# Patient Record
Sex: Female | Born: 1950 | Hispanic: No | Marital: Married | Smoking: Never smoker
Health system: Southern US, Community
[De-identification: ages and names within clinical notes are randomized; demographics above are authoritative.]

## PROBLEM LIST (undated history)

## (undated) DIAGNOSIS — D6862 Lupus anticoagulant syndrome: Secondary | ICD-10-CM

## (undated) DIAGNOSIS — R42 Dizziness and giddiness: Secondary | ICD-10-CM

## (undated) DIAGNOSIS — K469 Unspecified abdominal hernia without obstruction or gangrene: Secondary | ICD-10-CM

## (undated) DIAGNOSIS — K559 Vascular disorder of intestine, unspecified: Secondary | ICD-10-CM

## (undated) DIAGNOSIS — E119 Type 2 diabetes mellitus without complications: Secondary | ICD-10-CM

## (undated) DIAGNOSIS — E785 Hyperlipidemia, unspecified: Secondary | ICD-10-CM

## (undated) DIAGNOSIS — I749 Embolism and thrombosis of unspecified artery: Secondary | ICD-10-CM

## (undated) HISTORY — PX: NISSEN FUNDOPLICATION: SHX2091

## (undated) HISTORY — PX: ABDOMINAL SURGERY: SHX537

## (undated) HISTORY — PX: HERNIA REPAIR: SHX51

## (undated) HISTORY — PX: COLECTOMY WITH COLOSTOMY CREATION/HARTMANN PROCEDURE: SHX6598

## (undated) HISTORY — PX: COLOSTOMY TAKEDOWN: SHX5258

---

## 2017-12-24 ENCOUNTER — Inpatient Hospital Stay (HOSPITAL_COMMUNITY)
Admission: EM | Admit: 2017-12-24 | Discharge: 2017-12-27 | DRG: 194 | Disposition: A | Discharge status: Home / Self Care | Payer: Medicare Other | Attending: Internal Medicine | Admitting: Internal Medicine

## 2017-12-24 ENCOUNTER — Emergency Department (HOSPITAL_COMMUNITY): Payer: Medicare Other

## 2017-12-24 ENCOUNTER — Encounter (HOSPITAL_COMMUNITY): Payer: Self-pay | Admitting: *Deleted

## 2017-12-24 DIAGNOSIS — Z8719 Personal history of other diseases of the digestive system: Secondary | ICD-10-CM

## 2017-12-24 DIAGNOSIS — J154 Pneumonia due to other streptococci: Secondary | ICD-10-CM | POA: Diagnosis present

## 2017-12-24 DIAGNOSIS — Z7901 Long term (current) use of anticoagulants: Secondary | ICD-10-CM

## 2017-12-24 DIAGNOSIS — A419 Sepsis, unspecified organism: Secondary | ICD-10-CM

## 2017-12-24 DIAGNOSIS — E119 Type 2 diabetes mellitus without complications: Secondary | ICD-10-CM

## 2017-12-24 DIAGNOSIS — Z1630 Resistance to unspecified antimicrobial drugs: Secondary | ICD-10-CM | POA: Diagnosis present

## 2017-12-24 DIAGNOSIS — Z881 Allergy status to other antibiotic agents status: Secondary | ICD-10-CM

## 2017-12-24 DIAGNOSIS — K469 Unspecified abdominal hernia without obstruction or gangrene: Secondary | ICD-10-CM | POA: Diagnosis not present

## 2017-12-24 DIAGNOSIS — M546 Pain in thoracic spine: Secondary | ICD-10-CM | POA: Diagnosis not present

## 2017-12-24 DIAGNOSIS — E785 Hyperlipidemia, unspecified: Secondary | ICD-10-CM

## 2017-12-24 DIAGNOSIS — H9312 Tinnitus, left ear: Secondary | ICD-10-CM | POA: Diagnosis not present

## 2017-12-24 DIAGNOSIS — Z23 Encounter for immunization: Secondary | ICD-10-CM

## 2017-12-24 DIAGNOSIS — Z79899 Other long term (current) drug therapy: Secondary | ICD-10-CM

## 2017-12-24 DIAGNOSIS — D6861 Antiphospholipid syndrome: Secondary | ICD-10-CM | POA: Diagnosis present

## 2017-12-24 DIAGNOSIS — R740 Nonspecific elevation of levels of transaminase and lactic acid dehydrogenase [LDH]: Secondary | ICD-10-CM | POA: Diagnosis not present

## 2017-12-24 DIAGNOSIS — R091 Pleurisy: Secondary | ICD-10-CM | POA: Diagnosis not present

## 2017-12-24 DIAGNOSIS — Z8611 Personal history of tuberculosis: Secondary | ICD-10-CM | POA: Diagnosis not present

## 2017-12-24 DIAGNOSIS — J181 Lobar pneumonia, unspecified organism: Secondary | ICD-10-CM

## 2017-12-24 DIAGNOSIS — Z91040 Latex allergy status: Secondary | ICD-10-CM | POA: Diagnosis not present

## 2017-12-24 DIAGNOSIS — J918 Pleural effusion in other conditions classified elsewhere: Secondary | ICD-10-CM

## 2017-12-24 DIAGNOSIS — Z7984 Long term (current) use of oral hypoglycemic drugs: Secondary | ICD-10-CM | POA: Diagnosis not present

## 2017-12-24 DIAGNOSIS — Z885 Allergy status to narcotic agent status: Secondary | ICD-10-CM

## 2017-12-24 DIAGNOSIS — I10 Essential (primary) hypertension: Secondary | ICD-10-CM

## 2017-12-24 DIAGNOSIS — K559 Vascular disorder of intestine, unspecified: Secondary | ICD-10-CM | POA: Diagnosis present

## 2017-12-24 DIAGNOSIS — J189 Pneumonia, unspecified organism: Secondary | ICD-10-CM | POA: Insufficient documentation

## 2017-12-24 DIAGNOSIS — H9202 Otalgia, left ear: Secondary | ICD-10-CM | POA: Diagnosis not present

## 2017-12-24 HISTORY — DX: Embolism and thrombosis of unspecified artery: I74.9

## 2017-12-24 HISTORY — DX: Dizziness and giddiness: R42

## 2017-12-24 HISTORY — DX: Hyperlipidemia, unspecified: E78.5

## 2017-12-24 HISTORY — DX: Vascular disorder of intestine, unspecified: K55.9

## 2017-12-24 HISTORY — DX: Type 2 diabetes mellitus without complications: E11.9

## 2017-12-24 HISTORY — DX: Unspecified abdominal hernia without obstruction or gangrene: K46.9

## 2017-12-24 LAB — URINALYSIS, ROUTINE W REFLEX MICROSCOPIC
Bilirubin Urine: NEGATIVE
Glucose, UA: >=500 mg/dL — AB
Hgb urine dipstick: NEGATIVE
KETONES UR: 80 mg/dL — AB
Leukocytes, UA: NEGATIVE
Nitrite: NEGATIVE
PROTEIN: 100 mg/dL — AB
Specific Gravity, Urine: 1.028 (ref 1.005–1.030)
pH: 5 (ref 5.0–8.0)

## 2017-12-24 LAB — CBC WITH DIFFERENTIAL/PLATELET
BASOS PCT: 1 %
Basophils Absolute: 0.3 10*3/uL — ABNORMAL HIGH (ref 0.0–0.1)
EOS PCT: 0 %
Eosinophils Absolute: 0 10*3/uL (ref 0.0–0.7)
HCT: 42.2 % (ref 36.0–46.0)
HEMOGLOBIN: 13.1 g/dL (ref 12.0–15.0)
Lymphocytes Relative: 2 %
Lymphs Abs: 0.5 10*3/uL — ABNORMAL LOW (ref 0.7–4.0)
MCH: 24.7 pg — AB (ref 26.0–34.0)
MCHC: 31 g/dL (ref 30.0–36.0)
MCV: 79.5 fL (ref 78.0–100.0)
Monocytes Absolute: 0.8 10*3/uL (ref 0.1–1.0)
Monocytes Relative: 3 %
NEUTROS ABS: 24.2 10*3/uL — AB (ref 1.7–7.7)
NEUTROS PCT: 94 %
PLATELETS: 301 10*3/uL (ref 150–400)
RBC: 5.31 MIL/uL — ABNORMAL HIGH (ref 3.87–5.11)
RDW: 17.9 % — ABNORMAL HIGH (ref 11.5–15.5)
WBC: 25.8 10*3/uL — ABNORMAL HIGH (ref 4.0–10.5)

## 2017-12-24 LAB — COMPREHENSIVE METABOLIC PANEL
ALT: 93 U/L — AB (ref 0–44)
AST: 101 U/L — AB (ref 15–41)
Albumin: 2.9 g/dL — ABNORMAL LOW (ref 3.5–5.0)
Alkaline Phosphatase: 218 U/L — ABNORMAL HIGH (ref 38–126)
Anion gap: 13 (ref 5–15)
BUN: 10 mg/dL (ref 8–23)
CHLORIDE: 97 mmol/L — AB (ref 98–111)
CO2: 23 mmol/L (ref 22–32)
CREATININE: 1.01 mg/dL — AB (ref 0.44–1.00)
Calcium: 8.9 mg/dL (ref 8.9–10.3)
GFR calc Af Amer: >60 mL/min (ref >60)
GFR, EST NON AFRICAN AMERICAN: 56 mL/min — AB (ref >60)
Glucose, Bld: 145 mg/dL — ABNORMAL HIGH (ref 70–99)
Potassium: 4.2 mmol/L (ref 3.5–5.1)
Sodium: 133 mmol/L — ABNORMAL LOW (ref 135–145)
Total Bilirubin: 1.6 mg/dL — ABNORMAL HIGH (ref 0.3–1.2)
Total Protein: 7.3 g/dL (ref 6.5–8.1)

## 2017-12-24 LAB — I-STAT CG4 LACTIC ACID, ED: LACTIC ACID, VENOUS: 1.68 mmol/L (ref 0.5–1.9)

## 2017-12-24 LAB — GLUCOSE, CAPILLARY: Glucose-Capillary: 190 mg/dL — ABNORMAL HIGH (ref 70–99)

## 2017-12-24 LAB — PROTIME-INR
INR: 2.11
Prothrombin Time: 23.5 seconds — ABNORMAL HIGH (ref 11.4–15.2)

## 2017-12-24 LAB — HEMOGLOBIN A1C
HEMOGLOBIN A1C: 7.8 % — AB (ref 4.8–5.6)
Mean Plasma Glucose: 177.16 mg/dL

## 2017-12-24 LAB — CBG MONITORING, ED: Glucose-Capillary: 80 mg/dL (ref 70–99)

## 2017-12-24 LAB — D-DIMER, QUANTITATIVE (NOT AT ARMC): D DIMER QUANT: 5.7 ug{FEU}/mL — AB (ref 0.00–0.50)

## 2017-12-24 MED ORDER — SODIUM CHLORIDE 0.9 % IV SOLN
1.0000 g | Freq: Once | INTRAVENOUS | Status: AC
  Administered 2017-12-24: 1 g via INTRAVENOUS
  Filled 2017-12-24: qty 10

## 2017-12-24 MED ORDER — WARFARIN SODIUM 5 MG PO TABS
5.0000 mg | ORAL_TABLET | Freq: Once | ORAL | Status: AC
  Administered 2017-12-24: 5 mg via ORAL
  Filled 2017-12-24: qty 1

## 2017-12-24 MED ORDER — SODIUM CHLORIDE 0.9 % IV SOLN
1000.0000 mL | INTRAVENOUS | Status: DC
  Administered 2017-12-24: 1000 mL via INTRAVENOUS

## 2017-12-24 MED ORDER — PANTOPRAZOLE SODIUM 40 MG PO TBEC
40.0000 mg | DELAYED_RELEASE_TABLET | Freq: Every day | ORAL | Status: DC
  Administered 2017-12-25 – 2017-12-27 (×3): 40 mg via ORAL
  Filled 2017-12-24 (×3): qty 1

## 2017-12-24 MED ORDER — IOPAMIDOL (ISOVUE-370) INJECTION 76%
INTRAVENOUS | Status: AC
  Filled 2017-12-24: qty 100

## 2017-12-24 MED ORDER — PRAVASTATIN SODIUM 40 MG PO TABS
40.0000 mg | ORAL_TABLET | Freq: Every day | ORAL | Status: DC
  Administered 2017-12-24 – 2017-12-26 (×3): 40 mg via ORAL
  Filled 2017-12-24 (×3): qty 1

## 2017-12-24 MED ORDER — IOPAMIDOL (ISOVUE-370) INJECTION 76%
100.0000 mL | Freq: Once | INTRAVENOUS | Status: AC | PRN
  Administered 2017-12-24: 100 mL via INTRAVENOUS

## 2017-12-24 MED ORDER — VENLAFAXINE HCL ER 150 MG PO CP24
150.0000 mg | ORAL_CAPSULE | Freq: Every day | ORAL | Status: DC
  Administered 2017-12-25 – 2017-12-27 (×3): 150 mg via ORAL
  Filled 2017-12-24 (×3): qty 1

## 2017-12-24 MED ORDER — INSULIN ASPART 100 UNIT/ML ~~LOC~~ SOLN
0.0000 [IU] | Freq: Every day | SUBCUTANEOUS | Status: DC
  Administered 2017-12-25: 2 [IU] via SUBCUTANEOUS

## 2017-12-24 MED ORDER — LEVALBUTEROL HCL 0.63 MG/3ML IN NEBU: 0.6300 mg | INHALATION_SOLUTION | Freq: Four times a day (QID) | RESPIRATORY_TRACT | Status: DC | PRN

## 2017-12-24 MED ORDER — SODIUM CHLORIDE 0.9 % IV SOLN
500.0000 mg | Freq: Once | INTRAVENOUS | Status: AC
  Administered 2017-12-24: 500 mg via INTRAVENOUS
  Filled 2017-12-24: qty 500

## 2017-12-24 MED ORDER — LACTATED RINGERS IV BOLUS
1000.0000 mL | Freq: Once | INTRAVENOUS | Status: AC
  Administered 2017-12-24: 1000 mL via INTRAVENOUS

## 2017-12-24 MED ORDER — SODIUM CHLORIDE 0.9 % IV BOLUS
1000.0000 mL | Freq: Once | INTRAVENOUS | Status: AC
  Administered 2017-12-24: 1000 mL via INTRAVENOUS

## 2017-12-24 MED ORDER — SENNOSIDES-DOCUSATE SODIUM 8.6-50 MG PO TABS
1.0000 | ORAL_TABLET | Freq: Two times a day (BID) | ORAL | Status: DC
  Administered 2017-12-24 – 2017-12-26 (×5): 1 via ORAL
  Filled 2017-12-24 (×6): qty 1

## 2017-12-24 MED ORDER — ONDANSETRON HCL 4 MG/2ML IJ SOLN: 4.0000 mg | Freq: Four times a day (QID) | INTRAMUSCULAR | Status: DC | PRN

## 2017-12-24 MED ORDER — WARFARIN - PHARMACIST DOSING INPATIENT
Freq: Every day | Status: DC
  Administered 2017-12-25: 18:00:00

## 2017-12-24 MED ORDER — INSULIN ASPART 100 UNIT/ML ~~LOC~~ SOLN
0.0000 [IU] | Freq: Three times a day (TID) | SUBCUTANEOUS | Status: DC
  Administered 2017-12-25 (×2): 3 [IU] via SUBCUTANEOUS
  Administered 2017-12-26: 5 [IU] via SUBCUTANEOUS
  Administered 2017-12-26 – 2017-12-27 (×2): 3 [IU] via SUBCUTANEOUS
  Administered 2017-12-27: 7 [IU] via SUBCUTANEOUS

## 2017-12-24 MED ORDER — KETOROLAC TROMETHAMINE 30 MG/ML IJ SOLN
15.0000 mg | Freq: Once | INTRAMUSCULAR | Status: AC
  Administered 2017-12-24: 15 mg via INTRAMUSCULAR
  Filled 2017-12-24: qty 1

## 2017-12-24 MED ORDER — PIPERACILLIN-TAZOBACTAM 3.375 G IVPB
3.3750 g | Freq: Three times a day (TID) | INTRAVENOUS | Status: DC
  Administered 2017-12-24 – 2017-12-27 (×9): 3.375 g via INTRAVENOUS
  Filled 2017-12-24 (×10): qty 50

## 2017-12-24 MED ORDER — ONDANSETRON HCL 4 MG PO TABS: 4.0000 mg | ORAL_TABLET | Freq: Four times a day (QID) | ORAL | Status: DC | PRN

## 2017-12-24 MED ORDER — INSULIN DETEMIR 100 UNIT/ML ~~LOC~~ SOLN
10.0000 [IU] | Freq: Every day | SUBCUTANEOUS | Status: DC
  Administered 2017-12-24 – 2017-12-26 (×3): 10 [IU] via SUBCUTANEOUS
  Filled 2017-12-24 (×4): qty 0.1

## 2017-12-24 MED ORDER — BENZONATATE 100 MG PO CAPS
100.0000 mg | ORAL_CAPSULE | Freq: Once | ORAL | Status: AC
  Administered 2017-12-24: 100 mg via ORAL
  Filled 2017-12-24: qty 1

## 2017-12-24 MED ORDER — DEXTROSE 5 % IV SOLN
250.0000 mg | INTRAVENOUS | Status: DC
  Administered 2017-12-25 – 2017-12-27 (×3): 250 mg via INTRAVENOUS
  Filled 2017-12-24 (×3): qty 250

## 2017-12-24 MED ORDER — IBUPROFEN 800 MG PO TABS
800.0000 mg | ORAL_TABLET | Freq: Three times a day (TID) | ORAL | Status: DC
  Administered 2017-12-24 – 2017-12-27 (×7): 800 mg via ORAL
  Filled 2017-12-24 (×3): qty 1
  Filled 2017-12-24: qty 4
  Filled 2017-12-24 (×4): qty 1

## 2017-12-24 MED ORDER — ACETAMINOPHEN 325 MG PO TABS
650.0000 mg | ORAL_TABLET | Freq: Once | ORAL | Status: AC
  Administered 2017-12-24: 650 mg via ORAL
  Filled 2017-12-24: qty 2

## 2017-12-24 NOTE — ED Notes (Signed)
Pt at xray. Xray will transport pt back to B[facility]

## 2017-12-24 NOTE — ED Notes (Signed)
Pt reports last Tylenol, 1000 mg at 5 am today.

## 2017-12-24 NOTE — ED Notes (Signed)
This RN attempted IV, unable to obtain. Pt reports that she usually needs US IV, will place IV Team consult.

## 2017-12-24 NOTE — ED Provider Notes (Signed)
[facility] EMERGENCY DEPARTMENT Provider Note   CSN: [id number] Arrival date & time: 12/24/17  4098     History   Chief Complaint Chief Complaint  Patient presents with  . Cough  . Fever    HPI Terri Morales is a 67 y.o. female.  HPI  Terri Morales is a 67 y.o. 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  Patient states that she is not sure if symptoms related to this but she had a bad fall 6 days ago where she hit her head on the wall and fell onto her right shoulder and bruising her shoulder, bilateral legs, head.  She denies loss of consciousness.  She states she was in minimal pain at that time, but pain got worse the next day.  She states pain was "everywhere."  She also states that the day after, approximately 4 days ago she started running a fever.  The day before that she drove from [place] here which is approximately 12 to 13-hour drive.  She states that she has also now developed cough and congestion in the last 2 days.  She has been taking Tylenol for her fever and pain.  She states that currently her pain is in her chest, abdomen, back.  She states she has been weak, she has not been out of the house due to not feeling well.  Did not take any of her morning medications but did take some Tylenol.  She states fever up to 102 at home.   Past Medical History:  Diagnosis Date  . Abdominal hernia   . Diabetes mellitus without complication (HCC)   . Embolism (HCC)   . Hyperlipemia   . Ischemic colitis (HCC)   . Vertigo     There are no active problems to display for this patient.   Past Surgical History:  Procedure Laterality Date  . ABDOMINAL SURGERY       OB History   None      Home Medications    Prior to Admission medications   Not on File    Family History History reviewed. No pertinent family history.  Social  History Social History   Tobacco Use  . Smoking status: Never Smoker  . Smokeless tobacco: Never Used  Substance Use Topics  . Alcohol use: Not on file  . Drug use: Not on file     Allergies   Morphine and related   Review of Systems Review of Systems  Constitutional: Positive for chills and fever.  Respiratory: Positive for cough, chest tightness and shortness of breath.   Cardiovascular: Positive for chest pain. Negative for palpitations and leg swelling.  Gastrointestinal: Positive for abdominal pain. Negative for diarrhea, nausea and vomiting.  Genitourinary: Negative for dysuria, flank pain, pelvic pain, vaginal bleeding, vaginal discharge and vaginal pain.  Musculoskeletal: Positive for arthralgias, back pain and myalgias. Negative for neck pain and neck stiffness.  Skin: Negative for rash.  Neurological: Positive for headaches. Negative for dizziness and weakness.  All other systems reviewed and are negative.    Physical Exam Updated Vital Signs BP (!) 103/58 (BP Location: Right Arm)   Pulse (!) 122   Temp 98.4 F (36.9 C) (Oral)   Resp 20   SpO2 95%   Physical Exam  Constitutional: She appears well-developed and well-nourished. No distress.  HENT:  Head: Normocephalic.  Eyes: Conjunctivae are normal.  Neck: Neck supple.  Cardiovascular: Normal  rate, regular rhythm and normal heart sounds.  Pulmonary/Chest: Effort normal and breath sounds normal. No respiratory distress. She has no wheezes. She has no rales.  Abdominal: Soft. Bowel sounds are normal. She exhibits no distension. There is no tenderness. There is no rebound.  Musculoskeletal: She exhibits no edema.  Neurological: She is alert.  Skin: Skin is warm and dry.  Multiple contusions, one contusion to the right anterior shoulder.  Contusions to bilateral shins.  No bony tenderness.  Psychiatric: She has a normal mood and affect. Her behavior is normal.  Nursing note and vitals reviewed.    ED  Treatments / Results  Labs (all labs ordered are listed, but only abnormal results are displayed) Labs Reviewed  COMPREHENSIVE METABOLIC PANEL - Abnormal; Notable for the following components:      Result Value   Sodium 133 (*)    Chloride 97 (*)    Glucose, Bld 145 (*)    Creatinine, Ser 1.01 (*)    Albumin 2.9 (*)    AST 101 (*)    ALT 93 (*)    Alkaline Phosphatase 218 (*)    Total Bilirubin 1.6 (*)    GFR calc non Af Amer 56 (*)    All other components within normal limits  CBC WITH DIFFERENTIAL/PLATELET - Abnormal; Notable for the following components:   WBC 25.8 (*)    RBC 5.31 (*)    MCH 24.7 (*)    RDW 17.9 (*)    Neutro Abs 24.2 (*)    Lymphs Abs 0.5 (*)    Basophils Absolute 0.3 (*)    All other components within normal limits  URINALYSIS, ROUTINE W REFLEX MICROSCOPIC - Abnormal; Notable for the following components:   APPearance HAZY (*)    Glucose, UA >=500 (*)    Ketones, ur 80 (*)    Protein, ur 100 (*)    Bacteria, UA RARE (*)    All other components within normal limits  PROTIME-INR - Abnormal; Notable for the following components:   Prothrombin Time 23.5 (*)    All other components within normal limits  D-DIMER, QUANTITATIVE (NOT AT [facility]) - Abnormal; Notable for the following components:   D-Dimer, Quant 5.70 (*)    All other components within normal limits  CULTURE, BLOOD (ROUTINE X 2)  CULTURE, BLOOD (ROUTINE X 2)  I-STAT CG4 LACTIC ACID, ED    EKG EKG Interpretation  Date/Time:  Friday December 24 2017 12:05:46 EDT Ventricular Rate:  114 PR Interval:    QRS Duration: 85 QT Interval:  317 QTC Calculation: 437 R Axis:   70 Text Interpretation:  Sinus tachycardia Borderline repolarization abnormality agree. no acute ischemic changes Confirmed by Arby Barrette [id number]) on 12/24/2017 3:10:11 PM   Radiology Dg Chest 2 View  Result Date: 12/24/2017 CLINICAL DATA:  66 year old female with a history of cough and chest pain EXAM: CHEST - 2 VIEW  COMPARISON:  None. FINDINGS: Cardiomediastinal silhouette within normal limits. No evidence of central vascular congestion. Airspace opacity of the left posterior lower lobe with air bronchograms. No pleural effusion. No pneumothorax. No displaced fracture. IMPRESSION: Lobar pneumonia of the left lower lobe Electronically Signed   By: Gilmer Mor D.O.   On: 12/24/2017 09:42   Ct Angio Chest Pe W And/or Wo Contrast  Result Date: 12/24/2017 CLINICAL DATA:  Pneumonia.  Elevated D-dimer. EXAM: CT ANGIOGRAPHY CHEST WITH CONTRAST TECHNIQUE: Multidetector CT imaging of the chest was performed using the standard protocol during bolus administration of intravenous contrast.  Multiplanar CT image reconstructions and MIPs were obtained to evaluate the vascular anatomy. CONTRAST:  ISOVUE-370 IOPAMIDOL (ISOVUE-370) INJECTION 76%, <See Chart> ISOVUE-370 IOPAMIDOL (ISOVUE-370) INJECTION 76% COMPARISON:  Chest x-ray from same day. FINDINGS: Cardiovascular: Satisfactory opacification of the pulmonary arteries to the segmental level. No evidence of pulmonary embolism. Normal heart size. No pericardial effusion. Normal caliber thoracic aorta. Thoracic aortic atherosclerosis. Mediastinum/Nodes: Enlarged para-aortic and subcarinal lymph nodes measuring up to 1.5 cm in short axis, favored reactive. No axillary or hilar lymphadenopathy. The thyroid gland, trachea, and esophagus demonstrate no significant findings. Lungs/Pleura: Patchy consolidation involving the majority of the left lower lobe. Moderate left pleural effusion. There are a few ground-glass densities in the right lower lobe. No pneumothorax. No suspicious pulmonary nodule. Upper Abdomen: No acute abnormality. Musculoskeletal: No chest wall abnormality. No acute or significant osseous findings. Review of the MIP images confirms the above findings. IMPRESSION: 1. Left lower lobar pneumonia. Mild bronchopneumonia of the right lower lobe. Followup PA and lateral chest  X-ray is recommended in 3-4 weeks following trial of antibiotic therapy to ensure resolution. 2.  No evidence of pulmonary embolism. 3. Moderate left pleural effusion. 4.  Aortic atherosclerosis (ICD10-I70.0). Electronically Signed   By: Obie Dredge M.D.   On: 12/24/2017 14:22    Procedures .Critical Care Performed by: Jaynie Crumble, PA-C Authorized by: Jaynie Crumble, PA-C   Critical care provider statement:    Critical care time (minutes):  45   Critical care was necessary to treat or prevent imminent or life-threatening deterioration of the following conditions:  Respiratory failure and sepsis   Critical care was time spent personally by me on the following activities:  Discussions with consultants, evaluation of patient's response to treatment, examination of patient, ordering and performing treatments and interventions, ordering and review of laboratory studies, ordering and review of radiographic studies, pulse oximetry, re-evaluation of patient's condition, obtaining history from patient or surrogate and review of old charts   I assumed direction of critical care for this patient from another provider in my specialty: no     (including critical care time)   Medications Ordered in ED Medications  sodium chloride 0.9 % bolus 1,000 mL (has no administration in time range)     Initial Impression / Assessment and Plan / ED Course  I have reviewed the triage vital signs and the nursing notes.  Pertinent labs & imaging results that were available during my care of the patient were reviewed by me and considered in my medical decision making (see chart for details).     Patient with cough, congestion, fevers up to 102 at home, body aches, chest pain for the last 4 days.  Also had travel with 13 hours by car 5 days ago.  History of ischemic colitis on Coumadin.  Will check labs including INR and get a d-dimer.  Will get a chest x-ray.  I will start IV fluids.  Patient is  tachycardic, however normal blood pressure, temperature, respiratory rate and oxygen saturation.  Does not meet Sirs criteria.  I will however draw blood cultures given that she did have a fever of 102 at home.  I will order her some Tessalon for cough.  Her lungs are clear.  Will monitor.  11:09 AM Patient is now tachypneic and febrile at 100.9.  She meets sirs criteria.  Her white blood cell count is 25.8.  She already had blood cultures drawn, I will add additional fluids and antibiotics to cover for pneumonia.  Chest  x-ray does show pneumonia.  Her lactic acid is normal at 1.68.  Sepsis - Repeat Assessment  Performed at:    2:42 PM   Vitals     Blood pressure (!) 113/36, pulse (!) 113, temperature 99.9 F (37.7 C), temperature source Oral, resp. rate (!) 25, height 4\' 10"  (1.473 m), weight 74.4 kg, SpO2 93 %.  Heart:     Regular rate and rhythm and Tachycardic  Lungs:    Wheezing  Capillary Refill:   <2 sec  Peripheral Pulse:   Radial pulse palpable and Dorsalis pedis pulse  palpable  Skin:     Dry  2:43 PM Patient continues to improve.  Heart rate is in the low 100s.  States that she is feeling slightly improved.  Her CT angios negative for PE.  I spoke with admitting team, teaching service, they will come by and admit patient.  I did discuss with them whether they want to change her antibiotics since she now is stating that she was in the hospital in June for small bowel obstruction.  They will make that decision once they see her.  Vitals:   12/24/17 1256 12/24/17 1300 12/24/17 1315 12/24/17 1415  BP:  (!) 132/54 (!) 122/54 (!) 113/36  Pulse:  (!) 117 (!) 113 (!) 113  Resp:  (!) 26 (!) 25 (!) 25  Temp: 99.9 F (37.7 C)     TempSrc: Oral     SpO2:  93% 94% 93%  Weight:      Height:         Final Clinical Impressions(s) / ED Diagnoses   Final diagnoses:  Sepsis, due to unspecified organism [facility](HCC)  Community acquired pneumonia of left lower lobe of lung [facility](HCC)    ED  Discharge Orders    None       Jaynie CrumbleKirichenko, Joleene Burnham, PA-C 12/24/17 1524    Arby BarrettePfeiffer, Marcy, MD 12/27/17 [phone]

## 2017-12-24 NOTE — ED Triage Notes (Signed)
Pt in c/o cough, congestion and fever that started a few days ago, pt also reports long car ride of 10 hours on Monday, no distress in triage

## 2017-12-24 NOTE — H&P (Addendum)
Date: 12/24/2017               Patient Name:  Terri Morales MRN: [id number]  DOB: Jan 25, 1951 Age / Sex: 67 y.o., female   PCP: Patient, No Pcp Per         Medical Service: Internal Medicine Teaching Service         Attending Physician: Dr. Doneen Poisson, MD    First Contact: Dr. Karilyn Cota, Gerell Fortson Pager: [phone]  Second Contact: Dr. Levora Dredge Pager: [phone]       After Hours (After 5p/  First Contact Pager: [phone]  weekends / holidays): Second Contact Pager: [phone]   Chief Complaint: Cough with fevers  History of Present Illness:   Terri Morales is a 67 y.o female with a PMHx of HTN, HLD, DM, ischemic colitis on coumadin, and abdominal hernias presenting with a five day history of productive cough with fevers.  She started having fevers, chills and cough on Monday with the highest reported temperature of 102 F. She started taking tylenol but noticed the fevers kept returning and symptoms progressed throughout the week. She also reported centralized chest pain that worsened with breathing. She reported her cough was clear in color, denied any trouble breathing or swallowing. Denied N/V. She also reported left sided back pain on her ribs. She said this started in the hospital and worsened when she was moved from the bed to the CT scanner. She said it worsened with movement.  She is visiting her son from [place] and planned to stay til 12/29/17. She drove down from [place], ~12-13 hour drive and noticed her symptoms started the next day. 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 She denied any recent traveling or sick contacts; her mother in law has a chronic cough but is not sick. She also denied any prior history of COPD or asthma, never smoked. She reported she was recently admitted to  the hospital in June of this year due to an abdominal hernia. She denied receiving any antibiotics at that time.   In the ED, she had a BP 103/58, pulse 122, RR 20, fever of 100.9, and WBC 25.8. Lactic acid 1.68, INR 2.11, PT 23.5, D-Dimer 5.70. CXR showed left lower lobe pneumonia. CT angio chest showed no evidence of PE, left lower lobe pneumonia, mild bronchopneumonia of the right lower lobe, and moderate left pleural effusion. Patient was started on azithromycin and ceftriaxone and admitted to the IMT[facility] for further management.   Meds:  Current Meds  Medication Sig  . acetaminophen (TYLENOL) 500 MG tablet Take 1,000 mg by mouth every 6 (six) hours as needed for mild pain.  . Cholecalciferol (VITAMIN D3) 5000 units TABS Take 1,000 Units by mouth daily.  [name] gabapentin (NEURONTIN) 100 MG capsule Take 200 mg by mouth at bedtime.  [name] JANUVIA 100 MG tablet Take 100 mg by mouth daily.  [name] JARDIANCE 10 MG TABS tablet Take 10 mg by mouth daily.  . meclizine (ANTIVERT) 12.5 MG tablet Take 12.5 mg by mouth 3 (three) times daily as needed for dizziness.  . Melatonin 10 MG TABS Take 10 mg by mouth at bedtime as needed for sleep.  . Multiple Vitamin (MULTI-VITAMINS) TABS Take 1 tablet by mouth daily.  . Nutritional Supplements (DHEA PO) Take  1 tablet by mouth daily.  . ondansetron (ZOFRAN-ODT) 4 MG disintegrating tablet Take 4 mg by mouth every 6 (six) hours as needed for nausea/vomiting.  . pravastatin (PRAVACHOL) 40 MG tablet Take 40 mg by mouth at bedtime.  . senna (SENOKOT) 8.6 MG tablet Take 1 tablet by mouth at bedtime as needed for constipation.  [name] venlafaxine XR (EFFEXOR-XR) 150 MG 24 hr capsule Take 150 mg by mouth daily.  [name] warfarin (COUMADIN) 2.5 MG tablet Take 5 mg by mouth every evening. Current dose    Current Facility-Administered Medications (Endocrine & Metabolic):  .  insulin aspart (novoLOG) injection 0-15 Units .  insulin aspart (novoLOG) injection 0-5 Units .  insulin detemir  (LEVEMIR) injection 10 Units  Current Outpatient Medications (Endocrine & Metabolic):  [name]  JANUVIA 100 MG tablet, Take 100 mg by mouth daily. [name]  JARDIANCE 10 MG TABS tablet, Take 10 mg by mouth daily.  Current Facility-Administered Medications (Cardiovascular):  .  pravastatin (PRAVACHOL) tablet 40 mg  Current Outpatient Medications (Cardiovascular):  .  pravastatin (PRAVACHOL) 40 MG tablet, Take 40 mg by mouth at bedtime.    Current Facility-Administered Medications (Analgesics):  .  ibuprofen (ADVIL,MOTRIN) tablet 800 mg .  ketorolac (TORADOL) 30 MG/ML injection 15 mg  Current Outpatient Medications (Analgesics):  .  acetaminophen (TYLENOL) 500 MG tablet, Take 1,000 mg by mouth every 6 (six) hours as needed for mild pain.  Current Facility-Administered Medications (Hematological):  .  warfarin (COUMADIN) tablet 5 mg  Current Outpatient Medications (Hematological):  .  warfarin (COUMADIN) 2.5 MG tablet, Take 5 mg by mouth every evening. Current dose  Current Facility-Administered Medications (Other):  .  0.9 %  sodium chloride infusion .  [START ON 12/25/2017] azithromycin (ZITHROMAX) 250 mg in dextrose 5 % 125 mL IVPB .  iopamidol (ISOVUE-370) 76 % injection .  ondansetron (ZOFRAN) tablet 4 mg **OR** ondansetron (ZOFRAN) injection 4 mg .  pantoprazole (PROTONIX) EC tablet 40 mg .  piperacillin-tazobactam (ZOSYN) IVPB 3.375 g .  senna-docusate (Senokot-S) tablet 1 tablet .  [START ON 12/25/2017] venlafaxine XR (EFFEXOR-XR) 24 hr capsule 150 mg .  Warfarin - Pharmacist Dosing Inpatient  Current Outpatient Medications (Other):  [name]  Cholecalciferol (VITAMIN D3) 5000 units TABS, Take 1,000 Units by mouth daily. [name]  gabapentin (NEURONTIN) 100 MG capsule, Take 200 mg by mouth at bedtime. .  meclizine (ANTIVERT) 12.5 MG tablet, Take 12.5 mg by mouth 3 (three) times daily as needed for dizziness. .  Melatonin 10 MG TABS, Take 10 mg by mouth at bedtime as needed for sleep. .  Multiple  Vitamin (MULTI-VITAMINS) TABS, Take 1 tablet by mouth daily. .  Nutritional Supplements (DHEA PO)*, Take 1 tablet by mouth daily. .  ondansetron (ZOFRAN-ODT) 4 MG disintegrating tablet, Take 4 mg by mouth every 6 (six) hours as needed for nausea/vomiting. .  senna (SENOKOT) 8.6 MG tablet, Take 1 tablet by mouth at bedtime as needed for constipation. [name]  venlafaxine XR (EFFEXOR-XR) 150 MG 24 hr capsule, Take 150 mg by mouth daily. * These medications belong to multiple therapeutic classes and are listed under each applicable group.  Allergies: Allergies as of 12/24/2017 - Review Complete 12/24/2017  Allergen Reaction Noted  . Clarithromycin Other (See Comments) 08/01/2013  . Morphine and related Hives 12/24/2017  . Latex Rash 08/15/2014   Past Medical History:  Diagnosis Date  . Abdominal hernia   . Diabetes mellitus without complication (HCC)   . Embolism (HCC)   . Hyperlipemia   .  Ischemic colitis (HCC)   . Vertigo     Family History:   History reviewed. No pertinent family history.  Social History:   Social History   Socioeconomic History  . Marital status: Married    Spouse name: Not on file  . Number of children: Not on file  . Years of education: Not on file  . Highest education level: Not on file  Occupational History  . Not on file  Social Needs  . Financial resource strain: Not on file  . Food insecurity:    Worry: Not on file    Inability: Not on file  . Transportation needs:    Medical: Not on file    Non-medical: Not on file  Tobacco Use  . Smoking status: Never Smoker  . Smokeless tobacco: Never Used  Substance and Sexual Activity  . Alcohol use: Not on file  . Drug use: Not on file  . Sexual activity: Not on file  Lifestyle  . Physical activity:    Days per week: Not on file    Minutes per session: Not on file  . Stress: Not on file  Relationships  . Social connections:    Talks on phone: Not on file    Gets together: Not on file    Attends  religious service: Not on file    Active member of club or organization: Not on file    Attends meetings of clubs or organizations: Not on file    Relationship status: Not on file  . Intimate partner violence:    Fear of current or ex partner: Not on file    Emotionally abused: Not on file    Physically abused: Not on file    Forced sexual activity: Not on file  Other Topics Concern  . Not on file  Social History Narrative  . Not on file    Review of Systems: A complete ROS was negative except as per HPI.   Physical Exam: Blood pressure 128/61, pulse (!) 106, temperature 99.9 F (37.7 C), temperature source Oral, resp. rate (!) 22, height 4\' 10"  (1.473 m), weight 74.4 kg, SpO2 93 %.  Physical Exam  Constitutional: She is oriented to person, place, and time. She appears distressed.  Patient screamed in pain due to lower left sided back pain when she tried to turn   Cardiovascular: Normal rate, regular rhythm and normal heart sounds.  No murmur heard. Musculoskeletal: She exhibits no edema or tenderness.  Neurological: She is alert and oriented to person, place, and time.  Skin: Skin is warm and dry. She is not diaphoretic.  Pulmonary: effort normal, left lower lobe sided crackles, wheezing  EKG: personally reviewed my interpretation is sinus tachycardia, normal axis  CXR: personally reviewed my interpretation is left lower lobe pneumonia.   Assessment & Plan by Problem: Active Problems:   Lobar pneumonia (HCC)   Parapneumonic effusion  Terri Morales is a 67 y.o female with a PMHx of HTN, HLD, DM, ischemic colitis on coumadin, and abdominal hernias presenting with a five day history of productive cough with fevers. CXR and CTA illustrated left lower lobe pneumonia and left sided pleural effusion.   Left lobar pneumonia Simple parapneumonic effusion Patient presented with 5 day history of fevers, cough, chills and pleuritic chest pain. History of TB exposure; however unlikely  TB pneumonia due to location and presentation. CXR and CTA findings illustrated left lower lobe pneumonia and left sided pleural effusion. Case discussed with IR. After reviewing imaging, recommended  no further intervention/sampling at this time. Likely a simple parapneumonic effusion. Left lower pneumonia most likely CAP with a simple parapneumonic effusion. Transaminitis could be suggestive of mycoplasma pneumonia.  Will continue to cover for anaerobes and atypicals. Zoysn due to recent hospitalization and risk for MDR.  -- continue azithromycin and zoysn  -- consider sampling of pleural effusion if patient does not clinically improve -- monitor vitals, WBC  -- pain control: Toradol injection x1, ibuprofen 800 mg q8h  -- f/u cmp  Ischemic Colitis Patient reported a history of ischemic colitis on warfarin. She reported numerous surgeries and recent diagnosis of multiple abdominal hernias requiring surgery in June of this year. Currently stable. INR 2.11 -- warfarin home dose 5 mg qd, will be dosed per pharmacy -- will monitor for s/sx of bleeding  -- consider holding if any surgical intervention is necessary   DM Home medications include jardiance 10 mg qd and januvia 100 mg qd. UA showed ketones. Glucose 145. No anion gap.  -- start insulin detemir 10 units at bedtime with SSI-moderate  -- monitor CBGs -- f/u A1c  HLD -- continue home medication pravastatin 40 mg qd  Diet: Carb Modified VTE prophylaxis: Pt on Warfarin Code: Full  Dispo: Admit patient to Inpatient with expected length of stay greater than 2 midnights.  SignedJaci Standard: Wyonia Fontanella N, DO 12/24/2017, 5:09 PM  Pager: [phone]

## 2017-12-24 NOTE — Progress Notes (Addendum)
Pharmacy Antibiotic Note  Terri Morales is a 67 y.o. female admitted on 12/24/2017 with pneumonia.  Pharmacy has been consulted for Zosyn dosing. Received 1x doses of CTX/azithro in the ED. SCr 1.01 on admit.  Plan: Zosyn 3.375g IV q8h (4h inf) Azithro per MD Monitor clinical progress, c/s, renal function F/u de-escalation plan/LOT Rx will s/o consult and follow peripherally  Height: 4\' 10"  (147.3 cm) Weight: 164 lb (74.4 kg) IBW/kg (Calculated) : 40.9  Temp (24hrs), Avg:99.7 F (37.6 C), Min:98.4 F (36.9 C), Max:100.9 F (38.3 C)  Recent Labs  Lab 12/24/17 0900 12/24/17 0909  WBC 25.8*  --   CREATININE 1.01*  --   LATICACIDVEN  --  1.68    Estimated Creatinine Clearance: 46.3 mL/min (A) (by C-G formula based on SCr of 1.01 mg/dL (H)).    Allergies  Allergen Reactions  . Morphine And Related    Babs BertinHaley Brittany Osier, PharmD, BCPS Clinical Pharmacist 12/24/2017 3:50 PM

## 2017-12-24 NOTE — ED Notes (Signed)
Pt states that she has had a fever at home with highest being 102, reports taking tylenol, states that her fever comes back every 4-6 hours after taking.

## 2017-12-24 NOTE — ED Provider Notes (Signed)
Medical screening examination/treatment/procedure(s) were conducted as a shared visit with non-physician practitioner(s) and myself.  I personally evaluated the patient during the encounter.  None Patient has developed cough with chest congestion and fever.  Left-sided chest pain with cough.  She is denying shortness of breath.  Patient is alert and appropriate.  She has tachypnea.  Mental status clear.  Heart is regular and tachycardic.  Crackles left lower lung fields.  Good airflow right lung field.  Abdomen soft and nontender.  No peripheral edema.  Patient is alert and appropriate but tachycardic, febrile with leukocytosis and chest x-ray confirmed pneumonia.  Sepsis protocol initiated.  I agree with plan of management.   Arby BarrettePfeiffer, Tajae Rybicki, MD 12/24/17 1106

## 2017-12-24 NOTE — ED Notes (Signed)
Pt is on purewick and hooked to suction canister. Pt aware that urine sample is needed.

## 2017-12-24 NOTE — Progress Notes (Addendum)
ANTICOAGULATION CONSULT NOTE  Pharmacy Consult for warfarin Indication: Antiphospholipid syndrome and Hx of VTE   Assessment: 66 yof on warfarin PTA for Antiphospholipid syndrome and Hx of VTE admitted with PNA. Pharmacy consulted to dose warfarin while inpatient. Noted, received 1x doses of ceftriaxone/azithro in the ED and pt to continue on Zosyn.   Azithro expected to cause INR to bump - planning to continue for now per discussion with MD. INR therapeutic at 2.11 on admit. CBC wnl. No bleed documented. D-dimer elevated at 5.71 but CTA negative for PE.  PTA warfarin dose: 5mg  daily (last dose 8/29 PTA)  Goal of Therapy:  INR 2-3 Monitor platelets by anticoagulation protocol: Yes   Plan:  Warfarin 5mg  PO x 1 dose tonight - watch INR, may need lower dose tomorrow due to azithro DDI Daily INR Monitor CBC, s/sx bleeding, DDI Paged MD to consider d/c scheduled high-dose ibuprofen due to bleed risk  Babs BertinHaley Nyima Vanacker, PharmD, BCPS Clinical Pharmacist 12/24/2017 3:54 PM

## 2017-12-24 NOTE — ED Notes (Signed)
IV TEAM AT BEDSIDE 

## 2017-12-25 ENCOUNTER — Other Ambulatory Visit: Payer: Self-pay

## 2017-12-25 DIAGNOSIS — H9202 Otalgia, left ear: Secondary | ICD-10-CM

## 2017-12-25 DIAGNOSIS — D6861 Antiphospholipid syndrome: Secondary | ICD-10-CM

## 2017-12-25 DIAGNOSIS — H9312 Tinnitus, left ear: Secondary | ICD-10-CM

## 2017-12-25 LAB — MRSA PCR SCREENING: MRSA BY PCR: NEGATIVE

## 2017-12-25 LAB — COMPREHENSIVE METABOLIC PANEL
ALBUMIN: 2.3 g/dL — AB (ref 3.5–5.0)
ALT: 68 U/L — ABNORMAL HIGH (ref 0–44)
AST: 81 U/L — ABNORMAL HIGH (ref 15–41)
Alkaline Phosphatase: 200 U/L — ABNORMAL HIGH (ref 38–126)
Anion gap: 10 (ref 5–15)
BUN: 13 mg/dL (ref 8–23)
CO2: 19 mmol/L — ABNORMAL LOW (ref 22–32)
CREATININE: 0.83 mg/dL (ref 0.44–1.00)
Calcium: 8.4 mg/dL — ABNORMAL LOW (ref 8.9–10.3)
Chloride: 107 mmol/L (ref 98–111)
GFR calc Af Amer: >60 mL/min (ref >60)
Glucose, Bld: 144 mg/dL — ABNORMAL HIGH (ref 70–99)
Potassium: 3.5 mmol/L (ref 3.5–5.1)
SODIUM: 136 mmol/L (ref 135–145)
TOTAL PROTEIN: 6.1 g/dL — AB (ref 6.5–8.1)
Total Bilirubin: 1.1 mg/dL (ref 0.3–1.2)

## 2017-12-25 LAB — CBC
HCT: 38.4 % (ref 36.0–46.0)
Hemoglobin: 12.1 g/dL (ref 12.0–15.0)
MCH: 24.3 pg — ABNORMAL LOW (ref 26.0–34.0)
MCHC: 31.5 g/dL (ref 30.0–36.0)
MCV: 77.1 fL — AB (ref 78.0–100.0)
PLATELETS: 256 10*3/uL (ref 150–400)
RBC: 4.98 MIL/uL (ref 3.87–5.11)
RDW: 17.9 % — AB (ref 11.5–15.5)
WBC: 16.9 10*3/uL — AB (ref 4.0–10.5)

## 2017-12-25 LAB — PROTIME-INR
INR: 3.2
Prothrombin Time: 32.5 seconds — ABNORMAL HIGH (ref 11.4–15.2)

## 2017-12-25 LAB — GLUCOSE, CAPILLARY
Glucose-Capillary: 114 mg/dL — ABNORMAL HIGH (ref 70–99)
Glucose-Capillary: 184 mg/dL — ABNORMAL HIGH (ref 70–99)
Glucose-Capillary: 199 mg/dL — ABNORMAL HIGH (ref 70–99)
Glucose-Capillary: 227 mg/dL — ABNORMAL HIGH (ref 70–99)

## 2017-12-25 LAB — HIV ANTIBODY (ROUTINE TESTING W REFLEX): HIV SCREEN 4TH GENERATION: NONREACTIVE

## 2017-12-25 MED ORDER — GUAIFENESIN-DM 100-10 MG/5ML PO SYRP
5.0000 mL | ORAL_SOLUTION | Freq: Every evening | ORAL | Status: DC | PRN
  Administered 2017-12-25 – 2017-12-26 (×2): 5 mL via ORAL
  Filled 2017-12-25 (×2): qty 5

## 2017-12-25 MED ORDER — WARFARIN SODIUM 2 MG PO TABS
2.0000 mg | ORAL_TABLET | Freq: Once | ORAL | Status: AC
  Administered 2017-12-25: 2 mg via ORAL
  Filled 2017-12-25: qty 1

## 2017-12-25 NOTE — Progress Notes (Signed)
ANTICOAGULATION CONSULT NOTE - Follow-Up Consult  Pharmacy Consult for Warfarin Indication: Hx VTE + Antiphospholipid Syndrome  Allergies  Allergen Reactions  . Clarithromycin Other (See Comments)    unsure  . Morphine And Related Hives  . Latex Rash    Patient Measurements: Height: 4\' 10"  (147.3 cm) Weight: 164 lb 12.8 oz (74.8 kg) IBW/kg (Calculated) : 40.9  Vital Signs: Temp: 97.5 F (36.4 C) (08/31 0809) Temp Source: Oral (08/31 0529) BP: 121/68 (08/31 0809) Pulse Rate: 94 (08/31 0809)  Labs: Recent Labs    12/24/17 0900 12/24/17 0925 12/25/17 0450  HGB 13.1  --  12.1  HCT 42.2  --  38.4  PLT 301  --  256  LABPROT  --  23.5* 32.5*  INR  --  2.11 3.20  CREATININE 1.01*  --  0.83    Estimated Creatinine Clearance: 56.6 mL/min (by C-G formula based on SCr of 0.83 mg/dL).   Medical History: Past Medical History:  Diagnosis Date  . Abdominal hernia   . Diabetes mellitus without complication (HCC)   . Embolism (HCC)   . Hyperlipemia   . Ischemic colitis (HCC)   . Vertigo      Assessment: [age] yoF with hx antiphospholipid syndrome and VTE on warfarin PTA admitted with PNA. INR therapeutic on admit now supratherapeutic at 3.20 after giving home dose last night, likely due to antibiotics and acute illness. CBC stable, no S/Sx bleeding noted. Pt will require reduced dosing for now.  Home Warfarin Dose = 5mg  daily  Goal of Therapy:  INR 2-3 Monitor platelets by anticoagulation protocol: Yes   Plan:  -Warfarin 2mg  PO x1 tonight -Daily protime  Fredonia HighlandMichael Damariz Paganelli, PharmD, BCPS Clinical Pharmacist [phone] Please check AMION for all [facility] numbers 12/25/2017

## 2017-12-25 NOTE — Plan of Care (Signed)
  Problem: Activity: Goal: Risk for activity intolerance will decrease Outcome: Progressing   Problem: Education: Goal: Knowledge of General Education information will improve Description Including pain rating scale, medication(s)/side effects and non-pharmacologic comfort measures Outcome: Completed/Met

## 2017-12-25 NOTE — Progress Notes (Signed)
Internal Medicine Attending  Date: 12/25/2017  Patient name: Zenovia JordanSofia Fehnel Medical record number: [id number] Date of birth: 05/07/1950 Age: 67 y.o. Gender: female  I saw and evaluated the patient. I reviewed the resident's note by Dr. Karilyn Cotaehman and I agree with the resident's findings and plans as documented in her progress note.  When seen on rounds this morning Ms. Leticia ClasRivera was feeling slightly improved with regards to her left posterior back pain and breathing. She felt the ibuprofen was effective. She still has a cough and this does exacerbate her pleuritic pain. She's had a nice response to the antibiotics with regards to her leukocytosis as it has dropped from 25,000 to 16,000. We will continue the Zosyn and azithromycin and follow-up on the blood cultures. We will also continue the scheduled nonsteroidal anti-inflammatories for associated pleuritic pain. Finally, we will try to help her rest at night by providing her with cough syrup daily at bedtime. That said, she still has a significant systemic infection and will require inpatient care for at least the next 48-72 hours.

## 2017-12-25 NOTE — Progress Notes (Addendum)
   Subjective: Ms. Terri Morales reported feeling better today. She did not sleep well because her cough kept waking her up and she is still having pleuritic chest pain with some improvement. She said her left lower back pain has also improved some. She reported left ear pain with ringing that she noticed started last night. She said she has a history of ringing in her left ear but the pain is new. She said she had a recent sinus infection that she was treated for. She denied hearing loss or fluid/pus draining. She said she felt some chills overnight. Denied fever or SOB. All questions and concerns were addressed.   Objective:  Vital signs in last 24 hours: Vitals:   12/24/17 1930 12/24/17 2008 12/25/17 0015 12/25/17 0529  BP: (!) 118/54 (!) 142/57 (!) 126/58 (!) 118/56  Pulse: (!) 124 (!) 115 (!) 101 95  Resp: (!) 28 (!) 29 19 (!) 22  Temp:  98.5 F (36.9 C) 98.6 F (37 C) 98.2 F (36.8 C)  TempSrc:  Oral Oral Oral  SpO2: 95%   94%  Weight:  75.7 kg  74.8 kg  Height:       Physical Exam  Constitutional: She is oriented to person, place, and time and well-developed, well-nourished, and in no distress.  HENT:  Right Ear: Hearing and ear canal normal. No drainage or tenderness. Tympanic membrane is not injected, not scarred, not perforated, not erythematous and not bulging. No decreased hearing is noted.  Left Ear: Hearing, external ear and ear canal normal. No drainage or tenderness. Tympanic membrane is not injected, not scarred, not perforated, not erythematous and not bulging. No decreased hearing is noted.  Cardiovascular: Normal rate, regular rhythm and normal heart sounds.  No murmur heard. Pulmonary/Chest: Effort normal. No respiratory distress. She has no wheezes. She has no rales.  Left sided crackles   Musculoskeletal: She exhibits edema.  Neurological: She is alert and oriented to person, place, and time.  Skin: Skin is warm and dry.    Assessment/Plan:  Active Problems:  Lobar pneumonia (HCC)   Parapneumonic effusion  Ms. Terri Morales is a 67 y.o female with a PMHx of HTN, HLD, DM, ischemic colitis on coumadin, and abdominal hernias presenting with a five day history of productive cough with fevers. CXR and CTA illustrated left lower lobe pneumonia and left sided pleural effusion.   Left lobar pneumonia Simple parapneumonic effusion WBC trending down, from 25.8 to 16.9. Transaminitis improving. New onset of left ear pain with no purulent draining. On exam, non-bulging non-erythematous TM. Will continue to cover for anaerobes and atypicals. Zoysn due to recent hospitalization and risk for MDR. Will need repeat CXR in 4-6 weeks. -- azithromycin and zoysn day 2 -- consider sampling of pleural effusion if patient does not clinically improve -- monitor vitals, WBC  -- pain control: ibuprofen 800 mg q8h  -- f/u cmp -- f/u blood culture  Ischemic Colitis INR increased to 3.20. PT 32.5. -- warfarin per pharmacy -- monitor for s/sx of bleeding  -- consider holding if any surgical intervention is necessary  -- f/u CBC  DM CBG 114. A1c 7.8 -- continue insulin detemir 10 units with SSI-moderate  -- monitor CBGs  HLD -- continue home medication pravastatin 40 mg qd  Diet: Carb Modified VTE prophylaxis: Pt on Warfarin Code: Full  Dispo: Anticipated discharge in approximately 2-3 day(s) in order to stabilize and treat infection while managing pain.  Jaci StandardRehman, Areeg N, DO 12/25/2017, 6:42 AM Pager: [phone]

## 2017-12-25 NOTE — Plan of Care (Signed)
  Problem: Clinical Measurements: Goal: Diagnostic test results will improve Outcome: Progressing Goal: Respiratory complications will improve Outcome: Progressing   

## 2017-12-26 LAB — GLUCOSE, CAPILLARY
GLUCOSE-CAPILLARY: 124 mg/dL — AB (ref 70–99)
Glucose-Capillary: 109 mg/dL — ABNORMAL HIGH (ref 70–99)
Glucose-Capillary: 248 mg/dL — ABNORMAL HIGH (ref 70–99)
Glucose-Capillary: 173 mg/dL — ABNORMAL HIGH (ref 70–99)

## 2017-12-26 LAB — COMPREHENSIVE METABOLIC PANEL
ALK PHOS: 289 U/L — AB (ref 38–126)
ALT: 58 U/L — ABNORMAL HIGH (ref 0–44)
AST: 81 U/L — ABNORMAL HIGH (ref 15–41)
Albumin: 2 g/dL — ABNORMAL LOW (ref 3.5–5.0)
Anion gap: 8 (ref 5–15)
BILIRUBIN TOTAL: 1.1 mg/dL (ref 0.3–1.2)
BUN: 12 mg/dL (ref 8–23)
CALCIUM: 8.3 mg/dL — AB (ref 8.9–10.3)
CO2: 21 mmol/L — ABNORMAL LOW (ref 22–32)
CREATININE: 0.76 mg/dL (ref 0.44–1.00)
Chloride: 109 mmol/L (ref 98–111)
GFR calc non Af Amer: >60 mL/min (ref >60)
Glucose, Bld: 123 mg/dL — ABNORMAL HIGH (ref 70–99)
Potassium: 3.3 mmol/L — ABNORMAL LOW (ref 3.5–5.1)
Sodium: 138 mmol/L (ref 135–145)
TOTAL PROTEIN: 5.6 g/dL — AB (ref 6.5–8.1)

## 2017-12-26 LAB — CBC
HEMATOCRIT: 35.6 % — AB (ref 36.0–46.0)
HEMOGLOBIN: 11.4 g/dL — AB (ref 12.0–15.0)
MCH: 24.6 pg — AB (ref 26.0–34.0)
MCHC: 32 g/dL (ref 30.0–36.0)
MCV: 76.9 fL — AB (ref 78.0–100.0)
Platelets: 231 10*3/uL (ref 150–400)
RBC: 4.63 MIL/uL (ref 3.87–5.11)
RDW: 17.9 % — ABNORMAL HIGH (ref 11.5–15.5)
WBC: 10.5 10*3/uL (ref 4.0–10.5)

## 2017-12-26 LAB — PROTIME-INR
INR: 4.08
Prothrombin Time: 39.3 seconds — ABNORMAL HIGH (ref 11.4–15.2)

## 2017-12-26 MED ORDER — PNEUMOCOCCAL VAC POLYVALENT 25 MCG/0.5ML IJ INJ
0.5000 mL | INJECTION | INTRAMUSCULAR | Status: AC
  Administered 2017-12-27: 0.5 mL via INTRAMUSCULAR
  Filled 2017-12-26: qty 0.5

## 2017-12-26 MED ORDER — SALINE SPRAY 0.65 % NA SOLN
1.0000 | NASAL | Status: DC | PRN
  Administered 2017-12-26: 1 via NASAL
  Filled 2017-12-26 (×2): qty 44

## 2017-12-26 MED ORDER — GUAIFENESIN ER 600 MG PO TB12
600.0000 mg | ORAL_TABLET | Freq: Two times a day (BID) | ORAL | Status: DC | PRN
  Administered 2017-12-26: 600 mg via ORAL
  Filled 2017-12-26: qty 1

## 2017-12-26 MED ORDER — MINERAL OIL PO OIL
TOPICAL_OIL | Freq: Every day | ORAL | Status: DC
  Administered 2017-12-26: 30 mL via ORAL
  Filled 2017-12-26 (×2): qty 30

## 2017-12-26 MED ORDER — POTASSIUM CHLORIDE CRYS ER 20 MEQ PO TBCR
40.0000 meq | EXTENDED_RELEASE_TABLET | Freq: Once | ORAL | Status: AC
  Administered 2017-12-26: 40 meq via ORAL
  Filled 2017-12-26: qty 2

## 2017-12-26 NOTE — Progress Notes (Signed)
ANTICOAGULATION CONSULT NOTE - Follow-Up Consult  Pharmacy Consult for Warfarin Indication: Hx VTE + Antiphospholipid Syndrome  Allergies  Allergen Reactions  . Clarithromycin Other (See Comments)    unsure  . Morphine And Related Hives  . Latex Rash    Patient Measurements: Height: 4\' 10"  (147.3 cm) Weight: 166 lb 8 oz (75.5 kg) IBW/kg (Calculated) : 40.9  Vital Signs: Temp: 97.8 F (36.6 C) (09/01 0439) Temp Source: Oral (09/01 0439) BP: 142/69 (09/01 0439) Pulse Rate: 82 (09/01 0439)  Labs: Recent Labs    12/24/17 0900 12/24/17 0925 12/25/17 0450 12/26/17 0555  HGB 13.1  --  12.1 11.4*  HCT 42.2  --  38.4 35.6*  PLT 301  --  256 231  LABPROT  --  23.5* 32.5* 39.3*  INR  --  2.11 3.20 4.08*  CREATININE 1.01*  --  0.83 0.76    Estimated Creatinine Clearance: 58.9 mL/min (by C-G formula based on SCr of 0.76 mg/dL).   Medical History: Past Medical History:  Diagnosis Date  . Abdominal hernia   . Diabetes mellitus without complication (HCC)   . Embolism (HCC)   . Hyperlipemia   . Ischemic colitis (HCC)   . Vertigo      Assessment: 35 yoF with hx antiphospholipid syndrome and VTE on warfarin PTA admitted with PNA. INR therapeutic on admit now supratherapeutic despite giving lower doses. Hgb down slightly but no bleeding noted by RN. LFTs rising as well so possible liver-induced coagulopathy impacting true INR.  Home Warfarin Dose = 5mg  daily  Goal of Therapy:  INR 2-3 Monitor platelets by anticoagulation protocol: Yes   Plan:  -Hold warfarin tonight -Daily protime  Fredonia Highland, PharmD, BCPS Clinical Pharmacist [phone] Please check AMION for all [facility] Pharmacy numbers 12/26/2017

## 2017-12-26 NOTE — Progress Notes (Signed)
   Subjective: Terri Morales reported feeling better this morning.  She said she had some difficulty sleeping due to her cough. Both her pleuritic chest pain and her left lower back pain remain significantly improved today. She denies further ear pain, but does endorse recurrence of her chronic ringing/buzzing in her ear. Denies fevers or shortness of breath. She request mineral oil to help with her bowl movements, as this is what her GI doctor has prescribed for her. All questions and concerns were addressed.   Objective:  Vital signs in last 24 hours: Vitals:   12/25/17 1242 12/25/17 1643 12/25/17 1929 12/26/17 0439  BP: 132/62 129/71 (!) 108/43 (!) 142/69  Pulse: (!) 105 93 96 82  Resp:   17 14  Temp: 97.9 F (36.6 C) 97.9 F (36.6 C) 98.2 F (36.8 C) 97.8 F (36.6 C)  TempSrc: Oral Oral Oral Oral  SpO2: 92% 96% (!) 89% 100%  Weight:    75.5 kg  Height:       Physical Exam  Constitutional: She is oriented to person, place, and time and well-developed, well-nourished, and in no distress.  HENT:  Right Ear: Hearing and ear canal normal. No drainage or tenderness. Tympanic membrane is not injected, not scarred, not perforated, not erythematous and not bulging. No decreased hearing is noted.  Left Ear: Hearing, external ear and ear canal normal. No drainage or tenderness. Tympanic membrane is not injected, not scarred, not perforated, not erythematous and not bulging. No decreased hearing is noted.  Cardiovascular: Normal rate, regular rhythm and normal heart sounds.  No murmur heard. Pulmonary/Chest: Effort normal. No respiratory distress. She has no wheezes. She has no rales.  Left sided crackles   Musculoskeletal: She exhibits edema.  Neurological: She is alert and oriented to person, place, and time.  Skin: Skin is warm and dry.    Assessment/Plan:  Active Problems:   Lobar pneumonia (HCC)   Parapneumonic effusion   Antiphospholipid syndrome [facility])  Terri Morales is a 67 y.o  female with a PMHx of HTN, HLD, DM, ischemic colitis on coumadin, and abdominal hernias presenting with a five day history of productive cough with fevers. CXR and CTA illustrated left lower lobe pneumonia and left sided pleural effusion.   Left lobar pneumonia Simple parapneumonic effusion WBC trending down 25.8 >> 16.9 >> 10. AST and ALT down trending. ALP elevated at 289 today. No recurrence of ear pain. Continuing to cover for anaerobes and atypicals. Zoysn due to recent hospitalization and risk for MDR. Will need repeat CXR in 4-6 weeks. Will check for legionella as this has also been associated with elevated LFTs. - azithromycin and zoysn day 3 - consider sampling of pleural effusion if patient does not clinically improve - monitor vitals, WBC - pain control: ibuprofen 800 mg q8h  - AM CMP - Blood culture (NGTD) - Legionella Ur Ag  Ischemic Colitis INR increased to 3.20 - Warfarin per pharmacy - Monitor for s/sx of bleeding  - Consider holding if any surgical intervention is necessary  - Started on Mineral Oil Daily - f/u CBC  DM: A1c 7.8 - continue insulin detemir 10 units with SSI-moderate - monitor CBGs  HLD - continue home medication pravastatin 40 mg qd  Diet: Carb Modified VTE prophylaxis: Pt on Warfarin Code: Full  Dispo: Anticipated discharge in approximately 1-2 day(s) in order to stabilize and treat infection while managing pain.  Beola Cord, MD 12/26/2017, 6:27 AM Pager: [phone]

## 2017-12-26 NOTE — Progress Notes (Signed)
CRITICAL VALUE ALERT  Critical Value:  INR 4.08  Date & Time Notied:  9/1 07:41  Provider Notified: paged MD awaiting call back  Orders Received/Actions taken:

## 2017-12-26 NOTE — Plan of Care (Signed)
°  Problem: Clinical Measurements: °Goal: Will remain free from infection °Outcome: Progressing °Goal: Respiratory complications will improve °Outcome: Progressing °  °

## 2017-12-27 LAB — COMPREHENSIVE METABOLIC PANEL
ALK PHOS: 302 U/L — AB (ref 38–126)
ALT: 57 U/L — ABNORMAL HIGH (ref 0–44)
AST: 80 U/L — ABNORMAL HIGH (ref 15–41)
Albumin: 2 g/dL — ABNORMAL LOW (ref 3.5–5.0)
Anion gap: 7 (ref 5–15)
BUN: 9 mg/dL (ref 8–23)
CALCIUM: 8.3 mg/dL — AB (ref 8.9–10.3)
CO2: 23 mmol/L (ref 22–32)
CREATININE: 0.73 mg/dL (ref 0.44–1.00)
Chloride: 109 mmol/L (ref 98–111)
GFR calc non Af Amer: >60 mL/min (ref >60)
GLUCOSE: 76 mg/dL (ref 70–99)
Potassium: 3.7 mmol/L (ref 3.5–5.1)
SODIUM: 139 mmol/L (ref 135–145)
Total Bilirubin: 0.7 mg/dL (ref 0.3–1.2)
Total Protein: 5.6 g/dL — ABNORMAL LOW (ref 6.5–8.1)

## 2017-12-27 LAB — CBC
HEMATOCRIT: 34.9 % — AB (ref 36.0–46.0)
HEMOGLOBIN: 11.3 g/dL — AB (ref 12.0–15.0)
MCH: 24.8 pg — AB (ref 26.0–34.0)
MCHC: 32.4 g/dL (ref 30.0–36.0)
MCV: 76.5 fL — ABNORMAL LOW (ref 78.0–100.0)
Platelets: 250 10*3/uL (ref 150–400)
RBC: 4.56 MIL/uL (ref 3.87–5.11)
RDW: 18.1 % — ABNORMAL HIGH (ref 11.5–15.5)
WBC: 11 10*3/uL — ABNORMAL HIGH (ref 4.0–10.5)

## 2017-12-27 LAB — LEGIONELLA PNEUMOPHILA SEROGP 1 UR AG: L. pneumophila Serogp 1 Ur Ag: NEGATIVE

## 2017-12-27 LAB — GLUCOSE, CAPILLARY
GLUCOSE-CAPILLARY: 170 mg/dL — AB (ref 70–99)
Glucose-Capillary: 88 mg/dL (ref 70–99)

## 2017-12-27 LAB — PROTIME-INR
INR: 4
PROTHROMBIN TIME: 38.7 s — AB (ref 11.4–15.2)

## 2017-12-27 MED ORDER — IBUPROFEN 800 MG PO TABS: 800.0000 mg | ORAL_TABLET | Freq: Three times a day (TID) | ORAL | Status: DC | PRN

## 2017-12-27 MED ORDER — IBUPROFEN 800 MG PO TABS: 800.0000 mg | ORAL_TABLET | Freq: Three times a day (TID) | ORAL | 0 refills | Status: DC | PRN

## 2017-12-27 MED ORDER — AMOXICILLIN 500 MG PO TABS: 1000.0000 mg | ORAL_TABLET | Freq: Three times a day (TID) | ORAL | 0 refills | Status: DC

## 2017-12-27 MED ORDER — PANTOPRAZOLE SODIUM 40 MG PO TBEC: 40.0000 mg | DELAYED_RELEASE_TABLET | Freq: Every day | ORAL | 0 refills | Status: DC

## 2017-12-27 MED ORDER — AZITHROMYCIN 500 MG PO TABS: 500.0000 mg | ORAL_TABLET | Freq: Every day | ORAL | 0 refills | Status: AC

## 2017-12-27 NOTE — Progress Notes (Signed)
   Subjective:  Overnight: No acute events reported  Today, Terri Morales was examined at bedside while eating breakfast.  She reports she is doing significantly better with improvement to pleuritic chest pain.  She reports of really good p.o. intake and is passing flatus and having bowel movement.  She denies fevers, chills, shortness of breath, nausea, vomiting.  Objective:  Vital signs in last 24 hours: Vitals:   12/26/17 0439 12/26/17 1428 12/26/17 2040 12/27/17 0428  BP: (!) 142/69 (!) 111/40 (!) 150/57 (!) 137/59  Pulse: 82 94 96 85  Resp: 14   16  Temp: 97.8 F (36.6 C) 98 F (36.7 C) 99.4 F (37.4 C) 97.8 F (36.6 C)  TempSrc: Oral Oral Oral Oral  SpO2: 100% 95% 95% 98%  Weight: 75.5 kg   75.8 kg  Height:       Physical exam: Constitutional: In no acute distress, pleasantly lying in bed and conversational Cardiovascular: Normal S1 and S2.  No murmurs, gallops, rubs Respiratory: Decreased breath sound at the left lower lobe, dull to percussion otherwise normal lung exams with no wheezes, crackles, rhonchi Abdominal: Bowel sounds present, nondistended, nontender to palpation.  Assessment/Plan:  Active Problems:   Lobar pneumonia (HCC)   Parapneumonic effusion   Antiphospholipid syndrome [facility])  Terri Morales is a 67 year old woman with medical history significant for hypertension, hyperlipidemia, diabetes mellitus, antiphospholipid syndrome with ischemic colitis on chronic warfarin currently being managed for left lobar pneumonia and simple parapneumonic effusion.  Left lobar pneumonia, simple parapneumonic effusion: She has been improving clinically and also on objective findings.  Has remained afebrile, WBC trending 11<<10.5<<16.9.  Her transaminitis thought to be secondary to atypical infection has been stable but also checking for Legionella urine antigen.  She has a multidrug-resistant strep pneumonia given her age, medical comorbidities and recent hospitalization and was  thus started on Zosyn.  Repeat chest x-ray in 4 to 6 weeks after discharge. For patients with uncomplicated pneumonic effusions, it is recommended to treat for 7 to 14 days. The guidelines recommend amoxicillin 1 g TID plus azithromycin 500 mg daily which she will be discharged on for a total of 7 days. I will give a printed prescription to patient.  -Abx:  --->s/p Ceftriaxone 1 gm x 1 dose  --->s/p Azithromycin 500mg  x 1 dose --->Currently on Azithromycin 250mg  qd (Day 3), Zosyn 3.375g q8 (Day 4).  -For her pleuritic pain, she will be discharged with 1 week supply of ibuprofen 800 mg AS NEEDED for pain.  Antiphospholipid syndrome w/ hx of ischemic colitis: Patient is on chronic daily warfarin 5 mg.  She is to maintain an INR of 2-3, however she has been noted to have increased INR 4<<4.08<<3.20 partly due to azithromycin and NSAIDs.  She currently has no signs of active bleeding and her warfarin dose has been held per pharmacy.  I have had a discussion with patient pertaining to warfarin dosing after my discussion with pharmacy.  She is to hold warfarin 9/2 & 9/3 and take half a pill (Warfarin 2.5mg ) on 9/4 and half INR checked on Thursday when she gets back to [place]. -Summary:  --->HOLD Warfarin 9/2-9/3.  --->Take Warfarin 2.5mg  on 9/4 and get INR on Thursday in [place].  Diabetes mellitus: Continue insulin detemir 10 units with sliding scale insulin.  Hyperlipidemia: Continue pravastatin 40 mg daily.  Diet: Carb Modified VTE prophylaxis: Pt on Warfarin Code: Full  Dispo: Anticipated discharge today.  Terri Rack, MD 12/27/2017, 12:05 PM Pager: [phone] IMTS PGY-1

## 2017-12-27 NOTE — Progress Notes (Addendum)
ANTICOAGULATION CONSULT NOTE - Follow-Up Consult  Pharmacy Consult for Warfarin Indication: Hx VTE + Antiphospholipid Syndrome  Allergies  Allergen Reactions  . Clarithromycin Other (See Comments)    unsure  . Morphine And Related Hives  . Latex Rash    Patient Measurements: Height: 4\' Terri"  (147.3 cm) Weight: 167 lb (75.8 kg) IBW/kg (Calculated) : 40.9  Vital Signs: Temp: 97.8 F (36.6 C) (09/02 0428) Temp Source: Oral (09/02 0428) BP: 137/59 (09/02 0428) Pulse Rate: 85 (09/02 0428)  Labs: Recent Labs    12/25/17 0450 12/26/17 0555 12/27/17 0418  HGB 12.1 11.4* 11.3*  HCT 38.4 35.6* 34.9*  PLT 256 231 250  LABPROT 32.5* 39.3* 38.7*  INR 3.20 4.08* 4.00  CREATININE 0.83 0.76 0.73    Estimated Creatinine Clearance: 59.1 mL/min (by C-G formula based on SCr of 0.73 mg/dL).   Medical History: Past Medical History:  Diagnosis Date  . Abdominal hernia   . Diabetes mellitus without complication (HCC)   . Embolism (HCC)   . Hyperlipemia   . Ischemic colitis (HCC)   . Vertigo      Assessment: Terri Morales with hx antiphospholipid syndrome and VTE on warfarin PTA admitted with PNA. INR therapeutic on admit now supratherapeutic despite giving lower doses. Hgb stable, no bleeding noted by RN. LFTs elevated, but stable, so possible liver-induced coagulopathy impacting true INR.  Home Warfarin Dose = 5mg  daily  Goal of Therapy:  INR 2-3 Monitor platelets by anticoagulation protocol: Yes   Plan:  -Hold warfarin again tonight -Daily PT/INR.Marland KitchenPaging MD to discuss need for scheduled ibuprofen in patient on chronic warfarin  Reece Leader, Colon Flattery, [facility] Clinical Pharmacist Phone [phone]  12/27/2017 8:51 AM

## 2017-12-27 NOTE — Progress Notes (Signed)
Internal Medicine Attending  Date: 12/27/2017  Patient name: Libi Otanez Medical record number: [id number] Date of birth: [birth year] Age: 67 y.o. Gender: female  I saw and evaluated the patient. I reviewed the resident's note by Dr. Antonieta Pert and I agree with the resident's findings and plans as documented in his progress note.  When seen on rounds morning Ms. Willenbrink was feeling much improved with regards to both her breathing and pleuritic pain.  She continues to have a cough, but this is as expected. She feels well enough for discharge home. We will therefore convert her to oral antibiotics and treat for a total of 7 days. She has been given a dosing schedule for her warfarin therapy and needs to have an INR checked in [place] upon her return Thursday. She was asked to take the PPI therapy for 1 more week and to use the ibuprofen as needed only. I anticipate that one week we'll be more than enough supply to get her through as her pleuritic pain resolves with completion of antibiotic therapy for her left lower lobe pneumonia. Follow-up will be in [place] with a chest x-ray in 4-6 weeks and she was informed of this recommendation.

## 2017-12-27 NOTE — Discharge Summary (Signed)
Name: Terri Morales MRN: [id number] DOB: 1950/06/11 67 y.o. PCP: Patient, No Pcp Per  Date of Admission: 12/24/2017  8:44 AM Date of Discharge: 12/27/2017 Attending Physician: Dr. Josem Kaufmann   Discharge Diagnosis: 1.  Left lobar pneumonia with simple parapneumonic effusion 2.  Antiphospholipid syndrome with history of ischemic colitis 3.  Diabetes mellitus 4.  Hyperlipidemia   Discharge Medications: Allergies as of 12/27/2017      Reactions   Clarithromycin Other (See Comments)   unsure   Morphine And Related Hives   Latex Rash      Medication List    STOP taking these medications   acetaminophen 500 MG tablet Commonly known as:  TYLENOL     TAKE these medications   amoxicillin 500 MG tablet Commonly known as:  AMOXIL Take 2 tablets (1,000 mg total) by mouth 3 (three) times daily.   azithromycin 500 MG tablet Commonly known as:  ZITHROMAX Take 1 tablet (500 mg total) by mouth daily for 4 doses.   DHEA PO Take 1 tablet by mouth daily.   gabapentin 100 MG capsule Commonly known as:  NEURONTIN Take 200 mg by mouth at bedtime.   ibuprofen 800 MG tablet Commonly known as:  ADVIL,MOTRIN Take 1 tablet (800 mg total) by mouth every 8 (eight) hours as needed for moderate pain.   JANUVIA 100 MG tablet Generic drug:  sitaGLIPtin Take 100 mg by mouth daily.   JARDIANCE 10 MG Tabs tablet Generic drug:  empagliflozin Take 10 mg by mouth daily.   meclizine 12.5 MG tablet Commonly known as:  ANTIVERT Take 12.5 mg by mouth 3 (three) times daily as needed for dizziness.   Melatonin 10 MG Tabs Take 10 mg by mouth at bedtime as needed for sleep.   MULTI-VITAMINS Tabs Take 1 tablet by mouth daily.   ondansetron 4 MG disintegrating tablet Commonly known as:  ZOFRAN-ODT Take 4 mg by mouth every 6 (six) hours as needed for nausea/vomiting.   pantoprazole 40 MG tablet Commonly known as:  PROTONIX Take 1 tablet (40 mg total) by mouth daily.   pravastatin 40 MG  tablet Commonly known as:  PRAVACHOL Take 40 mg by mouth at bedtime.   senna 8.6 MG tablet Commonly known as:  SENOKOT Take 1 tablet by mouth at bedtime as needed for constipation.   venlafaxine XR 150 MG 24 hr capsule Commonly known as:  EFFEXOR-XR Take 150 mg by mouth daily.   Vitamin D3 5000 units Tabs Take 1,000 Units by mouth daily.   warfarin 2.5 MG tablet Commonly known as:  COUMADIN Take 5 mg by mouth every evening. Current dose       Disposition and follow-up:   Terri Morales was discharged from [facility] in [place] condition.  At the hospital follow up visit please address:  1.  Left lobar pneumonia with simple parapneumonic effusion: Repeat CXR in 4-6 weeks, Take Amoxicillin 1g TID x 4days, Azithromycin 500mg  qd x4 days       Antiphospholipid syndrome with history of ischemic colitis: Hold warfarin 9/2-9/3, Resume 2.5mg  on 9/4 and follow up INR clinic in [place]    2.  Labs / imaging needed at time of follow-up: None  3.  Pending labs/ test needing follow-up: INR  Follow-up Appointments: PCP in [facility] Course by problem list: 1.  Community-acquired left lower lobe pneumonia complicated by pleurisy and transaminitis.  Terri Morales is a 67 year old woman with medical history significant for hypertension, hyperlipidemia, diabetes mellitus, antiphospholipid  syndrome with complication of ischemic colitis who presented today emergency department on 12/24/2017 with a 5-day history of fevers, cough, chills and pleuritic chest pain.  Chest x-ray and CT angiogram revealed left lower lobe pneumonia with left-sided pleural effusion.  Interventional radiology was consulted for possible thoracocentesis but however they recommended no intervention and thought her effusion was a simple parapneumonic effusion most likely secondary to community-acquired pneumonia.  She was found to have transaminitis which has been associated with mycoplasma  pneumonia.  She had blood cultures drawn which showed no growth. Initial CBC showed leukocytosis of 16.9 which improved to 11 at discharge. Here is a summary of her antibiotic regimen during this hospital course: Ceftriaxone 1 g x 1 dose, and azithromycin 500 mg x 1 dose, azithromycin 250 daily mg x 3 days, Zosyn 3.375 g q8 x 4 days (to cover gram negative enterics given history of recent bowel surgery). It is good to note that she was at risk for multidrug resistant strep pneumonia given her age 67, medical comorbidities and recent hospitalization.   At discharge, she received amoxicillin 1 g TID and azithromycin 500 mg daily for 4 days to complete a total of 7 days as recommended by the guidelines for treatment of uncomplicated pneumonia with simple parapneumonic effusion.  For her pleuritic pain, she was treated with ibuprofen 800 mg TID and reported significant improvement.  She was concomitantly started on a PPI given that she is on chronic warfarin for treatment of antiphospholipid syndrome.  2.  Antiphospholipid syndrome with history of ischemic colitis  Terri Morales has antiphospholipid syndrome which was complicated with ischemic colitis and has been on chronic daily warfarin 5 mg.  On initial presentation on 12/24/2017 her INR was recorded at 2.11 and only received one dose of Warfarin. She had gradually increasing INR which subsequently led to holding her warfarin. She did not have any signs of GI , mucosal , intra-articular or cerebral bleed. At discharge, she was asked to hold Warfarin for a couple more days and have INR checked once she gets to [place]   3.  Diabetes mellitus: She was maintained on insulin detemir 10U with moderate sliding scale   4.  Hyperlipidemia: Continued pravastatin 40mg  daily    Discharge Vitals:   BP (!) 156/69 (BP Location: Right Arm)   Pulse 82   Temp 97.7 F (36.5 C) (Oral)   Resp (!) 21   Ht 4\' 10"  (1.473 m)   Wt 75.8 kg   SpO2 98%   BMI 34.90 kg/m    Pertinent Labs, Studies, and Procedures:  CBC Latest Ref Rng & Units 12/27/2017 12/26/2017 12/25/2017  WBC 4.0 - 10.5 K/uL 11.0(H) 10.5 16.9(H)  Hemoglobin 12.0 - 15.0 g/dL 11.3(L) 11.4(L) 12.1  Hematocrit 36.0 - 46.0 % 34.9(L) 35.6(L) 38.4  Platelets 150 - 400 K/uL 250 231 256   Results for AUBRIEGH, CELEDON (MRN [id number]) as of 12/28/2017 15:46  Ref. Range 12/24/2017 09:25 12/25/2017 04:50 12/26/2017 05:55 12/26/2017 17:09 12/27/2017 04:18  INR Unknown 2.11 3.20 4.08 (HH)  4.00    Discharge Instructions: Discharge Instructions    Call MD for:   Complete by:  As directed    Call MD for:  difficulty breathing, headache or visual disturbances   Complete by:  As directed    Call MD for:  extreme fatigue   Complete by:  As directed    Call MD for:  hives   Complete by:  As directed    Call MD for:  persistant dizziness  or light-headedness   Complete by:  As directed    Call MD for:  persistant nausea and vomiting   Complete by:  As directed    Call MD for:  redness, tenderness, or signs of infection (pain, swelling, redness, odor or green/yellow discharge around incision site)   Complete by:  As directed    Call MD for:  severe uncontrolled pain   Complete by:  As directed    Call MD for:  temperature >100.4   Complete by:  As directed    Diet - low sodium heart healthy   Complete by:  As directed    Discharge instructions   Complete by:  As directed    Terri Morales, it was a pleasure taking care of you here in the hospital.  You were admitted because of pneumonia and small fluid around your lung.  You were treated with antibiotics and will be discharged to continue antibiotics for an additional 4 days.  As we discussed, you will not take warfarin today and tomorrow.  You can take half a pill of warfarin which is 2.5 mg on Wednesday on your way back to [place] and have your INR checked on Thursday.  Please follow-up with your primary care provider and have a safe journey.   Increase  activity slowly   Complete by:  As directed       Signed: Yvette Rack, MD 12/28/2017, 3:37 PM   Pager: [phone] IMTS PGY-1

## 2017-12-29 LAB — CULTURE, BLOOD (ROUTINE X 2)
CULTURE: NO GROWTH
Culture: NO GROWTH
Special Requests: ADEQUATE

## 2019-06-07 ENCOUNTER — Ambulatory Visit: Admit: 2019-06-07 | Payer: PRIVATE HEALTH INSURANCE | Attending: Pharmacotherapy | Primary: Internal Medicine

## 2019-06-07 ENCOUNTER — Inpatient Hospital Stay: Admit: 2019-06-07 | Discharge: 2019-06-07 | Payer: PRIVATE HEALTH INSURANCE | Primary: Internal Medicine

## 2019-06-07 ENCOUNTER — Encounter: Admit: 2019-06-07 | Payer: PRIVATE HEALTH INSURANCE | Attending: Pharmacotherapy | Primary: Internal Medicine

## 2019-06-07 DIAGNOSIS — D6862 Lupus anticoagulant syndrome: Secondary | ICD-10-CM

## 2019-06-07 DIAGNOSIS — L719 Rosacea, unspecified: Secondary | ICD-10-CM

## 2019-06-07 DIAGNOSIS — Z98811 Dental restoration status: Secondary | ICD-10-CM

## 2019-06-07 DIAGNOSIS — K559 Vascular disorder of intestine, unspecified: Secondary | ICD-10-CM

## 2019-06-07 DIAGNOSIS — K219 Gastro-esophageal reflux disease without esophagitis: Secondary | ICD-10-CM

## 2019-06-07 DIAGNOSIS — Z5181 Encounter for therapeutic drug level monitoring: Secondary | ICD-10-CM

## 2019-06-07 DIAGNOSIS — F32A Depression: Secondary | ICD-10-CM

## 2019-06-07 DIAGNOSIS — R42 Dizziness and giddiness: Secondary | ICD-10-CM

## 2019-06-07 DIAGNOSIS — Z7901 Long term (current) use of anticoagulants: Secondary | ICD-10-CM

## 2019-06-07 DIAGNOSIS — E119 Type 2 diabetes mellitus without complications: Secondary | ICD-10-CM

## 2019-06-07 DIAGNOSIS — R112 Nausea with vomiting, unspecified: Secondary | ICD-10-CM

## 2019-06-07 DIAGNOSIS — E7800 Pure hypercholesterolemia, unspecified: Secondary | ICD-10-CM

## 2019-06-07 DIAGNOSIS — K436 Other and unspecified ventral hernia with obstruction, without gangrene: Secondary | ICD-10-CM

## 2019-06-07 DIAGNOSIS — D219 Benign neoplasm of connective and other soft tissue, unspecified: Secondary | ICD-10-CM

## 2019-06-07 DIAGNOSIS — K55039 Acute (reversible) ischemia of large intestine, extent unspecified: Secondary | ICD-10-CM

## 2019-06-07 LAB — PROTIME AND INR
BKR INR: 2.34 — ABNORMAL HIGH (ref 0.90–1.10)
BKR PROTHROMBIN TIME: 24.3 s — ABNORMAL HIGH (ref 10.3–11.7)

## 2019-06-07 NOTE — Progress Notes
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  The patient was contacted regarding lab drawn INR results.  Patient's INR findings and prescribed dose from last visit:Anticoagulation Monitoring Latest Ref Rng & Units 05/08/2019 05/24/2019 06/07/2019 INR 2 - 3 - - - INR 0.91 - 1.10 - - - INR (External) 0.8 - 1.15 - - - Assoc. INR Date - 05/08/2019 05/23/2019 06/07/2019 Associated INR - 3.86 2.34 2.34 Instructions - 1/11: Hold; Otherwise 2.5 mg every Thu; 5 mg all other days 2.5 mg every Thu; 5 mg all other days 2.5 mg every Thu; 5 mg all other days Last 7 day dose total - 35 mg 32.5 mg 32.5 mg Next INR Date - 05/19/2019 06/06/2019 06/21/2019 Findings from today's call:Patient Findings   Negatives:  Signs/symptoms of thrombosis, Signs/symptoms of bleeding, Laboratory test error suspected, Change in health, Change in alcohol use, Change in activity, Upcoming invasive procedure, Emergency department visit, Upcoming dental procedure, Missed doses, Extra doses, Change in medications, Change in diet/appetite, Hospital admission, Bruising, Other complaints  Comments:  Unable to assess.  Left voice message to have patient call the clinic with any changes.  Assessment/Plan:Patient's INR is Therapeutic.  INR=2.34. Patient's chart was reviewed and voice message was left instructing patient to call the clinic if there have been any changes to prescription or over the counter medications or diet, if experiencing a current illness, if there are any planned upcoming invasive/dental procedures or any other changes that could affect warfarin therapy.Based on the [facility] Ambulatory Warfarin Protocol, she was instructed to continue current weekly dose as indicated in the maintenance plan below.  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  Electronically Signed by Maria [name], RPH, February 10, 2021[facility]  [address], [place], [place] [address]

## 2019-06-22 ENCOUNTER — Telehealth: Admit: 2019-06-22 | Payer: PRIVATE HEALTH INSURANCE | Primary: Internal Medicine

## 2019-06-22 NOTE — Telephone Encounter
PHARMACY NOTE: INR REMINDER MISSED CALL 	I have left a voicemail for Ms. [name] on 06/22/2019 at 10:14 AM with a reminder that patient is due for an INR lab test based on the referral for anticoagulation monitoring. This is the first week of missed calls. I have also notified the ambulatory care pharmacist   as appropriate.[name], CPHTMedication History Technician ll- Ambulatory Care TechnicianPharmacy Department2/25/202110:14 AM

## 2019-06-29 ENCOUNTER — Telehealth: Admit: 2019-06-29 | Payer: PRIVATE HEALTH INSURANCE | Primary: Internal Medicine

## 2019-06-29 NOTE — Telephone Encounter
PHARMACY NOTE: INR REMINDER MISSED CALL 	I have left a voicemail for [name] on 06/29/2019 at 11:22 AM with a reminder that patient is due for an INR lab test based on the referral for anticoagulation monitoring. This is the second week of missed calls. I have also notified the ambulatory care pharmacist   as appropriate.Malvin Johns, CPHTMedication History Technician ll- Ambulatory Care TechnicianPharmacy Department3/4/202111:22 AM

## 2019-06-30 ENCOUNTER — Ambulatory Visit
Admit: 2019-06-30 | Payer: PRIVATE HEALTH INSURANCE | Attending: Pharmacist Clinician (PhC)/ Clinical Pharmacy Specialist | Primary: Internal Medicine

## 2019-06-30 ENCOUNTER — Inpatient Hospital Stay: Admit: 2019-06-30 | Discharge: 2019-06-30 | Payer: PRIVATE HEALTH INSURANCE | Primary: Internal Medicine

## 2019-06-30 DIAGNOSIS — D6862 Lupus anticoagulant syndrome: Secondary | ICD-10-CM

## 2019-06-30 DIAGNOSIS — K559 Vascular disorder of intestine, unspecified: Secondary | ICD-10-CM

## 2019-06-30 DIAGNOSIS — Z5181 Encounter for therapeutic drug level monitoring: Secondary | ICD-10-CM

## 2019-06-30 DIAGNOSIS — Z7901 Long term (current) use of anticoagulants: Secondary | ICD-10-CM

## 2019-06-30 LAB — PROTIME AND INR
BKR INR: 2.81 — ABNORMAL HIGH (ref 0.90–1.10)
BKR PROTHROMBIN TIME: 28.9 s — ABNORMAL HIGH (ref 10.3–11.7)

## 2019-06-30 NOTE — Progress Notes
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  The patient was contacted regarding lab drawn INR results.  Patient's INR findings and prescribed dose from last visit:Anticoagulation Monitoring Latest Ref Rng & Units 05/24/2019 06/07/2019 06/30/2019 INR 2 - 3 - - - INR 0.91 - 1.10 - - - INR (External) 0.8 - 1.15 - - - Assoc. INR Date - 05/23/2019 06/07/2019 06/30/2019 Associated INR - 2.34 2.34 2.81 Instructions - 2.5 mg every Thu; 5 mg all other days 2.5 mg every Thu; 5 mg all other days 2.5 mg every Thu; 5 mg all other days Last 7 day dose total - 32.5 mg 32.5 mg 32.5 mg Next INR Date - 06/06/2019 06/21/2019 07/28/2019 Findings from today's call:Patient Findings   Negatives:  Signs/symptoms of thrombosis, Signs/symptoms of bleeding, Laboratory test error suspected, Change in health, Change in alcohol use, Change in activity, Upcoming invasive procedure, Emergency department visit, Upcoming dental procedure, Missed doses, Extra doses, Change in medications, Change in diet/appetite, Hospital admission, Bruising, Other complaints  Assessment/Plan:Patient's INR is Therapeutic.  INR = 2.81. Patient's chart was reviewed and voice message was left instructing patient to call the clinic if there have been any changes to prescription or over the counter medications or diet, if experiencing a current illness, if there are any planned upcoming invasive/dental procedures or any other changes that could affect warfarin therapy. Based on the [facility] Ambulatory Warfarin Protocol, she was instructed to continue current weekly dose as indicated in the maintenance plan below.  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  Electronically Signed by [name], PharmD, March 5, 2021[facility]  [address] [place], [place] [address]

## 2019-07-06 NOTE — Progress Notes
A user error has taken place: encounter opened in error, closed for administrative reasons.

## 2019-08-01 ENCOUNTER — Ambulatory Visit: Admit: 2019-08-01 | Payer: PRIVATE HEALTH INSURANCE | Attending: Pharmacotherapy | Primary: Internal Medicine

## 2019-08-01 ENCOUNTER — Inpatient Hospital Stay: Admit: 2019-08-01 | Discharge: 2019-08-01 | Payer: PRIVATE HEALTH INSURANCE | Primary: Internal Medicine

## 2019-08-01 ENCOUNTER — Encounter: Admit: 2019-08-01 | Payer: PRIVATE HEALTH INSURANCE | Attending: Pharmacotherapy | Primary: Internal Medicine

## 2019-08-01 DIAGNOSIS — E119 Type 2 diabetes mellitus without complications: Secondary | ICD-10-CM

## 2019-08-01 DIAGNOSIS — Z7901 Long term (current) use of anticoagulants: Secondary | ICD-10-CM

## 2019-08-01 DIAGNOSIS — E7800 Pure hypercholesterolemia, unspecified: Secondary | ICD-10-CM

## 2019-08-01 DIAGNOSIS — D219 Benign neoplasm of connective and other soft tissue, unspecified: Secondary | ICD-10-CM

## 2019-08-01 DIAGNOSIS — Z5181 Encounter for therapeutic drug level monitoring: Secondary | ICD-10-CM

## 2019-08-01 DIAGNOSIS — F32A Depression: Secondary | ICD-10-CM

## 2019-08-01 DIAGNOSIS — R42 Dizziness and giddiness: Secondary | ICD-10-CM

## 2019-08-01 DIAGNOSIS — K559 Vascular disorder of intestine, unspecified: Secondary | ICD-10-CM

## 2019-08-01 DIAGNOSIS — R112 Nausea with vomiting, unspecified: Secondary | ICD-10-CM

## 2019-08-01 DIAGNOSIS — L719 Rosacea, unspecified: Secondary | ICD-10-CM

## 2019-08-01 DIAGNOSIS — D6862 Lupus anticoagulant syndrome: Secondary | ICD-10-CM

## 2019-08-01 DIAGNOSIS — K436 Other and unspecified ventral hernia with obstruction, without gangrene: Secondary | ICD-10-CM

## 2019-08-01 DIAGNOSIS — K55039 Acute (reversible) ischemia of large intestine, extent unspecified: Secondary | ICD-10-CM

## 2019-08-01 DIAGNOSIS — Z98811 Dental restoration status: Secondary | ICD-10-CM

## 2019-08-01 DIAGNOSIS — K219 Gastro-esophageal reflux disease without esophagitis: Secondary | ICD-10-CM

## 2019-08-01 LAB — PROTIME AND INR
BKR INR: 1.51 — ABNORMAL HIGH (ref 0.90–1.10)
BKR PROTHROMBIN TIME: 16.2 s — ABNORMAL HIGH (ref 10.3–11.7)

## 2019-08-01 NOTE — Progress Notes
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  The patient was contacted regarding lab drawn INR results.  Patient's INR findings and prescribed dose from last visit:Anticoagulation Monitoring Latest Ref Rng & Units 06/07/2019 06/30/2019 08/01/2019 INR 2 - 3 - - - INR 0.91 - 1.10 - - - INR (External) 0.8 - 1.15 - - - Assoc. INR Date - 06/07/2019 06/30/2019 08/01/2019 Associated INR - 2.34 2.81 1.51 Instructions - 2.5 mg every Thu; 5 mg all other days 2.5 mg every Thu; 5 mg all other days 5 mg every day Last 7 day dose total - 32.5 mg 32.5 mg 32.5 mg Next INR Date - 06/21/2019 07/28/2019 08/08/2019 Findings from today's call:Left voice message asking that patient report any changes in diet, medications, health or upcoming procedures.Side effects reported:Left voice message asking patient to call the clinic if experiencing symptoms of bleeding/clotting.Assessment/Plan:Patient's INR is 1.51, which is below goal range of 2.0-3.0.  She was instructed to increase weekly warfarin dose about 8% from 32.5mg /wk to 35mg /wk as indicated in the maintenance plan below.  Next INR assessment due 1 week.  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  Electronically Signed by Markus Daft, RPH, April 6, 2021[facility]  [address] [place], [place] [address]

## 2019-08-09 ENCOUNTER — Telehealth: Admit: 2019-08-09 | Payer: PRIVATE HEALTH INSURANCE | Primary: Internal Medicine

## 2019-08-09 NOTE — Telephone Encounter
PHARMACY NOTE: INR REMINDER MISSED CALL 	I have left a voicemail for [name] on 08/09/2019 at 2:41 PM with a reminder that patient is due for an INR lab test based on the referral for anticoagulation monitoring. This is the first week of missed calls. I have also notified the ambulatory care pharmacist   as appropriate.[name], CPHTMedication History Technician ll- Ambulatory Care TechnicianPharmacy Department4/14/20212:41 PM

## 2019-08-11 ENCOUNTER — Ambulatory Visit
Admit: 2019-08-11 | Payer: PRIVATE HEALTH INSURANCE | Attending: Pharmacist Clinician (PhC)/ Clinical Pharmacy Specialist | Primary: Internal Medicine

## 2019-08-11 ENCOUNTER — Inpatient Hospital Stay: Admit: 2019-08-11 | Discharge: 2019-08-11 | Payer: PRIVATE HEALTH INSURANCE | Primary: Internal Medicine

## 2019-08-11 DIAGNOSIS — K559 Vascular disorder of intestine, unspecified: Secondary | ICD-10-CM

## 2019-08-11 DIAGNOSIS — Z5181 Encounter for therapeutic drug level monitoring: Secondary | ICD-10-CM

## 2019-08-11 DIAGNOSIS — D6862 Lupus anticoagulant syndrome: Secondary | ICD-10-CM

## 2019-08-11 DIAGNOSIS — Z7901 Long term (current) use of anticoagulants: Secondary | ICD-10-CM

## 2019-08-11 LAB — PROTIME AND INR
BKR INR: 1.49 — ABNORMAL HIGH (ref 0.90–1.10)
BKR PROTHROMBIN TIME: 16 s — ABNORMAL HIGH (ref 10.3–11.7)

## 2019-08-11 NOTE — Progress Notes
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  The patient was contacted regarding lab drawn INR results.  Patient's INR findings and prescribed dose from last visit:Anticoagulation Monitoring Latest Ref Rng & Units 06/30/2019 08/01/2019 08/11/2019 INR 2 - 3 - - - INR 0.91 - 1.10 - - - INR (External) 0.8 - 1.15 - - - Assoc. INR Date - 06/30/2019 08/01/2019 08/11/2019 Associated INR - 2.81 1.51 1.49 Instructions - 2.5 mg every Thu; 5 mg all other days 5 mg every day 7.5 mg every Fri; 5 mg all other days Last 7 day dose total - 32.5 mg 32.5 mg 35 mg Next INR Date - 07/28/2019 08/08/2019 08/16/2019 Findings from today's call:Patient does not report any missed doses, extra doses, change in medications, changes in vitamin K rich food intake, changes in alcohol use, changes in overall health, recent hospitalization or ED visit, or upcoming procedures. Patient has been struggling caring for her mother in law and trying to get COVID vaccine. Patient is more active recently, working in the garden. Side effects reported:Patient does not report any bleeding, new bruising, or warfarin related ED visits or hospital admissions.Assessment/Plan:Patient's INR is 1.49, which is below goal range of 2.0-3.0.  INR may be low due to increased physical activity level. She was instructed to increase weekly dose by 7% as indicated in the maintenance plan below. Next INR assessment due 1 week.Anticoagulation Episode Summary   Current INR goal:  2.0-3.0 TTR:  47.5 % (5.2 y) Next INR check:  08/16/2019 INR from last check:  1.49 (08/11/2019) Weekly max warfarin dose:   Target end date:  Indefinite INR check location:   Preferred lab: Send INR reminders to:  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  Electronically Signed by [name], PharmD, April 16, 2021[facility]  [address], [place], [place] [address]

## 2019-08-17 ENCOUNTER — Telehealth: Admit: 2019-08-17 | Payer: PRIVATE HEALTH INSURANCE | Primary: Internal Medicine

## 2019-08-17 NOTE — Telephone Encounter
PHARMACY NOTE: INR REMINDER MISSED CALL 	I have left a voicemail for [name] on 08/17/2019 at 1:55 PM with a reminder that patient is due for an INR lab test based on the referral for anticoagulation monitoring. This is the first week of missed calls. I have also notified the ambulatory care pharmacist   as appropriate.[name], CPHTMedication History Technician ll- Ambulatory Care TechnicianPharmacy Department4/22/20211:55 PM

## 2019-08-18 ENCOUNTER — Ambulatory Visit: Admit: 2019-08-18 | Payer: PRIVATE HEALTH INSURANCE | Attending: Medical Oncology | Primary: Internal Medicine

## 2019-08-18 ENCOUNTER — Inpatient Hospital Stay: Admit: 2019-08-18 | Discharge: 2019-08-18 | Payer: PRIVATE HEALTH INSURANCE | Primary: Internal Medicine

## 2019-08-18 ENCOUNTER — Encounter: Admit: 2019-08-18 | Payer: PRIVATE HEALTH INSURANCE | Attending: Medical Oncology | Primary: Internal Medicine

## 2019-08-18 ENCOUNTER — Ambulatory Visit
Admit: 2019-08-18 | Payer: PRIVATE HEALTH INSURANCE | Attending: Pharmacist Clinician (PhC)/ Clinical Pharmacy Specialist | Primary: Internal Medicine

## 2019-08-18 DIAGNOSIS — Z5181 Encounter for therapeutic drug level monitoring: Secondary | ICD-10-CM

## 2019-08-18 DIAGNOSIS — K559 Vascular disorder of intestine, unspecified: Secondary | ICD-10-CM

## 2019-08-18 DIAGNOSIS — D6862 Lupus anticoagulant syndrome: Secondary | ICD-10-CM

## 2019-08-18 DIAGNOSIS — Z7901 Long term (current) use of anticoagulants: Secondary | ICD-10-CM

## 2019-08-18 LAB — PROTIME AND INR
BKR INR: 2.68 — ABNORMAL HIGH (ref 0.90–1.10)
BKR PROTHROMBIN TIME: 27.6 s — ABNORMAL HIGH (ref 10.3–11.7)

## 2019-08-18 NOTE — Progress Notes
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  The patient was contacted regarding lab drawn INR results.  Patient's INR findings and prescribed dose from last visit:Anticoagulation Monitoring Latest Ref Rng & Units 08/01/2019 08/11/2019 08/18/2019 INR 2 - 3 - - - INR 0.91 - 1.10 - - - INR (External) 0.8 - 1.15 - - - Assoc. INR Date - 08/01/2019 08/11/2019 08/18/2019 Associated INR - 1.51 1.49 2.68 Instructions - 5 mg every day 7.5 mg every Fri; 5 mg all other days 7.5 mg every Fri; 5 mg all other days Last 7 day dose total - 32.5 mg 35 mg 37.5 mg Next INR Date - 08/08/2019 08/16/2019 09/01/2019 Findings from today's call:Unable to assess, left voice message. Side effects reported:Unable to assess, left voice message. Assessment/Plan:Patient's INR is 2.68, which is within goal range of 2.0-3.0.  Last week her dose was adjusted for a low INR which was thought to be due to increased physical activity.  She was instructed to continue current weekly dose as indicated in the maintenance plan below.  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  Electronically Signed by [name], PharmD, April 23, 2021[facility]  [address] [place], [place] [address]

## 2019-09-01 ENCOUNTER — Inpatient Hospital Stay: Admit: 2019-09-01 | Discharge: 2019-09-01 | Payer: PRIVATE HEALTH INSURANCE | Primary: Internal Medicine

## 2019-09-01 ENCOUNTER — Ambulatory Visit: Admit: 2019-09-01 | Payer: PRIVATE HEALTH INSURANCE | Attending: Pharmacotherapy | Primary: Internal Medicine

## 2019-09-01 ENCOUNTER — Encounter: Admit: 2019-09-01 | Payer: PRIVATE HEALTH INSURANCE | Attending: Pharmacotherapy | Primary: Internal Medicine

## 2019-09-01 DIAGNOSIS — D6862 Lupus anticoagulant syndrome: Secondary | ICD-10-CM

## 2019-09-01 DIAGNOSIS — K559 Vascular disorder of intestine, unspecified: Secondary | ICD-10-CM

## 2019-09-01 DIAGNOSIS — K219 Gastro-esophageal reflux disease without esophagitis: Secondary | ICD-10-CM

## 2019-09-01 DIAGNOSIS — Z7901 Long term (current) use of anticoagulants: Secondary | ICD-10-CM

## 2019-09-01 DIAGNOSIS — R112 Nausea with vomiting, unspecified: Secondary | ICD-10-CM

## 2019-09-01 DIAGNOSIS — Z98811 Dental restoration status: Secondary | ICD-10-CM

## 2019-09-01 DIAGNOSIS — K436 Other and unspecified ventral hernia with obstruction, without gangrene: Secondary | ICD-10-CM

## 2019-09-01 DIAGNOSIS — F32A Depression: Secondary | ICD-10-CM

## 2019-09-01 DIAGNOSIS — D219 Benign neoplasm of connective and other soft tissue, unspecified: Secondary | ICD-10-CM

## 2019-09-01 DIAGNOSIS — K55039 Acute (reversible) ischemia of large intestine, extent unspecified: Secondary | ICD-10-CM

## 2019-09-01 DIAGNOSIS — L719 Rosacea, unspecified: Secondary | ICD-10-CM

## 2019-09-01 DIAGNOSIS — R42 Dizziness and giddiness: Secondary | ICD-10-CM

## 2019-09-01 DIAGNOSIS — E7800 Pure hypercholesterolemia, unspecified: Secondary | ICD-10-CM

## 2019-09-01 DIAGNOSIS — Z5181 Encounter for therapeutic drug level monitoring: Secondary | ICD-10-CM

## 2019-09-01 DIAGNOSIS — E119 Type 2 diabetes mellitus without complications: Secondary | ICD-10-CM

## 2019-09-01 LAB — PROTIME AND INR
BKR INR: 1.89 — ABNORMAL HIGH (ref 0.90–1.10)
BKR PROTHROMBIN TIME: 19.9 s — ABNORMAL HIGH (ref 10.3–11.7)

## 2019-09-01 NOTE — Progress Notes
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  The patient was contacted regarding lab drawn INR results.  Patient's INR findings and prescribed dose from last visit:Anticoagulation Monitoring Latest Ref Rng & Units 08/11/2019 08/18/2019 09/01/2019 INR 2 - 3 - - - INR 0.91 - 1.10 - - - INR (External) 0.8 - 1.15 - - - Assoc. INR Date - 08/11/2019 08/18/2019 09/01/2019 Associated INR - 1.49 2.68 1.89 Instructions - 7.5 mg every Fri; 5 mg all other days 7.5 mg every Fri; 5 mg all other days 7.5 mg every Fri; 5 mg all other days Last 7 day dose total - 35 mg 37.5 mg 37.5 mg Next INR Date - 08/16/2019 09/01/2019 09/08/2019 Findings from today's call:Patient does not report any missed doses, extra doses, change in medications, changes in vitamin K rich food intake, changes in alcohol use, changes in overall health, recent hospitalization or ED visit, or upcoming procedures.Side effects reported:Patient does not report any bleeding, new bruising, or warfarin related ED visits or hospital admissions.Assessment/Plan:Patient's INR is 1.89, which is within goal range of 2.0-3.0.  She was instructed to continue current weekly dose as indicated in the maintenance plan below.  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  Electronically Signed by Markus Daft, RPH, May 7, 2021[facility]  [address] [place], [place] [address]

## 2019-09-08 ENCOUNTER — Inpatient Hospital Stay: Admit: 2019-09-08 | Discharge: 2019-09-08 | Payer: PRIVATE HEALTH INSURANCE | Primary: Internal Medicine

## 2019-09-08 DIAGNOSIS — D6862 Lupus anticoagulant syndrome: Secondary | ICD-10-CM

## 2019-09-08 DIAGNOSIS — K559 Vascular disorder of intestine, unspecified: Secondary | ICD-10-CM

## 2019-09-08 DIAGNOSIS — Z7901 Long term (current) use of anticoagulants: Secondary | ICD-10-CM

## 2019-09-08 DIAGNOSIS — Z5181 Encounter for therapeutic drug level monitoring: Secondary | ICD-10-CM

## 2019-09-08 LAB — PROTIME AND INR
BKR INR: 2.55 — ABNORMAL HIGH (ref 0.90–1.10)
BKR PROTHROMBIN TIME: 26.4 s — ABNORMAL HIGH (ref 10.3–11.7)

## 2019-09-11 ENCOUNTER — Ambulatory Visit: Admit: 2019-09-11 | Payer: PRIVATE HEALTH INSURANCE | Attending: Pharmacotherapy | Primary: Internal Medicine

## 2019-09-11 ENCOUNTER — Encounter: Admit: 2019-09-11 | Payer: PRIVATE HEALTH INSURANCE | Attending: Pharmacotherapy | Primary: Internal Medicine

## 2019-09-11 DIAGNOSIS — D219 Benign neoplasm of connective and other soft tissue, unspecified: Secondary | ICD-10-CM

## 2019-09-11 DIAGNOSIS — F32A Depression: Secondary | ICD-10-CM

## 2019-09-11 DIAGNOSIS — Z98811 Dental restoration status: Secondary | ICD-10-CM

## 2019-09-11 DIAGNOSIS — E7800 Pure hypercholesterolemia, unspecified: Secondary | ICD-10-CM

## 2019-09-11 DIAGNOSIS — K436 Other and unspecified ventral hernia with obstruction, without gangrene: Secondary | ICD-10-CM

## 2019-09-11 DIAGNOSIS — K559 Vascular disorder of intestine, unspecified: Secondary | ICD-10-CM

## 2019-09-11 DIAGNOSIS — R112 Nausea with vomiting, unspecified: Secondary | ICD-10-CM

## 2019-09-11 DIAGNOSIS — E119 Type 2 diabetes mellitus without complications: Secondary | ICD-10-CM

## 2019-09-11 DIAGNOSIS — R42 Dizziness and giddiness: Secondary | ICD-10-CM

## 2019-09-11 DIAGNOSIS — K55039 Acute (reversible) ischemia of large intestine, extent unspecified: Secondary | ICD-10-CM

## 2019-09-11 DIAGNOSIS — K219 Gastro-esophageal reflux disease without esophagitis: Secondary | ICD-10-CM

## 2019-09-11 DIAGNOSIS — D6862 Lupus anticoagulant syndrome: Secondary | ICD-10-CM

## 2019-09-11 DIAGNOSIS — L719 Rosacea, unspecified: Secondary | ICD-10-CM

## 2019-09-11 NOTE — Progress Notes
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  The patient was contacted regarding lab drawn INR results.  Patient's INR findings and prescribed dose from last visit:Anticoagulation Monitoring Latest Ref Rng & Units 08/18/2019 09/01/2019 09/11/2019 INR 2 - 3 - - - INR 0.91 - 1.10 - - - INR (External) 0.8 - 1.15 - - - Assoc. INR Date - 08/18/2019 09/01/2019 09/08/2019 Associated INR - 2.68 1.89 2.55 Instructions - 7.5 mg every Fri; 5 mg all other days 7.5 mg every Fri; 5 mg all other days 7.5 mg every Fri; 5 mg all other days Last 7 day dose total - 37.5 mg 37.5 mg 37.5 mg Next INR Date - 09/01/2019 09/08/2019 09/22/2019 Findings from today's call:LVM asking patient to contact the clinic if any missed doses, extra doses of warfarin, change in medications, changes in vitamin K rich food intake, changes in alcohol use, changes in overall health, recent hospitalization or ED visit, or upcoming procedures.Side effects reported:LVM asking patient to contact the clinic if experiencing any bleeding, new bruising, or warfarin related ED visits or hospital admissionAssessment/Plan:Patient's INR is 2.55, which is within goal range of 2.0-3.0.  Patient's chart was reviewed and voice message was left instructing patient to call the clinic if there have been any changes to prescription or over the counter medications or diet, if experiencing a current illness, if there are any planned upcoming invasive/dental procedures or any other changes that could affect warfarin therapy.[name]  She was instructed to continue current weekly dose as indicated in the maintenance plan below.  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  Electronically Signed by Markus Daft, RPH, May 17, 2021[facility]  [address] [place], [place] [address]

## 2019-09-13 ENCOUNTER — Emergency Department: Admit: 2019-09-13 | Payer: PRIVATE HEALTH INSURANCE | Primary: Internal Medicine

## 2019-09-13 ENCOUNTER — Inpatient Hospital Stay: Admit: 2019-09-13 | Discharge: 2019-09-13 | Payer: PRIVATE HEALTH INSURANCE

## 2019-09-13 DIAGNOSIS — Z7984 Long term (current) use of oral hypoglycemic drugs: Secondary | ICD-10-CM

## 2019-09-13 DIAGNOSIS — Z91040 Latex allergy status: Secondary | ICD-10-CM

## 2019-09-13 DIAGNOSIS — M7989 Other specified soft tissue disorders: Secondary | ICD-10-CM

## 2019-09-13 DIAGNOSIS — Z79899 Other long term (current) drug therapy: Secondary | ICD-10-CM

## 2019-09-13 DIAGNOSIS — Z885 Allergy status to narcotic agent status: Secondary | ICD-10-CM

## 2019-09-13 DIAGNOSIS — M25561 Pain in right knee: Secondary | ICD-10-CM

## 2019-09-13 DIAGNOSIS — Z881 Allergy status to other antibiotic agents status: Secondary | ICD-10-CM

## 2019-09-13 DIAGNOSIS — Z7901 Long term (current) use of anticoagulants: Secondary | ICD-10-CM

## 2019-09-13 DIAGNOSIS — E7800 Pure hypercholesterolemia, unspecified: Secondary | ICD-10-CM

## 2019-09-13 DIAGNOSIS — E119 Type 2 diabetes mellitus without complications: Secondary | ICD-10-CM

## 2019-09-13 NOTE — ED Notes
11:18 AM Pt discharged by provider before RN assessment.

## 2019-09-13 NOTE — Discharge Instructions
Take 1000 mg of Tylenol every 6 hours for pain control.  Apply Voltaren gel over the kneeFollow-up with the orthopedic doctorIf he develops a fever increase in redness or swelling please get re-evaluated

## 2019-09-13 NOTE — ED Provider Notes
HistoryChief Complaint Patient presents with ? Knee Pain   pt c/o pain right knee with weight bearing  x 1 week denies injury hx ligament surg 16 years ago   The history is provided by the patient and medical records. No language interpreter was used. Knee PainLocation:  KneeTime since incident:  1 weekInjury: no  Knee location:  R kneeChronicity:  NewDislocation: no  Foreign body present:  No foreign bodiesTetanus status:  UnknownPrior injury to area:  Yes (Seventeen years ago, ligamental injury)Relieved by:  NothingWorsened by:  Bearing weightIneffective treatments:  AcetaminophenAssociated symptoms: swelling  Associated symptoms: no fever   Past Medical History: Diagnosis Date ? Acute ischemic colitis (HC Code) 2015 ? Dental crown present   UPPER FRONT CAP ? Depression   Receving treatment at [facility] ? Diabetes mellitus (HC Code)  ? Fibroid   2003 ? GERD (gastroesophageal reflux disease)   SINCE WT LOSS AND DIET CHANGE PT NO LONGER HAS PROBLEMS WITH GERD ? Hypercholesteremia  ? PONV (postoperative nausea and vomiting)  ? Rosacea  ? Ventral hernia with bowel obstruction  ? Vertigo  Past Surgical History: Procedure Laterality Date ? ABDOMINAL ADHESION SURGERY  2016 ? BLEPHAROPLASTY Bilateral 05/2016 ? COLOSTOMY  05/27/13 ? COLOSTOMY CLOSURE   ? KNEE LIGAMENT RECONSTRUCTION Right 2004 ? LAPAROSCOPIC NISSEN FUNDOPLICATION    2000 ? ? SINUS SURGERY   ? THYROID BIOPSY    NEGATIVE PER PATIENT ? TUBAL LIGATION   ? UMBILICAL HERNIA REPAIR   ? VENTRAL HERNIA REPAIR   Family History Problem Relation Age of Onset ? Cancer Mother       bladder cancer ? Bladder cancer Mother  ? Colon cancer Paternal Aunt  ? Colon cancer Cousin       died of colona cancer at age 2 ? Breast cancer Cousin       2 cousins with breast cancer ? Colon cancer Father  ? Cancer Sister  Social History Socioeconomic History ? Marital status: Married   Spouse name: Not on file ? Number of children: Not on file ? Years of education: Not on file ? Highest education level: Not on file Tobacco Use ? Smoking status: Never Smoker ? Smokeless tobacco: Never Used Substance and Sexual Activity ? Alcohol use: Yes   Comment: once or twice a year ? Drug use: No ? Sexual activity: Yes   Partners: Male   Comment: with husband Social History Narrative  10/23/14:  Lives w/ husband and family in a 2 story home w/ 2 sets of 11 stairs w/ 1 rail to enter. She was indep w/ ADLs and amb w/out AD.     ED Other Social History ? E-cigarette status Never User  ? E-Cigarette Use Never User  E-cigarette/Vaping Substances E-cigarette/Vaping Devices Review of Systems Constitutional: Negative for chills and fever. Respiratory: Negative for shortness of breath.  Cardiovascular: Negative for chest pain. Musculoskeletal: Positive for arthralgias, gait problem, joint swelling and myalgias. Neurological: Negative for weakness. All other systems reviewed and are negative. Physical ExamED Triage Vitals [09/13/19 1009]BP: (!) 144/74Pulse: (!) 97Pulse from  O2 sat: n/aResp: 18Temp: 97 ?F (36.1 ?C)Temp src: TemporalSpO2: 97 % BP (!) 144/74  - Pulse (!) 97  - Temp 97 ?F (36.1 ?C) (Temporal)  - Resp 18  - Wt 69.9 kg  - SpO2 97%  - BMI 31.10 kg/m? Physical ExamVitals signs and nursing note reviewed. Constitutional:     General: She is not in acute distress.   Appearance: Normal appearance. HENT:    Head:  Normocephalic and atraumatic.    Nose: Nose normal. Eyes:    Extraocular Movements: Extraocular movements intact. Neck:    Musculoskeletal: Normal range of motion and neck supple. Cardiovascular:    Rate and Rhythm: Normal rate. Pulmonary:    Effort: Pulmonary effort is normal. Musculoskeletal: Normal range of motion. General: Tenderness (Over medial aspect of right knee) present. No deformity.    Comments: Right knee slightly more swollen than left knee without overlying skin changes such as erythema or warmthMild popliteal fossa tendernessMaintains range of motion, strength, distal sensationAmbulates with a steady, mildly antalgic gait Skin:   General: Skin is warm.    Capillary Refill: Capillary refill takes less than 2 seconds.    Findings: No rash. Neurological:    Mental Status: She is alert and oriented to person, place, and time. Mental status is at baseline. Psychiatric:       Mood and Affect: Mood normal.       Thought Content: Thought content normal.  ProceduresProceduresResident/APP MDM:69 year old female past medical history of depression, diabetes, neuropathy, vertigo, hyperlipidemia presents today with complaint of right knee pain for approximately 1 week.  She states that she has been gardening and walking on it more often over the last week and thinks that might be contributing to her pain.  She notes a remote history of ligamental and knee surgery approximately 17 years ago s/p a fall.  She has been taking Tylenol for pain control, last dose last night.  Denies tick bites or falls.  Denies history of arthritisOn physical exam she is well appearing, no acute distress.  Her right knee is mildly swollen in comparison to the left.  No erythema or warmth. Mild tenderness over the medial aspect of the right knee.  Maintains range of motion and strength.  No joint instability.  DP 2+.Ddx:  Suspect osteoarthritis, doubt acute fracture as no trauma, doubt ligamental injury as no instability, doubt tick-borne illness, doubt gout, no concern for acute infectionPlan:  X-ray knee, patient denying pain medication at this time11:02 AM X-ray negative for acute fracture or dislocation.  No joint effusion.  Does not make note of joint spacesDiscussed with patient pain control at home with Tylenol and Voltaren gel which she already has and follow-up with orthopedic doctor.  She still follows up that clinic and is agreeable to this planDr. baloescu was available for consultationED CourseClinical Impressions as of Sep 12 1101 Acute pain of right knee  ED DispositionDischarge Raymond Gurney, PA05/19/21 1103 Kayleen Memos Toppers, PA05/19/21 [phone]

## 2019-09-22 ENCOUNTER — Encounter: Admit: 2019-09-22 | Payer: PRIVATE HEALTH INSURANCE | Attending: Medical Oncology | Primary: Internal Medicine

## 2019-09-22 ENCOUNTER — Ambulatory Visit
Admit: 2019-09-22 | Payer: PRIVATE HEALTH INSURANCE | Attending: Pharmacist Clinician (PhC)/ Clinical Pharmacy Specialist | Primary: Internal Medicine

## 2019-09-22 ENCOUNTER — Inpatient Hospital Stay: Admit: 2019-09-22 | Discharge: 2019-09-22 | Payer: PRIVATE HEALTH INSURANCE | Primary: Internal Medicine

## 2019-09-22 DIAGNOSIS — D6862 Lupus anticoagulant syndrome: Secondary | ICD-10-CM

## 2019-09-22 DIAGNOSIS — Z5181 Encounter for therapeutic drug level monitoring: Secondary | ICD-10-CM

## 2019-09-22 DIAGNOSIS — Z7901 Long term (current) use of anticoagulants: Secondary | ICD-10-CM

## 2019-09-22 DIAGNOSIS — K559 Vascular disorder of intestine, unspecified: Secondary | ICD-10-CM

## 2019-09-22 LAB — PROTIME AND INR
BKR INR: 3.07 — ABNORMAL HIGH (ref 0.90–1.10)
BKR PROTHROMBIN TIME: 31.4 s — ABNORMAL HIGH (ref 10.3–11.7)

## 2019-09-22 NOTE — Progress Notes
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  The patient was contacted regarding lab drawn INR results.  Patient's INR findings and prescribed dose from last visit:Anticoagulation Monitoring Latest Ref Rng & Units 09/01/2019 09/11/2019 09/22/2019 INR 2 - 3 - - - INR 0.91 - 1.10 - - - INR (External) 0.8 - 1.15 - - - Assoc. INR Date - 09/01/2019 09/08/2019 09/22/2019 Associated INR - 1.89 2.55 3.07 Instructions - 7.5 mg every Fri; 5 mg all other days 7.5 mg every Fri; 5 mg all other days 7.5 mg every Fri; 5 mg all other days Last 7 day dose total - 37.5 mg 37.5 mg 37.5 mg Next INR Date - 09/08/2019 09/22/2019 10/02/2019 Findings from today's call:Unable to assess, left voice message. Side effects reported:Unable to assess, left voice message. Assessment/Plan:Patient's INR is 3.07, which is above goal range of 2.0-3.0.  Patient's chart was reviewed and voice message was left instructing patient to call the clinic if there have been any changes to prescription or over the counter medications or diet, if experiencing a current illness, if there are any planned upcoming invasive/dental procedures or any other changes that could affect warfarin therapy.  She was instructed to continue current weekly dose as indicated in the maintenance plan below.  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  Electronically Signed by [name], PharmD, May 28, 2021[facility]  [address] [place], [place] [address]  Phone:[phone]  Fax: [phone]

## 2019-10-06 ENCOUNTER — Telehealth: Admit: 2019-10-06 | Payer: PRIVATE HEALTH INSURANCE | Primary: Internal Medicine

## 2019-10-06 NOTE — Telephone Encounter
PHARMACY NOTE: INR REMINDER MISSED CALL 	I have left a voicemail for [name] on 10/06/2019 at 2:42 PM with a reminder that patient is due for an INR lab test based on the referral for anticoagulation monitoring. This is the first week of missed calls. I have also notified the ambulatory care pharmacist   as appropriate.[name], CPHTMedication History Technician ll- Ambulatory Care TechnicianPharmacy Department6/11/20212:42 PM

## 2019-10-09 ENCOUNTER — Ambulatory Visit: Admit: 2019-10-09 | Payer: PRIVATE HEALTH INSURANCE | Attending: Pharmacotherapy | Primary: Internal Medicine

## 2019-10-09 ENCOUNTER — Inpatient Hospital Stay: Admit: 2019-10-09 | Discharge: 2019-10-09 | Payer: PRIVATE HEALTH INSURANCE | Primary: Internal Medicine

## 2019-10-09 ENCOUNTER — Encounter: Admit: 2019-10-09 | Payer: PRIVATE HEALTH INSURANCE | Attending: Medical Oncology | Primary: Internal Medicine

## 2019-10-09 ENCOUNTER — Encounter: Admit: 2019-10-09 | Payer: PRIVATE HEALTH INSURANCE | Attending: Pharmacotherapy | Primary: Internal Medicine

## 2019-10-09 DIAGNOSIS — L719 Rosacea, unspecified: Secondary | ICD-10-CM

## 2019-10-09 DIAGNOSIS — D219 Benign neoplasm of connective and other soft tissue, unspecified: Secondary | ICD-10-CM

## 2019-10-09 DIAGNOSIS — R112 Nausea with vomiting, unspecified: Secondary | ICD-10-CM

## 2019-10-09 DIAGNOSIS — Z7901 Long term (current) use of anticoagulants: Secondary | ICD-10-CM

## 2019-10-09 DIAGNOSIS — Z5181 Encounter for therapeutic drug level monitoring: Secondary | ICD-10-CM

## 2019-10-09 DIAGNOSIS — F32A Depression: Secondary | ICD-10-CM

## 2019-10-09 DIAGNOSIS — K559 Vascular disorder of intestine, unspecified: Secondary | ICD-10-CM

## 2019-10-09 DIAGNOSIS — K219 Gastro-esophageal reflux disease without esophagitis: Secondary | ICD-10-CM

## 2019-10-09 DIAGNOSIS — D6862 Lupus anticoagulant syndrome: Secondary | ICD-10-CM

## 2019-10-09 DIAGNOSIS — R42 Dizziness and giddiness: Secondary | ICD-10-CM

## 2019-10-09 DIAGNOSIS — E119 Type 2 diabetes mellitus without complications: Secondary | ICD-10-CM

## 2019-10-09 DIAGNOSIS — K55039 Acute (reversible) ischemia of large intestine, extent unspecified: Secondary | ICD-10-CM

## 2019-10-09 DIAGNOSIS — E7800 Pure hypercholesterolemia, unspecified: Secondary | ICD-10-CM

## 2019-10-09 DIAGNOSIS — Z98811 Dental restoration status: Secondary | ICD-10-CM

## 2019-10-09 DIAGNOSIS — K436 Other and unspecified ventral hernia with obstruction, without gangrene: Secondary | ICD-10-CM

## 2019-10-09 LAB — PROTIME AND INR
BKR INR: 3.3 — ABNORMAL HIGH (ref 0.90–1.10)
BKR PROTHROMBIN TIME: 33.6 s — ABNORMAL HIGH (ref 10.3–11.7)

## 2019-10-10 ENCOUNTER — Encounter: Admit: 2019-10-10 | Payer: PRIVATE HEALTH INSURANCE | Attending: Surgery | Primary: Internal Medicine

## 2019-10-10 DIAGNOSIS — Z9889 Other specified postprocedural states: Secondary | ICD-10-CM

## 2019-10-11 ENCOUNTER — Telehealth
Admit: 2019-10-11 | Payer: PRIVATE HEALTH INSURANCE | Attending: Pharmacist Clinician (PhC)/ Clinical Pharmacy Specialist | Primary: Internal Medicine

## 2019-10-11 NOTE — Progress Notes
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  The patient was contacted regarding lab drawn INR results.  Patient's INR findings and prescribed dose from last visit:Anticoagulation Monitoring Latest Ref Rng & Units 09/11/2019 09/22/2019 10/09/2019 INR 2 - 3 - - - INR 0.91 - 1.10 - - - INR (External) 0.8 - 1.15 - - - Assoc. INR Date - 09/08/2019 09/22/2019 10/09/2019 Associated INR - 2.55 3.07 3.30 Instructions - 7.5 mg every Fri; 5 mg all other days 7.5 mg every Fri; 5 mg all other days 5 mg every day Last 7 day dose total - 37.5 mg 37.5 mg 37.5 mg Next INR Date - 09/22/2019 10/02/2019 10/16/2019 Findings from today's call:Patient does not report any missed doses, extra doses, change in medications, changes in vitamin K rich food intake, changes in alcohol use, changes in overall health, recent hospitalization or ED visit, or upcoming procedures.Side effects reported:Patient does not report any bleeding, new bruising, or warfarin related ED visits or hospital admissions.Assessment/Plan:Patient's INR is 3.3, which is above goal range of 2.0-3.0.  Patient's chart was reviewed and voice message was left instructing patient to call the clinic if there have been any changes to prescription or over the counter medications or diet, if experiencing a current illness, if there are any planned upcoming invasive/dental procedures or any other changes that could affect warfarin therapy.  She was instructed to decrease weekly warfarin dose about 6.7% (based on tablet strength) to 5mg  daily as indicated in the maintenance plan below.  Next INR assessment due 1 week.Addendum: patient will be going away for 2 weeks on 10/25/19. 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  Electronically Signed by Maria [name], RPH, June 14, 2021[facility]  [address], [place], [place] [address]  Phone:[phone]  Fax: [phone]

## 2019-10-11 NOTE — Telephone Encounter
Patient left a message saying that she wasn't able to hear our messages about her INR result from Monday. Called back with instructions (please see most recent anticoag note). She will be going away for 2 weeks on 10/25/19.

## 2019-10-16 ENCOUNTER — Inpatient Hospital Stay: Admit: 2019-10-16 | Discharge: 2019-10-16 | Payer: PRIVATE HEALTH INSURANCE | Primary: Internal Medicine

## 2019-10-16 ENCOUNTER — Ambulatory Visit
Admit: 2019-10-16 | Payer: PRIVATE HEALTH INSURANCE | Attending: Pharmacist Clinician (PhC)/ Clinical Pharmacy Specialist | Primary: Internal Medicine

## 2019-10-16 DIAGNOSIS — K559 Vascular disorder of intestine, unspecified: Secondary | ICD-10-CM

## 2019-10-16 DIAGNOSIS — Z5181 Encounter for therapeutic drug level monitoring: Secondary | ICD-10-CM

## 2019-10-16 DIAGNOSIS — D6862 Lupus anticoagulant syndrome: Secondary | ICD-10-CM

## 2019-10-16 DIAGNOSIS — Z7901 Long term (current) use of anticoagulants: Secondary | ICD-10-CM

## 2019-10-16 LAB — PROTIME AND INR
BKR INR: 2.61 — ABNORMAL HIGH (ref 0.90–1.10)
BKR PROTHROMBIN TIME: 26.9 s — ABNORMAL HIGH (ref 10.3–11.7)

## 2019-10-17 ENCOUNTER — Telehealth
Admit: 2019-10-17 | Payer: PRIVATE HEALTH INSURANCE | Attending: Pharmacist Clinician (PhC)/ Clinical Pharmacy Specialist | Primary: Internal Medicine

## 2019-10-17 NOTE — Telephone Encounter
Patient called to follow-up on voice message from yesterday; said she's confused about why her PCP told her to not eat vit K foods, and thought they're only related to INR not A1c. She will follow up to clarify with PCP when she gets back from vacation. Please see most recent anticoag encounter note for warfarin dosing details.

## 2019-10-17 NOTE — Progress Notes
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  The patient was contacted regarding lab drawn INR results.  Patient's INR findings and prescribed dose from last visit:Anticoagulation Monitoring Latest Ref Rng & Units 09/22/2019 10/09/2019 10/16/2019 INR 2 - 3 - - - INR 0.91 - 1.10 - - - INR (External) 0.8 - 1.15 - - - Assoc. INR Date - 09/22/2019 10/09/2019 10/16/2019 Associated INR - 3.07 3.30 2.61 Instructions - 7.5 mg every Fri; 5 mg all other days 5 mg every day 5 mg every day Last 7 day dose total - 37.5 mg 37.5 mg 35 mg Next INR Date - 10/02/2019 10/16/2019 11/08/2019 Findings from today's call:NoneLeaving on vacation for 2 weeks from 6/30-7/14. Side effects reported:NoneAssessment/Plan:Patient's INR is 2.61, which is within goal range of 2.0-3.0.  Patient's chart was reviewed and voice message was left instructing patient to call the clinic if there have been any changes to prescription or over the counter medications or diet, if experiencing a current illness, if there are any planned upcoming invasive/dental procedures or any other changes that could affect warfarin therapy.  She was instructed to continue current weekly dose as indicated in the maintenance plan below.  Next INR assessment due 3 weeks - going on vacation for 2 weeks from 6/30-7/14. [name] Plan sent to provider for review. Addendum:Patient says her PCP told her to stay away from spinach/vitamin K foods and she thinks they told her it's related to her A1c. We discussed that vitamin K doesn't affect A1c. Says she needs to discuss diet, says her A1c has been high and she's been eating rice and potatoes; is lactose intolerant. Anticoagulation Episode Summary   Current INR goal:  2.0-3.0 TTR:  47.8 % (5.4 y) Next INR check:  11/08/2019 INR from last check:  2.61 (10/16/2019) Weekly max warfarin dose:   Target end date:  Indefinite INR check location:   Preferred lab:   Send INR reminders to:  [facility] 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  Electronically Signed by [name], PharmD, June 21, 2021[facility]  [address] [place], [place] [address]  [phone]  Fax: [phone]

## 2019-11-13 ENCOUNTER — Telehealth: Admit: 2019-11-13 | Payer: PRIVATE HEALTH INSURANCE | Primary: Internal Medicine

## 2019-11-13 NOTE — Telephone Encounter
PHARMACY NOTE:  ANTICOAG LAB SCHEDULING 	I have called Maria [name] to remind patient of overdue INR. Patient reports she is returning from vacation and will have INR drawn on Wednesday, 11/15/2019.[name], CPHT

## 2019-11-14 ENCOUNTER — Encounter: Admit: 2019-11-14 | Payer: PRIVATE HEALTH INSURANCE | Attending: Surgery of the Hand | Primary: Internal Medicine

## 2019-11-20 ENCOUNTER — Inpatient Hospital Stay: Admit: 2019-11-20 | Discharge: 2019-11-20 | Payer: PRIVATE HEALTH INSURANCE | Primary: Internal Medicine

## 2019-11-20 DIAGNOSIS — Z5181 Encounter for therapeutic drug level monitoring: Secondary | ICD-10-CM

## 2019-11-20 DIAGNOSIS — Z7901 Long term (current) use of anticoagulants: Secondary | ICD-10-CM

## 2019-11-20 DIAGNOSIS — D6862 Lupus anticoagulant syndrome: Secondary | ICD-10-CM

## 2019-11-20 LAB — PROTIME AND INR
BKR INR: 4.58 — ABNORMAL HIGH (ref 0.90–1.10)
BKR PROTHROMBIN TIME: 45.7 seconds — ABNORMAL HIGH (ref 10.3–11.7)

## 2019-11-21 ENCOUNTER — Ambulatory Visit
Admit: 2019-11-21 | Payer: PRIVATE HEALTH INSURANCE | Attending: Pharmacist Clinician (PhC)/ Clinical Pharmacy Specialist | Primary: Internal Medicine

## 2019-11-21 DIAGNOSIS — K559 Vascular disorder of intestine, unspecified: Secondary | ICD-10-CM

## 2019-11-21 DIAGNOSIS — D6862 Lupus anticoagulant syndrome: Secondary | ICD-10-CM

## 2019-11-21 NOTE — Progress Notes
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  The patient was contacted regarding lab drawn INR results.  Patient's INR findings and prescribed dose from last visit:Anticoagulation Monitoring Latest Ref Rng & Units 10/09/2019 10/16/2019 11/21/2019 INR 2 - 3 - - - INR 0.91 - 1.10 - - - INR (External) 0.8 - 1.15 - - - Assoc. INR Date - 10/09/2019 10/16/2019 11/20/2019 Associated INR - 3.30 2.61 4.58 Instructions - 5 mg every day 5 mg every day 7/27: Hold; Otherwise 2.5 mg every Wed; 5 mg all other days Last 7 day dose total - 37.5 mg 35 mg 35 mg Next INR Date - 10/16/2019 11/10/2019 11/28/2019 The following findings were reported from today's call (no findings if none selected):[]  Missed doses []  Extra doses []  Change in medications [x]  Change in vitamin k rich food intake (green vegetables, herbals, etc) [x]  Change in alcohol use []  Change in overall health []  Recent hospitalization / emergency department visit []  Upcoming procedure (including dental procedures) []  Other Comments:       Was on vacation for 3 weeks, wasn't eating her usual vitamin K foods 2x/week (broccoli) and had a wine cooler on Friday on 7/23. Drank aloe juice and has been eating a lot of ginger (increases INR).  The following side effects were reported from today's call (no side effects if none selected):[]  Bleeding [x]  New bruising []  Warfarin-emergency department visit or hospital admission []  Other Comments:   Has a few small bruises from working outside, bruises are healing.  Assessment/Plan:Patient's INR is 4.58, which is above goal range of 2.0-3.0. Had 1 strawberry wine cooler on 7/23. Did drink some aloe juice (can increase INR) 2 weeks ago. Does eat a lot of ginger (can increase INR).  She was instructed to skip her dose today and lower her weekly dose by 7%as indicated in the maintenance plan below.  Next INR assessment due 1 week. 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  Electronically Signed by [name], PharmD, July 27, 2021[facility]  [address] [place], [place] [address]  Phone:[phone]  Fax: [phone]

## 2019-11-22 ENCOUNTER — Telehealth: Admit: 2019-11-22 | Payer: PRIVATE HEALTH INSURANCE | Attending: Medical Oncology | Primary: Internal Medicine

## 2019-11-22 NOTE — Telephone Encounter
Left message for patient to call the office to set up an appointment to see PA Kristen Hoxie within the next few weeks, per Dr. Timothy Lasso

## 2019-11-27 ENCOUNTER — Ambulatory Visit: Admit: 2019-11-27 | Payer: PRIVATE HEALTH INSURANCE | Attending: Pharmacotherapy | Primary: Internal Medicine

## 2019-11-27 ENCOUNTER — Encounter: Admit: 2019-11-27 | Payer: PRIVATE HEALTH INSURANCE | Attending: Pharmacotherapy | Primary: Internal Medicine

## 2019-11-27 ENCOUNTER — Inpatient Hospital Stay: Admit: 2019-11-27 | Discharge: 2019-11-27 | Payer: PRIVATE HEALTH INSURANCE | Primary: Internal Medicine

## 2019-11-27 DIAGNOSIS — K219 Gastro-esophageal reflux disease without esophagitis: Secondary | ICD-10-CM

## 2019-11-27 DIAGNOSIS — R42 Dizziness and giddiness: Secondary | ICD-10-CM

## 2019-11-27 DIAGNOSIS — K436 Other and unspecified ventral hernia with obstruction, without gangrene: Secondary | ICD-10-CM

## 2019-11-27 DIAGNOSIS — E119 Type 2 diabetes mellitus without complications: Secondary | ICD-10-CM

## 2019-11-27 DIAGNOSIS — F32A Depression: Secondary | ICD-10-CM

## 2019-11-27 DIAGNOSIS — L719 Rosacea, unspecified: Secondary | ICD-10-CM

## 2019-11-27 DIAGNOSIS — Z98811 Dental restoration status: Secondary | ICD-10-CM

## 2019-11-27 DIAGNOSIS — Z5181 Encounter for therapeutic drug level monitoring: Secondary | ICD-10-CM

## 2019-11-27 DIAGNOSIS — Z7901 Long term (current) use of anticoagulants: Secondary | ICD-10-CM

## 2019-11-27 DIAGNOSIS — K55039 Acute (reversible) ischemia of large intestine, extent unspecified: Secondary | ICD-10-CM

## 2019-11-27 DIAGNOSIS — D219 Benign neoplasm of connective and other soft tissue, unspecified: Secondary | ICD-10-CM

## 2019-11-27 DIAGNOSIS — D6862 Lupus anticoagulant syndrome: Secondary | ICD-10-CM

## 2019-11-27 DIAGNOSIS — E7800 Pure hypercholesterolemia, unspecified: Secondary | ICD-10-CM

## 2019-11-27 DIAGNOSIS — R112 Nausea with vomiting, unspecified: Secondary | ICD-10-CM

## 2019-11-27 LAB — PROTIME AND INR
BKR INR: 2.19 — ABNORMAL HIGH (ref 0.90–1.10)
BKR PROTHROMBIN TIME: 22.9 seconds — ABNORMAL HIGH (ref 10.3–11.7)

## 2019-11-27 NOTE — Progress Notes
Pharmacist Anticoagulation Clinic Telephone Visit Follow Maria [name] (02-26-1951) is on chronic anticoagulation for the diagnosis of No diagnosis found. with an INR goal of 2.0-3.0.  The patient was contacted regarding lab drawn INR results.  Patient's INR findings and prescribed dose from last visit:Anticoagulation Monitoring Latest Ref Rng & Units 10/16/2019 11/21/2019 11/27/2019 INR 2 - 3 - - - INR 0.91 - 1.10 - - - INR (External) 0.8 - 1.15 - - - Assoc. INR Date - 10/16/2019 11/20/2019 11/27/2019 Associated INR - 2.61 4.58 2.19 Instructions - 5 mg every day 7/27: Hold; Otherwise 2.5 mg every Wed; 5 mg all other days 2.5 mg every Wed; 5 mg all other days Last 7 day dose total - 35 mg 35 mg 27.5 mg Next INR Date - 11/10/2019 11/27/2019 12/11/2019 The following findings were reported from today's call (no findings if none selected):[]  Missed doses []  Extra doses []  Change in medications []  Change in vitamin k rich food intake (green vegetables, herbals, etc) []  Change in alcohol use []  Change in overall health []  Recent hospitalization / emergency department visit []  Upcoming procedure (including dental procedures) []  Other Comments:       XNo findings The following side effects were reported from today's call (no side effects if none selected):[]  Bleeding []  New bruising []  Warfarin-emergency department visit or hospital admission []  Other Comments:   XNo side effects Assessment/Plan:Patient's INR is 2.19, which is within goal range of 2.0-3.0.  Patient states that she back to eating a normal diet.  She was instructed to continue current weekly dose as indicated in the maintenance plan below.  Next INR assessment due 2 weeks.  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  Electronically Signed by Markus Daft, RPH, August 2, 2021[facility]  [address], [place], [place] [address]  [phone]  Fax: [phone]

## 2019-11-29 ENCOUNTER — Encounter: Admit: 2019-11-29 | Payer: PRIVATE HEALTH INSURANCE | Attending: Surgery of the Hand | Primary: Internal Medicine

## 2019-11-29 ENCOUNTER — Ambulatory Visit: Admit: 2019-11-29 | Payer: PRIVATE HEALTH INSURANCE | Attending: Surgery of the Hand | Primary: Internal Medicine

## 2019-11-29 DIAGNOSIS — K219 Gastro-esophageal reflux disease without esophagitis: Secondary | ICD-10-CM

## 2019-11-29 DIAGNOSIS — E119 Type 2 diabetes mellitus without complications: Secondary | ICD-10-CM

## 2019-11-29 DIAGNOSIS — E7800 Pure hypercholesterolemia, unspecified: Secondary | ICD-10-CM

## 2019-11-29 DIAGNOSIS — F32A Depression: Secondary | ICD-10-CM

## 2019-11-29 DIAGNOSIS — R112 Nausea with vomiting, unspecified: Secondary | ICD-10-CM

## 2019-11-29 DIAGNOSIS — Z98811 Dental restoration status: Secondary | ICD-10-CM

## 2019-11-29 DIAGNOSIS — K432 Incisional hernia without obstruction or gangrene: Secondary | ICD-10-CM

## 2019-11-29 DIAGNOSIS — L719 Rosacea, unspecified: Secondary | ICD-10-CM

## 2019-11-29 DIAGNOSIS — D219 Benign neoplasm of connective and other soft tissue, unspecified: Secondary | ICD-10-CM

## 2019-11-29 DIAGNOSIS — K436 Other and unspecified ventral hernia with obstruction, without gangrene: Secondary | ICD-10-CM

## 2019-11-29 DIAGNOSIS — K55039 Acute (reversible) ischemia of large intestine, extent unspecified: Secondary | ICD-10-CM

## 2019-11-29 DIAGNOSIS — R42 Dizziness and giddiness: Secondary | ICD-10-CM

## 2019-11-29 MED ORDER — DHEA 10 MG-47 MG CALCIUM TABLET
10 mg-47 mg calcium | ORAL | Status: AC
Start: 2019-11-29 — End: 2021-10-22

## 2019-11-29 NOTE — H&P
NU:[id number]:IHKVQ Maria [name] is a  69 y.o. female complaining of abdomen  pain and discomfort. The symptoms are described as moderate, and have been present for 2 years. The injury is not work related. She has issues when she eats. She says her clothes hardly fit her now. She noticed the hernia came back a year ago. Dr. Hildred Alamin performed a hernia repair December 05, 2015.Cause: herniaAssociated symptoms: noneTreatments tried: surgery, ventral hernia repair Review of Systems Constitutional: Negative.  Negative for chills and fever. Musculoskeletal:      + abdominal pain Skin: Negative.  Negative for itching and rash. All other systems reviewed and are negative.Occupational History ? Not on file Allergies:She is allergic to morphine; biaxin  [clarithromycin]; and latex, natural rubber.Medications:She has a current medication list which includes the following prescription(s): acetaminophen (TYLENOL) 500 mg tablet - Take 1 tablet (500 mg total) by mouth every 4 (four) hours as needed, ascorbic acid/collagen hydr (COLLAGEN PLUS VITAMIN C ORAL) - Take 1 tablet by mouth every other day., BIOTIN ORAL - Take 1 tablet by mouth every other day, cholecalciferol, vitamin D3, (VITAMIN D3) 5,000 unit Tab - Take 1 tablet by mouth every other day., empagliflozin (JARDIANCE) 10 mg tablet - Take 10 mg by mouth daily, gabapentin (NEURONTIN) 100 mg capsule - Take 100 mg by mouth at bedtime, meclizine (ANTIVERT) 12.5 mg tablet - Take 1 tablet (12.5 mg total) by mouth daily as needed for Dizziness, melatonin 10 mg Tab - Take 10 mg by mouth nightly as needed, multivitamin tablet - Take 1 tablet by mouth daily, ondansetron (ZOFRAN) 4 MG tablet - Take 4 mg by mouth every 8 (eight) hours as needed for Nausea, phytonadione, vit K1, (VITAMIN K) 100 mcg tablet - Take 100 mcg by mouth daily, polyethylene glycol (MIRALAX) 17 gram packet - Take 1 packet (17 g total) by mouth daily. Mix in 8 ounces of water, juice, soda, coffee or tea prior to taking, prasterone, dhea,-calcium carb (DHEA) 10 mg-47 mg calcium Tab - Take by mouth, pravastatin (PRAVACHOL) 40 MG tablet - Take 40 mg by mouth nightly, senna (SENOKOT) 8.6 mg tablet - Take 2 tablets by mouth nightly., sitaGLIPtin (JANUVIA) 100 MG tablet - Take 100 mg by mouth daily., sodium chloride (SODIUM CHLORIDE) 0.65 % nasal spray - 1 spray by Each Nare route as needed for Congestion., venlafaxine (EFFEXOR XR) 150 mg XR 24 hr extended release capsule - Take 150 mg by mouth daily, warfarin (COUMADIN) 5 mg tablet - TAKE 1.5 TABLETS (7.5MG ) ON MONDAY AND FRIDAY. TAKE 1 TAB (5 MG) ALL OTHER DAYS (TUES,WEDS,THURS,SAT,SUN), ascorbic acid (VITAMIN C ORAL) - Take 1 tablet by mouth daily, olopatadine (PATADAY) 0.2 % Drop - Place 1 drop into both eyes daily (Patient not taking: Reported on 11/29/2019), and ZINC ORAL - Take by mouth daily.Medical History:She  has a past medical history of Acute ischemic colitis (HC Code) (2015), Dental crown present, Depression, Diabetes mellitus (HC Code), Fibroid, GERD (gastroesophageal reflux disease), Hypercholesteremia, PONV (postoperative nausea and vomiting), Rosacea, Ventral hernia with bowel obstruction, and Vertigo.Surgical History: She  has a past surgical history that includes Knee ligament reconstruction (Right, 2004); Laparoscopic Nissen fundoplication; Umbilical hernia repair; Ventral hernia repair; Tubal ligation; THYROID BIOPSY; 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 Social History:She  reports that she has never smoked. She has never used smokeless tobacco. She reports current alcohol use. She reports that she does not use drugs.Family History:Her family history includes Bladder cancer in her mother; Breast  cancer in her cousin; Cancer in her mother and sister; Colon cancer in her cousin, father, and paternal aunt. Exam: Constitutional: Maria [name] is in no distress.Skin: Warm and dry.Neurologic: No sensory or motor abnormalities. Neck: normal Lungs: clear bilaterallyHeart:HS normalAbd:  incarcerated hernia in right stoma area and infra umbilicus areaImaging:[facility] 12/11/2020IMPRESSION:?1.  Recurrent proximal small bowel obstruction with transition point in the mid anterior abdomen, at the level where small bowel appears to be tethered to the anterior abdominal wall. No evidence of pneumatosis or free air.2.  Multiple ventral hernias and postsurgical changes, as described above.Data Reviewed:previous medical recordsDiagnosis:1. Ventral hernia, recurrent    We are going to do a ventral recurrent hernia repair coordinating with Dr. [name]. She would like it done in the fall.The risks and benefits of surgery were discussed with the patient. The risks include infection, bleeding, nerve injury, numbness, pain, abnormal scarring and recurrence. The patient signed the consent form. My office will contact the patient to schedule the surgery.Follow-Up: Patient will follow up with me in OR.Scribed for Iverson Alamin, MD by Particia Lather, medical scribe November 29, 2019 The documentation recorded by the scribe accurately reflects the services I personally performed and the decisions made by me. I reviewed and confirmed all material entered and/or pre-charted by the scribe.

## 2019-12-15 ENCOUNTER — Telehealth: Admit: 2019-12-15 | Payer: PRIVATE HEALTH INSURANCE | Primary: Internal Medicine

## 2019-12-15 ENCOUNTER — Encounter: Admit: 2019-12-15 | Payer: PRIVATE HEALTH INSURANCE | Attending: Internal Medicine | Primary: Internal Medicine

## 2019-12-15 DIAGNOSIS — Z1231 Encounter for screening mammogram for malignant neoplasm of breast: Secondary | ICD-10-CM

## 2019-12-15 NOTE — Telephone Encounter
PHARMACY NOTE:  ANTICOAG LAB SCHEDULING 	I have called Maria [name] to remind patient of overdue INR. Patient reports she will get INR drawn on Monday.[name], CPHT

## 2019-12-18 ENCOUNTER — Inpatient Hospital Stay: Admit: 2019-12-18 | Discharge: 2019-12-18 | Payer: PRIVATE HEALTH INSURANCE | Primary: Internal Medicine

## 2019-12-18 DIAGNOSIS — Z7901 Long term (current) use of anticoagulants: Secondary | ICD-10-CM

## 2019-12-18 DIAGNOSIS — D6862 Lupus anticoagulant syndrome: Secondary | ICD-10-CM

## 2019-12-18 DIAGNOSIS — Z5181 Encounter for therapeutic drug level monitoring: Secondary | ICD-10-CM

## 2019-12-18 LAB — PROTIME AND INR
BKR INR: 4.19 — ABNORMAL HIGH (ref 0.88–1.15)
BKR PROTHROMBIN TIME: 39.7 seconds — ABNORMAL HIGH (ref 9.6–12.3)

## 2019-12-19 ENCOUNTER — Ambulatory Visit
Admit: 2019-12-19 | Payer: PRIVATE HEALTH INSURANCE | Attending: Pharmacist Clinician (PhC)/ Clinical Pharmacy Specialist | Primary: Internal Medicine

## 2019-12-19 DIAGNOSIS — K559 Vascular disorder of intestine, unspecified: Secondary | ICD-10-CM

## 2019-12-19 DIAGNOSIS — D6862 Lupus anticoagulant syndrome: Secondary | ICD-10-CM

## 2019-12-21 ENCOUNTER — Telehealth: Admit: 2019-12-21 | Payer: PRIVATE HEALTH INSURANCE | Attending: Medical | Primary: Internal Medicine

## 2019-12-21 NOTE — Telephone Encounter
Patient is calling to make Nadya aware that  VIT K2  Was delivered, she does not need a return call

## 2019-12-26 NOTE — Progress Notes
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  The patient was contacted regarding lab drawn INR results.  Patient's INR findings and prescribed dose from last visit:Anticoagulation Monitoring Latest Ref Rng & Units 11/21/2019 11/27/2019 12/19/2019 INR 2.0 - 3.0 - - - INR 0.91 - 1.10 - - - INR (External) 0.80 - 1.15 - - - Assoc. INR Date - 11/20/2019 11/27/2019 12/18/2019 Associated INR - 4.58 2.19 4.19 Instructions - 7/27: Hold; Otherwise 2.5 mg every Wed; 5 mg all other days 2.5 mg every Wed; 5 mg all other days 8/24: Hold; Otherwise 2.5 mg every Wed, Fri; 5 mg all other days Last 7 day dose total - 35 mg 27.5 mg 32.5 mg Next INR Date - 11/27/2019 12/11/2019 12/25/2019 The following findings were reported from today's call (no findings if none selected):[]  Missed doses []  Extra doses []  Change in medications []  Change in vitamin k rich food intake (green vegetables, herbals, etc) []  Change in alcohol use []  Change in overall health []  Recent hospitalization / emergency department visit []  Upcoming procedure (including dental procedures) []  Other Comments:       No findings The following side effects were reported from today's call (no side effects if none selected):[]  Bleeding []  New bruising []  Warfarin-emergency department visit or hospital admission []  Other Comments:   No side effects Assessment/Plan:Patient's INR is 4.19, which is above goal range of 2.0-3.0.  Patient ran out of vitamin K about 2 weeks ago while on vacation, reports not eating her usual vegetable mix with broccoli over the last week. She was instructed to skip today's dose and 2.5 mg on Wed. She will have the vitamin K supply on Thursday, so she was instructed to resume 5 mg all other days as indicated in the maintenance plan below.  Next INR assessment due 1 week. This information was routed to the covering provider for review. She will call us if she doesn't get vit K supply. Addendum: patient called to notify clinic she resumed vit K supplement on 12/21/19. 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  Electronically Signed by [name], PharmD, August 24, 2021[facility]  [address], [place], [place] [address]  Phone:[phone]  Fax: [phone]

## 2019-12-27 ENCOUNTER — Inpatient Hospital Stay: Admit: 2019-12-27 | Discharge: 2019-12-27 | Payer: PRIVATE HEALTH INSURANCE | Primary: Internal Medicine

## 2019-12-27 ENCOUNTER — Ambulatory Visit
Admit: 2019-12-27 | Payer: PRIVATE HEALTH INSURANCE | Attending: Pharmacist Clinician (PhC)/ Clinical Pharmacy Specialist | Primary: Internal Medicine

## 2019-12-27 DIAGNOSIS — D6862 Lupus anticoagulant syndrome: Secondary | ICD-10-CM

## 2019-12-27 DIAGNOSIS — Z5181 Encounter for therapeutic drug level monitoring: Secondary | ICD-10-CM

## 2019-12-27 DIAGNOSIS — K559 Vascular disorder of intestine, unspecified: Secondary | ICD-10-CM

## 2019-12-27 DIAGNOSIS — Z7901 Long term (current) use of anticoagulants: Secondary | ICD-10-CM

## 2019-12-27 LAB — PROTIME AND INR
BKR INR: 3.3 — ABNORMAL HIGH (ref 0.90–1.10)
BKR PROTHROMBIN TIME: 33.6 seconds — ABNORMAL HIGH (ref 10.3–11.7)

## 2019-12-27 NOTE — Progress Notes
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  The patient was contacted regarding lab drawn INR results.  Patient's INR findings and prescribed dose from last visit:Anticoagulation Monitoring Latest Ref Rng & Units 11/27/2019 12/19/2019 12/27/2019 INR 2.0 - 3.0 - - - INR 0.91 - 1.10 - - - INR (External) 0.80 - 1.15 - - - Assoc. INR Date - 11/27/2019 12/18/2019 12/27/2019 Associated INR - 2.19 4.19 3.30 Instructions - 2.5 mg every Wed; 5 mg all other days 8/24: Hold; Otherwise 2.5 mg every Wed; 5 mg all other days 2.5 mg every Wed; 5 mg all other days Last 7 day dose total - 27.5 mg 32.5 mg 32.5 mg Next INR Date - 12/11/2019 12/27/2019 - The following findings were reported from today's call (no findings if none selected):[]  Missed doses []  Extra doses []  Change in medications []  Change in vitamin k rich food intake (green vegetables, herbals, etc) []  Change in alcohol use []  Change in overall health []  Recent hospitalization / emergency department visit []  Upcoming procedure (including dental procedures) []  Other Comments:       Patient resumed low dose vit K 100 mcg daily on 12/21/19  The following side effects were reported from today's call (no side effects if none selected):[]  Bleeding []  New bruising []  Warfarin-emergency department visit or hospital admission []  Other Comments:   No side effects Assessment/Plan:Patient's INR is 3.30, which is above goal range of 2.0-3.0.  Started taking vitamin K 100 mcg daily on 8/26.  She was instructed to continue current weekly dose as indicated in the maintenance plan below.  Next INR assessment due 1 week.  Has appt at the office on 9/15. 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  Electronically Signed by [name], PharmD, September 1, 2021[facility]  [address] [place], [place] [address]  Phone:[phone]  Fax: [phone]

## 2019-12-28 ENCOUNTER — Telehealth: Admit: 2019-12-28 | Payer: PRIVATE HEALTH INSURANCE | Attending: Medical | Primary: Internal Medicine

## 2019-12-28 NOTE — Telephone Encounter
Called pt and left message to call the [facility] to confirm her time change for Wednesday 9/15.  [facility] number was provider as well

## 2020-01-03 ENCOUNTER — Inpatient Hospital Stay: Admit: 2020-01-03 | Discharge: 2020-01-03 | Payer: PRIVATE HEALTH INSURANCE

## 2020-01-03 ENCOUNTER — Emergency Department: Admit: 2020-01-03 | Payer: PRIVATE HEALTH INSURANCE | Primary: Internal Medicine

## 2020-01-03 ENCOUNTER — Encounter: Admit: 2020-01-03 | Payer: PRIVATE HEALTH INSURANCE | Attending: Surgery of the Hand | Primary: Internal Medicine

## 2020-01-03 ENCOUNTER — Encounter: Admit: 2020-01-03 | Payer: PRIVATE HEALTH INSURANCE | Attending: Medical | Primary: Internal Medicine

## 2020-01-03 DIAGNOSIS — R112 Nausea with vomiting, unspecified: Secondary | ICD-10-CM

## 2020-01-03 DIAGNOSIS — F32A Depression: Secondary | ICD-10-CM

## 2020-01-03 DIAGNOSIS — K55039 Acute (reversible) ischemia of large intestine, extent unspecified: Secondary | ICD-10-CM

## 2020-01-03 DIAGNOSIS — Z98811 Dental restoration status: Secondary | ICD-10-CM

## 2020-01-03 DIAGNOSIS — K219 Gastro-esophageal reflux disease without esophagitis: Secondary | ICD-10-CM

## 2020-01-03 DIAGNOSIS — L719 Rosacea, unspecified: Secondary | ICD-10-CM

## 2020-01-03 DIAGNOSIS — D219 Benign neoplasm of connective and other soft tissue, unspecified: Secondary | ICD-10-CM

## 2020-01-03 DIAGNOSIS — E7800 Pure hypercholesterolemia, unspecified: Secondary | ICD-10-CM

## 2020-01-03 DIAGNOSIS — R109 Unspecified abdominal pain: Secondary | ICD-10-CM

## 2020-01-03 DIAGNOSIS — K436 Other and unspecified ventral hernia with obstruction, without gangrene: Secondary | ICD-10-CM

## 2020-01-03 DIAGNOSIS — R42 Dizziness and giddiness: Secondary | ICD-10-CM

## 2020-01-03 DIAGNOSIS — E119 Type 2 diabetes mellitus without complications: Secondary | ICD-10-CM

## 2020-01-03 LAB — CBC WITH AUTO DIFFERENTIAL
BKR BLOOD, UA: 2.6 % — ABNORMAL LOW (ref 8.0–49.0)
BKR EGFR (AFR AMER): 11.6 fL — ABNORMAL HIGH (ref 6.0–11.0)
BKR WAM ABSOLUTE IMMATURE GRANULOCYTES: 0 x 1000/??L (ref 0.0–0.3)
BKR WAM ABSOLUTE LYMPHOCYTE COUNT: 0.3 x 1000/??L — ABNORMAL LOW (ref 1.0–4.0)
BKR WAM ABSOLUTE NRBC: 0 x 1000/??L (ref 0.0–0.0)
BKR WAM ANALYZER ANC: 11.3 x 1000/ÂµL — ABNORMAL HIGH (ref 1.0–11.0)
BKR WAM BASOPHIL ABSOLUTE COUNT: 0 x 1000/??L (ref 0.0–0.0)
BKR WAM BASOPHILS: 0.3 % (ref 0.0–4.0)
BKR WAM EOSINOPHIL ABSOLUTE COUNT: 0 x 1000/??L (ref 0.0–1.0)
BKR WAM EOSINOPHILS: 0.2 % (ref 0.0–7.0)
BKR WAM HEMATOCRIT: 46.4 % — ABNORMAL HIGH (ref 37.0–52.0)
BKR WAM HEMOGLOBIN: 14.9 g/dL (ref 12.0–18.0)
BKR WAM IMMATURE GRANULOCYTES: 0.3 % (ref 0.0–3.0)
BKR WAM LYMPHOCYTES: 2.6 % — ABNORMAL LOW (ref 8.0–49.0)
BKR WAM MCH (PG): 25.9 pg — ABNORMAL LOW (ref 27.0–31.0)
BKR WAM MCHC: 32.1 g/dL (ref 31.0–36.0)
BKR WAM MCV: 80.7 fL (ref 78.0–94.0)
BKR WAM MONOCYTE ABSOLUTE COUNT: 0.4 x 1000/ÂµL (ref 0.0–2.0)
BKR WAM MPV: 11.6 fL — ABNORMAL HIGH (ref 6.0–11.0)
BKR WAM NEUTROPHILS: 93.7 % — ABNORMAL HIGH (ref 37.0–84.0)
BKR WAM NUCLEATED RED BLOOD CELLS: 0 % (ref 0.0–1.0)
BKR WAM PLATELETS: 379 x1000/ÂµL (ref 140–440)
BKR WAM RDW-CV: 16 % — ABNORMAL HIGH (ref 11.5–14.5)
BKR WAM RED BLOOD CELL COUNT: 5.8 M/ÂµL (ref 3.8–5.9)
BKR WAM WHITE BLOOD CELL COUNT: 12 x1000/ÂµL — ABNORMAL HIGH (ref 4.0–10.0)

## 2020-01-03 LAB — BASIC METABOLIC PANEL
BKR ANION GAP: 15 (ref 7–17)
BKR BLOOD UREA NITROGEN: 18 mg/dL (ref 8–23)
BKR BUN / CREAT RATIO: 25.7 x1000/??L — ABNORMAL HIGH (ref 8.0–23.0)
BKR CALCIUM: 8.7 mg/dL — ABNORMAL LOW (ref 8.8–10.2)
BKR CHLORIDE: 98 mmol/L (ref 98–107)
BKR CO2: 21 mmol/L (ref 20–30)
BKR CREATININE: 0.7 mg/dL (ref 0.40–1.30)
BKR EGFR (NON AFRICAN AMERICAN): >60 mL/min/{1.73_m2} (ref >60)
BKR GLUCOSE: 184 mg/dL — ABNORMAL HIGH (ref 70–100)
BKR POTASSIUM: 4.4 mmol/L (ref 3.3–5.1)
BKR SODIUM: 134 mmol/L — ABNORMAL LOW (ref 136–144)

## 2020-01-03 LAB — URINALYSIS WITH CULTURE REFLEX      (BH LMW YH)
BKR BILIRUBIN, UA: NEGATIVE
BKR LEUKOCYTE ESTERASE, UA: NEGATIVE
BKR NITRITE, UA: NEGATIVE x 1000/??L (ref 0.0–2.0)
BKR PH, UA: 6 (ref 5.5–7.5)
BKR SPECIFIC GRAVITY, UA: 1.031 — ABNORMAL HIGH (ref 1.005–1.030)
BKR UROBILINOGEN, UA: <2 EU/dL (ref <=2.0)
BKR WAM MONOCYTES: NEGATIVE % — ABNORMAL LOW (ref 4.0–15.0)

## 2020-01-03 LAB — UA REFLEX CULTURE

## 2020-01-03 LAB — HEPATIC FUNCTION PANEL
BKR A/G RATIO: 1.1 (ref 1.0–2.2)
BKR ALANINE AMINOTRANSFERASE (ALT): 24 U/L (ref 10–35)
BKR ALBUMIN: 4.1 g/dL (ref 3.6–4.9)
BKR ALKALINE PHOSPHATASE: 121 U/L (ref 9–122)
BKR ASPARTATE AMINOTRANSFERASE (AST): 32 U/L (ref 10–35)
BKR AST/ALT RATIO: 1.3 mmol/L (ref 3.3–5.1)
BKR BILIRUBIN DIRECT: <0.2 mg/dL (ref <=0.3)
BKR BILIRUBIN TOTAL: 0.7 mg/dL (ref <=1.2)
BKR GLOBULIN: 3.7 g/dL — ABNORMAL HIGH (ref 2.3–3.5)
BKR PROTEIN TOTAL: 7.8 g/dL — ABNORMAL HIGH (ref 6.6–8.7)

## 2020-01-03 LAB — LIPASE: BKR LIPASE: 16 U/L (ref 11–55)

## 2020-01-03 MED ORDER — CALCIUM CARBONATE 200 MG CALCIUM (500 MG) CHEWABLE TABLET
500200 mg (200 mg calcium) | Freq: Once | ORAL | Status: DC
Start: 2020-01-03 — End: 2020-01-04

## 2020-01-03 MED ORDER — SODIUM CHLORIDE 0.9 % BOLUS (NEW BAG)
0.9 % | Freq: Once | INTRAVENOUS | Status: CP
Start: 2020-01-03 — End: ?
  Administered 2020-01-03: 18:00:00 0.9 mL/h via INTRAVENOUS

## 2020-01-03 MED ORDER — DIPHENHYDRAMINE 50 MG/ML INJECTION SOLUTION
50 mg/mL | Freq: Once | INTRAVENOUS | Status: CP
Start: 2020-01-03 — End: ?
  Administered 2020-01-03: 23:00:00 50 mL via INTRAVENOUS

## 2020-01-03 MED ORDER — IOHEXOL 350 MG IODINE/ML INTRAVENOUS SOLUTION
350 mg iodine/mL | Freq: Once | INTRAVENOUS | Status: CP | PRN
Start: 2020-01-03 — End: ?
  Administered 2020-01-03: 23:00:00 350 mL via INTRAVENOUS

## 2020-01-03 MED ORDER — DIPHENHYDRAMINE 50 MG/ML INJECTION SOLUTION
50 mg/mL | Freq: Once | INTRAVENOUS | Status: CP
Start: 2020-01-03 — End: ?
  Administered 2020-01-03: 18:00:00 50 mL via INTRAVENOUS

## 2020-01-03 MED ORDER — IOHEXOL (OMNIPAQUE) 350 MG/ML ORAL SOLUTION 25 ML IN WATER 900 ML
Freq: Once | ORAL | Status: CP
Start: 2020-01-03 — End: ?
  Administered 2020-01-03: 19:00:00 900.000 mL via ORAL

## 2020-01-03 MED ORDER — MORPHINE 4 MG/ML INTRAVENOUS SOLUTION
4 mg/mL | Freq: Once | INTRAVENOUS | Status: CP
Start: 2020-01-03 — End: ?
  Administered 2020-01-03: 18:00:00 4 mL via INTRAVENOUS

## 2020-01-03 MED ORDER — MORPHINE 4 MG/ML INTRAVENOUS SOLUTION
4 mg/mL | Freq: Once | INTRAVENOUS | Status: CP
Start: 2020-01-03 — End: ?
  Administered 2020-01-03: 23:00:00 4 mL via INTRAVENOUS

## 2020-01-03 MED ORDER — LACTATED RINGERS IV BOLUS (NEW BAG)
Freq: Once | INTRAVENOUS | Status: CP
Start: 2020-01-03 — End: ?
  Administered 2020-01-03: 22:00:00 1000.000 mL/h via INTRAVENOUS

## 2020-01-03 MED ORDER — SODIUM CHLORIDE 0.9 % BOLUS (NEW BAG)
0.9 % | Freq: Once | INTRAVENOUS | Status: CP | PRN
Start: 2020-01-03 — End: ?
  Administered 2020-01-03: 23:00:00 0.9 mL/h via INTRAVENOUS

## 2020-01-03 NOTE — ED Notes
7:49 PM Pt given sandwich and water per PA request. Seeing if pt tolerates food and drink.

## 2020-01-04 DIAGNOSIS — Z9851 Tubal ligation status: Secondary | ICD-10-CM

## 2020-01-04 DIAGNOSIS — K219 Gastro-esophageal reflux disease without esophagitis: Secondary | ICD-10-CM

## 2020-01-04 DIAGNOSIS — Z91040 Latex allergy status: Secondary | ICD-10-CM

## 2020-01-04 DIAGNOSIS — Z885 Allergy status to narcotic agent status: Secondary | ICD-10-CM

## 2020-01-04 DIAGNOSIS — E7800 Pure hypercholesterolemia, unspecified: Secondary | ICD-10-CM

## 2020-01-04 DIAGNOSIS — E119 Type 2 diabetes mellitus without complications: Secondary | ICD-10-CM

## 2020-01-04 DIAGNOSIS — K439 Ventral hernia without obstruction or gangrene: Secondary | ICD-10-CM

## 2020-01-04 DIAGNOSIS — Z7901 Long term (current) use of anticoagulants: Secondary | ICD-10-CM

## 2020-01-04 DIAGNOSIS — Z881 Allergy status to other antibiotic agents status: Secondary | ICD-10-CM

## 2020-01-04 DIAGNOSIS — Z79899 Other long term (current) drug therapy: Secondary | ICD-10-CM

## 2020-01-04 NOTE — ED Notes
1:15 PM Pt ambulatory from triage reporting abd pain, +NV. Pt A+Ox4 speaking in full clear sentences has hx of multiple SBOs and hernias requiring surgery. Last SBO was about 10 months ago. Hx of colostomy with reversal in 2015 and states everything got worse after that. Pt arrives with abd distended and painful- reports eating dinner around 6pm last night and then began with NV last night into this morning. BMs are mixed between diarrhea and solid, per pt- denies blood. Denies urinary sx.  1:52 PM MD Agrwal at bedside for eval. IV placed and labs sent. Pt NAD at this time. 3:34 PM Ambulatory to restroom to provide urine sample, NAD. Began drinking oral contrast.3:37 PM Pt completed oral contrast, [place] called and made aware. NAD at this time- resting on stretcher. 5:45 PM Pt to [place] scan, LR started. Pt NAD at this time.6:14 PM Pt returned from [place] scan, NAD at this time.6:50 PM Pt c/o pain, medicated per orders. Resting on stretcher NAD at this time.8:27 PM provider at bedside for discharge, VSS and IV removed. Pt NAD at this time.

## 2020-01-04 NOTE — Discharge Instructions
Please follow up on Monday with Dr. [name]. You can take medication like Tums, pepcid, and tylenol at home to help with your abdominal pain. Drink plenty of water and eat food as tolerated with this abdominal pain. Seek care if the pain is getting worse, if you are unable to keep down any fluids, or other concerning symptoms.

## 2020-01-05 ENCOUNTER — Ambulatory Visit: Admit: 2020-01-05 | Payer: PRIVATE HEALTH INSURANCE | Attending: Internal Medicine | Primary: Internal Medicine

## 2020-01-05 DIAGNOSIS — Z20822 Contact with and (suspected) exposure to covid-19: Secondary | ICD-10-CM

## 2020-01-09 ENCOUNTER — Encounter: Admit: 2020-01-09 | Payer: PRIVATE HEALTH INSURANCE | Attending: Medical | Primary: Internal Medicine

## 2020-01-09 DIAGNOSIS — D6862 Lupus anticoagulant syndrome: Secondary | ICD-10-CM

## 2020-01-10 ENCOUNTER — Ambulatory Visit: Admit: 2020-01-10 | Payer: PRIVATE HEALTH INSURANCE | Primary: Internal Medicine

## 2020-01-10 ENCOUNTER — Ambulatory Visit: Admit: 2020-01-10 | Payer: PRIVATE HEALTH INSURANCE | Attending: Medical | Primary: Internal Medicine

## 2020-01-15 ENCOUNTER — Ambulatory Visit: Admit: 2020-01-15 | Payer: PRIVATE HEALTH INSURANCE | Attending: Medical Oncology | Primary: Internal Medicine

## 2020-01-15 ENCOUNTER — Inpatient Hospital Stay: Admit: 2020-01-15 | Discharge: 2020-01-15 | Payer: PRIVATE HEALTH INSURANCE | Primary: Internal Medicine

## 2020-01-15 ENCOUNTER — Encounter: Admit: 2020-01-15 | Payer: PRIVATE HEALTH INSURANCE | Attending: Medical Oncology | Primary: Internal Medicine

## 2020-01-15 DIAGNOSIS — D6862 Lupus anticoagulant syndrome: Secondary | ICD-10-CM

## 2020-01-15 DIAGNOSIS — Z5181 Encounter for therapeutic drug level monitoring: Secondary | ICD-10-CM

## 2020-01-15 DIAGNOSIS — Z7901 Long term (current) use of anticoagulants: Secondary | ICD-10-CM

## 2020-01-15 LAB — PROTIME AND INR
BKR INR: 3.18 — ABNORMAL HIGH (ref 0.91–1.05)
BKR PROTHROMBIN TIME: 31.9 seconds — ABNORMAL HIGH (ref 10.0–11.6)

## 2020-01-16 ENCOUNTER — Ambulatory Visit: Admit: 2020-01-16 | Payer: PRIVATE HEALTH INSURANCE | Attending: Pharmacotherapy | Primary: Internal Medicine

## 2020-01-16 ENCOUNTER — Encounter: Admit: 2020-01-16 | Payer: PRIVATE HEALTH INSURANCE | Attending: Pharmacotherapy | Primary: Internal Medicine

## 2020-01-16 DIAGNOSIS — R112 Nausea with vomiting, unspecified: Secondary | ICD-10-CM

## 2020-01-16 DIAGNOSIS — D6862 Lupus anticoagulant syndrome: Secondary | ICD-10-CM

## 2020-01-16 DIAGNOSIS — K219 Gastro-esophageal reflux disease without esophagitis: Secondary | ICD-10-CM

## 2020-01-16 DIAGNOSIS — K55039 Acute (reversible) ischemia of large intestine, extent unspecified: Secondary | ICD-10-CM

## 2020-01-16 DIAGNOSIS — Z98811 Dental restoration status: Secondary | ICD-10-CM

## 2020-01-16 DIAGNOSIS — F32A Depression: Secondary | ICD-10-CM

## 2020-01-16 DIAGNOSIS — E119 Type 2 diabetes mellitus without complications: Secondary | ICD-10-CM

## 2020-01-16 DIAGNOSIS — E7800 Pure hypercholesterolemia, unspecified: Secondary | ICD-10-CM

## 2020-01-16 DIAGNOSIS — L719 Rosacea, unspecified: Secondary | ICD-10-CM

## 2020-01-16 DIAGNOSIS — K559 Vascular disorder of intestine, unspecified: Secondary | ICD-10-CM

## 2020-01-16 DIAGNOSIS — R42 Dizziness and giddiness: Secondary | ICD-10-CM

## 2020-01-16 DIAGNOSIS — D219 Benign neoplasm of connective and other soft tissue, unspecified: Secondary | ICD-10-CM

## 2020-01-16 DIAGNOSIS — K436 Other and unspecified ventral hernia with obstruction, without gangrene: Secondary | ICD-10-CM

## 2020-01-16 NOTE — Progress Notes
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  The patient was contacted regarding lab drawn INR results.  Patient's INR findings and prescribed dose from last visit:Anticoagulation Monitoring Latest Ref Rng & Units 12/19/2019 12/27/2019 01/16/2020 INR 2.0 - 3.0 - - - INR 0.91 - 1.10 - - - INR (External) 0.80 - 1.15 - - - Assoc. INR Date - 12/18/2019 12/27/2019 01/15/2020 Associated INR - 4.19 3.30 3.18 Instructions - 8/24: Hold; Otherwise 2.5 mg every Wed; 5 mg all other days 2.5 mg every Wed; 5 mg all other days 2.5 mg every Wed; 5 mg all other days Last 7 day dose total - 32.5 mg 32.5 mg 32.5 mg Next INR Date - 12/27/2019 01/03/2020 01/23/2020 The following findings were reported from today's call (no findings if none selected):[]  Missed doses []  Extra doses []  Change in medications []  Change in vitamin k rich food intake (green vegetables, herbals, etc) []  Change in alcohol use []  Change in overall health []  Recent hospitalization / emergency department visit []  Upcoming procedure (including dental procedures) []  Other Comments:       No findings The following side effects were reported from today's call (no side effects if none selected):[]  Bleeding []  New bruising []  Warfarin-emergency department visit or hospital admission []  Other Comments:   No side effects Assessment/Plan:Patient's INR is 3.18, which is above goal range of 2.0-3.0.  She was instructed to continue current weekly dose as indicated in the maintenance plan below.  Next INR assessment due 1 week.  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  Electronically Signed by Markus Daft, RPH, September 21, 2021[facility]  [address], [place], [place] [address]  [phone]  Fax: [phone]

## 2020-01-29 ENCOUNTER — Inpatient Hospital Stay: Admit: 2020-01-29 | Discharge: 2020-01-29 | Payer: PRIVATE HEALTH INSURANCE | Primary: Internal Medicine

## 2020-01-29 DIAGNOSIS — D6862 Lupus anticoagulant syndrome: Secondary | ICD-10-CM

## 2020-01-29 DIAGNOSIS — Z7901 Long term (current) use of anticoagulants: Secondary | ICD-10-CM

## 2020-01-29 DIAGNOSIS — Z5181 Encounter for therapeutic drug level monitoring: Secondary | ICD-10-CM

## 2020-01-29 LAB — PROTIME AND INR: BKR PROTHROMBIN TIME: 31.1 seconds — ABNORMAL HIGH (ref 10.0–11.6)

## 2020-01-30 ENCOUNTER — Ambulatory Visit
Admit: 2020-01-30 | Payer: PRIVATE HEALTH INSURANCE | Attending: Pharmacist Clinician (PhC)/ Clinical Pharmacy Specialist | Primary: Internal Medicine

## 2020-01-30 DIAGNOSIS — K559 Vascular disorder of intestine, unspecified: Secondary | ICD-10-CM

## 2020-01-30 DIAGNOSIS — D6862 Lupus anticoagulant syndrome: Secondary | ICD-10-CM

## 2020-02-02 NOTE — ED Provider Notes
HistoryChief Complaint Patient presents with ? Abdominal Pain  69 year old female with PMH of ischemic colitis 2015, DM, high cholesterol, ventral hernia, and multiple previous bowl obstructions is presenting to the emergency department for evaluation of abdominal pain, nausea, and vomiting.  She reports that her symptoms started at about 11:00 p.m. last night.  Reports a generalized abdominal pain that is similar to previous bowel obstructions. She has had a few episodes of nausea and vomiting. No bowel movement since yesterday, passing only a small amount of gas. The history is provided by the patient. No language interpreter was used. Abdominal PainPain location:  GeneralizedPain quality: fullness, pressure and sharp  Pain radiates to:  Does not radiatePain severity:  ModerateOnset quality:  GradualTiming:  ConstantProgression:  UnchangedChronicity:  NewRelieved by:  NothingWorsened by:  NothingIneffective treatments:  None triedAssociated symptoms: nausea and vomiting  Associated symptoms: no chest pain, no chills, no constipation, no diarrhea, no fever and no shortness of breath   Past Medical History: Diagnosis Date ? Acute ischemic colitis (HC Code) 2015 ? Dental crown present   UPPER FRONT CAP ? Depression   Receving treatment at [facility] ? Diabetes mellitus (HC Code)  ? Fibroid   2003 ? GERD (gastroesophageal reflux disease)   SINCE WT LOSS AND DIET CHANGE PT NO LONGER HAS PROBLEMS WITH GERD ? Hypercholesteremia  ? PONV (postoperative nausea and vomiting)  ? Rosacea  ? Ventral hernia with bowel obstruction  ? Vertigo  Past Surgical History: Procedure Laterality Date ? ABDOMINAL ADHESION SURGERY  2016 ? BLEPHAROPLASTY Bilateral 05/2016 ? COLOSTOMY  05/27/13 ? COLOSTOMY CLOSURE   ? KNEE LIGAMENT RECONSTRUCTION Right 2004 ? LAPAROSCOPIC NISSEN FUNDOPLICATION    2000 ? ? SINUS SURGERY   ? THYROID BIOPSY NEGATIVE PER PATIENT ? TUBAL LIGATION   ? UMBILICAL HERNIA REPAIR   ? VENTRAL HERNIA REPAIR   Family History Problem Relation Age of Onset ? Cancer Mother       bladder cancer ? Bladder cancer Mother  ? Colon cancer Paternal Aunt  ? Colon cancer Cousin       died of colona cancer at age 60 ? Breast cancer Cousin       2 cousins with breast cancer ? Colon cancer Father  ? Cancer Sister  Social History Socioeconomic History ? Marital status: Married   Spouse name: Not on file ? Number of children: Not on file ? Years of education: Not on file ? Highest education level: Not on file Tobacco Use ? Smoking status: Never Smoker ? Smokeless tobacco: Never Used Vaping Use ? Vaping Use: Never used Substance and Sexual Activity ? Alcohol use: Yes   Comment: once or twice a year ? Drug use: No ? Sexual activity: Yes   Partners: Male   Comment: with husband Social History Narrative  10/23/14:  Lives w/ husband and family in a 2 story home w/ 2 sets of 11 stairs w/ 1 rail to enter. She was indep w/ ADLs and amb w/out AD.     ED Other Social History ? E-cigarette status Never User  ? E-Cigarette Use Never User  E-cigarette/Vaping Substances E-cigarette/Vaping Devices Review of Systems Constitutional: Negative for chills and fever. Respiratory: Negative for shortness of breath.  Cardiovascular: Negative for chest pain. Gastrointestinal: Positive for abdominal pain, nausea and vomiting. Negative for constipation and diarrhea. Genitourinary: Negative for frequency and urgency. Neurological: Negative for light-headedness and headaches. All other systems reviewed and are negative. Physical ExamED Triage Vitals [01/03/20 1300]BP: (!) 162/77Pulse: (!) 110Pulse from  O2 sat: n/aResp: 16Temp: (!) 96.6 ?F (35.9 ?C)Temp src: TemporalSpO2: 99 % BP 117/69  - Pulse (!) 96  - Temp 99.1 ?F (37.3 ?C) (Oral)  - Resp 16  - SpO2 95% Physical ExamVitals and nursing note reviewed. Constitutional:     General: She is not in acute distress.   Appearance: She is well-developed. She is not diaphoretic. HENT:    Head: Normocephalic and atraumatic.    Nose: Nose normal.    Mouth/Throat:    Mouth: Mucous membranes are moist.    Pharynx: Oropharynx is clear. Eyes:    General: No scleral icterus.Cardiovascular:    Rate and Rhythm: Normal rate and regular rhythm.    Heart sounds: Normal heart sounds. Pulmonary:    Effort: Pulmonary effort is normal. No respiratory distress. Abdominal:    General: There is distension.    Tenderness: There is generalized abdominal tenderness. There is no guarding or rebound. Musculoskeletal:       General: No deformity. Normal range of motion.    Cervical back: Normal range of motion. Skin:   General: Skin is warm and dry. Neurological:    Mental Status: She is alert and oriented to person, place, and time. Psychiatric:       Behavior: Behavior normal.  ProceduresProceduresResident/APP MDM:BP (!) 147/74  - Pulse (!) 99  - Temp 98.3 ?F (36.8 ?C) (Oral)  - Resp 18  - SpO2 98% Differential diagnosis: concern for recurrent SBO, consider colitis, incarcerated ventral hernia, cholelithiasis, cholecystitis Plan: CBC, BMP, Lipase, LFT's, UA, [place] abdomen pelvis, IVF, morphine Plan discussed with Dr. Alena Bills Results:WBC 12.0 [place] Abdomen Pelvis w IV Contrast with oral contrast Final Result  No evidence of bowel obstruction.  Multiple ventral hernias, some of  which have increased in size compared to prior.ED Course:Patient was able to tolerate food and water, reported improvement in her pain Ordered Tums for low calcium and patient also reporting acid reflux symptoms Advised outpatient COVID test given symptoms and lymphocytosis Has follow up with her general surgeon scheduled for Monday Advised return precautions Merril Abbe, PA-CYNHH Emergency Medicine PA ED COURSEReviewed previous: previous chart and labsInterpreted by ED Provider: pulse oximetry, [place] scan and labsPatient Reevaluation: ED Attestation: PA/APRNFace to face evaluation was performed by me in collaboration with the Advanced Practice Provider to assess for significant health threats.69 year old female with a history of ischemic colitis, diabetes, multiple previous bowel obstructions here with abdominal pain, nausea, vomiting.  The pain began about 1:00 p.m. last night, since then she has noted diffuse abdominal pain, similar to prior obstructions.  She has had several episodes of vomiting.  No bowel movement since yesterday morning.Denies chest pain, shortness of breath, dysuria, back pain.On exam the patient has normal vital signs.  Abdomen is soft and tender to palpation diffusely.  No rebound or guarding.Labs are reassuring.[place] pendingPooja Alena Bills MD, MPHDictation device and software may have been used during this encounter, please excuse any spelling, word substitution, or punctuation errors. Leeanne Rio, MD, MSc, DTM&HMobile heartbeat #: [phone] care, pending [place], w h/o SBO in past. Of note, w elevated WBC and L shift. Also lymphopenic. Will add COVID-19 test now, continue to f/u on  results. Also giving fuids, pt has 2+ ketones in urine.Clinical Impressions as of Jan 03 2024 Generalized abdominal pain  ED DispositionDischarge Merril Abbe, PA09/08/21 2025 Renie Ora, MD09/13/21 0945 Leeanne Rio, MD10/08/21 Jolee Ewing, MD10/08/21 [phone]

## 2020-02-06 ENCOUNTER — Ambulatory Visit: Admit: 2020-02-06 | Payer: PRIVATE HEALTH INSURANCE | Attending: Medical Oncology | Primary: Internal Medicine

## 2020-02-06 ENCOUNTER — Encounter: Admit: 2020-02-06 | Payer: PRIVATE HEALTH INSURANCE | Attending: Medical Oncology | Primary: Internal Medicine

## 2020-02-06 ENCOUNTER — Ambulatory Visit: Admit: 2020-02-06 | Payer: PRIVATE HEALTH INSURANCE | Attending: Medical | Primary: Internal Medicine

## 2020-02-06 ENCOUNTER — Encounter: Admit: 2020-02-06 | Payer: PRIVATE HEALTH INSURANCE | Attending: Pharmacotherapy | Primary: Internal Medicine

## 2020-02-06 ENCOUNTER — Inpatient Hospital Stay: Admit: 2020-02-06 | Discharge: 2020-02-06 | Payer: PRIVATE HEALTH INSURANCE | Primary: Internal Medicine

## 2020-02-06 ENCOUNTER — Encounter: Admit: 2020-02-06 | Payer: PRIVATE HEALTH INSURANCE | Attending: Medical | Primary: Internal Medicine

## 2020-02-06 ENCOUNTER — Ambulatory Visit: Admit: 2020-02-06 | Payer: PRIVATE HEALTH INSURANCE | Attending: Pharmacotherapy | Primary: Internal Medicine

## 2020-02-06 DIAGNOSIS — E119 Type 2 diabetes mellitus without complications: Secondary | ICD-10-CM

## 2020-02-06 DIAGNOSIS — F32A Depression: Secondary | ICD-10-CM

## 2020-02-06 DIAGNOSIS — R42 Dizziness and giddiness: Secondary | ICD-10-CM

## 2020-02-06 DIAGNOSIS — K559 Vascular disorder of intestine, unspecified: Secondary | ICD-10-CM

## 2020-02-06 DIAGNOSIS — Z5181 Encounter for therapeutic drug level monitoring: Secondary | ICD-10-CM

## 2020-02-06 DIAGNOSIS — K6389 Other specified diseases of intestine: Secondary | ICD-10-CM

## 2020-02-06 DIAGNOSIS — D6862 Lupus anticoagulant syndrome: Secondary | ICD-10-CM

## 2020-02-06 DIAGNOSIS — Z933 Colostomy status: Secondary | ICD-10-CM

## 2020-02-06 DIAGNOSIS — K5732 Diverticulitis of large intestine without perforation or abscess without bleeding: Secondary | ICD-10-CM

## 2020-02-06 DIAGNOSIS — E7800 Pure hypercholesterolemia, unspecified: Secondary | ICD-10-CM

## 2020-02-06 DIAGNOSIS — Z7901 Long term (current) use of anticoagulants: Secondary | ICD-10-CM

## 2020-02-06 DIAGNOSIS — N840 Polyp of corpus uteri: Secondary | ICD-10-CM

## 2020-02-06 DIAGNOSIS — L719 Rosacea, unspecified: Secondary | ICD-10-CM

## 2020-02-06 DIAGNOSIS — K219 Gastro-esophageal reflux disease without esophagitis: Secondary | ICD-10-CM

## 2020-02-06 DIAGNOSIS — R112 Nausea with vomiting, unspecified: Secondary | ICD-10-CM

## 2020-02-06 DIAGNOSIS — Z780 Asymptomatic menopausal state: Secondary | ICD-10-CM

## 2020-02-06 DIAGNOSIS — H1013 Acute atopic conjunctivitis, bilateral: Secondary | ICD-10-CM

## 2020-02-06 DIAGNOSIS — R110 Nausea: Secondary | ICD-10-CM

## 2020-02-06 DIAGNOSIS — K436 Other and unspecified ventral hernia with obstruction, without gangrene: Secondary | ICD-10-CM

## 2020-02-06 DIAGNOSIS — D219 Benign neoplasm of connective and other soft tissue, unspecified: Secondary | ICD-10-CM

## 2020-02-06 DIAGNOSIS — K56609 Unspecified intestinal obstruction, unspecified as to partial versus complete obstruction: Secondary | ICD-10-CM

## 2020-02-06 DIAGNOSIS — Z98811 Dental restoration status: Secondary | ICD-10-CM

## 2020-02-06 DIAGNOSIS — N950 Postmenopausal bleeding: Secondary | ICD-10-CM

## 2020-02-06 DIAGNOSIS — Z8719 Personal history of other diseases of the digestive system: Secondary | ICD-10-CM

## 2020-02-06 DIAGNOSIS — Z9889 Other specified postprocedural states: Secondary | ICD-10-CM

## 2020-02-06 DIAGNOSIS — J342 Deviated nasal septum: Secondary | ICD-10-CM

## 2020-02-06 DIAGNOSIS — H02831 Dermatochalasis of right upper eyelid: Secondary | ICD-10-CM

## 2020-02-06 DIAGNOSIS — K55039 Acute (reversible) ischemia of large intestine, extent unspecified: Secondary | ICD-10-CM

## 2020-02-06 LAB — COMPREHENSIVE METABOLIC PANEL
BKR A/G RATIO: 1.1 % (ref 1.0–2.2)
BKR ALANINE AMINOTRANSFERASE (ALT): 14 U/L (ref 10–35)
BKR ALBUMIN: 4.4 g/dL (ref 3.6–4.9)
BKR ALKALINE PHOSPHATASE: 134 U/L — ABNORMAL HIGH (ref 9–122)
BKR ANION GAP: 10 (ref 7–17)
BKR ASPARTATE AMINOTRANSFERASE (AST): 19 U/L (ref 10–35)
BKR AST/ALT RATIO: 1.4
BKR BILIRUBIN TOTAL: 0.3 mg/dL (ref <=1.2)
BKR BLOOD UREA NITROGEN: 20 mg/dL (ref 8–23)
BKR BUN / CREAT RATIO: 22.7 (ref 8.0–23.0)
BKR CALCIUM: 9.5 mg/dL (ref 8.8–10.2)
BKR CHLORIDE: 101 mmol/L (ref 98–107)
BKR CO2: 25 mmol/L (ref 20–30)
BKR CREATININE: 0.88 mg/dL (ref 0.40–1.30)
BKR EGFR (AFR AMER): >60 mL/min/1.73m2 (ref >60)
BKR EGFR (NON AFRICAN AMERICAN): >60 mL/min/1.73m2 (ref >60)
BKR GLOBULIN: 3.9 g/dL — ABNORMAL HIGH (ref 2.3–3.5)
BKR GLUCOSE: 139 mg/dL — ABNORMAL HIGH (ref 70–100)
BKR POTASSIUM: 4.7 mmol/L (ref 3.3–5.1)
BKR PROTEIN TOTAL: 8.3 g/dL (ref 6.6–8.7)
BKR SODIUM: 136 mmol/L (ref 136–144)

## 2020-02-06 LAB — CBC WITH AUTO DIFFERENTIAL
BKR WAM ABSOLUTE IMMATURE GRANULOCYTES: 0 x 1000/??L (ref 0.0–0.3)
BKR WAM ABSOLUTE LYMPHOCYTE COUNT: 1.9 x 1000/ÂµL (ref 1.0–4.0)
BKR WAM ABSOLUTE NRBC: 0 x 1000/ÂµL (ref 0.0–0.0)
BKR WAM ANALYZER ANC: 7.8 x 1000/ÂµL (ref 1.0–11.0)
BKR WAM BASOPHIL ABSOLUTE COUNT: 0.1 x 1000/??L — ABNORMAL HIGH (ref 0.0–0.0)
BKR WAM BASOPHILS: 0.6 % (ref 0.0–4.0)
BKR WAM EOSINOPHIL ABSOLUTE COUNT: 0.2 x 1000/ÂµL (ref 0.0–1.0)
BKR WAM EOSINOPHILS: 1.4 % (ref 0.0–7.0)
BKR WAM HEMATOCRIT: 44.8 % (ref 37.0–52.0)
BKR WAM HEMOGLOBIN: 13.8 g/dL (ref 12.0–18.0)
BKR WAM IMMATURE GRANULOCYTES: 0.3 % (ref 0.0–3.0)
BKR WAM LYMPHOCYTES: 17.9 % (ref 8.0–49.0)
BKR WAM MCH (PG): 25.8 pg — ABNORMAL LOW (ref 27.0–31.0)
BKR WAM MCHC: 30.8 g/dL — ABNORMAL LOW (ref 31.0–36.0)
BKR WAM MCV: 83.9 fL — ABNORMAL HIGH (ref 78.0–94.0)
BKR WAM MONOCYTE ABSOLUTE COUNT: 0.5 x 1000/ÂµL (ref 0.0–2.0)
BKR WAM MONOCYTES: 5.1 % (ref 4.0–15.0)
BKR WAM MPV: 11.1 fL — ABNORMAL HIGH (ref 6.0–11.0)
BKR WAM NEUTROPHILS: 74.7 % (ref 37.0–84.0)
BKR WAM NUCLEATED RED BLOOD CELLS: 0 % (ref 0.0–1.0)
BKR WAM PLATELETS: 335 x1000/ÂµL (ref 140–440)
BKR WAM RDW-CV: 16.1 % — ABNORMAL HIGH (ref 11.5–14.5)
BKR WAM RED BLOOD CELL COUNT: 5.3 M/??L (ref 3.8–5.9)
BKR WAM WHITE BLOOD CELL COUNT: 10.4 x1000/??L — ABNORMAL HIGH (ref 4.0–10.0)

## 2020-02-06 LAB — PROTIME AND INR
BKR INR: 3.94 — ABNORMAL HIGH (ref 0.91–1.05)
BKR INR: 38.9 seconds — ABNORMAL HIGH (ref 10.0–11.6)
BKR PROTHROMBIN TIME: 38.9 s — ABNORMAL HIGH (ref 10.0–11.6)

## 2020-02-06 LAB — PT/INR AND PTT (BH GH L LMW YH)
BKR INR: 4.34 — ABNORMAL HIGH (ref 0.91–1.05)
BKR PARTIAL THROMBOPLASTIN TIME: 53.7 s — ABNORMAL HIGH (ref 22.0–30.3)
BKR PROTHROMBIN TIME: 42.5 s — ABNORMAL HIGH (ref 10.0–11.6)

## 2020-02-06 MED ORDER — JARDIANCE 25 MG TABLET
25 mg | Status: AC
Start: 2020-02-06 — End: ?

## 2020-02-06 NOTE — Progress Notes
Re: Maria [name] ([birth year])MRN: [id number] Provider: [name] PADate of service: 10/12/2021FOLLOWUP NOTEDIAGNOSIS:Lupus anticoagulant--+drrvt with negative serologies for common lupus anticoagulantsIschemic colitis 2015CURRENT THERAPY:Warfarin 5 mg QDPRIOR THERAPY:ColectomyINTERVAL HISTORY:Maria [name] is here for follow-up of her lupus anticoagulant.She is very tired, feeling exhausted from trying to manage her diabetes and her getting her INR checked.  She is scheduled to have surgery to repair her abdominal hernias.Appetite is good.  She is moving her bowels regularly.  She denies any nose bleeds or blood in her stool.  She has no pain.Review of Systems Constitutional: Positive for fatigue. Negative for chills and fever. HENT:   Negative for nosebleeds and trouble swallowing.  Respiratory: Negative for shortness of breath.  Cardiovascular: Negative for chest pain and leg swelling. Gastrointestinal: Negative for abdominal pain, diarrhea, nausea and vomiting. Genitourinary: Negative for difficulty urinating.  Musculoskeletal: Negative for arthralgias. Skin: Negative for rash. Neurological: Negative for dizziness and headaches. Hematological: Negative for adenopathy. Psychiatric/Behavioral: Negative for depression. The patient is not nervous/anxious.  Medication: Medication reconciliation was performed and available in the electronic medical record.Current Outpatient Medications Medication Sig ? acetaminophen (TYLENOL) 500 mg tablet Take 1 tablet (500 mg total) by mouth every 4 (four) hours as needed. ? ascorbic acid (VITAMIN C ORAL) Take 1 tablet by mouth daily. ? ascorbic acid/collagen hydr (COLLAGEN PLUS VITAMIN C ORAL) Take 1 tablet by mouth every other day.. ? BIOTIN ORAL Take 1 tablet by mouth every other day. ? cholecalciferol, vitamin D3, (VITAMIN D3) 5,000 unit Tab Take 1 tablet by mouth every other day.. ? gabapentin (NEURONTIN) 100 mg capsule Take 100 mg by mouth at bedtime. ? JARDIANCE 25 mg tablet 1 TABLET BY MOUTH ONCE DAILY IN THE MORNING FOR 30 DAYS ? meclizine (ANTIVERT) 12.5 mg tablet Take 1 tablet (12.5 mg total) by mouth daily as needed for Dizziness ? melatonin 10 mg Tab Take 10 mg by mouth nightly as needed.  ? multivitamin tablet Take 1 tablet by mouth daily. ? olopatadine (PATADAY) 0.2 % Drop Place 1 drop into both eyes daily (Patient not taking: Reported on 11/29/2019) ? ondansetron (ZOFRAN) 4 MG tablet Take 4 mg by mouth every 8 (eight) hours as needed for Nausea. ? phytonadione, vit K1, (VITAMIN K) 100 mcg tablet Take 100 mcg by mouth daily. ? polyethylene glycol (MIRALAX) 17 gram packet Take 1 packet (17 g total) by mouth daily. Mix in 8 ounces of water, juice, soda, coffee or tea prior to taking. ? prasterone, dhea,-calcium carb (DHEA) 10 mg-47 mg calcium Tab Take by mouth. ? pravastatin (PRAVACHOL) 40 MG tablet Take 40 mg by mouth nightly. ? senna (SENOKOT) 8.6 mg tablet Take 2 tablets by mouth nightly.. ? sitaGLIPtin (JANUVIA) 100 MG tablet Take 100 mg by mouth daily.. ? sodium chloride (SODIUM CHLORIDE) 0.65 % nasal spray 1 spray by Each Nare route as needed for Congestion..  ? venlafaxine (EFFEXOR XR) 150 mg XR 24 hr extended release capsule Take 150 mg by mouth daily.  ? warfarin (COUMADIN) 5 mg tablet TAKE 1.5 TABLETS (7.5MG ) ON MONDAY AND FRIDAY. TAKE 1 TAB (5 MG) ALL OTHER DAYS (TUES,WEDS,THURS,SAT,SUN) ? ZINC ORAL Take by mouth daily.  No current facility-administered medications for this visit. PHYSICAL EXAM:Vitals:  02/06/20 1243 BP: 133/77 Pulse: 85 Resp: 18 Temp: 97.5 ?F (36.4 ?C) Weight: 71.6 kg GEN: WD/WN middle aged female in no acute distressEYES: sclera anicteric, no ptosis; pupils equal, reactive to light.ENT:  DeferredRESP: normal respiratory effort; lungs clear to auscultation.CV: regular rate and rhythm, normal S1 and S2, no murmurs;  no pedal edema.ABDOMEN: BS(+),  soft, nontender. No distension. No palpable masses; no hepato-splenomegaly. Tw0 well healed surgical scars. LYMPH: no palpable cervical, supraclavicular, or axillary adenopathy.MUSCULOSKELETAL: no clubbing or cyanosis; Spine straight and nontender.SKIN: no rash or petechiae.PSYCH: judgement and insight normal; no depression or anxiety.NEURO: alert and oriented x 3; no focal motor deficits. ECOG Performance Scale:  0Complete Blood Count:Recent Labs   10/12/211249 WBC 10.4* HGB 13.8 HCT 44.8 PLT 335 Comprehensive Metabolic Panel:Lab Results Component Value Date  GLU 139 (H) 02/06/2020  GLU 236 (H) 10/24/2014  BUN 20 02/06/2020  CREATININE 0.88 02/06/2020  NA 136 02/06/2020  K 4.7 02/06/2020  CL 101 02/06/2020  CO2 25 02/06/2020  ALBUMIN 4.4 02/06/2020  PROT 8.3 02/06/2020  BILITOT 0.3 02/06/2020  ALKPHOS 134 (H) 02/06/2020  ALT 14 02/06/2020  ALT 13 10/22/2014  GLOB 3.9 (H) 02/06/2020  CALCIUM 9.5 02/06/2020  EGFR >60 11/10/2011 IMPRESSION:Maria [name] is a 69 year old female with a history of ischemic colitis in the setting of positive lupus anticoagulant.  She is status post colectomy with reversal.  She continues on Coumadin daily.  She is not a candidate for DOAC at this time due to lack of equivalents to warfarin in antiphospholipid antibody positive patients.PLAN:Continue Coumadin as directed, pending INR.She follows regularly with our Coumadin clinic.Return in 1 year for follow-up.[name] PAElectronically Signed by [name], PA, February 06, 2020

## 2020-02-07 ENCOUNTER — Encounter: Admit: 2020-02-07 | Payer: PRIVATE HEALTH INSURANCE | Attending: Surgery of the Hand | Primary: Internal Medicine

## 2020-02-07 NOTE — Progress Notes
Pharmacist Anticoagulation Clinic Telephone Visit Follow Maria [name] (Oct 24, 1950) is on chronic anticoagulation for the diagnosis of  Lupus anticoagulant with hypercoagulable state (HC Code) (HC CODE)  (primary encounter diagnosis) Ischemic colitis (HC Code) (HC CODE) with an INR goal of 2.0-3.0.  The patient was contacted regarding lab drawn INR results.  Patient's INR findings and prescribed dose from last visit:Anticoagulation Monitoring Latest Ref Rng & Units 01/16/2020 01/30/2020 02/06/2020 INR 2.0 - 3.0 - - - INR 0.91 - 1.10 - - - INR (External) 0.80 - 1.15 - - - Assoc. INR Date - 01/15/2020 01/29/2020 02/06/2020 Associated INR - 3.18 3.10 4.34 Instructions - 2.5 mg every Wed; 5 mg all other days 2.5 mg every Wed; 5 mg all other days 10/12: Hold; Otherwise 2.5 mg every Wed, Fri; 5 mg all other days Last 7 day dose total - 32.5 mg 32.5 mg 32.5 mg Next INR Date - 01/23/2020 02/05/2020 02/12/2020 The following findings were reported from today's call (no findings if none selected):[]  Missed doses []  Extra doses []  Change in medications []  Change in vitamin k rich food intake (green vegetables, herbals, etc) []  Change in alcohol use []  Change in overall health []  Recent hospitalization / emergency department visit []  Upcoming procedure (including dental procedures) []  Other Comments:       LVM asking patient to call the pharmacy with any changes.  The following side effects were reported from today's call (no side effects if none selected):[]  Bleeding []  New bruising []  Warfarin-emergency department visit or hospital admission []  Other Comments:   LVM asking patient to call the pharmacy with any warfarin related side effects.  Assessment/Plan:Patient's INR is 4.34, which is above goal range of 2.0-3.0.  She was instructed to hold her dose of warfarin tonight (10/12), then reduce her weekly dose about 7% (based on tablet strength available) to 2.5mg  on Wednesday, Friday and 5mg  all other days of the week as indicated in the maintenance plan below.  Next INR assessment due Monday, 02/12/20.  Provider was messaged with this plan. Anticoagulation Summary  As of 02/06/2020  INR goal:  2.0-3.0 TTR:  46.1 % (5.7 y) INR used for dosing:  4.34 (02/06/2020) Warfarin maintenance plan:  2.5 mg (5 mg x 0.5) every Wed, Fri; 5 mg (5 mg x 1) all other days Weekly warfarin total:  30 mg Plan last modified:  Markus Daft, [facility] (02/06/2020) Next INR check:  02/12/2020 Priority:  High Priority Target end date:  Indefinite  Indications  Lupus anticoagulant with hypercoagulable state (HC Code) (HC CODE) [D68.62]Ischemic colitis (HC Code) (HC CODE) [K55.9]   Anticoagulation Episode Summary   INR check location:    Preferred lab:    Send INR reminders to:  [facility] [place] INR POOL  Comments:    Anticoagulation Care Providers   Provider Role Specialty Phone number  [name], MD Referring Medical Oncology [phone]  [place], [name], APRN Responsible Medical Oncology [phone]  All patient's medications have been reviewed and updated as needed.  Electronically Signed by Markus Daft, RPH, October 12, 2021[facility]  [address] [place], [place] [address]  [phone]  Fax: [phone]

## 2020-02-08 LAB — MIXING STUDY PTT     (BH GH YH)
BKR PTT 1:1 MIX 0": 31 s (ref 22.0–35.0)
BKR PTT 1:1 MIX 60": 33.3 s (ref 22.0–35.0)
BKR PTT 1:1 MIX CONTROL 0": 27.4 s
BKR PTT 1:1 MIX CONTROL 60": 29.1 s
BKR WAM ABSOLUTE LYMPHOCYTE COUNT: 31 seconds (ref 22.0–35.0)

## 2020-02-08 LAB — PTT- POLYBRENE (LAB ORDERABLE ONLY) (YH): BKR PTT - POLYBRENE: 64.9

## 2020-02-08 LAB — SPECIAL COAGULATION MD INTERPRETATION (LAB ORDERABLE ONLY) (GH YH)

## 2020-02-08 LAB — LUPUS ANTICOAGULANT     (BH GH L LMW YH)
BKR DRV NORMALIZED RATIO: 1.27 — ABNORMAL HIGH (ref <=1.20)
BKR SCT NORMALIZED RATIO: 0.95 (ref <=1.20)

## 2020-02-08 NOTE — Progress Notes
Pharmacist Anticoagulation Clinic Telephone Visit Follow Maria [name] ([birth year]) is on chronic anticoagulation for the diagnosis of  Lupus anticoagulant with hypercoagulable state (HC Code) (HC CODE)  (primary encounter diagnosis) Ischemic colitis (HC Code) (HC CODE) with an INR goal of 2.0-3.0.  The patient was contacted regarding lab drawn INR results.  Patient's INR findings and prescribed dose from last visit:Anticoagulation Monitoring Latest Ref Rng & Units 12/27/2019 01/16/2020 01/30/2020 INR 2.0 - 3.0 - - - INR 0.91 - 1.10 - - - INR (External) 0.80 - 1.15 - - - Assoc. INR Date - 12/27/2019 01/15/2020 01/29/2020 Associated INR - 3.30 3.18 3.10 Instructions - 2.5 mg every Wed; 5 mg all other days 2.5 mg every Wed; 5 mg all other days 2.5 mg every Wed; 5 mg all other days Last 7 day dose total - 32.5 mg 32.5 mg 32.5 mg Next INR Date - 01/03/2020 01/23/2020 - Findings from today's call:Unable to assess, left voice message. Side effects reported:Unable to assess, left voice message. Assessment/Plan:Patient's INR is 3.10, which is above goal range of 2.0-3.0.  Patient's chart was reviewed and voice message was left instructing patient to call the clinic if there have been any changes to prescription or over the counter medications or diet, if experiencing a current illness, if there are any planned upcoming invasive/dental procedures or any other changes that could affect warfarin therapy.Seeing that her INR has been hovering just above her INR goal range at 3.1-3.2 in the last 2 weeks, she was instructed to continue current weekly dose of warfarin (2.5 mg on Wed, 5 mg all other days) and to increase the number of times she eats mixed veggies with broccoli and carrots to 3-4x/week from 2x/week.  Next INR assessment due 1 week. This information was routed to the covering provider for review.Tried calling x 2 on 01/30/20. Anticoagulation Summary  As of 01/30/2020 INR goal:  2.0-3.0 TTR:  46.3 % (5.7 y) INR used for dosing:  3.10 (01/29/2020) Warfarin maintenance plan:  2.5 mg (5 mg x 0.5) every Wed; 5 mg (5 mg x 1) all other days Weekly warfarin total:  32.5 mg Plan last modified:  [name], PharmD (01/30/2020) Next INR check:  02/12/2020 Priority:  High Priority Target end date:  Indefinite  Indications  Lupus anticoagulant with hypercoagulable state (HC Code) (HC CODE) [D68.62]Ischemic colitis (HC Code) (HC CODE) [K55.9]   Anticoagulation Episode Summary   INR check location:    Preferred lab:    Send INR reminders to:  [facility] [place] INR POOL  Comments:    Anticoagulation Care Providers   Provider Role Specialty Phone number  [name], MD Referring Medical Oncology [phone]  [place], [name], APRN Responsible Medical Oncology [phone]  All patient's medications have been reviewed and updated as needed.  Electronically Signed by [name], PharmD, October 5, 2021[facility]  [address], [place], [place] [address]  Phone:[phone]  Fax: [phone]

## 2020-02-14 ENCOUNTER — Encounter: Admit: 2020-02-14 | Payer: PRIVATE HEALTH INSURANCE | Attending: Surgery of the Hand | Primary: Internal Medicine

## 2020-02-14 ENCOUNTER — Ambulatory Visit: Admit: 2020-02-14 | Payer: PRIVATE HEALTH INSURANCE | Attending: Surgery of the Hand | Primary: Internal Medicine

## 2020-02-14 DIAGNOSIS — D219 Benign neoplasm of connective and other soft tissue, unspecified: Secondary | ICD-10-CM

## 2020-02-14 DIAGNOSIS — R112 Nausea with vomiting, unspecified: Secondary | ICD-10-CM

## 2020-02-14 DIAGNOSIS — L719 Rosacea, unspecified: Secondary | ICD-10-CM

## 2020-02-14 DIAGNOSIS — Z98811 Dental restoration status: Secondary | ICD-10-CM

## 2020-02-14 DIAGNOSIS — F32A Depression: Secondary | ICD-10-CM

## 2020-02-14 DIAGNOSIS — K219 Gastro-esophageal reflux disease without esophagitis: Secondary | ICD-10-CM

## 2020-02-14 DIAGNOSIS — R42 Dizziness and giddiness: Secondary | ICD-10-CM

## 2020-02-14 DIAGNOSIS — K436 Other and unspecified ventral hernia with obstruction, without gangrene: Secondary | ICD-10-CM

## 2020-02-14 DIAGNOSIS — E7800 Pure hypercholesterolemia, unspecified: Secondary | ICD-10-CM

## 2020-02-14 DIAGNOSIS — K55039 Acute (reversible) ischemia of large intestine, extent unspecified: Secondary | ICD-10-CM

## 2020-02-14 DIAGNOSIS — M65341 Trigger finger, right ring finger: Secondary | ICD-10-CM

## 2020-02-14 DIAGNOSIS — G5603 Carpal tunnel syndrome, bilateral upper limbs: Secondary | ICD-10-CM

## 2020-02-14 DIAGNOSIS — E119 Type 2 diabetes mellitus without complications: Secondary | ICD-10-CM

## 2020-02-14 MED ORDER — TRIAMCINOLONE ACETONIDE 40 MG/ML SUSPENSION FOR INJECTION
40 mg/mL | Freq: Once | Status: CP
Start: 2020-02-14 — End: ?
  Administered 2020-02-14: 20:00:00 40 mL

## 2020-02-14 NOTE — Progress Notes
FI:EPPIRJJ finger right ringHPI:Maria [name] is a  69 y.o. female complaining of right ring trigger finger. Today the patient reports her right ring finger is locking and painful. She has not had this issue before. It began 2-4 months ago. She denies any clicking. She says she used to work a lot with her hands and they get numb at night time. She sleeps with a glove that she says helps.She is a patient of mine who is going to have a hernia repair - surgery is being coordinated with Dr [name].Review of Systems Constitutional: Negative.  Negative for chills and fever. Musculoskeletal:      + abdominal pain+ pain and locking of right ring finger+ b/l hand numbness Skin: Negative.  Negative for itching and rash. All other systems reviewed and are negative.Occupational History ? Not on file Allergies:She is allergic to morphine; biaxin  [clarithromycin]; and latex, natural rubber.Medications:She has a current medication list which includes the following prescription(s): acetaminophen (TYLENOL) 500 mg tablet - Take 1 tablet (500 mg total) by mouth every 4 (four) hours as needed, ascorbic acid (VITAMIN C ORAL) - Take 1 tablet by mouth daily, ascorbic acid/collagen hydr (COLLAGEN PLUS VITAMIN C ORAL) - Take 1 tablet by mouth every other day., BIOTIN ORAL - Take 1 tablet by mouth every other day, cholecalciferol, vitamin D3, (VITAMIN D3) 5,000 unit Tab - Take 1 tablet by mouth every other day., gabapentin (NEURONTIN) 100 mg capsule - Take 100 mg by mouth at bedtime, JARDIANCE 25 mg tablet - 1 TABLET BY MOUTH ONCE DAILY IN THE MORNING FOR 30 DAYS, meclizine (ANTIVERT) 12.5 mg tablet - Take 1 tablet (12.5 mg total) by mouth daily as needed for Dizziness, melatonin 10 mg Tab - Take 10 mg by mouth nightly as needed, multivitamin tablet - Take 1 tablet by mouth daily, olopatadine (PATADAY) 0.2 % Drop - Place 1 drop into both eyes daily, ondansetron (ZOFRAN) 4 MG tablet - Take 4 mg by mouth every 8 (eight) hours as needed for Nausea, phytonadione, vit K1, (VITAMIN K) 100 mcg tablet - Take 100 mcg by mouth daily, polyethylene glycol (MIRALAX) 17 gram packet - Take 1 packet (17 g total) by mouth daily. Mix in 8 ounces of water, juice, soda, coffee or tea prior to taking, prasterone, dhea,-calcium carb (DHEA) 10 mg-47 mg calcium Tab - Take by mouth, pravastatin (PRAVACHOL) 40 MG tablet - Take 40 mg by mouth nightly, senna (SENOKOT) 8.6 mg tablet - Take 2 tablets by mouth nightly., sitaGLIPtin (JANUVIA) 100 MG tablet - Take 100 mg by mouth daily., sodium chloride (SODIUM CHLORIDE) 0.65 % nasal spray - 1 spray by Each Nare route as needed for Congestion., venlafaxine (EFFEXOR XR) 150 mg XR 24 hr extended release capsule - Take 150 mg by mouth daily, warfarin (COUMADIN) 5 mg tablet - TAKE 1.5 TABLETS (7.5MG ) ON MONDAY AND FRIDAY. TAKE 1 TAB (5 MG) ALL OTHER DAYS (TUES,WEDS,THURS,SAT,SUN), and ZINC ORAL - Take by mouth daily.Medical History:She  has a past medical history of Acute ischemic colitis (HC Code) (HC CODE) (2015), Dental crown present, Depression, Diabetes mellitus (HC Code) (HC CODE), Fibroid, GERD (gastroesophageal reflux disease), Hypercholesteremia, PONV (postoperative nausea and vomiting), Rosacea, Ventral hernia with bowel obstruction, and Vertigo.Surgical History: She  has a past surgical history that includes Knee ligament reconstruction (Right, 2004); Laparoscopic Nissen fundoplication; Umbilical hernia repair; Ventral hernia repair; Tubal ligation; THYROID BIOPSY; 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 Social History:She  reports that she has  never smoked. She has never used smokeless tobacco. She reports current alcohol use. She reports that she does not use drugs.Family History:Her family history includes Bladder cancer in her mother; Breast cancer in her cousin; Cancer in her mother and sister; Colon cancer in her cousin, father, and paternal aunt. Exam: Constitutional: Shamecka is in no distress.Skin: Warm and dry.Neurologic: No sensory or motor abnormalities. Neck: normal Abd: tenderness over ring finger A1 pulley, incomplete flexion, normal sensation, negative tinel's, positive Phalen's test on the leftData Reviewed:previous medical recordsDiagnosis:1. Trigger ring finger of right hand   2. Carpal tunnel syndrome, bilateral    In addition to her hernia she has trigger finger and b/l CTS. We discussed surgery and steroid injection today. She does not want surgery for her carpal tunnel as it is being managed with splinting but we will perform a steroid injection.Discussed risks and benefits of steroid injection vs surgeryPatient decided to proceed with steroid injection0.5cc kenalog-40 (NDC # [id number]) plus 0.5 cc lidocaine 1% infiltrated into the flexor tendon sheath of the right ring finger under ultrasound guidance.Tolerated well.Follow-Up: Patient will follow up with me 2 months.Scribed for Iverson Alamin, MD by Particia Lather, medical scribe October 20, 2021The documentation recorded by the scribe accurately reflects the services I personally performed and the decisions made by me. I reviewed and confirmed all material entered and/or pre-charted by the scribe.

## 2020-02-23 ENCOUNTER — Telehealth: Admit: 2020-02-23 | Payer: PRIVATE HEALTH INSURANCE | Primary: Internal Medicine

## 2020-02-23 NOTE — Telephone Encounter
PHARMACY NOTE: INR REMINDER MISSED CALL 	I have left a voicemail for Ms. [name] on 02/23/2020 at 2:51 PM with a reminder that patient is due for an INR lab test based on the referral for anticoagulation monitoring. This is the first week of missed calls. I have also notified the ambulatory care pharmacist   as appropriate.[name], CPHTMedication History Technician ll- Ambulatory Care TechnicianPharmacy Department10/29/20212:51 PM

## 2020-02-26 ENCOUNTER — Telehealth: Admit: 2020-02-26 | Payer: PRIVATE HEALTH INSURANCE | Attending: Surgery of the Hand | Primary: Internal Medicine

## 2020-02-26 NOTE — Telephone Encounter
[facility] MESSAGETime of call:   3:34 PMCaller:   Gertha Reason for call:  Hello, patient f/u w/Dr. Bufford Spikes and her sx is 04/09/20. She would like to speak with someone from [facility]'s office regarding hernia repair. Please advise patient. Thank you, GreerBest telephone number for callback:  [phone] Referral Specialist

## 2020-02-27 NOTE — Telephone Encounter
I spoke with patient who is concerned her surgery will be performed by Dr. [name] alone and she is requesting that Dr. Charlesetta Shanks is involved. She has not yet seen Dr. [name], but she should discuss with him at the time of her appointment. Per Dr. Josem Kaufmann notes he will be there.

## 2020-03-01 ENCOUNTER — Telehealth: Admit: 2020-03-01 | Payer: PRIVATE HEALTH INSURANCE | Primary: Internal Medicine

## 2020-03-01 NOTE — Telephone Encounter
PHARMACY NOTE: INR REMINDER MISSED CALL 	I have left a voicemail for [name] on 03/01/2020 at 3:39 PM with a reminder that patient is due for an INR lab test based on the referral for anticoagulation monitoring. This is the second week of missed calls. I have also notified the ambulatory care pharmacist   as appropriate.[name], CPHTMedication History Technician ll- Ambulatory Care TechnicianPharmacy Department11/5/20213:39 PM

## 2020-03-11 ENCOUNTER — Encounter: Admit: 2020-03-11 | Payer: PRIVATE HEALTH INSURANCE | Attending: Medical Oncology | Primary: Internal Medicine

## 2020-03-11 ENCOUNTER — Ambulatory Visit: Admit: 2020-03-11 | Payer: PRIVATE HEALTH INSURANCE | Attending: Medical Oncology | Primary: Internal Medicine

## 2020-03-11 ENCOUNTER — Ambulatory Visit
Admit: 2020-03-11 | Payer: PRIVATE HEALTH INSURANCE | Attending: Pharmacist Clinician (PhC)/ Clinical Pharmacy Specialist | Primary: Internal Medicine

## 2020-03-11 ENCOUNTER — Inpatient Hospital Stay: Admit: 2020-03-11 | Discharge: 2020-03-11 | Payer: PRIVATE HEALTH INSURANCE | Primary: Internal Medicine

## 2020-03-11 DIAGNOSIS — Z7901 Long term (current) use of anticoagulants: Secondary | ICD-10-CM

## 2020-03-11 DIAGNOSIS — D6862 Lupus anticoagulant syndrome: Secondary | ICD-10-CM

## 2020-03-11 DIAGNOSIS — K559 Vascular disorder of intestine, unspecified: Secondary | ICD-10-CM

## 2020-03-11 DIAGNOSIS — Z5181 Encounter for therapeutic drug level monitoring: Secondary | ICD-10-CM

## 2020-03-11 LAB — PROTIME AND INR
BKR INR: 1.48 — ABNORMAL HIGH (ref 0.91–1.05)
BKR PROTHROMBIN TIME: 15.5 s — ABNORMAL HIGH (ref 10.0–11.6)

## 2020-03-18 ENCOUNTER — Inpatient Hospital Stay: Admit: 2020-03-18 | Discharge: 2020-03-18 | Payer: PRIVATE HEALTH INSURANCE | Primary: Internal Medicine

## 2020-03-18 DIAGNOSIS — D6862 Lupus anticoagulant syndrome: Secondary | ICD-10-CM

## 2020-03-18 DIAGNOSIS — Z5181 Encounter for therapeutic drug level monitoring: Secondary | ICD-10-CM

## 2020-03-18 DIAGNOSIS — Z7901 Long term (current) use of anticoagulants: Secondary | ICD-10-CM

## 2020-03-18 LAB — PROTIME AND INR
BKR INR: 3.06 — ABNORMAL HIGH (ref 0.91–1.05)
BKR PROTHROMBIN TIME: 30.7 seconds — ABNORMAL HIGH (ref 10.0–11.6)
BKR WAM ABSOLUTE LYMPHOCYTE COUNT.: 3.06 x 1000/??L — ABNORMAL HIGH (ref 0.91–1.05)

## 2020-03-19 ENCOUNTER — Ambulatory Visit: Admit: 2020-03-19 | Payer: PRIVATE HEALTH INSURANCE | Attending: Pharmacotherapy | Primary: Internal Medicine

## 2020-03-19 ENCOUNTER — Encounter: Admit: 2020-03-19 | Payer: PRIVATE HEALTH INSURANCE | Attending: Pharmacotherapy | Primary: Internal Medicine

## 2020-03-19 DIAGNOSIS — L719 Rosacea, unspecified: Secondary | ICD-10-CM

## 2020-03-19 DIAGNOSIS — D6862 Lupus anticoagulant syndrome: Secondary | ICD-10-CM

## 2020-03-19 DIAGNOSIS — F32A Depression: Secondary | ICD-10-CM

## 2020-03-19 DIAGNOSIS — R42 Dizziness and giddiness: Secondary | ICD-10-CM

## 2020-03-19 DIAGNOSIS — R112 Nausea with vomiting, unspecified: Secondary | ICD-10-CM

## 2020-03-19 DIAGNOSIS — E119 Type 2 diabetes mellitus without complications: Secondary | ICD-10-CM

## 2020-03-19 DIAGNOSIS — Z98811 Dental restoration status: Secondary | ICD-10-CM

## 2020-03-19 DIAGNOSIS — D219 Benign neoplasm of connective and other soft tissue, unspecified: Secondary | ICD-10-CM

## 2020-03-19 DIAGNOSIS — E7800 Pure hypercholesterolemia, unspecified: Secondary | ICD-10-CM

## 2020-03-19 DIAGNOSIS — K55039 Acute (reversible) ischemia of large intestine, extent unspecified: Secondary | ICD-10-CM

## 2020-03-19 DIAGNOSIS — K436 Other and unspecified ventral hernia with obstruction, without gangrene: Secondary | ICD-10-CM

## 2020-03-19 DIAGNOSIS — K219 Gastro-esophageal reflux disease without esophagitis: Secondary | ICD-10-CM

## 2020-03-19 DIAGNOSIS — K559 Vascular disorder of intestine, unspecified: Secondary | ICD-10-CM

## 2020-03-19 NOTE — Progress Notes
Pharmacist Anticoagulation Clinic Telephone Visit Follow Maria [name] ([birth year]) is on chronic anticoagulation for the diagnosis of  Lupus anticoagulant with hypercoagulable state (HC Code) (HC CODE)  (primary encounter diagnosis) Ischemic colitis (HC Code) (HC CODE) with an INR goal of 2.0-3.0.  The patient was contacted regarding lab drawn INR results.  Patient's INR findings and prescribed dose from last visit:Anticoagulation Monitoring Latest Ref Rng & Units 02/06/2020 03/11/2020 03/19/2020 INR 2.0 - 3.0 - - - INR 0.91 - 1.10 - - - INR (External) 0.80 - 1.15 - - - Assoc. INR Date - 02/06/2020 03/11/2020 - Associated INR - 4.34 1.48 - Instructions - 10/12: Hold; Otherwise 2.5 mg every Wed, Fri; 5 mg all other days 11/15: 7.5 mg; Otherwise 5 mg every day 5 mg every day Last 7 day dose total - 32.5 mg 32.5 mg 35 mg Next INR Date - 02/12/2020 03/18/2020 03/25/2020 The following findings were reported from today's call (no findings if none selected):[]  Missed doses []  Extra doses [x]  Change in medications []  Change in vitamin k rich food intake (green vegetables, herbals, etc) []  Change in alcohol use []  Change in overall health []  Recent hospitalization / emergency department visit [x]  Upcoming procedure (including dental procedures) []  Other Comments:       Patient states she took 7.5mg  as directed on 03/11/20 and 2.5mg   (instead of intended 5 mg) on Wednesday (03/13/20). She also says she started taking vitamin K Ashby Dawes Made) yesterday, 03/18/20. She has an upcoming surgical procedure scheduled for 04/09/20. The following side effects were reported from today's call (no side effects if none selected):[]  Bleeding []  New bruising []  Warfarin-emergency department visit or hospital admission []  Other Comments:   No side effects Assessment/Plan:Patient's INR is 3.06, which is above goal range of 2.0-3.0.  She was instructed to continue current weekly dose (5mg  daily) as indicated in the maintenance plan below.  Next INR assessment due 1 week.   Detailed bridging plans for upcoming procedure (to start 04/04/20) to be reviewed with patient at future anticoagulation encounter. Anticoagulation Summary  As of 03/19/2020  INR goal:  2.0-3.0 TTR:  46.1 % (5.8 y) INR used for dosing:   Warfarin maintenance plan:  5 mg (5 mg x 1) every day Weekly warfarin total:  35 mg Plan last modified:  [name], PharmD (03/11/2020) Next INR check:  03/25/2020 Priority:  High Priority Target end date:  Indefinite  Indications  Lupus anticoagulant with hypercoagulable state (HC Code) (HC CODE) [D68.62]Ischemic colitis (HC Code) (HC CODE) [K55.9]   Anticoagulation Episode Summary   INR check location:    Preferred lab:    Send INR reminders to:  [facility] [place] INR POOL  Comments:    Anticoagulation Care Providers   Provider Role Specialty Phone number  [name], MD Referring Medical Oncology [phone]  [place], [name], APRN Responsible Medical Oncology [phone]  All patient's medications have been reviewed and updated as needed.  Electronically Signed by Markus Daft, RPH, November 23, 2021[facility]  [address] [place], [place] [address]  [phone]  Fax: [phone]

## 2020-03-26 ENCOUNTER — Inpatient Hospital Stay: Admit: 2020-03-26 | Discharge: 2020-03-26 | Payer: PRIVATE HEALTH INSURANCE | Primary: Internal Medicine

## 2020-03-26 ENCOUNTER — Encounter: Admit: 2020-03-26 | Payer: PRIVATE HEALTH INSURANCE | Attending: Adult Health | Primary: Internal Medicine

## 2020-03-26 ENCOUNTER — Encounter: Admit: 2020-03-26 | Payer: PRIVATE HEALTH INSURANCE | Attending: Pharmacotherapy | Primary: Internal Medicine

## 2020-03-26 ENCOUNTER — Ambulatory Visit: Admit: 2020-03-26 | Payer: PRIVATE HEALTH INSURANCE | Attending: Pharmacotherapy | Primary: Internal Medicine

## 2020-03-26 DIAGNOSIS — K55039 Acute (reversible) ischemia of large intestine, extent unspecified: Secondary | ICD-10-CM

## 2020-03-26 DIAGNOSIS — D219 Benign neoplasm of connective and other soft tissue, unspecified: Secondary | ICD-10-CM

## 2020-03-26 DIAGNOSIS — L719 Rosacea, unspecified: Secondary | ICD-10-CM

## 2020-03-26 DIAGNOSIS — Z01818 Encounter for other preprocedural examination: Secondary | ICD-10-CM

## 2020-03-26 DIAGNOSIS — K219 Gastro-esophageal reflux disease without esophagitis: Secondary | ICD-10-CM

## 2020-03-26 DIAGNOSIS — R42 Dizziness and giddiness: Secondary | ICD-10-CM

## 2020-03-26 DIAGNOSIS — Z5181 Encounter for therapeutic drug level monitoring: Secondary | ICD-10-CM

## 2020-03-26 DIAGNOSIS — R112 Nausea with vomiting, unspecified: Secondary | ICD-10-CM

## 2020-03-26 DIAGNOSIS — K559 Vascular disorder of intestine, unspecified: Secondary | ICD-10-CM

## 2020-03-26 DIAGNOSIS — F32A Depression: Secondary | ICD-10-CM

## 2020-03-26 DIAGNOSIS — D6862 Lupus anticoagulant syndrome: Secondary | ICD-10-CM

## 2020-03-26 DIAGNOSIS — Z7901 Long term (current) use of anticoagulants: Secondary | ICD-10-CM

## 2020-03-26 DIAGNOSIS — Z98811 Dental restoration status: Secondary | ICD-10-CM

## 2020-03-26 DIAGNOSIS — E7800 Pure hypercholesterolemia, unspecified: Secondary | ICD-10-CM

## 2020-03-26 DIAGNOSIS — K436 Other and unspecified ventral hernia with obstruction, without gangrene: Secondary | ICD-10-CM

## 2020-03-26 DIAGNOSIS — E119 Type 2 diabetes mellitus without complications: Secondary | ICD-10-CM

## 2020-03-26 LAB — PROTIME AND INR
BKR INR: 2.18 — ABNORMAL HIGH (ref 0.91–1.05)
BKR PROTHROMBIN TIME: 22.4 s — ABNORMAL HIGH (ref 10.0–11.6)

## 2020-03-26 NOTE — Progress Notes
Pharmacist Anticoagulation Clinic Telephone Visit Follow Maria [name] (1951/04/11) is on chronic anticoagulation for the diagnosis of  Lupus anticoagulant with hypercoagulable state (HC Code) (HC CODE)  (primary encounter diagnosis) Ischemic colitis (HC Code) (HC CODE) with an INR goal of 2.0-3.0.  The patient was contacted regarding lab drawn INR results.  Patient's INR findings and prescribed dose from last visit:Anticoagulation Monitoring Latest Ref Rng & Units 03/11/2020 03/19/2020 03/26/2020 INR 2.0 - 3.0 - - - INR 0.91 - 1.10 - - - INR (External) 0.80 - 1.15 - - - Assoc. INR Date - 03/11/2020 03/18/2020 03/26/2020 Associated INR - 1.48 3.06 2.18 Instructions - 11/15: 7.5 mg; Otherwise 5 mg every day 5 mg every day 5 mg every day Last 7 day dose total - 32.5 mg 32.5 mg 35 mg Next INR Date - 03/18/2020 03/25/2020 04/02/2020 The following findings were reported from today's call (no findings if none selected):[]  Missed doses []  Extra doses []  Change in medications []  Change in vitamin k rich food intake (green vegetables, herbals, etc) []  Change in alcohol use []  Change in overall health []  Recent hospitalization / emergency department visit []  Upcoming procedure (including dental procedures) []  Other Comments:       Unable to assess.  The following side effects were reported from today's call (no side effects if none selected):[]  Bleeding []  New bruising []  Warfarin-emergency department visit or hospital admission []  Other Comments:   Unable to assess. Assessment/Plan:Patient's INR is 2.18, which is within goal range of 2.0-3.0. Patient's chart was reviewed and voice message was left instructing patient to call the clinic if there have been any changes to prescription or over the counter medications or diet, if experiencing a current illness, if there are any planned upcoming invasive/dental procedures or any other changes that could affect warfarin therapy.  She was instructed to continue current weekly dose as indicated in the maintenance plan below.  Next INR assessment due 1 week.   Anticoagulation Summary  As of 03/26/2020  INR goal:  2.0-3.0 TTR:  46.2 % (5.8 y) INR used for dosing:  2.18 (03/26/2020) Warfarin maintenance plan:  5 mg (5 mg x 1) every day Weekly warfarin total:  35 mg Plan last modified:  [name], PharmD (03/11/2020) Next INR check:  04/02/2020 Priority:  High Priority Target end date:  Indefinite  Indications  Lupus anticoagulant with hypercoagulable state (HC Code) (HC CODE) [D68.62]Ischemic colitis (HC Code) (HC CODE) [K55.9]   Anticoagulation Episode Summary   INR check location:    Preferred lab:    Send INR reminders to:  [facility] [place] INR POOL  Comments:    Anticoagulation Care Providers   Provider Role Specialty Phone number  [name], MD Referring Medical Oncology [phone]  [place], [name], APRN Responsible Medical Oncology [phone]  All patient's medications have been reviewed and updated as needed.  Electronically Signed by Markus Daft, RPH, November 30, 2021[facility]  [address] [place], [place] [address]  Phone:[phone]  Fax: [phone]

## 2020-03-29 ENCOUNTER — Telehealth
Admit: 2020-03-29 | Payer: PRIVATE HEALTH INSURANCE | Attending: Pharmacist Clinician (PhC)/ Clinical Pharmacy Specialist | Primary: Internal Medicine

## 2020-03-29 ENCOUNTER — Ambulatory Visit
Admit: 2020-03-29 | Payer: PRIVATE HEALTH INSURANCE | Attending: Pharmacist Clinician (PhC)/ Clinical Pharmacy Specialist | Primary: Internal Medicine

## 2020-03-29 DIAGNOSIS — K559 Vascular disorder of intestine, unspecified: Secondary | ICD-10-CM

## 2020-03-29 DIAGNOSIS — D6862 Lupus anticoagulant syndrome: Secondary | ICD-10-CM

## 2020-03-29 MED ORDER — ENOXAPARIN 100 MG/ML SUBCUTANEOUS SYRINGE
100 mg/mL | Freq: Every day | SUBCUTANEOUS | 2 refills | Status: SS
Start: 2020-03-29 — End: 2020-04-05

## 2020-03-29 NOTE — Telephone Encounter
As discussed with [place], APRN, will plan for patient to come off of warfarin for 5 full days prior to procedure, and bridge with Lovenox 1.5 mg/kg daily x 4 days prior to procedure, last dose 24 hours before procedure. Please see most recent anticoag encounter for detailed plan.

## 2020-03-30 ENCOUNTER — Inpatient Hospital Stay: Admit: 2020-03-30 | Payer: PRIVATE HEALTH INSURANCE

## 2020-03-30 ENCOUNTER — Emergency Department: Admit: 2020-03-30 | Payer: PRIVATE HEALTH INSURANCE | Primary: Internal Medicine

## 2020-03-30 ENCOUNTER — Inpatient Hospital Stay
Admit: 2020-03-30 | Discharge: 2020-04-05 | Payer: PRIVATE HEALTH INSURANCE | Source: Home / Self Care | Attending: Surgery | Admitting: Surgery

## 2020-03-30 DIAGNOSIS — K56609 Unspecified intestinal obstruction, unspecified as to partial versus complete obstruction: Secondary | ICD-10-CM

## 2020-03-30 LAB — CBC WITH AUTO DIFFERENTIAL
BKR A/G RATIO: 0.74 x 1000/??L (ref 0.00–1.00)
BKR CALCIUM: 81.9 % — ABNORMAL HIGH (ref 39.0–72.0)
BKR WAM ABSOLUTE IMMATURE GRANULOCYTES.: 0.06 x 1000/ÂµL (ref 0.00–0.30)
BKR WAM ABSOLUTE IMMATURE GRANULOCYTES.: 0.07 x 1000/??L (ref 0.00–0.30)
BKR WAM ABSOLUTE LYMPHOCYTE COUNT.: 1.74 x 1000/??L (ref 0.60–3.70)
BKR WAM ABSOLUTE LYMPHOCYTE COUNT.: 1.79 x 1000/??L (ref 0.60–3.70)
BKR WAM ABSOLUTE NRBC (2 DEC): 0 x 1000/??L (ref 0.00–1.00)
BKR WAM ABSOLUTE NRBC (2 DEC): 0 x 1000/??L (ref 0.00–1.00)
BKR WAM ANALYZER ANC: 12.42 x 1000/??L — ABNORMAL HIGH (ref 2.00–7.60)
BKR WAM ANALYZER ANC: 14.91 x 1000/??L — ABNORMAL HIGH (ref 2.00–7.60)
BKR WAM BASOPHIL ABSOLUTE COUNT.: 0.06 x 1000/??L (ref 0.00–1.00)
BKR WAM BASOPHIL ABSOLUTE COUNT.: 0.05 x 1000/??L (ref 0.00–1.00)
BKR WAM BASOPHILS: 0.4 % (ref 0.0–1.4)
BKR WAM BASOPHILS: 0.3 % (ref 0.0–1.4)
BKR WAM EOSINOPHIL ABSOLUTE COUNT.: 0.13 x 1000/ÂµL (ref 0.00–1.00)
BKR WAM EOSINOPHIL ABSOLUTE COUNT.: 0.05 x 1000/??L (ref 0.00–1.00)
BKR WAM EOSINOPHILS: 0.9 % (ref 0.0–5.0)
BKR WAM EOSINOPHILS: 0.3 % (ref 0.0–5.0)
BKR WAM HEMATOCRIT (2 DEC): 44.9 % (ref 35.00–45.00)
BKR WAM HEMATOCRIT (2 DEC): 44.5 % (ref 35.00–45.00)
BKR WAM HEMOGLOBIN: 14.8 g/dL (ref 11.7–15.5)
BKR WAM HEMOGLOBIN: 13.9 g/dL (ref 11.7–15.5)
BKR WAM IMMATURE GRANULOCYTES: 0.4 % (ref 0.0–1.0)
BKR WAM IMMATURE GRANULOCYTES: 0.4 % (ref 0.0–1.0)
BKR WAM LYMPHOCYTES: 11.5 % — ABNORMAL LOW (ref 17.0–50.0)
BKR WAM LYMPHOCYTES: 10.2 % — ABNORMAL LOW (ref 17.0–50.0)
BKR WAM MCH (PG): 26.8 pg — ABNORMAL LOW (ref 27.0–33.0)
BKR WAM MCH (PG): 26 pg — ABNORMAL LOW (ref 27.0–33.0)
BKR WAM MCHC: 33 g/dL (ref 31.0–36.0)
BKR WAM MCHC: 31.2 g/dL (ref 31.0–36.0)
BKR WAM MCV: 81.3 fL (ref 80.0–100.0)
BKR WAM MCV: 83.2 fL (ref 80.0–100.0)
BKR WAM MONOCYTE ABSOLUTE COUNT.: 0.74 x 1000/ÂµL (ref 0.00–1.00)
BKR WAM MONOCYTE ABSOLUTE COUNT.: 0.72 x 1000/??L (ref 0.00–1.00)
BKR WAM MONOCYTES: 4.9 % (ref 4.0–12.0)
BKR WAM MONOCYTES: 4.1 % (ref 4.0–12.0)
BKR WAM MPV: 11.7 fL (ref 8.0–12.0)
BKR WAM MPV: 11.4 fL (ref 8.0–12.0)
BKR WAM NEUTROPHILS: 81.9 % — ABNORMAL HIGH (ref 39.0–72.0)
BKR WAM NEUTROPHILS: 84.7 % — ABNORMAL HIGH (ref 39.0–72.0)
BKR WAM NUCLEATED RED BLOOD CELLS: 0 % (ref 0.0–1.0)
BKR WAM NUCLEATED RED BLOOD CELLS: 0 % (ref 0.0–1.0)
BKR WAM PLATELETS: 395 x1000/ÂµL (ref 150–420)
BKR WAM PLATELETS: 396 x1000/??L (ref 150–420)
BKR WAM RDW-CV: 16 % — ABNORMAL HIGH (ref 11.0–15.0)
BKR WAM RED BLOOD CELL COUNT.: 5.52 M/??L (ref 4.00–6.00)
BKR WAM RED BLOOD CELL COUNT.: 5.35 M/??L (ref 4.00–6.00)
BKR WAM WHITE BLOOD CELL COUNT: 15.2 x1000/??L — ABNORMAL HIGH (ref 4.0–11.0)
BKR WAM WHITE BLOOD CELL COUNT: 17.6 x1000/??L — ABNORMAL HIGH (ref 4.0–11.0)

## 2020-03-30 LAB — PT/INR AND PTT (BH GH L LMW YH)
BKR INR: 1.75 — ABNORMAL HIGH (ref 0.87–1.14)
BKR PARTIAL THROMBOPLASTIN TIME: 34.6 seconds — ABNORMAL HIGH (ref 23.0–31.4)
BKR PROTHROMBIN TIME: 18 seconds — ABNORMAL HIGH (ref 9.6–12.3)

## 2020-03-30 LAB — COMPREHENSIVE METABOLIC PANEL
BKR ALANINE AMINOTRANSFERASE (ALT): 20 U/L (ref 10–35)
BKR ALBUMIN: 4.2 g/dL (ref 3.6–4.9)
BKR ALKALINE PHOSPHATASE: 141 U/L — ABNORMAL HIGH (ref 9–122)
BKR ANION GAP: 12 g/dL (ref 7–17)
BKR ASPARTATE AMINOTRANSFERASE (AST): 26 U/L (ref 10–35)
BKR AST/ALT RATIO: 1.3 x 1000/??L (ref 0.00–1.00)
BKR BILIRUBIN TOTAL: 0.2 mg/dL (ref <=1.2)
BKR BLOOD UREA NITROGEN: 21 mg/dL (ref 8–23)
BKR BUN / CREAT RATIO: 26.6 % — ABNORMAL HIGH (ref 8.0–23.0)
BKR CHLORIDE: 99 mmol/L (ref 98–107)
BKR CO2: 26 mmol/L (ref 20–30)
BKR CREATININE: 0.79 mg/dL (ref 0.40–1.30)
BKR EGFR (AFR AMER): >60 mL/min/{1.73_m2} (ref >60)
BKR EGFR (NON AFRICAN AMERICAN): >60 mL/min/1.73m2 (ref >60)
BKR GLOBULIN: 3.3 g/dL (ref 2.3–3.5)
BKR GLUCOSE: 224 mg/dL — ABNORMAL HIGH (ref 70–100)
BKR POTASSIUM: 4.6 mmol/L (ref 3.3–5.3)
BKR PROTEIN TOTAL: 7.5 g/dL (ref 6.6–8.7)
BKR SODIUM: 137 mmol/L (ref 136–144)
BKR WAM RDW-CV: 224 mg/dL — ABNORMAL HIGH (ref 70–100)

## 2020-03-30 LAB — SARS COV-2 (COVID-19) RNA-~~LOC~~ LABS (BH GH LMW YH): BKR SARS-COV-2 RNA (COVID-19) (YH): NEGATIVE U/L (ref 10–35)

## 2020-03-30 MED ORDER — DEXTROSE 50 % IN WATER (D50W) INTRAVENOUS SYRINGE
INTRAVENOUS | Status: DC | PRN
Start: 2020-03-30 — End: 2020-04-05

## 2020-03-30 MED ORDER — HYDROMORPHONE 2 MG/ML INJECTION SOLUTION
2 mg/mL | Freq: Once | INTRAVENOUS | Status: CP
Start: 2020-03-30 — End: ?
  Administered 2020-03-30: 09:00:00 2 mL via INTRAVENOUS

## 2020-03-30 MED ORDER — FRUIT JUICE
ORAL | Status: DC | PRN
Start: 2020-03-30 — End: 2020-04-05

## 2020-03-30 MED ORDER — HYDROMORPHONE 2 MG/ML INJECTION SOLUTION
2 mg/mL | INTRAVENOUS | Status: DC | PRN
Start: 2020-03-30 — End: 2020-04-01

## 2020-03-30 MED ORDER — DEXTROSE 40 % ORAL GEL
40 % | ORAL | Status: DC | PRN
Start: 2020-03-30 — End: 2020-04-05

## 2020-03-30 MED ORDER — SKIM MILK
ORAL | Status: DC | PRN
Start: 2020-03-30 — End: 2020-04-05

## 2020-03-30 MED ORDER — SODIUM CHLORIDE 0.9 % (FLUSH) INJECTION SYRINGE
0.9 % | INTRAVENOUS | Status: DC | PRN
Start: 2020-03-30 — End: 2020-04-06

## 2020-03-30 MED ORDER — PHENOL 1.4 % MUCOSAL AEROSOL SPRAY
1.4 % | OROMUCOSAL | Status: DC | PRN
Start: 2020-03-30 — End: 2020-04-06
  Administered 2020-03-31 – 2020-04-04 (×3): 1.4 mL via OROMUCOSAL

## 2020-03-30 MED ORDER — INSULIN U-100 REGULAR HUMAN 100 UNIT/ML (SLIDING SCALE)
100 unit/mL | Freq: Four times a day (QID) | SUBCUTANEOUS | Status: DC
Start: 2020-03-30 — End: 2020-04-06
  Administered 2020-04-01: 04:00:00 100 mL via SUBCUTANEOUS

## 2020-03-30 MED ORDER — HYDROMORPHONE 2 MG/ML INJECTION SOLUTION
2 mg/mL | Freq: Four times a day (QID) | INTRAVENOUS | Status: DC | PRN
Start: 2020-03-30 — End: 2020-03-30
  Administered 2020-03-30: 14:00:00 2 mL via INTRAVENOUS

## 2020-03-30 MED ORDER — LACTATED RINGERS INTRAVENOUS SOLUTION
INTRAVENOUS | Status: DC
Start: 2020-03-30 — End: 2020-04-04
  Administered 2020-03-30 – 2020-04-04 (×11): 1000.000 mL/h via INTRAVENOUS

## 2020-03-30 MED ORDER — ONDANSETRON HCL (PF) 4 MG/2 ML INJECTION SOLUTION
4 mg/2 mL | Freq: Four times a day (QID) | INTRAVENOUS | Status: DC | PRN
Start: 2020-03-30 — End: 2020-04-06
  Administered 2020-03-30 – 2020-04-05 (×3): 4 mL via INTRAVENOUS

## 2020-03-30 MED ORDER — HYDROMORPHONE 2 MG/ML INJECTION SOLUTION
2 mg/mL | Freq: Four times a day (QID) | INTRAVENOUS | Status: DC | PRN
Start: 2020-03-30 — End: 2020-03-30

## 2020-03-30 MED ORDER — SODIUM CHLORIDE 0.9 % BOLUS (NEW BAG)
0.9 % | Freq: Once | INTRAVENOUS | Status: CP
Start: 2020-03-30 — End: ?
  Administered 2020-03-30: 09:00:00 0.9 mL/h via INTRAVENOUS

## 2020-03-30 MED ORDER — SODIUM CHLORIDE 0.9 % (FLUSH) INJECTION SYRINGE
0.9 % | Freq: Three times a day (TID) | INTRAVENOUS | Status: DC
Start: 2020-03-30 — End: 2020-04-06
  Administered 2020-03-31 – 2020-04-05 (×6): 0.9 mL via INTRAVENOUS

## 2020-03-30 MED ORDER — HYDROMORPHONE 2 MG/ML INJECTION SOLUTION
2 mg/mL | INTRAVENOUS | Status: DC | PRN
Start: 2020-03-30 — End: 2020-04-01
  Administered 2020-03-30 – 2020-04-01 (×6): 2 mL via INTRAVENOUS

## 2020-03-30 MED ORDER — HYDROMORPHONE 2 MG/ML INJECTION SOLUTION
2 mg/mL | Freq: Once | INTRAVENOUS | Status: CP
Start: 2020-03-30 — End: ?
  Administered 2020-03-30: 11:00:00 2 mL via INTRAVENOUS

## 2020-03-30 MED ORDER — SODIUM CHLORIDE 0.9 % BOLUS (NEW BAG)
0.9 % | Freq: Once | INTRAVENOUS | Status: CP | PRN
Start: 2020-03-30 — End: ?
  Administered 2020-03-30: 11:00:00 0.9 mL/h via INTRAVENOUS

## 2020-03-30 MED ORDER — WARFARIN 5 MG TABLET
5 mg | Freq: Once | ORAL | Status: CP
Start: 2020-03-30 — End: ?
  Administered 2020-03-30: 5 mg via ORAL

## 2020-03-30 MED ORDER — IOHEXOL 350 MG IODINE/ML INTRAVENOUS SOLUTION
350 mg iodine/mL | Freq: Once | INTRAVENOUS | Status: CP | PRN
Start: 2020-03-30 — End: ?
  Administered 2020-03-30: 11:00:00 350 mL via INTRAVENOUS

## 2020-03-30 MED ORDER — ENOXAPARIN 100 MG/ML SUBCUTANEOUS SYRINGE
100 mg/mL | Freq: Every day | SUBCUTANEOUS | Status: DC
Start: 2020-03-30 — End: 2020-04-06
  Administered 2020-03-30 – 2020-04-05 (×7): 100 mL via SUBCUTANEOUS

## 2020-03-30 MED ORDER — GLUCAGON 1 MG/ML IN STERILE WATER
Freq: Once | INTRAMUSCULAR | Status: DC | PRN
Start: 2020-03-30 — End: 2020-04-06

## 2020-03-30 MED ORDER — ONDANSETRON HCL (PF) 4 MG/2 ML INJECTION SOLUTION
4 mg/2 mL | Freq: Once | INTRAVENOUS | Status: CP
Start: 2020-03-30 — End: ?
  Administered 2020-03-30: 09:00:00 4 mL via INTRAVENOUS

## 2020-03-30 MED ORDER — DEXTROSE 50 % IN WATER (D50W) INTRAVENOUS SYRINGE
INTRAVENOUS | Status: DC | PRN
Start: 2020-03-30 — End: 2020-04-05
  Administered 2020-04-03: 13:00:00 50.000 mL via INTRAVENOUS

## 2020-03-30 MED ORDER — HYDROMORPHONE 2 MG/ML INJECTION SOLUTION
2 mg/mL | Freq: Once | INTRAVENOUS | Status: DC
Start: 2020-03-30 — End: 2020-03-30

## 2020-03-30 MED ORDER — WARFARIN THERAPY PLACEHOLDER
ORAL | Status: DC
Start: 2020-03-30 — End: 2020-03-30

## 2020-03-30 MED ORDER — FAMOTIDINE 4 MG/ML IN 0.9% SODIUM CHLORIDE (ADULT)
Freq: Two times a day (BID) | INTRAVENOUS | Status: DC
Start: 2020-03-30 — End: 2020-04-05
  Administered 2020-03-30 – 2020-04-05 (×13): 5.000 mL via INTRAVENOUS

## 2020-03-30 NOTE — H&P
GENERAL SURGERY HISTORY AND PHYSICALConsultation Information: Consultation requested by:  Emergency DepartmentConsulting attending: Dr. Bernell List for Consultation/Chief Complaint: high grade SBO Presentation History: HPI:  Maria [name] is a 69 y.o. female with h/o multiple prior abdominal surgeries including ex-lap sigmoidectomy and hartmann's procedure in 2015, now reversed. The patient's post-operative course has been unfortunately c/b multiple prior SBO necessitating ex-lap and LOA in 2017. The patient presents today w/ a 1 day h/o post-prandial abdominal pain, which worsened overnight. Denies any N/V. Last BM 11pm w/ flatus overnight. In the ED, AF and hemodynamically appropriate, w/  WBC 15.2. [place]ouss[place] abd demonstrates high grade SBO w/ transition point in anterior abdomen. Medical History: PMH PSH Past Medical History: Diagnosis Date ? Acute ischemic colitis (HC Code) (HC CODE) 2015 ? Dental crown present   UPPER FRONT CAP ? Depression   Receving treatment at [facility] ? Diabetes mellitus (HC Code) (HC CODE)  ? Fibroid   2003 ? GERD (gastroesophageal reflux disease)   SINCE WT LOSS AND DIET CHANGE PT NO LONGER HAS PROBLEMS WITH GERD ? Hypercholesteremia  ? PONV (postoperative nausea and vomiting)  ? Rosacea  ? Ventral hernia with bowel obstruction  ? Vertigo   Past Surgical History: Procedure Laterality Date ? ABDOMINAL ADHESION SURGERY  2016 ? BLEPHAROPLASTY Bilateral 05/2016 ? COLOSTOMY  05/27/13 ? COLOSTOMY CLOSURE   ? KNEE LIGAMENT RECONSTRUCTION Right 2004 ? LAPAROSCOPIC NISSEN FUNDOPLICATION    2000 ? ? SINUS SURGERY   ? THYROID BIOPSY    NEGATIVE PER PATIENT ? TUBAL LIGATION   ? UMBILICAL HERNIA REPAIR   ? VENTRAL HERNIA REPAIR    Social History Family History Social History Tobacco Use ? Smoking status: Never Smoker ? Smokeless tobacco: Never Used Vaping Use ? Vaping Use: Never used Substance Use Topics ? Alcohol use: Yes   Comment: once or twice a year ? Drug use: No  Family History Problem Relation Age of Onset ? Cancer Mother       bladder cancer ? Bladder cancer Mother  ? Colon cancer Paternal Aunt  ? Colon cancer Cousin       died of colona cancer at age 38 ? Breast cancer Cousin       2 cousins with breast cancer ? Colon cancer Father  ? Cancer Sister   Meds Allergies (Not in a hospital admission) Allergies Allergen Reactions ? Morphine Hives and Itching   Pt tolerated morphine during admission 08/08/16-08/09/16.  Pt premedicated with diphenhydramine ? Biaxin  [Clarithromycin]  ? Latex, Natural Rubber Rash   Review of Systems: Negative except per HPIPhysical Exam:  Vitals: Last 24 hours: Patient Vitals for the past 24 hrs: BP Temp Temp src Pulse Resp SpO2 03/30/20 0642 (!) 140/82 97.2 ?F (36.2 ?C) Temporal 84 18 97 % 03/30/20 0450 (!) 158/75 98.1 ?F (36.7 ?C) -- 87 18 98 % 03/30/20 0323 (!) 154/82 97.4 ?F (36.3 ?C) Oral (!) 93 18 100 % I/O's:Gross Totals (Last 24 hours) at 03/30/2020 0745Last data filed at 03/30/2020 0545Intake 1050 ml Output -- Net 1050 ml Physical Exam GEN: Resting in bed, NADHEENT: NGT in place to LCWSABD: Moderate distention, soft, non-tender to palpation. No g/r/rt Labs: Labs:Last 24 hours: Recent Results (from the past 24 hour(s)) EKG  Collection Time: 03/30/20  3:33 AM Result Value Ref Range  ECG - HEART RATE 85 bpm  ECG - QRS Interval 85 ms  ECG - QT Interval 378 ms  ECG - QTC Interval 449 ms  ECG - P Axis 57 deg  ECG -  QRS Axis 45 deg  ECG - T Wave Axis 54 deg  ECG -- P-R Interval 142 msec  ECG - SEVERITY Normal ECG severity PT/INR and PTT  Collection Time: 03/30/20  3:42 AM Result Value Ref Range  Prothrombin Time 18.0 (H) 9.6 - 12.3 seconds  INR 1.75 (H) 0.87 - 1.14  PTT 34.6 (H) 23.0 - 31.4 seconds CBC auto differential Collection Time: 03/30/20  3:42 AM Result Value Ref Range  WBC 15.2 (H) 4.0 - 11.0 x1000/?L  RBC 5.52 4.00 - 6.00 M/?L  Hemoglobin 14.8 11.7 - 15.5 g/dL  Hematocrit 16.10 96.04 - 45.00 %  MCV 81.3 80.0 - 100.0 fL  MCH 26.8 (L) 27.0 - 33.0 pg  MCHC 33.0 31.0 - 36.0 g/dL  RDW-CV 54.0 (H) 98.1 - 15.0 %  Platelets 395 150 - 420 x1000/?L  MPV 11.7 8.0 - 12.0 fL  Neutrophils 81.9 (H) 39.0 - 72.0 %  Lymphocytes 11.5 (L) 17.0 - 50.0 %  Monocytes 4.9 4.0 - 12.0 %  Eosinophils 0.9 0.0 - 5.0 %  Basophil 0.4 0.0 - 1.4 %  Immature Granulocytes 0.4 0.0 - 1.0 %  nRBC 0.0 0.0 - 1.0 %  ANC(Abs Neutrophil Count) 12.42 (H) 2.00 - 7.60 x 1000/?L  Absolute Lymphocyte Count 1.74 0.60 - 3.70 x 1000/?L  Monocyte Absolute Count 0.74 0.00 - 1.00 x 1000/?L  Eosinophil Absolute Count 0.13 0.00 - 1.00 x 1000/?L  Basophil Absolute Count 0.06 0.00 - 1.00 x 1000/?L  Absolute Immature Granulocyte Count 0.06 0.00 - 0.30 x 1000/?L  Absolute nRBC 0.00 0.00 - 1.00 x 1000/?L Comprehensive metabolic panel  Collection Time: 03/30/20  3:42 AM Result Value Ref Range  Sodium 137 136 - 144 mmol/L  Potassium 4.6 3.3 - 5.3 mmol/L  Chloride 99 98 - 107 mmol/L  CO2 26 20 - 30 mmol/L  Anion Gap 12 7 - 17  Glucose 224 (H) 70 - 100 mg/dL  BUN 21 8 - 23 mg/dL  Creatinine 1.91 4.78 - 1.30 mg/dL  Calcium 9.5 8.8 - 29.5 mg/dL  BUN/Creatinine Ratio 62.1 (H) 8.0 - 23.0  Total Protein 7.5 6.6 - 8.7 g/dL  Albumin 4.2 3.6 - 4.9 g/dL  Total Bilirubin 0.2 <=3.0 mg/dL  Alkaline Phosphatase 865 (H) 9 - 122 U/L  Alanine Aminotransferase (ALT) 20 10 - 35 U/L  Aspartate Aminotransferase (AST) 26 10 - 35 U/L  Globulin 3.3 2.3 - 3.5 g/dL  A/G Ratio 1.3 1.0 - 2.2  AST/ALT Ratio 1.3 See Comment  eGFR (Afr Amer) >60 >60 mL/min/1.31m2  eGFR (NON African-American) >60 >60 mL/min/1.45m2 Radiology: [place] Abdomen Pelvis w IV Contrast (no oral contrast)Result Date: 12/4/2021CT ABDOMEN PELVIS W IV CONTRAST HISTORY: abdominal pain COMPARISON: [place] ABDOMEN PELVIS W IV CONTRAST 2021-Sep-08 TECHNIQUE: [place] images of the abdomen and pelvis were obtained from the diaphragms to the pubic symphysis after the administration of 80 cc intravenous contrast (Omnipaque 350). FINDINGS: LUNG BASES: Bibasilar atelectasis. LIVER: Unremarkable. GALLBLADDER: Unremarkable. SPLEEN: Unremarkable. PANCREAS: Unremarkable. ADRENALS: Unremarkable. KIDNEYS: Unremarkable. BOWEL: Findings of high-grade small bowel obstruction with transition point in the mid anterior abdomen with distally collapsed small bowel (image 358, series 3).  Proximally the small bowel is dilated with air-fluid levels and fecalized contents. There is mild interloop fluid/edema. No intraperitoneal free air is noted. Patient is status post subtotal colectomy. A midline ventral hernia is again seen containing nonobstructed bowel. Nonobstructed large bowel is again seen herniating through a right lateral abdominal defect, likely site of prior ostomy. LYMPH NODES: Unremarkable. VESSELS: There  is sclerotic disease in aorta and major branches. URINARY BLADDER: Unremarkable. PELVIS: Unremarkable. BONES & SOFT TISSUE: Hernia repair mesh anchors are seen in the anterior abdominal wall on the left side. Degenerative changes are seen in the lumbar spine with disc calcification at L2-L3. Scattered lumbar disc herniation/bulge without severe spinal canal stenosis.. High-grade small bowel obstruction with transition point in the mid anterior abdomen. No pneumoperitoneum or conspicuous decrease in mucosal enhancement. Report Initiated By:  Keane Scrape, MD Reported And Signed By: Elray Buba, MD  [facility] Radiology and Biomedical ImagingImpressions: 69 y/o F w/ a h/o multiple prior abdominal surgeries now w/ [place] scan demonstrating high grade small bowel obstruction. Will plan to treat non-operatively at this time w/ NGT decompression, and NPO/IVF. Awaiting return of bowel function. Recommendations/Plan: ?	Admit to Dr. Deniece Portela?	NPO/mIVF?	NGT to LCWS - f/u CXR for placement ?	Pain control?	Warfarin for DVT prophylaxis ?	Await return of bowel function, progress diet per protocol Case to be discussed with Attending Dr. Deniece Portela.Signed:Brogan Logan Bores, MD, PGY-3General Surgery12/04/217:45 AMAttending Statement: I saw and examined the patient with Dr. Logan Bores and I agree with the history, physical assessment and plan. Briefly, 69 yo woman well known to me from mult SBOs devel gen abd pain w/nausea MN last night and came to ER.  No emesis.  Eval in ER w/[place] showed SBO.avssabd soft, mod disten, firmer L side, min tender, no massCT reviewedImp- SBO due to adhesions w/o evidence of strangulationNG has been placed; bowel rest; serial examsConvert anticoag to Lovenox awaiting resolutionSurgery sch for next week for [facility]

## 2020-03-30 NOTE — ED Notes
7:16 AM Report received, care assumed at this time. Pt admitted for SBO. NGT in place connected to LCWS.11:05 AM Pt ambulatory with steady gait to BR. 12:04 PM Pt transported upstairs at this time.

## 2020-03-30 NOTE — ED Provider Notes
HistoryChief Complaint Patient presents with ? Abdominal Pain   mid-epigastric abdominal pain that began approx 3 hours ago. History of SBOs with complex surgical history. LBM last night, small pebbles. +constipation. Scheduled for hernia surgery on 14th.   The history is provided by the patient. Abdominal PainPain location:  GeneralizedPain quality: aching and cramping  Pain radiates to:  Does not radiatePain severity:  SevereOnset quality:  SuddenDuration:  4 hoursTiming:  SporadicProgression:  WorseningChronicity:  RecurrentContext: not diet changes  Relieved by:  NothingWorsened by:  NothingIneffective treatments:  None triedAssociated symptoms: nausea and vomiting  Associated symptoms: no diarrhea   Past Medical History: Diagnosis Date ? Acute ischemic colitis (HC Code) (HC CODE) 2015 ? Dental crown present   UPPER FRONT CAP ? Depression   Receving treatment at [facility] ? Diabetes mellitus (HC Code) (HC CODE)  ? Fibroid   2003 ? GERD (gastroesophageal reflux disease)   SINCE WT LOSS AND DIET CHANGE PT NO LONGER HAS PROBLEMS WITH GERD ? Hypercholesteremia  ? PONV (postoperative nausea and vomiting)  ? Rosacea  ? Ventral hernia with bowel obstruction  ? Vertigo  Past Surgical History: Procedure Laterality Date ? ABDOMINAL ADHESION SURGERY  2016 ? BLEPHAROPLASTY Bilateral 05/2016 ? COLOSTOMY  05/27/13 ? COLOSTOMY CLOSURE   ? KNEE LIGAMENT RECONSTRUCTION Right 2004 ? LAPAROSCOPIC NISSEN FUNDOPLICATION    2000 ? ? SINUS SURGERY   ? THYROID BIOPSY    NEGATIVE PER PATIENT ? TUBAL LIGATION   ? UMBILICAL HERNIA REPAIR   ? VENTRAL HERNIA REPAIR   Family History Problem Relation Age of Onset ? Cancer Mother       bladder cancer ? Bladder cancer Mother  ? Colon cancer Paternal Aunt  ? Colon cancer Cousin       died of colona cancer at age 30 ? Breast cancer Cousin       2 cousins with breast cancer ? Colon cancer Father  ? Cancer Sister  Social History Socioeconomic History ? Marital status: Married   Spouse name: Not on file ? Number of children: Not on file ? Years of education: Not on file ? Highest education level: Not on file Tobacco Use ? Smoking status: Never Smoker ? Smokeless tobacco: Never Used Vaping Use ? Vaping Use: Never used Substance and Sexual Activity ? Alcohol use: Yes   Comment: once or twice a year ? Drug use: No ? Sexual activity: Yes   Partners: Male   Comment: with husband Social History Narrative  10/23/14:  Lives w/ husband and family in a 2 story home w/ 2 sets of 11 stairs w/ 1 rail to enter. She was indep w/ ADLs and amb w/out AD.     ED Other Social History ? E-cigarette status Never User  ? E-Cigarette Use Never User  E-cigarette/Vaping Substances E-cigarette/Vaping Devices Review of Systems Gastrointestinal: Positive for abdominal pain, nausea and vomiting. Negative for diarrhea. All other systems reviewed and are negative. Physical ExamED Triage Vitals [03/30/20 0323]BP: (!) 154/82Pulse: (!) 93Pulse from  O2 sat: n/aResp: 18Temp: 97.4 ?F (36.3 ?C)Temp src: OralSpO2: 100 % BP (!) 158/75  - Pulse 87  - Temp 98.1 ?F (36.7 ?C)  - Resp 18  - SpO2 98% Physical ExamVitals and nursing note reviewed. Constitutional:     General: She is in acute distress.    Appearance: She is not toxic-appearing. HENT:    Head: Normocephalic and atraumatic. Eyes:    General: No scleral icterus.Cardiovascular:    Rate and Rhythm: Normal rate.  Heart sounds: Normal heart sounds. Pulmonary:    Effort: Pulmonary effort is normal.    Breath sounds: Normal breath sounds. Abdominal:    General: Bowel sounds are decreased. There is distension.    Tenderness: There is generalized abdominal tenderness. Skin:   General: Skin is warm and dry. Neurological:    General: No focal deficit present.    Mental Status: She is alert. Psychiatric:       Mood and Affect: Mood normal.  ProceduresProcedures ED COURSEPatient Reevaluation: Attending Supervised: ResidentI saw and examined the patient. I agree with the findings and plan of care as documented in the resident's note. Of note Wynelle Link BonzThis is a 69 year old female with a history of diabetes, lupus anticoagulant on Coumadin, GERD status post Nissen, its ventral hernia status post repair in 2014, ischemic colitis status post ex lap and Hartmann's 2015, status post reversal, multiple SBOs presents with sudden onset of generalized severe abdominal pain similar to prior SBO so.  Patient is quite uncomfortable and actively vomiting.  Abdomen with decreased bowel sounds, soft, and was generalized tenderness.  Of note, patient is scheduled for outpatient upcoming hernia repair. Will obtain labs, provide IV fluid with analgesia and antiemetics, and pursue [place] scan of the abdomen and pelvis.-patient does have bowel obstruction and the mid abdomen-NGT for decompression-surgery has been paged 5:59 AM; case discussed with Dr Deniece Portela, will admit to her serviceClinical Impressions as of Mar 31 615 Small bowel obstruction [facility] Code) [facility] CODE)  ED DispositionAdmit Nicoletta Dress, MD12/04/21 [phone]

## 2020-03-30 NOTE — Utilization Review (ED)
UM Status: Managed Medicare IP SBO-NGT-surgery consult

## 2020-03-30 NOTE — ED Notes
3:29 AM Chief Complaint Patient presents with ? Abdominal Pain   mid-epigastric abdominal pain that began approx 3 hours ago. History of SBOs with complex surgical history. LBM last night, small pebbles. +constipation. Scheduled for hernia surgery on 14th.  epigastric pain without radiation. +Past Surgical History: Procedure Laterality Date ? ABDOMINAL ADHESION SURGERY  2016 ? BLEPHAROPLASTY Bilateral 05/2016 ? COLOSTOMY  05/27/13 ? COLOSTOMY CLOSURE   ? KNEE LIGAMENT RECONSTRUCTION Right 2004 ? LAPAROSCOPIC NISSEN FUNDOPLICATION    2000 ? ? SINUS SURGERY   ? THYROID BIOPSY    NEGATIVE PER PATIENT ? TUBAL LIGATION   ? UMBILICAL HERNIA REPAIR   ? VENTRAL HERNIA REPAIR   pain located in epigastric area.+nausea,+emesis, +flatus. Denies HA,dizziness, fever/chills, CP, SOB. Extensive surgical history. Emesis x2, urinary incontinence while vomiting. EKG done, PIV placed, labs sent. Zofran and Dilaudid given. MD Bonz at bedside for assessment3:51 AM 1 L NS started5:52 AM returned from [place] scan. Ambulated to BR. 1 mg IV dilaudid given for pain 8/106:40 AM BGT placed. 63 cm at Nostril. Put to CLWS per MD Bonz. COVID swab sent

## 2020-03-30 NOTE — ED Notes
7:20 AM Report received. Patient with hx of multiple SBO arrives with acute sudden abdominal pain and vomiting, noted on [place] with another SBO at mid abdomen, placed on NGT with noted output of 200 ml of light brown color, abdomen distended. Patient currently reports pain level 2 out of 10, and no longer vomiting. Awaiting for inpatient bed and disposition. 9:00 AM Surgery provider at bedside.

## 2020-03-31 ENCOUNTER — Encounter
Admit: 2020-03-31 | Payer: PRIVATE HEALTH INSURANCE | Attending: Nurse Practitioner - Neonatal, Critical Care | Primary: Internal Medicine

## 2020-03-31 ENCOUNTER — Inpatient Hospital Stay: Admit: 2020-03-31 | Payer: PRIVATE HEALTH INSURANCE

## 2020-03-31 DIAGNOSIS — K56609 Unspecified intestinal obstruction, unspecified as to partial versus complete obstruction: Secondary | ICD-10-CM

## 2020-03-31 LAB — PT/INR AND PTT (BH GH L LMW YH)
BKR INR: 1.89 — ABNORMAL HIGH (ref 0.87–1.14)
BKR PARTIAL THROMBOPLASTIN TIME: 38.3 seconds — ABNORMAL HIGH (ref 23.0–31.4)
BKR PROTHROMBIN TIME: 19.3 seconds — ABNORMAL HIGH (ref 9.6–12.3)

## 2020-03-31 LAB — BASIC METABOLIC PANEL
BKR ANION GAP: 14 (ref 7–17)
BKR BLOOD UREA NITROGEN: 12 mg/dL (ref 8–23)
BKR BUN / CREAT RATIO: 18.8 (ref 8.0–23.0)
BKR CALCIUM: 8.9 mg/dL (ref 8.8–10.2)
BKR CHLORIDE: 100 mmol/L (ref 98–107)
BKR CO2: 22 mmol/L (ref 20–30)
BKR CREATININE: 0.64 mg/dL (ref 0.40–1.30)
BKR EGFR (AFR AMER): >60 mL/min/{1.73_m2} (ref >60)
BKR EGFR (NON AFRICAN AMERICAN): >60 mL/min/{1.73_m2} (ref >60)
BKR GLUCOSE: 103 mg/dL — ABNORMAL HIGH (ref 70–100)
BKR POTASSIUM: 4 mmol/L (ref 3.3–5.3)
BKR SODIUM: 136 mmol/L (ref 136–144)

## 2020-03-31 LAB — MAGNESIUM: BKR MAGNESIUM: 1.9 mg/dL (ref 1.7–2.4)

## 2020-03-31 LAB — PHOSPHORUS     (BH GH L LMW YH): BKR PHOSPHORUS: 2.2 mg/dL (ref 2.2–4.5)

## 2020-03-31 MED ORDER — DIAZEPAM 5 MG/ML INJECTION SYRINGE
5 mg/mL | INTRAVENOUS | Status: DC | PRN
Start: 2020-03-31 — End: 2020-04-06
  Administered 2020-04-04: 02:00:00 5 mL via INTRAVENOUS

## 2020-03-31 MED ORDER — HYDROMORPHONE 2 MG/ML INJECTION SOLUTION
2 mg/mL | INTRAVENOUS | Status: DC | PRN
Start: 2020-03-31 — End: 2020-04-04
  Administered 2020-04-01 – 2020-04-03 (×9): 2 mL via INTRAVENOUS

## 2020-03-31 MED ORDER — IOHEXOL 350 MG IODINE/ML ORAL
350 mg/mL | Freq: Once | ORAL | Status: DC
Start: 2020-03-31 — End: 2020-04-06

## 2020-03-31 MED ORDER — DIPHENHYDRAMINE 50 MG/ML INJECTION SOLUTION
50 mg/mL | Freq: Four times a day (QID) | INTRAVENOUS | Status: DC | PRN
Start: 2020-03-31 — End: 2020-04-06
  Administered 2020-04-01 – 2020-04-03 (×7): 50 mL via INTRAVENOUS

## 2020-03-31 MED ORDER — HYDROMORPHONE 2 MG/ML INJECTION SOLUTION
2 mg/mL | INTRAVENOUS | Status: DC | PRN
Start: 2020-03-31 — End: 2020-04-04
  Administered 2020-04-02: 02:00:00 2 mL via INTRAVENOUS

## 2020-03-31 MED ORDER — LABETALOL 5 MG/ML INTRAVENOUS SOLUTION
5 mg/mL | INTRAVENOUS | Status: DC | PRN
Start: 2020-03-31 — End: 2020-04-01

## 2020-03-31 MED ORDER — IOHEXOL 350 MG IODINE/ML ORAL
350 mg/mL | Freq: Once | ORAL | Status: CP
Start: 2020-03-31 — End: ?
  Administered 2020-03-31: 14:00:00 350 mL via ORAL

## 2020-03-31 NOTE — Plan of Care
Plan of Care Overview/ Patient Status    Pt came up to floor from ED @1215 . Oriented to room. Pt A&Ox4. MAE 5/5. Afebrile. SBP 150s. HR 80s. SpO2 94% on RA. NPO. Tenderness to abd present. Mild nausea, refused Zofran. Tolerating NGT to low continuous wall suction. Pt reported LBM 12/3. Continent of b/b. 20G L AC PIV patent. LR running @100ml /hr. PRN dilaudid given for pain. Safety maintained. Call bell within reach. See flowsheet for more information. Problem: Adult Inpatient Plan of CareGoal: Plan of Care ReviewOutcome: Interventions implemented as appropriateGoal: Patient-Specific Goal (Individualized)Outcome: Interventions implemented as appropriateGoal: Absence of Hospital-Acquired Illness or InjuryOutcome: Interventions implemented as appropriateGoal: Optimal Comfort and WellbeingOutcome: Interventions implemented as appropriateGoal: Readiness for Transition of CareOutcome: Interventions implemented as appropriate Problem: InfectionGoal: Absence of Infection Signs and SymptomsOutcome: Interventions implemented as appropriate Problem: Fall Injury RiskGoal: Absence of Fall and Fall-Related InjuryOutcome: Interventions implemented as appropriate

## 2020-03-31 NOTE — Plan of Care
Plan of Care Overview/ Patient Status    1900-0700- Pt A&O X 4, VSS, on RA, pain managed with dilaudid. NGT  To LCWS draining light brown output. No N/V present. Lungs clear, no signs of distress. +BS, abdomen tender, no BM on shift. Pt voiding to toilet with assist x 1. Skin is intact. NPO status maintained. LR running at 142ml/hr. Safety & comfort maintained. See flowsheet for details.

## 2020-03-31 NOTE — Plan of Care
Plan of Care Overview/ Patient Status    Assumed care @ 0700, a+o x 4, speech clear, mood pleasant, able to voice needs, bed mobility per self, skin w/d, lscta, no cough, ngt taped to nostril center @ 60 with minimal light brown op, ngt clamped post omnipaque admin, denied any nausea, bs hypoactive x 4 quads, endorses flatus, mild epigastric tenderness noted, abd contour irregular, npo maintained, hr and bp goal met, denied any pain at rest, safety maintained, seem mar and flowsheets for more info

## 2020-03-31 NOTE — Progress Notes
Serial abdominal exam note:03/30/20, approximately 3pm: Patient was examined at bedside for serial abdominal exam. Her abdomen was softly distended, tympanitic, and moderately tender to palpation in her epigastrium, Left more so than right. She was not passing flatus.03/30/20, approximately 9pm: Patient was re-examined at bedside. She endorsed slight worsening of pain, although she had not received any pain medication since 1pm. Her abdominal exam was minimally changed - she remained softly distended, but was slightly more tender to palpation in her RUQ.03/31/20, approximately 2am: Patient examined at bedside for report of non-functioning NGT. XRay showed a kinking of the tube, which was repositioned satisfactorily. The tube was flushed with no resistance. She reported that her pain had improved. 03/31/20, approximately 620 am: Patient reports passing flatus x 2. Abdominal pain improved. Decreased distension, still moderately tender in epigastrium.Signed: Andi Hence, MD, PhD, PGY-2Mobile HB: [phone]/05/216:29 AMI saw and examined the patient with Dr. Nicole Kindred and I agree with the history, physical assessment and plan. Briefly, less abd pain, a little flatus. No bm.Avss, NG 150, UO nrabd soft, mod disten, nontender, tympaniticPSBO, no signs of strangulationCheck axr today to eval degree of distenCont nonop mgmt for now

## 2020-03-31 NOTE — Progress Notes
[facility] Progress NotesHPI:  HPI: Maria [name] is a 69 y.o. female with h/o multiple prior abdominal surgeries including ex-lap sigmoidectomy and hartmann's procedure in 2015, now reversed. The patient's post-operative course has been unfortunately c/b multiple prior SBO necessitating ex-lap and LOA in 2017. The patient presents today w/ a 1 day h/o post-prandial abdominal pain, which worsened overnight. Denies any N/V. Last BM 11pm w/ flatus overnight. In the ED, AF and hemodynamically appropriate, w/  WBC 15.2. [place] abd demonstrates high grade SBO w/ transition point in anterior abdomen. Overnight Events:  - NGT repositioned overnight, 300cc gastric contents- Remains NPO- Denies N/V - Passing flatus x2- Denies BM - AF and hemodynamically appropriate - Pain is improving, 2mg  Dilaudid overnightPhysical Exam:  Vitals:BP (!) 150/71  - Pulse 84  - Temp 98.1 ?F (36.7 ?C) (Oral)  - Resp 18  - Ht 4' 11 (1.499 m)  - Wt 70.3 kg  - SpO2 97%  - BMI 31.31 kg/m? Intake/Output:Intake/Output Summary (Last 24 hours) at 03/31/2020 0603Last data filed at 03/30/2020 1830Gross per 24 hour Intake 720.08 ml Output 150 ml Net 570.08 ml NGT: 300cc gastric contents Physical Exam Gen: awake, alert, not in acute distressHEENT: airway patent, on RA, NGT in placeChest: no resp distressCV: RRRAbd: mildly distended, soft, nontenderReview of Labs/Diagnostics:  Lab Review:CBC:Results in Past 7 DaysResult Component Current Result Hematocrit 44.50 (03/30/2020) Hemoglobin 13.9 (03/30/2020) Platelets 396 (03/30/2020) WBC 17.6 (H) (03/30/2020) Comprehensive Metabolic Panel:Results in Past 7 DaysResult Component Current Result BUN 21 (03/30/2020) Calcium 9.5 (03/30/2020) Chloride 99 (03/30/2020) CO2 26 (03/30/2020) Creatinine 0.79 (03/30/2020) Glucose, Meter 149 (H) (03/30/2020) Potassium 4.6 (03/30/2020) Sodium 137 (03/30/2020) Coags: Results in Past 7 DaysResult Component Current Result INR 1.75 (H) (03/30/2020) Imaging:XR Chest PA or APResult Date: 12/5/2021Enteric tube terminates in the gastric fundus. No acute cardiopulmonary abnormality. Reported And Signed By: Levonne Spiller, MD  [facility] Radiology and Biomedical ImagingXR Chest PA or APResult Date: 12/5/2021Enteric tube terminates in the gastric fundus. No acute cardiopulmonary abnormality. Reported And Signed By: Levonne Spiller, MD  [facility] Radiology and Biomedical ImagingXR Chest PA or AP (Portable)Result Date: 12/4/2021Enteric tube directed upwards in the gastric fundus. Reported And Signed By: Blinda Leatherwood, MD  [facility] Radiology and Biomedical ImagingCT Abdomen Pelvis w IV Contrast (no oral contrast)Result Date: 12/4/2021High-grade small bowel obstruction with transition point in the mid anterior abdomen. No pneumoperitoneum or conspicuous decrease in mucosal enhancement. Report Initiated By:  Keane Scrape, MD Reported And Signed By: Elray Buba, MD  [facility] Radiology and Biomedical ImagingAssessment:  Maria [name] is a 69 y.o. F w a h/o multiple prior abdominal surgeries, admitted for high grade SBO. Passing flatus this AM, although remains distended. Plan to continue to treat conservatively at this time w/ NGT decompression, and NPO/IVF. Awaiting return of bowel function. AC w/ therapeutic Lovenox while inpatient. Will obtain contrast AXR study today Plan: - Pain: Dilaudid sliding scale- AXR contrast study- NGT to LCWS- Diet: NPO/IVF- Strict I/Os- Abx: None- GI: Pepcid- DVT: 100mg  Lovenox - Dispo: Awaiting return of bowel function Signed: Portal Santiago, MD, PGY-IIIGeneral [facility] HospitalMHB: [phone]/08/2019

## 2020-03-31 NOTE — Plan of Care
Plan of Care Overview/ Patient Status    Case Management Screening    Most Recent Value Case Management Screening: Chart review completed. If YES to any question below then proceed to CM Eval/Plan Is there a change in their cognitive function No Is there a change in their base line physical function. Yes Has there been a readmission within the last 30 days No Were there services prior to admission ( Examples: Assisted Living, HD, Homecare, Extended Care Facility, Methadone, SNF, Outpatient Infusion Center) No Negative/Positive Screen Positive Screening: Complete CM Evaluation and Plan IF Screening Completed by TC  Screening completed by: Name Traci Canavan Role: Transition Coordinator Case Manager Attestation: Choose which ONE is appropriate for you I have reviewed the medical record and completed the above screen. CM staff will follow patient's progress and discuss the plan of care with the Treatment Team. Yes I have reviewed the medical record and agree with the above screening by the transition coordinator with the following recommendations. Yes   Case Management Screening    Most Recent Value Case Management Screening: Chart review completed. If YES to any question below then proceed to CM Eval/Plan Is there a change in their cognitive function No Is there a change in their base line physical function. Yes Has there been a readmission within the last 30 days No Were there services prior to admission ( Examples: Assisted Living, HD, Homecare, Extended Care Facility, Methadone, SNF, Outpatient Infusion Center) No Negative/Positive Screen Positive Screening: Complete CM Evaluation and Plan IF Screening Completed by TC  Screening completed by: Name Traci Canavan Role: Transition Coordinator Case Manager Attestation: Choose which ONE is appropriate for you I have reviewed the medical record and completed the above screen. CM staff will follow patient's progress and discuss the plan of care with the Treatment Team. Yes I have reviewed the medical record and agree with the above screening by the transition coordinator with the following recommendations. Yes   Patient is a 69 yo female admitted for SBO. CM will continue to follow and assist with discharge planing needs. Karle Plumber, MSWCase Maangement [phone]

## 2020-04-01 ENCOUNTER — Inpatient Hospital Stay: Admit: 2020-04-01 | Payer: PRIVATE HEALTH INSURANCE

## 2020-04-01 ENCOUNTER — Telehealth: Admit: 2020-04-01 | Payer: PRIVATE HEALTH INSURANCE | Primary: Internal Medicine

## 2020-04-01 DIAGNOSIS — K56609 Unspecified intestinal obstruction, unspecified as to partial versus complete obstruction: Secondary | ICD-10-CM

## 2020-04-01 LAB — BASIC METABOLIC PANEL
BKR ANION GAP: 21 — ABNORMAL HIGH (ref 7–17)
BKR BUN / CREAT RATIO: 26.2 % — ABNORMAL HIGH (ref 8.0–23.0)
BKR CALCIUM: 8.8 mg/dL (ref 8.8–10.2)
BKR CHLORIDE: 99 mmol/L — ABNORMAL HIGH (ref 98–107)
BKR CO2: 15 mmol/L — ABNORMAL LOW (ref 20–30)
BKR CREATININE: 0.61 mg/dL (ref 0.40–1.30)
BKR EGFR (AFR AMER): >60 mL/min/{1.73_m2} (ref >60)
BKR EGFR (NON AFRICAN AMERICAN): >60 mL/min/1.73m2 (ref >60)
BKR GLUCOSE: 117 mg/dL — ABNORMAL HIGH (ref 70–100)
BKR POTASSIUM: 4.4 mmol/L (ref 3.3–5.3)
BKR SODIUM: 135 mmol/L — ABNORMAL LOW (ref 136–144)

## 2020-04-01 LAB — URINALYSIS WITH CULTURE REFLEX      (BH LMW YH)
BKR BILIRUBIN, UA: NEGATIVE
BKR BLOOD, UA: NEGATIVE
BKR CHLORIDE: NEGATIVE mmol/L — ABNORMAL HIGH (ref 98–107)
BKR LEUKOCYTE ESTERASE, UA: NEGATIVE
BKR NITRITE, UA: NEGATIVE
BKR PH, UA: 5.5 (ref 5.5–7.5)
BKR SPECIFIC GRAVITY, UA: 1.029 (ref 1.005–1.030)
BKR UROBILINOGEN, UA: <2 EU/dL (ref <=2.0)

## 2020-04-01 LAB — CBC WITH AUTO DIFFERENTIAL
BKR BLOOD UREA NITROGEN: 85.4 fL (ref 80.0–100.0)
BKR WAM ABSOLUTE IMMATURE GRANULOCYTES.: 0.08 x 1000/ÂµL (ref 0.00–0.30)
BKR WAM ABSOLUTE LYMPHOCYTE COUNT.: 0.9 x 1000/??L — AB (ref 0.60–3.70)
BKR WAM ABSOLUTE NRBC (2 DEC): 0 x 1000/ÂµL (ref 0.00–1.00)
BKR WAM ANALYZER ANC: 14.42 x 1000/??L — ABNORMAL HIGH (ref 2.00–7.60)
BKR WAM BASOPHIL ABSOLUTE COUNT.: 0.04 x 1000/ÂµL (ref 0.00–1.00)
BKR WAM BASOPHILS: 0.2 % (ref 0.0–1.4)
BKR WAM EOSINOPHIL ABSOLUTE COUNT.: 0.01 x 1000/ÂµL (ref 0.00–1.00)
BKR WAM EOSINOPHILS: 0.1 % (ref 0.0–5.0)
BKR WAM HEMATOCRIT (2 DEC): 44 % (ref 35.00–45.00)
BKR WAM HEMOGLOBIN: 13.9 g/dL — ABNORMAL HIGH (ref 11.7–15.5)
BKR WAM IMMATURE GRANULOCYTES: 0.5 % (ref 0.0–1.0)
BKR WAM LYMPHOCYTES: 5.4 % — ABNORMAL LOW (ref 17.0–50.0)
BKR WAM MCH (PG): 27 pg (ref 27.0–33.0)
BKR WAM MCHC: 31.6 g/dL (ref 31.0–36.0)
BKR WAM MCV: 85.4 fL (ref 80.0–100.0)
BKR WAM MONOCYTE ABSOLUTE COUNT.: 1.09 x 1000/ÂµL — ABNORMAL HIGH (ref 0.00–1.00)
BKR WAM MONOCYTES: 6.6 % (ref 4.0–12.0)
BKR WAM MPV: 11.8 fL (ref 8.0–12.0)
BKR WAM NEUTROPHILS: 87.2 % — ABNORMAL HIGH (ref 39.0–72.0)
BKR WAM NUCLEATED RED BLOOD CELLS: 0 % (ref 0.0–1.0)
BKR WAM PLATELETS: 391 x1000/??L (ref 150–420)
BKR WAM RDW-CV: 15.8 % — ABNORMAL HIGH (ref 11.0–15.0)
BKR WAM RED BLOOD CELL COUNT.: 5.15 M/ÂµL (ref 4.00–6.00)
BKR WAM WHITE BLOOD CELL COUNT: 16.5 x1000/ÂµL — ABNORMAL HIGH (ref 4.0–11.0)

## 2020-04-01 LAB — UA REFLEX CULTURE

## 2020-04-01 LAB — MAGNESIUM: BKR MAGNESIUM: 1.8 mg/dL — ABNORMAL LOW (ref 1.7–2.4)

## 2020-04-01 LAB — URINE MICROSCOPIC     (BH GH LMW YH)
BKR HYALINE CASTS, UA INSTRUMENT (NUMERIC): 2 /LPF (ref 0–3)
BKR RBC/HPF INSTRUMENT: 1 /HPF (ref 0–2)
BKR WBC/HPF INSTRUMENT: 1 /HPF (ref 0–5)

## 2020-04-01 LAB — PHOSPHORUS     (BH GH L LMW YH): BKR PHOSPHORUS: 3.7 mg/dL (ref 2.2–4.5)

## 2020-04-01 NOTE — Progress Notes
[facility]   General Surgery Progress NoteAttending Provider: Tacy Dura, MDAdmit Date: 12/4/2021Hospital Day: 3  s/p Code status: Full Code/ACLSAllergy: Morphine; Biaxin  [clarithromycin]; and Latex, natural rubberSubjective: Passing some flatusStill having lots of burpingFeels abdomen is softerStill having some nauseaNurse reports the patient had some vertigo overnightComplains of itching skin s/p benadryl x2Objective: Temp:  [97.9 ?F (36.6 ?C)-98.5 ?F (36.9 ?C)] 97.9 ?F (36.6 ?C)Pulse:  [86-125] 115Resp:  [14-16] 14BP: (133-205)/(64-105) 161/81SpO2:  [95 %-99 %] 98 %Device (Oxygen Therapy): room airI/O last 3 completed shifts:In: 1526.9 [I.V.:1516.9; IV Piggyback:10]Out: 650 [Other:650]CBCLab Results Component Value Date  WBC 16.5 (H) 04/01/2020  HGB 13.9 04/01/2020  HCT 44.00 04/01/2020  PLT 391 04/01/2020 LYTESLab Results Component Value Date  NA 135 (L) 04/01/2020  K 4.4 04/01/2020  CL 99 04/01/2020  CO2 15 (L) 04/01/2020  BUN 16 04/01/2020  CREATININE 0.61 04/01/2020  GLU 117 (H) 04/01/2020  CALCIUM 8.8 04/01/2020  MG 1.8 04/01/2020  PHOS 3.7 04/01/2020 LFTsLab Results Component Value Date  BILITOT 0.2 03/30/2020  BILIDIR <0.2 01/03/2020  AST 26 03/30/2020  ALT 20 03/30/2020  ALKPHOS 141 (H) 03/30/2020  AMYLASE 41 08/22/2014  LIPASE 16 01/03/2020 PT/INR/PTTLab Results Component Value Date  INR 1.89 (H) 03/31/2020  PTT 38.3 (H) 03/31/2020 Diagnostics:XR Abdomen AP w oral contrast (Eval for SBO)Result Date: 12/5/2021Study: XR ABDOMEN AP  Indication:  Small bowel obstruction Comparison:  Earlier the same day at 10:00 AM Again seen are several dilated loops of small bowel, consistent with persistent small bowel obstruction. Oral contrast material remains in the stomach and small bowel. Nasogastric tube is in the proximal stomach and should be advanced. Reported And Signed By: Morene Rankins, MD  [facility] Radiology and Biomedical ImagingXR Abdomen AP And Decub or Erect ViewResult Date: 12/5/2021Study: XR ABDOMEN AP AND DECUB OR ERECT VIEW  Indication:  f/u sbo Comparison:  [place] from 03/30/2020 Again seen are several dilated loops of small bowel, consistent with persistent small bowel obstruction. Oral contrast material remains in the stomach and small bowel. No evidence of free air is seen on the decubitus radiograph. Nasogastric tube is in the proximal stomach and should be advanced. Reported And Signed By: Morene Rankins, MD  [facility] Radiology and Biomedical ImagingPHYSICAL [facility]: Well appearingNEURO: AOx4, nonfocalHEENT: NG with green outputCV: Tachycardic to 110sPULM: Unlabored on room airGI: Soft, nontender, improving distentionAssessment Patient is a 69 y.o. 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 Some improvement on exam today.Plan ?	Will continue nonoperative management?	NPO?	IVF?	Should ambulate TID and be up in chair during the day Electronically Signed UY:QIHK M White, MD12/09/2019, 6:30 AMAttending Addendum:  I saw and examined the patient with Dr. Cliffton Asters and I agree with the history, physical assessment and plan. Briefly, notes abd is softer, passing a little gas, no BM.Avss, HR 100, NG 650Abd soft, nontender, less distenPSBO, clin a little betterCont serial exams; axr todayLeave NGCheck UA

## 2020-04-01 NOTE — Plan of Care
Plan of Care Overview/ Patient Status    1900-0700- Pt A&O X 4, VSS, on RA, pain managed with dilaudid. Pt nauseous 2x, managed with IV Zofran. Pt experienced itching given benadryl.NGT To LCWS draining dark green output. NGT bridled at 60cm. No N/V present. Lungs clear, no signs of distress. +BS, abdomen tender, no BM on shift. Pt voiding to toilet with assist x 1. Skin is intact. NPO status maintained. LR running at 13ml/hr. Safety & comfort maintained. See flowsheet for details.

## 2020-04-01 NOTE — Other
AppointmentInstructions discussed with the patient? YesCopy of instructions sent home with the patient? YesAppointment Providers: NP: Tedra Senegal   Attending: Elisha Ponder

## 2020-04-02 ENCOUNTER — Inpatient Hospital Stay: Admit: 2020-04-02 | Payer: PRIVATE HEALTH INSURANCE

## 2020-04-02 DIAGNOSIS — K56609 Unspecified intestinal obstruction, unspecified as to partial versus complete obstruction: Secondary | ICD-10-CM

## 2020-04-02 LAB — CBC WITH AUTO DIFFERENTIAL
BKR WAM ABSOLUTE IMMATURE GRANULOCYTES.: 0.02 x 1000/??L (ref 0.00–0.30)
BKR WAM ABSOLUTE LYMPHOCYTE COUNT.: 0.72 x 1000/??L (ref 0.60–3.70)
BKR WAM ABSOLUTE NRBC (2 DEC): 0 x 1000/??L (ref 0.00–1.00)
BKR WAM ANALYZER ANC: 5.67 x 1000/??L (ref 2.00–7.60)
BKR WAM BASOPHIL ABSOLUTE COUNT.: 0.02 x 1000/??L (ref 0.00–1.00)
BKR WAM BASOPHILS: 0.3 % (ref 0.0–1.4)
BKR WAM EOSINOPHIL ABSOLUTE COUNT.: 0.07 x 1000/??L (ref 0.00–1.00)
BKR WAM EOSINOPHILS: 0.9 % (ref 0.0–5.0)
BKR WAM HEMATOCRIT (2 DEC): 40.8 % — ABNORMAL HIGH (ref 35.00–45.00)
BKR WAM HEMOGLOBIN: 12.6 g/dL (ref 11.7–15.5)
BKR WAM IMMATURE GRANULOCYTES: 0.3 % (ref 0.0–1.0)
BKR WAM LYMPHOCYTES: 9.6 % — ABNORMAL LOW (ref 17.0–50.0)
BKR WAM MCHC: 30.9 g/dL — ABNORMAL LOW (ref 31.0–36.0)
BKR WAM MONOCYTE ABSOLUTE COUNT.: 1 x 1000/??L (ref 0.00–1.00)
BKR WAM MONOCYTES: 13.3 % — ABNORMAL HIGH (ref 4.0–12.0)
BKR WAM MPV: 10.9 fL (ref 8.0–12.0)
BKR WAM NEUTROPHILS: 75.6 % — ABNORMAL HIGH (ref 39.0–72.0)
BKR WAM NUCLEATED RED BLOOD CELLS: 0 % (ref 0.0–1.0)
BKR WAM PLATELETS: 342 x1000/??L (ref 150–420)
BKR WAM RDW-CV: 15.9 % — ABNORMAL HIGH (ref 11.0–15.0)
BKR WAM RED BLOOD CELL COUNT.: 4.73 M/ÂµL (ref 4.00–6.00)
BKR WAM WHITE BLOOD CELL COUNT: 7.5 x1000/??L (ref 4.0–11.0)

## 2020-04-02 LAB — BASIC METABOLIC PANEL
BKR ANION GAP: 20 mg/dL — ABNORMAL HIGH (ref 7–17)
BKR BLOOD UREA NITROGEN: 19 mg/dL (ref 8–23)
BKR BUN / CREAT RATIO: 30.2 % — ABNORMAL HIGH (ref 8.0–23.0)
BKR CALCIUM: 8.7 mg/dL — ABNORMAL LOW (ref 8.8–10.2)
BKR CHLORIDE: 102 mmol/L (ref 98–107)
BKR CO2: 12 mmol/L — CL (ref 20–30)
BKR CREATININE: 0.63 mg/dL (ref 0.40–1.30)
BKR EGFR (AFR AMER): >60 mL/min/{1.73_m2} (ref >60)
BKR EGFR (NON AFRICAN AMERICAN): >60 mL/min/{1.73_m2} (ref >60)
BKR GLUCOSE: 112 mg/dL — ABNORMAL HIGH (ref 70–100)
BKR POTASSIUM: 4.2 mmol/L (ref 3.3–5.3)
BKR SODIUM: 134 mmol/L — ABNORMAL LOW (ref 136–144)
BKR WAM MCH (PG): 0.63 mg/dL — ABNORMAL LOW (ref 0.40–1.30)
BKR WAM MCV: 19 mg/dL (ref 8–23)

## 2020-04-02 LAB — MAGNESIUM: BKR MAGNESIUM: 2 mg/dL — CL (ref 1.7–2.4)

## 2020-04-02 LAB — PHOSPHORUS     (BH GH L LMW YH): BKR PHOSPHORUS: 2.4 mg/dL (ref 2.2–4.5)

## 2020-04-02 NOTE — Plan of Care
Plan of Care Overview/ Patient Status    NGT remained in place, draining dark green bile. No N/V, but patient has had a return of appetite. She is up to the BR with an assist of one. LR infusing without difficult. Diluadid and Benadryl given for back pain today times one. See flowsheet for details. No BM today. Call bell in reach. Nursing to continue to monitor.

## 2020-04-02 NOTE — Progress Notes
[place] [facility]   General Surgery Progress NoteAttending Provider: Tacy Dura, MDAdmit Date: 12/4/2021Hospital Day: 4  s/p Code status: Full Code/ACLSAllergy: Morphine; Biaxin  [clarithromycin]; and Latex, natural rubberSubjective: WBC normalized this AMNo flatus all day yesterday or todayNG clogging throughout the dayReports frequent belchingObjective: Temp:  [97.5 ?F (36.4 ?C)-98.4 ?F (36.9 ?C)] 97.5 ?F (36.4 ?C)Pulse:  [96-113] 96Resp:  [17-18] 17BP: (137-167)/(57-78) 137/65SpO2:  [94 %-96 %] 96 %Device (Oxygen Therapy): room airI/O last 3 completed shifts:In: 2436.8 [I.V.:2371.8; Other:50; IV Piggyback:15]Out: 550 [Other:550]CBCLab Results Component Value Date  WBC 7.5 04/02/2020  HGB 12.6 04/02/2020  HCT 40.80 04/02/2020  PLT 342 04/02/2020 LYTESLab Results Component Value Date  NA 134 (L) 04/02/2020  K 4.2 04/02/2020  CL 102 04/02/2020  CO2 12 (LL) 04/02/2020  BUN 19 04/02/2020  CREATININE 0.63 04/02/2020  GLU 116 (H) 04/02/2020  CALCIUM 8.7 (L) 04/02/2020  MG 2.0 04/02/2020  PHOS 2.4 04/02/2020 LFTsLab Results Component Value Date  BILITOT 0.2 03/30/2020  BILIDIR <0.2 01/03/2020  AST 26 03/30/2020  ALT 20 03/30/2020  ALKPHOS 141 (H) 03/30/2020  AMYLASE 41 08/22/2014  LIPASE 16 01/03/2020 PT/INR/PTTLab Results Component Value Date  INR 1.89 (H) 03/31/2020  PTT 38.3 (H) 03/31/2020 Diagnostics:XR Abdomen APResult Date: 12/6/2021Study: XR ABDOMEN AP Indication: Confirm NGtube placement. Advanced 5 cm today. Comparison: Earlier the same day mpression: Nasogastric tube is looped in the stomach with tip appearing in the region of the gastroesophageal junction. Persistent dilated small bowel with intraluminal contrast is visualized. Reported And Signed By: Denton Ar, MD  [facility] Radiology and Biomedical ImagingXR Abdomen APResult Date: 12/6/2021XR ABDOMEN AP CLINICAL HISTORY: Small bowel obstruction COMPARISON: X-RAY ABDOMEN, March 31, 2020 FINDINGS: Persistent small bowel obstruction characterized by multiple dilated loops of small bowel. Some of the contrast has likely progressed from the stomach to the small bowel, however most of the contrast remains in the small bowel without traveling to the colon. Again noted is an enteric tube terminating in the stomach. Persistent small bowel obstruction. Report Initiated By:  Assunta Found, MD Reported And Signed By: Liliane Channel, MD  [facility] Radiology and Biomedical ImagingPHYSICAL [facility]: Well appearingNEURO: AOx4, nonfocalHEENT: NG with green output, not draining with flushes--> replaced and now with good suctionCV: Tachycardic to 110sPULM: Unlabored on room airGI: Soft, nontenderAssessment Patient is a 69 y.o. 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 Similar exam to yesterday but no longer passing flatus. AXR showing little movement of contrastPlan ?	No improvement today?	NG clogged yesterday afternoon, exchanged this AM?	NPO?	IVF, using LR due to low bicarb?	Should ambulate TID and be up in chair during the day Electronically Signed BJ:YNWG M White, MD12/10/2019, 6:30 AMAttending Addendum:  Patient seen and examinedReports no pain but also no bowel functionafeb vssAbd soft and mildly distendedWbc nlReadjusting NGTNo bowel functionWill consider surgery on Thursday if no return of bowel function today or tomorrowOOBIV hydration

## 2020-04-02 NOTE — Plan of Care
Plan of Care Overview/ Patient Status    0700-1900.A&Ox 4. 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 Patient reported no numbness or tingling this shift. VSS on ra. Hypertensive at time r/t pain. Patient complained of intermittent abd pain. Pain controlled w/ prn Dilaudid. Dilaudid given twice this shift. Patient has allergic reaction to dilaudid in the form of itching, prn Benadryl given twice this shift. Ng tube advanced this shift by md, xray done. Ng connected to low wall sxn by MD. Continent of bladder and bowel. No bm this shift. Stand by assist w/ rolling walker. Skin intact. 20j Rt fa. Bed low, call bell near. Safety maintained. Medications given per mar. See f/s for more info.

## 2020-04-02 NOTE — Plan of Care
Plan of Care Overview/ Patient Status    1900-0700- Pt A&O X 4, VSS, on RA, pain managed with dilaudid. Pt  Verbalized no nausea or vomit. .NGT?To LCWS draining dark green output. No N/V present. Lungs clear, no signs of distress. +BS, abdomen tender, no BM on shift. Pt voiding to toilet with assist x 1. Skin is intact. NPO status maintained. LR running at 152ml/hr. Safety &?comfort maintained. See flowsheet for details. Leslie.Mor- MD pulled NG, and placed new one.

## 2020-04-03 ENCOUNTER — Inpatient Hospital Stay: Admit: 2020-04-03 | Payer: PRIVATE HEALTH INSURANCE

## 2020-04-03 ENCOUNTER — Encounter: Admit: 2020-04-03 | Payer: PRIVATE HEALTH INSURANCE | Attending: Medical Oncology | Primary: Internal Medicine

## 2020-04-03 ENCOUNTER — Telehealth: Admit: 2020-04-03 | Payer: PRIVATE HEALTH INSURANCE | Attending: Medical Oncology | Primary: Internal Medicine

## 2020-04-03 DIAGNOSIS — K56609 Unspecified intestinal obstruction, unspecified as to partial versus complete obstruction: Secondary | ICD-10-CM

## 2020-04-03 DIAGNOSIS — D6862 Lupus anticoagulant syndrome: Secondary | ICD-10-CM

## 2020-04-03 LAB — CBC WITH AUTO DIFFERENTIAL
BKR WAM ABSOLUTE IMMATURE GRANULOCYTES.: 0.03 x 1000/??L (ref 0.00–0.30)
BKR WAM ABSOLUTE LYMPHOCYTE COUNT.: 0.91 x 1000/??L (ref 0.60–3.70)
BKR WAM ABSOLUTE NRBC (2 DEC): 0 x 1000/??L (ref 0.00–1.00)
BKR WAM ANALYZER ANC: 7.1 x 1000/??L (ref 2.00–7.60)
BKR WAM BASOPHIL ABSOLUTE COUNT.: 0.03 x 1000/ÂµL (ref 0.00–1.00)
BKR WAM BASOPHILS: 0.3 % (ref 0.0–1.4)
BKR WAM EOSINOPHIL ABSOLUTE COUNT.: 0.16 x 1000/??L (ref 0.00–1.00)
BKR WAM EOSINOPHILS: 1.7 % (ref 0.0–5.0)
BKR WAM HEMATOCRIT (2 DEC): 38.4 % (ref 35.00–45.00)
BKR WAM IMMATURE GRANULOCYTES: 0.3 % (ref 0.0–1.0)
BKR WAM LYMPHOCYTES: 9.9 % — ABNORMAL LOW (ref 17.0–50.0)
BKR WAM MCH (PG): 26.6 pg — ABNORMAL LOW (ref 27.0–33.0)
BKR WAM MCHC: 31.8 g/dL — ABNORMAL LOW (ref 31.0–36.0)
BKR WAM MCV: 83.8 fL (ref 80.0–100.0)
BKR WAM MONOCYTE ABSOLUTE COUNT.: 0.94 x 1000/??L (ref 0.00–1.00)
BKR WAM MONOCYTES: 10.3 % (ref 4.0–12.0)
BKR WAM MPV: 11.2 fL (ref 8.0–12.0)
BKR WAM NEUTROPHILS: 77.5 % — ABNORMAL HIGH (ref 39.0–72.0)
BKR WAM NUCLEATED RED BLOOD CELLS: 0 % (ref 0.0–1.0)
BKR WAM PLATELETS: 285 x1000/??L (ref 150–420)
BKR WAM RDW-CV: 16.3 % — ABNORMAL HIGH (ref 11.0–15.0)
BKR WAM RED BLOOD CELL COUNT.: 4.58 M/ÂµL (ref 4.00–6.00)
BKR WAM WHITE BLOOD CELL COUNT: 9.2 x1000/??L (ref 4.0–11.0)

## 2020-04-03 LAB — BASIC METABOLIC PANEL
BKR ANION GAP: 18 — ABNORMAL HIGH (ref 7–17)
BKR BLOOD UREA NITROGEN: 10 mg/dL (ref 8–23)
BKR BUN / CREAT RATIO: 18.2 % — ABNORMAL HIGH (ref 8.0–23.0)
BKR CALCIUM: 8.4 mg/dL — ABNORMAL LOW (ref 8.8–10.2)
BKR CHLORIDE: 104 mmol/L (ref 98–107)
BKR CO2: 15 mmol/L — ABNORMAL LOW (ref 20–30)
BKR CREATININE: 0.55 mg/dL (ref 0.40–1.30)
BKR EGFR (AFR AMER): >60 mL/min/{1.73_m2} (ref >60)
BKR EGFR (NON AFRICAN AMERICAN): >60 mL/min/1.73m2 (ref >60)
BKR GLUCOSE: 76 mg/dL (ref 70–100)
BKR POTASSIUM: 3.6 mmol/L (ref 3.3–5.3)
BKR SODIUM: 137 mmol/L (ref 136–144)

## 2020-04-03 LAB — PHOSPHORUS     (BH GH L LMW YH)
BKR PHOSPHORUS: 1.4 mg/dL — ABNORMAL LOW (ref 2.2–4.5)
BKR WAM HEMOGLOBIN: 1.4 mg/dL — ABNORMAL LOW (ref 2.2–4.5)

## 2020-04-03 LAB — MAGNESIUM: BKR MAGNESIUM: 1.9 mg/dL — ABNORMAL LOW (ref 1.7–2.4)

## 2020-04-03 MED ORDER — MINERAL OIL ORAL
Freq: Once | ORAL | Status: CP
Start: 2020-04-03 — End: ?
  Administered 2020-04-03: 20:00:00 30.000 mL via ORAL

## 2020-04-03 NOTE — Plan of Care
Plan of Care Overview/ Patient Status    Assumed care @ 0700Fs 70, denied any hypoglycemic distress s/s, provider notified with no interventions ordered at this time @ [id number], provider requested prn dextrose admin per order, clarified by phone call, dextrose admin per order, remained asymptomatic from hypo glycemic distress s/s, glucose rechecked @ 144A+o x 4, speech clear, mood pleasant, able to voice needs, bed mobility per self, skin w/d, lscta, no cough observed, denied any sob, abd contour irregular/distended, bs hypoactive x 4 quads, abd soft with epigastric tenderness noted, endorses having flatus, ngt taped to r nostril center @ 51/@ lwcs with small amount of green/brown drainage noted, denied nausea, npo maintained, stated pain mild this am and refused interventions at this time, hr tachy to 100s, bp goal met, safety maintained, see mar and flowsheets for more info

## 2020-04-03 NOTE — Telephone Encounter
RC to [place]. Maria [name] states she is currently admitted in the hospital but needs to have her INR drawn today. Informed [place] the inpatient team can draw her INR. Maria [name] expressed VU, denies further questions/concerns at this time.

## 2020-04-03 NOTE — Progress Notes
As discussed with [place], APRN, plan for patient is to come off of warfarin for 5 full days prior to procedure, and bridge with Lovenox 1.5 mg/kg daily x 4 days prior to procedure, last dose 24 hours before procedure. Patient will be in the hospital from 3-4 days. Patient confirmed that she got our voice message. Will adjust plan to using 1 mg/kg BID because patient still has #9 of the 80 mg syringes, expire in 04/2020. She doesn't recall if she's had copay issues in the past. Patient has used [facility] in the past. Patient will get her INR tested on 04/03/20, she will go after 11AM. Will call and review plan with her over the phone, she prefers not to use MyChart. The plan was sent to provider for review.  Before procedure: - 12/8: Last dose of warfarin before procedure. - 12/9: Do not take any warfarin or inject any Lovenox. - 12/10: Inject Lovenox 80 mg under the skin once in the morning and at night. Do not take any warfarin.- 12/11: Inject Lovenox 80 mg under the skin once in the morning and at night. Do not take any warfarin. - 12/12: Inject Lovenox 80 mg under the skin once in the morning and at night. Do not take any warfarin. - 12/13: Inject Lovenox 80 mg under the skin once in the morning only. Do not take any warfarin.  Day of procedure:- 12/14: Do not inject any Lovenox before your procedure. After the procedure, patient to be hospitalized for an unknown number of days (3-4). She was instructed to call the clinic once she is discharged and we will resume anticoagulation monitoring.  Counseled to go the ED if she has any of the following: - blood in the urine- blood in the stool (can be red or black and tarry)- coughing up blood- vomiting blood- new bruises that have appeared for no reason and are not getting better- a severe headache or a headache that won't go away- if you fall and hit your head- one-sided weakness, numbness, facial droop or garbled words, dizziness or weakness (dial 911)

## 2020-04-03 NOTE — Telephone Encounter
Called patient back and told her that the inpatient team will be managing her INR; patient thinks she'll still be in the hospital next week and isn't sure if she'll be having her surgery. She was counseled to let us know when she is discharged so we can resume anticoag monitoring.

## 2020-04-03 NOTE — Progress Notes
Maria [name] [facility]   General Surgery Progress NoteAttending Provider: Tacy Dura, MDAdmit Date: 12/4/2021Hospital Day: 5  s/p Code status: Full Code/ACLSAllergy: Morphine; Biaxin  [clarithromycin]; and Latex, natural rubberSubjective: NG drained well throughout the day after exchangingPassed flatus overnight and this morningStates she feels her abdomen is much softerAsymptomatic hypoglycemia todayObjective: Temp:  [97.8 ?F (36.6 ?C)-98.3 ?F (36.8 ?C)] 97.9 ?F (36.6 ?C)Pulse:  [83-103] 100Resp:  [15-20] 20BP: (136-154)/(69-77) 154/74SpO2:  [94 %-98 %] 96 %Device (Oxygen Therapy): room airI/O last 3 completed shifts:In: 0 Out: 1150 [Other:1150]CBCLab Results Component Value Date  WBC 9.2 04/03/2020  HGB 12.2 04/03/2020  HCT 38.40 04/03/2020  PLT 285 04/03/2020 LYTESLab Results Component Value Date  NA 137 04/03/2020  K 3.6 04/03/2020  CL 104 04/03/2020  CO2 15 (L) 04/03/2020  BUN 10 04/03/2020  CREATININE 0.55 04/03/2020  GLU 144 (H) 04/03/2020  CALCIUM 8.4 (L) 04/03/2020  MG 1.9 04/03/2020  PHOS 1.4 (L) 04/03/2020 LFTsLab Results Component Value Date  BILITOT 0.2 03/30/2020  BILIDIR <0.2 01/03/2020  AST 26 03/30/2020  ALT 20 03/30/2020  ALKPHOS 141 (H) 03/30/2020  AMYLASE 41 08/22/2014  LIPASE 16 01/03/2020 PT/INR/PTTLab Results Component Value Date  INR 1.89 (H) 03/31/2020  PTT 38.3 (H) 03/31/2020 Diagnostics:XR Abdomen APResult Date: 12/7/2021Study: XR ABDOMEN AP Indication: SBO Comparison: Abdomen April 01, 2020 Limitations: None. Findings : The upper abdomen is partially excluded from the image. Contrast has passed into the distal small bowel and right colon. Proximal small bowel loops now contain air and are mildly dilated. The enteric tube is only partially visualized at the superior margin of the image. IMPRESSION Some resolution of the previously noted dilated small bowel loops. The gas-filled small bowel loops are not as dilated as previously noted contrast has passed into the distal small bowel and right colon. Reported And Signed By: Jerilynn Som, MD  [facility] Radiology and Biomedical ImagingPHYSICAL [facility]: Well appearingNEURO: AOx4, nonfocalHEENT: NG with green output, not draining with flushes--> replaced and now with good suctionCV: Tachycardic to 110sPULM: Unlabored on room airGI: Soft, nontenderAssessment Patient is a 69 y.o. 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 Improvement clinically and radiographically today. Will eventually need operative intervention for adhesions and hernias, but trying to put this off until it can be done electively with non-dilated bowel.Plan ?	Leaving NG today?	Keep NPO, ok for ice water swabs?	IVF, using LR due to low bicarb, can give dextrose boluses for low glucose <70?	Should ambulate at least BID and be up in chair during the day Electronically Signed ZO:XWRU M Rea Kalama, MD12/11/2019, 6:30 AMAttending Addendum:

## 2020-04-03 NOTE — Anesthesia Pre-Procedure Evaluation
This is a 69 y.o. female scheduled for REPAIR COMPLEX VENTRAL HERNIA WITH MESH (N/A )IMPLANTATION, MESH/PROSTHESIS, INCISIONAL/VENTRAL HERNIA REPAIR (N/A ).Review of Systems/ Medical HistoryPatient summary, nursing notes, Labs and pre-procedure vitals, height, weight reviewed.Anesthesia Evaluation:   History of anesthetic complications: Hx of PONV.  Perioperative Screening ToolPONV Screening risk factors: demonstrated PONV.Estimated body mass index is 31.31 kg/m? as calculated from the following:  Height as of this encounter: 4' 11 (1.499 m).  Weight as of this encounter: 70.3 kg. CC/HPI: Skeleton74 yo female with h/o multiple prior abdominal surgeries including ex-lap sigmoidectomy and hartmann's procedure in 2015, now reversed c/b multiple prior SBO necessitating ex-lap and LOA 2017, again presents with SBO. Scheduled for REPAIR COMPLEX VENTRAL HERNIA WITH MESH AND LYSIS OF ADHESIONS 12/9, but not definite. Per surgery: Improvement clinically and radiographically. Will eventually need operative intervention for adhesions and hernias, but trying to put this off until it can be done electively with non-dilated bowel.PAH1/23/20;  i-gel LMA8/07/17; ETT 6.5; Insertion Attempts: 1;  Miller; Blade Size: 2; VC Grade: II; Past Surgical History:  KNEE LIGAMENT RECONSTRUCTION	LAPAROSCOPIC NISSEN FUNDOPLICATIONUMBILICAL HERNIA REPAIR	VENTRAL HERNIA REPAIRTUBAL LIGATION	THYROID BIOPSYCOLOSTOMY	SINUS SURGERYABDOMINAL ADHESION SURGERY	BLEPHAROPLASTYCOLOSTOMY CLOSURE	Cardiovascular:Patient has a history of: hypercholesterolemia and hypertension. -Vascular Disease:  deep vein thrombosis.   Respiratory: -Nicotine Dependence: noHEENT:-Comments: VertigoGastrointestinal/Genitourinary: -Gastrointestinal Disorders:  Patient has diverticulitis, hiatal hernia (s/p nisson) and small bowel obstruction. Patient has GERD.-Nutritional Disorders: Patient has has increased body weight- obesity.-Comments: 03/31/20 XR ABDOMEN AP Indication:  Small bowel obstruction Comparison:  Earlier the same day at 10:00 AMAgain seen are several dilated loops of small bowel, consistent with persistent small bowel obstruction. Oral contrast material remains in the stomach and small bowel. Nasogastric tube is in the proximal stomach and should be advanced.Hematological/Lymphatic: -Anemia:  Patient does not have anemia.-Coagulopathy:  Patient has abnormal PT/PTT function (INR 1.89, Warfarin at home, lovenox here). She has no thrombocytopenia.Endocrine/Metabolic: -Diabetes mellitus:  Patient has diabetes mellitus.-Comments: Lupus anticoagulant--+drrvt?with negative serologies for common lupus anticoagulantsIschemic colitis 2015Behavioral/Psychiatric & Syndromes:  Patient has depression.Additional Findings: Labs 12/8: h/h 12.2/38.4K 3.6, Mg 1.9, phos 1.4NEEDS REPEAT COVID TEST, team aware Anesthesia Plan

## 2020-04-03 NOTE — Plan of Care
Plan of Care Overview/ Patient Status     Maria [name], 69 y.o. femaleA/O x 4, Afebrile, VSS, on room air satting well. Pt complaint of 8/10 pain in back and shoulder. Given dilaudid 2mg  IV @ 0230 with + effect. No PO intake, Voiding spontaneously in bathroom, no BM this shift. Has NG tube taped @ 51 to low cont suction. Greenish brown drainage. Ambulation: standby assist. IV site was leaking, dressing changed and working well. LR running @ 130ml/hr. No remarkable events, call bell in reach, safety maintained, hourly rounding. Lyell Clugston L Michaeljohn Biss, RN12/8/2021BP (!) 145/71  - Pulse 83  - Temp 97.8 ?F (36.6 ?C) (Oral)  - Resp 16  - Ht 4' 11 (1.499 m)  - Wt 70.3 kg  - SpO2 96%  - BMI 31.31 kg/m?

## 2020-04-03 NOTE — Telephone Encounter
Rec'd call from pt, she has been in the hospital since sat 03/30/20 due to a blood clot.pt suppose to have surgery on 04/09/20. Pt is requesting a call back to discuss [phone] suppose to have her INR check today 04/03/20

## 2020-04-04 ENCOUNTER — Inpatient Hospital Stay: Admit: 2020-04-04 | Payer: PRIVATE HEALTH INSURANCE

## 2020-04-04 ENCOUNTER — Encounter: Admit: 2020-04-04 | Payer: PRIVATE HEALTH INSURANCE | Attending: Family | Primary: Internal Medicine

## 2020-04-04 DIAGNOSIS — K56609 Unspecified intestinal obstruction, unspecified as to partial versus complete obstruction: Secondary | ICD-10-CM

## 2020-04-04 LAB — CBC WITH AUTO DIFFERENTIAL
BKR WAM ABSOLUTE IMMATURE GRANULOCYTES.: 0.04 x 1000/??L (ref 0.00–0.30)
BKR WAM ABSOLUTE LYMPHOCYTE COUNT.: 1.06 x 1000/??L (ref 0.60–3.70)
BKR WAM ABSOLUTE NRBC (2 DEC): 0 x 1000/??L (ref 0.00–1.00)
BKR WAM ANALYZER ANC: 9.79 x 1000/??L — ABNORMAL HIGH (ref 2.00–7.60)
BKR WAM BASOPHIL ABSOLUTE COUNT.: 0.03 x 1000/??L (ref 0.00–1.00)
BKR WAM BASOPHILS: 0.3 % (ref 0.0–1.4)
BKR WAM EOSINOPHIL ABSOLUTE COUNT.: 0.12 x 1000/??L (ref 0.00–1.00)
BKR WAM EOSINOPHILS: 1 % (ref 0.0–5.0)
BKR WAM HEMATOCRIT (2 DEC): 40.2 % (ref 35.00–45.00)
BKR WAM HEMOGLOBIN: 12.8 g/dL — ABNORMAL HIGH (ref 11.7–15.5)
BKR WAM IMMATURE GRANULOCYTES: 0.3 % (ref 0.0–1.0)
BKR WAM LYMPHOCYTES: 8.9 % — ABNORMAL LOW (ref 17.0–50.0)
BKR WAM MCH (PG): 26.2 pg — ABNORMAL LOW (ref 27.0–33.0)
BKR WAM MCHC: 31.8 g/dL — ABNORMAL LOW (ref 31.0–36.0)
BKR WAM MCV: 82.2 fL (ref 80.0–100.0)
BKR WAM MONOCYTE ABSOLUTE COUNT.: 0.81 x 1000/??L (ref 0.00–1.00)
BKR WAM MONOCYTES: 6.8 % (ref 4.0–12.0)
BKR WAM MPV: 11.2 fL (ref 8.0–12.0)
BKR WAM NEUTROPHILS: 82.7 % — ABNORMAL HIGH (ref 39.0–72.0)
BKR WAM NUCLEATED RED BLOOD CELLS: 0 % (ref 0.0–1.0)
BKR WAM PLATELETS: 302 x1000/??L (ref 150–420)
BKR WAM RDW-CV: 16.2 % — ABNORMAL HIGH (ref 11.0–15.0)
BKR WAM RED BLOOD CELL COUNT.: 4.89 M/ÂµL (ref 4.00–6.00)
BKR WAM WHITE BLOOD CELL COUNT: 11.9 x1000/ÂµL — ABNORMAL HIGH (ref 4.0–11.0)

## 2020-04-04 LAB — BASIC METABOLIC PANEL
BKR ANION GAP: 21 — ABNORMAL HIGH (ref 7–17)
BKR BLOOD UREA NITROGEN: 8 mg/dL (ref 8–23)
BKR BUN / CREAT RATIO: 16 % — ABNORMAL HIGH (ref 8.0–23.0)
BKR CALCIUM: 8.6 mg/dL — ABNORMAL LOW (ref 8.8–10.2)
BKR CHLORIDE: 103 mmol/L — ABNORMAL HIGH (ref 98–107)
BKR CO2: 13 mmol/L — ABNORMAL LOW (ref 20–30)
BKR CREATININE: 0.5 mg/dL — ABNORMAL LOW (ref 0.40–1.30)
BKR EGFR (AFR AMER): >60 mL/min/{1.73_m2} (ref >60)
BKR EGFR (NON AFRICAN AMERICAN): >60 mL/min/{1.73_m2} (ref >60)
BKR GLUCOSE: 82 mg/dL (ref 70–100)
BKR POTASSIUM: 3.5 mmol/L (ref 3.3–5.3)
BKR SODIUM: 137 mmol/L (ref 136–144)

## 2020-04-04 LAB — MAGNESIUM: BKR MAGNESIUM: 1.8 mg/dL — ABNORMAL LOW (ref 1.7–2.4)

## 2020-04-04 LAB — COVID-19 CLEARANCE OR FOR PLACEMENT ONLY: BKR SARS-COV-2 RNA (COVID-19) (YH): NEGATIVE % — ABNORMAL LOW (ref 4.0–12.0)

## 2020-04-04 LAB — PHOSPHORUS     (BH GH L LMW YH): BKR PHOSPHORUS: 1.8 mg/dL — ABNORMAL LOW (ref 2.2–4.5)

## 2020-04-04 MED ORDER — OXYCODONE IMMEDIATE RELEASE 15 MG TABLET
15 mg | ORAL | Status: DC | PRN
Start: 2020-04-04 — End: 2020-04-06

## 2020-04-04 MED ORDER — HYDROMORPHONE 0.5 MG/0.5 ML INJECTION SYRINGE
0.5 mg/ mL | INTRAVENOUS | Status: DC | PRN
Start: 2020-04-04 — End: 2020-04-05

## 2020-04-04 MED ORDER — OXYCODONE IMMEDIATE RELEASE 5 MG TABLET
5 mg | ORAL | Status: DC | PRN
Start: 2020-04-04 — End: 2020-04-06
  Administered 2020-04-05: 03:00:00 5 mg via ORAL

## 2020-04-04 MED ORDER — OXYCODONE IMMEDIATE RELEASE 5 MG TABLET
5 mg | ORAL | Status: DC | PRN
Start: 2020-04-04 — End: 2020-04-06

## 2020-04-04 NOTE — Plan of Care
Plan of Care Overview/ Patient Status    1900-0700[name], 69 y.o. femaleA/O x 4, Afebrile, VSS, on room air satting well, pain in L shoulder, ice pack providing relief. C/O sore dry throat, using phenol spray Q4. No PO intake. Ng tube removed. Voiding spontaneously in bathroom. No Bm this shift. Ambulation: standby assist. LR running @ 176ml/hr. Pt very anxious and tearful while unsure about possible surgery today 12/9. Provider came and spoke with her to provide more information. Call bell in reach, safety maintained, hourly rounding. Maria [name], RN12/9/2021BP (!) 149/82  - Pulse 83  - Temp 97.8 ?F (36.6 ?C) (Oral)  - Resp 18  - Ht 4' 11 (1.499 m)  - Wt 70.3 kg  - SpO2 96%  - BMI 31.31 kg/m?

## 2020-04-05 ENCOUNTER — Inpatient Hospital Stay: Admit: 2020-04-05 | Payer: PRIVATE HEALTH INSURANCE

## 2020-04-05 DIAGNOSIS — Z794 Long term (current) use of insulin: Secondary | ICD-10-CM

## 2020-04-05 DIAGNOSIS — K5651 Intestinal adhesions [bands], with partial obstruction: Secondary | ICD-10-CM

## 2020-04-05 DIAGNOSIS — Z91040 Latex allergy status: Secondary | ICD-10-CM

## 2020-04-05 DIAGNOSIS — Z6831 Body mass index (BMI) 31.0-31.9, adult: Secondary | ICD-10-CM

## 2020-04-05 DIAGNOSIS — N939 Abnormal uterine and vaginal bleeding, unspecified: Secondary | ICD-10-CM

## 2020-04-05 DIAGNOSIS — Z20822 Contact with and (suspected) exposure to covid-19: Secondary | ICD-10-CM

## 2020-04-05 DIAGNOSIS — E7800 Pure hypercholesterolemia, unspecified: Secondary | ICD-10-CM

## 2020-04-05 DIAGNOSIS — Z885 Allergy status to narcotic agent status: Secondary | ICD-10-CM

## 2020-04-05 DIAGNOSIS — F32A Depression, unspecified: Secondary | ICD-10-CM

## 2020-04-05 DIAGNOSIS — E119 Type 2 diabetes mellitus without complications: Secondary | ICD-10-CM

## 2020-04-05 DIAGNOSIS — E669 Obesity, unspecified: Secondary | ICD-10-CM

## 2020-04-05 DIAGNOSIS — D6862 Lupus anticoagulant syndrome: Secondary | ICD-10-CM

## 2020-04-05 DIAGNOSIS — K56609 Unspecified intestinal obstruction, unspecified as to partial versus complete obstruction: Secondary | ICD-10-CM

## 2020-04-05 DIAGNOSIS — K219 Gastro-esophageal reflux disease without esophagitis: Secondary | ICD-10-CM

## 2020-04-05 LAB — CBC WITH AUTO DIFFERENTIAL
BKR CALCIUM: 78.6 % — ABNORMAL HIGH (ref 39.0–72.0)
BKR EGFR (AFR AMER): 16.1 % — ABNORMAL HIGH (ref 11.0–15.0)
BKR WAM ABSOLUTE IMMATURE GRANULOCYTES.: 0.04 x 1000/??L (ref 0.00–0.30)
BKR WAM ABSOLUTE IMMATURE GRANULOCYTES.: 0.05 x 1000/??L (ref 0.00–0.30)
BKR WAM ABSOLUTE LYMPHOCYTE COUNT.: 1.45 x 1000/??L (ref 0.60–3.70)
BKR WAM ABSOLUTE LYMPHOCYTE COUNT.: 1.45 x 1000/??L (ref 0.60–3.70)
BKR WAM ABSOLUTE NRBC (2 DEC): 0 x 1000/??L — ABNORMAL HIGH (ref 0.00–1.00)
BKR WAM ABSOLUTE NRBC (2 DEC): 0 x 1000/??L (ref 0.00–1.00)
BKR WAM ANALYZER ANC: 9.61 x 1000/??L — ABNORMAL HIGH (ref 2.00–7.60)
BKR WAM ANALYZER ANC: 9.31 x 1000/??L — ABNORMAL HIGH (ref 2.00–7.60)
BKR WAM BASOPHIL ABSOLUTE COUNT.: 0.03 x 1000/ÂµL (ref 0.00–1.00)
BKR WAM BASOPHIL ABSOLUTE COUNT.: 0.05 x 1000/??L (ref 0.00–1.00)
BKR WAM BASOPHILS: 0.2 % (ref 0.0–1.4)
BKR WAM BASOPHILS: 0.4 % (ref 0.0–1.4)
BKR WAM EOSINOPHIL ABSOLUTE COUNT.: 0.13 x 1000/??L (ref 0.00–1.00)
BKR WAM EOSINOPHIL ABSOLUTE COUNT.: 0.16 x 1000/??L (ref 0.00–1.00)
BKR WAM EOSINOPHILS: 1.1 % (ref 0.0–5.0)
BKR WAM EOSINOPHILS: 1.3 % (ref 0.0–5.0)
BKR WAM HEMATOCRIT (2 DEC): 38 % (ref 35.00–45.00)
BKR WAM HEMATOCRIT (2 DEC): 41.4 % — ABNORMAL LOW (ref 35.00–45.00)
BKR WAM HEMOGLOBIN: 12.7 g/dL (ref 11.7–15.5)
BKR WAM HEMOGLOBIN: 13.7 g/dL (ref 11.7–15.5)
BKR WAM IMMATURE GRANULOCYTES: 0.3 % (ref 0.0–1.0)
BKR WAM IMMATURE GRANULOCYTES: 0.4 % (ref 0.0–1.0)
BKR WAM LYMPHOCYTES: 11.8 % — ABNORMAL LOW (ref 17.0–50.0)
BKR WAM LYMPHOCYTES: 12.2 % — ABNORMAL LOW (ref 17.0–50.0)
BKR WAM MCH (PG): 27.1 pg — ABNORMAL LOW (ref 27.0–33.0)
BKR WAM MCH (PG): 26.5 pg — ABNORMAL LOW (ref 27.0–33.0)
BKR WAM MCHC: 33.4 g/dL (ref 31.0–36.0)
BKR WAM MCHC: 33.1 g/dL (ref 31.0–36.0)
BKR WAM MCV: 81.2 fL (ref 80.0–100.0)
BKR WAM MCV: 80.1 fL (ref 80.0–100.0)
BKR WAM MONOCYTE ABSOLUTE COUNT.: 1 x 1000/??L (ref 0.00–1.00)
BKR WAM MONOCYTE ABSOLUTE COUNT.: 0.84 x 1000/??L (ref 0.00–1.00)
BKR WAM MONOCYTES: 8.2 % (ref 4.0–12.0)
BKR WAM MONOCYTES: 7.1 % (ref 4.0–12.0)
BKR WAM MPV: 11 fL (ref 8.0–12.0)
BKR WAM MPV: 11.1 fL (ref 8.0–12.0)
BKR WAM NEUTROPHILS: 78.4 % — ABNORMAL HIGH (ref 39.0–72.0)
BKR WAM NEUTROPHILS: 78.6 % — ABNORMAL HIGH (ref 39.0–72.0)
BKR WAM NUCLEATED RED BLOOD CELLS: 0 % (ref 0.0–1.0)
BKR WAM NUCLEATED RED BLOOD CELLS: 0 % (ref 0.0–1.0)
BKR WAM PLATELETS: 320 x1000/??L (ref 150–420)
BKR WAM PLATELETS: 331 x1000/??L (ref 150–420)
BKR WAM RDW-CV: 16.1 % — ABNORMAL HIGH (ref 11.0–15.0)
BKR WAM RDW-CV: 16.1 % — ABNORMAL HIGH (ref 11.0–15.0)
BKR WAM RED BLOOD CELL COUNT.: 4.68 M/??L — ABNORMAL HIGH (ref 4.00–6.00)
BKR WAM RED BLOOD CELL COUNT.: 5.17 M/??L (ref 4.00–6.00)
BKR WAM WHITE BLOOD CELL COUNT: 11.9 x1000/ÂµL — ABNORMAL HIGH (ref 4.0–11.0)

## 2020-04-05 LAB — BASIC METABOLIC PANEL
BKR ANION GAP: 18 — ABNORMAL HIGH (ref 7–17)
BKR ANION GAP: 18 g/dL — ABNORMAL HIGH (ref 7–17)
BKR BLOOD UREA NITROGEN: 8 mg/dL (ref 8–23)
BKR BLOOD UREA NITROGEN: 9 mg/dL (ref 8–23)
BKR BUN / CREAT RATIO: 15.1 (ref 8.0–23.0)
BKR BUN / CREAT RATIO: 17 % — ABNORMAL LOW (ref 8.0–23.0)
BKR CALCIUM: 7.7 mg/dL — ABNORMAL LOW (ref 8.8–10.2)
BKR CHLORIDE: 104 mmol/L (ref 98–107)
BKR CHLORIDE: 104 mmol/L (ref 98–107)
BKR CO2: 15 mmol/L — ABNORMAL LOW (ref 20–30)
BKR CO2: 16 mmol/L — ABNORMAL LOW (ref 20–30)
BKR CREATININE: 0.53 mg/dL (ref 0.40–1.30)
BKR CREATININE: 0.53 mg/dL (ref 0.40–1.30)
BKR EGFR (AFR AMER): >60 mL/min/{1.73_m2} (ref >60)
BKR EGFR (NON AFRICAN AMERICAN): >60 mL/min/{1.73_m2} (ref >60)
BKR EGFR (NON AFRICAN AMERICAN): >60 mL/min/{1.73_m2} (ref >60)
BKR GLUCOSE: 120 mg/dL — ABNORMAL HIGH (ref 70–100)
BKR GLUCOSE: 119 mg/dL — ABNORMAL HIGH (ref 70–100)
BKR POTASSIUM: 3 mmol/L — ABNORMAL LOW (ref 3.3–5.3)
BKR POTASSIUM: 3.1 mmol/L — ABNORMAL LOW (ref 3.3–5.3)
BKR SODIUM: 137 mmol/L (ref 136–144)
BKR SODIUM: 138 mmol/L (ref 136–144)

## 2020-04-05 LAB — PHOSPHORUS     (BH GH L LMW YH)
BKR PHOSPHORUS: 2.2 mg/dL (ref 2.2–4.5)
BKR PHOSPHORUS: 2.4 mg/dL (ref 2.2–4.5)

## 2020-04-05 LAB — MAGNESIUM
BKR MAGNESIUM: 1.7 mg/dL (ref 1.7–2.4)
BKR MAGNESIUM: 2.1 mg/dL (ref 1.7–2.4)

## 2020-04-05 MED ORDER — MINERAL OIL ORAL
Freq: Once | ORAL | Status: DC
Start: 2020-04-05 — End: 2020-04-06

## 2020-04-05 MED ORDER — ENOXAPARIN 100 MG/ML SUBCUTANEOUS SYRINGE
100 mg/mL | Freq: Every day | SUBCUTANEOUS | 1 refills | Status: SS
Start: 2020-04-05 — End: 2020-04-14

## 2020-04-05 MED ORDER — POTASSIUM CHLORIDE 20 MEQ/15 ML ORAL LIQUID
Freq: Once | ORAL | Status: CP
Start: 2020-04-05 — End: ?
  Administered 2020-04-05: 16:00:00 via ORAL

## 2020-04-05 MED ORDER — FAMOTIDINE 20 MG TABLET
20 mg | Freq: Two times a day (BID) | ORAL | Status: DC
Start: 2020-04-05 — End: 2020-04-06

## 2020-04-05 MED ORDER — POTASSIUM CHLORIDE 10 MEQ/100ML IN STERILE WATER INTRAVENOUS PIGGYBACK
10 mEq/0 mL | INTRAVENOUS | Status: AC
Start: 2020-04-05 — End: ?

## 2020-04-05 NOTE — Discharge Instructions
Your surgery is scheduled for 04/09/2020 with Dr. [name]. Please follow-up with Dr. Prescilla Sours on 04/29/2020 with regards to your transvaginal ultrasound. DIET: Full liquid diet. ACTIVITY: ?	Activity as tolerated MEDICATIONS: Please continue to hold coumadin. Continue lovenox 100mg  daily 12/11-12/13. Do not inject on the morning of surgery (12/14).Otherwise home medications as prescribed.PLEASE CALL MD if you experience any of the following: ?	Temperature greater than 101.4 F ?	Persistent nausea, vomiting, abdominal pain or inability to tolerate fluids ?	Shortness of breath

## 2020-04-05 NOTE — Progress Notes
[facility]   General Surgery Progress NoteAttending Provider: Tacy Dura, MDAdmit Date: 12/4/2021Hospital Day: 6  s/p Code status: Full Code/ACLSAllergy: Morphine; Biaxin  [clarithromycin]; and Latex, natural rubberSubjective: NG removedSmall BM overnightPassing gas overnight and this AMObjective: Temp:  [97.8 ?F (36.6 ?C)-98.1 ?F (36.7 ?C)] 97.8 ?F (36.6 ?C)Pulse:  [83-101] 83Resp:  [18-20] 18BP: (149-178)/(64-82) 149/82SpO2:  [96 %-100 %] 96 %Device (Oxygen Therapy): room airI/O last 3 completed shifts:In: 5878.8 [I.V.:5853.8; IV Piggyback:25]Out: - CBCLab Results Component Value Date  WBC 11.9 (H) 04/04/2020  HGB 12.8 04/04/2020  HCT 40.20 04/04/2020  PLT 302 04/04/2020 LYTESLab Results Component Value Date  NA 137 04/04/2020  K 3.5 04/04/2020  CL 103 04/04/2020  CO2 13 (L) 04/04/2020  BUN 8 04/04/2020  CREATININE 0.50 04/04/2020  GLU 82 04/04/2020  CALCIUM 8.6 (L) 04/04/2020  MG 1.8 04/04/2020  PHOS 1.8 (L) 04/04/2020 LFTsLab Results Component Value Date  BILITOT 0.2 03/30/2020  BILIDIR <0.2 01/03/2020  AST 26 03/30/2020  ALT 20 03/30/2020  ALKPHOS 141 (H) 03/30/2020  AMYLASE 41 08/22/2014  LIPASE 16 01/03/2020 PT/INR/PTTLab Results Component Value Date  INR 1.89 (H) 03/31/2020  PTT 38.3 (H) 03/31/2020 Diagnostics:XR Abdomen APResult Date: 12/8/2021CLINICAL HISTORY: SBO COMPARISON: X-ray abdomen, April 02, 2020 FINDINGS: Marked dilatation of the small bowel seen from previous studies are no longer evident. The contrast visualized in the small bowel loops from yesterday is no longer visualized. There is still residual contrast in the colon. Resolving small bowel obstruction. Report Initiated By:  Assunta Found, MD Reported And Signed By: Jerilynn Som, MD  [facility] Radiology and Biomedical ImagingPHYSICAL [facility]: Well appearingNEURO: AOx4, nonfocalCV: RRPULM: Unlabored on room airGI: Soft, nontenderAssessment Patient is a 69 y.o. 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 Seems to have resolvedPlan ?	Awaiting AXR this AM?	Still discussing whether to do surgery today or wait for an elective operation next week or later Electronically Signed UV:OZDG M White, MD12/12/2019, 6:30 AMAttending Addendum:  Patient had been seen and plans as above

## 2020-04-05 NOTE — Progress Notes
[facility]   General Surgery Progress NoteAttending Provider: Tacy Dura, MDAdmit Date: 12/4/2021Hospital Day: 7  s/p Code status: Full Code/ACLSAllergy: Morphine; Biaxin  [clarithromycin]; and Latex, natural rubberSubjective: Tolerated full liquids for dinnerNo vomiting, passing flatus and some diarrheaFeels abdomen is softReports vaginal bleeding started last night. Had some vaginal bleeding 3 weeks ago and was supposed to follow up with Dr. Prescilla Sours (gyn) but appointment was cancelled due to this hospitalizationObjective: Temp:  [97.5 ?F (36.4 ?C)-98 ?F (36.7 ?C)] 98 ?F (36.7 ?C)Pulse:  [82-110] 82Resp:  [18-20] 19BP: (120-183)/(73-93) 120/73SpO2:  [98 %-100 %] 99 %Device (Oxygen Therapy): room airI/O last 3 completed shifts:In: 240 [P.O.:240]Out: - CBCLab Results Component Value Date  WBC 11.9 (H) 04/05/2020  HGB 13.7 04/05/2020  HCT 41.40 04/05/2020  PLT 331 04/05/2020 LYTESLab Results Component Value Date  NA 138 04/05/2020  K 3.1 (L) 04/05/2020  CL 104 04/05/2020  CO2 16 (L) 04/05/2020  BUN 9 04/05/2020  CREATININE 0.53 04/05/2020  GLU 132 (H) 04/05/2020  CALCIUM 9.0 04/05/2020  MG 2.1 04/05/2020  PHOS 2.4 04/05/2020 LFTsLab Results Component Value Date  BILITOT 0.2 03/30/2020  BILIDIR <0.2 01/03/2020  AST 26 03/30/2020  ALT 20 03/30/2020  ALKPHOS 141 (H) 03/30/2020  AMYLASE 41 08/22/2014  LIPASE 16 01/03/2020 PT/INR/PTTLab Results Component Value Date  INR 1.89 (H) 03/31/2020  PTT 38.3 (H) 03/31/2020 Diagnostics:No results found.PHYSICAL [facility]AMGEN: Well appearingNEURO: AOx4, nonfocalCV: RRPULM: Unlabored on room airGI: Soft, nontenderAssessment Patient is a 69 y.o. 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 Seems to have resolvedPlan ?	Will call patient's gynecologist this AM and see if they want to evaluate her here or as an outpatient?	Will discharge on full liquid diet + nutritional supplements?	Will go home on lovenox rather than restarting and then stopping coumadin (for lupus anticoagulant)?	Plan for elective surgery on Tuesday Electronically Signed ZO:XWRU M White, MD12/01/2020, 6:30 AMAttending Addendum:  Patient seen and examinedabd softPlan to DC as abovelovenox bridge

## 2020-04-05 NOTE — Plan of Care
Plan of Care Overview/ Patient Status    1900 - 0700Neuro: A&Ox4, calm, cooperative and pleasant. No c/o numbness/tinglingCV: VSS, HTN at times. No c/o chest painResp: RA, no c/o SOB, dyspneaGI/GU: Continent of b/b - up to toilet independently. Full liquid diet started today. Patient c/o nausea. Zofran given with good effect. Few episodes of diarrhea this evening. Abd pain, oxycodone 5mg  given with good effect.*Patient also c/o vaginal bleeding with wiping and says she had a few episodes of this today. States she was supposed to go to the OBGYN on the 6th but was in the hospital. Covering MD was notified. Musc: Independent OOB, steady gait. Walks ad lib. Skin: Scattered bruising, but otherwise intact.Pain: 6/10 abd pain controlled with oxycodone. IV: L 20g was removed d/t signs of infiltration. New 21g placed in R AC. C/D/I Fall and safety precautions maintained. Will continue to monitor. Problem: Adult Inpatient Plan of CareGoal: Plan of Care ReviewOutcome: Interventions implemented as appropriateGoal: Patient-Specific Goal (Individualized)Outcome: Interventions implemented as appropriateGoal: Absence of Hospital-Acquired Illness or InjuryOutcome: Interventions implemented as appropriateGoal: Optimal Comfort and WellbeingOutcome: Interventions implemented as appropriateGoal: Readiness for Transition of CareOutcome: Interventions implemented as appropriate

## 2020-04-05 NOTE — Other
Plan of Care Overview/ Patient Status    Discharge: patient received discharge instructions. She was able to demonstrate that she could give Lovenox at home. Aware that she will not take morning of surgery. IVR removed. Patient left with escort and husband.

## 2020-04-05 NOTE — Progress Notes
Brief documentation noteSofia [name] is a 69 yo [id number] admitted for SBO, managed conservatively and now ready for discharge. Phone call to patient at request of primary team. Patient was scheduled for outpatient follow-up 04/01/20 with Dr. Prescilla Sours for ongoing post-menopausal bleeding. History is notable for previously noted postmenopausal bleeding in the setting of a endometrial polyp, noted on hysteroscopy D&C 05/19/2018. She has not had any interval pelvic imaging since. Primary team requested inpatient GYN service to speak with patient in an effort to expedite her care.Patient states that since her hysteroscopy/polypectomy, she has had no further vaginal bleeding until 3 weeks ago, when she had a small episode of vaginal bleeding x 1 day similar to menstrual bleeding.Last night, she had recurrent vaginal bleeding that started yesterday and is ongoing today. Bleeding is minimal - just light spotting every time she pees and wipes after going to the bathroom. No clots. She does not need to wear a pad while she is at rest in her bed and has not soiled any underwear or bed sheets from her vaginal bleeding. She describes her bleeding as minimal, light spotting. Patient is amenable to outpatient follow-up. She feels reassured that her next visit with Dr. Prescilla Sours is rescheduled for 04/29/20. Vital signs are stable. Hct stable at 38-41 while inpatient. She has no lightheadedness, dizziness, or shortness of breath. - Please obtain transvaginal ultrasound- Follow-up as planned with Dr. Prescilla Sours. Follow-up visit is already re-scheduled for 04/29/20 at 11:30 AM. Discussed with Dr. Zenda Alpers, GYN chief resident.James Ivanoff, MDPGY3, OB/GYNBest contact: Mobile Heartbeat12/01/2020 1:25 PM

## 2020-04-05 NOTE — Plan of Care
Plan of Care Overview/ Patient Status    0700-1900Pt A&Ox4. Anxious at times. Afebrile. SBP 130-140s. One hypertensive episode this shift, provider notified, pt asymptomatic. HR 80 - low 100s. Satting well on RA. Diet advanced to full liquid. C/o mild nausea, refused zofran. Abd soft/tender to epigastric area. VS, up to toilet. +BS. BM x1 this shift per pt. Ind OOB. Denies pain. Safety maintained, Meds given per MAR. Call bell within reach. See flowsheet for more information. Problem: Adult Inpatient Plan of CareGoal: Plan of Care ReviewOutcome: Interventions implemented as appropriateGoal: Patient-Specific Goal (Individualized)Outcome: Interventions implemented as appropriateGoal: Absence of Hospital-Acquired Illness or InjuryOutcome: Interventions implemented as appropriateGoal: Optimal Comfort and WellbeingOutcome: Interventions implemented as appropriateGoal: Readiness for Transition of CareOutcome: Interventions implemented as appropriate Problem: InfectionGoal: Absence of Infection Signs and SymptomsOutcome: Interventions implemented as appropriate Problem: Fall Injury RiskGoal: Absence of Fall and Fall-Related InjuryOutcome: Interventions implemented as appropriate Problem: Skin Injury Risk IncreasedGoal: Skin Health and IntegrityOutcome: Interventions implemented as appropriate

## 2020-04-08 ENCOUNTER — Encounter: Admit: 2020-04-08 | Payer: PRIVATE HEALTH INSURANCE | Attending: Surgery | Primary: Internal Medicine

## 2020-04-08 ENCOUNTER — Encounter: Admit: 2020-04-08 | Payer: PRIVATE HEALTH INSURANCE | Attending: Adult Health | Primary: Internal Medicine

## 2020-04-08 ENCOUNTER — Ambulatory Visit: Admit: 2020-04-08 | Payer: PRIVATE HEALTH INSURANCE | Attending: Anesthesiology | Primary: Internal Medicine

## 2020-04-08 ENCOUNTER — Ambulatory Visit: Admit: 2020-04-08 | Payer: PRIVATE HEALTH INSURANCE | Attending: Internal Medicine | Primary: Internal Medicine

## 2020-04-08 ENCOUNTER — Telehealth: Admit: 2020-04-08 | Payer: PRIVATE HEALTH INSURANCE | Attending: Pharmacotherapy | Primary: Internal Medicine

## 2020-04-08 DIAGNOSIS — L719 Rosacea, unspecified: Secondary | ICD-10-CM

## 2020-04-08 DIAGNOSIS — R42 Dizziness and giddiness: Secondary | ICD-10-CM

## 2020-04-08 DIAGNOSIS — E119 Type 2 diabetes mellitus without complications: Secondary | ICD-10-CM

## 2020-04-08 DIAGNOSIS — K219 Gastro-esophageal reflux disease without esophagitis: Secondary | ICD-10-CM

## 2020-04-08 DIAGNOSIS — K55039 Acute (reversible) ischemia of large intestine, extent unspecified: Secondary | ICD-10-CM

## 2020-04-08 DIAGNOSIS — D219 Benign neoplasm of connective and other soft tissue, unspecified: Secondary | ICD-10-CM

## 2020-04-08 DIAGNOSIS — Z98811 Dental restoration status: Secondary | ICD-10-CM

## 2020-04-08 DIAGNOSIS — F32A Depression: Secondary | ICD-10-CM

## 2020-04-08 DIAGNOSIS — Z01818 Encounter for other preprocedural examination: Secondary | ICD-10-CM

## 2020-04-08 DIAGNOSIS — R112 Nausea with vomiting, unspecified: Secondary | ICD-10-CM

## 2020-04-08 DIAGNOSIS — E7800 Pure hypercholesterolemia, unspecified: Secondary | ICD-10-CM

## 2020-04-08 DIAGNOSIS — D6862 Lupus anticoagulant syndrome: Secondary | ICD-10-CM

## 2020-04-08 DIAGNOSIS — K436 Other and unspecified ventral hernia with obstruction, without gangrene: Secondary | ICD-10-CM

## 2020-04-08 DIAGNOSIS — K432 Incisional hernia without obstruction or gangrene: Secondary | ICD-10-CM

## 2020-04-08 NOTE — Other
Maria [name] is a 69 yo for REPAIR COMPLEX VENTRAL HERNIA WITH MESH (N/A ) ?? ?IMPLANTATION, MESH/PROSTHESIS, INCISIONAL/VENTRAL HERNIA REPAIR (N/A )Anesthesia type: GENERALLast surgery scheduled 04/04/20 Hosp 12/4-12/10/21 for SBO, pt stated usually on warfarin but now on Lovenox because of surgery, Lupus anticoagulant-Heme note 10/12/21PONV (postoperative nausea and vomiting)			Medical HistoryDiagnosis	Date	Comment	SourceAcute ischemic colitis (HC Code) (HC CODE)	2015		Dental crown present		UPPER FRONT CAP	Depression		Receving treatment at [facility]	Diabetes mellitus (HC Code) (HC CODE)			Fibroid		2003	GERD (gastroesophageal reflux disease)		SINCE WT LOSS AND DIET CHANGE PT NO LONGER HAS PROBLEMS WITH GERD	Hypercholesteremia			Lupus anticoagulant disorder (HC Code) (HC CODE)			Rosacea			Ventral hernia with bowel obstruction			Vertigo			Surgical HistoryProcedure	Laterality	Date	Comment	SourceBLEPHAROPLASTY	Bilateral	05/2016		ABDOMINAL ADHESION SURGERY		2016		COLOSTOMY		05/27/13		KNEE LIGAMENT RECONSTRUCTION	Right	2004		COLOSTOMY CLOSURE				LAPAROSCOPIC NISSEN FUNDOPLICATION			2000 ?	SINUS SURGERY				THYROID BIOPSY			NEGATIVE PER PATIENT	TUBAL LIGATION				UMBILICAL HERNIA REPAIR				VENTRAL HERNIA REPAIR				Review NP: Jacqulyn Liner

## 2020-04-08 NOTE — Telephone Encounter
Spoke with Maria [name] today and she confirmed that she took her last dose of enoxaparin 80mg  this morning (12/13/2) and will be having surgery tomorrow (04/09/20).  She reports that she has been following bridging instructions.  She will call the clinic when she is discharged from the hospital.

## 2020-04-08 NOTE — Discharge Summary
Maria [name] [name]-YscMed/Surg Discharge SummaryPatient Data:  Patient Name: Maria [name] Admit date: 03/30/2020 Age: 69 y.o. Discharge date: 04/05/2020 DOB: [birth year]	 Discharge Attending Physician: Tacy Dura, MD  MRN: [id number]	 Discharged Condition: good PCP: Cena Benton, MD Disposition: Home  Principal Diagnosis: small bowel obstructionSecondary Diagnoses: DM, GERD, Hypercholesterolemia Issues to be Addressed Post Discharge: Issues to be Addressed Post Discharge:Surgery on 04/09/2020 with Dr. Vonzell Schlatter to hold home coumadin. Continue lovenox 100mg  daily from 12/11-12/13. Hold the morning of surgery. Relevant Medications on Discharge:New Medications: see belowPending Labs and Tests:  n/aFollow-up Information:McKenney, Clyda Greener, MD[address] [place] [phone] to1/06/2020 11:30 AM Future Appointments Date Time Provider Department Center 04/24/2020  4:45 PM [facility] MA NP [address] MAM2 [name] M[facility]MO [name] 04/29/2020 11:30 AM Lajuana Matte, MD [facility] Course: [name] Course: [name] is a 69 y.o. female with a hx of multiple prior abdominal surgeries including ex-lap sigmoidectomy and hartmann's procedure in 2015, now reversed. The patient's post-operative course has been c/b multiple prior SBO necessitating ex-lap and LOA in 2017. She presented to the ED on 03/30/2020 w/ 1 day h/o post-prandial abdominal pain, which worsened overnight. Denied any N/V. In the ED, AF and hemodynamically appropriate, w/ WBC 15.2. Metz abd demonstrated high grade SBO w/ transition point in anterior abdomen. She was made NPO with NGT in place, placed on IVF and admitted for conservative management and serial abdominal exams/AXR. On HD3; patient reported some flatus with improving abdominal pain. On HD4, NGT found clogged and was exchanged. On HD6, patient passing gas and small BM and abdomen softer on exam; SBO resolving on AXR. HD7, patient continuing to improved clinically, passing flatus and some diarrhea. She was scheduled for an elective surgery next week 04/09/2020 with Dr. [name]. She expressed concern over post menopausal bleeding and her home GYN recommended transvaginal US inpatient and outpatient follow-up on 04/29/2020. Transvaginal US preformed and Dr. Prescilla Sours will review the results with the patient.  On day of discharge 04/05/2020 [name] was stable for discharge home with plan to return for surgery on 04/09/2020. She will go home on lovenox until day of surgery rather than restarting and then stopping coumadin (for lupus anticoagulant). Prescription medications and follow-up appointments were provided to the patient and discussed during discharge teaching and are listed below. Inpatient Consultants and summary of recommendations:GYN: - Follow-up as planned with Dr. Prescilla Sours. Follow-up visit is already re-scheduled for 04/29/20 at 11:30 AMPertinent Procedures or Surgeries:   Planned surgery 12/14/2021Pertinent lab findings and test results: Objective: Recent Labs Lab 12/09/210445 12/09/210445 12/10/210341 12/10/210341 12/10/210645 WBC 11.9*  --  12.3*  --  11.9* HGB 12.8   < > 12.7   < > 13.7 HCT 40.20   < > 38.00   < > 41.40 PLT 302   < > 320   < > 331  < > = values in this interval not displayed.  Recent Labs Lab 12/09/210445 12/10/210341 12/10/210645 NEUTROPHILS 82.7* 78.4* 78.6*  Recent Labs Lab 12/09/210445 12/09/210748 12/10/210341 12/10/210341 12/10/210645 12/10/210752 12/10/211058 NA 137  --  137  --  138  --   --  K 3.5   < > 3.0*   < > 3.1*  --   --  CL 103   < > 104   < > 104  --   --  CO2 13*   < > 15*   < > 16*  --   --  BUN 8   < > 8   < >  9  --   --  CREATININE 0.50   < > 0.53   < > 0.53  --   --  GLU 82   < > 120*   < > 119*   < > 146* ANIONGAP 21* < > 18*   < > 18*  --   --   < > = values in this interval not displayed.  Recent Labs Lab 12/09/210445 12/09/210445 12/10/210341 12/10/210341 12/10/210645 CALCIUM 8.6*  --  7.7*  --  9.0 MG 1.8   < > 1.7   < > 2.1 PHOS 1.8*   < > 2.2   < > 2.4  < > = values in this interval not displayed.  Recent Labs Lab 12/04/210342 ALT 20 AST 26 ALKPHOS 141* BILITOT 0.2  Recent Labs Lab 12/04/210342 12/05/210617 PTT 34.6* 38.3* LABPROT 18.0* 19.3* INR 1.75* 1.89*  Culture Information:No results for input(s): LABBLOO, LABURIN, LOWERRESPIRA in the last 168 hours.Imaging: Imaging results last 1 week:  XR Chest PA or APResult Date: 12/5/2021Enteric tube terminates in the gastric fundus. No acute cardiopulmonary abnormality. Reported And Signed By: Levonne Spiller, MD  [facility] Radiology and Biomedical ImagingXR Chest PA or APResult Date: 12/5/2021Enteric tube terminates in the gastric fundus. No acute cardiopulmonary abnormality. Reported And Signed By: Levonne Spiller, MD  [facility] Radiology and Biomedical ImagingXR Chest PA or AP (Portable)Result Date: 12/4/2021Enteric tube directed upwards in the gastric fundus. Reported And Signed By: Blinda Leatherwood, MD  [name] Radiology and Biomedical ImagingXR Abdomen APResult Date: 12/9/2021Contrast is again seen in the right and also distal colon. Mildly dilated small bowel loops likely represent resolving obstruction but follow-up would be helpful. Reported And Signed By: Jerilynn Som, MD  [name] Radiology and Biomedical ImagingXR Abdomen APResult Date: 12/8/2021Resolving small bowel obstruction. Report Initiated By:  Assunta Found, MD Reported And Signed By: Jerilynn Som, MD  [facility] Radiology and Biomedical ImagingXR Abdomen APResult Date: 12/6/2021mpression: Nasogastric tube is looped in the stomach with tip appearing in the region of the gastroesophageal junction. Persistent dilated small bowel with intraluminal contrast is visualized. Reported And Signed By: Denton Ar, MD  [facility] Radiology and Biomedical ImagingXR Abdomen APResult Date: 12/6/2021Persistent small bowel obstruction. Report Initiated By:  Assunta Found, MD Reported And Signed By: Liliane Channel, MD  [facility] Radiology and Biomedical ImagingCT Abdomen Pelvis w IV Contrast (no oral contrast)Result Date: 12/4/2021High-grade small bowel obstruction with transition point in the mid anterior abdomen. No pneumoperitoneum or conspicuous decrease in mucosal enhancement. Report Initiated By:  Keane Scrape, MD Reported And Signed By: Elray Buba, MD  [name] Radiology and Biomedical ImagingUS Non-OB Transvaginal with Limited DopplerResult Date: 12/10/20216 mm round echogenic lesion in the left uterine endometrium with the level of the cornua concerning for a polyp. Underlying neoplasm cannot be excluded. Tissue biopsy is recommended. Reported And Signed By: Dulce Sellar, MD  [facility] Radiology and Biomedical ImagingDiet:  Full liquid dietMobility: Highest Level of mobility - ACTUAL: Mobility Level 7, Walk 25+ feet, AM PAC 22-23  Physical Exam Discharge vitals: Temp:  [97.3 ?F (36.3 ?C)-98.6 ?F (37 ?C)] 97.3 ?F (36.3 ?C)Pulse:  [81-111] 81Resp:  [18-20] 20BP: (120-164)/(73-93) 132/76SpO2:  [98 %-100 %] 100 %Device (Oxygen Therapy): room air Cognitive Status at Discharge: BaselineDischarge Physical Exam:Physical ExamGEN: Well appearingNEURO: AOx4, nonfocalCV: RRPULM: Unlabored on room airGI: Soft, nontenderAllergies Allergies Allergen Reactions  Morphine Hives and Itching   Pt tolerated morphine during admission 08/08/16-08/09/16.  Pt premedicated with diphenhydramine  Biaxin  [Clarithromycin]   Latex, Natural Rubber Rash  PMH PSH Past Medical History: Diagnosis Date  Acute ischemic colitis (HC Code) (HC CODE) 2015 Dental crown present   UPPER FRONT CAP  Depression   Receving treatment at [facility]  Diabetes mellitus (HC Code) (HC CODE)   Fibroid   2003  GERD (gastroesophageal reflux disease)   SINCE WT LOSS AND DIET CHANGE PT NO LONGER HAS PROBLEMS WITH GERD  Hypercholesteremia   PONV (postoperative nausea and vomiting)   Rosacea   Ventral hernia with bowel obstruction   Vertigo   Past Surgical History: Procedure Laterality Date  ABDOMINAL ADHESION SURGERY  2016  BLEPHAROPLASTY Bilateral 05/2016  COLOSTOMY  05/27/13  COLOSTOMY CLOSURE    KNEE LIGAMENT RECONSTRUCTION Right 2004  LAPAROSCOPIC NISSEN FUNDOPLICATION    2000 ?  SINUS SURGERY    THYROID BIOPSY    NEGATIVE PER PATIENT  TUBAL LIGATION    UMBILICAL HERNIA REPAIR    VENTRAL HERNIA REPAIR    Social History Family History Social History Tobacco Use  Smoking status: Never Smoker  Smokeless tobacco: Never Used Substance Use Topics  Alcohol use: Yes   Comment: once or twice a year  Family History Problem Relation Age of Onset  Cancer Mother       bladder cancer  Bladder cancer Mother   Colon cancer Paternal Aunt   Colon cancer Cousin       died of colona cancer at age 49  Breast cancer Cousin       2 cousins with breast cancer  Colon cancer Father   Cancer Sister   Discharge Medications: Discharge: Current Discharge Medication List  CONTINUE these medications which have CHANGED  Details enoxaparin (LOVENOX) 100 mg/mL syringe Inject 1 mL (100 mg total) under the skin daily for 2 days.Qty: 2 mL, Refills: 0Start date: 04/06/2020, End date: 04/08/2020   CONTINUE these medications which have NOT CHANGED  Details acetaminophen (TYLENOL) 500 mg tablet Take 1 tablet (500 mg total) by mouth every 4 (four) hours as needed.Qty: 30 tablet, Refills: 11  ascorbic acid (VITAMIN C ORAL) Take 1 tablet by mouth daily.  ascorbic acid/collagen hydr (COLLAGEN PLUS VITAMIN C ORAL) Take 1 tablet by mouth every other day.[name]  BIOTIN ORAL Take 1 tablet by mouth every other day.  cholecalciferol, vitamin D3, (VITAMIN D3) 5,000 unit Tab Take 1 tablet by mouth every other day..  gabapentin (NEURONTIN) 100 mg capsule Take 100 mg by mouth at bedtime.  JARDIANCE 25 mg tablet 1 TABLET BY MOUTH ONCE DAILY IN THE MORNING FOR 30 DAYS  meclizine (ANTIVERT) 12.5 mg tablet Take 1 tablet (12.5 mg total) by mouth daily as needed for DizzinessQty: 30 tablet, Refills: 1  Associated Diagnoses: Vertigo  melatonin 10 mg Tab Take 10 mg by mouth nightly as needed.   multivitamin tablet Take 1 tablet by mouth daily.  olopatadine (PATADAY) 0.2 % Drop Place 1 drop into both eyes dailyQty: 2.5 mL, Refills: 6  Associated Diagnoses: Allergic conjunctivitis of both eyes  ondansetron (ZOFRAN) 4 MG tablet Take 4 mg by mouth every 8 (eight) hours as needed for Nausea.  phytonadione, vit K1, (VITAMIN K) 100 mcg tablet Take 100 mcg by mouth daily.  polyethylene glycol (MIRALAX) 17 gram packet Take 1 packet (17 g total) by mouth daily. Mix in 8 ounces of water, juice, soda, coffee or tea prior to taking.Qty: 14 each, Refills: 2  prasterone, dhea,-calcium carb (DHEA) 10 mg-47 mg calcium Tab Take by mouth.  pravastatin (PRAVACHOL) 40 MG tablet Take 40 mg by mouth nightly.  senna (  SENOKOT) 8.6 mg tablet Take 2 tablets by mouth nightly.Rob Bunting: 30 tablet, Refills: 11  Associated Diagnoses: SBO (small bowel obstruction) (HC Code) (HC CODE)  sitaGLIPtin (JANUVIA) 100 MG tablet Take 100 mg by mouth daily..  sodium chloride (SODIUM CHLORIDE) 0.65 % nasal spray 1 spray by Each Nare route as needed for Congestion.[name]   venlafaxine (EFFEXOR XR) 150 mg XR 24 hr extended release capsule Take 150 mg by mouth daily.   ZINC ORAL Take by mouth daily.    STOP taking these medications   warfarin (COUMADIN) 5 mg tablet Electronically Signed:Lina Starovoitova, PA 04/05/2020

## 2020-04-09 ENCOUNTER — Inpatient Hospital Stay
Admit: 2020-04-09 | Discharge: 2020-04-14 | Payer: PRIVATE HEALTH INSURANCE | Source: Home / Self Care | Admitting: Surgery

## 2020-04-09 ENCOUNTER — Ambulatory Visit: Admit: 2020-04-09 | Payer: PRIVATE HEALTH INSURANCE | Attending: Anesthesiology | Primary: Internal Medicine

## 2020-04-09 DIAGNOSIS — K432 Incisional hernia without obstruction or gangrene: Secondary | ICD-10-CM

## 2020-04-09 MED ORDER — ZZ IMS TEMPLATE
Freq: Every day | SUBCUTANEOUS | Status: DC
Start: 2020-04-09 — End: 2020-04-14
  Administered 2020-04-10 – 2020-04-14 (×5): 100 mL via SUBCUTANEOUS

## 2020-04-09 MED ORDER — BUPIVACAINE (PF) 0.5 % (5 MG/ML) INJECTION SOLUTION
0.5 % (5 mg/mL) | Status: DC | PRN
Start: 2020-04-09 — End: 2020-04-09
  Administered 2020-04-09: 16:00:00 0.5 % (5 mg/mL)

## 2020-04-09 MED ORDER — MIDAZOLAM (PF) 1 MG/ML INJECTION SOLUTION
1 mg/mL | INTRAVENOUS | Status: DC | PRN
Start: 2020-04-09 — End: 2020-04-09
  Administered 2020-04-09 (×2): 1 mg/mL via INTRAVENOUS

## 2020-04-09 MED ORDER — DEXMEDETOMIDINE 100 MCG/ML INTRAVENOUS SOLUTION
100 mcg/mL | Status: CP
Start: 2020-04-09 — End: ?

## 2020-04-09 MED ORDER — LIDOCAINE (PF) 20 MG/ML (2 %) INJECTION SOLUTION
20 mg/mL (2 %) | INTRAVENOUS | Status: DC | PRN
Start: 2020-04-09 — End: 2020-04-09
  Administered 2020-04-09: 13:00:00 20 mg/mL (2 %) via INTRAVENOUS

## 2020-04-09 MED ORDER — ONDANSETRON HCL (PF) 4 MG/2 ML INJECTION SOLUTION
42 mg/2 mL | INTRAVENOUS | Status: DC | PRN
Start: 2020-04-09 — End: 2020-04-09

## 2020-04-09 MED ORDER — FENTANYL (PF) 50 MCG/ML INJECTION SOLUTION
50 mcg/mL | Status: CP
Start: 2020-04-09 — End: ?

## 2020-04-09 MED ORDER — INSULIN LISPRO 100 UNIT/ML (SLIDING SCALE)
100 unit/mL | Freq: Four times a day (QID) | SUBCUTANEOUS | Status: DC
Start: 2020-04-09 — End: 2020-04-11
  Administered 2020-04-10 (×2): 100 mL via SUBCUTANEOUS

## 2020-04-09 MED ORDER — DIMENHYDRINATE 5 MG/ML IN 0.9% SODIUM CHLORIDE
INTRAVENOUS | Status: DC | PRN
Start: 2020-04-09 — End: 2020-04-09

## 2020-04-09 MED ORDER — PROPOFOL 10 MG/ML INTRAVENOUS EMULSION
10 mg/mL | Status: CP
Start: 2020-04-09 — End: ?

## 2020-04-09 MED ORDER — ONDANSETRON HCL (PF) 4 MG/2 ML INJECTION SOLUTION
4 mg/2 mL | INTRAVENOUS | Status: DC | PRN
Start: 2020-04-09 — End: 2020-04-09
  Administered 2020-04-09 (×2): 4 mg/2 mL via INTRAVENOUS

## 2020-04-09 MED ORDER — SODIUM CHLORIDE 0.9 % IRRIGATION SOLUTION
0.9 % irrigation | Status: CP | PRN
Start: 2020-04-09 — End: ?
  Administered 2020-04-09 (×2): 0.9 % irrigation

## 2020-04-09 MED ORDER — ROCURONIUM 10 MG/ML INTRAVENOUS SOLUTION
10 mg/mL | Status: CP
Start: 2020-04-09 — End: ?

## 2020-04-09 MED ORDER — HYDROMORPHONE 2 MG/ML INJECTION SOLUTION
2 mg/mL | Status: CP
Start: 2020-04-09 — End: ?

## 2020-04-09 MED ORDER — FENTANYL (PF) 50 MCG/ML INJECTION SOLUTION
50 mcg/mL | INTRAVENOUS | Status: DC | PRN
Start: 2020-04-09 — End: 2020-04-09
  Administered 2020-04-09 (×2): 50 mcg/mL via INTRAVENOUS

## 2020-04-09 MED ORDER — FRUIT JUICE
ORAL | Status: DC | PRN
Start: 2020-04-09 — End: 2020-04-14

## 2020-04-09 MED ORDER — DEXTROSE 50 % IN WATER (D50W) INTRAVENOUS SYRINGE
INTRAVENOUS | Status: DC | PRN
Start: 2020-04-09 — End: 2020-04-14

## 2020-04-09 MED ORDER — SUGAMMADEX 100 MG/ML INTRAVENOUS SOLUTION
100 mg/mL | INTRAVENOUS | Status: DC | PRN
Start: 2020-04-09 — End: 2020-04-09
  Administered 2020-04-09 (×2): 100 mg/mL via INTRAVENOUS

## 2020-04-09 MED ORDER — GLUCAGON 1 MG/ML IN STERILE WATER
Freq: Once | INTRAMUSCULAR | Status: DC | PRN
Start: 2020-04-09 — End: 2020-04-14

## 2020-04-09 MED ORDER — SODIUM CHLORIDE 0.9 % (FLUSH) INJECTION SYRINGE
0.9 % | Freq: Three times a day (TID) | INTRAVENOUS | Status: DC
Start: 2020-04-09 — End: 2020-04-09

## 2020-04-09 MED ORDER — HYDROMORPHONE (DILAUDID) PCA 1 MG/ML (50 ML) YNH PYXIS
1 mg/ml | INTRAVENOUS | Status: DC
Start: 2020-04-09 — End: 2020-04-12
  Administered 2020-04-09: 17:00:00 1 mL/h via INTRAVENOUS

## 2020-04-09 MED ORDER — LACTATED RINGERS INTRAVENOUS SOLUTION
INTRAVENOUS | Status: DC | PRN
Start: 2020-04-09 — End: 2020-04-09
  Administered 2020-04-09 (×2): via INTRAVENOUS

## 2020-04-09 MED ORDER — BUPIVACAINE (PF) 0.5 % (5 MG/ML) INJECTION SOLUTION
0.5 % (5 mg/mL) | Status: CP
Start: 2020-04-09 — End: ?

## 2020-04-09 MED ORDER — SKIM MILK
ORAL | Status: DC | PRN
Start: 2020-04-09 — End: 2020-04-14

## 2020-04-09 MED ORDER — DEXMEDETOMIDINE 100 MCG/ML INTRAVENOUS SOLUTION
100 mcg/mL | INTRAVENOUS | Status: DC | PRN
Start: 2020-04-09 — End: 2020-04-09
  Administered 2020-04-09 (×10): 100 mcg/mL via INTRAVENOUS

## 2020-04-09 MED ORDER — CHLORHEXIDINE GLUCONATE 0.12 % MOUTHWASH
0.12 % | Freq: Once | OROMUCOSAL | Status: CP
Start: 2020-04-09 — End: ?
  Administered 2020-04-09: 12:00:00 0.12 mL via OROMUCOSAL

## 2020-04-09 MED ORDER — OXYCODONE IMMEDIATE RELEASE 5 MG TABLET
5 mg | ORAL | Status: DC | PRN
Start: 2020-04-09 — End: 2020-04-09

## 2020-04-09 MED ORDER — HYDROMORPHONE 2 MG/ML INJECTION SOLUTION
2 mg/mL | INTRAVENOUS | Status: DC | PRN
Start: 2020-04-09 — End: 2020-04-09
  Administered 2020-04-09 (×2): 2 mg/mL via INTRAVENOUS

## 2020-04-09 MED ORDER — PROPOFOL 10 MG/ML INTRAVENOUS EMULSION
10 mg/mL | INTRAVENOUS | Status: DC | PRN
Start: 2020-04-09 — End: 2020-04-09
  Administered 2020-04-09: 13:00:00 10 mg/mL via INTRAVENOUS

## 2020-04-09 MED ORDER — CEFAZOLIN 1 GRAM SOLUTION FOR INJECTION
1 gram | Status: CP
Start: 2020-04-09 — End: ?

## 2020-04-09 MED ORDER — NALOXONE 0.4 MG/ML INJECTION SOLUTION
0.4 mg/mL | INTRAVENOUS | Status: DC | PRN
Start: 2020-04-09 — End: 2020-04-14

## 2020-04-09 MED ORDER — DIAZEPAM 5 MG/ML INJECTION SYRINGE
5 mg/mL | Freq: Three times a day (TID) | INTRAVENOUS | Status: DC | PRN
Start: 2020-04-09 — End: 2020-04-14

## 2020-04-09 MED ORDER — DIPHENHYDRAMINE 50 MG/ML INJECTION SOLUTION
50 mg/mL | INTRAVENOUS | Status: DC | PRN
Start: 2020-04-09 — End: 2020-04-09

## 2020-04-09 MED ORDER — FENTANYL (PF) 50 MCG/ML INJECTION SOLUTION
50 mcg/mL | INTRAVENOUS | Status: DC | PRN
Start: 2020-04-09 — End: 2020-04-09

## 2020-04-09 MED ORDER — MIDAZOLAM (PF) 1 MG/ML INJECTION SOLUTION
1 mg/mL | Status: CP
Start: 2020-04-09 — End: ?

## 2020-04-09 MED ORDER — CEFAZOLIN 1 GRAM SOLUTION FOR INJECTION
1 gram | INTRAVENOUS | Status: DC | PRN
Start: 2020-04-09 — End: 2020-04-09
  Administered 2020-04-09: 13:00:00 1 gram via INTRAVENOUS

## 2020-04-09 MED ORDER — ACETAMINOPHEN 1,000 MG/100 ML (10 MG/ML) INTRAVENOUS SOLUTION
10 mg/mL | INTRAVENOUS | Status: DC | PRN
Start: 2020-04-09 — End: 2020-04-09
  Administered 2020-04-09: 15:00:00 10 mg/mL via INTRAVENOUS

## 2020-04-09 MED ORDER — SODIUM CHLORIDE 0.9 % (FLUSH) INJECTION SYRINGE
0.9 % | INTRAVENOUS | Status: DC | PRN
Start: 2020-04-09 — End: 2020-04-09

## 2020-04-09 MED ORDER — DEXTROSE 40 % ORAL GEL
40 % | ORAL | Status: DC | PRN
Start: 2020-04-09 — End: 2020-04-14

## 2020-04-09 MED ORDER — ROCURONIUM 10 MG/ML INTRAVENOUS SOLUTION
10 mg/mL | INTRAVENOUS | Status: DC | PRN
Start: 2020-04-09 — End: 2020-04-09
  Administered 2020-04-09 (×2): 10 mg/mL via INTRAVENOUS

## 2020-04-09 MED ORDER — LACTATED RINGERS INTRAVENOUS SOLUTION
INTRAVENOUS | Status: DC
Start: 2020-04-09 — End: 2020-04-12
  Administered 2020-04-09 – 2020-04-12 (×7): 1000.000 mL/h via INTRAVENOUS

## 2020-04-09 MED ORDER — PROPOFOL 10 MG/ML INTRAVENOUS EMULSION
10 mg/mL | INTRAVENOUS | Status: DC | PRN
Start: 2020-04-09 — End: 2020-04-09
  Administered 2020-04-09: 13:00:00 10 mL/h via INTRAVENOUS

## 2020-04-09 MED ORDER — HYDROMORPHONE (DILAUDID) PCA 1 MG/ML (50 ML) YNH PYXIS
1 mg/ml | Status: CP
Start: 2020-04-09 — End: ?

## 2020-04-09 MED ORDER — DEXAMETHASONE SODIUM PHOSPHATE 4 MG/ML INJECTION SOLUTION
4 mg/mL | INTRAVENOUS | Status: DC | PRN
Start: 2020-04-09 — End: 2020-04-09
  Administered 2020-04-09: 13:00:00 4 mg/mL via INTRAVENOUS

## 2020-04-09 MED ORDER — LIDOCAINE (PF) 20 MG/ML (2 %) INTRAVENOUS SOLUTION
20 mg/mL (2 %) | Status: CP
Start: 2020-04-09 — End: ?

## 2020-04-09 MED ORDER — FENTANYL (PF) 50 MCG/ML INJECTION SOLUTION
50 mcg/mL | INTRAVENOUS | Status: DC | PRN
Start: 2020-04-09 — End: 2020-04-09
  Administered 2020-04-09 (×2): 50 mL via INTRAVENOUS

## 2020-04-09 MED ORDER — CHLORHEXIDINE GLUCONATE 0.12 % MOUTHWASH
0.12 % | Status: CP
Start: 2020-04-09 — End: ?

## 2020-04-09 NOTE — Other
Post Anesthesia Transfer of Care NotePatient: Maria RiveraProcedure(s) Performed: Procedure(s) (LRB):REPAIR COMPLEX VENTRAL HERNIA WITH MESH (N/A)IMPLANTATION, MESH/PROSTHESIS, INCISIONAL/VENTRAL HERNIA REPAIR (N/A) Patient location: PACU Last Vitals: Vitals Value Taken Time BP 121/61 04/09/20 1124 Temp 36.4 ?C 04/09/20 1124 Pulse 66 04/09/20 1125 Resp 10 04/09/20 1125 SpO2 100 % 04/09/20 1125 Vitals shown include unvalidated device data.Level of consciousness: awake, alert  and orientedTransport Vital Signs:  Stable since the last set of recorded intra-operative vital signsComplications: noneIntra-operative Intake & Output and Antibiotics as per Anesthesia record and discussed with the RN.

## 2020-04-09 NOTE — Anesthesia Pre-Procedure Evaluation
This is a 69 y.o. female scheduled for REPAIR COMPLEX VENTRAL HERNIA WITH MESH (N/A )IMPLANTATION, MESH/PROSTHESIS, INCISIONAL/VENTRAL HERNIA REPAIR (N/A ).Review of Systems/ Medical HistoryPatient summary, nursing notes, EKG/Cardiac Studies , Labs, pre-procedure vitals, height, weight and NPO status reviewed.No previous anesthesia concernsAnesthesia Evaluation:   History of anesthetic complications: Hx of PONV.  Perioperative Screening ToolPONV Screening risk factors: demonstrated PONV.Estimated body mass index is 30.7 kg/m? as calculated from the following:  Height as of this encounter: 4' 11 (1.499 m).  Weight as of this encounter: 68.9 kg. CC/HPI: Maria [name] with complicated abdominal history and anatomy. Admitted 12/4-12/10/21 for SBO. Resolved. Now for ventral hernia repair.Past Surgical History:  Knee ligament reconstructionUmbilical herniaTLColostomyLysis of abdominal adhesionsLap NISSENVentral hernia repairThyroid biopsySinus surgeryblepharoplastyCardiovascular:Patient has a history of: hypercholesterolemia. -Exercise tolerance: >4 METS -Dysrhythmia(s): no dysrhythmias-Vascular Disease:  peripheral arterial disease (ischemic colitis).   Respiratory: The patient had a no recent URI -Airway disorders: -Asthma: noGastrointestinal/Genitourinary: -Gastrointestinal Disorders:  Patient has hiatal hernia. Patient has GERD. Her GERD is well controlled.-Nutritional Disorders: Patient has has increased body weightHematological/Lymphatic: -Hematopoietic Conditions:  Patient has hypercoagulable state (off coumadin 1 week. Lovenox bridge. Last dose yesterday.).  She has anticardiolipin. Endocrine/Metabolic: -Diabetes mellitus:  Patient has diabetes mellitus type 2. Her diabetes is well controlled.Behavioral/Psychiatric & Syndromes:  Patient has depression.Physical ExamCardiovascular:    normal exam  Pulmonary: normal exam  Airway:  Mallampati: IITM distance: >3 FBNeck ROM: fullDental:  Dentition: caps and crownAnesthesia PlanASA 3 The primary anesthesia plan is  general ETT. Perioperative Code Status confirmed: It is my understanding that the patient is currently designated as 'Full Code' and will remain so throughout the perioperative period.Anesthesia informed consent obtained. Consent obtained from: patientUse of blood products: consented  Plan discussed with CRNA.Anesthesiologist's Pre Op NoteI personally evaluated and examined the patient prior to the intra-operative phase of care.

## 2020-04-09 NOTE — Other
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]Procedure Done: * No post-op diagnosis entered *Any Significant Events Intra-Op: noneAbnormal Assessment in PACU: noneLevel of Consciousness: awake, alert  and orientedLast Set of VS:  Vitals:  04/09/20 0615 04/09/20 1124 04/09/20 1130 04/09/20 1145 BP: (!) 143/66 121/61 (!) 122/59 125/62 Pulse: 78 68 66 66 Resp: 18 (!) 10 (!) 10 (!) 11 Temp: 98 ?F (36.7 ?C) 97.6 ?F (36.4 ?C)   TempSrc: Temporal Temporal   SpO2: 99% 100% 99% 100% Weight: 68.9 kg    Height: 4' 11 (1.499 m)    Device (Oxygen Therapy): nasal cannula O2 Flow (L/min): 2Baseline Neuro/developmental Status: WDLLabs Collected: N/ASpecial Needs of the Patient: noneAntibiotics: last dose given in OR: 2 GM ANCEFIV Access: Periph IV-single(adult) 04/04/20 2230 median cubital(antecubital fossa), right over-the-needle catheter system 21 gauge Nurse (Active)   Periph IV-single(adult) 04/09/20 1130 median cubital(antecubital fossa), left over-the-needle catheter system 20 gauge  (Active) SiteCare/Dressing Status/Securement dressing dry and intact 04/09/20 1124 Date Dressing Applied/Changed 04/09/20 04/09/20 1124 Next Date Dressing Change 04/16/2020 04/09/20 1124 Lumen 1 Patency/Care Patent;flushed w/o difficulty;blood return present 04/09/20 1124 Site Signs asymptomatic with no redness, no swelling, no drainage 04/09/20 1124 Phlebitis 0-->no symptoms 04/09/20 1124 Patient Education Instructed to call nurse if site is painful,red,swollen, burning;Instructed to call nurse if fluid leaking from site;Instructed to keep IV site dry 04/09/20 1124 Daily Review of Necessity ** completed 04/09/20 1124 IV Fluids: ? HYDROmorphone   ? lactated Ringers 125 mL/hr (04/09/20 1136) Pain Assessment: Number Scale (0-10) 04/09/20 1130 incisional abdomen-Pain Rating (0-10): Rest: 4Pain Assessment: Number Scale (0-10) 04/09/20 1130 incisional abdomen-Pain Rating (0-10): Activity: 4   Wound 04/09/20 1053 Incision abdomen-Incision Closure : unable to assess  Body Position: supine, independentHead of Bed (HOB) Positioning: HOB at 30-45 degrees   Time of Last Void or Time that Urinary Catheter was Removed Intra-Op: catheter in place Additional Info: Received pt aprox 1130 s/p complex ventral hernia with mesh repair,  Implantation of mesh/prosthesiws, incisional/ventral hernia repair. Pmhx acute ischemic colitis, depression, DM, fibroid, GERD,  High cholesterol, lupus, rosacea, ventral hernia with bowel obstruction, vertia, bil blepharoplasty, colostomy, abd adhesion surgery, knee ligament reconstruction-right, colostomy closure, lap nissen fundoplication, tubal ligation, umbilical hernia and ventral hernia repair. Pt hospitalized 12/4-12/10 for SBO, pt usually on warfarin but on lovenox d/t surgery (on a/c lupus anticoag). Pt to pacu 1130, started on dilaudid pca, lr at 120/hr, foley already in place from OR. Pt assessment is as follows:CV-cm tracing shows sr rate in 70s, x1 piv, afebril,m bp 122/67.RESP-2 liters nc.sats wnl, ls cta.GI/GU-abd binder in place, has midline dressing covered with gauze, transparent film, foley in place draining clear yellow urine, 40/hr.PAIN-fent 50 mcg with good effect,m placed on dilaudid pca. SKIN-no areas of breakdown.PLAN-tx to floor.Addendum-titrate to rm air, sats upper 90s, ls cta Vss. Pain well controlled, surg site dressing c,d,intact.Contact RN (name and phone number)  Jacquelyne Balint, RN

## 2020-04-09 NOTE — Plan of Care
Problem: Adult Inpatient Plan of CareGoal: Plan of Care ReviewOutcome: Interventions implemented as appropriateGoal: Patient-Specific Goal (Individualized)Outcome: Interventions implemented as appropriateGoal: Absence of Hospital-Acquired Illness or InjuryOutcome: Interventions implemented as appropriateGoal: Optimal Comfort and WellbeingOutcome: Interventions implemented as appropriateGoal: Readiness for Transition of CareOutcome: Interventions implemented as appropriate Problem: Impaired Wound HealingGoal: Optimal Wound HealingOutcome: Interventions implemented as appropriate Plan of Care Overview/ Patient Status    Patient arrived to floor A&Ox4, VSS BP a little high. Room air. Pain managed with dilaudid PCA with good effect. Foley intact & draining clear yellow. Foley care provided. 2 nurse skin check completed. Midline incision covered with guaze and transparent dressing CDI. Abdominal binder in place. Skin otherwise intact. Patient npo. Stand by oob in room. See flow sheets for further details.

## 2020-04-09 NOTE — Other
POST OP CHECK Maria [name] is a 69 y.o. female patient POD#0 s/p complex ventral hernia repairPrinciple Diagnosis: recurrent ventral herniaPast Medical History: Diagnosis Date ? Acute ischemic colitis (HC Code) (HC CODE) 2015 ? Dental crown present   UPPER FRONT CAP ? Depression   Receving treatment at [facility] ? Diabetes mellitus (HC Code) (HC CODE)  ? Fibroid   2003 ? GERD (gastroesophageal reflux disease)   SINCE WT LOSS AND DIET CHANGE PT NO LONGER HAS PROBLEMS WITH GERD ? Hypercholesteremia  ? 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 PONV (postoperative nausea and vomiting)  ? Rosacea  ? Ventral hernia with bowel obstruction  ? Vertigo  No past surgical history pertinent negatives on file.Subjective:Afebrile, no acute events- denies nausea, chest pain, dyspnea, palpitations- moderate complaints of pain 7/10, dilaudid pca initiatedObjective:Temp:  [97.5 ?F (36.4 ?C)-98 ?F (36.7 ?C)] 97.5 ?F (36.4 ?C)Pulse:  [63-84] 78Resp:  [10-18] 17BP: (115-144)/(57-71) 127/70SpO2:  [93 %-100 %] 99 %Device (Oxygen Therapy): room airO2 Flow (L/min):  [2] 2Intake/Output Summary (Last 24 hours) at 04/09/2020 1530Last data filed at 04/09/2020 1400Gross per 24 hour Intake 1212.5 ml Output 605 ml Net 607.5 ml Results in Past 7 DaysResult Component Current Result Hematocrit 41.40 (04/05/2020) Hemoglobin 13.7 (04/05/2020) MCH 26.5 (L) (04/05/2020) MCHC 33.1 (04/05/2020) MCV 80.1 (04/05/2020) MPV 11.1 (04/05/2020) Platelets 331 (04/05/2020) RBC 5.17 (04/05/2020) WBC 11.9 (H) (04/05/2020) Lab Results Component Value Date  CREATININE 0.53 04/05/2020  BUN 9 04/05/2020  NA 138 04/05/2020  K 3.1 (L) 04/05/2020  CL 104 04/05/2020  CO2 16 (L) 04/05/2020 Physical Exam: Gen: NAD, resting comfortably in bedHeent: unremarkable Lungs: CTAB on 2L NCChest: RRRAbd: soft, attp, nondistended, incisions CDI, abdominal binder intactGU: foley with clear yellow urineExt: WWP, moving all extremities, venodynes in place, no edema notedNeuro: AOX4A/P:Maria [name] is a 69 y.o. female patient POD#0 s/p complex ventral hernia repair for recurrent ventral hernia. Patient is post operatively stable.Diet: NPOFluids: MIVFPain control: dPCAMonitor I/O Q4HMonitor vital signs Q4HAM LABSDVT/PE PPX: Lovenox, SCD'SOOB/Ambulate/ISFoley to remain until POD1Consults: Physical therapy Dispo: pending clinical course Appreciate Nursing CareMichael Maria [name], APRNDepartment of SurgeryPager: [phone]

## 2020-04-09 NOTE — Interval H&P Note
Subsequent to admission for surgery or invasive procedure, I have reassessed the patient by examination and review of relevant data pertaining to the planned procedure. Patient without complaint  moving bowels  PE  Abd soft and nontender  CTA  RR  A/P lovenox yesterday none today  for open ventral hernia repair Neomia Dear and Deniece Portela

## 2020-04-09 NOTE — Utilization Review (ED)
UM Status: Managed Medicare IP, inpatient order + class confirmed.

## 2020-04-09 NOTE — Other
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

## 2020-04-09 NOTE — Interval H&P Note
Subsequent to admission for surgery or invasive procedure, I have reassessed the patient by examination and review of relevant data pertaining to the planned procedure. I have verified the planned procedure and there are no relevant changes since the H&P.Patient has been doing well since discharge. Presenting today for elective hernia repair and lysis of adhesions.PHYSICAL EXAMGEN: Awake, alert, oriented x 3 with normal speech and mentationNEURO: is nonfocalCV: Regular rate, no MRGPULM: Unlabored on room air, CTABL

## 2020-04-10 ENCOUNTER — Encounter: Admit: 2020-04-10 | Payer: PRIVATE HEALTH INSURANCE | Attending: Surgery | Primary: Internal Medicine

## 2020-04-10 DIAGNOSIS — Z98811 Dental restoration status: Secondary | ICD-10-CM

## 2020-04-10 DIAGNOSIS — R112 Nausea with vomiting, unspecified: Secondary | ICD-10-CM

## 2020-04-10 DIAGNOSIS — K436 Other and unspecified ventral hernia with obstruction, without gangrene: Secondary | ICD-10-CM

## 2020-04-10 DIAGNOSIS — K55039 Acute (reversible) ischemia of large intestine, extent unspecified: Secondary | ICD-10-CM

## 2020-04-10 DIAGNOSIS — E7800 Pure hypercholesterolemia, unspecified: Secondary | ICD-10-CM

## 2020-04-10 DIAGNOSIS — D6862 Lupus anticoagulant syndrome: Secondary | ICD-10-CM

## 2020-04-10 DIAGNOSIS — R42 Dizziness and giddiness: Secondary | ICD-10-CM

## 2020-04-10 DIAGNOSIS — L719 Rosacea, unspecified: Secondary | ICD-10-CM

## 2020-04-10 DIAGNOSIS — E119 Type 2 diabetes mellitus without complications: Secondary | ICD-10-CM

## 2020-04-10 DIAGNOSIS — D219 Benign neoplasm of connective and other soft tissue, unspecified: Secondary | ICD-10-CM

## 2020-04-10 DIAGNOSIS — K219 Gastro-esophageal reflux disease without esophagitis: Secondary | ICD-10-CM

## 2020-04-10 DIAGNOSIS — F32A Depression: Secondary | ICD-10-CM

## 2020-04-10 LAB — BASIC METABOLIC PANEL
BKR ANION GAP: 9 g/dL (ref 7–17)
BKR BLOOD UREA NITROGEN: 10 mg/dL (ref 8–23)
BKR BUN / CREAT RATIO: 17.9 % — ABNORMAL LOW (ref 8.0–23.0)
BKR CALCIUM: 9.1 mg/dL — CR (ref 8.8–10.2)
BKR CHLORIDE: 100 mmol/L (ref 98–107)
BKR CO2: 28 mmol/L — ABNORMAL LOW (ref 20–30)
BKR CREATININE: 0.56 mg/dL (ref 0.40–1.30)
BKR EGFR (AFR AMER): >60 mL/min/{1.73_m2} (ref >60)
BKR EGFR (NON AFRICAN AMERICAN): >60 mL/min/{1.73_m2} (ref >60)
BKR GLUCOSE: 112 mg/dL — ABNORMAL HIGH (ref 70–100)
BKR POTASSIUM: 4.8 mmol/L (ref 3.3–5.3)
BKR SODIUM: 137 mmol/L (ref 136–144)

## 2020-04-10 LAB — CBC WITH AUTO DIFFERENTIAL
BKR WAM ABSOLUTE IMMATURE GRANULOCYTES.: 0.04 x 1000/??L (ref 0.00–0.30)
BKR WAM ABSOLUTE LYMPHOCYTE COUNT.: 1.54 x 1000/??L (ref 0.60–3.70)
BKR WAM ABSOLUTE NRBC (2 DEC): 0 x 1000/??L (ref 0.00–1.00)
BKR WAM ANALYZER ANC: 8.21 x 1000/??L — ABNORMAL HIGH (ref 2.00–7.60)
BKR WAM BASOPHIL ABSOLUTE COUNT.: 0.03 x 1000/??L (ref 0.00–1.00)
BKR WAM BASOPHILS: 0.3 % (ref 0.0–1.4)
BKR WAM EOSINOPHIL ABSOLUTE COUNT.: 0.02 x 1000/??L (ref 0.00–1.00)
BKR WAM EOSINOPHILS: 0.2 % (ref 0.0–5.0)
BKR WAM HEMATOCRIT (2 DEC): 36.8 % (ref 35.00–45.00)
BKR WAM HEMOGLOBIN: 11.7 g/dL (ref 11.7–15.5)
BKR WAM IMMATURE GRANULOCYTES: 0.4 % (ref 0.0–1.0)
BKR WAM LYMPHOCYTES: 13.7 % — ABNORMAL LOW (ref 17.0–50.0)
BKR WAM MCH (PG): 26.5 pg — ABNORMAL LOW (ref 27.0–33.0)
BKR WAM MCHC: 31.8 g/dL (ref 31.0–36.0)
BKR WAM MCV: 83.4 fL (ref 80.0–100.0)
BKR WAM MONOCYTE ABSOLUTE COUNT.: 1.43 x 1000/??L — ABNORMAL HIGH (ref 0.00–1.00)
BKR WAM MONOCYTES: 12.7 % — ABNORMAL HIGH (ref 4.0–12.0)
BKR WAM MPV: 11.9 fL (ref 8.0–12.0)
BKR WAM NEUTROPHILS: 72.7 % — ABNORMAL HIGH (ref 39.0–72.0)
BKR WAM NUCLEATED RED BLOOD CELLS: 0 % (ref 0.0–1.0)
BKR WAM PLATELETS: 330 x1000/ÂµL (ref 150–420)
BKR WAM RDW-CV: 16 % — ABNORMAL HIGH (ref 11.0–15.0)
BKR WAM RED BLOOD CELL COUNT.: 4.41 M/??L (ref 4.00–6.00)
BKR WAM WHITE BLOOD CELL COUNT: 11.3 x1000/??L — ABNORMAL HIGH (ref 4.0–11.0)

## 2020-04-10 LAB — MAGNESIUM: BKR MAGNESIUM: 1.9 mg/dL (ref 1.7–2.4)

## 2020-04-10 LAB — PHOSPHORUS     (BH GH L LMW YH): BKR PHOSPHORUS: 2.6 mg/dL (ref 2.2–4.5)

## 2020-04-10 MED ORDER — CAMPHOR-MENTHOL 0.5 %-0.5 % LOTION
0.5-0.5 % | Freq: Two times a day (BID) | TOPICAL | Status: DC
Start: 2020-04-10 — End: 2020-04-14
  Administered 2020-04-10 – 2020-04-14 (×8): via TOPICAL

## 2020-04-10 MED ORDER — INSULIN LISPRO 100 UNIT/ML (SLIDING SCALE)
100 unit/mL | Freq: Three times a day (TID) | SUBCUTANEOUS | Status: DC
Start: 2020-04-10 — End: 2020-04-14
  Administered 2020-04-12 – 2020-04-14 (×3): 100 mL via SUBCUTANEOUS

## 2020-04-10 MED ORDER — DIPHENHYDRAMINE 50 MG/ML INJECTION SOLUTION
50 mg/mL | Freq: Once | INTRAVENOUS | Status: CP | PRN
Start: 2020-04-10 — End: ?
  Administered 2020-04-10: 09:00:00 50 mL via INTRAVENOUS

## 2020-04-10 MED ORDER — INSULIN LISPRO 100 UNIT/ML (SLIDING SCALE)
100 unit/mL | Freq: Every evening | SUBCUTANEOUS | Status: DC
Start: 2020-04-10 — End: 2020-04-14
  Administered 2020-04-14: 02:00:00 100 mL via SUBCUTANEOUS

## 2020-04-10 MED ORDER — ACETAMINOPHEN 500 MG TABLET
500 mg | Freq: Four times a day (QID) | ORAL | Status: DC
Start: 2020-04-10 — End: 2020-04-14
  Administered 2020-04-10 – 2020-04-14 (×6): 500 mg via ORAL

## 2020-04-10 NOTE — Plan of Care
Plan of Care Overview/ Patient Status    1900-0700.Afebrile.Foley to gravity w/ adequate u/o.PCA Dilaudid for pain control.Pt remains  NPO denies any n/v so far no flatus so far.IVF as ordered.IS to 1000 level.Incision w/ gauze/tegaderm w/ small strikethrough,area marked.Pt c/o itching no HO Villagomez aware and saw pt benadryl  IV x 1 as ordered w/ some effect,will order hypoalllergenic sheet.Vboots on.Callbell w/in reach,see FS will monitor closely.

## 2020-04-10 NOTE — Anesthesia Post-Procedure Evaluation
Anesthesia Post-op NotePatient: Maria RiveraProcedure(s):  Procedure(s) (LRB):REPAIR COMPLEX VENTRAL HERNIA WITH MESH (N/A)IMPLANTATION, MESH/PROSTHESIS, INCISIONAL/VENTRAL HERNIA REPAIR (N/A) Patient location: PACULast Vitals:  I have noted the vital signs as listed in the nursing notes.Mental status recovered: patient participates in evaluation: YesVital signs reviewed: YesRespiratory function stable:YesAirway is patent: YesCardiovascular function and hydration status stable: YesPain control satisfactory: YesNausea and vomiting control satisfactory:YesNo complications documented.

## 2020-04-10 NOTE — Plan of Care
Inpatient Physical Therapy Evaluation IP Adult PT Eval/Treat - 04/10/20 1222    Date of Visit / Treatment  Date of Visit / Treatment 04/10/20   Note Type Evaluation   Progress Report Due 04/24/20   End Time 1222    General Information  Pertinent History Of Current Problem per chart-  Maria [name] is a 69 y.o. female patient POD#1  s/p complex ventral hernia repair.   Subjective   I just walked.   General Observations Patient sitting up in bed on RA. PCA pump in place.   Precautions/Limitations fall precautions    Prior Level of Functioning/Social History  Additional Comments Patient is independent at baseline. Lives with husband. Has stairs to manage.    Vital Signs and Orthostatic Vital Signs  Vital Signs Vital Signs Stable    Pain/Comfort  Pain Comment (Pre/Post Treatment Pain) 5/10 abdominal pain. On PCA pump.    Cognition  Overall Cognitive Status WFL   Orientation Level Oriented X4   Level of Consciousness alert    Vision/ Hearing  Vision Assessment Results No vision deficits noted    Range of Motion  Range of Motion Examination RUE ROM was WFL;LUE ROM was WFL;RLE ROM was WFL;LLE ROM was [facility]    Musculoskeletal  LUE Muscle Strength Grading 4-->active movement against gravity and resistance   RUE Muscle Strength Grading 4-->active movement against gravity and resistance   LLE Muscle Strength Grading 4-->active movement against gravity and resistance   RLE Muscle Strength Grading 4-->active movement against gravity and resistance    Sensory Assessment  Sensory Tests Results No sensory impairment noted    Skin Assessment  Skin Assessment See Nursing Documentation    Balance  Sitting Balance: Static  GOOD     Maintains static position against moderate resistance with no Assistive Device   Sitting Balance: Dynamic  GOOD    Independent in functional balance activities (ambulates on unlevel surfaces, turns, catches ball)   Standing Balance: Static GOOD     Maintains static position against moderate resistance with no Assistive Device   Standing Balance: Dynamic  GOOD    Independent in functional balance activities (ambulates on unlevel surfaces, turns, catches ball)   Balance Assist Device IV pole    Bed Mobility  Supine-to-Sit Independence/Assistance Level Minimum assist;Assist of 1   Supine-to-Sit Assist Device Hand held assist   Sit-to-Supine Independence/Assistance Level Minimum assist;Assist of 1   Sit-to-Supine Assist Device Hand held assist   Bed Mobility Comments Reviewed log rolling technique.    Training and development officer  Sit-to-Stand Transfer Independence/Assistance Level Contact guard;Assist of 1   Sit-to-Stand Transfer Assist Device No device   Stand-to-Sit Transfer Independence/Assistance Level Contact guard;Assist of 1   Stand-to-Sit Transfer Assist Device No device    Gait Training  Independence/Assistance Level  Contact guard;Assist of 1   Assistive Device  IV pole   Gait Distance 150 feet;x2   Gait Training Comments Steady. No LOB hen mobilizing    Stair Performance  Number of Stairs 4   Stair Railings present on right side   Independence/Assistance Level  Contact guard;Assist of 1   Hydrographic surveyor Comment Steady when mobilizing. Limited due to PCA pump.    Handoff Documentation  Handoff Discussed with nursing;Patient in bed    Endurance  Endurance Comments Fair    PT- AM-PAC - Basic Mobility Screen- How much help from another person do you currently need.....  Turning from your back to your side while in a  a flat bed without using rails? 4 - None - Does not require any help and does the activity independently. Can use assistive devices.   Moving from lying on your back to sitting on the side of a flat bed without using bed rails? 3 - A Little - Requires a little help (supervision, minimal assistance). Can use assistive devices.   Moving to and from a bed to a chair (including a wheelchair)? 4 - None - Does not require any help and does the activity independently. Can use assistive devices.   Standing up from a chair using your arms(e.g., wheelchair or bedside chair)? 4 - None - Does not require any help and does the activity independently. Can use assistive devices.   To walk in a hospital room? 3 - A Little - Requires a little help (supervision, minimal assistance). Can use assistive devices.   Climbing 3-5 steps with a railing? 3 - A Little - Requires a little help (supervision, minimal assistance). Can use assistive devices.   AMPAC Mobility Score 21   TARGET Highest Level of Mobility Mobility Level 6, Walk 10+steps    Therapeutic Exercise  Therapeutic Exercise Comments Encouraged ther ex and continued mobilization with nursing staff    Clinical Impression  Initial Assessment PT consulted for mobility assessment. Patient presents with fair strength, balance, and ability to mobilize. Currently able to ambulate with CGA and IV pole. She demonstrates adequate strength and balance in order to manage 4 steps. Will benefit from continued PT to progress function as tolerated. Recommending home with family assist once medically able.   Impairments Found (describe specific impairments) aerobic capacity/endurance;mobility;balance;muscle strength;muscle performance    Frequency/Equipment Recommendations  PT Frequency 3x per week   What day of week is next treatment expected? Thursday    PT Recommendations for Inpatient Admission  Activity/Level of Assist ambulate;assist of 1   Therapeutic Exercise encourage exercise program issued;ROM as tolerated    Planned Treatment / Interventions  Plan for Next Visit progress as tol    PT Discharge Summary  Physical Therapy Disposition Recommendation Home   Additional Physical Therapy Disposition Recommendations Home with family support   Equipment Recommendations for Discharge No durable medical equipment needed     Harrell Gave DPT MHB [phone] Problem: Physical Therapy GoalsGoal: Physical Therapy GoalsOutcome: Initial problem identificationNote: PT GOALS1. Patient will perform bed mobility independently2. Patient will perform transfers with least assistive device with modified independence3. Patient will ambulate a minimum of 150 feet with least assistive device with modified independence4. Patient will ascend and descend at least 15 steps with modified independence

## 2020-04-10 NOTE — Progress Notes
24h Events:NAEO. C/o mild full body itching not related to medicines and not relieved with benadryl. No associated SOB/CP, numbness/tingling. S:She is having mild  abdominal pain.She is having no nausea or vomiting.She is not passing gas and is not having bowel movements.O:Temp:  [97.5 ?F (36.4 ?C)-98.2 ?F (36.8 ?C)] 98.2 ?F (36.8 ?C)Pulse:  [63-89] 88Resp:  [10-20] 18BP: (111-145)/(57-76) 116/68SpO2:  [93 %-100 %] 98 %Abdomen: On inspection is distended, is not tympanitic, is appropriately tender. Incision clean, dry, well approximated without erythema or purulent drainage.I/O last 3 completed shifts:In: 3285.4 [I.V.:3185.4; IV Piggyback:100]Out: 1555 [Urine:1455; Blood:100]A:1 Day Post-Op after REPAIR COMPLEX VENTRAL HERNIA WITH MESH (N/A )IMPLANTATION, MESH/PROSTHESIS, INCISIONAL/VENTRAL HERNIA REPAIR (N/A ) Maria [name] is a 63F s/p complex ventral hernia repair and LOA, POD1. C/o mild full body itching, w/out associated sx or provocation and unrelieved with benadryl; possible rxn to bed linens. Will trial hypoallergenic linens and bring clothing from home. P:Diet: advance to CLDFluids: mIVFPain: dPCAGU: dc foley todayDVT/PE PPX: Lovenox, SCD'SWork with PT/OOB/Ambulate/ISDispo: pending clinical course Claiborne Rigg, PAColorectal Floor Pager: [phone]

## 2020-04-10 NOTE — Plan of Care
Plan of Care Overview/ Patient Status    Problem: Adult Inpatient Plan of CareGoal: Readiness for Transition of CareOutcome: Interventions implemented as appropriate Case Management Evaluation    Most Recent Value Case Management Evaluation and Plan Arrived from prior to admission home/apartment/condo Admitted from: home Do you have a caregiver? No  Lives With Mother, Spouse Services Prior to Admission none Patient Requires Care Coordination Intervention Due To discharge planning needs/concerns Prior to Hospitalization: Assistance Needed/DME being used None Documented Insurance Accurate Yes Any financial concerns related to anticipated discharge needs No Patient's home address verified Yes Patient's PCP of record verified Yes Last Date Seen by PCP 0-3 months Patient has new PCP (specify name). PLEASE UPDATE DEMOGRAPHICS Cena Benton, MD Living Environment  Lives With Mother, Spouse Source of Clinical History Patient's clinical history has been reviewed and source of Information is: Patient Case Manager Attestation: Choose which ONE is appropriate for you I have reviewed the medical record and completed the above evaluation with the following recommendations. Yes Discharge Planning Coordination Recommendations Discharge Planning Coordination Recommendations Needs not determined at this time Case Manager reviewed plan of care/ continuum of care need's with  Patient  1 Day Post-Op after REPAIR COMPLEX VENTRAL HERNIA WITH MESH (N/A )IMPLANTATION, MESH/PROSTHESIS, INCISIONAL/VENTRAL HERNIA REPAIR (N/A )Pt status post day one from a ventral hernia repair. Pt lives with spouse and mother in law in two story home with bed bath on second floor. Pt at baseline is independent with ADL's but has husband if needs assistance to go upstairs and requires a walker to navigate safely at home. Spouse will transport home at discharge. Assessment screening completed. Continue to follow patient's progress and discuss plan of care with treatment team.  Discharge needs not determined at this time. Care Management will continue to follow with team. Lovell Sheehan, RN,MSN [facility] Manager, Care ManagementMHB:[phone]

## 2020-04-11 LAB — CBC WITH AUTO DIFFERENTIAL
BKR WAM ABSOLUTE IMMATURE GRANULOCYTES.: 0.09 x 1000/??L (ref 0.00–0.30)
BKR WAM ABSOLUTE LYMPHOCYTE COUNT.: 1.44 x 1000/??L (ref 0.60–3.70)
BKR WAM ABSOLUTE NRBC (2 DEC): 0 x 1000/??L (ref 0.00–1.00)
BKR WAM ANALYZER ANC: 7.78 x 1000/??L — ABNORMAL HIGH (ref 2.00–7.60)
BKR WAM BASOPHIL ABSOLUTE COUNT.: 0.05 x 1000/??L (ref 0.00–1.00)
BKR WAM BASOPHILS: 0.5 % (ref 0.0–1.4)
BKR WAM EOSINOPHIL ABSOLUTE COUNT.: 0.11 x 1000/??L (ref 0.00–1.00)
BKR WAM EOSINOPHILS: 1 % (ref 0.0–5.0)
BKR WAM HEMATOCRIT (2 DEC): 35.9 % (ref 35.00–45.00)
BKR WAM HEMOGLOBIN: 11.3 g/dL — ABNORMAL LOW (ref 11.7–15.5)
BKR WAM IMMATURE GRANULOCYTES: 0.8 % (ref 0.0–1.0)
BKR WAM LYMPHOCYTES: 13.5 % — ABNORMAL LOW (ref 17.0–50.0)
BKR WAM MCH (PG): 27 pg (ref 27.0–33.0)
BKR WAM MCHC: 31.5 g/dL (ref 31.0–36.0)
BKR WAM MCV: 85.9 fL (ref 80.0–100.0)
BKR WAM MONOCYTE ABSOLUTE COUNT.: 1.17 x 1000/??L — ABNORMAL HIGH (ref 0.00–1.00)
BKR WAM MONOCYTES: 11 % (ref 4.0–12.0)
BKR WAM MPV: 11.9 fL (ref 8.0–12.0)
BKR WAM NEUTROPHILS: 73.2 % — ABNORMAL HIGH (ref 39.0–72.0)
BKR WAM NUCLEATED RED BLOOD CELLS: 0 % (ref 0.0–1.0)
BKR WAM PLATELETS: 287 x1000/??L (ref 150–420)
BKR WAM RDW-CV: 16.1 % — ABNORMAL HIGH (ref 11.0–15.0)
BKR WAM RED BLOOD CELL COUNT.: 4.18 M/??L (ref 4.00–6.00)
BKR WAM WHITE BLOOD CELL COUNT: 10.6 x1000/??L (ref 4.0–11.0)

## 2020-04-11 LAB — MAGNESIUM: BKR MAGNESIUM: 1.8 mg/dL (ref 1.7–2.4)

## 2020-04-11 LAB — BASIC METABOLIC PANEL
BKR ANION GAP: 13 (ref 7–17)
BKR BLOOD UREA NITROGEN: 4 mg/dL — ABNORMAL LOW (ref 8–23)
BKR BUN / CREAT RATIO: 8.7 (ref 8.0–23.0)
BKR CALCIUM: 8.6 mg/dL — ABNORMAL LOW (ref 8.8–10.2)
BKR CHLORIDE: 97 mmol/L — ABNORMAL LOW (ref 98–107)
BKR CO2: 27 mmol/L (ref 20–30)
BKR CREATININE: 0.46 mg/dL (ref 0.40–1.30)
BKR EGFR (AFR AMER): >60 mL/min/{1.73_m2} (ref >60)
BKR EGFR (NON AFRICAN AMERICAN): >60 mL/min/{1.73_m2} (ref >60)
BKR GLUCOSE: 106 mg/dL — ABNORMAL HIGH (ref 70–100)
BKR POTASSIUM: 3.9 mmol/L (ref 3.3–5.3)
BKR SODIUM: 137 mmol/L (ref 136–144)

## 2020-04-11 LAB — PHOSPHORUS     (BH GH L LMW YH): BKR PHOSPHORUS: 2 mg/dL — ABNORMAL LOW (ref 2.2–4.5)

## 2020-04-11 MED ORDER — VENLAFAXINE ER 150 MG CAPSULE,EXTENDED RELEASE 24 HR
150 mg | Freq: Every day | ORAL | Status: DC
Start: 2020-04-11 — End: 2020-04-14
  Administered 2020-04-12 – 2020-04-14 (×3): 150 mg via ORAL

## 2020-04-11 MED ORDER — PRAVASTATIN 20 MG TABLET
20 mg | Freq: Every evening | ORAL | Status: DC
Start: 2020-04-11 — End: 2020-04-14
  Administered 2020-04-13 – 2020-04-14 (×2): 20 mg via ORAL

## 2020-04-11 MED ORDER — ONDANSETRON HCL (PF) 4 MG/2 ML INJECTION SOLUTION
42 mg/2 mL | Freq: Four times a day (QID) | INTRAVENOUS | Status: DC | PRN
Start: 2020-04-11 — End: 2020-04-14
  Administered 2020-04-12: 18:00:00 4 mL via INTRAVENOUS

## 2020-04-11 MED ORDER — ONDANSETRON HCL (PF) 4 MG/2 ML INJECTION SOLUTION
4 mg/2 mL | Freq: Once | INTRAVENOUS | Status: CP
Start: 2020-04-11 — End: ?
  Administered 2020-04-11: 16:00:00 4 mL via INTRAVENOUS

## 2020-04-11 NOTE — Other
[place] [facility]OSPITALOPERATIVE REPORTCONFIDENTIAL - DO NOT COPY WITHOUT APPROPRIATE AUTHORIZATIONName:  Maria [name], Maria [name]  Service:  [facility] PERI EUnit No:  [id number] CSN:  [id number] Service Area:  SurDate of Birth:  January 28, 1952Date of Adm:  12/14/2021Dictated:  04/09/2020 12:39:33          Dictated by:  Saunders Glance. Venetia Constable.D.Transcribed:  04/09/2020 13:21:43       [id number]/[id number] DATE OF PROCEDURE/SURGERY:  12/14/21OPERATION:Ext Lysis of adhesions repair of ventral hernia with meshOPERATOR:Tremell Reimers K. [name], M.D.CO-SURGEON:Reuben Knoblock K. [name], M.D. and Tacy Dura, M.D., who will be dictating her portion.ASSISTANT:Erin Cliffton Asters, M.D.ANESTHESIOLOGIST:ANESTHESIA:PREOP DIAGNOSIS:Recurrent ventral hernias, recent bowel obstruction.POSTOP DIAGNOSIS:sameESTIMATED BLOOD LOSS:N/ASPECIMENS REMOVED:N/AFINDINGS:  The patient had a fairly large right upper quadrant hernia with multiple adhesions of small bowel in the right upper quadrant.  The hernia low in the suprapubic area was not investigated.INDICATIONS FOR SURGERY:  Recurrent hernia and recent bowel obstruction.  Findings:  There was a 6 x 6 cm fascial defect in the right upper quadrant with numerous adhesions of bowel to the anterior abdominal wall and loops of bowel adhesions as well.PROCEDURE:  Patient was taken to the operating room, placed on the operating table in supine position.  After she was anesthetized with general endotracheal anesthesia, she was given prophylactic antibiotics and then prepped and draped in the usual sterile fashion.  Foley catheter placed.  OG tube placed.  The patient then had a midline incision made, taken down through the subcutaneous tissue, through the midline fascia and safely into the peritoneal cavity.  At this point, over 3 hours of lysis of adhesions was performed with both Dr's. Deniece Portela and [name] as co-surgeons on the case and Dr. Kathrin Ruddy assisting as well.  We took all of loops of the small bowel down away from the anterior abdominal wall throughout the entire upper abdomen, and once this was down, there was a 6 x 6 cm defect in the right upper quadrant.  A decision was made not to go down into the low abdomen because of the numerous adhesions and that the problems had been in the right upper quadrant in the past. At this point, the fascial defect hernia in the right upper quadrant was brought together with a running #1 PDS suture.  Once this was done, a 10 x 15 Parietex mesh was then sewn circumferentially with transfascial sutures of interrupted 0 Prolene and once this was circumferentially sewed around the whole entire sort of underside of the anterior abdominal wall around the midline incision and around the hernia that had been pulled together, the area was irrigated and then all of the transfascial stitches were tied down circumferentially.  Once this was done, the area was irrigated.  The fascia in the midline was closed using looped #1 PDS.  The area was irrigated and then the skin was pulled together using deep 3-0 Vicryl simple interrupted stitches and then a 4-0 Monocryl subcuticular stitch.  Steri-Strips were applied.  A dry sterile dressing was placed. The estimated blood loss was less than 50 cc.  The instrument, sponge, and needle count was reported to be correct.  The patient was extubated.  Abdominal binder was placed.  Foley catheter was left in place.  OG tube was removed.  She was discharged to the Postanesthesia Care Unit.

## 2020-04-11 NOTE — Plan of Care
Plan of Care Overview/ Patient Status    7am-3:30pm pt VSS, afebrile, abd pain managed well with current regimen. Pt tolerating sips of cl liq diet, mild nausea relieved with PCA dilaudid. Pt voiding spont, adeq amount of cl yellow urine, no bm. Abd incision dressing intact with dry drainage, abd binder on. Pt ambulated in the hallway, tolerated well. Please see flow sheets for details.

## 2020-04-11 NOTE — Plan of Care
Plan of Care Overview/ Patient Status    1900-0700.Afebrile.PCA Dilaudid and po tylenol for pain control.Pt oob to br w/ standby assist.Voids spon.Clears offered and tolerated well denies any n/v no flatus yet.IS to 1000 level.Abinder on.Vboots on.Callbell w/in reach,see FS will monitor

## 2020-04-11 NOTE — Plan of Care
Problem: Adult Inpatient Plan of CareGoal: Absence of Hospital-Acquired Illness or InjuryOutcome: Interventions implemented as appropriate Problem: Adult Inpatient Plan of CareGoal: Optimal Comfort and WellbeingOutcome: Interventions implemented as appropriate Problem: Adult Inpatient Plan of CareGoal: Patient-Specific Goal (Individualized)Outcome: Interventions implemented as appropriate Plan of Care Overview/ Patient Status    Pt is A&O x 4, VSS on room air. Reports intermittent periods of abdominal pain, managing with tylenol and Dilaudid PCA with good effect. Abdomen is tender with audible BS x 4 quadrants. Abdominal incision covered by dry guaze. Wearing abdominal binder. Pt is tolerating CLD well. Voiding spontaneously in toilet. Ambulated twice in hallway. IVF's running continuously. Call light within reach.

## 2020-04-11 NOTE — Progress Notes
PHARMACY-ASSISTED MEDICATION REPORTPharmacist review of the best possible medication history obtained by the pharmacy medication history technician has been performed.  I have updated the home medication list and identified the following information that may be relevant to this admission.NOTES/RECOMMENDATIONS Recommend restarting gabapentin, olopatadine, pravastatin and venlafaxine as clinically appropriate.        Prior to Admission Medications Medication Name Sig Taking? Patient Reported   acetaminophen (TYLENOL) 500 mg tabletLast dose: 04/08/2020 at Unknown time Take 1 tablet (500 mg total) by mouth every 4 (four) hours as needed. Yes Yes   ascorbic acid (VITAMIN C ORAL)Last dose: Past Month at Unknown timeLast Medication Note: >> Doloris Hall   ZOX Jul 16, 2018  6:52 PM Entered by Ellard Artis, Amarylis, CPHT Sat Jul 16, 2018 0960 Take 1 tablet by mouth daily. Yes Yes   ascorbic acid-collagen (COLLAGEN PLUS VITAMIN C) 125-740 mg CapLast dose: Past Month at Unknown timeLast Medication Note: >> Doloris Hall   AVW Jul 16, 2018  6:52 PM Entered by Ellard Artis, Amarylis, CPHT Sat Jul 16, 2018 0981 Take 1 capsule by mouth daily.  Yes Yes   biotin 1 mg tabletLast dose: Past Month at Unknown timeLast Medication Note: >> Doloris Hall   XBJ Jul 16, 2018  6:52 PM Entered by Ellard Artis, Amarylis, CPHT Sat Jul 16, 2018 4782 Take 1 tablet by mouth daily.  Yes Yes   cholecalciferol, vitamin D3, (VITAMIN D3) 5,000 unit TabLast dose: Past Week at Unknown timeLast Medication Note: >> Durwin Glaze   NFA Nov 21, 2016 10:17 AM Entered by Durwin Glaze, PharmD Sat Nov 21, 2016 1017 Take 1 tablet by mouth every other day.. Yes Yes   gabapentin (NEURONTIN) 100 mg capsuleLast dose: Past Week at Unknown time Take 100 mg by mouth at bedtime. Yes Yes   JARDIANCE 25 mg tabletLast dose: 04/08/2020 at Unknown time 1 TABLET BY MOUTH ONCE DAILY IN THE MORNING FOR 30 DAYS Yes Yes   meclizine (ANTIVERT) 12.5 mg tabletLast dose: Past Month at Unknown time Take 1 tablet (12.5 mg total) by mouth daily as needed for Dizziness Yes     melatonin 10 mg TabLast dose: Past Month at Unknown timeLast Medication Note: >> Valetta Fuller   Fri Jul 08, 2018  1:18 PM Entered by Valetta Fuller, CPHT Fri Jul 08, 2018 1318 Take 10 mg by mouth nightly as needed.  Yes Yes   multivitamin tabletLast dose: Past Month at Unknown time Take 1 tablet by mouth daily. Yes Yes   olopatadine (PATADAY) 0.2 % DropLast dose: Past Month at Unknown timeLast Medication Note: >> Ezzard Flax Apr 09, 2020  6:19 PM Entered by Francis Dowse, CPHT Tue Apr 09, 2020 1819 Place 1 drop into both eyes daily Yes     ondansetron (ZOFRAN) 4 MG tabletLast dose: Past Week at Unknown time Take 4 mg by mouth every 8 (eight) hours as needed for Nausea. Yes Yes   phytonadione, vit K1, (VITAMIN K) 100 mcg tabletLast dose: Past Week at Unknown time Take 100 mcg by mouth daily. Yes Yes   polyethylene glycol (MIRALAX) 17 gram packetLast dose: Past Week at Unknown timeLast Medication Note: >> Ezzard Flax Apr 09, 2020  6:19 PM Entered by Francis Dowse, CPHT Tue Apr 09, 2020 1819 Take 1 packet (17 g total) by mouth daily. Mix in 8 ounces of water, juice, soda, coffee or tea prior to taking. Yes     prasterone, dhea,-calcium carb (DHEA)  10 mg-47 mg calcium TabLast dose: Past Month at Unknown time Take by mouth. Yes Yes   pravastatin (PRAVACHOL) 40 MG tabletLast dose: 04/08/2020 at Unknown timeLast Medication Note: >> Valetta Fuller   Fri Jul 08, 2018  1:19 PM Entered by Valetta Fuller, CPHT Fri Jul 08, 2018 1319 Take 40 mg by mouth nightly. Yes Yes   senna (SENOKOT) 8.6 mg tabletLast dose: 04/08/2020 at Unknown timeLast Medication Note: >> Doloris Hall   ZOX Jul 16, 2018  6:53 PM Entered by Ellard Artis, Amarylis, CPHT Sat Jul 16, 2018 0960 Take 2 tablets by mouth nightly.. Yes     sitaGLIPtin (JANUVIA) 100 MG tabletLast dose: 04/08/2020 at Unknown timeLast Medication Note: >> Ezzard Flax Apr 09, 2020  6:19 PM Entered by Francis Dowse, CPHT Tue Apr 09, 2020 1819 Take 100 mg by mouth daily.. Yes Yes   sodium chloride (SODIUM CHLORIDE) 0.65 % nasal sprayLast dose: Past Month at Unknown timeLast Medication Note: >> Michaelyn Barter   Fri Sep 13, 2015  9:40 AM Entered by Michaelyn Barter, CPHT Fri Sep 13, 2015 0940 1 spray by Each Nare route as needed for Congestion..  Yes Yes   venlafaxine (EFFEXOR XR) 150 mg XR 24 hr extended release capsuleLast dose: 04/08/2020 at Unknown timeLast Medication Note: >> Ezzard Flax Apr 09, 2020  6:19 PM Entered by Francis Dowse, CPHT Tue Apr 09, 2020 1819 Take 150 mg by mouth daily.  Yes Yes   ZINC ORALLast dose:  -- Last Medication Note: >> Ezzard Flax Apr 09, 2020  6:20 PMMedHx Tech(Caitlin Cherre Blanc, CPHT): FLAG FOR REMOVAL -  Patient started and stopped reported OTC/Vitamin on their ownEntered by Francis Dowse, CPHT Tue Apr 09, 2020 1820 Take by mouth daily.    Yes   Prior to admission medications last reviewed by Francis Dowse, CPHT on Tue Apr 09, 2020 1821 Thank Tery Sanfilippo, PharmD12/16/20218:59 AMPhone: MHB

## 2020-04-11 NOTE — Progress Notes
24h Events:NAEO; pain well controlled S:She is having mild  abdominal pain.She is having nausea. She is tolerating a diet.She is not passing gas and is not having bowel movements.O:Temp:  [98 ?F (36.7 ?C)-98.4 ?F (36.9 ?C)] 98.4 ?F (36.9 ?C)Pulse:  [73-90] 83Resp:  [18-20] 20BP: (126-154)/(62-75) 154/62SpO2:  [93 %-98 %] 94 %Abdomen: On inspection is not distended, is not tympanitic, is appropriately tender. Incision clean, dry, well approximated without erythema or purulent drainage abdominal binder in place.I/O last 3 completed shifts:In: 4564 [P.O.:1440; I.V.:3124]Out: 1875 [Urine:1875]A:2 Days Post-Op after REPAIR COMPLEX VENTRAL HERNIA WITH MESH (N/A )IMPLANTATION, MESH/PROSTHESIS, INCISIONAL/VENTRAL HERNIA REPAIR (N/A ) [place] Dowd is a 62F s/p complex ventral hernia repair and LOA, POD2; progressing well post-operatively. Still without flatus or BM. P:Diet: CLD with glucernaFluids: mIVFPain: dPCADVT: lovenoxDispo: awaiting return of bowel functionContact:Ngoc Detjen, PAColorectal Floor Pager: [phone]

## 2020-04-11 NOTE — Plan of Care
Problem: Adult Inpatient Plan of CareGoal: Plan of Care ReviewOutcome: Interventions implemented as appropriate Plan of Care Overview/ Patient Status    1530-1900 Patient is AxOx4, RA, VSS. Continent b/b- No BM, No flatus. Clear liquid diet. Patient states pain is tolerable. Dilaudid PCA. LR running at 126mL/hr. Abdomen incision-dressing dry drainage- abdominal binder on. Independent OOB. Safety maintained, call bell within reach, bed low, and hourly rounded.

## 2020-04-11 NOTE — Plan of Care
Plan of Care Overview/ Patient Status    Problem: Adult Inpatient Plan of CareGoal: Readiness for Transition of CareOutcome: Interventions implemented as appropriate 2 Days Post-Op after REPAIR COMPLEX VENTRAL HERNIA WITH MESH (N/A )IMPLANTATION, MESH/PROSTHESIS, INCISIONAL/VENTRAL HERNIA REPAIR . Pt remains on clear liquids. PT recommendation for home ,pt requesting VNA. Referrals sent on pt behalf. Pt accepted V[facility] post discharge. Cm to follow with team.Marcquis Ridlon Jon Billings, RN,MSN [facility] Manager, Care ManagementMHB:[phone]

## 2020-04-12 MED ORDER — OXYCODONE IMMEDIATE RELEASE 5 MG TABLET
5 mg | ORAL | Status: DC | PRN
Start: 2020-04-12 — End: 2020-04-14
  Administered 2020-04-14 (×2): 5 mg via ORAL

## 2020-04-12 MED ORDER — OXYCODONE IMMEDIATE RELEASE 5 MG TABLET
5 mg | ORAL | Status: DC | PRN
Start: 2020-04-12 — End: 2020-04-14

## 2020-04-12 NOTE — Plan of Care
Problem: Adult Inpatient Plan of CareGoal: Patient-Specific Goal (Individualized)Outcome: Interventions implemented as appropriate Problem: Adult Inpatient Plan of CareGoal: Absence of Hospital-Acquired Illness or InjuryOutcome: Interventions implemented as appropriate Problem: Adult Inpatient Plan of CareGoal: Optimal Comfort and WellbeingOutcome: Interventions implemented as appropriate Plan of Care Overview/ Patient Status    Pt is A&O x 4, VSS on room air. Managing abdominal pain with tylenol and Dilaudid PCA with good effect. Abdomen is tender with + BS x 4 quadrants. Tolerating consistent carb diet well. Passing flatus and stool. Abdominal incision covered with dry gauze. Voiding spontaneously in toilet. Ambulating SBA x 1 in hallway, tolerating well. IS use encouraged. SCD's on B/L. Call light within reach.

## 2020-04-12 NOTE — Plan of Care
Problem: Adult Inpatient Plan of CareGoal: Plan of Care ReviewOutcome: Interventions implemented as appropriateGoal: Patient-Specific Goal (Individualized)Outcome: Interventions implemented as appropriateGoal: Absence of Hospital-Acquired Illness or InjuryOutcome: Interventions implemented as appropriateGoal: Optimal Comfort and WellbeingOutcome: Interventions implemented as appropriateGoal: Readiness for Transition of CareOutcome: Interventions implemented as appropriate Plan of Care Overview/ Patient Status

## 2020-04-12 NOTE — Plan of Care
Inpatient Physical Therapy Progress Note IP Adult PT Eval/Treat - 04/12/20 1104    Date of Visit / Treatment  Date of Visit / Treatment 04/12/20   Progress Report Due 04/24/20   End Time 1104    General Information  Subjective  okay let's walk   General Observations pt in bed, PIV, abd binder, NAD, RN cleared for PT   Precautions/Limitations fall precautions    Vital Signs and Orthostatic Vital Signs  Vital Signs Vital Signs Stable    Pain/Comfort  Pain Comment (Pre/Post Treatment Pain) pt c/o abdominal cramping, RN made aware, positioned to comfort    Patient Coping  Observed Emotional State cooperative;accepting   Diversional Activities television    Cognition  Overall Cognitive Status WFL    Vision/ Hearing  Vision Assessment Results No vision deficits noted    Range of Motion  Range of Motion Examination no ROM deficits were identified    Manual Muscle Testing  Manual Muscle Testing Comments deconditioned    Skin Assessment  Skin Assessment See Nursing Documentation    Balance  Sitting Balance: Static  GOOD     Maintains static position against moderate resistance with no Assistive Device   Sitting Balance: Dynamic  GOOD    Independent in functional balance activities (ambulates on unlevel surfaces, turns, catches ball)   Standing Balance: Static GOOD     Maintains static position against moderate resistance with no Assistive Device   Standing Balance: Dynamic  GOOD    Independent in functional balance activities (ambulates on unlevel surfaces, turns, catches ball)   Balance Assist Device IV pole    Bed Mobility  Supine-to-Sit Independence/Assistance Level Supervision   Supine-to-Sit Assist Device Bed rails   Sit-to-Supine Independence/Assistance Level Supervision   Sit-to-Supine Assist Device Bed rails   Bed Mobility Comments effortful due to discomfort    Sit-Stand Transfer Training  Sit-to-Stand Transfer Independence/Assistance Level Contact guard   Sit-to-Stand Transfer Assist Device No device   Stand-to-Sit Transfer Independence/Assistance Level Contact guard   Stand-to-Sit Transfer Assist Device No device   Sit-Stand Transfer Comments steady    Gait Training  Independence/Assistance Level  Supervision   Assistive Device  IV pole   Gait Distance 150 feet;x2   Gait Training Comments steady, noted fatigue    Handoff Documentation  Handoff Patient in bed;Patient instructed to call nursing for mobility;Discussed with nursing    Endurance  Endurance Comments fair    PT- AM-PAC - Basic Mobility Screen- How much help from another person do you currently need.....  Turning from your back to your side while in a a flat bed without using rails? 4 - None - Does not require any help and does the activity independently. Can use assistive devices.   Moving from lying on your back to sitting on the side of a flat bed without using bed rails? 4 - None - Does not require any help and does the activity independently. Can use assistive devices.   Moving to and from a bed to a chair (including a wheelchair)? 3 - A Little - Requires a little help (supervision, minimal assistance). Can use assistive devices.   Standing up from a chair using your arms(e.g., wheelchair or bedside chair)? 3 - A Little - Requires a little help (supervision, minimal assistance). Can use assistive devices.   To walk in a hospital room? 3 - A Little - Requires a little help (supervision, minimal assistance). Can use assistive devices.   Climbing 3-5 steps with a railing? 3 -  A Little - Requires a little help (supervision, minimal assistance). Can use assistive devices.   AMPAC Mobility Score 20   TARGET Highest Level of Mobility Mobility Level 6, Walk 10+steps    Therapeutic Exercise  Therapeutic Exercise Comments seated B LE therex reviewed and perform    Clinical Impression  Follow up Assessment pt seen for PT treatment with fair tolerance. pt demonstrates ability to ambulate fairly well, noting fatigue. pt educated to walk with nursing as well.    Patient/Family Stated Goals  Patient/Family Stated Goal(s) return home;return to prior level of functioning    Frequency/Equipment Recommendations  PT Frequency 3x per week   What day of week is next treatment expected? Monday    PT Recommendations for Inpatient Admission  Activity/Level of Assist ambulate;assist of 1    Planned Treatment / Interventions  Plan for Next Visit as able   Training Treatment / Interventions Balance / Gait Training   Education Treatment / Interventions Patient Education / Training    PT Discharge Summary  Physical Therapy Disposition Recommendation Home   Additional Physical Therapy Disposition Recommendations Home with family support   Equipment Recommendations for Discharge No durable medical equipment needed     Linton Rump, [phone] Plan of Care Overview/ Patient Status

## 2020-04-12 NOTE — Progress Notes
[facility]   General Surgery Progress NoteAttending Provider: Virl Diamond., MDAdmit Date: 12/14/2021Hospital Day: 43 Days Post-Op s/p Procedure(s) (LRB):REPAIR COMPLEX VENTRAL HERNIA WITH MESH (N/A)IMPLANTATION, MESH/PROSTHESIS, INCISIONAL/VENTRAL HERNIA REPAIR (N/A)Code status: Full Code/ACLSAllergy: Morphine; Biaxin  [clarithromycin]; and Latex, natural rubberSubjective: Feeling wellComplains of some soreness in her backNo flatus or BMsObjective: Temp:  [98.1 ?F (36.7 ?C)-98.4 ?F (36.9 ?C)] 98.3 ?F (36.8 ?C)Pulse:  [77-88] 78Resp:  [18-20] 20BP: (123-190)/(62-78) 146/75SpO2:  [94 %-99 %] 95 %Device (Oxygen Therapy): room airI/O last 3 completed shifts:In: 362.6 [P.O.:360; I.V.:2.6]Out: 400 [Urine:400]CBCLab Results Component Value Date  WBC 10.6 04/11/2020  HGB 11.3 (L) 04/11/2020  HCT 35.90 04/11/2020  PLT 287 04/11/2020 LYTESLab Results Component Value Date  NA 137 04/11/2020  K 3.9 04/11/2020  CL 97 (L) 04/11/2020  CO2 27 04/11/2020  BUN 4 (L) 04/11/2020  CREATININE 0.46 04/11/2020  GLU 111 (H) 04/11/2020  CALCIUM 8.6 (L) 04/11/2020  MG 1.8 04/11/2020  PHOS 2.0 (L) 04/11/2020 LFTsLab Results Component Value Date  BILITOT 0.2 03/30/2020  BILIDIR <0.2 01/03/2020  AST 26 03/30/2020  ALT 20 03/30/2020  ALKPHOS 141 (H) 03/30/2020  AMYLASE 41 08/22/2014  LIPASE 16 01/03/2020 PT/INR/PTTLab Results Component Value Date  INR 1.89 (H) 03/31/2020  PTT 38.3 (H) 03/31/2020 Diagnostics:No results found.PHYSICAL EXAMGEN: Awake, alert, oriented x 3 with normal speech and mentationNEURO: is nonfocalCV: Regular rate PULM: Unlabored on room airGI: soft, nontender, nondistended, incisions clean, dry, intact with steris and abdominal binderAssessment Patient is a 69 y.o. female, Hospital Day: 4, 3 Days Post-Op, s/p Procedure(s) (LRB):REPAIR COMPLEX VENTRAL HERNIA WITH MESH (N/A)IMPLANTATION, MESH/PROSTHESIS, INCISIONAL/VENTRAL HERNIA REPAIR (N/A). Doing well post-operatively although remains with ileusPlan ?	AXR today?	Continue clears + glucerna shakes?	Therapeutic lovenox for lupus anticoagulant?	Should ambulate TID?	AROBF Electronically Signed BM:WUXL M White, MD12/17/2021, 8:33 AMAttending Addendum:  Patient without complaint  No gas or BMafeb vssabd softly distended  Non tenderWbc nlProlonged ileus not unexpectedOOBSips and glucernaISabd binderXray

## 2020-04-12 NOTE — Plan of Care
Maria [name]					Location: EP 73 SURG/[id number] y.o., female				Attending: Virl Diamond., MD	Admit Date: 04/09/2020			[id number] LOS: 3 days INITIAL NUTRITION ASSESSMENTDIET ORDER: Clear Liquid ANTHROPOMETRICS:Height: 59Admit wt: 68.9 kgDosing Weight: 48.7 kg ([place] + 10%) BMI: 30.68 -- based on admit weight Additional wt information:Height and weight confirmed by pt. Pt unsure of weight loss although states that she suspects she has lost weight over the past few weeks due to poor po intake. No muscle/fat depletion observed. Will continue to monitor weights as available. ESTIMATED NUTRITION REQUIREMENTS:Kcal/day: 1461	(30 kcal/kg)Protein/day: 73	(1.5 gm/kg)Fluids/day: Fluid management per medical team discretion. (1461 = 67mL/kcal)Needs based on: dosing wt, surgery  	NUTRITION ASSESSMENT:Chart reviewed for NPO/Clear Liquid diet. Patient with hx of lupus admitted for and s/p ventral hernia repair now with ileus awaiting return of bowel function. Met with pt this afternoon. Pt with visitor at bedside. Pt reports poor po intake since surgery for bowel obstruction on 12/4. Pt denies food allergies/intolerances. No questions for RD at this time. Will continue to follow. Cultural/Religious/Ethnic Needs: NoNUTRITION DIAGNOSIS:Inadequate oral intake related to current medical status as evidenced by NPO/Clear Liquid diet.INTERVENTIONS/RECOMMENDATIONS: Meals and Snacks:- further diet advancement as medically able. Goal:  Patient will have diet advanced or initiate nutrition support prior to next nutrition assessmentNutrition Supplement Therapy:- will add Ensure Clear TIDGoal:  Patient to consume an average of 2-3 oral nutrition supplements/day prior to next nutrition assessment Discharge Planning and Transfer of Nutrition Care:  Nutrition related discharge needs still being determined at this time, will continue to follow  MONITORING / EVALUATION:Food/Nutrition-Related History:Food and Nutrient IntakeEnteral and Parenteral Nutrition IntakeAnthropometric Measurements:Weight Biochemical Data, Medical Tests and Procedures:Electrolyte and renal profileGlucose/endocrine profileNutrition-Focus Physical FindingsOverall findingsElectronically signed by Jeanella Craze, RD, CNSC December 17, 2021MHB:[phone] note: Nutrition is a consult only service on weekends and holidays.  Please enter a consult in EPIC if assistance is needed on a weekend or holiday or via MHB [phone] to contact the covering RD.

## 2020-04-13 LAB — CBC WITH AUTO DIFFERENTIAL
BKR WAM ABSOLUTE IMMATURE GRANULOCYTES.: 0.08 x 1000/??L (ref 0.00–0.30)
BKR WAM ABSOLUTE LYMPHOCYTE COUNT.: 1.05 x 1000/??L (ref 0.60–3.70)
BKR WAM ABSOLUTE NRBC (2 DEC): 0 x 1000/??L (ref 0.00–1.00)
BKR WAM ANALYZER ANC: 8.93 x 1000/??L — ABNORMAL HIGH (ref 2.00–7.60)
BKR WAM BASOPHIL ABSOLUTE COUNT.: 0.05 x 1000/??L (ref 0.00–1.00)
BKR WAM BASOPHILS: 0.5 % (ref 0.0–1.4)
BKR WAM EOSINOPHIL ABSOLUTE COUNT.: 0.25 x 1000/??L (ref 0.00–1.00)
BKR WAM EOSINOPHILS: 2.3 % (ref 0.0–5.0)
BKR WAM HEMATOCRIT (2 DEC): 37.4 % (ref 35.00–45.00)
BKR WAM HEMOGLOBIN: 11.9 g/dL (ref 11.7–15.5)
BKR WAM IMMATURE GRANULOCYTES: 0.7 % (ref 0.0–1.0)
BKR WAM LYMPHOCYTES: 9.5 % — ABNORMAL LOW (ref 17.0–50.0)
BKR WAM MCH (PG): 26.8 pg — ABNORMAL LOW (ref 27.0–33.0)
BKR WAM MCHC: 31.8 g/dL (ref 31.0–36.0)
BKR WAM MCV: 84.2 fL (ref 80.0–100.0)
BKR WAM MONOCYTE ABSOLUTE COUNT.: 0.71 x 1000/??L (ref 0.00–1.00)
BKR WAM MONOCYTES: 6.4 % (ref 4.0–12.0)
BKR WAM MPV: 11.8 fL (ref 8.0–12.0)
BKR WAM NEUTROPHILS: 80.6 % — ABNORMAL HIGH (ref 39.0–72.0)
BKR WAM NUCLEATED RED BLOOD CELLS: 0 % (ref 0.0–1.0)
BKR WAM PLATELETS: 321 x1000/??L (ref 150–420)
BKR WAM RDW-CV: 15.9 % — ABNORMAL HIGH (ref 11.0–15.0)
BKR WAM RED BLOOD CELL COUNT.: 4.44 M/??L (ref 4.00–6.00)
BKR WAM WHITE BLOOD CELL COUNT: 11.1 x1000/??L — ABNORMAL HIGH (ref 4.0–11.0)

## 2020-04-13 LAB — BASIC METABOLIC PANEL
BKR ANION GAP: 15 (ref 7–17)
BKR BLOOD UREA NITROGEN: 4 mg/dL — ABNORMAL LOW (ref 8–23)
BKR BUN / CREAT RATIO: 7.3 — ABNORMAL LOW (ref 8.0–23.0)
BKR CALCIUM: 8.9 mg/dL (ref 8.8–10.2)
BKR CHLORIDE: 96 mmol/L — ABNORMAL LOW (ref 98–107)
BKR CO2: 24 mmol/L (ref 20–30)
BKR CREATININE: 0.55 mg/dL (ref 0.40–1.30)
BKR EGFR (AFR AMER): >60 mL/min/{1.73_m2} (ref >60)
BKR EGFR (NON AFRICAN AMERICAN): >60 mL/min/{1.73_m2} (ref >60)
BKR GLUCOSE: 106 mg/dL — ABNORMAL HIGH (ref 70–100)
BKR POTASSIUM: 4.2 mmol/L (ref 3.3–5.3)
BKR SODIUM: 135 mmol/L — ABNORMAL LOW (ref 136–144)

## 2020-04-13 LAB — MAGNESIUM: BKR MAGNESIUM: 1.6 mg/dL — ABNORMAL LOW (ref 1.7–2.4)

## 2020-04-13 LAB — PHOSPHORUS     (BH GH L LMW YH): BKR PHOSPHORUS: 3.4 mg/dL (ref 2.2–4.5)

## 2020-04-13 NOTE — Plan of Care
Plan of Care Overview/ Patient Status    1900-0700.Afebrile.Pt denies any need for pain med.Pt reports passing flatus ,denies any n /v.Voids spon.Ambulated in hallway independently.Abdominal binder on.Callbell w/in reach,see FS will monitor

## 2020-04-13 NOTE — Plan of Care
Plan of Care Overview/ Patient Status    Pt doing well, denies pain, tolerating diet, voiding to toilet, ambulates independently. Will continue to monitor.

## 2020-04-14 DIAGNOSIS — K567 Ileus, unspecified: Secondary | ICD-10-CM

## 2020-04-14 DIAGNOSIS — E119 Type 2 diabetes mellitus without complications: Secondary | ICD-10-CM

## 2020-04-14 DIAGNOSIS — E7800 Pure hypercholesterolemia, unspecified: Secondary | ICD-10-CM

## 2020-04-14 DIAGNOSIS — D6862 Lupus anticoagulant syndrome: Secondary | ICD-10-CM

## 2020-04-14 DIAGNOSIS — K430 Incisional hernia with obstruction, without gangrene: Secondary | ICD-10-CM

## 2020-04-14 DIAGNOSIS — Z6830 Body mass index (BMI) 30.0-30.9, adult: Secondary | ICD-10-CM

## 2020-04-14 DIAGNOSIS — E669 Obesity, unspecified: Secondary | ICD-10-CM

## 2020-04-14 DIAGNOSIS — K432 Incisional hernia without obstruction or gangrene: Secondary | ICD-10-CM

## 2020-04-14 LAB — CBC WITH AUTO DIFFERENTIAL
BKR WAM ABSOLUTE IMMATURE GRANULOCYTES.: 0.06 x 1000/??L (ref 0.00–0.30)
BKR WAM ABSOLUTE LYMPHOCYTE COUNT.: 1.35 x 1000/??L (ref 0.60–3.70)
BKR WAM ABSOLUTE NRBC (2 DEC): 0 x 1000/??L (ref 0.00–1.00)
BKR WAM ANALYZER ANC: 7.03 x 1000/??L (ref 2.00–7.60)
BKR WAM BASOPHIL ABSOLUTE COUNT.: 0.04 x 1000/??L (ref 0.00–1.00)
BKR WAM BASOPHILS: 0.4 % (ref 0.0–1.4)
BKR WAM EOSINOPHIL ABSOLUTE COUNT.: 0.24 x 1000/??L (ref 0.00–1.00)
BKR WAM EOSINOPHILS: 2.5 % (ref 0.0–5.0)
BKR WAM HEMATOCRIT (2 DEC): 33.9 % — ABNORMAL LOW (ref 35.00–45.00)
BKR WAM HEMOGLOBIN: 10.9 g/dL — ABNORMAL LOW (ref 11.7–15.5)
BKR WAM IMMATURE GRANULOCYTES: 0.6 % (ref 0.0–1.0)
BKR WAM LYMPHOCYTES: 14.3 % — ABNORMAL LOW (ref 17.0–50.0)
BKR WAM MCH (PG): 26.7 pg — ABNORMAL LOW (ref 27.0–33.0)
BKR WAM MCHC: 32.2 g/dL (ref 31.0–36.0)
BKR WAM MCV: 82.9 fL (ref 80.0–100.0)
BKR WAM MONOCYTE ABSOLUTE COUNT.: 0.72 x 1000/??L (ref 0.00–1.00)
BKR WAM MONOCYTES: 7.6 % (ref 4.0–12.0)
BKR WAM MPV: 11.1 fL (ref 8.0–12.0)
BKR WAM NEUTROPHILS: 74.6 % — ABNORMAL HIGH (ref 39.0–72.0)
BKR WAM NUCLEATED RED BLOOD CELLS: 0 % (ref 0.0–1.0)
BKR WAM PLATELETS: 337 x1000/??L (ref 150–420)
BKR WAM RDW-CV: 15.7 % — ABNORMAL HIGH (ref 11.0–15.0)
BKR WAM RED BLOOD CELL COUNT.: 4.09 M/??L (ref 4.00–6.00)
BKR WAM WHITE BLOOD CELL COUNT: 9.4 x1000/??L (ref 4.0–11.0)

## 2020-04-14 LAB — BASIC METABOLIC PANEL
BKR ANION GAP: 9 (ref 7–17)
BKR BLOOD UREA NITROGEN: 7 mg/dL — ABNORMAL LOW (ref 8–23)
BKR BUN / CREAT RATIO: 13.7 (ref 8.0–23.0)
BKR CALCIUM: 8.7 mg/dL — ABNORMAL LOW (ref 8.8–10.2)
BKR CHLORIDE: 101 mmol/L (ref 98–107)
BKR CO2: 29 mmol/L (ref 20–30)
BKR CREATININE: 0.51 mg/dL (ref 0.40–1.30)
BKR EGFR (AFR AMER): >60 mL/min/{1.73_m2} (ref >60)
BKR EGFR (NON AFRICAN AMERICAN): >60 mL/min/{1.73_m2} (ref >60)
BKR GLUCOSE: 156 mg/dL — ABNORMAL HIGH (ref 70–100)
BKR POTASSIUM: 3.8 mmol/L (ref 3.3–5.3)
BKR SODIUM: 139 mmol/L (ref 136–144)

## 2020-04-14 LAB — MAGNESIUM: BKR MAGNESIUM: 1.7 mg/dL (ref 1.7–2.4)

## 2020-04-14 LAB — PHOSPHORUS     (BH GH L LMW YH): BKR PHOSPHORUS: 2.8 mg/dL (ref 2.2–4.5)

## 2020-04-14 MED ORDER — OXYCODONE IMMEDIATE RELEASE 5 MG TABLET
5 mg | ORAL_TABLET | ORAL | 1 refills | Status: AC | PRN
Start: 2020-04-14 — End: 2020-05-31

## 2020-04-14 NOTE — Progress Notes
[facility]   General Surgery Progress NoteAttending Provider: Virl Diamond., MDAdmit Date: 12/14/2021Hospital Day: 54 Days Post-Op s/p Procedure(s) (LRB):REPAIR COMPLEX VENTRAL HERNIA WITH MESH (N/A)IMPLANTATION, MESH/PROSTHESIS, INCISIONAL/VENTRAL HERNIA REPAIR (N/A)Code status: Full Code/ACLSAllergy: Morphine; Biaxin  [clarithromycin]; and Latex, natural rubberSubjective: Passing flatus and BMx2Tolerated cream of wheat yesterday without issuePain well controlled Objective: Temp:  [97.6 ?F (36.4 ?C)-98.4 ?F (36.9 ?C)] 98.2 ?F (36.8 ?C)Pulse:  [73-96] 73Resp:  [18-20] 20BP: (98-148)/(52-77) 137/71SpO2:  [95 %-98 %] 98 %Device (Oxygen Therapy): room airI/O last 3 completed shifts:In: 1022 [P.O.:1020; I.V.:2]Out: 950 [Urine:950]CBCLab Results Component Value Date  WBC 10.6 04/11/2020  HGB 11.3 (L) 04/11/2020  HCT 35.90 04/11/2020  PLT 287 04/11/2020 LYTESLab Results Component Value Date  NA 137 04/11/2020  K 3.9 04/11/2020  CL 97 (L) 04/11/2020  CO2 27 04/11/2020  BUN 4 (L) 04/11/2020  CREATININE 0.46 04/11/2020  GLU 161 (H) 04/12/2020  CALCIUM 8.6 (L) 04/11/2020  MG 1.8 04/11/2020  PHOS 2.0 (L) 04/11/2020 LFTsLab Results Component Value Date  BILITOT 0.2 03/30/2020  BILIDIR <0.2 01/03/2020  AST 26 03/30/2020  ALT 20 03/30/2020  ALKPHOS 141 (H) 03/30/2020  AMYLASE 41 08/22/2014  LIPASE 16 01/03/2020 PT/INR/PTTLab Results Component Value Date  INR 1.89 (H) 03/31/2020  PTT 38.3 (H) 03/31/2020 Diagnostics:No results found.PHYSICAL EXAMGEN: Awake, alert, oriented x 3 with normal speech and mentationNEURO: is nonfocalCV: Regular rate PULM: Unlabored on room airGI: soft, nontender, nondistended, incision dressing clean, dry, intact and abdominal binderAssessment Patient is a 69 y.o. female, Hospital Day: 5, 4 Days Post-Op, s/p Procedure(s) (LRB):REPAIR COMPLEX VENTRAL HERNIA WITH MESH (N/A)IMPLANTATION, MESH/PROSTHESIS, INCISIONAL/VENTRAL HERNIA REPAIR (N/A). Doing well post-operatively now with return of bowel function. She has tolerated a small amount of PO intake. Plan ?	Diabetic diet?	Therapeutic lovenox for lupus anticoagulant?	Should ambulate TID Electronically Signed MW:UXLK T Zendayah Hardgrave, MD12/18/2021Attending Addendum:

## 2020-04-14 NOTE — Progress Notes
[facility] - YSCGeneral Surgery Progress NoteInterval Events/Subjective:NAEO. HDS. -Pain well-controlled.-Tolerating diet, passing flatus. BM two days ago.-Voiding spontaneously.-Ambulating.Objective:Vitals:  04/14/20 0426 BP: 112/68 Pulse: 68 Resp: 18 Temp: 97.7 ?F (36.5 ?C) Intake/Output Summary (Last 24 hours) at 04/14/2020 0811Last data filed at 04/13/2020 2043Gross per 24 hour Intake 540 ml Output -- Net 540 ml LDA: pIVPhysical exam:General: NADNeuro: Moving extremities spontaneouslyHEENT: NCATHeart: RRRLungs: Breathing comfortably on RAAbdomen: Soft, appropriate peri-incisional TTP, steri strips in place, dryExtremities: WWPWound(s): As above.ZOX:WRUEAV Labs   12/18/210614 12/19/210431 WBC 11.1* 9.4 HGB 11.9 10.9* HCT 37.40 33.90* PLT 321 337 WUJ:WJXBJY Labs   12/18/210614 12/19/210431 NA 135* 139 K 4.2 3.8 CL 96* 101 CO2 24 29 BUN 4* 7* CREATININE 0.55 0.51 CALCIUM 8.9 8.7* MG 1.6* 1.7 PHOS 3.4 2.8 Coags:No results for input(s): LABPT, INR, PTT in the last 72 hours.Invalid input(s): PTLFTs:No results for input(s): BILITOT, BILIDIR, AST, ALT, ALKPHOS in the last 72 hours.Imaging:No results found. Assessment and Plan:54F s/p open ventral hernia repair with mesh 12/14, recovering well. Tolerating diet with return of bowel function, ambulating, voiding, pain well-controlled.-Discharge home todayFinal plans pending attending addendum/note.Katrina Stack, MDGeneral Surgery

## 2020-04-14 NOTE — Discharge Instructions
Please schedule a follow up appointment as directed. Take acetaminophen over the counter for pain relief (975 mg every six hours as needed). We also have prescribed oxycodone as needed for pain. You can shower; simply pat the area dry. No tub soaking, bathing, or swimming for four weeks after surgery. You have steri strips (surgical strips) over your incisions; please do not remove these. They will fall off on their own after a few days to few weeks. No heavy lifting (>20 lbs) for at least four weeks after surgery. Please call the office if you are experiencing a fever, uncontrollable pain, or uncontrollable bleeding or discharge from your wound.

## 2020-04-14 NOTE — Plan of Care
Problem: Adult Inpatient Plan of CareGoal: Readiness for Transition of CareOutcome: Outcome(s) achieved Plan of Care Overview/ Patient Status    Anticipate discharge home with [facility] (SN/PT) when medically cleared.  Nursing to fax W10 upon discharge.Case Management Plan    Most Recent Value Discharge Planning Mode of Transportation  Private car  (add comment for special considerations) Patient accompanied by Spouse CM D/C Readiness PASRR completed and approved N/A Authorization number obtained, if required N/A Is there a 3 day INPATIENT Qualifying stay for Medicare Patients? N/A DME Authorized/Delivered N/A No needs identified/ follow up with PCP/MD N/A Post acute care services secured W10 complete Yes Finalized Plan Expected Discharge Date 04/14/20 Discharge Disposition Home-Health Care Svc  [[facility]]  Hilbert Odor, MSN, RN, Statistician - Weekend/FloatYale  HospitalMHB: [phone]

## 2020-04-14 NOTE — Plan of Care
Plan of Care Overview/ Patient Status    PT A/Ox4. RA. VSS. Some Abd pain, Oxy given w/ good effect. Abd incision covered w/ dressing w/ old drainage, Abd binder on. OOB independently, voids spontaneously. Passing gas but no BM this shift.

## 2020-04-14 NOTE — Plan of Care
Plan of Care Overview/ Patient Status    Pt doing well, tolerating diet, 5mg  PO oxycodone for pain, effective, plan is for discharge to home.

## 2020-04-14 NOTE — Discharge Summary
[facility] Discharge SummaryPatient Data:  Patient Name: Maria [name] Admit date: 04/09/2020 Age: 69 y.o. Discharge date: 04/14/20 DOB: 1951-03-19	 Discharge Attending Physician: Virl Diamond., MD  MRN: [id number]	 Discharged Condition: good PCP: Cena Benton, MD Disposition: Home Home with Homecare Services Principal Diagnosis: recurrent ventral herniaSecondary Diagnoses:  N/AIssues to be Addressed Post Discharge: Issues to be Addressed Post Discharge:1.	Follow up with Dr. Valerie Salts.	Wound Care3.	Post operative pain managementRelevant Medications on Discharge:New Medications: detailed below.Pending Labs and Tests: Follow-up Information:No follow-up provider specified. Future Appointments Date Time Provider Department Center 04/24/2020  4:45 PM [facility] MA NP 1268 MAM2 [facility] MAMMO [facility] Rad 04/29/2020 11:30 AM Lajuana Matte, MD [facility] Course: Hospital Course: Maria [name] is a 69 y.o. female with a hx of recurrent ventral hernia was admitted to the hospital on 04/09/2020 after undergoing a complex ventral hernia repair with Dr. [name] and Dr. Deniece Portela. The procedure was tolerated well. NGT and foley catheter were placed intraoperatively. Postoperatively, the patient was extubated and brought to the PACU in stable condition. Later, they were transferred to the surgical floor and evaluated by a member of the surgical team. At that time, [name] pain was well controlled and they were hemodynamically stable.  Lovenox and SCD were initiated for DVT prophylaxis. The patient was kept NPO.On POD1, started on a CLD, then advanced to carb consistent diet on POD3 with return of bowel function. Ambulating, voiding, pain well-controlled. She will be discharged home with VNA services at her request (PT cleared for home with family support)Inpatient Consultants and summary of recommendations:N/APertinent Procedures or Surgeries: Surgical and Procedural Summary this Admission   Past and Present Procedures (04/09/2020 to Today)   Date Procedures Providers Location  04/09/2020 REPAIR COMPLEX VENTRAL HERNIA WITH MESH;IMPLANTATION, MESH/PROSTHESIS, INCISIONAL/VENTRAL HERNIA REPAIR Virl Diamond. (Primary)Zuckerman, Purnell Shoemaker [facility]    Pertinent lab findings and test results: Objective: Recent Labs Lab 12/09/210445 12/09/210445 12/10/210341 12/10/210341 12/10/210645 WBC 11.9*  --  12.3*  --  11.9* HGB 12.8   < > 12.7   < > 13.7 HCT 40.20   < > 38.00   < > 41.40 PLT 302   < > 320   < > 331  < > = values in this interval not displayed.  Recent Labs Lab 12/09/210445 12/10/210341 12/10/210645 NEUTROPHILS 82.7* 78.4* 78.6*  Recent Labs Lab 12/09/210445 12/09/210748 12/10/210341 12/10/210341 12/10/210645 12/10/210752 12/14/211126 NA 137  --  137  --  138  --   --  K 3.5   < > 3.0*   < > 3.1*  --   --  CL 103   < > 104   < > 104  --   --  CO2 13*   < > 15*   < > 16*  --   --  BUN 8   < > 8   < > 9  --   --  CREATININE 0.50   < > 0.53   < > 0.53  --   --  GLU 82   < > 120*   < > 119*   < > 165* ANIONGAP 21*   < > 18*   < > 18*  --   --   < > = values in this interval not displayed.  Recent Labs Lab 12/09/210445 12/09/210445 12/10/210341 12/10/210341 12/10/210645 CALCIUM 8.6*  --  7.7*  --  9.0 MG 1.8   < > 1.7   < > 2.1 PHOS 1.8*   < >  2.2   < > 2.4  < > = values in this interval not displayed.  No results for input(s): ALT, AST, ALKPHOS, BILITOT, BILIDIR in the last 168 hours. No results for input(s): PTT, LABPROT, INR in the last 168 hours. Culture Information:No results for input(s): LABBLOO, LABURIN, LOWERRESPIRA in the last 168 hours.Imaging: Imaging results last 24h: No results found.Diet:  Diabetic dietMobility: Physical Exam Discharge vitals: Temp:  [97.5 ?F (36.4 ?C)-98 ?F (36.7 ?C)] 97.5 ?F (36.4 ?C)Pulse:  [63-84] 75Resp:  [10-18] 14BP: (115-144)/(57-71) 117/57SpO2:  [93 %-100 %] 100 %Device (Oxygen Therapy): room airO2 Flow (L/min):  [2] 2 Pertinent Findings of Physical Exam: UnremarkableCognitive Status at Discharge: BaselineDischarge Physical Exam:Physical ExamNADRRRCTABAbdomen soft, nondistended, expected peri-incisional TTP, steri strips in place with dry blood, wound intactAllergies Allergies Allergen Reactions ? Morphine Hives and Itching   Pt tolerated morphine during admission 08/08/16-08/09/16.  Pt premedicated with diphenhydramine ? Biaxin  [Clarithromycin]  ? Latex, Natural Rubber Rash  PMH PSH Past Medical History: Diagnosis Date ? Acute ischemic colitis (HC Code) (HC CODE) 2015 ? Dental crown present   UPPER FRONT CAP ? Depression   Receving treatment at [facility] ? Diabetes mellitus (HC Code) (HC CODE)  ? Fibroid   2003 ? GERD (gastroesophageal reflux disease)   SINCE WT LOSS AND DIET CHANGE PT NO LONGER HAS PROBLEMS WITH GERD ? Hypercholesteremia  ? 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 PONV (postoperative nausea and vomiting)  ? Rosacea  ? Ventral hernia with bowel obstruction  ? Vertigo   Past Surgical History: Procedure Laterality Date ? ABDOMINAL ADHESION SURGERY  2016 ? BLEPHAROPLASTY Bilateral 05/2016 ? COLOSTOMY  05/27/13 ? COLOSTOMY CLOSURE   ? KNEE LIGAMENT RECONSTRUCTION Right 2004 ? LAPAROSCOPIC NISSEN FUNDOPLICATION    2000 ? ? SINUS SURGERY   ? THYROID BIOPSY    NEGATIVE PER PATIENT ? TUBAL LIGATION   ? UMBILICAL HERNIA REPAIR   ? VENTRAL HERNIA REPAIR    Social History Family History Social History Tobacco Use ? Smoking status: Never Smoker ? Smokeless tobacco: Never Used Substance Use Topics ? Alcohol use: Yes   Comment: once or twice a year  Family History Problem Relation Age of Onset ? Cancer Mother       bladder cancer ? Bladder cancer Mother  ? Colon cancer Paternal Aunt  ? Colon cancer Cousin       died of colona cancer at age 74 ? Breast cancer Cousin       2 cousins with breast cancer ? Colon cancer Father  ? Cancer Sister   Discharge Medications: Discharge: Current Discharge Medication List  START taking these medications  Details oxyCODONE (ROXICODONE) 5 mg Immediate Release tablet Take 1 tablet (5 mg total) by mouth every 4 (four) hours as needed.Qty: 8 tablet, Refills: 0Start date: 04/14/2020   CONTINUE these medications which have NOT CHANGED  Details acetaminophen (TYLENOL) 500 mg tablet Take 1 tablet (500 mg total) by mouth every 4 (four) hours as needed.Qty: 30 tablet, Refills: 11  ascorbic acid (VITAMIN C ORAL) Take 1 tablet by mouth daily.  ascorbic acid-collagen (COLLAGEN PLUS VITAMIN C) 125-740 mg Cap Take 1 capsule by mouth daily.   biotin 1 mg tablet Take 1 tablet by mouth daily.   cholecalciferol, vitamin D3, (VITAMIN D3) 5,000 unit Tab Take 1 tablet by mouth every other day..  gabapentin (NEURONTIN) 100 mg capsule Take 100 mg by mouth at bedtime.  JARDIANCE 25 mg tablet 1 TABLET BY MOUTH ONCE DAILY IN THE  MORNING FOR 30 DAYS  meclizine (ANTIVERT) 12.5 mg tablet Take 1 tablet (12.5 mg total) by mouth daily as needed for DizzinessQty: 30 tablet, Refills: 1  Associated Diagnoses: Vertigo  melatonin 10 mg Tab Take 10 mg by mouth nightly as needed.   multivitamin tablet Take 1 tablet by mouth daily.  olopatadine (PATADAY) 0.2 % Drop Place 1 drop into both eyes dailyQty: 2.5 mL, Refills: 6  Associated Diagnoses: Allergic conjunctivitis of both eyes  ondansetron (ZOFRAN) 4 MG tablet Take 4 mg by mouth every 8 (eight) hours as needed for Nausea.  phytonadione, vit K1, (VITAMIN K) 100 mcg tablet Take 100 mcg by mouth daily.  polyethylene glycol (MIRALAX) 17 gram packet Take 1 packet (17 g total) by mouth daily. Mix in 8 ounces of water, juice, soda, coffee or tea prior to taking.Qty: 14 each, Refills: 2  prasterone, dhea,-calcium carb (DHEA) 10 mg-47 mg calcium Tab Take by mouth.  pravastatin (PRAVACHOL) 40 MG tablet Take 40 mg by mouth nightly.  senna (SENOKOT) 8.6 mg tablet Take 2 tablets by mouth nightly.Rob Bunting: 30 tablet, Refills: 11  Associated Diagnoses: SBO (small bowel obstruction) (HC Code) (HC CODE)  sitaGLIPtin (JANUVIA) 100 MG tablet Take 100 mg by mouth daily..  sodium chloride (SODIUM CHLORIDE) 0.65 % nasal spray 1 spray by Each Nare route as needed for Congestion.[name]   venlafaxine (EFFEXOR XR) 150 mg XR 24 hr extended release capsule Take 150 mg by mouth daily.    STOP taking these medications   enoxaparin (LOVENOX) 100 mg/mL syringe    ZINC ORAL    There was at total of approximately 20 minutes spent on the discharge of this patientElectronically Signed:Michael Vida Roller, APRN 04/09/2020 3:02 PMPeter Romie Jumper, MDGeneral SurgeryBest Contact Information: MHB

## 2020-04-14 NOTE — Care Coordination-Inpatient
I identified my role as TC from Care Management department .  Medicare Important Message explained to patient over the phone.  Patient stated understanding the notice and was in agreement with the discharge plan.  Patient advised of the right to call the [employer]) at [phone] if not in agreement with the discharge from the hospital.  Copy sent to [facility].Lauralyn Primes CoordinatorCare Management DepartmentYale Conde HospitalMHB: [phone]

## 2020-04-14 NOTE — Plan of Care
Problem: Adult Inpatient Plan of CareGoal: Plan of Care ReviewOutcome: Interventions implemented as appropriateGoal: Patient-Specific Goal (Individualized)Outcome: Interventions implemented as appropriateGoal: Absence of Hospital-Acquired Illness or InjuryOutcome: Interventions implemented as appropriateGoal: Optimal Comfort and WellbeingOutcome: Interventions implemented as appropriateGoal: Readiness for Transition of CareOutcome: Interventions implemented as appropriate Problem: Impaired Wound HealingGoal: Optimal Wound HealingOutcome: Interventions implemented as appropriate Problem: Fall Injury RiskGoal: Absence of Fall and Fall-Related InjuryOutcome: Interventions implemented as appropriate Problem: Physical Therapy GoalsGoal: Physical Therapy GoalsOutcome: Interventions implemented as appropriate Plan of Care Overview/ Patient Status    1500-1900A+O x4. VSS on RA. Abdominal dressing intact with scant serosanguineous drainage. Abdominal binder in place. Tolerating low carb diet well. Voids spontaneously. OOB independent. Ambulated in hallway multiple times this shift. See flowsheets for more details.

## 2020-04-16 NOTE — Other
[facility] HOSPITALOPERATIVE REPORTCONFIDENTIAL - DO NOT COPY WITHOUT APPROPRIATE AUTHORIZATIONName:  Maria [name], Maria [name]  Service:  [facility] PERI EUnit No:  [id number] CSN:  [id number] Service Area:  SurDate of Birth:  04-08-1952Date of Adm:  12/14/2021Dictated:  04/09/2020 15:46:48          Dictated by:  Tacy Dura, M.D.Transcribed:  04/09/2020 16:44:05       [id number] DATE OF PROCEDURE/SURGERY:  12/14/2021OPERATION:Lysis of adhesions and repair of recurrent ventral hernia.Mora Appl, M.D.CO-SURGEON:Michael [name], M.D.ASSISTANT:ANESTHESIOLOGIST:ANESTHESIA:PREOP DIAGNOSIS:Recurrent small-bowel obstruction, recurrent ventral hernia.POSTOP DIAGNOSIS:Recurrent small-bowel obstruction, recurrent ventral hernia.ESTIMATED BLOOD LOSS:N/ASPECIMENS REMOVED:N/APROCEDURE:  I was a co-surgeon to Dr. [name] because there was not an appropriate level resident to assist with this challenging case.

## 2020-04-18 ENCOUNTER — Ambulatory Visit
Admit: 2020-04-18 | Payer: PRIVATE HEALTH INSURANCE | Attending: Pharmacist Clinician (PhC)/ Clinical Pharmacy Specialist | Primary: Internal Medicine

## 2020-04-18 DIAGNOSIS — Z5181 Encounter for therapeutic drug level monitoring: Secondary | ICD-10-CM

## 2020-04-18 DIAGNOSIS — D6862 Lupus anticoagulant syndrome: Secondary | ICD-10-CM

## 2020-04-18 DIAGNOSIS — K559 Vascular disorder of intestine, unspecified: Secondary | ICD-10-CM

## 2020-04-18 NOTE — Progress Notes
Pharmacist Anticoagulation Clinic Telephone Visit Follow Maria [name] ([birth year]) is on chronic anticoagulation for the diagnosis of  Lupus anticoagulant with hypercoagulable state (HC Code) (HC CODE)  (primary encounter diagnosis) Ischemic colitis (HC Code) (HC CODE) with an INR goal of 2.0-3.0.  The patient was contacted regarding post-discharge warfarin dosing plan.  Patient's INR findings and prescribed dose from last visit:Anticoagulation Monitoring Latest Ref Rng & Units 03/26/2020 03/29/2020 04/18/2020 INR 2.0 - 3.0 - - - INR 0.91 - 1.10 - - - INR (External) 0.80 - 1.15 - - - Assoc. INR Date - 03/26/2020 - - Associated INR - 2.18 - - Instructions - 12/9: Hold; 12/10: Hold; 12/11: Hold; 12/12: Hold; 12/13: Hold; Otherwise 5 mg every day 12/9: Hold; 12/10: Hold; 12/11: Hold; 12/12: Hold; 12/13: Hold; Otherwise 5 mg every day 5 mg every day Last 7 day dose total - 35 mg 35 mg 35 mg Next INR Date - 04/03/2020 04/03/2020 04/22/2020 The following findings were reported from today's call (no findings if none selected):[x]  Missed doses []  Extra doses [x]  Change in medications []  Change in vitamin k rich food intake (green vegetables, herbals, etc) []  Change in alcohol use []  Change in overall health []  Recent hospitalization / emergency department visit []  Upcoming procedure (including dental procedures) []  Other Comments:       See below The following side effects were reported from today's call (no side effects if none selected):[]  Bleeding []  New bruising []  Warfarin-emergency department visit or hospital admission []  Other Comments:   No side effects Assessment/Plan:Patient was discharged on 12/19 after her surgery. She reports she was not discharged on Lovenox; was instructed to start warfarin without a Lovenox bridge. She's been taking warfarin 5 mg daily and Tylenol ~ 2000 mg daily (will increase INR). There are no INRs for the patient from her hospitalization over the last week, and she was not started on warfarin while inpatient. Patient reports she lost her phone right before getting discharged and no one else in her household has a phone and she was not able to call our office to let us know she was home and not instructed to bridge back to therapeutic INR. Patient reports she's been in a lot of pain after her surgery and has been in bed for 23 hours of 24 hours every day this week. She was instructed to continue on warfarin 5 mg daily and retest INR on Monday. She will be able to test when she goes to her doctor's appointment. Plan sent to provider for review. [facility] home draw order was sent just in case for 04/24/20 if dose needs to be adjusted next week (short week due to holiday). Please cancel this order if not needed next week. Anticoagulation Summary  As of 04/18/2020  INR goal:  2.0-3.0 TTR:  46.2 % (5.9 y) INR used for dosing:  No new INR was available at the time of this encounter. Warfarin maintenance plan:  5 mg (5 mg x 1) every day Weekly warfarin total:  35 mg Plan last modified:  [name], PharmD (03/11/2020) Next INR check:  04/22/2020 Priority:  High Priority Target end date:  Indefinite  Indications  Lupus anticoagulant with hypercoagulable state (HC Code) (HC CODE) [D68.62]Ischemic colitis (HC Code) (HC CODE) [K55.9]   Anticoagulation Episode Summary   INR check location:    Preferred lab:    Send INR reminders to:  [facility] INR POOL  Comments:    Anticoagulation Care Providers   Provider Role  Specialty Phone number  [name], MD Referring Medical Oncology [phone]  Florian Buff [name], APRN Responsible Medical Oncology [phone]  All patient's medications have been reviewed and updated as needed.  Electronically Signed by [name], PharmD, December 23, 2021[facility]  [address], [place], [place] [address]  Phone:[phone]  Fax: [phone]

## 2020-04-22 ENCOUNTER — Ambulatory Visit
Admit: 2020-04-22 | Payer: PRIVATE HEALTH INSURANCE | Attending: Pharmacist Clinician (PhC)/ Clinical Pharmacy Specialist | Primary: Internal Medicine

## 2020-04-22 ENCOUNTER — Ambulatory Visit: Admit: 2020-04-22 | Payer: PRIVATE HEALTH INSURANCE | Attending: Medical Oncology | Primary: Internal Medicine

## 2020-04-22 ENCOUNTER — Inpatient Hospital Stay: Admit: 2020-04-22 | Discharge: 2020-04-22 | Payer: PRIVATE HEALTH INSURANCE | Primary: Internal Medicine

## 2020-04-22 DIAGNOSIS — D6862 Lupus anticoagulant syndrome: Secondary | ICD-10-CM

## 2020-04-22 DIAGNOSIS — Z5181 Encounter for therapeutic drug level monitoring: Secondary | ICD-10-CM

## 2020-04-22 DIAGNOSIS — K559 Vascular disorder of intestine, unspecified: Secondary | ICD-10-CM

## 2020-04-22 DIAGNOSIS — Z7901 Long term (current) use of anticoagulants: Secondary | ICD-10-CM

## 2020-04-22 LAB — PROTIME AND INR
BKR INR: 2.23 — ABNORMAL HIGH (ref 0.91–1.05)
BKR PROTHROMBIN TIME: 22.9 seconds — ABNORMAL HIGH (ref 10.0–11.6)

## 2020-04-22 NOTE — Progress Notes
Pharmacist Anticoagulation Clinic Telephone Visit Follow Maria [name] (1951/01/10) is on chronic anticoagulation for the diagnosis of  Lupus anticoagulant with hypercoagulable state (HC Code) (HC CODE)  (primary encounter diagnosis) Ischemic colitis (HC Code) (HC CODE) with an INR goal of 2.0-3.0.  The patient was contacted regarding lab drawn INR results.  Patient's INR findings and prescribed dose from last visit:Anticoagulation Monitoring Latest Ref Rng & Units 03/29/2020 04/18/2020 04/22/2020 INR 2.0 - 3.0 - - - INR 0.91 - 1.10 - - - INR (External) 0.80 - 1.15 - - - Assoc. INR Date - - - 04/22/2020 Associated INR - - - 2.23 Instructions - 12/9: Hold; 12/10: Hold; 12/11: Hold; 12/12: Hold; 12/13: Hold; Otherwise 5 mg every day 5 mg every day 5 mg every day Last 7 day dose total - 35 mg 35 mg 35 mg Next INR Date - 04/03/2020 04/22/2020 05/06/2020 The following findings were reported from today's call (no findings if none selected):[]  Missed doses []  Extra doses []  Change in medications []  Change in vitamin k rich food intake (green vegetables, herbals, etc) []  Change in alcohol use []  Change in overall health []  Recent hospitalization / emergency department visit []  Upcoming procedure (including dental procedures) []  Other Comments:       No findings The following side effects were reported from today's call (no side effects if none selected):[]  Bleeding []  New bruising []  Warfarin-emergency department visit or hospital admission []  Other Comments:   No side effects Assessment/Plan:Patient's INR is 2.23, which is within goal range of 2.0-3.0.  Patient reports feeling better, she was able to go to her doctor's appointment and then went to take care of an errand. She's taking ~ 1000 mg to 2000 mg Tylenol daily, amount she is taking is changing, so recommend retesting in 1 week.She was instructed to continue current weekly dose as indicated in the maintenance plan below.  Next INR assessment due in 1 week.   Plan sent to provider for review. Anticoagulation Summary  As of 04/22/2020  INR goal:  2.0-3.0 TTR:  46.2 % (5.9 y) INR used for dosing:  2.23 (04/22/2020) Warfarin maintenance plan:  5 mg (5 mg x 1) every day Weekly warfarin total:  35 mg Plan last modified:  [name], PharmD (03/11/2020) Next INR check:  05/06/2020 Priority:  High Priority Target end date:  Indefinite  Indications  Lupus anticoagulant with hypercoagulable state (HC Code) (HC CODE) [D68.62]Ischemic colitis (HC Code) (HC CODE) [K55.9]   Anticoagulation Episode Summary   INR check location:    Preferred lab:    Send INR reminders to:  [facility] INR POOL  Comments:    Anticoagulation Care Providers   Provider Role Specialty Phone number  [name], MD Referring Medical Oncology [phone]  [place], [name], APRN Responsible Medical Oncology [phone]  All patient's medications have been reviewed and updated as needed.  Electronically Signed by [name], PharmD, December 27, 2021[facility]  [address], [place], [place] [address]  Phone:[phone]  Fax: [phone]

## 2020-05-03 ENCOUNTER — Encounter: Admit: 2020-05-03 | Payer: PRIVATE HEALTH INSURANCE | Attending: Emergency Medicine | Primary: Internal Medicine

## 2020-05-03 ENCOUNTER — Emergency Department: Admit: 2020-05-03 | Payer: PRIVATE HEALTH INSURANCE | Primary: Internal Medicine

## 2020-05-03 ENCOUNTER — Inpatient Hospital Stay
Admit: 2020-05-03 | Discharge: 2020-05-08 | Payer: PRIVATE HEALTH INSURANCE | Source: Home / Self Care | Admitting: Surgery

## 2020-05-03 ENCOUNTER — Inpatient Hospital Stay: Admit: 2020-05-03 | Payer: PRIVATE HEALTH INSURANCE

## 2020-05-03 DIAGNOSIS — D6862 Lupus anticoagulant syndrome: Secondary | ICD-10-CM

## 2020-05-03 DIAGNOSIS — E119 Type 2 diabetes mellitus without complications: Secondary | ICD-10-CM

## 2020-05-03 DIAGNOSIS — K55039 Acute (reversible) ischemia of large intestine, extent unspecified: Secondary | ICD-10-CM

## 2020-05-03 DIAGNOSIS — E7800 Pure hypercholesterolemia, unspecified: Secondary | ICD-10-CM

## 2020-05-03 DIAGNOSIS — R42 Dizziness and giddiness: Secondary | ICD-10-CM

## 2020-05-03 DIAGNOSIS — R109 Unspecified abdominal pain: Secondary | ICD-10-CM

## 2020-05-03 DIAGNOSIS — K436 Other and unspecified ventral hernia with obstruction, without gangrene: Secondary | ICD-10-CM

## 2020-05-03 DIAGNOSIS — L719 Rosacea, unspecified: Secondary | ICD-10-CM

## 2020-05-03 DIAGNOSIS — F32A Depression: Secondary | ICD-10-CM

## 2020-05-03 LAB — HEPATIC FUNCTION PANEL
BKR A/G RATIO: 1.1 (ref 1.0–2.2)
BKR ALANINE AMINOTRANSFERASE (ALT): 16 U/L (ref 10–35)
BKR ALBUMIN: 4.1 g/dL (ref 3.6–4.9)
BKR ALKALINE PHOSPHATASE: 148 U/L — ABNORMAL HIGH (ref 9–122)
BKR ASPARTATE AMINOTRANSFERASE (AST): 24 U/L (ref 10–35)
BKR AST/ALT RATIO: 1.5
BKR BILIRUBIN DIRECT: <0.2 mg/dL (ref <=0.3)
BKR BILIRUBIN TOTAL: 0.3 mg/dL (ref <=1.2)
BKR GLOBULIN: 3.9 g/dL — ABNORMAL HIGH (ref 2.3–3.5)
BKR PROTEIN TOTAL: 8 g/dL (ref 6.6–8.7)

## 2020-05-03 LAB — PT/INR AND PTT (BH GH L LMW YH)
BKR INR: 3.84 — ABNORMAL HIGH (ref 0.87–1.14)
BKR PARTIAL THROMBOPLASTIN TIME: 52.9 s — ABNORMAL HIGH (ref 23.0–31.4)
BKR PROTHROMBIN TIME: 36.6 s — ABNORMAL HIGH (ref 9.6–12.3)

## 2020-05-03 LAB — UA REFLEX CULTURE

## 2020-05-03 LAB — URINALYSIS WITH CULTURE REFLEX      (BH LMW YH)
BKR BILIRUBIN, UA: NEGATIVE
BKR BLOOD, UA: NEGATIVE
BKR KETONES, UA: NEGATIVE
BKR LEUKOCYTE ESTERASE, UA: NEGATIVE
BKR NITRITE, UA: NEGATIVE
BKR PH, UA: 6 (ref 5.5–7.5)
BKR SPECIFIC GRAVITY, UA: 1.037 — ABNORMAL HIGH (ref 1.005–1.030)
BKR UROBILINOGEN, UA: <2 EU/dL (ref <=2.0)

## 2020-05-03 LAB — CBC WITH AUTO DIFFERENTIAL
BKR WAM ABSOLUTE IMMATURE GRANULOCYTES.: 0.11 x 1000/ÂµL (ref 0.00–0.30)
BKR WAM ABSOLUTE LYMPHOCYTE COUNT.: 1.92 x 1000/ÂµL (ref 0.60–3.70)
BKR WAM ABSOLUTE NRBC (2 DEC): 0 x 1000/ÂµL (ref 0.00–1.00)
BKR WAM ANALYZER ANC: 15.03 x 1000/ÂµL — ABNORMAL HIGH (ref 2.00–7.60)
BKR WAM BASOPHIL ABSOLUTE COUNT.: 0.08 x 1000/ÂµL (ref 0.00–1.00)
BKR WAM BASOPHILS: 0.4 % (ref 0.0–1.4)
BKR WAM EOSINOPHIL ABSOLUTE COUNT.: 0.33 x 1000/ÂµL (ref 0.00–1.00)
BKR WAM EOSINOPHILS: 1.8 % (ref 0.0–5.0)
BKR WAM HEMATOCRIT (2 DEC): 42.4 % (ref 35.00–45.00)
BKR WAM HEMOGLOBIN: 13.4 g/dL (ref 11.7–15.5)
BKR WAM IMMATURE GRANULOCYTES: 0.6 % (ref 0.0–1.0)
BKR WAM LYMPHOCYTES: 10.5 % — ABNORMAL LOW (ref 17.0–50.0)
BKR WAM MCH (PG): 26.3 pg — ABNORMAL LOW (ref 27.0–33.0)
BKR WAM MCHC: 31.6 g/dL (ref 31.0–36.0)
BKR WAM MCV: 83.3 fL (ref 80.0–100.0)
BKR WAM MONOCYTE ABSOLUTE COUNT.: 0.77 x 1000/ÂµL (ref 0.00–1.00)
BKR WAM MONOCYTES: 4.2 % (ref 4.0–12.0)
BKR WAM MPV: 11.3 fL (ref 8.0–12.0)
BKR WAM NEUTROPHILS: 82.5 % — ABNORMAL HIGH (ref 39.0–72.0)
BKR WAM NUCLEATED RED BLOOD CELLS: 0 % (ref 0.0–1.0)
BKR WAM PLATELETS: 484 x1000/ÂµL — ABNORMAL HIGH (ref 150–420)
BKR WAM RDW-CV: 15.6 % — ABNORMAL HIGH (ref 11.0–15.0)
BKR WAM RED BLOOD CELL COUNT.: 5.09 M/ÂµL (ref 4.00–6.00)
BKR WAM WHITE BLOOD CELL COUNT: 18.2 x1000/ÂµL — ABNORMAL HIGH (ref 4.0–11.0)

## 2020-05-03 LAB — BASIC METABOLIC PANEL
BKR ANION GAP: 14 (ref 7–17)
BKR BLOOD UREA NITROGEN: 15 mg/dL (ref 8–23)
BKR BUN / CREAT RATIO: 19.5 (ref 8.0–23.0)
BKR CALCIUM: 9.7 mg/dL (ref 8.8–10.2)
BKR CHLORIDE: 97 mmol/L — ABNORMAL LOW (ref 98–107)
BKR CO2: 24 mmol/L (ref 20–30)
BKR CREATININE: 0.77 mg/dL (ref 0.40–1.30)
BKR EGFR (AFR AMER): >60 mL/min/{1.73_m2} (ref >60)
BKR EGFR (NON AFRICAN AMERICAN): >60 mL/min/{1.73_m2} (ref >60)
BKR GLUCOSE: 183 mg/dL — ABNORMAL HIGH (ref 70–100)
BKR POTASSIUM: 4.3 mmol/L (ref 3.3–5.3)
BKR SODIUM: 135 mmol/L — ABNORMAL LOW (ref 136–144)

## 2020-05-03 LAB — LIPASE: BKR LIPASE: 39 U/L (ref 11–55)

## 2020-05-03 LAB — SARS COV-2 (COVID-19) RNA: BKR SARS-COV-2 RNA (COVID-19) (YH): NEGATIVE

## 2020-05-03 LAB — INFLUENZA A+B/RSV BY RT-PCR
BKR INFLUENZA A: NEGATIVE
BKR INFLUENZA B: NEGATIVE
BKR RESPIRATORY SYNCYTIAL VIRUS: NEGATIVE

## 2020-05-03 MED ORDER — GLUCAGON 1 MG/ML IN STERILE WATER
Freq: Once | INTRAMUSCULAR | Status: DC | PRN
Start: 2020-05-03 — End: 2020-05-07

## 2020-05-03 MED ORDER — INSULIN LISPRO 100 UNIT/ML (SLIDING SCALE)
100 unit/mL | Freq: Four times a day (QID) | SUBCUTANEOUS | Status: DC
Start: 2020-05-03 — End: 2020-05-07
  Administered 2020-05-03: 20:00:00 100 mL via SUBCUTANEOUS

## 2020-05-03 MED ORDER — DEXTROSE 50 % IN WATER (D50W) INTRAVENOUS SYRINGE
INTRAVENOUS | Status: DC | PRN
Start: 2020-05-03 — End: 2020-05-07

## 2020-05-03 MED ORDER — SODIUM CHLORIDE 0.9 % BOLUS (NEW BAG)
0.9 % | Freq: Once | INTRAVENOUS | Status: DC | PRN
Start: 2020-05-03 — End: 2020-05-08

## 2020-05-03 MED ORDER — IOHEXOL 350 MG IODINE/ML ORAL
350 mg/mL | Freq: Once | ORAL | Status: CP | PRN
Start: 2020-05-03 — End: ?
  Administered 2020-05-03: 08:00:00 350 mL via ORAL

## 2020-05-03 MED ORDER — DEXTROSE 5 % AND 0.45 % SODIUM CHLORIDE INTRAVENOUS SOLUTION
INTRAVENOUS | Status: DC
Start: 2020-05-03 — End: 2020-05-07
  Administered 2020-05-03 – 2020-05-07 (×9): 1000.000 mL/h via INTRAVENOUS

## 2020-05-03 MED ORDER — HYDROMORPHONE 2 MG/ML INJECTION SOLUTION
2 mg/mL | Freq: Once | INTRAVENOUS | Status: CP
Start: 2020-05-03 — End: ?
  Administered 2020-05-03: 09:00:00 2 mL via INTRAVENOUS

## 2020-05-03 MED ORDER — CAMPHOR-MENTHOL 0.5 %-0.5 % LOTION
Freq: Three times a day (TID) | TOPICAL | Status: DC | PRN
Start: 2020-05-03 — End: 2020-05-08
  Administered 2020-05-03 – 2020-05-04 (×2): via TOPICAL

## 2020-05-03 MED ORDER — SKIM MILK
ORAL | Status: DC | PRN
Start: 2020-05-03 — End: 2020-05-07

## 2020-05-03 MED ORDER — ENOXAPARIN 60 MG/0.6 ML SUBCUTANEOUS SYRINGE
60 mg/0.6 mL | Freq: Two times a day (BID) | SUBCUTANEOUS | Status: DC
Start: 2020-05-03 — End: 2020-05-08
  Administered 2020-05-03 – 2020-05-08 (×10): 60 mg/0.6 mL via SUBCUTANEOUS

## 2020-05-03 MED ORDER — DIPHENHYDRAMINE 50 MG/ML INJECTION SOLUTION
50 mg/mL | Freq: Three times a day (TID) | INTRAVENOUS | Status: DC | PRN
Start: 2020-05-03 — End: 2020-05-08
  Administered 2020-05-04 – 2020-05-08 (×9): 50 mL via INTRAVENOUS

## 2020-05-03 MED ORDER — HYDROMORPHONE 2 MG/ML INJECTION SOLUTION
2 mg/mL | INTRAVENOUS | Status: DC | PRN
Start: 2020-05-03 — End: 2020-05-08
  Administered 2020-05-03 – 2020-05-08 (×14): 2 mL via INTRAVENOUS

## 2020-05-03 MED ORDER — FRUIT JUICE
ORAL | Status: DC | PRN
Start: 2020-05-03 — End: 2020-05-07

## 2020-05-03 MED ORDER — DEXTROSE 40 % ORAL GEL
40 % | ORAL | Status: DC | PRN
Start: 2020-05-03 — End: 2020-05-07

## 2020-05-03 MED ORDER — WARFARIN 5 MG TABLET
5 mg | Freq: Every day | ORAL | Status: AC
Start: 2020-05-03 — End: 2020-07-15

## 2020-05-03 MED ORDER — ONDANSETRON HCL (PF) 4 MG/2 ML INJECTION SOLUTION
4 mg/2 mL | Freq: Once | INTRAVENOUS | Status: CP
Start: 2020-05-03 — End: ?
  Administered 2020-05-03: 08:00:00 4 mL via INTRAVENOUS

## 2020-05-03 MED ORDER — IBUPROFEN 600 MG TABLET
600 mg | Freq: Once | ORAL | Status: CP
Start: 2020-05-03 — End: ?
  Administered 2020-05-03: 06:00:00 600 mg via ORAL

## 2020-05-03 MED ORDER — SODIUM CHLORIDE 0.9 % (FLUSH) INJECTION SYRINGE
0.9 % | INTRAVENOUS | Status: DC | PRN
Start: 2020-05-03 — End: 2020-05-08

## 2020-05-03 MED ORDER — IOHEXOL 350 MG IODINE/ML INTRAVENOUS SOLUTION
350 mg iodine/mL | Freq: Once | INTRAVENOUS | Status: CP | PRN
Start: 2020-05-03 — End: ?
  Administered 2020-05-03: 10:00:00 350 mL via INTRAVENOUS

## 2020-05-03 MED ORDER — ONDANSETRON HCL (PF) 4 MG/2 ML INJECTION SOLUTION
4 mg/2 mL | Freq: Four times a day (QID) | INTRAVENOUS | Status: DC | PRN
Start: 2020-05-03 — End: 2020-05-08
  Administered 2020-05-04 – 2020-05-08 (×5): 4 mL via INTRAVENOUS

## 2020-05-03 MED ORDER — HYDROMORPHONE 2 MG/ML INJECTION SOLUTION
2 mg/mL | INTRAVENOUS | Status: DC | PRN
Start: 2020-05-03 — End: 2020-05-08

## 2020-05-03 MED ORDER — SODIUM CHLORIDE 0.9 % (FLUSH) INJECTION SYRINGE
0.9 % | Freq: Three times a day (TID) | INTRAVENOUS | Status: DC
Start: 2020-05-03 — End: 2020-05-08
  Administered 2020-05-03 – 2020-05-08 (×5): 0.9 mL via INTRAVENOUS

## 2020-05-03 NOTE — ED Notes
1:07 PM

## 2020-05-03 NOTE — ED Notes
1:10 PM  initial contact with pt. Pt aaox3,NGT remains,states pain less at this time,minimal drainage noted.awaiting bed assignment.

## 2020-05-03 NOTE — ED Notes
3:05 AM PIV placed. IV zofran given3:11 AM oral contrast started4:12 AM completed oral contrast. [place] scan informed4:23 AM 0.5 mg IV dilaudid given5:10 AM pt to [place] scan via hospital transport5:47 AM COVID swab sent

## 2020-05-03 NOTE — ED Notes
7:15 AM Assumed care of patient, report received from off-going RNs. Chief Complaint Patient presents with ? Abdominal Pain   3 weeks s/p ventral hernia repair, extensive hx bowel obstructions (last 12/4). Complaining of severe umbilical abdominal pain w/ n/v x 2.5 hours, suspects she has another bowel obtruction.  8:44 AM Meds given per MAR, no concerns at this time.

## 2020-05-03 NOTE — ED Notes
2:51 AM 70 yo female admitted to A side with c/o abd pain starting at 9PM last night. Hx hernia repairs in December and hx of bowel obstruction. Pt had 2 bowel movements earlier today LBM 5PM, does not recall seeing any blood. Pt nausea but denies vomiting. Pt reports inc. abd distention. Reports dec. appetite. Denies SOB, CP, f/c. AOx4 with clear and coherent speech.Past Medical History: Diagnosis Date ? Acute ischemic colitis (HC Code) (HC CODE) 2015 ? Dental crown present   UPPER FRONT CAP ? Depression   Receving treatment at [facility] ? Diabetes mellitus (HC Code) (HC CODE)  ? Fibroid   2003 ? GERD (gastroesophageal reflux disease)   SINCE WT LOSS AND DIET CHANGE PT NO LONGER HAS PROBLEMS WITH GERD ? Hypercholesteremia  ? 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 PONV (postoperative nausea and vomiting)  ? Rosacea  ? Ventral hernia with bowel obstruction  ? Vertigo

## 2020-05-03 NOTE — Plan of Care
Plan of Care Overview/ Patient Status    1520-1900. Patient up from ED. Alert and oriented x4. Calm and cooperative. Independent OOB. VSS. Sating appropriately on room air. NGT in right nostril taped at 55. To low continuous wall suction. 100cc output. NPO. Abdomen firm. Pain to palpitation. Old midline abdominal scar from ventral hernia repair 3 weeks ago. +bs LBM 01/06. +flatus. Voids spontaneously in bathroom. CYO. Safety measures maintained. Call bell within reach. Will continue to monitor. Problem: Adult Inpatient Plan of CareGoal: Plan of Care ReviewOutcome: Interventions implemented as appropriateGoal: Patient-Specific Goal (Individualized)Outcome: Interventions implemented as appropriateGoal: Absence of Hospital-Acquired Illness or InjuryOutcome: Interventions implemented as appropriateGoal: Optimal Comfort and WellbeingOutcome: Interventions implemented as appropriateGoal: Readiness for Transition of CareOutcome: Interventions implemented as appropriate

## 2020-05-03 NOTE — ED Provider Notes
HistoryChief Complaint Patient presents with ? Abdominal Pain   3 weeks s/p ventral hernia repair, extensive hx bowel obstructions (last 12/4). Complaining of severe umbilical abdominal pain w/ n/v x 2.5 hours, suspects she has another bowel obtruction.   The history is provided by the patient and medical records. No language interpreter was used. OtherThis is a recurrent problem. The current episode started 1 to 2 hours ago. The problem occurs rarely. The problem has not changed since onset.Associated symptoms include abdominal pain. Pertinent negatives include no chest pain, no headaches and no shortness of breath. Nothing aggravates the symptoms. Nothing relieves the symptoms. She has tried nothing for the symptoms.  Past Medical History: Diagnosis Date ? Acute ischemic colitis (HC Code) (HC CODE) 2015 ? Depression  ? Diabetes mellitus (HC Code) (HC CODE)  ? Hypercholesteremia  ? 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 Rosacea  ? Ventral hernia with bowel obstruction  ? Vertigo  Past Surgical History: Procedure Laterality Date ? ABDOMINAL ADHESION SURGERY  2016 ? BLEPHAROPLASTY Bilateral 05/2016 ? COLOSTOMY  05/27/13 ? COLOSTOMY CLOSURE   ? KNEE LIGAMENT RECONSTRUCTION Right 2004 ? LAPAROSCOPIC NISSEN FUNDOPLICATION    2000 ? ? SINUS SURGERY   ? THYROID BIOPSY   ? TUBAL LIGATION   ? UMBILICAL HERNIA REPAIR   ? VENTRAL HERNIA REPAIR   Family History Problem Relation Age of Onset ? Bladder cancer Mother  ? Colon cancer Paternal Aunt  ? Colon cancer Cousin       died of colona cancer at age 70 ? Breast cancer Cousin       2 cousins with breast cancer ? Colon cancer Father  Social History Socioeconomic History ? Marital status: Married   Spouse name: Not on file ? Number of children: Not on file ? Years of education: Not on file ? Highest education level: Not on file Tobacco Use ? Smoking status: Never Smoker ? Smokeless tobacco: Never Used Vaping Use ? Vaping Use: Never used Substance and Sexual Activity ? Alcohol use: Yes   Comment: once or twice a year ? Drug use: No ? Sexual activity: Yes   Partners: Male ED Other Social History ? E-cigarette status Never User  ? E-Cigarette Use Never User  E-cigarette/Vaping Substances E-cigarette/Vaping Devices Review of Systems Constitutional: Negative for chills, diaphoresis, fever and unexpected weight change. HENT: Negative for congestion, postnasal drip, rhinorrhea, sore throat and trouble swallowing.  Eyes: Negative for visual disturbance. Respiratory: Negative for cough, chest tightness and shortness of breath.  Cardiovascular: Negative for chest pain and leg swelling. Gastrointestinal: Positive for abdominal pain and nausea. Negative for abdominal distention, blood in stool, diarrhea and vomiting. Genitourinary: Negative for dysuria, flank pain, hematuria, vaginal bleeding and vaginal discharge. Musculoskeletal: Negative for back pain and neck stiffness. Skin: Negative for color change. Neurological: Negative for dizziness, syncope, light-headedness and headaches. All other systems reviewed and are negative. Physical ExamED Triage VitalsBP: 129/67 [05/02/20 2331]Pulse: (!) 102 [05/02/20 2331]Pulse from  O2 sat: 84 [05/03/20 0317]Resp: 16 [05/02/20 2331]Temp: 98 ?F (36.7 ?C) [05/02/20 2331]Temp src: Oral [05/02/20 2331]SpO2: 100 % [05/02/20 2331] BP (!) 115/53  - Pulse 77  - Temp 98.9 ?F (37.2 ?C) (Oral)  - Resp 18  - SpO2 98% Physical ExamVitals reviewed. Constitutional:     General: She is not in acute distress.   Appearance: She is well-developed. She is not diaphoretic. HENT:    Head: Normocephalic.    Right Ear: External ear normal.    Left Ear: External ear  normal.    Nose: Nose normal.    Mouth/Throat:    Pharynx: No oropharyngeal exudate. Eyes:    General: No scleral icterus.      Right eye: No discharge.       Left eye: No discharge.    Conjunctiva/sclera: Conjunctivae normal. Cardiovascular:    Rate and Rhythm: Normal rate and regular rhythm. Pulmonary:    Effort: Pulmonary effort is normal. No respiratory distress.    Breath sounds: Normal breath sounds. Abdominal:    General: Abdomen is protuberant. Bowel sounds are normal. There is distension.    Palpations: Abdomen is soft.    Tenderness: There is generalized abdominal tenderness. Musculoskeletal:       General: Normal range of motion.    Cervical back: Normal range of motion and neck supple. Lymphadenopathy:    Cervical: No cervical adenopathy. Skin:   General: Skin is warm and dry.    Capillary Refill: Capillary refill takes less than 2 seconds. Neurological:    Mental Status: She is alert and oriented to person, place, and time. Psychiatric:       Behavior: Behavior normal.  ProceduresProcedures  PGY-3 Emergency Medicine Resident MDM: ----------------------------------------------------------------------------Presentation:  Pt is a 70 y.o. female w pmh DM II,  multiple abdominal surgeries, SBO, last surgery in 03/2020; lysis of adhesions and ventral hernia repair presents with diffuse abdominal pain. Last BM yesterday and passing gas. Denies fevers, chills. +nausea. - remaining review of systems and physical exam as detailed belowMDM/COURSE:- Vital signs at time of exam not requiring immediate intervention. Exam notable for distended abdomen, TTP. Incision site without signs of infection. DDx: SBO vs abscess vs infection vs constipation vs ileusPlan:LabsCT Re-eval:Patrick notable for SBO and fluid collection at surgical siteSurgery consultedDispo:Admit to surgeryThis patient was presented to and discussed with Dr. Janice Norrie and a treatment plan and disposition were collaboratively agreed upon.Donnie Mesa, M.D.Emergency MedicinePGY-37:53 AM---------------------------------------------------------------------------------------------------------------------ED COURSEReviewed previous: previous chartInterpreted by ED Provider: labs and [place] scanConsults: SurgeryPatient Reevaluation: Attending Supervised: ResidentI saw and examined the patient. I agree with the findings and plan of care as documented in the resident's note. Of note 74 F hx of recurrent ventral hernia and SBO, s/p ventral hernia repair and lysis of adhesions 04/09/20 by Dr. [name], tonight with abd pain and N/VAfebrile, VSS, abd softWBC 18CT high grade SBOSeen and admitted to surgery, Dr. Quinn Plowman R FrenchCritical care provided by attending: no critical carePatient progress: stableClinical Impressions as of May 04 835 SBO (small bowel obstruction) (HC Code) [facility] CODE)  ED DispositionAdmit Roney Marion, MD MPH01/07/22 0725 Valentino Hue, MDResident01/07/22 8119 Roney Marion, MD MPH01/07/22 [phone]

## 2020-05-03 NOTE — Utilization Review (ED)
UM Status: Managed Medicare IP 30 Day Readmit for recurrent SBO with WBC 18.2, plan IV Antibiotic, IP appropriate

## 2020-05-03 NOTE — Other
05/03/2020 2:05 pmSerial abdominal examS: pt states she has + flatus, less nausea and decreased discomfort Vitals:98.6 ?F (37 ?C)  69  18  111/47?Abnormal?  96 %  Lying  room air  -- O: NGT with 100 clear, slight tan thin liquid in collection canister The abd remains with distention, mild non-point tenderness discomfort A:having some return of bowel function with pSBOP: cont NGTNPOIVFCont to follow exams and serology Channing Mutters PA-CDepartment of SurgeryCell [phone]

## 2020-05-03 NOTE — H&P
[facility] Consult Note Consult Information: Reason for consultation: SBOSource of Information: EMR, patientPresentation History: This is a 70 y.o. female, with a h/o DM, phospholipid-multiple abdominal surgeries including extended L hemi-colectomy with colostomy/reversal (2015, Dr.Barcewicz), multiple VHRs, multiple SBOs, recently admitted 03/2020 with SBO which resolved now s/p RUQ VHR 04/09/20 (Dr.Zuckerman and Dr.O'Brien), who presents with several hours of abdominal pain nausea, and emesis concerning for recurrent obstruction. The patient has been doing well overall since surgery. She has had some occasional RUQ pain when getting out of her chair but otherwise is eating well and having bowel movements without issues. At 9pm last night she began having epigastric pain which then became lower and was associated with nausea. The patient's pain progressed and she felt that these symptoms were similar to her prior obstructions which prompted her to present around 12am. She has been passing flatus in the ED but also burping. Her last BM was 5pm yesterday. She denies fevers, chills, or URI symptoms.She continues on warfarin at home. Review of Allergies/Medical Hx/Surgical Hx/Family Hx/Social Hx /Medications: PAST MEDICAL HISTORY: She has a past medical history of Acute ischemic colitis (HC Code) (HC CODE) (2015), Depression, Diabetes mellitus (HC Code) (HC CODE), Hypercholesteremia, Lupus anticoagulant disorder (HC Code) (HC CODE), Rosacea, Ventral hernia with bowel obstruction, and Vertigo.PAST SURGICAL HISTORY: She has a past surgical history that includes Knee ligament reconstruction (Right, 2004); Laparoscopic Nissen fundoplication; Umbilical hernia repair; Ventral hernia repair; Tubal ligation; THYROID BIOPSY; 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FAMILY HISTORY: Her family history includes Bladder cancer in her mother; Breast cancer in her cousin; Colon cancer in her cousin, father, and paternal aunt.SOCIAL HISTORY: She reports that she has never smoked. She has never used smokeless tobacco. She reports current alcohol use. She reports that she does not use drugs.Allergies Allergen Reactions ? Morphine Hives and Itching   Pt tolerated morphine during admission 08/08/16-08/09/16.  Pt premedicated with diphenhydramine ? Biaxin  [Clarithromycin]  ? Latex, Natural Rubber Rash CURRENT MEDICATIONS: No current facility-administered medications on file prior to encounter. Current Outpatient Medications on File Prior to Encounter Medication Sig Dispense Refill ? acetaminophen (TYLENOL) 500 mg tablet Take 1 tablet (500 mg total) by mouth every 4 (four) hours as needed. 30 tablet 11 ? ascorbic acid (VITAMIN C ORAL) Take 1 tablet by mouth daily.   ? ascorbic acid-collagen (COLLAGEN PLUS VITAMIN C) 125-740 mg Cap Take 1 capsule by mouth daily.    ? biotin 1 mg tablet Take 1 tablet by mouth daily.    ? cholecalciferol, vitamin D3, (VITAMIN D3) 5,000 unit Tab Take 1 tablet by mouth every other day..   ? gabapentin (NEURONTIN) 100 mg capsule Take 100 mg by mouth at bedtime.   ? JARDIANCE 25 mg tablet 1 TABLET BY MOUTH ONCE DAILY IN THE MORNING FOR 30 DAYS   ? meclizine (ANTIVERT) 12.5 mg tablet Take 1 tablet (12.5 mg total) by mouth daily as needed for Dizziness 30 tablet 1 ? melatonin 10 mg Tab Take 10 mg by mouth nightly as needed.    ? multivitamin tablet Take 1 tablet by mouth daily.   ? olopatadine (PATADAY) 0.2 % Drop Place 1 drop into both eyes daily 2.5 mL 6 ? ondansetron (ZOFRAN) 4 MG tablet Take 4 mg by mouth every 8 (eight) hours as needed for Nausea.   ? oxyCODONE (ROXICODONE) 5 mg Immediate Release tablet Take 1 tablet (5 mg total) by mouth every 4 (four) hours  as needed. 8 tablet 0 ? phytonadione, vit K1, (VITAMIN K) 100 mcg tablet Take 100 mcg by mouth daily.   ? polyethylene glycol (MIRALAX) 17 gram packet Take 1 packet (17 g total) by mouth daily. Mix in 8 ounces of water, juice, soda, coffee or tea prior to taking. 14 each 2 ? prasterone, dhea,-calcium carb (DHEA) 10 mg-47 mg calcium Tab Take by mouth.   ? pravastatin (PRAVACHOL) 40 MG tablet Take 40 mg by mouth nightly.   ? senna (SENOKOT) 8.6 mg tablet Take 2 tablets by mouth nightly.. 30 tablet 11 ? sitaGLIPtin (JANUVIA) 100 MG tablet Take 100 mg by mouth daily..   ? sodium chloride (SODIUM CHLORIDE) 0.65 % nasal spray 1 spray by Each Nare route as needed for Congestion..    ? venlafaxine (EFFEXOR XR) 150 mg XR 24 hr extended release capsule Take 150 mg by mouth daily.    Review of Systems: Negative except per HPIObjective Vitals:Temp:  [98 ?F (36.7 ?C)-98.9 ?F (37.2 ?C)] 98.9 ?F (37.2 ?C)Pulse:  [79-102] 79Resp:  [16-18] 18BP: (125-140)/(53-67) 125/53SpO2:  [97 %-100 %] 98 %Device (Oxygen Therapy): room airIntake/Output:No intake or output data in the 24 hours ending 05/03/20 0631No intake/output data recorded.Physical Exam:GEN: NADNEURO: normal strength and sensationCV: regular ratePULM: normal inspiratory effort on room airABD: soft, distended, nontender, incision c/d/i, no ovelrying skin changes. RUQ tendernessEXT: warm, well perfusedLab Review:Recent Labs Lab 01/07/220020 WBC 18.2* HGB 13.4 HCT 42.40 PLT 484* Recent Labs Lab 01/07/220020 NA 135* K 4.3 CL 97* CO2 24 BUN 15 CREATININE 0.77 GLU 183* CALCIUM 9.7 Recent Labs Lab 01/07/220020 ALT 16 AST 24 ALKPHOS 148* BILITOT 0.3 BILIDIR <0.2 LIPASE 39 No results for input(s): PROTIME, PTT, INR in the last 168 hours.Diagnostic Review:No results found.Impression: This is a 70 y.o. female with h/o multiple abdominal surgeries including extended L hemi-colectomy with colostomy/reversal (2015, Dr.Barcewicz), multiple VHRs, multiple SBOs, recently admitted 03/2020 with SBO which resolved now s/p RUQ VHR 04/09/20 (Dr.Zuckerman and Dr.O'Brien), who presents with several hours of abdominal pain nausea, and emesis concerning for recurrent obstruction. This appears to be a partial obstruction both clinically and radiographically but will plan for admission and bowel rest. Recommendations:?	Admit to Dr.O'Brien, floor?	PRN pain and nausea control?	NPO, IVF?	Will start therapeutic lovenox and hold warfarin while NPO?	Will consider ABX given WBC 18 but anterior abdominal fluid collection is likely post-op seroma?	SSI?	Coags ?	F/u COVIDDiscussed with Dr.O'Brien (attending)Signed: Janeece Riggers, MD1/10/2020, 6:31 AMPatient well known to [place] is here with abd discomfortafeb vssabd slightly distended and some tendernessWBC 18CT sbo  Fluid collectionWill plan to admitNPONGTResuscitateIf clinically doesn't improve may consider draining the collection but most likely her pain and wbc from SBOI will be out of town but Dr Rebeca Alert to cover on the weekend

## 2020-05-03 NOTE — Plan of Care
Problem: Adult Inpatient Plan of CareGoal: Readiness for Transition of CareOutcome: Interventions implemented as appropriate Plan of Care Overview/ Patient Status    70 y.o. female, with a h/o DM, phospholipid-multiple abdominal surgeries including extended L hemi-colectomy with colostomy/reversal (2015, Dr.Barcewicz), multiple VHRs, multiple SBOs, recently admitted 03/2020 with SBO which resolved now s/p RUQ VHR 04/09/20 (Dr.Zuckerman and Dr.O'Brien), who presents with several hours of abdominal pain nausea, and emesis concerning for recurrent obstructionCase Management Evaluation    Most Recent Value Case Management Evaluation and Plan Arrived from prior to admission home/apartment/condo Do you have a caregiver? No  Lives With Spouse, Mother Services Prior to Admission none Patient Requires Care Coordination Intervention Due To readmission within the last 30 days Documented Insurance Accurate Yes Any financial concerns related to anticipated discharge needs Yes Patient's home address verified Yes Patient's PCP of record verified Yes Last Date Seen by PCP 0-3 months Living Environment  Lives With Spouse, Mother Source of Clinical History Patient's clinical history has been reviewed and source of Information is: Patient Case Production designer, theatre/television/film Attestation: Choose which ONE is appropriate for you I have reviewed the medical record and completed the above evaluation with the following recommendations. Yes Discharge Planning Coordination Recommendations Discharge Planning Coordination Recommendations Needs not determined at this time Case Manager reviewed plan of care/ continuum of care need's with  Patient  Assessment screening completed. Continue to follow patient's progress and discuss plan of care with treatment team.  No discharge needs identified at this time. Care Management will continue to follow with team.Toni [facility], RN [facility] ManagerYNHH Care Management DepartmentP: [phone]: [phone]

## 2020-05-04 LAB — CBC WITH AUTO DIFFERENTIAL
BKR WAM ABSOLUTE IMMATURE GRANULOCYTES.: 0.02 x 1000/ÂµL (ref 0.00–0.30)
BKR WAM ABSOLUTE LYMPHOCYTE COUNT.: 1.72 x 1000/ÂµL (ref 0.60–3.70)
BKR WAM ABSOLUTE NRBC (2 DEC): 0 x 1000/ÂµL (ref 0.00–1.00)
BKR WAM ANALYZER ANC: 5.66 x 1000/ÂµL (ref 2.00–7.60)
BKR WAM BASOPHIL ABSOLUTE COUNT.: 0.03 x 1000/ÂµL (ref 0.00–1.00)
BKR WAM BASOPHILS: 0.4 % (ref 0.0–1.4)
BKR WAM EOSINOPHIL ABSOLUTE COUNT.: 0.32 x 1000/ÂµL (ref 0.00–1.00)
BKR WAM EOSINOPHILS: 3.8 % (ref 0.0–5.0)
BKR WAM HEMATOCRIT (2 DEC): 35.6 % (ref 35.00–45.00)
BKR WAM HEMOGLOBIN: 11.2 g/dL — ABNORMAL LOW (ref 11.7–15.5)
BKR WAM IMMATURE GRANULOCYTES: 0.2 % (ref 0.0–1.0)
BKR WAM LYMPHOCYTES: 20.5 % (ref 17.0–50.0)
BKR WAM MCH (PG): 26.3 pg — ABNORMAL LOW (ref 27.0–33.0)
BKR WAM MCHC: 31.5 g/dL (ref 31.0–36.0)
BKR WAM MCV: 83.6 fL (ref 80.0–100.0)
BKR WAM MONOCYTE ABSOLUTE COUNT.: 0.63 x 1000/ÂµL (ref 0.00–1.00)
BKR WAM MONOCYTES: 7.5 % (ref 4.0–12.0)
BKR WAM MPV: 11.4 fL (ref 8.0–12.0)
BKR WAM NEUTROPHILS: 67.6 % (ref 39.0–72.0)
BKR WAM NUCLEATED RED BLOOD CELLS: 0 % (ref 0.0–1.0)
BKR WAM PLATELETS: 381 x1000/ÂµL (ref 150–420)
BKR WAM RDW-CV: 15.4 % — ABNORMAL HIGH (ref 11.0–15.0)
BKR WAM RED BLOOD CELL COUNT.: 4.26 M/ÂµL (ref 4.00–6.00)
BKR WAM WHITE BLOOD CELL COUNT: 8.4 x1000/ÂµL (ref 4.0–11.0)

## 2020-05-04 LAB — BASIC METABOLIC PANEL
BKR ANION GAP: 8 (ref 7–17)
BKR BLOOD UREA NITROGEN: 10 mg/dL (ref 8–23)
BKR BUN / CREAT RATIO: 12.7 (ref 8.0–23.0)
BKR CALCIUM: 9.1 mg/dL (ref 8.8–10.2)
BKR CHLORIDE: 102 mmol/L (ref 98–107)
BKR CO2: 28 mmol/L (ref 20–30)
BKR CREATININE: 0.79 mg/dL (ref 0.40–1.30)
BKR EGFR (AFR AMER): >60 mL/min/{1.73_m2} (ref >60)
BKR EGFR (NON AFRICAN AMERICAN): >60 mL/min/{1.73_m2} (ref >60)
BKR GLUCOSE: 142 mg/dL — ABNORMAL HIGH (ref 70–100)
BKR POTASSIUM: 4.8 mmol/L (ref 3.3–5.3)
BKR SODIUM: 138 mmol/L (ref 136–144)

## 2020-05-04 LAB — PHOSPHORUS     (BH GH L LMW YH): BKR PHOSPHORUS: 3.7 mg/dL (ref 2.2–4.5)

## 2020-05-04 LAB — MAGNESIUM: BKR MAGNESIUM: 2.1 mg/dL (ref 1.7–2.4)

## 2020-05-04 MED ORDER — ACETAMINOPHEN 1,000 MG/100 ML (10 MG/ML) INTRAVENOUS SOLUTION
10 mg/mL | Freq: Three times a day (TID) | INTRAVENOUS | Status: CP | PRN
Start: 2020-05-04 — End: ?
  Administered 2020-05-04 – 2020-05-05 (×2): 10 mL/h via INTRAVENOUS

## 2020-05-04 NOTE — Plan of Care
Plan of Care Overview/ Patient Status    0700-1900.Neuro:  Alert and oriented x4. Calm and cooperative. Independent OOB.C/V: VSS. Resp: Sating appropriately on room air. NGT in right nostril taped at 55. To low continuous wall suction. 300cc output. NPO.GI/ GU: Abdomen Tender. Pain to palpitation. Old midline abdominal scar from ventral hernia repair 3 weeks ago. +bs LBM 01/06. +flatus. Voids spontaneously in bathroom. CYO. Pain: Pain was being managed with IV dilaudid. Patient reports extreme itching. Benadryl given with positive effect. Patient now refusing dilaudid but In a lot of pain. Team notified no other options for pain management at this time. 1700: IV Tylenol ordered. Tylenol given w/ positive effect. Patient ambulated multiple laps around unit today. Safety measures maintained. Call bell within reach. Will continue to monitor.

## 2020-05-04 NOTE — Progress Notes
Maria [name]	 Maria [name]	Surgery Progress NoteAttending Provider: Virl Diamond., MDSynopsis:70 y.o. female, with a h/o DM, phospholipid-multiple abdominal surgeries including extended L hemi-colectomy with colostomy/reversal (2015, Dr.Barcewicz), multiple VHRs, multiple SBOs, recently admitted 03/2020 with SBO which resolved now s/p RUQ VHR 04/09/20 (Dr.Zuckerman and Dr.O'Brien), who presents with several hours of abdominal pain nausea, and emesis concerning for recurrent obstruction. Hospital LOS: 1 day Interim Hx: No acute eventsAfebrile, HDSPain controlledNGT minimal outputPassing flatus no BMReview of Allergies/Meds/Hx:I have reviewed the patient's: allergies, current scheduled medications, current infusions and current prn medicationsObjective:Vitals:Last 24 hours: Temp:  [97.5 ?F (36.4 ?C)-98.6 ?F (37 ?C)] 97.9 ?F (36.6 ?C)Pulse:  [69-89] 77Resp:  [17-20] 20BP: (111-136)/(47-79) 122/73SpO2:  [96 %-100 %] 99 %I/O's:I/O last 3 completed shifts:In: 1501.3 [I.V.:1501.3]Out: 300 [Other:300]Physical Exam:Gen: NADHEENT: NGT with minimal salivary contentsCV: RRRResp: Normal respiratory effortAbd: Soft, non-distended, minimally tender over right midline, incision healing wellExt: wwpLabs:CBC:Recent Labs Lab 01/07/220020 01/08/220450 WBC 18.2* 8.4 HGB 13.4 11.2* HCT 42.40 35.60 PLT 484* 381 Metabolic panel:Recent Labs Lab 01/07/220020 01/08/220450 NA 135* 138 K 4.3 4.8 CL 97* 102 CO2 24 28 BUN 15 10 CREATININE 0.77 0.79 CALCIUM 9.7 9.1 MG  --  2.1 PHOS  --  3.7 Recent Labs Lab 01/07/221736 01/08/220114 01/08/220450 01/08/220508 GLU 108* 108* 142* 117* Coags:Recent Labs Lab 01/07/220826 PTT 52.9* INR 3.84* LFTs/Panc:Recent Labs Lab 01/07/220020 ALT 16 AST 24 ALKPHOS 148* BILITOT 0.3 BILIDIR <0.2 Nutrition:Recent Labs Lab 01/07/220020 ALBUMIN 4.1 PROT 8.0 Micro:Lab Results Component Value Date  LABBLOO  06/06/2013   No growth after 5 days of incubation.All Blood Culture Bottles with growth will be reported  LABBLOO  05/29/2013   No growth after 5 days of incubation.All Blood Culture Bottles with growth will be reported  LABBLOO  05/29/2013   No growth after 5 days of incubation.All Blood Culture Bottles with growth will be reported  LABURIN  06/16/2016   Less than 10,000 CFU/mL. Clinical significance is unlikely for organism(s) present in quantities of less than 10,000 CFU/mL.  LABURIN Less than 10,000 cfu/mL 06/04/2013  LABURIN No Growth 05/29/2013  LABURIN No Growth 05/27/2013  LOWERRESPIRA 2+ Normal Flora 05/29/2013 	Diagnostics:XR Chest PA or APResult Date: 1/7/2022Enteric tube terminates in the superior gastric fundus with the tip directed cephalad. Reported And Signed By: Maudie Flakes, MD  [facility] Radiology and Biomedical ImagingECG/Tele Events: No ECG ordered todayAssessment:Maria [name] is a 70 y.o. year-old female with multiple abdominal surgeries including left hemicolectomy and ostomy reversal and multiple VHR now s/p VHR on 03/2020 presenting with abdominal pain and nausea consistent with SBO is currently passing flatus with no BM with minimal NGT output.Plan:- pain control with oxycodone- Diet: NPO- NGT to LCWS, possible removal today- mIVF- no abx, WBC downtrending- labs- home meds as appropriate- DVT ppx: LMW heparin therapeutic (warfarin for A fib)- GI ppx: none- OOB/ambulate- incentive spirometrySigned:Grae Leathers Coralie Carpen, MD7:25 AM01/08/22General Surgery, PGY-3

## 2020-05-04 NOTE — Plan of Care
Plan of Care Overview/ Patient Status    Pt AOx4. VSS on RA. SBP goal <160 maintained. VS. NGT taped to 55cm to LCWS, see flowsheets for details about output. Prn zofran given for nausea w/ positive effect. No vomiting. Pt npo. No bm this shift. (+) flatus. RUQ, periumbilical, & epigastric area tender on palpation. IVF continued. Dilaudid given for pain with positive effect. Prn benadryl given for itchiness after dilaudid. Pt independent oob. Comfort and safety maintained.

## 2020-05-04 NOTE — Progress Notes
[name] Progress NoteHPI: Not sure if she is passing flatusMedical History: PMH and PSH Past Medical History: Diagnosis Date ? Acute ischemic colitis (HC Code) (HC CODE) 2015 ? Depression  ? Diabetes mellitus (HC Code) (HC CODE)  ? Hypercholesteremia  ? 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 Rosacea  ? Ventral hernia with bowel obstruction  ? Vertigo   Past Surgical History: Procedure Laterality Date ? ABDOMINAL ADHESION SURGERY  2016 ? BLEPHAROPLASTY Bilateral 05/2016 ? COLOSTOMY  05/27/13 ? COLOSTOMY CLOSURE   ? KNEE LIGAMENT RECONSTRUCTION Right 2004 ? LAPAROSCOPIC NISSEN FUNDOPLICATION    2000 ? ? SINUS SURGERY   ? THYROID BIOPSY   ? TUBAL LIGATION   ? UMBILICAL HERNIA REPAIR   ? VENTRAL HERNIA REPAIR    Social History Family History Social History Tobacco Use ? Smoking status: Never Smoker ? Smokeless tobacco: Never Used Substance Use Topics ? Alcohol use: Yes   Comment: once or twice a year  Family History Problem Relation Age of Onset ? Bladder cancer Mother  ? Colon cancer Paternal Aunt  ? Colon cancer Cousin       died of colona cancer at age 83 ? Breast cancer Cousin       2 cousins with breast cancer ? Colon cancer Father   Prior to Admission Medications Medications Prior to Admission Medication Sig Dispense Refill Last Dose ? acetaminophen (TYLENOL) 500 mg tablet Take 1 tablet (500 mg total) by mouth every 4 (four) hours as needed. 30 tablet 11  ? ascorbic acid (VITAMIN C ORAL) Take 1 tablet by mouth daily.    ? ascorbic acid-collagen (COLLAGEN PLUS VITAMIN C) 125-740 mg Cap Take 1 capsule by mouth daily.     ? biotin 1 mg tablet Take 1 tablet by mouth daily.     ? cholecalciferol, vitamin D3, (VITAMIN D3) 5,000 unit Tab Take 1 tablet by mouth every other day..    ? gabapentin (NEURONTIN) 100 mg capsule Take 100 mg by mouth at bedtime.    ? JARDIANCE 25 mg tablet 1 TABLET BY MOUTH ONCE DAILY IN THE MORNING FOR 30 DAYS    ? meclizine (ANTIVERT) 12.5 mg tablet Take 1 tablet (12.5 mg total) by mouth daily as needed for Dizziness 30 tablet 1  ? melatonin 10 mg Tab Take 10 mg by mouth nightly as needed.     ? multivitamin tablet Take 1 tablet by mouth daily.    ? olopatadine (PATADAY) 0.2 % Drop Place 1 drop into both eyes daily 2.5 mL 6  ? ondansetron (ZOFRAN) 4 MG tablet Take 4 mg by mouth every 8 (eight) hours as needed for Nausea.    ? oxyCODONE (ROXICODONE) 5 mg Immediate Release tablet Take 1 tablet (5 mg total) by mouth every 4 (four) hours as needed. 8 tablet 0  ? phytonadione, vit K1, (VITAMIN K) 100 mcg tablet Take 100 mcg by mouth daily.    ? polyethylene glycol (MIRALAX) 17 gram packet Take 1 packet (17 g total) by mouth daily. Mix in 8 ounces of water, juice, soda, coffee or tea prior to taking. 14 each 2  ? prasterone, dhea,-calcium carb (DHEA) 10 mg-47 mg calcium Tab Take by mouth.    ? pravastatin (PRAVACHOL) 40 MG tablet Take 40 mg by mouth nightly.    ? senna (SENOKOT) 8.6 mg tablet Take 2 tablets by mouth nightly.. 30 tablet 11  ? sitaGLIPtin (JANUVIA) 100 MG tablet Take 100 mg by mouth daily.[name]    ?  sodium chloride (SODIUM CHLORIDE) 0.65 % nasal spray 1 spray by Each Nare route as needed for Congestion..     ? venlafaxine (EFFEXOR XR) 150 mg XR 24 hr extended release capsule Take 150 mg by mouth daily.     ? warfarin (COUMADIN) 5 mg tablet Take 5 mg by mouth Daily @1800 .     Allergies Allergies Allergen Reactions ? Morphine Hives and Itching   Pt tolerated morphine during admission 08/08/16-08/09/16.  Pt premedicated with diphenhydramine ? Biaxin  [Clarithromycin]  ? Latex, Natural Rubber Rash  Family History: non-contributory Review of Systems: ROS: no SOB or CP Objective: Vitals: I have reviewed the patient's current vital signs as documented in the patient's EMR.   Physical Exam: GEN: NAD, A&O CV: RRR RESP: CTAB ABD: Soft, NT,still distended,bowel sounds barely establishedAssessment: Small bowel obstructionPlan: Patient had extensive lysis of adhesionsand repair recurrent hernia a month ago and has had multiple abdominal surgeries.Wbc is 8.4 today.Proceed cautiously:keep npo and ngt todayAmbulateSigned: Estill Bamberg, MD 05/04/2020 12:59 PM

## 2020-05-05 LAB — BASIC METABOLIC PANEL
BKR ANION GAP: 12 (ref 7–17)
BKR BLOOD UREA NITROGEN: 7 mg/dL — ABNORMAL LOW (ref 8–23)
BKR BUN / CREAT RATIO: 9.6 (ref 8.0–23.0)
BKR CALCIUM: 9.1 mg/dL (ref 8.8–10.2)
BKR CHLORIDE: 101 mmol/L (ref 98–107)
BKR CO2: 28 mmol/L (ref 20–30)
BKR CREATININE: 0.73 mg/dL (ref 0.40–1.30)
BKR EGFR (AFR AMER): >60 mL/min/{1.73_m2} (ref >60)
BKR EGFR (NON AFRICAN AMERICAN): >60 mL/min/{1.73_m2} (ref >60)
BKR GLUCOSE: 128 mg/dL — ABNORMAL HIGH (ref 70–100)
BKR POTASSIUM: 3.4 mmol/L (ref 3.3–5.3)
BKR SODIUM: 141 mmol/L (ref 136–144)

## 2020-05-05 LAB — MAGNESIUM: BKR MAGNESIUM: 2 mg/dL (ref 1.7–2.4)

## 2020-05-05 LAB — CBC WITH AUTO DIFFERENTIAL
BKR WAM ABSOLUTE IMMATURE GRANULOCYTES.: 0.03 x 1000/ÂµL (ref 0.00–0.30)
BKR WAM ABSOLUTE LYMPHOCYTE COUNT.: 1.64 x 1000/ÂµL (ref 0.60–3.70)
BKR WAM ABSOLUTE NRBC (2 DEC): 0 x 1000/ÂµL (ref 0.00–1.00)
BKR WAM ANALYZER ANC: 4.74 x 1000/ÂµL (ref 2.00–7.60)
BKR WAM BASOPHIL ABSOLUTE COUNT.: 0.03 x 1000/ÂµL (ref 0.00–1.00)
BKR WAM BASOPHILS: 0.4 % (ref 0.0–1.4)
BKR WAM EOSINOPHIL ABSOLUTE COUNT.: 0.24 x 1000/ÂµL (ref 0.00–1.00)
BKR WAM EOSINOPHILS: 3.4 % (ref 0.0–5.0)
BKR WAM HEMATOCRIT (2 DEC): 38.9 % (ref 35.00–45.00)
BKR WAM HEMOGLOBIN: 12.5 g/dL (ref 11.7–15.5)
BKR WAM IMMATURE GRANULOCYTES: 0.4 % (ref 0.0–1.0)
BKR WAM LYMPHOCYTES: 22.9 % (ref 17.0–50.0)
BKR WAM MCH (PG): 26.9 pg — ABNORMAL LOW (ref 27.0–33.0)
BKR WAM MCHC: 32.1 g/dL (ref 31.0–36.0)
BKR WAM MCV: 83.8 fL (ref 80.0–100.0)
BKR WAM MONOCYTE ABSOLUTE COUNT.: 0.48 x 1000/ÂµL (ref 0.00–1.00)
BKR WAM MONOCYTES: 6.7 % (ref 4.0–12.0)
BKR WAM MPV: 11 fL (ref 8.0–12.0)
BKR WAM NEUTROPHILS: 66.2 % (ref 39.0–72.0)
BKR WAM NUCLEATED RED BLOOD CELLS: 0 % (ref 0.0–1.0)
BKR WAM PLATELETS: 378 x1000/ÂµL (ref 150–420)
BKR WAM RDW-CV: 15.1 % — ABNORMAL HIGH (ref 11.0–15.0)
BKR WAM RED BLOOD CELL COUNT.: 4.64 M/ÂµL (ref 4.00–6.00)
BKR WAM WHITE BLOOD CELL COUNT: 7.2 x1000/ÂµL (ref 4.0–11.0)

## 2020-05-05 LAB — PHOSPHORUS     (BH GH L LMW YH): BKR PHOSPHORUS: 3.1 mg/dL (ref 2.2–4.5)

## 2020-05-05 NOTE — Plan of Care
Plan of Care Overview/ Patient Status    Pt AOx4. VSS on RA. SBP goal <160 maintained. VS. NGT taped to 55cm to LCWS, see flowsheets for details about output.no nausea. No vomiting. Pt npo. No bm this shift. (+) flatus.epigastric area soft but tender on palpation. IVF continued. Prn dilaudid given for pain with positive effect. Prn benadryl given as a prophylactic for itchiness. Prn zofran given for nausea as a prophylactic. Pt independent oob and walked several laps around unit tonight. Comfort and safety maintained.

## 2020-05-05 NOTE — Other
PHARMACY-ASSISTED MEDICATION REPORTPharmacist review of the best possible medication history obtained by the pharmacy medication history technician has been performed.  I have updated the home medication list and identified the following information that may be relevant to this admission.NOTES/RECOMMENDATIONSReviewed outpatient med list below and compared it to current inpatient medication list.  No suggestions/additions at this time.Would suggest to please consider resuming home medications as clinically indicated.       Prior to Admission Medications Medication Name Sig Taking? Patient Reported   acetaminophen (TYLENOL) 500 mg tabletLast dose:  --  Take 1 tablet (500 mg total) by mouth every 4 (four) hours as needed. Yes Yes   ascorbic acid (VITAMIN C ORAL)Last dose:  -- Last Medication Note: >> Doloris Hall   ZOX Jul 16, 2018  6:52 PM Entered by Ellard Artis, Amarylis, CPHT Sat Jul 16, 2018 0960 Take 1 tablet by mouth daily. Yes Yes   ascorbic acid-collagen (COLLAGEN PLUS VITAMIN C) 125-740 mg CapLast dose:  -- Last Medication Note: >> Doloris Hall   Sat Jul 16, 2018  6:52 PM Entered by Ellard Artis, Amarylis, CPHT Sat Jul 16, 2018 4540 Take 1 capsule by mouth daily.  Yes Yes   biotin 1 mg tabletLast dose:  -- Last Medication Note: >> Doloris Hall   JWJ Jul 16, 2018  6:52 PM Entered by Ellard Artis, Amarylis, CPHT Sat Jul 16, 2018 1914 Take 1 tablet by mouth daily.  Yes Yes   cholecalciferol, vitamin D3, (VITAMIN D3) 5,000 unit TabLast dose:  -- Last Medication Note: >> Durwin Glaze   NWG Nov 21, 2016 10:17 AM Entered by Durwin Glaze, PharmD Sat Nov 21, 2016 1017 Take 1 tablet by mouth every other day.. Yes Yes   gabapentin (NEURONTIN) 100 mg capsuleLast dose:  --  Take 100 mg by mouth at bedtime. Yes Yes   JARDIANCE 25 mg tabletLast dose:  --  1 TABLET BY MOUTH ONCE DAILY IN THE MORNING FOR 30 DAYS Yes Yes   meclizine (ANTIVERT) 12.5 mg tabletLast dose:  --  Take 1 tablet (12.5 mg total) by mouth daily as needed for Dizziness Yes     melatonin 10 mg TabLast dose:  -- Last Medication Note: >> Valetta Fuller   Fri Jul 08, 2018  1:18 PM Entered by Valetta Fuller, CPHT Fri Jul 08, 2018 1318 Take 10 mg by mouth nightly as needed.  Yes Yes   multivitamin tabletLast dose:  --  Take 1 tablet by mouth daily. Yes Yes   olopatadine (PATADAY) 0.2 % DropLast dose:  -- Last Medication Note: >> Ezzard Flax Apr 09, 2020  6:19 PM Entered by Francis Dowse, CPHT Tue Apr 09, 2020 1819 Place 1 drop into both eyes daily Yes     ondansetron (ZOFRAN) 4 MG tabletLast dose:  --  Take 4 mg by mouth every 8 (eight) hours as needed for Nausea. Yes Yes   oxyCODONE (ROXICODONE) 5 mg Immediate Release tabletLast dose:  --  Take 1 tablet (5 mg total) by mouth every 4 (four) hours as needed. Yes     phytonadione, vit K1, (VITAMIN K) 100 mcg tabletLast dose:  --  Take 100 mcg by mouth daily. Yes Yes   polyethylene glycol (MIRALAX) 17 gram packetLast dose:  -- Last Medication Note: >> Ezzard Flax Apr 09, 2020  6:19 PM Entered by Francis Dowse, CPHT Tue Apr 09, 2020 1819 Take 1 packet (17 g total) by mouth daily. Mix in  8 ounces of water, juice, soda, coffee or tea prior to taking. Yes     prasterone, dhea,-calcium carb (DHEA) 10 mg-47 mg calcium TabLast dose:  --  Take by mouth. Yes Yes   pravastatin (PRAVACHOL) 40 MG tabletLast dose:  -- Last Medication Note: >> Valetta Fuller   Fri Jul 08, 2018  1:19 PM Entered by Valetta Fuller, CPHT Fri Jul 08, 2018 1319 Take 40 mg by mouth nightly. Yes Yes   senna (SENOKOT) 8.6 mg tabletLast dose:  -- Last Medication Note: >> Doloris Hall   FIE Jul 16, 2018  6:53 PM Entered by Ellard Artis, Amarylis, CPHT Sat Jul 16, 2018 3329 Take 2 tablets by mouth nightly.. Yes     sitaGLIPtin (JANUVIA) 100 MG tabletLast dose:  -- Last Medication Note: >> Ezzard Flax Apr 09, 2020  6:19 PM Entered by Francis Dowse, CPHT Tue Apr 09, 2020 1819 Take 100 mg by mouth daily.. Yes Yes   sodium chloride (SODIUM CHLORIDE) 0.65 % nasal sprayLast dose:  -- Last Medication Note: >> Michaelyn Barter   Fri Sep 13, 2015  9:40 AM Entered by Michaelyn Barter, CPHT Fri Sep 13, 2015 0940 1 spray by Each Nare route as needed for Congestion..  Yes Yes   venlafaxine (EFFEXOR XR) 150 mg XR 24 hr extended release capsuleLast dose:  -- Last Medication Note: >> Ezzard Flax Apr 09, 2020  6:19 PM Entered by Francis Dowse, CPHT Tue Apr 09, 2020 1819 Take 150 mg by mouth daily.  Yes Yes   warfarin (COUMADIN) 5 mg tabletLast dose:  -- Last Medication Note: >> Francis Dowse   Fri May 03, 2020  2:56 PMMedHxTech(CF): Per patient and epic notes medication is taken daily.Entered by Francis Dowse, CPHT Caleen Essex May 03, 2020 1456 Take 5 mg by mouth Daily @1800 . Yes Yes   Prior to admission medications last reviewed by Francis Dowse, CPHT on Fri May 03, 2020 1456 Thank Tia Masker, PharmD1/8/20227:43 PMPhone: MHB

## 2020-05-05 NOTE — Plan of Care
Plan of Care Overview/ Patient Status    0700-0930Neuro:  Alert and oriented x4. Calm and cooperative. Independent OOB.C/V: VSS. Resp: Sating appropriately on room air. NGT in right nostril taped at 55. To low continuous wall suction. 200cc output. NPO.GI/ GU: Abdomen Tender. Pain to palpitation. Old midline abdominal scar from ventral hernia repair 3 weeks ago. +bs  +flatus. Small BM this AM. Voids spontaneously in bathroom. CYO.  Pain:Pain managed with IV Tylenol.  Safety measures maintained. Call bell within reach. Will continue to monitor.  Problem: Adult Inpatient Plan of CareGoal: Plan of Care ReviewOutcome: Interventions implemented as appropriateGoal: Patient-Specific Goal (Individualized)Outcome: Interventions implemented as appropriateGoal: Absence of Hospital-Acquired Illness or InjuryOutcome: Interventions implemented as appropriateGoal: Optimal Comfort and WellbeingOutcome: Interventions implemented as appropriateGoal: Readiness for Transition of CareOutcome: Interventions implemented as appropriate

## 2020-05-05 NOTE — Progress Notes
[facility]	 [facility]	Surgery Progress NoteAttending Provider: Virl Diamond., MDSynopsis:69 y.o. female, with a h/o DM, phospholipid-multiple abdominal surgeries including extended L hemi-colectomy with colostomy/reversal (2015, Dr.Barcewicz), multiple VHRs, multiple SBOs, recently admitted 03/2020 with SBO which resolved now s/p RUQ VHR 04/09/20 (Dr.Zuckerman and Dr.O'Brien), who presents with several hours of abdominal pain nausea, and emesis concerning for recurrent obstruction. Hospital LOS: 2 days Interim Hx: No acute eventsAfebrile, HDSPain well controlled and improvingNGT with 1L recorded since 3pm yesterday afternoon, brown outputWBC down to 7.2, Hgb stable, BMP stable. Passing flatus no BMReview of Allergies/Meds/Hx:I have reviewed the patient's: allergies, current scheduled medications, current infusions and current prn medicationsObjective:Vitals:Last 24 hours: Temp:  [97.9 ?F (36.6 ?C)-98.7 ?F (37.1 ?C)] 98.4 ?F (36.9 ?C)Pulse:  [80-89] 82Resp:  [18-20] 20BP: (101-153)/(48-93) 135/64SpO2:  [94 %-99 %] 96 %I/O's:I/O last 3 completed shifts:In: 1888.5 [I.V.:1888.5]Out: 850 [Other:850]Physical Exam:Gen: NADHEENT: NGT with thin brown outputCV: RRRResp: Normal respiratory effortAbd: Soft, non-distended, minimally tender over right midline, incision healing wellExt: wwpLabs:CBC:Recent Labs Lab 01/07/220020 01/08/220450 01/09/220407 WBC 18.2* 8.4 7.2 HGB 13.4 11.2* 12.5 HCT 42.40 35.60 38.90 PLT 484* 381 378 Metabolic panel:Recent Labs Lab 01/07/220020 01/08/220450 01/09/220407 NA 135* 138 141 K 4.3 4.8 3.4 CL 97* 102 101 CO2 24 28 28  BUN 15 10 7* CREATININE 0.77 0.79 0.73 CALCIUM 9.7 9.1 9.1 MG  --  2.1 2.0 PHOS  --  3.7 3.1 Recent Labs Lab 01/08/221243 01/08/222111 01/08/222356 01/09/220407 GLU 123* 119* 130* 128* Coags:Recent Labs Lab 01/07/220826 PTT 52.9* INR 3.84* LFTs/Panc:Recent Labs Lab 01/07/220020 ALT 16 AST 24 ALKPHOS 148* BILITOT 0.3 BILIDIR <0.2 Nutrition:Recent Labs Lab 01/07/220020 ALBUMIN 4.1 PROT 8.0 Micro:Lab Results Component Value Date  LABBLOO  06/06/2013   No growth after 5 days of incubation.All Blood Culture Bottles with growth will be reported  LABBLOO  05/29/2013   No growth after 5 days of incubation.All Blood Culture Bottles with growth will be reported  LABBLOO  05/29/2013   No growth after 5 days of incubation.All Blood Culture Bottles with growth will be reported  LABURIN  06/16/2016   Less than 10,000 CFU/mL. Clinical significance is unlikely for organism(s) present in quantities of less than 10,000 CFU/mL.  LABURIN Less than 10,000 cfu/mL 06/04/2013  LABURIN No Growth 05/29/2013  LABURIN No Growth 05/27/2013  LOWERRESPIRA 2+ Normal Flora 05/29/2013 	Diagnostics:No results found.ECG/Tele Events: No ECG ordered todayAssessment:Maria [name] is a 70 y.o. year-old female with multiple abdominal surgeries including left hemicolectomy and ostomy reversal and multiple VHR now s/p VHR on 03/2020 presenting with abdominal pain and nausea consistent with SBO is currently passing flatus with no BM with about 1L of NGT output recorded over the past two shifts. Plan:- pain control with oxycodone, tylenol, PRN dilaudid- Diet: NPO- NGT to LCWS, keep today- mIVF- no abx, WBC downtrending- labs- home meds as appropriate- DVT ppx: LMW heparin therapeutic (warfarin for A fib)- GI ppx: none- OOB/ambulate- incentive spirometryDiscussed with attending surgeon Dr. Delos Haring, MD6:33 AM01/09/22Addendum:Patient seen and evaluated and all data reviewed.Management discussed with surgical resident.She has barely passed flatus and still has occasional colicky abdominal pain.She has had no bowel movements.Abdomen is mildly distended.Ngt put out billous drainage last 12 hours.She has persistent partial SBOKeep ngt in place and keep her npo for now.Above plan discussed with Dr Daisey Must

## 2020-05-06 ENCOUNTER — Inpatient Hospital Stay: Admit: 2020-05-06 | Payer: PRIVATE HEALTH INSURANCE

## 2020-05-06 DIAGNOSIS — R109 Unspecified abdominal pain: Secondary | ICD-10-CM

## 2020-05-06 LAB — BASIC METABOLIC PANEL
BKR ANION GAP: 12 (ref 7–17)
BKR BLOOD UREA NITROGEN: 6 mg/dL — ABNORMAL LOW (ref 8–23)
BKR BUN / CREAT RATIO: 8.7 (ref 8.0–23.0)
BKR CALCIUM: 9.1 mg/dL (ref 8.8–10.2)
BKR CHLORIDE: 99 mmol/L (ref 98–107)
BKR CO2: 27 mmol/L (ref 20–30)
BKR CREATININE: 0.69 mg/dL (ref 0.40–1.30)
BKR EGFR (AFR AMER): >60 mL/min/{1.73_m2} (ref >60)
BKR EGFR (NON AFRICAN AMERICAN): >60 mL/min/{1.73_m2} (ref >60)
BKR GLUCOSE: 142 mg/dL — ABNORMAL HIGH (ref 70–100)
BKR POTASSIUM: 4.1 mmol/L (ref 3.3–5.3)
BKR SODIUM: 138 mmol/L (ref 136–144)

## 2020-05-06 LAB — CBC WITH AUTO DIFFERENTIAL
BKR WAM ABSOLUTE IMMATURE GRANULOCYTES.: 0.03 x 1000/ÂµL (ref 0.00–0.30)
BKR WAM ABSOLUTE LYMPHOCYTE COUNT.: 1.51 x 1000/ÂµL (ref 0.60–3.70)
BKR WAM ABSOLUTE NRBC (2 DEC): 0 x 1000/ÂµL (ref 0.00–1.00)
BKR WAM ANALYZER ANC: 7.94 x 1000/ÂµL — ABNORMAL HIGH (ref 2.00–7.60)
BKR WAM BASOPHIL ABSOLUTE COUNT.: 0.03 x 1000/ÂµL (ref 0.00–1.00)
BKR WAM BASOPHILS: 0.3 % (ref 0.0–1.4)
BKR WAM EOSINOPHIL ABSOLUTE COUNT.: 0.26 x 1000/ÂµL (ref 0.00–1.00)
BKR WAM EOSINOPHILS: 2.5 % (ref 0.0–5.0)
BKR WAM HEMATOCRIT (2 DEC): 37.6 % (ref 35.00–45.00)
BKR WAM HEMOGLOBIN: 11.8 g/dL (ref 11.7–15.5)
BKR WAM IMMATURE GRANULOCYTES: 0.3 % (ref 0.0–1.0)
BKR WAM LYMPHOCYTES: 14.4 % — ABNORMAL LOW (ref 17.0–50.0)
BKR WAM MCH (PG): 26.5 pg — ABNORMAL LOW (ref 27.0–33.0)
BKR WAM MCHC: 31.4 g/dL (ref 31.0–36.0)
BKR WAM MCV: 84.5 fL (ref 80.0–100.0)
BKR WAM MONOCYTE ABSOLUTE COUNT.: 0.71 x 1000/ÂµL (ref 0.00–1.00)
BKR WAM MONOCYTES: 6.8 % (ref 4.0–12.0)
BKR WAM MPV: 11.1 fL (ref 8.0–12.0)
BKR WAM NEUTROPHILS: 75.7 % — ABNORMAL HIGH (ref 39.0–72.0)
BKR WAM NUCLEATED RED BLOOD CELLS: 0 % (ref 0.0–1.0)
BKR WAM PLATELETS: 388 x1000/ÂµL (ref 150–420)
BKR WAM RDW-CV: 15 % (ref 11.0–15.0)
BKR WAM RED BLOOD CELL COUNT.: 4.45 M/ÂµL (ref 4.00–6.00)
BKR WAM WHITE BLOOD CELL COUNT: 10.5 x1000/ÂµL (ref 4.0–11.0)

## 2020-05-06 LAB — MAGNESIUM: BKR MAGNESIUM: 1.8 mg/dL (ref 1.7–2.4)

## 2020-05-06 LAB — PHOSPHORUS     (BH GH L LMW YH): BKR PHOSPHORUS: 3.2 mg/dL (ref 2.2–4.5)

## 2020-05-06 MED ORDER — MINERAL OIL ORAL
Freq: Once | NASOGASTRIC | Status: CP
Start: 2020-05-06 — End: ?
  Administered 2020-05-06: 23:00:00 30.000 mL via NASOGASTRIC

## 2020-05-06 NOTE — Plan of Care
Plan of Care Overview/ Patient Status    Assumed care of pt 1000-1900. Pt is A&Ox4, IND OOB, MAE 5/5, VSS on RA. Pt is NPO with NGT taped at 55 to LCWS, IVF maintained on shift, +BS, +Flatus, abd tender, given PRN dilaudid with benadryl with + effect. LBM 1/9. Skin is intact. Medications given per MAR. Safety maintained. See f/s for more info.

## 2020-05-06 NOTE — Progress Notes
General Surgery Progress NoteAttending Provider: Virl Diamond., MDSubjective:NAE overnightPassing flatus - small BM in AMAbdominal pain improved from yesterday Some continued waves of pain/colicOverall HDS/AFObjective:Temp:  [98 ?F (36.7 ?C)-98.8 ?F (37.1 ?C)] 98.3 ?F (36.8 ?C)Pulse:  [79-108] 81Resp:  [14-20] 14BP: (128-168)/(62-80) 150/76SpO2:  [94 %-100 %] 95 %Device (Oxygen Therapy): room airI/O last 3 completed shifts:In: 2386.6 [I.V.:2196.7; IV Piggyback:189.9]Out: 700 [Other:700]Physical ExamGEN: alert and orientedCV: regular rate PULM: normal inspiratory effortGI: soft, mildly tender at upper portion of incision, nondistendedEXT/PV: warm and well perfusedLabsRecent Labs Lab 01/08/220450 01/09/220407 01/09/222335 WBC 8.4 7.2 10.5 HGB 11.2* 12.5 11.8 HCT 35.60 38.90 37.60 PLT 381 378 388 Recent Labs Lab 01/08/220450 01/08/220508 01/09/220407 01/09/220740 01/09/221752 01/09/222335 01/10/220516 NA 138  --  141  --   --  138  --  K 4.8  --  3.4  --   --  4.1  --  CL 102  --  101  --   --  99  --  CO2 28  --  28  --   --  27  --  BUN 10  --  7*  --   --  6*  --  CREATININE 0.79  --  0.73  --   --  0.69  --  GLU 142*   < > 128*   < > 139* 142* 143* CALCIUM 9.1  --  9.1  --   --  9.1  --  MG 2.1  --  2.0  --   --  1.8  --  PHOS 3.7  --  3.1  --   --  3.2  --   < > = values in this interval not displayed. Assessment/Plan:69 y.o. female with a h/o multiple abdominal surgeries (most recently 04/09/20 for [facility]), prior DVT/lupus anticoagulant (on warfarin), admitted 1/7 with a partial SBO. The patient has continued to pass flatus and abdominal pain is improved. Will plan for clamp trials today.PRN pain/nausea controlClamp NGT now - will check residuals at 12PM and 4PM (two 4-hr clamp trials)NPO/IVFLovenoxOOB/AmbulateEncourage ISPlease measure/record urine outputNathan Maassel, MD1/01/2021, 7:26 AMPatient reports ongoing but less pain  Passing small amount of gas yesterday  No BM  afeb vssabd soft and non tenderWbc nlWill do clamp trials todayResuscitationOOBPain control

## 2020-05-06 NOTE — Plan of Care
Plan of Care Overview/ Patient Status    Pt AOx4. VSS on RA. SBP goal <160 maintained. VS. NGT taped to 55cm to LCWS, see flowsheets for details about output.no nausea. No vomiting. Pt npo. No bm this shift. (+) flatus. Tenderness on palpation in all quadrants.  IVF continued. pt iv infiltrated at the beginning of shift. SWAT came to put IV since pt is a hard stick. Prn dilaudid given for pain with positive effect. Prn benadryl given  for itchiness with positive effect. Pt independent. Comfort and safety maintained.?

## 2020-05-07 LAB — BASIC METABOLIC PANEL
BKR ANION GAP: 13 (ref 7–17)
BKR BLOOD UREA NITROGEN: 4 mg/dL — ABNORMAL LOW (ref 8–23)
BKR BUN / CREAT RATIO: 6.5 — ABNORMAL LOW (ref 8.0–23.0)
BKR CALCIUM: 9 mg/dL (ref 8.8–10.2)
BKR CHLORIDE: 99 mmol/L (ref 98–107)
BKR CO2: 24 mmol/L (ref 20–30)
BKR CREATININE: 0.62 mg/dL (ref 0.40–1.30)
BKR EGFR (AFR AMER): >60 mL/min/{1.73_m2} (ref >60)
BKR EGFR (NON AFRICAN AMERICAN): >60 mL/min/{1.73_m2} (ref >60)
BKR GLUCOSE: 220 mg/dL — ABNORMAL HIGH (ref 70–100)
BKR POTASSIUM: 3.9 mmol/L (ref 3.3–5.3)
BKR SODIUM: 136 mmol/L (ref 136–144)

## 2020-05-07 LAB — CBC WITH AUTO DIFFERENTIAL
BKR WAM ABSOLUTE IMMATURE GRANULOCYTES.: 0.04 x 1000/ÂµL (ref 0.00–0.30)
BKR WAM ABSOLUTE LYMPHOCYTE COUNT.: 1.09 x 1000/ÂµL (ref 0.60–3.70)
BKR WAM ABSOLUTE NRBC (2 DEC): 0 x 1000/ÂµL (ref 0.00–1.00)
BKR WAM ANALYZER ANC: 6.05 x 1000/ÂµL (ref 2.00–7.60)
BKR WAM BASOPHIL ABSOLUTE COUNT.: 0.03 x 1000/ÂµL (ref 0.00–1.00)
BKR WAM BASOPHILS: 0.4 % (ref 0.0–1.4)
BKR WAM EOSINOPHIL ABSOLUTE COUNT.: 0.26 x 1000/ÂµL (ref 0.00–1.00)
BKR WAM EOSINOPHILS: 3.3 % (ref 0.0–5.0)
BKR WAM HEMATOCRIT (2 DEC): 40.5 % (ref 35.00–45.00)
BKR WAM HEMOGLOBIN: 12.8 g/dL (ref 11.7–15.5)
BKR WAM IMMATURE GRANULOCYTES: 0.5 % (ref 0.0–1.0)
BKR WAM LYMPHOCYTES: 13.7 % — ABNORMAL LOW (ref 17.0–50.0)
BKR WAM MCH (PG): 26.7 pg — ABNORMAL LOW (ref 27.0–33.0)
BKR WAM MCHC: 31.6 g/dL (ref 31.0–36.0)
BKR WAM MCV: 84.4 fL (ref 80.0–100.0)
BKR WAM MONOCYTE ABSOLUTE COUNT.: 0.46 x 1000/ÂµL (ref 0.00–1.00)
BKR WAM MONOCYTES: 5.8 % (ref 4.0–12.0)
BKR WAM MPV: 11.5 fL (ref 8.0–12.0)
BKR WAM NEUTROPHILS: 76.3 % — ABNORMAL HIGH (ref 39.0–72.0)
BKR WAM NUCLEATED RED BLOOD CELLS: 0 % (ref 0.0–1.0)
BKR WAM PLATELETS: 382 x1000/ÂµL (ref 150–420)
BKR WAM RDW-CV: 15.1 % — ABNORMAL HIGH (ref 11.0–15.0)
BKR WAM RED BLOOD CELL COUNT.: 4.8 M/ÂµL (ref 4.00–6.00)
BKR WAM WHITE BLOOD CELL COUNT: 7.9 x1000/ÂµL (ref 4.0–11.0)

## 2020-05-07 LAB — MAGNESIUM: BKR MAGNESIUM: 1.9 mg/dL (ref 1.7–2.4)

## 2020-05-07 LAB — PHOSPHORUS     (BH GH L LMW YH): BKR PHOSPHORUS: 2.8 mg/dL (ref 2.2–4.5)

## 2020-05-07 LAB — PROTIME AND INR
BKR INR: 1.63 — ABNORMAL HIGH (ref 0.87–1.14)
BKR PROTHROMBIN TIME: 16.9 s — ABNORMAL HIGH (ref 9.6–12.3)

## 2020-05-07 MED ORDER — DEXTROSE 40 % ORAL GEL
40 % | ORAL | Status: DC | PRN
Start: 2020-05-07 — End: 2020-05-08

## 2020-05-07 MED ORDER — GLUCAGON 1 MG/ML IN STERILE WATER
Freq: Once | INTRAMUSCULAR | Status: DC | PRN
Start: 2020-05-07 — End: 2020-05-08

## 2020-05-07 MED ORDER — FRUIT JUICE
ORAL | Status: DC | PRN
Start: 2020-05-07 — End: 2020-05-08

## 2020-05-07 MED ORDER — SKIM MILK
ORAL | Status: DC | PRN
Start: 2020-05-07 — End: 2020-05-08

## 2020-05-07 MED ORDER — DEXTROSE 50 % IN WATER (D50W) INTRAVENOUS SYRINGE
INTRAVENOUS | Status: DC | PRN
Start: 2020-05-07 — End: 2020-05-08

## 2020-05-07 NOTE — Plan of Care
Plan of Care Overview/ Patient Status    1900-0700[name] Hirth, 70 y.o. femaleA/O x 4, Afebrile, VSS, on room air satting well, very low report of pain, PRN dilaudid in [facility] if needed- to be given with benadryl. Pt is on clear liquid diet, tolerating well- good PO intake. No n+v. Pt is passing gas. Voiding spontaneously in bathroom. No BM this shift. Ambulates independently, standby assist with IV pole. D5 1/2 NS running @ 181ml/hr. No remarkable events, call bell in reach, safety maintained, hourly rounding. @0630  rate change= D5 1/2 NS running @ 51ml/hr now.Rainbow Salman L Carrie Schoonmaker, RN1/10/2022BP 137/66  - Pulse 74  - Temp 98.4 ?F (36.9 ?C) (Oral)  - Resp 19  - Ht 4' 10 (1.473 m)  - Wt 67.4 kg  - SpO2 96%  - BMI 31.06 kg/m?

## 2020-05-07 NOTE — Plan of Care
Plan of Care Overview/ Patient Status    [phone] control per regimen. NGT clamped and unclamped with acceptable tolerance. Final residual <5cc. NGT dc'ed 1830. Diet advanced. No events this shift.

## 2020-05-07 NOTE — Progress Notes
Patient is alert and oriented x 4. VSS. Patient currently on a low fiber diet. C/o abd pain 4/10 and nausea. Medicated as requested with effective results. Fluids infusing. Patient is independent with ADL's, up ad lib with steady gait. No BM this shift. Safety maintained, call light within reach.

## 2020-05-07 NOTE — Progress Notes
General Surgery Progress NoteAttending Provider: Virl Diamond., MDSubjective:AXR yesterday with contrast in colonNGT clamp trials with minimal residualsNGT removed yesterdayTolerated liquids last night without nausea/emesisSlept through nightMinimal painPassing lots of flatus, no BMObjective:Temp:  [97.8 ?F (36.6 ?C)-98.4 ?F (36.9 ?C)] 98 ?F (36.7 ?C)Pulse:  [70-86] 77Resp:  [17-19] 17BP: (113-161)/(59-81) 135/81SpO2:  [96 %-100 %] 97 %Device (Oxygen Therapy): room airI/O last 3 completed shifts:In: 1609.5 [P.O.:120; I.V.:1489.5]Out: 705 [Urine:500; Other:205]Physical ExamGEN: alert and orientedCV: regular rate PULM: normal inspiratory effortGI: soft, remains mildly tender at upper portion of incision/RUQ, nondistendedEXT/PV: warm and well perfusedLabsRecent Labs Lab 01/08/220450 01/09/220407 01/09/222335 WBC 8.4 7.2 10.5 HGB 11.2* 12.5 11.8 HCT 35.60 38.90 37.60 PLT 381 378 388 Recent Labs Lab 01/08/220450 01/08/220508 01/09/220407 01/09/220740 01/09/222335 01/10/220516 01/10/221607 01/10/222345 01/11/220549 NA 138  --  141  --  138  --   --   --   --  K 4.8  --  3.4  --  4.1  --   --   --   --  CL 102  --  101  --  99  --   --   --   --  CO2 28  --  28  --  27  --   --   --   --  BUN 10  --  7*  --  6*  --   --   --   --  CREATININE 0.79  --  0.73  --  0.69  --   --   --   --  GLU 142*   < > 128*   < > 142*   < > 123* 126* 140* CALCIUM 9.1  --  9.1  --  9.1  --   --   --   --  MG 2.1  --  2.0  --  1.8  --   --   --   --  PHOS 3.7  --  3.1  --  3.2  --   --   --   --   < > = values in this interval not displayed. Assessment/Plan:69 y.o. female with a h/o multiple abdominal surgeries (most recently 04/09/20 for [facility]), prior DVT/lupus anticoagulant (on warfarin), admitted 1/7 with a partial SBO. The patient continues to progress - continued flatus, NGT now removed, tolerated some clears. Will see how the patient does with clears in AM and consider advancement of diet pending attending assessmentPRN pain/nausea controlDiet Clear Liquid -- advance to LRD1/2 IVF - can HLIVF if good PO intake this afternoonContinue Lovenox - will touch base with pharmacist who manages AC/warfarin outpatient regarding regimen outpatient if continuing to tolerate PO - may restart warfarin tonight. OOB/AmbulateEncourage ISF/u AM labsDispo - pending tolerating diet. Possible discharge this afternoon vs tomorrow if doing well. Janeece Riggers, MD1/02/2021, 6:30 AM

## 2020-05-08 ENCOUNTER — Encounter: Admit: 2020-05-08 | Payer: PRIVATE HEALTH INSURANCE | Attending: Pharmacotherapy | Primary: Internal Medicine

## 2020-05-08 ENCOUNTER — Telehealth: Admit: 2020-05-08 | Payer: PRIVATE HEALTH INSURANCE | Attending: Medical Oncology | Primary: Internal Medicine

## 2020-05-08 ENCOUNTER — Ambulatory Visit: Admit: 2020-05-08 | Payer: PRIVATE HEALTH INSURANCE | Attending: Pharmacotherapy | Primary: Internal Medicine

## 2020-05-08 DIAGNOSIS — Z7984 Long term (current) use of oral hypoglycemic drugs: Secondary | ICD-10-CM

## 2020-05-08 DIAGNOSIS — D6862 Lupus anticoagulant syndrome: Secondary | ICD-10-CM

## 2020-05-08 DIAGNOSIS — Z885 Allergy status to narcotic agent status: Secondary | ICD-10-CM

## 2020-05-08 DIAGNOSIS — K559 Vascular disorder of intestine, unspecified: Secondary | ICD-10-CM

## 2020-05-08 DIAGNOSIS — E119 Type 2 diabetes mellitus without complications: Secondary | ICD-10-CM

## 2020-05-08 DIAGNOSIS — Z79899 Other long term (current) drug therapy: Secondary | ICD-10-CM

## 2020-05-08 DIAGNOSIS — R42 Dizziness and giddiness: Secondary | ICD-10-CM

## 2020-05-08 DIAGNOSIS — H1013 Acute atopic conjunctivitis, bilateral: Secondary | ICD-10-CM

## 2020-05-08 DIAGNOSIS — Z7901 Long term (current) use of anticoagulants: Secondary | ICD-10-CM

## 2020-05-08 DIAGNOSIS — D6861 Antiphospholipid syndrome: Secondary | ICD-10-CM

## 2020-05-08 DIAGNOSIS — Z20822 Contact with and (suspected) exposure to covid-19: Secondary | ICD-10-CM

## 2020-05-08 DIAGNOSIS — E669 Obesity, unspecified: Secondary | ICD-10-CM

## 2020-05-08 DIAGNOSIS — K56600 Partial intestinal obstruction, unspecified as to cause: Secondary | ICD-10-CM

## 2020-05-08 DIAGNOSIS — K5909 Other constipation: Secondary | ICD-10-CM

## 2020-05-08 DIAGNOSIS — E7800 Pure hypercholesterolemia, unspecified: Secondary | ICD-10-CM

## 2020-05-08 DIAGNOSIS — K436 Other and unspecified ventral hernia with obstruction, without gangrene: Secondary | ICD-10-CM

## 2020-05-08 DIAGNOSIS — K55039 Acute (reversible) ischemia of large intestine, extent unspecified: Secondary | ICD-10-CM

## 2020-05-08 DIAGNOSIS — Z6831 Body mass index (BMI) 31.0-31.9, adult: Secondary | ICD-10-CM

## 2020-05-08 DIAGNOSIS — Z881 Allergy status to other antibiotic agents status: Secondary | ICD-10-CM

## 2020-05-08 DIAGNOSIS — Z91040 Latex allergy status: Secondary | ICD-10-CM

## 2020-05-08 DIAGNOSIS — F32A Depression, unspecified: Secondary | ICD-10-CM

## 2020-05-08 DIAGNOSIS — L719 Rosacea, unspecified: Secondary | ICD-10-CM

## 2020-05-08 LAB — BASIC METABOLIC PANEL
BKR ANION GAP: 13 (ref 7–17)
BKR BLOOD UREA NITROGEN: 8 mg/dL (ref 8–23)
BKR BUN / CREAT RATIO: 12.3 (ref 8.0–23.0)
BKR CALCIUM: 9.2 mg/dL (ref 8.8–10.2)
BKR CHLORIDE: 102 mmol/L (ref 98–107)
BKR CO2: 22 mmol/L (ref 20–30)
BKR CREATININE: 0.65 mg/dL (ref 0.40–1.30)
BKR EGFR (AFR AMER): >60 mL/min/{1.73_m2} (ref >60)
BKR EGFR (NON AFRICAN AMERICAN): >60 mL/min/{1.73_m2} (ref >60)
BKR GLUCOSE: 106 mg/dL — ABNORMAL HIGH (ref 70–100)
BKR POTASSIUM: 3.9 mmol/L (ref 3.3–5.3)
BKR SODIUM: 137 mmol/L (ref 136–144)

## 2020-05-08 LAB — CBC WITH AUTO DIFFERENTIAL
BKR WAM ABSOLUTE IMMATURE GRANULOCYTES.: 0.02 x 1000/ÂµL (ref 0.00–0.30)
BKR WAM ABSOLUTE LYMPHOCYTE COUNT.: 1.72 x 1000/ÂµL (ref 0.60–3.70)
BKR WAM ABSOLUTE NRBC (2 DEC): 0 x 1000/ÂµL (ref 0.00–1.00)
BKR WAM ANALYZER ANC: 5 x 1000/ÂµL (ref 2.00–7.60)
BKR WAM BASOPHIL ABSOLUTE COUNT.: 0.04 x 1000/ÂµL (ref 0.00–1.00)
BKR WAM BASOPHILS: 0.5 % (ref 0.0–1.4)
BKR WAM EOSINOPHIL ABSOLUTE COUNT.: 0.27 x 1000/ÂµL (ref 0.00–1.00)
BKR WAM EOSINOPHILS: 3.5 % (ref 0.0–5.0)
BKR WAM HEMATOCRIT (2 DEC): 37.2 % (ref 35.00–45.00)
BKR WAM HEMOGLOBIN: 11.6 g/dL — ABNORMAL LOW (ref 11.7–15.5)
BKR WAM IMMATURE GRANULOCYTES: 0.3 % (ref 0.0–1.0)
BKR WAM LYMPHOCYTES: 22.6 % (ref 17.0–50.0)
BKR WAM MCH (PG): 26.4 pg — ABNORMAL LOW (ref 27.0–33.0)
BKR WAM MCHC: 31.2 g/dL (ref 31.0–36.0)
BKR WAM MCV: 84.7 fL (ref 80.0–100.0)
BKR WAM MONOCYTE ABSOLUTE COUNT.: 0.57 x 1000/ÂµL (ref 0.00–1.00)
BKR WAM MONOCYTES: 7.5 % (ref 4.0–12.0)
BKR WAM MPV: 11.8 fL (ref 8.0–12.0)
BKR WAM NEUTROPHILS: 65.6 % (ref 39.0–72.0)
BKR WAM NUCLEATED RED BLOOD CELLS: 0 % (ref 0.0–1.0)
BKR WAM PLATELETS: 294 x1000/ÂµL (ref 150–420)
BKR WAM RDW-CV: 15.2 % — ABNORMAL HIGH (ref 11.0–15.0)
BKR WAM RED BLOOD CELL COUNT.: 4.39 M/ÂµL (ref 4.00–6.00)
BKR WAM WHITE BLOOD CELL COUNT: 7.6 x1000/ÂµL (ref 4.0–11.0)

## 2020-05-08 LAB — PHOSPHORUS     (BH GH L LMW YH): BKR PHOSPHORUS: 3.3 mg/dL (ref 2.2–4.5)

## 2020-05-08 LAB — MAGNESIUM: BKR MAGNESIUM: 1.9 mg/dL (ref 1.7–2.4)

## 2020-05-08 MED ORDER — ENOXAPARIN 60 MG/0.6 ML SUBCUTANEOUS SYRINGE
600.6 mg/0.6 mL | Freq: Two times a day (BID) | SUBCUTANEOUS | Status: AC
Start: 2020-05-08 — End: 2020-05-17

## 2020-05-08 MED ORDER — ENOXAPARIN 60 MG/0.6 ML SUBCUTANEOUS SYRINGE
60 mg/0.6 mL | Freq: Two times a day (BID) | SUBCUTANEOUS | Status: AC
Start: 2020-05-08 — End: 2020-05-08

## 2020-05-08 NOTE — Other
Maria [name] was discharged via Private Car accompanied by Spouse.  Acknowledged understanding of discharge instructionsand recommended follow up care as per the after visit summary.  Written discharge instructions provided. Denies any further questions. Vital signs    Vitals:  05/07/20 2003 05/08/20 0025 05/08/20 0556 05/08/20 0829 BP: (!) 150/66 131/71 118/73 (!) 149/69 Pulse: 80 71 64 73 Resp: 18 17 18 18  Temp: 97.6 ?F (36.4 ?C) 97.6 ?F (36.4 ?C) 97.6 ?F (36.4 ?C) 97.6 ?F (36.4 ?C) TempSrc: Oral Oral Oral Oral SpO2: 100% 99% 95% 100% Weight:     Height:     Assumed care of pt 0700-1000. Pt is A&Ox4, IND OOB, MAE 5/5, VSS on RA. Did not c/o pain on shift. Tolerating low fiber diet, -n/v, +BS, +Flatus. LBM 1/11. Skin is intact. Medications given per MAR. Safety maintained. See f/s for more info.

## 2020-05-08 NOTE — Plan of Care
Plan of Care Overview/ Patient Status    1500-1900- A&OX4,MAE 5/5,VSS on RA. Lung sounds clear. Abdomen soft with +BS, +flatus +BM this shift. Voiding up to toilet.Patient c/o slight nausea after dinner but no abdominal pain.Call bell in reach.

## 2020-05-08 NOTE — Progress Notes
24h Events:Yesterday afternoon, some nausea with LRD initially improved significantly at dinner; now tolerating wellMinimal painPassing gas, having BMS:She is having mild  abdominal pain.She is having no nausea or vomiting. She is tolerating a diet.She is passing gas and is having bowel movements.O:Temp:  [97.6 ?F (36.4 ?C)-97.9 ?F (36.6 ?C)] 97.6 ?F (36.4 ?C)Pulse:  [64-88] 64Resp:  [17-18] 18BP: (118-150)/(62-74) 118/73SpO2:  [95 %-100 %] 95 %Abdomen: On inspection is not distended, is not tympanitic, is not appropriately tender.I/O last 3 completed shifts:In: 360 [P.O.:360]Out: - A:69 y.o. female with a h/o multiple abdominal surgeries (most recently 04/09/20 for [facility]), prior DVT/lupus anticoagulant (on warfarin), admitted 1/7 with a partial SBO. The patient continues to progress - now tolerating LRD. Continues to pass flatus and now having BM.  P:Diet: LRD DVT PPx: lovenox, will confirm with pt home heme plan for warfarin vs lovenox going home Dispo: home likely todayContact:Hosey Burmester, PAColorectal Floor Pager: [phone]

## 2020-05-08 NOTE — Progress Notes
General Surgery Progress NoteAttending Provider: Virl Diamond., MDSubjective:Tolerating regular dietBM yesterdayPassing flatusPain improvedAmbulating without painObjective:Temp:  [97.6 ?F (36.4 ?C)-97.9 ?F (36.6 ?C)] 97.6 ?F (36.4 ?C)Pulse:  [64-88] 64Resp:  [17-18] 18BP: (118-150)/(62-74) 118/73SpO2:  [95 %-100 %] 95 %Device (Oxygen Therapy): room airI/O last 3 completed shifts:In: 360 [P.O.:360]Out: - Physical ExamGEN: alert and orientedCV: regular rate PULM: normal inspiratory effortGI: soft, nontender, nondistended, well-healed mid-line incisionEXT/PV: warm and well perfusedLabsRecent Labs Lab 01/09/222335 01/11/221109 01/12/220551 WBC 10.5 7.9 7.6 HGB 11.8 12.8 11.6* HCT 37.60 40.50 37.20 PLT 388 382 294 Recent Labs Lab 01/09/220407 01/09/220740 01/09/222335 01/10/220516 01/11/221109 01/11/221118 01/11/221656 01/11/222039 01/12/220028 NA 141  --  138  --  136  --   --   --   --  K 3.4  --  4.1  --  3.9  --   --   --   --  CL 101  --  99  --  99  --   --   --   --  CO2 28  --  27  --  24  --   --   --   --  BUN 7*  --  6*  --  4*  --   --   --   --  CREATININE 0.73  --  0.69  --  0.62  --   --   --   --  GLU 128*   < > 142*   < > 220*   < > 89 127* 122* CALCIUM 9.1  --  9.1  --  9.0  --   --   --   --  MG 2.0  --  1.8  --  1.9  --   --   --   --  PHOS 3.1  --  3.2  --  2.8  --   --   --   --   < > = values in this interval not displayed. Assessment/Plan:69 y.o. 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 Her obstruction has now resolved in addition to her painPRN pain/nausea controlDiet Low FiberWill likely discharge on lovenox - will discuss with hematology teamOOB/AmbulateEncourage ISF/u AM labsDispo - home todayNathan Ilena Dieckman, MD1/03/2021, 6:30 AMADDENDUM:Discussed AC plan with Dr.Russo - patient's hematologistWill plan for continuing warfarin 5mg  daily today as well as lovenox 60 mg BID (patient has lovenox at home)Their office will setup INR visit for tomorrow or [facility] [place], MD1/03/2021, 7:40 AM

## 2020-05-08 NOTE — Plan of Care
Plan of Care Overview/ Patient Status    [name], 70 y.o. femaleA/O x 4, Afebrile, VSS, on room air satting well, report of pain around 12:30am, medicated with dilaudid + benadryl with + effect. Low fiber diet, adequate PO intake. Got a little nauseous earlier in the day with heavier foods. Tolerating clears well. Slight nausea with mashed potatoes. Given PRN zofran with +effect. +bs 4quads. Passing gas. Voiding spontaneously in bathroom, no BM this shift. Ambulation: ambulates independently. No remarkable events, call bell in reach, safety maintained, hourly rounding. Lucia Harm L Rafael Salway, RN1/12/2022BP 131/71  - Pulse 71  - Temp 97.6 ?F (36.4 ?C) (Oral)  - Resp 17  - Ht 4' 10 (1.473 m)  - Wt 67.4 kg  - SpO2 99%  - BMI 31.06 kg/m? Problem: Adult Inpatient Plan of CareGoal: Plan of Care ReviewOutcome: Interventions implemented as appropriateGoal: Patient-Specific Goal (Individualized)Outcome: Interventions implemented as appropriateGoal: Absence of Hospital-Acquired Illness or InjuryOutcome: Interventions implemented as appropriateGoal: Optimal Comfort and WellbeingOutcome: Interventions implemented as appropriateGoal: Readiness for Transition of CareOutcome: Interventions implemented as appropriate Problem: Impaired Wound HealingGoal: Optimal Wound HealingOutcome: Interventions implemented as appropriate Problem: Fall Injury RiskGoal: Absence of Fall and Fall-Related InjuryOutcome: Interventions implemented as appropriate

## 2020-05-08 NOTE — Discharge Instructions
You were seen in the hospital for a bowel obstruction. This was managed by putting a tube into your stomach to keep fluid from building up.  We were able to remove the tube and your intestines began to work again. You should continue to eat a low fiber diet. Take it slow and if you feel nauseated stop eating and rest.You should continue to walk around as much as possible. This will keep your bowels moving.If you have severe vomiting, severe abdominal pain (especially if you stop passing gas or moving your bowels) go directly to the ER.Start taking your warfarin again. You should should take the Lovenox 60 mg twice a day until your hematologist tells you that you can stop taking it.  Your hematologists office will set up another INR level test for you within the next 2 days.

## 2020-05-08 NOTE — Telephone Encounter
Received call from patient, she is being discharged from the hospital today and has a question about her lovenox shot when she returns home. Please call patient to discuss  [phone]

## 2020-05-08 NOTE — Plan of Care
Problem: Adult Inpatient Plan of CareGoal: Readiness for Transition of CareOutcome: Outcome(s) achieved Plan of Care Overview/ Patient Status    Patient is medically ready for discharge per provider and  in agreement.Patient refused HCSFamily to transportCase Management Plan    Most Recent Value Discharge Planning Mode of Transportation  Private car  (add comment for special considerations) CM D/C Readiness PASRR completed and approved N/A Authorization number obtained, if required N/A Is there a 3 day INPATIENT Qualifying stay for Medicare Patients? Yes Medicare IM- signed, dated, timed and scanned, if required Yes DME Authorized/Delivered N/A No needs identified/ follow up with PCP/MD N/A Post acute care services secured W10 complete N/A Pri Completed and Accepted  N/A Is the destination address correct on the W10 N/A Finalized Plan Expected Discharge Date 05/08/20 Discharge Disposition Home or Self Care  Naomie Dean, RN [facility] ManagerYNHH Care Management DepartmentP: [phone]: [phone]

## 2020-05-08 NOTE — Discharge Summary
[facility]	Colon and Rectal Surgery Discharge SummaryPatient Data:  Patient Name: Maria [name] Age: 70 y.o. DOB: 1950-08-27	 MRN: [id number]	 Admit date: 1/7/2022Discharge date: 1/12/2022Discharge Attending Physician: Virl Diamond., MD  PCP: Cena Benton, MDPrincipal Diagnosis: SBO (small bowel obstruction) (HC Code) (HC CODE)Other Diagnosis: Anti-cardiolipin antibody syndromeDischarged Condition: goodDisposition: Home Allergies Allergies Allergen Reactions ? Morphine Hives and Itching   Pt tolerated morphine during admission 08/08/16-08/09/16.  Pt premedicated with diphenhydramine ? Biaxin  [Clarithromycin]  ? Latex, Natural Rubber Rash  Hospital Course: Maria [name] is a 70 y.o. 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  She presented to the ED on 05/03/2020 for evaluation of nausea and abd pain x1day. She had a [place] scan that showed pSBO and an NGT was placed. She had some improvement in symptoms after NGT placement and she had + flatus. She had an NGT clamp trial on 05/06/2020 and was able to have tube removed and start clear liquid diet.On 05/07/2020 she was started on low fiber carb consistent diet and tolerated well. She was doing well and ready for discharge on 05/08/2020. She will continue on lovenox 60mg  BID and restart her home dosing of coumadin, will have INR check as scheduled, and will follow-up with her hematologist regarding further anticoagulation plans. Pertinent Procedures: nonePertinent lab findings and test results: Discharge vitals: Blood pressure 118/73, pulse 64, temperature 97.6 ?F (36.4 ?C), temperature source Oral, resp. rate 18, height 4' 10 (1.473 m), weight 67.4 kg, SpO2 95 %, not currently breastfeeding.Discharge Physical Exam:GEN: alert and orientedCV: regular rate PULM: normal inspiratory effortGI: soft, remains mildly tender at upper portion of incision/RUQ, nondistendedEXT/PV: warm and well perfused?Pertinent Findings of Physical Exam: As noted aboveCognitive Status at Discharge: As noted above Pending Labs and Tests: Pending Lab Results   Order Current Status  Basic metabolic panel Collected (05/07/20 0550)  CBC auto differential Collected (05/07/20 0550)  Magnesium Collected (05/07/20 0550)  Phosphorus     (BH GH L LMW YH) Collected (05/07/20 0550)  ISSUES TO BE ADDRESSED POST DISCHARGE: 1. INR check and follow up surgery as needed Discharge Medications: Current Discharge Medication List  CONTINUE these medications which have NOT CHANGED  Details acetaminophen (TYLENOL) 500 mg tablet Take 1 tablet (500 mg total) by mouth every 4 (four) hours as needed.Qty: 30 tablet, Refills: 11  ascorbic acid (VITAMIN C ORAL) Take 1 tablet by mouth daily.  ascorbic acid-collagen (COLLAGEN PLUS VITAMIN C) 125-740 mg Cap Take 1 capsule by mouth daily.   biotin 1 mg tablet Take 1 tablet by mouth daily.   cholecalciferol, vitamin D3, (VITAMIN D3) 5,000 unit Tab Take 1 tablet by mouth every other day..  gabapentin (NEURONTIN) 100 mg capsule Take 100 mg by mouth at bedtime.  JARDIANCE 25 mg tablet 1 TABLET BY MOUTH ONCE DAILY IN THE MORNING FOR 30 DAYS  meclizine (ANTIVERT) 12.5 mg tablet Take 1 tablet (12.5 mg total) by mouth daily as needed for DizzinessQty: 30 tablet, Refills: 1  Associated Diagnoses: Vertigo  melatonin 10 mg Tab Take 10 mg by mouth nightly as needed.   multivitamin tablet Take 1 tablet by mouth daily.  olopatadine (PATADAY) 0.2 % Drop Place 1 drop into both eyes dailyQty: 2.5 mL, Refills: 6  Associated Diagnoses: Allergic conjunctivitis of both eyes  ondansetron (ZOFRAN) 4 MG tablet Take 4 mg by mouth every 8 (eight) hours as needed for Nausea.  oxyCODONE (ROXICODONE) 5 mg Immediate Release tablet Take 1 tablet (5 mg total) by mouth every 4 (four) hours as needed.Qty: 8 tablet, Refills: 0  phytonadione, vit K1, (VITAMIN K) 100 mcg tablet Take 100 mcg by mouth daily.  polyethylene glycol (MIRALAX) 17 gram packet Take 1 packet (17 g total) by mouth daily. Mix in 8 ounces of water, juice, soda, coffee or tea prior to taking.Qty: 14 each, Refills: 2  prasterone, dhea,-calcium carb (DHEA) 10 mg-47 mg calcium Tab Take by mouth.  pravastatin (PRAVACHOL) 40 MG tablet Take 40 mg by mouth nightly.  senna (SENOKOT) 8.6 mg tablet Take 2 tablets by mouth nightly.Rob Bunting: 30 tablet, Refills: 11  Associated Diagnoses: SBO (small bowel obstruction) (HC Code) (HC CODE)  sitaGLIPtin (JANUVIA) 100 MG tablet Take 100 mg by mouth daily..  sodium chloride (SODIUM CHLORIDE) 0.65 % nasal spray 1 spray by Each Nare route as needed for Congestion.[name]   venlafaxine (EFFEXOR XR) 150 mg XR 24 hr extended release capsule Take 150 mg by mouth daily.   warfarin (COUMADIN) 5 mg tablet Take 5 mg by mouth Daily @1800 .   Yes the patient is being discharged on Warfarin. These are the most recent dosings and INRs  Recent Warfarin Dosing   Recent Warfarin Dosing Latest Ref Rng & Units 03/31/2020 04/22/2020 05/03/2020 05/07/2020  INR 0.87 - 1.14 1.89(H) 2.23(H) 3.84(H) 1.63(H)  Follow-up Information:O'Brien, Saunders Glance., MD[facility] [employer] [phone] needed [facility] Past Medical History: Diagnosis Date ? Acute ischemic colitis (HC Code) (HC CODE) 2015 ? Depression  ? Diabetes mellitus (HC Code) (HC CODE) ? Hypercholesteremia  ? 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 Rosacea  ? Ventral hernia with bowel obstruction  ? Vertigo   Past Surgical History: Procedure Laterality Date ? ABDOMINAL ADHESION SURGERY  2016 ? BLEPHAROPLASTY Bilateral 05/2016 ? COLOSTOMY  05/27/13 ? COLOSTOMY CLOSURE   ? KNEE LIGAMENT RECONSTRUCTION Right 2004 ? LAPAROSCOPIC NISSEN FUNDOPLICATION    2000 ? ? SINUS SURGERY   ? THYROID BIOPSY   ? TUBAL LIGATION   ? UMBILICAL HERNIA REPAIR   ? VENTRAL HERNIA REPAIR    Social History Family History Social History Tobacco Use ? Smoking status: Never Smoker ? Smokeless tobacco: Never Used Substance Use Topics ? Alcohol use: Yes   Comment: once or twice a year  Family History Problem Relation Age of Onset ? Bladder cancer Mother  ? Colon cancer Paternal Aunt  ? Colon cancer Cousin       died of colona cancer at age 79 ? Breast cancer Cousin       2 cousins with breast cancer ? Colon cancer Father   Electronically Signed:Michael Daryel November, PA-C - [place] Rigg, [place]

## 2020-05-09 ENCOUNTER — Ambulatory Visit
Admit: 2020-05-09 | Payer: PRIVATE HEALTH INSURANCE | Attending: Pharmacist Clinician (PhC)/ Clinical Pharmacy Specialist | Primary: Internal Medicine

## 2020-05-09 ENCOUNTER — Encounter
Admit: 2020-05-09 | Payer: PRIVATE HEALTH INSURANCE | Attending: Pharmacist Clinician (PhC)/ Clinical Pharmacy Specialist | Primary: Internal Medicine

## 2020-05-09 DIAGNOSIS — D6862 Lupus anticoagulant syndrome: Secondary | ICD-10-CM

## 2020-05-09 DIAGNOSIS — K559 Vascular disorder of intestine, unspecified: Secondary | ICD-10-CM

## 2020-05-09 NOTE — Progress Notes
Patient instructed to start Lovenox 100 mg once daily starting today in the mornings, and warfarin 5 mg in the mornings. Her last dose of Lovenox was 60 mg at 0800 on 1/12 prior to discharge. She will retest her INR on 1/15 and I will reach out to her with the result and further instructions. Patient reports losing 15 lbs during her recent hospitalizations due to being on a liquid diet.

## 2020-05-09 NOTE — Telephone Encounter
This encounter was created in error - please disregard.

## 2020-05-11 ENCOUNTER — Ambulatory Visit
Admit: 2020-05-11 | Payer: PRIVATE HEALTH INSURANCE | Attending: Pharmacist Clinician (PhC)/ Clinical Pharmacy Specialist | Primary: Internal Medicine

## 2020-05-11 ENCOUNTER — Inpatient Hospital Stay: Admit: 2020-05-11 | Discharge: 2020-05-11 | Payer: PRIVATE HEALTH INSURANCE | Primary: Internal Medicine

## 2020-05-11 DIAGNOSIS — Z7901 Long term (current) use of anticoagulants: Secondary | ICD-10-CM

## 2020-05-11 DIAGNOSIS — Z5181 Encounter for therapeutic drug level monitoring: Secondary | ICD-10-CM

## 2020-05-11 DIAGNOSIS — D6862 Lupus anticoagulant syndrome: Secondary | ICD-10-CM

## 2020-05-11 DIAGNOSIS — K559 Vascular disorder of intestine, unspecified: Secondary | ICD-10-CM

## 2020-05-11 LAB — PROTIME AND INR
BKR INR: 1.57 — ABNORMAL HIGH (ref 0.87–1.14)
BKR PROTHROMBIN TIME: 16.4 seconds — ABNORMAL HIGH (ref 9.6–12.3)

## 2020-05-11 NOTE — Progress Notes
Pharmacist Anticoagulation Clinic Telephone Visit Follow Maria [name] (Sep 11, 1950) is on chronic anticoagulation for the diagnosis of  Lupus anticoagulant with hypercoagulable state (HC Code) (HC CODE)  (primary encounter diagnosis) Ischemic colitis (HC Code) (HC CODE) with an INR goal of 2.0-3.0.  The patient was contacted regarding lab drawn INR results.  Patient's INR findings and prescribed dose from last visit:Anticoagulation Monitoring Latest Ref Rng & Units 05/08/2020 05/09/2020 05/11/2020 INR 2.0 - 3.0 - - - INR 0.91 - 1.10 - - - INR (External) 0.80 - 1.15 - - - Assoc. INR Date - 05/07/2020 - 05/11/2020 Associated INR - 1.63 - 1.57 Instructions - 5 mg every day 5 mg every day 7.5 mg every Sat; 5 mg all other days Last 7 day dose total - 10 mg 5 mg 10 mg Next INR Date - 05/10/2020 05/11/2020 - The following findings were reported from today's call (no findings if none selected):[]  Missed doses []  Extra doses []  Change in medications []  Change in vitamin k rich food intake (green vegetables, herbals, etc) []  Change in alcohol use []  Change in overall health []  Recent hospitalization / emergency department visit []  Upcoming procedure (including dental procedures) []  Other Comments:       No findings The following side effects were reported from today's call (no side effects if none selected):[]  Bleeding [x]  New bruising - has new bruising on her stomach and her arms []  Warfarin-emergency department visit or hospital admission []  Other Comments:    Assessment/Plan:Patient's INR is 1.57, which is below goal range of 2.0-3.0.  She was instructed to continue Lovenox 100 mg SC daily in the mornings and take 7.5 mg today and 5 mg all other days. as indicated in the maintenance plan below.  Next INR assessment due in 3 days. This information was routed to the covering provider for review. Has #7 Lovenox syringes left at home. Anticoagulation Summary  As of 05/11/2020  INR goal:  2.0-3.0 TTR:  46.2 % (5.9 y) INR used for dosing:  1.57 (05/11/2020) Warfarin maintenance plan:  7.5 mg (5 mg x 1.5) every Sat; 5 mg (5 mg x 1) all other days Weekly warfarin total:  37.5 mg Plan last modified:  [name], PharmD (05/11/2020) Next INR check:   Priority:  High Priority Target end date:  Indefinite  Indications  Lupus anticoagulant with hypercoagulable state (HC Code) (HC CODE) [D68.62]Ischemic colitis (HC Code) (HC CODE) [K55.9]   Anticoagulation Episode Summary   INR check location:    Preferred lab:    Send INR reminders to:  [facility] [place] INR POOL  Comments:    Anticoagulation Care Providers   Provider Role Specialty Phone number  [name], MD Referring Medical Oncology [phone]  [place], [name], APRN Responsible Medical Oncology [phone]  All patient's medications have been reviewed and updated as needed.  Electronically Signed by [name], PharmD, May 11, 2020 [facility]  [address] [place], [place] [address]  [phone]  Fax: [phone]

## 2020-05-15 ENCOUNTER — Encounter: Admit: 2020-05-15 | Payer: PRIVATE HEALTH INSURANCE | Attending: Medical Oncology | Primary: Internal Medicine

## 2020-05-15 ENCOUNTER — Inpatient Hospital Stay: Admit: 2020-05-15 | Discharge: 2020-05-15 | Payer: PRIVATE HEALTH INSURANCE | Primary: Internal Medicine

## 2020-05-15 ENCOUNTER — Ambulatory Visit: Admit: 2020-05-15 | Payer: PRIVATE HEALTH INSURANCE | Attending: Anesthesiology | Primary: Internal Medicine

## 2020-05-15 DIAGNOSIS — D6862 Lupus anticoagulant syndrome: Secondary | ICD-10-CM

## 2020-05-15 DIAGNOSIS — N840 Polyp of corpus uteri: Secondary | ICD-10-CM

## 2020-05-15 DIAGNOSIS — N950 Postmenopausal bleeding: Secondary | ICD-10-CM

## 2020-05-15 DIAGNOSIS — Z5181 Encounter for therapeutic drug level monitoring: Secondary | ICD-10-CM

## 2020-05-15 LAB — PT/INR AND PTT (BH GH L LMW YH)
BKR INR: 1.9 — ABNORMAL HIGH (ref 0.91–1.05)
BKR PARTIAL THROMBOPLASTIN TIME: 41.2 seconds — ABNORMAL HIGH (ref 22.0–30.3)
BKR PROTHROMBIN TIME: 19.7 seconds — ABNORMAL HIGH (ref 10.0–11.6)

## 2020-05-16 ENCOUNTER — Telehealth
Admit: 2020-05-16 | Payer: PRIVATE HEALTH INSURANCE | Attending: Pharmacist Clinician (PhC)/ Clinical Pharmacy Specialist | Primary: Internal Medicine

## 2020-05-16 ENCOUNTER — Ambulatory Visit
Admit: 2020-05-16 | Payer: PRIVATE HEALTH INSURANCE | Attending: Pharmacist Clinician (PhC)/ Clinical Pharmacy Specialist | Primary: Internal Medicine

## 2020-05-16 DIAGNOSIS — D6862 Lupus anticoagulant syndrome: Secondary | ICD-10-CM

## 2020-05-16 DIAGNOSIS — K559 Vascular disorder of intestine, unspecified: Secondary | ICD-10-CM

## 2020-05-16 DIAGNOSIS — Z5181 Encounter for therapeutic drug level monitoring: Secondary | ICD-10-CM

## 2020-05-16 NOTE — Progress Notes
Pharmacist Anticoagulation Clinic Telephone Visit Follow Maria [name] (1950/08/10) is on chronic anticoagulation for the diagnosis of  Lupus anticoagulant with hypercoagulable state (HC Code) (HC CODE)  (primary encounter diagnosis) Ischemic colitis (HC Code) (HC CODE) with an INR goal of 2.0-3.0.  The patient was contacted regarding lab drawn INR results.  Patient's INR findings and prescribed dose from last visit:Anticoagulation Monitoring Latest Ref Rng & Units 05/09/2020 05/11/2020 05/16/2020 INR 2.0 - 3.0 - - - INR 0.91 - 1.10 - - - INR (External) 0.80 - 1.15 - - - Assoc. INR Date - - 05/11/2020 05/15/2020 Associated INR - - 1.57 1.90 Instructions - 5 mg every day 7.5 mg every Sat; 5 mg all other days 7.5 mg every Sat; 5 mg all other days Last 7 day dose total - 5 mg 10 mg 37.5 mg Next INR Date - 05/11/2020 05/14/2020 05/22/2020 The following findings were reported from today's call (no findings if none selected):[]  Missed doses []  Extra doses []  Change in medications []  Change in vitamin k rich food intake (green vegetables, herbals, etc) []  Change in alcohol use []  Change in overall health []  Recent hospitalization / emergency department visit []  Upcoming procedure (including dental procedures) []  Other Comments:       No findings The following side effects were reported from today's call (no side effects if none selected):[]  Bleeding []  New bruising []  Warfarin-emergency department visit or hospital admission []  Other Comments:   No side effects Assessment/Plan:Patient's INR is 1.90, which is below goal range of 2.0-3.0.  Patient continues to bridge with Lovenox 100 mg SC daily.  She was instructed to take warfrin 7.5 mg today and retest INR tomorrow, 1/21 to see if we can stop the Lovenox bridge. This information was routed to the covering provider for review.Anticoagulation Summary  As of 05/16/2020  INR goal:  2.0-3.0 TTR:  46.1 % (5.9 y) INR used for dosing:  1.90 (05/15/2020) Warfarin maintenance plan:  7.5 mg (5 mg x 1.5) every Sat; 5 mg (5 mg x 1) all other days Weekly warfarin total:  37.5 mg Plan last modified:  [name], PharmD (05/11/2020) Next INR check:  05/22/2020 Priority:  High Priority Target end date:  Indefinite  Indications  Lupus anticoagulant with hypercoagulable state (HC Code) (HC CODE) [D68.62]Ischemic colitis (HC Code) (HC CODE) [K55.9]   Anticoagulation Episode Summary   INR check location:    Preferred lab:    Send INR reminders to:  [facility] [place] INR POOL  Comments:    Anticoagulation Care Providers   Provider Role Specialty Phone number  [name], MD Referring Medical Oncology [phone]  [place], [name], APRN Responsible Medical Oncology [phone]  All patient's medications have been reviewed and updated as needed.  Electronically Signed by [name], PharmD, May 16, 2020 [facility]  [address], [place], [place] [address]  Phone:[phone]  Fax: [phone]

## 2020-05-16 NOTE — Telephone Encounter
Pt is requesting a call back to go over INR results. please call [phone]

## 2020-05-17 ENCOUNTER — Ambulatory Visit
Admit: 2020-05-17 | Payer: PRIVATE HEALTH INSURANCE | Attending: Pharmacist Clinician (PhC)/ Clinical Pharmacy Specialist | Primary: Internal Medicine

## 2020-05-17 ENCOUNTER — Inpatient Hospital Stay: Admit: 2020-05-17 | Discharge: 2020-05-17 | Payer: PRIVATE HEALTH INSURANCE | Primary: Internal Medicine

## 2020-05-17 DIAGNOSIS — D6862 Lupus anticoagulant syndrome: Secondary | ICD-10-CM

## 2020-05-17 DIAGNOSIS — K559 Vascular disorder of intestine, unspecified: Secondary | ICD-10-CM

## 2020-05-17 DIAGNOSIS — Z7901 Long term (current) use of anticoagulants: Secondary | ICD-10-CM

## 2020-05-17 DIAGNOSIS — Z5181 Encounter for therapeutic drug level monitoring: Secondary | ICD-10-CM

## 2020-05-17 LAB — PROTIME AND INR
BKR INR: 2.37 — ABNORMAL HIGH (ref 0.87–1.14)
BKR PROTHROMBIN TIME: 23.7 seconds — ABNORMAL HIGH (ref 9.6–12.3)

## 2020-05-17 MED ORDER — ENOXAPARIN 100 MG/ML SUBCUTANEOUS SYRINGE
100 mg/mL | Freq: Every day | SUBCUTANEOUS | 2 refills | Status: SS
Start: 2020-05-17 — End: 2020-10-02

## 2020-05-23 ENCOUNTER — Telehealth
Admit: 2020-05-23 | Payer: PRIVATE HEALTH INSURANCE | Attending: Pharmacist Clinician (PhC)/ Clinical Pharmacy Specialist | Primary: Internal Medicine

## 2020-05-23 NOTE — Telephone Encounter
Received call from patient she wanted to let Nadya know that she can not get her labs drawn this week for her INR, and she will get her labs drawn Monday 1/31.

## 2020-05-31 ENCOUNTER — Telehealth: Admit: 2020-05-31 | Payer: PRIVATE HEALTH INSURANCE | Primary: Internal Medicine

## 2020-05-31 ENCOUNTER — Encounter: Admit: 2020-05-31 | Payer: PRIVATE HEALTH INSURANCE | Primary: Internal Medicine

## 2020-05-31 DIAGNOSIS — R42 Dizziness and giddiness: Secondary | ICD-10-CM

## 2020-05-31 DIAGNOSIS — K55039 Acute (reversible) ischemia of large intestine, extent unspecified: Secondary | ICD-10-CM

## 2020-05-31 DIAGNOSIS — L719 Rosacea, unspecified: Secondary | ICD-10-CM

## 2020-05-31 DIAGNOSIS — D6862 Lupus anticoagulant syndrome: Secondary | ICD-10-CM

## 2020-05-31 DIAGNOSIS — E7800 Pure hypercholesterolemia, unspecified: Secondary | ICD-10-CM

## 2020-05-31 DIAGNOSIS — K436 Other and unspecified ventral hernia with obstruction, without gangrene: Secondary | ICD-10-CM

## 2020-05-31 DIAGNOSIS — F32A Depression: Secondary | ICD-10-CM

## 2020-05-31 DIAGNOSIS — E119 Type 2 diabetes mellitus without complications: Secondary | ICD-10-CM

## 2020-05-31 MED ORDER — BENADRYL ALLERGY ORAL
ORAL | Status: AC | PRN
Start: 2020-05-31 — End: ?

## 2020-05-31 NOTE — Other
AppointmentInstructions discussed with the patient? YesCopy of instructions sent home with the patient? YesAppointment Providers: NP: Tedra Senegal   Attending: Rosana Berger

## 2020-06-04 ENCOUNTER — Telehealth: Admit: 2020-06-04 | Payer: PRIVATE HEALTH INSURANCE | Primary: Internal Medicine

## 2020-06-04 NOTE — Telephone Encounter
PHARMACY NOTE: INR REMINDER MISSED CALL 	I have left a voicemail for Ms. [name] on 06/04/2020 at 2:06 PM with a reminder that patient is due for an INR lab test based on the referral for anticoagulation monitoring. This is the first week of missed calls. I have also notified the ambulatory care pharmacist   as appropriate.Malvin Johns, CPHTAmbulatory Care Pharmacy Technician [facility] Anticoagulation and [facility] Primary CarePharmacy Department2/8/20222:06 PM

## 2020-06-05 ENCOUNTER — Encounter
Admit: 2020-06-05 | Payer: PRIVATE HEALTH INSURANCE | Attending: Pharmacist Clinician (PhC)/ Clinical Pharmacy Specialist | Primary: Internal Medicine

## 2020-06-05 ENCOUNTER — Inpatient Hospital Stay: Admit: 2020-06-05 | Discharge: 2020-06-05 | Payer: PRIVATE HEALTH INSURANCE | Primary: Internal Medicine

## 2020-06-05 ENCOUNTER — Ambulatory Visit
Admit: 2020-06-05 | Payer: PRIVATE HEALTH INSURANCE | Attending: Pharmacist Clinician (PhC)/ Clinical Pharmacy Specialist | Primary: Internal Medicine

## 2020-06-05 DIAGNOSIS — D6862 Lupus anticoagulant syndrome: Secondary | ICD-10-CM

## 2020-06-05 DIAGNOSIS — K559 Vascular disorder of intestine, unspecified: Secondary | ICD-10-CM

## 2020-06-05 DIAGNOSIS — Z7901 Long term (current) use of anticoagulants: Secondary | ICD-10-CM

## 2020-06-05 DIAGNOSIS — Z5181 Encounter for therapeutic drug level monitoring: Secondary | ICD-10-CM

## 2020-06-05 LAB — PROTIME AND INR
BKR INR: 4.38 — ABNORMAL HIGH (ref 0.91–1.05)
BKR PROTHROMBIN TIME: 43 seconds — ABNORMAL HIGH (ref 10.0–11.6)

## 2020-06-06 NOTE — Progress Notes
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  The patient was contacted regarding lab drawn INR results.  Patient's INR findings and prescribed dose from last visit:Anticoagulation Monitoring Latest Ref Rng & Units 05/16/2020 05/17/2020 06/05/2020 INR 2.0 - 3.0 - - - INR 0.91 - 1.10 - - - INR (External) 0.80 - 1.15 - - - Assoc. INR Date - 05/15/2020 05/17/2020 06/05/2020 Associated INR - 1.90 2.37 4.38 Instructions - 1/20: 7.5 mg; Otherwise 7.5 mg every Sat; 5 mg all other days 2/17: Hold; 2/18: Hold; 2/19: Hold; 2/20: Hold; 2/21: Hold; Otherwise 5 mg every day 2/9: Hold; 2/17: Hold; 2/18: Hold; 2/19: Hold; 2/20: Hold; 2/21: Hold; Otherwise 2.5 mg every Thu; 5 mg all other days Last 7 day dose total - 37.5 mg 40 mg 35 mg Next INR Date - 05/17/2020 05/22/2020 - The following findings were reported from today's call (no findings if none selected):[]  Missed doses []  Extra doses [x]  Change in medications []  Change in vitamin k rich food intake (green vegetables, herbals, etc) []  Change in alcohol use [x]  Change in overall health []  Recent hospitalization / emergency department visit []  Upcoming procedure (including dental procedures) []  Other Comments:       Patient was taking Tylenol 2000 mg daily and she was eating less The following side effects were reported from today's call (no side effects if none selected):[]  Bleeding []  New bruising []  Warfarin-emergency department visit or hospital admission []  Other Comments:   No side effects Assessment/Plan:Patient's INR is 4.38, which is above goal range of 2.0-3.0.  Patient was away for 1.5 weeks and was eating less overall during this time, definitely less vit K foods. Reports having headaches every day (this happens to her when she is stressed, just returned from a trip to visit family member who was in an accident), taking Tylenol, 1000 mg twice daily (2000 mg) for the last 4 days. She will be taking less than 2000 mg going forward because her headaches are getting better.  She was instructed to hold her warfarin today and lower the weekly dose by 7% to 2.5 mg on Thu, 5 mg all other day as indicated in the maintenance plan below.  Next INR assessment due 1 week. This information was routed to the covering provider for review. Bridging plan: patient is to come off of warfarin for 5 full days prior to procedure, and bridge with Lovenox 1.5 mg/kg daily x 4 days prior to procedure, last dose 24 hours before procedure. ?Before procedure: -?2/16: Last dose of warfarin before procedure. -?2/17: Do not take any warfarin or inject any Lovenox. -?2/18: Inject Lovenox?100?mg under the skin once in the morning. Do not take any warfarin.-?2/19:?Inject Lovenox?100?mg under the skin once in the morning. Do not take any warfarin.-?2/20: Inject Lovenox?100?mg under the skin once in the morning. Do not take any warfarin.-?2/21: Inject Lovenox?100?mg under the skin once in the morning. Do not take any warfarin.?Day of procedure:-?2/22: Do not inject any Lovenox before your procedure. After the procedure,?if approved by your doctor, restart Lovenox 100 mg once at night. Take warfarin 7.5 mg at night. ?After procedure:- 2/23: Inject Lovenox 100 mg under the skin at night and take warfarin 5 mg- 2/24: Inject Lovenox 100 mg under the skin at night and take warfarin 5 mg- 2/25:  Test your INR and we will call with further dosing instructions Anticoagulation Summary  As of 06/05/2020  INR goal:  2.0-3.0 TTR:  46.0 % (5.9 y) INR used for dosing:  4.38 (06/05/2020) Warfarin maintenance plan:  2.5 mg (5 mg x 0.5) every Thu; 5 mg (5 mg x 1) all other days Weekly warfarin total:  32.5 mg Plan last modified:  [name], PharmD (06/05/2020) Next INR check:   Priority:  High Priority Target end date:  Indefinite  Indications  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  Electronically Signed by [name], PharmD, June 05, 2020 [facility] Anticoagulation [facility]  [address] [place], [place] [address]  Phone:[phone]  Fax: [phone]

## 2020-06-07 ENCOUNTER — Encounter: Admit: 2020-06-07 | Payer: PRIVATE HEALTH INSURANCE | Primary: Internal Medicine

## 2020-06-11 ENCOUNTER — Encounter: Admit: 2020-06-11 | Payer: PRIVATE HEALTH INSURANCE | Attending: Adult Health | Primary: Internal Medicine

## 2020-06-11 DIAGNOSIS — Z01818 Encounter for other preprocedural examination: Secondary | ICD-10-CM

## 2020-06-12 ENCOUNTER — Ambulatory Visit
Admit: 2020-06-12 | Payer: PRIVATE HEALTH INSURANCE | Attending: Pharmacist Clinician (PhC)/ Clinical Pharmacy Specialist | Primary: Internal Medicine

## 2020-06-12 ENCOUNTER — Encounter
Admit: 2020-06-12 | Payer: PRIVATE HEALTH INSURANCE | Attending: Pharmacist Clinician (PhC)/ Clinical Pharmacy Specialist | Primary: Internal Medicine

## 2020-06-12 ENCOUNTER — Inpatient Hospital Stay: Admit: 2020-06-12 | Discharge: 2020-06-12 | Payer: PRIVATE HEALTH INSURANCE | Primary: Internal Medicine

## 2020-06-12 DIAGNOSIS — K559 Vascular disorder of intestine, unspecified: Secondary | ICD-10-CM

## 2020-06-12 DIAGNOSIS — D6862 Lupus anticoagulant syndrome: Secondary | ICD-10-CM

## 2020-06-12 DIAGNOSIS — Z5181 Encounter for therapeutic drug level monitoring: Secondary | ICD-10-CM

## 2020-06-12 DIAGNOSIS — Z7901 Long term (current) use of anticoagulants: Secondary | ICD-10-CM

## 2020-06-12 LAB — PROTIME AND INR
BKR INR: 3.6 — ABNORMAL HIGH (ref 0.91–1.05)
BKR PROTHROMBIN TIME: 35.8 seconds — ABNORMAL HIGH (ref 10.0–11.6)

## 2020-06-13 ENCOUNTER — Telehealth
Admit: 2020-06-13 | Payer: PRIVATE HEALTH INSURANCE | Attending: Pharmacist Clinician (PhC)/ Clinical Pharmacy Specialist | Primary: Internal Medicine

## 2020-06-13 MED ORDER — OMEGA-3 FATTY ACIDS 1,000 MG CAPSULE
1000 mg | Freq: Every day | ORAL | Status: AC
Start: 2020-06-13 — End: 2021-03-05

## 2020-06-13 NOTE — Telephone Encounter
Pt said she would like Maria [name] to Please give her a call at [phone] Regarding her INR

## 2020-06-13 NOTE — Progress Notes
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  The patient was contacted regarding lab drawn INR results.  Patient's INR findings and prescribed dose from last visit:Anticoagulation Monitoring Latest Ref Rng & Units 05/17/2020 06/05/2020 06/12/2020 INR 2.0 - 3.0 - - - INR 0.91 - 1.10 - - - INR (External) 0.80 - 1.15 - - - Assoc. INR Date - 05/17/2020 06/05/2020 06/12/2020 Associated INR - 2.37 4.38 3.60 Instructions - 2/17: Hold; 2/18: Hold; 2/19: Hold; 2/20: Hold; 2/21: Hold; Otherwise 5 mg every day 2/9: Hold; 2/17: Hold; 2/18: Hold; 2/19: Hold; 2/20: Hold; 2/21: Hold; 2/22: 7.5 mg; 2/24: 5 mg; Otherwise 2.5 mg every Thu; 5 mg all other days 2/16: Hold; 2/17: Hold; 2/18: Hold; 2/19: Hold; 2/20: Hold; 2/21: Hold; 2/22: 7.5 mg; 2/24: 5 mg; Otherwise 2.5 mg every Thu; 5 mg all other days Last 7 day dose total - 40 mg 35 mg 27.5 mg Next INR Date - 05/22/2020 06/12/2020 - The following findings were reported from today's call (no findings if none selected):[]  Missed doses []  Extra doses []  Change in medications []  Change in vitamin k rich food intake (green vegetables, herbals, etc) []  Change in alcohol use []  Change in overall health []  Recent hospitalization / emergency department visit [x]  Upcoming procedure (including dental procedures) []  Other Comments:       Hysteroscopy w/polypectomy on 06/18/20, unable to assess The following side effects were reported from today's call (no side effects if none selected):[]  Bleeding []  New bruising []  Warfarin-emergency department visit or hospital admission []  Other Comments:   Unable to assess, left vm  Assessment/Plan:Patient's INR is 3.6, which is above goal range of 2.0-3.0.  Patient is supposed to stop warfarin as of 2/17 and start bridging as of 2/18. Given INR is above range, she was instructed to stop warfarin as of today, not take anything today or Thursday, and get INR tested on Friday, 2/18 before starting Lovenox. This information was routed to the covering provider for review.  Before procedure: - 2/16: Do not take any warfarin or inject any Lovenox. - 2/17: Do not take any warfarin or inject any Lovenox. - 2/18: Retest the INR. If it is less than 2, inject Lovenox 100 mg under the skin once in the afternoon. Do not take any warfarin. (If it is not less than 2 you will receive different instructions from the warfarin clinic.) - 2/19: Inject Lovenox 100 mg under the skin once in the morning. Do not take any warfarin.- 2/20: Inject Lovenox 100 mg under the skin once in the morning. Do not take any warfarin.- 2/21: Inject Lovenox 100 mg under the skin once in the morning. Do not take any warfarin. Day of procedure:- 2/22: Do not inject any Lovenox before your procedure. After the procedure, if approved by your doctor, restart Lovenox 100 mg once at night. Take warfarin 7.5 mg at night.  After procedure:- 2/23: Inject Lovenox 100 mg under the skin at night and take warfarin 5 mg- 2/24: Inject Lovenox 100 mg under the skin at night and take warfarin 5 mg- 2/25: Test your INR and we will call with further dosing instructions Addendum:Patient called back and she said she didn't get my messages from yesterday. She reports she's still been having tension headaches, and has had a lot  of going on so she was unable to learn how to use MyChart. Confirmed that she did stop taking high doses of Tylenol and has been eating well. She will get INR tested tomorrow, 2/18. 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  Electronically Signed by [name], PharmD, June 12, 2020 [facility] Anticoagulation Clinic  [address] [place], [place] [address]  Phone:[phone]  Fax: [phone]

## 2020-06-13 NOTE — Progress Notes
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  The patient was contacted regarding lab drawn INR results.  Patient's INR findings and prescribed dose from last visit:Anticoagulation Monitoring Latest Ref Rng & Units 05/11/2020 05/16/2020 05/17/2020 INR 2.0 - 3.0 - - - INR 0.91 - 1.10 - - - INR (External) 0.80 - 1.15 - - - Assoc. INR Date - 05/11/2020 05/15/2020 05/17/2020 Associated INR - 1.57 1.90 2.37 Instructions - 7.5 mg every Sat; 5 mg all other days 1/20: 7.5 mg; Otherwise 7.5 mg every Sat; 5 mg all other days 7.5 mg every Sat; 5 mg all other days Last 7 day dose total - 10 mg 37.5 mg 40 mg Next INR Date - 05/14/2020 05/17/2020 - The following findings were reported from today's call (no findings if none selected):[]  Missed doses []  Extra doses []  Change in medications []  Change in vitamin k rich food intake (green vegetables, herbals, etc) []  Change in alcohol use []  Change in overall health []  Recent hospitalization / emergency department visit []  Upcoming procedure (including dental procedures) []  Other Comments:       No findings The following side effects were reported from today's call (no side effects if none selected):[]  Bleeding []  New bruising []  Warfarin-emergency department visit or hospital admission []  Other Comments:   No side effects Assessment/Plan:Patient's INR is 2.37, which is within goal range of 2.0-3.0.  She was instructed to stop the Lovenox injections and continue on warfarin 5 mg daily as indicated in the maintenance plan below.  Next INR assessment due 1 week. This information was routed to the covering provider for review.  Patient is having hysteroscopy on 06/18/20, asked for new Lovenox prescription sent to [facility]. No copay issue with new insurance. Script sent. Will confirm bridging plan with [facility] APRN.  Previous bridging plan: patient is to come off of warfarin for 5 full days prior to procedure, and bridge with Lovenox 1.5 mg/kg daily x 4 days prior to procedure, last dose 24 hours before procedure. Before procedure: - 2/16: Last dose of warfarin before procedure. - 2/17: Do not take any warfarin or inject any Lovenox. - 2/18: Inject Lovenox 100 mg under the skin once in the morning. Do not take any warfarin.- 2/19: Inject Lovenox 100 mg under the skin once in the morning. Do not take any warfarin.- 2/20: Inject Lovenox 100 mg under the skin once in the morning. Do not take any warfarin.- 2/21: Inject Lovenox 100 mg under the skin once in the morning. Do not take any warfarin.?Day of procedure:- 2/22: Do not inject any Lovenox before your procedure. After the procedure, if approved by your doctor, restart Lovenox 100 mg once at night. Take warfarin 7.5 mg at night. After procedure:- 2/23: Inject Lovenox 100 mg under the skin at night and take warfarin 5 mg- 2/24: Inject Lovenox 100 mg under the skin at night and take warfarin 5 mg- 2/25: Test your INR and we will call with further dosing instructions Anticoagulation Summary  As of 05/17/2020  INR goal:  2.0-3.0 TTR:  46.2 % (5.9 y) INR used for dosing:  2.37 (05/17/2020) Warfarin maintenance plan:  5 mg (5 mg x 1) every day Weekly warfarin total:  35 mg Plan last modified:  [name], PharmD (05/17/2020) Next  INR check:  05/22/2020 Priority:  High Priority Target end date:  Indefinite  Indications  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  Electronically Signed by [name], PharmD, May 17, 2020 [facility] Anticoagulation Clinic  [address] [place], [place] [address]  [phone]  Fax: [phone]

## 2020-06-14 ENCOUNTER — Ambulatory Visit
Admit: 2020-06-14 | Payer: PRIVATE HEALTH INSURANCE | Attending: Pharmacist Clinician (PhC)/ Clinical Pharmacy Specialist | Primary: Internal Medicine

## 2020-06-14 ENCOUNTER — Telehealth: Admit: 2020-06-14 | Payer: PRIVATE HEALTH INSURANCE | Attending: Medical Oncology | Primary: Internal Medicine

## 2020-06-14 ENCOUNTER — Inpatient Hospital Stay: Admit: 2020-06-14 | Discharge: 2020-06-14 | Payer: PRIVATE HEALTH INSURANCE | Primary: Internal Medicine

## 2020-06-14 DIAGNOSIS — K559 Vascular disorder of intestine, unspecified: Secondary | ICD-10-CM

## 2020-06-14 DIAGNOSIS — D6862 Lupus anticoagulant syndrome: Secondary | ICD-10-CM

## 2020-06-14 DIAGNOSIS — Z7901 Long term (current) use of anticoagulants: Secondary | ICD-10-CM

## 2020-06-14 DIAGNOSIS — Z5181 Encounter for therapeutic drug level monitoring: Secondary | ICD-10-CM

## 2020-06-14 LAB — PROTIME AND INR
BKR INR: 2.29 — ABNORMAL HIGH (ref 0.91–1.05)
BKR PROTHROMBIN TIME: 23.4 seconds — ABNORMAL HIGH (ref 10.0–11.6)

## 2020-06-14 NOTE — Progress Notes
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  The patient was contacted regarding lab drawn INR results.  Patient's INR findings and prescribed dose from last visit:Anticoagulation Monitoring Latest Ref Rng & Units 06/05/2020 06/12/2020 06/14/2020 INR 2.0 - 3.0 - - - INR 0.91 - 1.10 - - - INR (External) 0.80 - 1.15 - - - Assoc. INR Date - 06/05/2020 06/12/2020 06/14/2020 Associated INR - 4.38 3.60 2.29 Instructions - 2/9: Hold; 2/17: Hold; 2/18: Hold; 2/19: Hold; 2/20: Hold; 2/21: Hold; 2/22: 7.5 mg; 2/24: 5 mg; Otherwise 2.5 mg every Thu; 5 mg all other days 2/16: Hold; 2/17: Hold; 2/18: Hold; 2/19: Hold; 2/20: Hold; 2/21: Hold; 2/22: 7.5 mg; 2/24: 5 mg; Otherwise 2.5 mg every Thu; 5 mg all other days 2/18: Hold; 2/19: Hold; 2/20: Hold; 2/21: Hold; 2/22: 7.5 mg; 2/24: 5 mg; Otherwise 2.5 mg every Thu; 5 mg all other days Last 7 day dose total - 35 mg 27.5 mg 25 mg Next INR Date - 06/12/2020 06/14/2020 06/15/2020 The following findings were reported from today's call (no findings if none selected):[]  Missed doses []  Extra doses []  Change in medications []  Change in vitamin k rich food intake (green vegetables, herbals, etc) []  Change in alcohol use []  Change in overall health []  Recent hospitalization / emergency department visit [x]  Upcoming procedure (including dental procedures) []  Other Comments:       Hysteroscopy on 06/18/20  The following side effects were reported from today's call (no side effects if none selected):[]  Bleeding []  New bruising []  Warfarin-emergency department visit or hospital admission []  Other Comments:   No side effects Assessment/Plan:Patient's INR is 2.29, which is within goal range of 2.0-3.0. She is waiting for INR to drop below 2.0 to start Lovenox injections. Recommend for patient to wait and not take any Lovenox until 2/20 to let INR drop. Plan sent to provider for review. -?2/20: Inject Lovenox?100?mg under the skin once in the morning. Do not take any warfarin.-?2/21: Inject Lovenox?100?mg under the skin once in the morning. Do not take any warfarin.?Day of procedure:-?2/22: Do not inject any Lovenox before your procedure. After the procedure,?if approved by your doctor, restart Lovenox 100 mg once at night. Take warfarin 7.5 mg at night. ?After procedure:- 2/23: Inject Lovenox 100 mg under the skin at night and take warfarin 5 mg- 2/24: Inject Lovenox 100 mg under the skin at night and take warfarin 5 mg- 2/25: Test your INR and we will call with further dosing instructions?Anticoagulation Summary  As of 06/14/2020  INR goal:  2.0-3.0 TTR:  45.9 % (6 y) INR used for dosing:  2.29 (06/14/2020) Warfarin maintenance plan:  2.5 mg (5 mg x 0.5) every Thu; 5 mg (5 mg x 1) all other days Weekly warfarin total:  32.5 mg Plan last modified:  [name], PharmD (06/05/2020) Next INR check:  06/21/2020 Priority:  High Priority Target end date:  Indefinite  Indications  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  Electronically Signed by [name], PharmD, June 14, 2020 [facility] Anticoagulation Clinic  [address], [place], [place] [address]  Phone:[phone]  Fax: [phone]

## 2020-06-14 NOTE — Telephone Encounter
Received call from patient, she would like a call back to discuss her INR results. Please call patient at [phone]

## 2020-06-15 ENCOUNTER — Inpatient Hospital Stay: Admit: 2020-06-15 | Discharge: 2020-06-15 | Payer: PRIVATE HEALTH INSURANCE | Primary: Internal Medicine

## 2020-06-15 DIAGNOSIS — Z01812 Encounter for preprocedural laboratory examination: Secondary | ICD-10-CM

## 2020-06-15 DIAGNOSIS — Z20822 Contact with and (suspected) exposure to covid-19: Secondary | ICD-10-CM

## 2020-06-15 DIAGNOSIS — Z01818 Encounter for other preprocedural examination: Secondary | ICD-10-CM

## 2020-06-15 LAB — COVID-19 CLEARANCE OR FOR PLACEMENT ONLY: BKR SARS-COV-2 RNA (COVID-19) (YH): NOT DETECTED

## 2020-06-18 ENCOUNTER — Encounter: Admit: 2020-06-18 | Payer: PRIVATE HEALTH INSURANCE | Attending: Obstetrics and Gynecology | Primary: Internal Medicine

## 2020-06-18 ENCOUNTER — Inpatient Hospital Stay: Admit: 2020-06-18 | Discharge: 2020-06-18 | Payer: PRIVATE HEALTH INSURANCE | Primary: Internal Medicine

## 2020-06-18 ENCOUNTER — Ambulatory Visit: Admit: 2020-06-18 | Payer: PRIVATE HEALTH INSURANCE | Attending: Anesthesiology | Primary: Internal Medicine

## 2020-06-18 DIAGNOSIS — F32A Depression, unspecified: Secondary | ICD-10-CM

## 2020-06-18 DIAGNOSIS — Z7984 Long term (current) use of oral hypoglycemic drugs: Secondary | ICD-10-CM

## 2020-06-18 DIAGNOSIS — K55039 Acute (reversible) ischemia of large intestine, extent unspecified: Secondary | ICD-10-CM

## 2020-06-18 DIAGNOSIS — Z6829 Body mass index (BMI) 29.0-29.9, adult: Secondary | ICD-10-CM

## 2020-06-18 DIAGNOSIS — E119 Type 2 diabetes mellitus without complications: Secondary | ICD-10-CM

## 2020-06-18 DIAGNOSIS — L719 Rosacea, unspecified: Secondary | ICD-10-CM

## 2020-06-18 DIAGNOSIS — Z86718 Personal history of other venous thrombosis and embolism: Secondary | ICD-10-CM

## 2020-06-18 DIAGNOSIS — N840 Polyp of corpus uteri: Secondary | ICD-10-CM

## 2020-06-18 DIAGNOSIS — E7800 Pure hypercholesterolemia, unspecified: Secondary | ICD-10-CM

## 2020-06-18 DIAGNOSIS — E669 Obesity, unspecified: Secondary | ICD-10-CM

## 2020-06-18 DIAGNOSIS — Z885 Allergy status to narcotic agent status: Secondary | ICD-10-CM

## 2020-06-18 DIAGNOSIS — E049 Nontoxic goiter, unspecified: Secondary | ICD-10-CM

## 2020-06-18 DIAGNOSIS — Z881 Allergy status to other antibiotic agents status: Secondary | ICD-10-CM

## 2020-06-18 DIAGNOSIS — D6862 Lupus anticoagulant syndrome: Secondary | ICD-10-CM

## 2020-06-18 DIAGNOSIS — N950 Postmenopausal bleeding: Secondary | ICD-10-CM

## 2020-06-18 DIAGNOSIS — Z7901 Long term (current) use of anticoagulants: Secondary | ICD-10-CM

## 2020-06-18 DIAGNOSIS — Z91040 Latex allergy status: Secondary | ICD-10-CM

## 2020-06-18 DIAGNOSIS — Z79899 Other long term (current) drug therapy: Secondary | ICD-10-CM

## 2020-06-18 DIAGNOSIS — K436 Other and unspecified ventral hernia with obstruction, without gangrene: Secondary | ICD-10-CM

## 2020-06-18 DIAGNOSIS — N854 Malposition of uterus: Secondary | ICD-10-CM

## 2020-06-18 DIAGNOSIS — R42 Dizziness and giddiness: Secondary | ICD-10-CM

## 2020-06-18 DIAGNOSIS — N856 Intrauterine synechiae: Secondary | ICD-10-CM

## 2020-06-18 MED ORDER — NALOXONE 0.4 MG/ML INJECTION SOLUTION
0.4 mg/mL | INTRAVENOUS | Status: DC | PRN
Start: 2020-06-18 — End: 2020-06-18

## 2020-06-18 MED ORDER — FERRIC SUBSULFATE 259 MG/G TOPICAL SOLUTION
259 mg/g | Status: CP
Start: 2020-06-18 — End: ?

## 2020-06-18 MED ORDER — PROPOFOL 10 MG/ML INTRAVENOUS EMULSION
10 mg/mL | INTRAVENOUS | Status: DC | PRN
Start: 2020-06-18 — End: 2020-06-18
  Administered 2020-06-18: 13:00:00 10 mg/mL via INTRAVENOUS

## 2020-06-18 MED ORDER — ACETAMINOPHEN 500 MG TABLET
500 mg | Status: CP
Start: 2020-06-18 — End: ?
  Administered 2020-06-18: 12:00:00 500 mg via ORAL

## 2020-06-18 MED ORDER — DIPHENHYDRAMINE 50 MG/ML INJECTION SOLUTION
50 mg/mL | Status: CP
Start: 2020-06-18 — End: ?

## 2020-06-18 MED ORDER — FENTANYL (PF) 50 MCG/ML INJECTION SOLUTION
50 mcg/mL | INTRAVENOUS | Status: DC | PRN
Start: 2020-06-18 — End: 2020-06-18

## 2020-06-18 MED ORDER — FENTANYL (PF) 50 MCG/ML INJECTION SOLUTION
50 mcg/mL | INTRAVENOUS | Status: DC | PRN
Start: 2020-06-18 — End: 2020-06-18
  Administered 2020-06-18 (×2): 50 mcg/mL via INTRAVENOUS

## 2020-06-18 MED ORDER — PROPOFOL 10 MG/ML INTRAVENOUS EMULSION
10 mg/mL | INTRAVENOUS | Status: DC | PRN
Start: 2020-06-18 — End: 2020-06-18
  Administered 2020-06-18: 13:00:00 10 mL/h via INTRAVENOUS

## 2020-06-18 MED ORDER — FENTANYL (PF) 50 MCG/ML INJECTION SOLUTION
50 mcg/mL | Status: CP
Start: 2020-06-18 — End: ?

## 2020-06-18 MED ORDER — SODIUM CHLORIDE 0.9 % (FLUSH) INJECTION SYRINGE
0.9 % | Freq: Three times a day (TID) | INTRAVENOUS | Status: DC
Start: 2020-06-18 — End: 2020-06-18

## 2020-06-18 MED ORDER — CHLORHEXIDINE GLUCONATE 0.12 % MOUTHWASH
0.12 % | Freq: Once | OROMUCOSAL | Status: CP
Start: 2020-06-18 — End: ?
  Administered 2020-06-18: 12:00:00 0.12 mL via OROMUCOSAL

## 2020-06-18 MED ORDER — FENTANYL (PF) 50 MCG/ML INJECTION SOLUTION
50 mcg/mL | INTRAVENOUS | Status: DC | PRN
Start: 2020-06-18 — End: 2020-06-18
  Administered 2020-06-18 (×2): 50 mL via INTRAVENOUS

## 2020-06-18 MED ORDER — ACETAMINOPHEN 325 MG TABLET
325 mg | Status: CP
Start: 2020-06-18 — End: ?

## 2020-06-18 MED ORDER — DEXAMETHASONE SODIUM PHOSPHATE 4 MG/ML INJECTION SOLUTION
4 mg/mL | INTRAVENOUS | Status: DC | PRN
Start: 2020-06-18 — End: 2020-06-18
  Administered 2020-06-18: 13:00:00 4 mg/mL via INTRAVENOUS

## 2020-06-18 MED ORDER — LACTATED RINGERS INTRAVENOUS SOLUTION
INTRAVENOUS | Status: DC | PRN
Start: 2020-06-18 — End: 2020-06-18
  Administered 2020-06-18: 13:00:00 via INTRAVENOUS

## 2020-06-18 MED ORDER — BUPIVACAINE (PF) 0.5 % (5 MG/ML) INJECTION SOLUTION
0.5 % (5 mg/mL) | Status: CP
Start: 2020-06-18 — End: ?

## 2020-06-18 MED ORDER — CHLORHEXIDINE GLUCONATE 0.12 % MOUTHWASH
0.12 % | Status: CP
Start: 2020-06-18 — End: ?

## 2020-06-18 MED ORDER — DIPHENHYDRAMINE 50 MG/ML INJECTION SOLUTION
50 mg/mL | Freq: Once | INTRAVENOUS | Status: CP | PRN
Start: 2020-06-18 — End: ?
  Administered 2020-06-18: 15:00:00 50 mL via INTRAVENOUS

## 2020-06-18 MED ORDER — ONDANSETRON HCL (PF) 4 MG/2 ML INJECTION SOLUTION
42 mg/2 mL | INTRAVENOUS | Status: DC | PRN
Start: 2020-06-18 — End: 2020-06-18

## 2020-06-18 MED ORDER — LIDOCAINE (PF) 20 MG/ML (2 %) INTRAVENOUS SOLUTION
20 mg/mL (2 %) | INTRAVENOUS | Status: DC | PRN
Start: 2020-06-18 — End: 2020-06-18
  Administered 2020-06-18: 13:00:00 20 mg/mL (2 %) via INTRAVENOUS

## 2020-06-18 MED ORDER — ACETAMINOPHEN 500 MG TABLET
500 mg | Freq: Once | ORAL | Status: CP
Start: 2020-06-18 — End: ?
  Administered 2020-06-18: 12:00:00 500 mg via ORAL

## 2020-06-18 MED ORDER — SODIUM CHLORIDE 0.9 % (FLUSH) INJECTION SYRINGE
0.9 % | INTRAVENOUS | Status: DC | PRN
Start: 2020-06-18 — End: 2020-06-18

## 2020-06-18 MED ORDER — ONDANSETRON HCL (PF) 4 MG/2 ML INJECTION SOLUTION
4 mg/2 mL | INTRAVENOUS | Status: DC | PRN
Start: 2020-06-18 — End: 2020-06-18
  Administered 2020-06-18: 13:00:00 4 mg/2 mL via INTRAVENOUS

## 2020-06-18 MED ORDER — SODIUM CHLORIDE 0.9 % IRRIGATION SOLUTION
0.9 % irrigation | Status: CP | PRN
Start: 2020-06-18 — End: ?
  Administered 2020-06-18 (×2): 0.9 % irrigation

## 2020-06-18 NOTE — Other
Post Anesthesia Transfer of Care NotePatient: Maria RiveraProcedure(s) Performed: Procedure(s) (LRB):ERAS hysteroscopy, polypectomy, D&C, lysis of adhesions (N/A) Patient location: PACU Last Vitals: Vitals Value Taken Time BP 122/84 06/18/20 0911 Temp 96.9 06/18/20 0911 Pulse 72 06/18/20 0911 Resp 13 06/18/20 0911 SpO2 100 06/18/20 0911 Level of consciousness: awakeTransport Vital Signs:  Stable since the last set of recorded intra-operative vital signsIntra-operative Complications: noneIntra-operative Intake & Output and Antibiotics as per Anesthesia record and discussed with the RN.

## 2020-06-18 NOTE — Discharge Instructions
Post-Operative Instructions for HysteroscopyWhat to doTODAY: Rest as much as possible, but you do not need to stay in bed.1.	Pain: You may take Tylenol 650 mg every 6 hours. After 1-2 days, you can instead take only as needed.2.	Plan to resume your lovenox and warfarin tomorrow morning as recommended by your hematology doctor3.	Nothing in the vagina for 2 weeks (this includes tampons, vaginal intercourse, douching) because the risk of vaginal infections is higher after the procedure.4.	Avoid alcoholic beverages on the day of the procedure (they can cause an adverse reaction in combination with the anesthesia you received).5.	Do not drive a car, use equipment that may cause injury, or make important decisions for at least 24 hours after you recover from sedation.6.	Bathing: You may take a shower or sponge bath as early as the day of surgery, but do not sit in a tub or go swimming for 1 week after the procedure to reduce the risk of vaginal infections.What to expect1.	Pain: Most women have cramping similar to a period that can last for a couple of days.2.	Bleeding: You may have as much bleeding as a period for up to one week. Depending on your activity, spotting may occur for up to two months. This bleeding is not your period.3.	Activity: Most women feel ready to return to their usual activities the day after their procedure.4.	Return of normal menses: Typically 3-6 weeks after the procedure in menstruating women.Warning signs: If you have any of these warning signs, call your doctor1.	Bleeding: Heavy bleeding is 2 soaked pads every hour for 2 hours in a row or 3 pads in one hour. Call if you: (1) have heavy bleeding, (2) are passing large blood clots (larger than a quarter), or (3) feel lightheaded, dizzy, or faint.2.	Fever: A fever is a temperature above 100.4 deg F. It is not necessary to take your temperature regularly, but do check your temperature with a thermometer if you feel warm.3.	Severe abdominal or back pain unrelieved by the medications recommended by your doctor4.	Foul-smelling vaginal discharge 5.	Rash or hives Follow-Up: Arrange a follow-up appointment with your doctor or the office in 2-4 weeks. Please call the office if you have any concerns prior to your follow-up appointment.1.	Follow-up is important because it will allow your provider to see how you are feeling after the procedure. It will also give you the chance to ask any questions you may have.2.	During your follow-up visit, you will discuss the findings of the procedure and the results of any tests that were sent. If any further procedures are recommended, you will be able to schedule them at this time.

## 2020-06-18 NOTE — Anesthesia Post-Procedure Evaluation
Anesthesia Post-op NotePatient: Maria RiveraProcedure(s):  Procedure(s) (LRB):ERAS hysteroscopy, polypectomy, D&C, lysis of adhesions (N/A) Patient location: PACULast Vitals:  I have noted the vital signs as listed in the nursing notes.Mental status recovered: patient participates in evaluation: YesVital signs reviewed: YesRespiratory function stable:YesAirway is patent: YesCardiovascular function and hydration status stable: YesPain control satisfactory: YesNausea and vomiting control satisfactory:YesNo complications documented.

## 2020-06-18 NOTE — Other
Operative Diagnosis:Pre-op:   Post-menopausal bleeding [Z61.0]Endometrial polyp [N84.0] Patient Coded Diagnosis   Pre-op diagnosis: Post-menopausal bleeding, Endometrial polyp  Post-op diagnosis: Post-menopausal bleeding, Endometrial polyp  Patient Diagnosis   Pre-op diagnosis: Post-menopausal bleeding [N95.0], Endometrial polyp [N84.0]  Post-op diagnosis:     Post-op diagnosis:   * Post-menopausal bleeding [N95.0]   * Endometrial polyp [W96.0]Operative Procedure(s) :Procedure(s) (LRB):ERAS hysteroscopy, polypectomy, D&C, lysis of adhesions (N/A)Post-op Procedure & Diagnosis ConfirmationPost-op Diagnosis: Post-op Diagnosis confirmed (no changes)Post-op Procedure: Post-op Procedure confirmed (no changes)

## 2020-06-18 NOTE — Other
Surgically Focused H&P Maria [name] is a 70 y.o. yo [id number] here for scheduled hysteroscopy, polypectomy D&C for endometrial polyp.Patient feels well and has no complaints. 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  Patient continues to desire planned surgery.All questions answered.BP (!) 149/63  - Pulse 84  - Temp 97.8 ?F (36.6 ?C) (Temporal)  - Resp 19  - Ht 4' 11 (1.499 m)  - Wt 65.8 kg  - SpO2 98%  - BMI 29.29 kg/m?   Gen: Well appearing, no acute distressCV: RRR, nl s1, s2Pulm: CTAB, normal work of breathingAbdomen: Soft, nontenderLE: Symmetric, nontender, no palpable cords.TVUS 04/05/20: IMPRESSION:6 mm round echogenic lesion in the left uterine endometrium with the level of the cornua concerning for a polyp. Underlying neoplasm cannot be excluded. Tissue biopsy is recommended. A/P- Proceed to OR as planned with Dr. Prescilla Sours for hysteroscopy, polypectomy D&C for endometrial polyp.Subsequent to admission for surgery or invasive procedure, I have reassessed the patient by examination and review of relevant data pertaining to the planned procedure. I have verified the planned procedure and there are no relevant changes since the H&P.Wadie Lessen [facility] Obstetrics and Gynecology, PGY22/22/2022Electronically Signed by Henri Medal, MD

## 2020-06-18 NOTE — Anesthesia Pre-Procedure Evaluation
This is a 70 y.o. female scheduled for ERAS hysteroscopy, polypectomy, D&C (N/A ).Review of Systems/ Medical HistoryPatient summary, nursing notes, EKG/Cardiac Studies , Labs, pre-procedure vitals, height, weight and NPO status reviewed.No previous anesthesia concernsAnesthesia Evaluation:   No history of anesthetic complications  Perioperative Screening ToolOSA Screening risk factor: not at risk for OSA based on screening questionnaire.Estimated body mass index is 29.29 kg/m? as calculated from the following:  Height as of this encounter: 4' 11 (1.499 m).  Weight as of this encounter: 65.8 kg. CC/HPI: Patient is a 70 y.o female with PMB and polyp on [place] presenting for hysteroscopy, polypectomy, D&C with Dr. Prescilla Sours on 2/22/22Anesthesia type: GENERAL1/7-1/12/22 Hospialization for abdominal pain - [place] scan that showed pSBO and an NGT was placed. She had some improvement in symptoms after NGT placement and she had + flatus. She had an NGT clamp trial on 05/06/2020 and was able to have tube removed and start clear liquid diet.Past Surgical History:  2016: ABDOMINAL ADHESION SURGERY02/2018: BLEPHAROPLASTY; Bilateral1/31/15: COLOSTOMYNo date: COLOSTOMY CLOSURE2004: KNEE LIGAMENT RECONSTRUCTION; RightNo date: LAPAROSCOPIC NISSEN FUNDOPLICATIONNo date: SINUS SURGERYNo date: THYROID BIOPSYNo date: TUBAL LIGATIONNo date: UMBILICAL HERNIA REPAIRNo date: VENTRAL HERNIA REPAIRPAH: 04/09/20; ETT  Size(mm): 6.5; Mac; VC Grade: I1/23/20; LMA Type: i-gelCardiovascular:Patient has a history of: hypercholesterolemia. -Exercise tolerance: >4 METS -Vascular Disease:  deep vein thrombosis.   -Other Cardiovascular:  Patient denies any history of coronary artery disease, heart failure, dysrhythmias, or valvular heart disease. Patient denies any chest pain, palpitations, SOB, dyspnea on exertion, orthopnea, paroxysmal nocturnal dyspnea or leg swelling. Able to climb 2 flights of stairs without SOB, chest pain or palpitations. 12/21 EKG; Sinus rhythm VR 85bpm2/2/15 Echo 1.Left true size is at the lower limit of normal. Hyperdynamic left ventricle systolic function with an estimated ejection fraction greater than 75%.2.Mild enlargement of the right-sided chambers.3.There is mild aortic sclerosis without stenosis or insufficiency.4.No evidence of intracardiac thrombus seen in limited views.[name] Respiratory:  Negative.HEENT: Negative.Neuromuscular: NegativeSkeletal/Skin:  NegativeGastrointestinal/Genitourinary: -Gastrointestinal Disorders:  Patient has small bowel obstruction. Patient has hernia (s.p repair ).-Nutritional Disorders: Patient has has increased body weight- obesity.-Comments: Ischemic colitis12/10/21 Transvaginal ultrasound with limited DopplerFindings:Uterus measures 6.3 x 3.4 x 4.1 cm and demonstrates heterogeneous echotexture. 6 mm echogenic structure is seen in the left uterus endometrium, at the level of the cornua, likely represents an polyp, underlying neoplasm cannot be excluded.Endometrial stripe measures 0.4 cm.No free fluid seen within the cul-de-sac.  The bilateral ovaries are not well visualized. No adnexal masses are identified.  IMPRESSION: 6 mm round echogenic lesion in the left uterine endometrium with the level of the cornua concerning for a polyp. Underlying neoplasm cannot be excluded. Tissue biopsy is recommended.05/03/20 XR ABDOMEN APIMPRESSION: Small bowel dilatation has decreased. 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 Endocrine/Metabolic: -Diabetes mellitus:  Patient has diabetes mellitus type 2.-Thyroid Disorders:  Patient has a nontoxic goiter.Behavioral/Psychiatric & Syndromes:  Patient has depression.Additional Findings: 05/17/20 [name], PharmD Previous bridging plan: patient is to come off of warfarin for 5 full days prior to procedure, and bridge with Lovenox 1.5 mg/kg daily x 4 days prior to procedure, last dose  24 hours before procedure.Physical ExamCardiovascular:    Rhythm: regularPulmonary:  Patient's breath sounds clear to auscultationAirway:  Mallampati: IITM distance: >3 FBNeck ROM: fullDental:  Dentition: chipped and crownOther Findings: Information obtained over the telephone. No physical examination was performed during this PAT visit. Anesthesia PlanASA 3 The primary anesthesia plan is  general LMA. Lupus anticoagulant disorderPerioperative Code Status confirmed: It is my understanding that the patient is currently designated as 'Full Code' and will remain so throughout the perioperative period.Anesthesia informed consent obtained. Consent obtained from: patientUse of blood products: consented  The post operative pain plan is IV analgesics and per surgeon management.Plan discussed with Resident.Anesthesiologist's Pre Op NoteI personally evaluated and examined the patient prior to the intra-operative phase of care.  Risks/Benefits/Options of General Anesthesia discussed with and accepted by pt. All questions answered. Risks include but not limited to: PONV (post-op nausea and vomiting), sore throat, lip/dental/gum/tongue damage, aspiration, allergic reactions, awareness under anesthesia, postop mechanical ventilation, ICU stay and blood transfusions.All questions answered. Patient agrees to proceed.

## 2020-06-19 NOTE — Other
OPERATIVE REPORTHysteroscopy and D&CPATIENT INFORMATION Patient Data:  Patient Name: Maria [name] Age: 70 y.o. DOB: Sep 19, 1950	 MRN: [id number]	 GENERAL DATE OF PROCEDURE/SURGERY:  2/22/2022OPERATION: Procedure(s) and Anesthesia Type:   * ERAS hysteroscopy, polypectomy, D&C, lysis of adhesions - GENERALINDICATION FOR SURGERY: 70 y.o. [id number] PMHx lupus AC with hx ischemic colitis on warfarin with postmenopausal bleeding and ultrasound findings suggestive of endometrial polyp presenting for hysteroscopy, polypectomy and D&C. Patient transitioned to lovenox perioperatively with plan to bridge to warfarin on POD1. SURGEONS: Surgeon(s) and Role:   * McKenney, Clyda Greener, MD - PrimaryOR STAFF: Circulator: Sherrill Raring, RNScrub Person: Arleta Creek, JoseResident: Valora Piccolo, Lowell Guitar, MD ANESTHESIA: General LMASIGNS OF INFECTION AT OPERATIVE SITE?: No PREOP DIAGNOSIS: Postmenopausal bleeding; endometrial polyp POSTOP DIAGNOSIS: Same, intrauterine adhesionESTIMATED BLOOD LOSS:  Minimal BLOOD PRODUCTS: none                 FLUIDS REPLACED:  800 cc crystalloidFLUID DEFICIT:  200 ccURINE OUTPUT: unmeasuredDRAINS & IMPLANTS: NoneCOMPLICATIONS:  NoneFINDINGS:  EUA showed small mobile, retroverted uterus with 1cm cervical tissue protrusion at 11 o'clock position. Hysteroscopic evaluation with atrophic appearing endometrium along elongated endometrial canal with tubal ostia separated by large adhesion. Endometrial canal to left side of adhesion revealing multiple small endometrial polyps which were removed with hysteroscopic graspers. Intervening septum incised gently with hysteroscopic scissors revealing tissue consistent with soft fibrous adhesion. 1. Endometrial cavity divided by fibrous septum suggestive of intrauterine adhesion2. Normal endocervical canal 3. Endometrial curettage performed in bilateral endometrial canals for global samplingSPECIMENS: Endometrial curettingsPROCEDURE Maria [name] is a 70 y.o. [id number] presenting with postmenopausal bleeding. The patient was taken to the Operating Room where a time out was performed identifying the correct patient and procedure. General anesthesia with LMA was introduced without incident.  The patient was placed in dorsal lithotomy. An EUA was performed which noted above findings. The patient was then prepped and draped in the usual sterile fashion.  A surgical pause was performed, again confirming the correct patient and procedure. A speculum was inserted and the cervix was visualized. The anterior lip was grasped with a single tooth tenaculum and the cervix progressively dilated to 15mm with the Hank dilators. The hysteroscope was introduced and the endometrial cavity visualized. Bilateral ostia were visualized as described above separated by fibrous septum. There were three discrete polyps or fibroids visualized and removed with hysteroscopic graspers. Hysteroscopic scissors were then used to gently incise fibrous septum which appeared consistent with intrauterine adhesion. The hysteroscope was removed and a sharp curette was introduced. Endometrial curettage was performed and tissue collected for pathology. Good cri was found 360 degrees. The tenaculum was removed and the sites found to be hemostatic. The speculum was removed and the patient was returned to the supine position. The patient tolerated the procedure well. Anesthesia was reversed, and the patient was transported to the PACU in stable condition. Electronically Signed by Henri Medal, MD, June 18, 2020

## 2020-06-20 ENCOUNTER — Telehealth: Admit: 2020-06-20 | Payer: PRIVATE HEALTH INSURANCE | Attending: Medical Oncology | Primary: Internal Medicine

## 2020-06-20 NOTE — Telephone Encounter
rec'd call from patient she would like a return call from Nadya  Re: INR [phone]

## 2020-06-21 ENCOUNTER — Ambulatory Visit
Admit: 2020-06-21 | Payer: PRIVATE HEALTH INSURANCE | Attending: Pharmacist Clinician (PhC)/ Clinical Pharmacy Specialist | Primary: Internal Medicine

## 2020-06-21 ENCOUNTER — Telehealth: Admit: 2020-06-21 | Payer: PRIVATE HEALTH INSURANCE | Attending: Medical Oncology | Primary: Internal Medicine

## 2020-06-21 DIAGNOSIS — D6862 Lupus anticoagulant syndrome: Secondary | ICD-10-CM

## 2020-06-21 DIAGNOSIS — K559 Vascular disorder of intestine, unspecified: Secondary | ICD-10-CM

## 2020-06-21 NOTE — Progress Notes
Patient unable to get to the lab today, 2/25 due to the weather. She started the warfarin and Lovenox after her procedure on 2/23 per surgeon's instructions. She took 2 days of 7.5 mg. Patient to test INR on Saturday, 2/26 morning. If INR if 2.0 or higher, stop enoxaparin, continue on warfarin 5 mg daily. If INR is 3.3 or higher, stop Lovenox and skip warfarin on Sat, take 5 mg on Sunday.  If INR is < 2.0, continue on Lovenox and warfarin 5 mg daily and retest INR on 2/28. Plan sent to provider for review.

## 2020-06-21 NOTE — Telephone Encounter
Received call from patient, she can not get her labs drawn today due to the weather. Patient would like a call back from Nadya to discuss, please call patient at [phone]

## 2020-06-22 ENCOUNTER — Inpatient Hospital Stay: Admit: 2020-06-22 | Discharge: 2020-06-22 | Payer: PRIVATE HEALTH INSURANCE | Primary: Internal Medicine

## 2020-06-22 ENCOUNTER — Ambulatory Visit
Admit: 2020-06-22 | Payer: PRIVATE HEALTH INSURANCE | Attending: Pharmacist Clinician (PhC)/ Clinical Pharmacy Specialist | Primary: Internal Medicine

## 2020-06-22 DIAGNOSIS — Z7901 Long term (current) use of anticoagulants: Secondary | ICD-10-CM

## 2020-06-22 DIAGNOSIS — K559 Vascular disorder of intestine, unspecified: Secondary | ICD-10-CM

## 2020-06-22 DIAGNOSIS — D6862 Lupus anticoagulant syndrome: Secondary | ICD-10-CM

## 2020-06-22 DIAGNOSIS — Z5181 Encounter for therapeutic drug level monitoring: Secondary | ICD-10-CM

## 2020-06-22 LAB — PROTIME AND INR
BKR INR: 1.35 — ABNORMAL HIGH (ref 0.87–1.14)
BKR PROTHROMBIN TIME: 14.3 seconds — ABNORMAL HIGH (ref 9.6–12.3)

## 2020-06-22 NOTE — Progress Notes
Pharmacist Anticoagulation Clinic Telephone Visit Follow Maria [name] (1950/08/08) is on chronic anticoagulation for the diagnosis of  Lupus anticoagulant with hypercoagulable state (HC Code) (HC CODE)  (primary encounter diagnosis) Ischemic colitis (HC Code) (HC CODE) with an INR goal of 2.0-3.0.  The patient was contacted regarding lab drawn INR results.  Patient's INR findings and prescribed dose from last visit:Anticoagulation Monitoring Latest Ref Rng & Units 06/14/2020 06/21/2020 06/22/2020 INR 2.0 - 3.0 - - - INR 0.91 - 1.10 - - - INR (External) 0.80 - 1.15 - - - Assoc. INR Date - 06/14/2020 - 06/22/2020 Associated INR - 2.29 - 1.35 Instructions - 2/18: Hold; 2/19: Hold; 2/20: Hold; 2/21: Hold; 2/22: 7.5 mg; 2/24: 5 mg; Otherwise 2.5 mg every Thu; 5 mg all other days 2.5 mg every Thu; 5 mg all other days 2.5 mg every Thu; 5 mg all other days Last 7 day dose total - 30 mg 15 mg 20 mg Next INR Date - 06/21/2020 06/22/2020 06/25/2020 The following findings were reported from today's call (no findings if none selected):[]  Missed doses []  Extra doses []  Change in medications []  Change in vitamin k rich food intake (green vegetables, herbals, etc) []  Change in alcohol use []  Change in overall health []  Recent hospitalization / emergency department visit []  Upcoming procedure (including dental procedures) []  Other Comments:       No findings The following side effects were reported from today's call (no side effects if none selected):[]  Bleeding []  New bruising []  Warfarin-emergency department visit or hospital admission []  Other Comments:   No side effects Assessment/Plan:Patient's INR is 1.35, which is below goal range of 2.0-3.0.  She was instructed to continue Lovenox 100 mg SC daily injections and warfarin 5 mg daily as indicated in the maintenance plan below.  Next INR assessment due in 3 days. This information was routed to the covering provider for review.Anticoagulation Summary  As of 06/22/2020  INR goal:  2.0-3.0 TTR:  45.8 % (6 y) INR used for dosing:  1.35 (06/22/2020) Warfarin maintenance plan:  2.5 mg (5 mg x 0.5) every Thu; 5 mg (5 mg x 1) all other days Weekly warfarin total:  32.5 mg Plan last modified:  [name], PharmD (06/05/2020) Next INR check:  06/25/2020 Priority:  High Priority Target end date:  Indefinite  Indications  Lupus anticoagulant with hypercoagulable state (HC Code) (HC CODE) [D68.62]Ischemic colitis (HC Code) (HC CODE) [K55.9]   Anticoagulation Episode Summary   INR check location:    Preferred lab:    Send INR reminders to:  [facility] [place] INR POOL  Comments:    Anticoagulation Care Providers   Provider Role Specialty Phone number  [name], MD Referring Medical Oncology [phone]  [place], [name], APRN Responsible Medical Oncology [phone]  All patient's medications have been reviewed and updated as needed.  Electronically Signed by [name], PharmD, June 22, 2020 [facility] Anticoagulation Clinic  [address] [place], [place] [address]  [phone]  Fax: [phone]

## 2020-06-25 ENCOUNTER — Ambulatory Visit
Admit: 2020-06-25 | Payer: PRIVATE HEALTH INSURANCE | Attending: Pharmacist Clinician (PhC)/ Clinical Pharmacy Specialist | Primary: Internal Medicine

## 2020-06-25 ENCOUNTER — Inpatient Hospital Stay: Admit: 2020-06-25 | Discharge: 2020-06-25 | Payer: PRIVATE HEALTH INSURANCE | Primary: Internal Medicine

## 2020-06-25 DIAGNOSIS — K559 Vascular disorder of intestine, unspecified: Secondary | ICD-10-CM

## 2020-06-25 DIAGNOSIS — D6862 Lupus anticoagulant syndrome: Secondary | ICD-10-CM

## 2020-06-25 DIAGNOSIS — Z5181 Encounter for therapeutic drug level monitoring: Secondary | ICD-10-CM

## 2020-06-25 DIAGNOSIS — Z7901 Long term (current) use of anticoagulants: Secondary | ICD-10-CM

## 2020-06-25 LAB — PROTIME AND INR
BKR INR: 1.99 — ABNORMAL HIGH (ref 0.91–1.05)
BKR PROTHROMBIN TIME: 20.5 seconds — ABNORMAL HIGH (ref 10.0–11.6)

## 2020-06-25 NOTE — Progress Notes
Pharmacist Anticoagulation Clinic Telephone Visit Follow Maria [name] (10/13/1950) is on chronic anticoagulation for the diagnosis of  Lupus anticoagulant with hypercoagulable state (HC Code) (HC CODE)  (primary encounter diagnosis) Ischemic colitis (HC Code) (HC CODE) with an INR goal of 2.0-3.0.  The patient was contacted regarding lab drawn INR results.  Patient's INR findings and prescribed dose from last visit:Anticoagulation Monitoring Latest Ref Rng & Units 06/21/2020 06/22/2020 06/25/2020 INR 2.0 - 3.0 - - - INR 0.91 - 1.10 - - - INR (External) 0.80 - 1.15 - - - Assoc. INR Date - - 06/22/2020 06/25/2020 Associated INR - - 1.35 1.99 Instructions - 2.5 mg every Thu; 5 mg all other days 2.5 mg every Thu; 5 mg all other days 3/1: 7.5 mg; 3/3: 5 mg; Otherwise 2.5 mg every Thu; 5 mg all other days Last 7 day dose total - 15 mg 20 mg 35 mg Next INR Date - 06/22/2020 06/25/2020 07/01/2020 The following findings were reported from today's call (no findings if none selected):[]  Missed doses []  Extra doses []  Change in medications []  Change in vitamin k rich food intake (green vegetables, herbals, etc) []  Change in alcohol use []  Change in overall health []  Recent hospitalization / emergency department visit []  Upcoming procedure (including dental procedures) []  Other Comments:       No findings The following side effects were reported from today's call (no side effects if none selected):[]  Bleeding []  New bruising []  Warfarin-emergency department visit or hospital admission []  Other Comments:   No side effects Assessment/Plan:Patient's INR is 1.99, which is within goal range of 2.0-3.0.  She was instructed to stop the Lovenox injections after taking a last injection today and to take warfarin 7.5 mg today and 5 mg all other days as indicated in the maintenance plan below. At this time, patient is requiring higher doses of warfarin compared to what she was taking prior to hysteroscopy. May require dose adjustments in the near future. Do not go out a full month until next INR if next week is in range. Next INR assessment due 1 week. This information was routed to the covering provider for review.Anticoagulation Summary  As of 06/25/2020  INR goal:  2.0-3.0 TTR:  45.8 % (6 y) INR used for dosing:  1.99 (06/25/2020) Warfarin maintenance plan:  2.5 mg (5 mg x 0.5) every Thu; 5 mg (5 mg x 1) all other days Weekly warfarin total:  32.5 mg Plan last modified:  [name], PharmD (06/05/2020) Next INR check:  07/01/2020 Priority:  High Priority Target end date:  Indefinite  Indications  Lupus anticoagulant with hypercoagulable state (HC Code) (HC CODE) [D68.62]Ischemic colitis (HC Code) (HC CODE) [K55.9]   Anticoagulation Episode Summary   INR check location:    Preferred lab:    Send INR reminders to:  [facility] [place] INR POOL  Comments:    Anticoagulation Care Providers   Provider Role Specialty Phone number  [name], MD Referring Medical Oncology [phone]  [place], [name], APRN Responsible Medical Oncology [phone]  All patient's medications have been reviewed and updated as needed.  Electronically Signed by [name], PharmD, June 25, 2020 [facility] Anticoagulation Clinic  [address], [place], [place] [address]  Phone:[phone]  Fax: [phone]

## 2020-07-01 ENCOUNTER — Inpatient Hospital Stay: Admit: 2020-07-01 | Discharge: 2020-07-01 | Payer: PRIVATE HEALTH INSURANCE | Primary: Internal Medicine

## 2020-07-01 ENCOUNTER — Ambulatory Visit: Admit: 2020-07-01 | Payer: PRIVATE HEALTH INSURANCE | Attending: Pharmacotherapy | Primary: Internal Medicine

## 2020-07-01 ENCOUNTER — Encounter: Admit: 2020-07-01 | Payer: PRIVATE HEALTH INSURANCE | Attending: Pharmacotherapy | Primary: Internal Medicine

## 2020-07-01 DIAGNOSIS — E119 Type 2 diabetes mellitus without complications: Secondary | ICD-10-CM

## 2020-07-01 DIAGNOSIS — Z5181 Encounter for therapeutic drug level monitoring: Secondary | ICD-10-CM

## 2020-07-01 DIAGNOSIS — Z7901 Long term (current) use of anticoagulants: Secondary | ICD-10-CM

## 2020-07-01 DIAGNOSIS — R42 Dizziness and giddiness: Secondary | ICD-10-CM

## 2020-07-01 DIAGNOSIS — K559 Vascular disorder of intestine, unspecified: Secondary | ICD-10-CM

## 2020-07-01 DIAGNOSIS — D6862 Lupus anticoagulant syndrome: Secondary | ICD-10-CM

## 2020-07-01 DIAGNOSIS — K55039 Acute (reversible) ischemia of large intestine, extent unspecified: Secondary | ICD-10-CM

## 2020-07-01 DIAGNOSIS — F32A Depression: Secondary | ICD-10-CM

## 2020-07-01 DIAGNOSIS — K436 Other and unspecified ventral hernia with obstruction, without gangrene: Secondary | ICD-10-CM

## 2020-07-01 DIAGNOSIS — L719 Rosacea, unspecified: Secondary | ICD-10-CM

## 2020-07-01 DIAGNOSIS — E7800 Pure hypercholesterolemia, unspecified: Secondary | ICD-10-CM

## 2020-07-01 LAB — PROTIME AND INR
BKR INR: 3.21 — ABNORMAL HIGH (ref 0.91–1.05)
BKR PROTHROMBIN TIME: 32.1 seconds — ABNORMAL HIGH (ref 10.0–11.6)

## 2020-07-01 NOTE — Progress Notes
Pharmacist Anticoagulation Clinic Telephone Visit Follow Harlem Thresher ([birth year]) is on chronic anticoagulation for the diagnosis of  Lupus anticoagulant with hypercoagulable state (HC Code) (HC CODE)  (primary encounter diagnosis) Ischemic colitis (HC Code) (HC CODE) with an INR goal of 2.0-3.0.  The patient was contacted regarding lab drawn INR results.  Patient's INR findings and prescribed dose from last visit:Anticoagulation Monitoring Latest Ref Rng & Units 06/22/2020 06/25/2020 07/01/2020 INR 2.0 - 3.0 - - - INR 0.91 - 1.10 - - - INR (External) 0.80 - 1.15 - - - Assoc. INR Date - 06/22/2020 06/25/2020 07/01/2020 Associated INR - 1.35 1.99 3.21 Instructions - 2.5 mg every Thu; 5 mg all other days 3/1: 7.5 mg; Otherwise 5 mg every day 3/7: 2.5 mg; Otherwise 5 mg every day Last 7 day dose total - 20 mg 35 mg 37.5 mg Next INR Date - 06/25/2020 07/01/2020 07/08/2020 The following findings were reported from today's call (no findings if none selected):[]  Missed doses []  Extra doses []  Change in medications []  Change in vitamin k rich food intake (green vegetables, herbals, etc) []  Change in alcohol use []  Change in overall health []  Recent hospitalization / emergency department visit []  Upcoming procedure (including dental procedures) []  Other Comments:       Dwan said that she had stopped taking the low dose vitamin K when she was hospitalized.  She is going to resume the vitamin K today. She states that she has also reduced her intake of vitamin K rich foods. She denies any other changes.  The following side effects were reported from today's call (no side effects if none selected):[]  Bleeding []  New bruising []  Warfarin-emergency department visit or hospital admission []  Other Comments:   No side effects Assessment/Plan:Patient's INR is 3.21, which is above goal range of 2.0-3.0.  She was instructed to take 2.5mg  tonight (07/01/20), then resume taking 5mg  warfarin daily as indicated in the maintenance plan below.  Next INR assessment due 1 week.  Provider was messaged with this dosing plan. Anticoagulation Summary  As of 07/01/2020  INR goal:  2.0-3.0 TTR:  45.9 % (6 y) INR used for dosing:  3.21 (07/01/2020) Warfarin maintenance plan:  5 mg (5 mg x 1) every day Weekly warfarin total:  35 mg Plan last modified:  [name], PharmD (06/25/2020) Next INR check:  07/08/2020 Priority:  High Priority Target end date:  Indefinite  Indications  Lupus anticoagulant with hypercoagulable state (HC Code) (HC CODE) [D68.62]Ischemic colitis (HC Code) (HC CODE) [K55.9]   Anticoagulation Episode Summary   INR check location:    Preferred lab:    Send INR reminders to:  [facility] [place] INR POOL  Comments:    Anticoagulation Care Providers   Provider Role Specialty Phone number  [name], MD Referring Medical Oncology [phone]  [place], [name], APRN Responsible Medical Oncology [phone]  All patient's medications have been reviewed and updated as needed.  Electronically Signed by Markus Daft, RPH, July 01, 2020 [facility] Anticoagulation Clinic  [address] [place], [place] [address]  Phone:[phone]  Fax: [phone]

## 2020-07-09 ENCOUNTER — Inpatient Hospital Stay: Admit: 2020-07-09 | Discharge: 2020-07-09 | Payer: PRIVATE HEALTH INSURANCE | Primary: Internal Medicine

## 2020-07-09 DIAGNOSIS — D6862 Lupus anticoagulant syndrome: Secondary | ICD-10-CM

## 2020-07-09 DIAGNOSIS — Z5181 Encounter for therapeutic drug level monitoring: Secondary | ICD-10-CM

## 2020-07-09 DIAGNOSIS — K559 Vascular disorder of intestine, unspecified: Secondary | ICD-10-CM

## 2020-07-09 DIAGNOSIS — Z7901 Long term (current) use of anticoagulants: Secondary | ICD-10-CM

## 2020-07-10 ENCOUNTER — Ambulatory Visit: Admit: 2020-07-10 | Payer: PRIVATE HEALTH INSURANCE | Attending: Pharmacotherapy | Primary: Internal Medicine

## 2020-07-10 ENCOUNTER — Encounter: Admit: 2020-07-10 | Payer: PRIVATE HEALTH INSURANCE | Attending: Pharmacotherapy | Primary: Internal Medicine

## 2020-07-10 DIAGNOSIS — K55039 Acute (reversible) ischemia of large intestine, extent unspecified: Secondary | ICD-10-CM

## 2020-07-10 DIAGNOSIS — F32A Depression: Secondary | ICD-10-CM

## 2020-07-10 DIAGNOSIS — R42 Dizziness and giddiness: Secondary | ICD-10-CM

## 2020-07-10 DIAGNOSIS — L719 Rosacea, unspecified: Secondary | ICD-10-CM

## 2020-07-10 DIAGNOSIS — D6862 Lupus anticoagulant syndrome: Secondary | ICD-10-CM

## 2020-07-10 DIAGNOSIS — E119 Type 2 diabetes mellitus without complications: Secondary | ICD-10-CM

## 2020-07-10 DIAGNOSIS — K436 Other and unspecified ventral hernia with obstruction, without gangrene: Secondary | ICD-10-CM

## 2020-07-10 DIAGNOSIS — E7800 Pure hypercholesterolemia, unspecified: Secondary | ICD-10-CM

## 2020-07-10 DIAGNOSIS — K559 Vascular disorder of intestine, unspecified: Secondary | ICD-10-CM

## 2020-07-10 LAB — PROTIME AND INR
BKR INR: 2.36 — ABNORMAL HIGH (ref 0.87–1.14)
BKR PROTHROMBIN TIME: 23.6 seconds — ABNORMAL HIGH (ref 9.6–12.3)

## 2020-07-10 NOTE — Progress Notes
Pharmacist Anticoagulation Clinic Telephone Visit Follow Maria [name] (04-Jan-1951) is on chronic anticoagulation for the diagnosis of  Lupus anticoagulant with hypercoagulable state (HC Code) (HC CODE)  (primary encounter diagnosis) Ischemic colitis (HC Code) (HC CODE) with an INR goal of 2.0-3.0.  The patient was contacted regarding lab drawn INR results.  Patient's INR findings and prescribed dose from last visit:Anticoagulation Monitoring Latest Ref Rng & Units 06/25/2020 07/01/2020 07/10/2020 INR 2.0 - 3.0 - - - INR 0.91 - 1.10 - - - INR (External) 0.80 - 1.15 - - - Assoc. INR Date - 06/25/2020 07/01/2020 07/09/2020 Associated INR - 1.99 3.21 2.36 Instructions - 3/1: 7.5 mg; Otherwise 5 mg every day 3/7: 2.5 mg; Otherwise 5 mg every day 5 mg every day Last 7 day dose total - 35 mg 37.5 mg 35 mg Next INR Date - 07/01/2020 07/08/2020 07/23/2020 The following findings were reported from today's call (no findings if none selected):[]  Missed doses []  Extra doses []  Change in medications []  Change in vitamin k rich food intake (green vegetables, herbals, etc) []  Change in alcohol use []  Change in overall health []  Recent hospitalization / emergency department visit []  Upcoming procedure (including dental procedures) []  Other Comments:       No findings.  Patient states that she has been taking low dose vitamin K daily and that her diet has been consistent.  Patient says she has seasonal allergies and may take Claritin or Allegra if needed.  No drug interactions noted between these medications and warfarin. The following side effects were reported from today's call (no side effects if none selected):[]  Bleeding []  New bruising []  Warfarin-emergency department visit or hospital admission []  Other Comments:   No side effects Assessment/Plan:Patient's INR is 2.36, which is within goal range of 2.0-3.0.  She was instructed to continue current weekly dose as indicated in the maintenance plan below.  Next INR assessment due 2 weeks.  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  Electronically Signed by Markus Daft, RPH, July 10, 2020 [facility] Anticoagulation [facility]  [address] [place], [place] [address]  Phone:[phone]  Fax: [phone]

## 2020-07-11 NOTE — Progress Notes
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  The patient was contacted regarding lab drawn INR results.  Patient's INR findings and prescribed dose from last visit:Anticoagulation Monitoring Latest Ref Rng & Units 01/30/2020 02/06/2020 03/11/2020 INR 2.0 - 3.0 - - - INR 0.91 - 1.10 - - - INR (External) 0.80 - 1.15 - - - Assoc. INR Date - 01/29/2020 02/06/2020 03/11/2020 Associated INR - 3.10 4.34 1.48 Instructions - 2.5 mg every Wed; 5 mg all other days 10/12: Hold; Otherwise 2.5 mg every Wed, Fri; 5 mg all other days 2.5 mg every Wed, Fri; 5 mg all other days Last 7 day dose total - 32.5 mg 32.5 mg 30 mg Next INR Date - 02/05/2020 02/12/2020 03/18/2020 The following findings were reported from today's call (no findings if none selected):[]  Missed doses []  Extra doses []  Change in medications []  Change in vitamin k rich food intake (green vegetables, herbals, etc) []  Change in alcohol use []  Change in overall health []  Recent hospitalization / emergency department visit [x]  Upcoming procedure (including dental procedures) []  Other Comments:       Has surgery scheduled in 04/09/20.  The following side effects were reported from today's call (no side effects if none selected):[]  Bleeding []  New bruising []  Warfarin-emergency department visit or hospital admission []  Other Comments:   Has pretty heavy vaginal bleeding when she goes to the bathroom since 03/09/20, says it's not bad enough to go to the ED. Had a D&C in the past.  Assessment/Plan:Patient's INR is 1.48, which is below goal range of 2.0-3.0.  Patient has a new vitamin K 100 mcg Nutricost (was taking [employer] vitamin K 100 mcg), which is likely the reason for low INR (Nutricost has no quality assurance). She was instructed to take warfarin 7.5 mg today only, 5 mg all other days as indicated in the maintenance plan below.  Next INR assessment due 1 week.   Plan sent to provider for review. As discussed with [place], APRN, will plan for patient to come off of warfarin for 5 full days prior to procedure, and bridge with Lovenox 1.5 mg/kg daily x 4 days prior to procedure, last dose 24 hours before procedure. 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  Electronically Signed by [name], PharmD, November 15, 2021[facility] Anticoagulation [facility]  [address] [place], [place] [address]  Phone:[phone]  Fax: [phone]

## 2020-07-15 ENCOUNTER — Encounter: Admit: 2020-07-15 | Payer: PRIVATE HEALTH INSURANCE | Attending: Medical Oncology | Primary: Internal Medicine

## 2020-07-15 MED ORDER — WARFARIN 5 MG TABLET
5 mg | ORAL_TABLET | Freq: Every day | ORAL | 3 refills | Status: AC
Start: 2020-07-15 — End: 2020-10-21

## 2020-07-15 NOTE — Telephone Encounter
Received call from patient, she needs a refill of her warfarin 5MG . Please refill to the [facility] in [place].

## 2020-07-29 ENCOUNTER — Inpatient Hospital Stay: Admit: 2020-07-29 | Discharge: 2020-07-29 | Payer: PRIVATE HEALTH INSURANCE | Primary: Internal Medicine

## 2020-07-29 DIAGNOSIS — D6862 Lupus anticoagulant syndrome: Secondary | ICD-10-CM

## 2020-07-29 DIAGNOSIS — K559 Vascular disorder of intestine, unspecified: Secondary | ICD-10-CM

## 2020-07-29 DIAGNOSIS — Z5181 Encounter for therapeutic drug level monitoring: Secondary | ICD-10-CM

## 2020-07-29 DIAGNOSIS — Z7901 Long term (current) use of anticoagulants: Secondary | ICD-10-CM

## 2020-07-30 ENCOUNTER — Encounter: Admit: 2020-07-30 | Payer: PRIVATE HEALTH INSURANCE | Attending: Pharmacotherapy | Primary: Internal Medicine

## 2020-07-30 ENCOUNTER — Ambulatory Visit: Admit: 2020-07-30 | Payer: PRIVATE HEALTH INSURANCE | Attending: Pharmacotherapy | Primary: Internal Medicine

## 2020-07-30 DIAGNOSIS — R42 Dizziness and giddiness: Secondary | ICD-10-CM

## 2020-07-30 DIAGNOSIS — L719 Rosacea, unspecified: Secondary | ICD-10-CM

## 2020-07-30 DIAGNOSIS — K55039 Acute (reversible) ischemia of large intestine, extent unspecified: Secondary | ICD-10-CM

## 2020-07-30 DIAGNOSIS — E7800 Pure hypercholesterolemia, unspecified: Secondary | ICD-10-CM

## 2020-07-30 DIAGNOSIS — D6862 Lupus anticoagulant syndrome: Secondary | ICD-10-CM

## 2020-07-30 DIAGNOSIS — K436 Other and unspecified ventral hernia with obstruction, without gangrene: Secondary | ICD-10-CM

## 2020-07-30 DIAGNOSIS — K559 Vascular disorder of intestine, unspecified: Secondary | ICD-10-CM

## 2020-07-30 DIAGNOSIS — F32A Depression: Secondary | ICD-10-CM

## 2020-07-30 DIAGNOSIS — E119 Type 2 diabetes mellitus without complications: Secondary | ICD-10-CM

## 2020-07-30 LAB — PROTIME AND INR
BKR INR: 1.91 — ABNORMAL HIGH (ref 0.87–1.14)
BKR PROTHROMBIN TIME: 19.5 seconds — ABNORMAL HIGH (ref 9.6–12.3)

## 2020-07-31 ENCOUNTER — Telehealth: Admit: 2020-07-31 | Payer: PRIVATE HEALTH INSURANCE | Attending: Medical Oncology | Primary: Internal Medicine

## 2020-07-31 NOTE — Telephone Encounter
Received call from patient, she would like a call back from Nadya because call patient back at [phone].

## 2020-07-31 NOTE — Progress Notes
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  The patient was contacted regarding lab drawn INR results.  Patient's INR findings and prescribed dose from last visit:Anticoagulation Monitoring Latest Ref Rng & Units 07/01/2020 07/10/2020 07/30/2020 INR 2.0 - 3.0 - - - INR 0.91 - 1.10 - - - INR (External) 0.80 - 1.15 - - - Assoc. INR Date - 07/01/2020 07/09/2020 07/29/2020 Associated INR - 3.21 2.36 1.91 Instructions - 3/7: 2.5 mg; Otherwise 5 mg every day 5 mg every day 5 mg every day Last 7 day dose total - 37.5 mg 35 mg 35 mg Next INR Date - 07/08/2020 07/23/2020 08/05/2020 The following findings were reported from today's call (no findings if none selected):[]  Missed doses []  Extra doses []  Change in medications []  Change in vitamin k rich food intake (green vegetables, herbals, etc) []  Change in alcohol use []  Change in overall health []  Recent hospitalization / emergency department visit []  Upcoming procedure (including dental procedures) []  Other Comments:       Unable to assess. The following side effects were reported from today's call (no side effects if none selected):[]  Bleeding []  New bruising []  Warfarin-emergency department visit or hospital admission []  Other Comments:   Unable to assess.  Assessment/Plan:Patient's INR is 1.91, which is below goal range of 2.0-3.0. Patient's chart was reviewed and voice message was left instructing patient to call the clinic if there have been any changes to prescription or over the counter medications or diet, if experiencing a current illness, if there are any planned upcoming invasive/dental procedures or any other changes that could affect warfarin therapy.  She was instructed to continue current weekly dose as indicated in the maintenance plan below.  Next INR assessment due 1 week.  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  Electronically Signed by Markus Daft, RPH, July 30, 2020 [facility] Anticoagulation Clinic  [address] [place], [place] [address]  [phone]  Fax: [phone]

## 2020-07-31 NOTE — Telephone Encounter
Returned patient's call and left voice message with dosing instructions, please see anticoag encounter from 4/6. [name], PharmD, BCPSClinical Pharmacist II, Anticoagulation ClinicSm[facility]

## 2020-07-31 NOTE — Progress Notes
Left voice message for patient again with her dosing instructions for the next week.

## 2020-08-01 ENCOUNTER — Telehealth: Admit: 2020-08-01 | Payer: PRIVATE HEALTH INSURANCE | Attending: Medical | Primary: Internal Medicine

## 2020-08-01 NOTE — Telephone Encounter
Received call from patient, she would like a call back from [facility], please call patient back at [phone].

## 2020-08-02 ENCOUNTER — Telehealth: Admit: 2020-08-02 | Payer: PRIVATE HEALTH INSURANCE | Attending: Medical Oncology | Primary: Internal Medicine

## 2020-08-02 NOTE — Telephone Encounter
Rec'd call from [place] she is trying to get in touch with nadya, & patient is having trouble with her Phone. Please call this phone number it is her husbands [phone]

## 2020-08-05 ENCOUNTER — Inpatient Hospital Stay: Admit: 2020-08-05 | Discharge: 2020-08-05 | Payer: PRIVATE HEALTH INSURANCE | Primary: Internal Medicine

## 2020-08-05 DIAGNOSIS — K559 Vascular disorder of intestine, unspecified: Secondary | ICD-10-CM

## 2020-08-05 DIAGNOSIS — D6862 Lupus anticoagulant syndrome: Secondary | ICD-10-CM

## 2020-08-05 DIAGNOSIS — Z5181 Encounter for therapeutic drug level monitoring: Secondary | ICD-10-CM

## 2020-08-05 DIAGNOSIS — Z7901 Long term (current) use of anticoagulants: Secondary | ICD-10-CM

## 2020-08-05 LAB — PROTIME AND INR
BKR INR: 1.9 — ABNORMAL HIGH (ref 0.91–1.05)
BKR PROTHROMBIN TIME: 19.7 seconds — ABNORMAL HIGH (ref 10.0–11.6)

## 2020-08-06 ENCOUNTER — Ambulatory Visit
Admit: 2020-08-06 | Payer: PRIVATE HEALTH INSURANCE | Attending: Pharmacist Clinician (PhC)/ Clinical Pharmacy Specialist | Primary: Internal Medicine

## 2020-08-06 DIAGNOSIS — K559 Vascular disorder of intestine, unspecified: Secondary | ICD-10-CM

## 2020-08-06 DIAGNOSIS — D6862 Lupus anticoagulant syndrome: Secondary | ICD-10-CM

## 2020-08-06 NOTE — Progress Notes
Pharmacist Anticoagulation Clinic Telephone Visit Follow Maria [name] (07-07-1950) is on chronic anticoagulation for the diagnosis of  Lupus anticoagulant with hypercoagulable state (HC Code) (HC CODE) (HC Code)  (primary encounter diagnosis) Ischemic colitis (HC Code) with an INR goal of 2.0-3.0.  The patient was contacted regarding lab drawn INR results.  Patient's INR findings and prescribed dose from last visit:Anticoagulation Monitoring Latest Ref Rng & Units 07/10/2020 07/30/2020 08/06/2020 INR 2.0 - 3.0 - - - INR 0.91 - 1.10 - - - INR (External) 0.80 - 1.15 - - - Assoc. INR Date - 07/09/2020 07/29/2020 08/05/2020 Associated INR - 2.36 1.91 1.90 Instructions - 5 mg every day 5 mg every day 7.5 mg every Tue; 5 mg all other days Last 7 day dose total - 35 mg 35 mg 35 mg Next INR Date - 07/23/2020 08/05/2020 08/19/2020 The following findings were reported from today's call (no findings if none selected):[]  Missed doses []  Extra doses []  Change in medications []  Change in vitamin k rich food intake (green vegetables, herbals, etc) []  Change in alcohol use []  Change in overall health []  Recent hospitalization / emergency department visit []  Upcoming procedure (including dental procedures) []  Other Comments:       No findings The following side effects were reported from today's call (no side effects if none selected):[]  Bleeding []  New bruising []  Warfarin-emergency department visit or hospital admission []  Other Comments:   No side effects Assessment/Plan:Patient's INR is 1.90, which is below goal range of 2.0-3.0. Eats avocado 1-2x/week, small amount of broccoli. She was instructed to increase weekly dose by 7% as indicated in the maintenance plan below.  Next INR assessment due 2 weeks.  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  Electronically Signed by [name], PharmD, August 06, 2020 [facility] Anticoagulation Clinic  [address] [place], [place] [address]  [phone]  Fax: [phone]

## 2020-08-14 ENCOUNTER — Encounter: Admit: 2020-08-14 | Payer: PRIVATE HEALTH INSURANCE | Attending: Surgery of the Hand | Primary: Internal Medicine

## 2020-08-16 ENCOUNTER — Encounter: Admit: 2020-08-16 | Payer: PRIVATE HEALTH INSURANCE | Attending: Medical Oncology | Primary: Internal Medicine

## 2020-08-16 DIAGNOSIS — D6862 Lupus anticoagulant syndrome: Secondary | ICD-10-CM

## 2020-08-20 ENCOUNTER — Ambulatory Visit
Admit: 2020-08-20 | Payer: PRIVATE HEALTH INSURANCE | Attending: Pharmacist Clinician (PhC)/ Clinical Pharmacy Specialist | Primary: Internal Medicine

## 2020-08-20 ENCOUNTER — Inpatient Hospital Stay: Admit: 2020-08-20 | Discharge: 2020-08-20 | Payer: PRIVATE HEALTH INSURANCE | Primary: Internal Medicine

## 2020-08-20 DIAGNOSIS — Z7901 Long term (current) use of anticoagulants: Secondary | ICD-10-CM

## 2020-08-20 DIAGNOSIS — D6862 Lupus anticoagulant syndrome: Secondary | ICD-10-CM

## 2020-08-20 DIAGNOSIS — K559 Vascular disorder of intestine, unspecified: Secondary | ICD-10-CM

## 2020-08-20 DIAGNOSIS — Z5181 Encounter for therapeutic drug level monitoring: Secondary | ICD-10-CM

## 2020-08-20 LAB — PROTIME AND INR
BKR INR: 1.61 — ABNORMAL HIGH (ref 0.91–1.05)
BKR PROTHROMBIN TIME: 16.8 seconds — ABNORMAL HIGH (ref 10.0–11.6)

## 2020-08-20 NOTE — Progress Notes
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  The patient was contacted regarding lab drawn INR results.  Patient's INR findings and prescribed dose from last visit:Anticoagulation Monitoring Latest Ref Rng & Units 07/30/2020 08/06/2020 08/20/2020 INR 2.0 - 3.0 - - - INR 0.91 - 1.10 - - - INR (External) 0.80 - 1.15 - - - Assoc. INR Date - 07/29/2020 08/05/2020 08/20/2020 Associated INR - 1.91 1.90 1.61 Instructions - 5 mg every day 7.5 mg every Tue; 5 mg all other days 7.5 mg every Tue, Fri; 5 mg all other days Last 7 day dose total - 35 mg 35 mg 37.5 mg Next INR Date - 08/05/2020 08/19/2020 08/27/2020 The following findings were reported from today's call (no findings if none selected):[]  Missed doses []  Extra doses []  Change in medications [x]  Change in vitamin k rich food intake (green vegetables, herbals, etc) []  Change in alcohol use []  Change in overall health []  Recent hospitalization / emergency department visit []  Upcoming procedure (including dental procedures) []  Other Comments:       Started drinking SlimFast shakes every other day with vitamin K 20 mcg/bottle The following side effects were reported from today's call (no side effects if none selected):[]  Bleeding []  New bruising []  Warfarin-emergency department visit or hospital admission []  Other Comments:   No side effects Assessment/Plan:Patient's INR is 1.61, which is below goal range of 2.0-3.0.  Patient eats 1/3 avocado per week (not a lot of vit K in this small amount). Started drinking SlimFast shakes every other day with vitamin K 20 mcg/bottle - this is the reason for the low INR. She was instructed to increase weekly dose by 15% (because she has been subtherapeutic for ~ 4 weeks) to 7.5 mg twice weekly, 5 mg all other days as indicated in the maintenance plan below.  Next INR assessment due 1 week. 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  Electronically Signed by [name], PharmD, August 20, 2020 [facility] Anticoagulation Clinic  [address], [place], [place] [address]  Phone:[phone]  Fax: [phone]

## 2020-08-27 ENCOUNTER — Inpatient Hospital Stay: Admit: 2020-08-27 | Discharge: 2020-08-27 | Payer: PRIVATE HEALTH INSURANCE | Primary: Internal Medicine

## 2020-08-27 DIAGNOSIS — K559 Vascular disorder of intestine, unspecified: Secondary | ICD-10-CM

## 2020-08-27 DIAGNOSIS — Z5181 Encounter for therapeutic drug level monitoring: Secondary | ICD-10-CM

## 2020-08-27 DIAGNOSIS — D6862 Lupus anticoagulant syndrome: Secondary | ICD-10-CM

## 2020-08-27 DIAGNOSIS — Z7901 Long term (current) use of anticoagulants: Secondary | ICD-10-CM

## 2020-08-27 LAB — PROTIME AND INR
BKR INR: 2.77 — ABNORMAL HIGH (ref 0.91–1.05)
BKR PROTHROMBIN TIME: 28 seconds — ABNORMAL HIGH (ref 10.0–11.6)

## 2020-08-28 ENCOUNTER — Ambulatory Visit: Admit: 2020-08-28 | Payer: PRIVATE HEALTH INSURANCE | Attending: Pharmacotherapy | Primary: Internal Medicine

## 2020-08-28 ENCOUNTER — Encounter: Admit: 2020-08-28 | Payer: PRIVATE HEALTH INSURANCE | Attending: Pharmacotherapy | Primary: Internal Medicine

## 2020-08-28 DIAGNOSIS — E119 Type 2 diabetes mellitus without complications: Secondary | ICD-10-CM

## 2020-08-28 DIAGNOSIS — K436 Other and unspecified ventral hernia with obstruction, without gangrene: Secondary | ICD-10-CM

## 2020-08-28 DIAGNOSIS — K55039 Acute (reversible) ischemia of large intestine, extent unspecified: Secondary | ICD-10-CM

## 2020-08-28 DIAGNOSIS — D6862 Lupus anticoagulant syndrome: Secondary | ICD-10-CM

## 2020-08-28 DIAGNOSIS — K559 Vascular disorder of intestine, unspecified: Secondary | ICD-10-CM

## 2020-08-28 DIAGNOSIS — F32A Depression: Secondary | ICD-10-CM

## 2020-08-28 DIAGNOSIS — E7800 Pure hypercholesterolemia, unspecified: Secondary | ICD-10-CM

## 2020-08-28 DIAGNOSIS — R42 Dizziness and giddiness: Secondary | ICD-10-CM

## 2020-08-28 DIAGNOSIS — L719 Rosacea, unspecified: Secondary | ICD-10-CM

## 2020-08-28 NOTE — Progress Notes
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  The patient was contacted regarding lab drawn INR results.  Patient's INR findings and prescribed dose from last visit:Anticoagulation Monitoring Latest Ref Rng & Units 08/06/2020 08/20/2020 08/28/2020 INR 2.0 - 3.0 - - - INR 0.91 - 1.10 - - - INR (External) 0.80 - 1.15 - - - Assoc. INR Date - 08/05/2020 08/20/2020 08/27/2020 Associated INR - 1.90 1.61 2.77 Instructions - 7.5 mg every Tue; 5 mg all other days 7.5 mg every Tue, Fri; 5 mg all other days 7.5 mg every Tue, Fri; 5 mg all other days Last 7 day dose total - 35 mg 35 mg 40 mg Next INR Date - 08/19/2020 08/28/2020 09/10/2020 The following findings were reported from today's call (no findings if none selected):[]  Missed doses []  Extra doses []  Change in medications []  Change in vitamin k rich food intake (green vegetables, herbals, etc) []  Change in alcohol use []  Change in overall health []  Recent hospitalization / emergency department visit []  Upcoming procedure (including dental procedures) []  Other Comments:       Unable to assess. LVM. The following side effects were reported from today's call (no side effects if none selected):[]  Bleeding []  New bruising []  Warfarin-emergency department visit or hospital admission []  Other Comments:   Unable to assess. LVM. Assessment/Plan:Patient's INR is 2.77, which is within goal range of 2.0-3.0. Patient's chart was reviewed and voice message was left instructing patient to call the clinic if there have been any changes to prescription or over the counter medications or diet, if experiencing a current illness, if there are any planned upcoming invasive/dental procedures or any other changes that could affect warfarin therapy.  She was instructed to continue current weekly dose as indicated in the maintenance plan below.  Next INR assessment due 2 weeks.  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  Electronically Signed by Markus Daft, RPH, Aug 28, 2020 [facility] Anticoagulation Clinic  [address] [place], [place] [address]  [phone]  Fax: [phone]

## 2020-09-10 ENCOUNTER — Inpatient Hospital Stay: Admit: 2020-09-10 | Discharge: 2020-09-10 | Payer: PRIVATE HEALTH INSURANCE | Primary: Internal Medicine

## 2020-09-10 DIAGNOSIS — D6862 Lupus anticoagulant syndrome: Secondary | ICD-10-CM

## 2020-09-10 DIAGNOSIS — K559 Vascular disorder of intestine, unspecified: Secondary | ICD-10-CM

## 2020-09-10 DIAGNOSIS — Z7901 Long term (current) use of anticoagulants: Secondary | ICD-10-CM

## 2020-09-10 DIAGNOSIS — Z5181 Encounter for therapeutic drug level monitoring: Secondary | ICD-10-CM

## 2020-09-10 LAB — PROTIME AND INR
BKR INR: 1.89 — ABNORMAL HIGH (ref 0.91–1.05)
BKR PROTHROMBIN TIME: 19.5 seconds — ABNORMAL HIGH (ref 10.0–11.6)

## 2020-09-11 ENCOUNTER — Ambulatory Visit: Admit: 2020-09-11 | Payer: PRIVATE HEALTH INSURANCE | Attending: Pharmacotherapy | Primary: Internal Medicine

## 2020-09-11 ENCOUNTER — Encounter: Admit: 2020-09-11 | Payer: PRIVATE HEALTH INSURANCE | Attending: Pharmacotherapy | Primary: Internal Medicine

## 2020-09-11 DIAGNOSIS — E119 Type 2 diabetes mellitus without complications: Secondary | ICD-10-CM

## 2020-09-11 DIAGNOSIS — F32A Depression: Secondary | ICD-10-CM

## 2020-09-11 DIAGNOSIS — L719 Rosacea, unspecified: Secondary | ICD-10-CM

## 2020-09-11 DIAGNOSIS — D6862 Lupus anticoagulant syndrome: Secondary | ICD-10-CM

## 2020-09-11 DIAGNOSIS — E7800 Pure hypercholesterolemia, unspecified: Secondary | ICD-10-CM

## 2020-09-11 DIAGNOSIS — R42 Dizziness and giddiness: Secondary | ICD-10-CM

## 2020-09-11 DIAGNOSIS — K436 Other and unspecified ventral hernia with obstruction, without gangrene: Secondary | ICD-10-CM

## 2020-09-11 DIAGNOSIS — K55039 Acute (reversible) ischemia of large intestine, extent unspecified: Secondary | ICD-10-CM

## 2020-09-11 DIAGNOSIS — K559 Vascular disorder of intestine, unspecified: Secondary | ICD-10-CM

## 2020-09-11 NOTE — Progress Notes
Pharmacist Anticoagulation Clinic Telephone Visit Follow Shannia Jacuinde (01-Apr-1951) is on chronic anticoagulation for the diagnosis of  Lupus anticoagulant with hypercoagulable state (HC Code) (HC CODE) (HC Code)  (primary encounter diagnosis) Ischemic colitis (HC Code) with an INR goal of 2.0-3.0.  The patient and patient's husband Heather Roberts) was contacted regarding lab drawn INR results.  Patient's INR findings and prescribed dose from last visit:Anticoagulation Monitoring Latest Ref Rng & Units 08/20/2020 08/28/2020 09/11/2020 INR 2 - 3 - - - INR 0.91 - 1.10 - - - INR (External) 0.8 - 1.15 - - - Assoc. INR Date - 08/20/2020 08/27/2020 09/10/2020 Associated INR - 1.61 2.77 1.89 Instructions - 7.5 mg every Tue, Fri; 5 mg all other days 7.5 mg every Tue, Fri; 5 mg all other days 7.5 mg every Tue, Fri; 5 mg all other days Last 7 day dose total - 35 mg 40 mg 40 mg Next INR Date - 08/28/2020 09/10/2020 09/17/2020 The following findings were reported from today's call (no findings if none selected):[]  Missed doses []  Extra doses []  Change in medications []  Change in vitamin k rich food intake (green vegetables, herbals, etc) []  Change in alcohol use []  Change in overall health []  Recent hospitalization / emergency department visit []  Upcoming procedure (including dental procedures) []  Other Comments:       Unable to assess.  The following side effects were reported from today's call (no side effects if none selected):[]  Bleeding []  New bruising []  Warfarin-emergency department visit or hospital admission []  Other Comments:   Unable to assess.  Assessment/Plan:Patient's INR is 1.89, which is below goal range of 2.0-3.0. Patient's chart was reviewed and voice message was left instructing patient to call the clinic if there have been any changes to prescription or over the counter medications or diet, if experiencing a current illness, if there are any planned upcoming invasive/dental procedures or any other changes that could affect warfarin therapy.  She (via voice message to [facility]) was instructed to continue current weekly dose as indicated in the maintenance plan below.  Next INR assessment due 1 week.  Will follow-up to confirm that [place] received dosing instructions. 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  Electronically Signed by Markus Daft, RPH, Sep 11, 2020 [facility]  [address] [place], [place] [address]  [phone]  Fax: [phone]

## 2020-09-17 ENCOUNTER — Telehealth
Admit: 2020-09-17 | Payer: PRIVATE HEALTH INSURANCE | Attending: Pharmacist Clinician (PhC)/ Clinical Pharmacy Specialist | Primary: Internal Medicine

## 2020-09-17 NOTE — Telephone Encounter
Called patient to confirm she received our clinic voice message from last week, she did not pick up. INR due today. [name], PharmD, BCPSClinical Pharmacist II, Anticoagulation ClinicSmilow [facility]

## 2020-09-18 ENCOUNTER — Inpatient Hospital Stay: Admit: 2020-09-18 | Discharge: 2020-09-18 | Payer: PRIVATE HEALTH INSURANCE | Primary: Internal Medicine

## 2020-09-18 ENCOUNTER — Ambulatory Visit: Admit: 2020-09-18 | Payer: PRIVATE HEALTH INSURANCE | Attending: Pharmacotherapy | Primary: Internal Medicine

## 2020-09-18 DIAGNOSIS — Z7901 Long term (current) use of anticoagulants: Secondary | ICD-10-CM

## 2020-09-18 DIAGNOSIS — K559 Vascular disorder of intestine, unspecified: Secondary | ICD-10-CM

## 2020-09-18 DIAGNOSIS — D6862 Lupus anticoagulant syndrome: Secondary | ICD-10-CM

## 2020-09-18 DIAGNOSIS — Z5181 Encounter for therapeutic drug level monitoring: Secondary | ICD-10-CM

## 2020-09-18 LAB — PROTIME AND INR
BKR INR: 1.84 — ABNORMAL HIGH (ref 0.91–1.05)
BKR PROTHROMBIN TIME: 19.1 seconds — ABNORMAL HIGH (ref 10.0–11.6)

## 2020-09-18 NOTE — Progress Notes
Pharmacist Anticoagulation Clinic Telephone Visit Follow Maria [name] (08-May-1950) is on chronic anticoagulation for the diagnosis of  Lupus anticoagulant with hypercoagulable state (HC Code) (HC CODE) (HC Code)  (primary encounter diagnosis) Ischemic colitis (HC Code) with an INR goal of 2.0-3.0.  The patient was contacted regarding lab drawn INR results.  Patient's INR findings and prescribed dose from last visit:Anticoagulation Monitoring Latest Ref Rng & Units 08/28/2020 09/11/2020 09/18/2020 INR 2 - 3 - - - INR 0.91 - 1.10 - - - INR (External) 0.8 - 1.15 - - - Assoc. INR Date - 08/27/2020 09/10/2020 09/18/2020 Associated INR - 2.77 1.89 1.84 Instructions - 7.5 mg every Tue, Fri; 5 mg all other days 7.5 mg every Tue, Fri; 5 mg all other days 7.5 mg every Sun, Tue, Fri; 5 mg all other days Last 7 day dose total - 40 mg 40 mg 40 mg Next INR Date - 09/10/2020 09/17/2020 09/25/2020 The following findings were reported from today's call (no findings if none selected):[]  Missed doses []  Extra doses []  Change in medications []  Change in vitamin k rich food intake (green vegetables, herbals, etc) []  Change in alcohol use []  Change in overall health []  Recent hospitalization / emergency department visit []  Upcoming procedure (including dental procedures) []  Other Comments:       No findings The following side effects were reported from today's call (no side effects if none selected):[]  Bleeding []  New bruising []  Warfarin-emergency department visit or hospital admission []  Other Comments:   No side effects Assessment/Plan:Patient's INR is 1.84, which is below goal range of 2.0-3.0.  She was instructed to increase weekly dose about 6% to 7.5mg  Tuesday, Friday, Sunday and 5mg  all other days as indicated in the maintenance plan below.  Next INR assessment due 1 week.  Provider was messaged with this dosing plan.Anticoagulation Summary  As of 09/18/2020 INR goal:  2.0-3.0 TTR:  45.8 % (6.3 y) INR used for dosing:  1.84 (09/18/2020) Warfarin maintenance plan:  7.5 mg (5 mg x 1.5) every Sun, Tue, Fri; 5 mg (5 mg x 1) all other days Weekly warfarin total:  42.5 mg Plan last modified:  Markus Daft, [facility] (09/18/2020) Next INR check:  09/25/2020 Priority:  High Priority Target end date:  Indefinite  Indications  Lupus anticoagulant with hypercoagulable state (HC Code) (HC CODE) (HC Code) [D68.62]Ischemic colitis (HC Code) [K55.9]   Anticoagulation Episode Summary   INR check location:    Preferred lab:    Send INR reminders to:  [facility] [place] INR POOL  Comments:    Anticoagulation Care Providers   Provider Role Specialty Phone number  [name], MD Referring Medical Oncology [phone]  [place], [name], APRN Responsible Medical Oncology [phone]  All patient's medications have been reviewed and updated as needed.  Electronically Signed by Markus Daft, RPH, Sep 18, 2020 [facility]  [address] [place], [place] [address]  [phone]  Fax: [phone]

## 2020-09-30 ENCOUNTER — Inpatient Hospital Stay
Admit: 2020-09-30 | Discharge: 2020-10-02 | Payer: PRIVATE HEALTH INSURANCE | Source: Home / Self Care | Attending: Surgery | Admitting: Surgery

## 2020-09-30 ENCOUNTER — Emergency Department: Admit: 2020-09-30 | Payer: PRIVATE HEALTH INSURANCE | Primary: Internal Medicine

## 2020-09-30 DIAGNOSIS — K56609 Unspecified intestinal obstruction, unspecified as to partial versus complete obstruction: Secondary | ICD-10-CM

## 2020-09-30 LAB — HEPATIC FUNCTION PANEL
BKR A/G RATIO: 1.2 (ref 1.0–2.2)
BKR ALANINE AMINOTRANSFERASE (ALT): 19 U/L (ref 10–35)
BKR ALBUMIN: 4.4 g/dL (ref 3.6–4.9)
BKR ALKALINE PHOSPHATASE: 148 U/L — ABNORMAL HIGH (ref 9–122)
BKR ASPARTATE AMINOTRANSFERASE (AST): 25 U/L (ref 10–35)
BKR AST/ALT RATIO: 1.3
BKR BILIRUBIN DIRECT: <0.2 mg/dL (ref <=0.3)
BKR BILIRUBIN TOTAL: 0.3 mg/dL (ref <=1.2)
BKR GLOBULIN: 3.6 g/dL — ABNORMAL HIGH (ref 2.3–3.5)
BKR PROTEIN TOTAL: 8 g/dL (ref 6.6–8.7)

## 2020-09-30 LAB — URINALYSIS WITH CULTURE REFLEX      (BH LMW YH)
BKR BILIRUBIN, UA: NEGATIVE
BKR BLOOD, UA: NEGATIVE
BKR LEUKOCYTE ESTERASE, UA: NEGATIVE
BKR NITRITE, UA: NEGATIVE
BKR PH, UA: 7.5 (ref 5.5–7.5)
BKR SPECIFIC GRAVITY, UA: 1.037 — ABNORMAL HIGH (ref 1.005–1.030)
BKR UROBILINOGEN, UA: <2 EU/dL (ref <=2.0)

## 2020-09-30 LAB — CBC WITH AUTO DIFFERENTIAL
BKR WAM ABSOLUTE IMMATURE GRANULOCYTES.: 0.04 x 1000/ÂµL (ref 0.00–0.30)
BKR WAM ABSOLUTE LYMPHOCYTE COUNT.: 1.71 x 1000/ÂµL (ref 0.60–3.70)
BKR WAM ABSOLUTE NRBC (2 DEC): 0 x 1000/ÂµL (ref 0.00–1.00)
BKR WAM ANALYZER ANC: 11.45 x 1000/ÂµL — ABNORMAL HIGH (ref 2.00–7.60)
BKR WAM BASOPHIL ABSOLUTE COUNT.: 0.06 x 1000/ÂµL (ref 0.00–1.00)
BKR WAM BASOPHILS: 0.4 % (ref 0.0–1.4)
BKR WAM EOSINOPHIL ABSOLUTE COUNT.: 0.19 x 1000/ÂµL (ref 0.00–1.00)
BKR WAM EOSINOPHILS: 1.3 % (ref 0.0–5.0)
BKR WAM HEMATOCRIT (2 DEC): 44.3 % (ref 35.00–45.00)
BKR WAM HEMOGLOBIN: 14.3 g/dL (ref 11.7–15.5)
BKR WAM IMMATURE GRANULOCYTES: 0.3 % (ref 0.0–1.0)
BKR WAM LYMPHOCYTES: 12 % — ABNORMAL LOW (ref 17.0–50.0)
BKR WAM MCH (PG): 26.1 pg — ABNORMAL LOW (ref 27.0–33.0)
BKR WAM MCHC: 32.3 g/dL (ref 31.0–36.0)
BKR WAM MCV: 81 fL (ref 80.0–100.0)
BKR WAM MONOCYTE ABSOLUTE COUNT.: 0.81 x 1000/ÂµL (ref 0.00–1.00)
BKR WAM MONOCYTES: 5.7 % (ref 4.0–12.0)
BKR WAM MPV: 11.3 fL (ref 8.0–12.0)
BKR WAM NEUTROPHILS: 80.3 % — ABNORMAL HIGH (ref 39.0–72.0)
BKR WAM NUCLEATED RED BLOOD CELLS: 0 % (ref 0.0–1.0)
BKR WAM PLATELETS: 419 x1000/ÂµL (ref 150–420)
BKR WAM RDW-CV: 15.4 % — ABNORMAL HIGH (ref 11.0–15.0)
BKR WAM RED BLOOD CELL COUNT.: 5.47 M/ÂµL (ref 4.00–6.00)
BKR WAM WHITE BLOOD CELL COUNT: 14.3 x1000/ÂµL — ABNORMAL HIGH (ref 4.0–11.0)

## 2020-09-30 LAB — BASIC METABOLIC PANEL
BKR ANION GAP: 11 (ref 7–17)
BKR BLOOD UREA NITROGEN: 18 mg/dL (ref 8–23)
BKR BUN / CREAT RATIO: 20.2 (ref 8.0–23.0)
BKR CALCIUM: 9.9 mg/dL (ref 8.8–10.2)
BKR CHLORIDE: 98 mmol/L (ref 98–107)
BKR CO2: 29 mmol/L (ref 20–30)
BKR CREATININE: 0.89 mg/dL (ref 0.40–1.30)
BKR EGFR (AFR AMER): >60 mL/min/{1.73_m2} (ref >60)
BKR EGFR (NON AFRICAN AMERICAN): >60 mL/min/{1.73_m2} (ref >60)
BKR GLUCOSE: 244 mg/dL — ABNORMAL HIGH (ref 70–100)
BKR POTASSIUM: 4.4 mmol/L (ref 3.3–5.3)
BKR SODIUM: 138 mmol/L (ref 136–144)

## 2020-09-30 LAB — PT/INR AND PTT (BH GH L LMW YH)
BKR INR: 3.04 — ABNORMAL HIGH (ref 0.87–1.14)
BKR PARTIAL THROMBOPLASTIN TIME: 54.1 s — ABNORMAL HIGH (ref 23.0–31.4)
BKR PROTHROMBIN TIME: 29.7 s — ABNORMAL HIGH (ref 9.6–12.3)

## 2020-09-30 LAB — SARS COV-2 (COVID-19) RNA: BKR SARS-COV-2 RNA (COVID-19) (YH): NEGATIVE

## 2020-09-30 LAB — LIPASE: BKR LIPASE: 30 U/L (ref 11–55)

## 2020-09-30 LAB — UA REFLEX CULTURE

## 2020-09-30 MED ORDER — HYDROMORPHONE 1 MG/ML INJECTION SYRINGE
1 mg/mL | INTRAVENOUS | Status: DC | PRN
Start: 2020-09-30 — End: 2020-10-03

## 2020-09-30 MED ORDER — WARFARIN 2.5 MG TABLET
2.5 mg | Freq: Every day | ORAL | Status: DC
Start: 2020-09-30 — End: 2020-09-30

## 2020-09-30 MED ORDER — MORPHINE 4 MG/ML INTRAVENOUS SOLUTION
4 mg/mL | Freq: Once | INTRAVENOUS | Status: CP
Start: 2020-09-30 — End: ?
  Administered 2020-09-30: 05:00:00 4 mL via INTRAVENOUS

## 2020-09-30 MED ORDER — SODIUM CHLORIDE 0.9 % LARGE VOLUME SYRINGE FOR AUTOINJECTOR
Freq: Once | INTRAVENOUS | Status: CP | PRN
Start: 2020-09-30 — End: ?
  Administered 2020-09-30: 09:00:00 via INTRAVENOUS

## 2020-09-30 MED ORDER — FRUIT JUICE
ORAL | Status: DC | PRN
Start: 2020-09-30 — End: 2020-10-03

## 2020-09-30 MED ORDER — MORPHINE 2 MG/ML INJECTION SYRINGE
2 mg/mL | INTRAVENOUS | Status: DC | PRN
Start: 2020-09-30 — End: 2020-10-01
  Administered 2020-09-30 (×2): 2 mL via INTRAVENOUS

## 2020-09-30 MED ORDER — WARFARIN THERAPY PLACEHOLDER
ORAL | Status: DC
Start: 2020-09-30 — End: 2020-10-03

## 2020-09-30 MED ORDER — DEXTROSE 5 % AND 0.45 % SODIUM CHLORIDE INTRAVENOUS SOLUTION
INTRAVENOUS | Status: DC
Start: 2020-09-30 — End: 2020-10-01
  Administered 2020-09-30 – 2020-10-01 (×3): 1000.000 mL/h via INTRAVENOUS

## 2020-09-30 MED ORDER — MORPHINE 4 MG/ML INTRAVENOUS SOLUTION
4 mg/mL | Freq: Once | INTRAVENOUS | Status: CP
Start: 2020-09-30 — End: ?
  Administered 2020-09-30: 06:00:00 4 mL via INTRAVENOUS

## 2020-09-30 MED ORDER — ACETAMINOPHEN 325 MG TABLET
325 mg | Freq: Four times a day (QID) | ORAL | Status: DC
Start: 2020-09-30 — End: 2020-10-01
  Administered 2020-09-30 – 2020-10-01 (×4): 325 mg via ORAL

## 2020-09-30 MED ORDER — SODIUM CHLORIDE 0.9 % (FLUSH) INJECTION SYRINGE
0.9 % | INTRAVENOUS | Status: DC | PRN
Start: 2020-09-30 — End: 2020-10-03

## 2020-09-30 MED ORDER — IOHEXOL 350 MG IODINE/ML INTRAVENOUS SOLUTION
350 mg iodine/mL | Freq: Once | INTRAVENOUS | Status: CP | PRN
Start: 2020-09-30 — End: ?
  Administered 2020-09-30: 09:00:00 350 mL via INTRAVENOUS

## 2020-09-30 MED ORDER — DIPHENHYDRAMINE 25 MG CAPSULE
25 mg | Freq: Once | ORAL | Status: CP
Start: 2020-09-30 — End: ?
  Administered 2020-09-30: 05:00:00 25 mg via ORAL

## 2020-09-30 MED ORDER — MORPHINE 4 MG/ML INTRAVENOUS SOLUTION
4 mg/mL | INTRAVENOUS | Status: DC | PRN
Start: 2020-09-30 — End: 2020-10-01

## 2020-09-30 MED ORDER — SODIUM CHLORIDE 0.9 % (FLUSH) INJECTION SYRINGE
0.9 % | Freq: Three times a day (TID) | INTRAVENOUS | Status: DC
Start: 2020-09-30 — End: 2020-10-03

## 2020-09-30 MED ORDER — WARFARIN 2.5 MG TABLET
2.5 mg | ORAL | Status: DC
Start: 2020-09-30 — End: 2020-10-03
  Administered 2020-09-30 – 2020-10-02 (×2): 2.5 mg via ORAL

## 2020-09-30 MED ORDER — DEXTROSE 10 % IV BOLUS FOR ORDERABLE
INTRAVENOUS | Status: DC | PRN
Start: 2020-09-30 — End: 2020-10-03

## 2020-09-30 MED ORDER — SKIM MILK
ORAL | Status: DC | PRN
Start: 2020-09-30 — End: 2020-10-03

## 2020-09-30 MED ORDER — INSULIN U-100 REGULAR HUMAN 100 UNIT/ML (SLIDING SCALE)
100 unit/mL | Freq: Four times a day (QID) | SUBCUTANEOUS | Status: DC
Start: 2020-09-30 — End: 2020-10-03
  Administered 2020-09-30 – 2020-10-02 (×6): 100 unit/mL via SUBCUTANEOUS

## 2020-09-30 MED ORDER — DEXTROSE 40 % ORAL GEL
40 % | ORAL | Status: DC | PRN
Start: 2020-09-30 — End: 2020-10-03

## 2020-09-30 MED ORDER — GLUCAGON 1 MG/ML IN STERILE WATER
Freq: Once | INTRAMUSCULAR | Status: DC | PRN
Start: 2020-09-30 — End: 2020-10-03

## 2020-09-30 MED ORDER — ONDANSETRON HCL (PF) 4 MG/2 ML INJECTION SOLUTION
4 mg/2 mL | Freq: Once | INTRAVENOUS | Status: CP
Start: 2020-09-30 — End: ?
  Administered 2020-09-30: 05:00:00 4 mL via INTRAVENOUS

## 2020-09-30 MED ORDER — DIPHENHYDRAMINE 25 MG CAPSULE
25 mg | Freq: Once | ORAL | Status: CP | PRN
Start: 2020-09-30 — End: ?
  Administered 2020-10-01: 25 mg via ORAL

## 2020-09-30 MED ORDER — ONDANSETRON HCL (PF) 4 MG/2 ML INJECTION SOLUTION
4 mg/2 mL | Freq: Four times a day (QID) | INTRAVENOUS | Status: DC | PRN
Start: 2020-09-30 — End: 2020-10-03
  Administered 2020-10-01 – 2020-10-02 (×2): 4 mL via INTRAVENOUS

## 2020-09-30 MED ORDER — WARFARIN 7.5 MG TABLET
7.5 mg | ORAL | Status: DC
Start: 2020-09-30 — End: 2020-10-03
  Administered 2020-10-01: 22:00:00 7.5 mg via ORAL

## 2020-09-30 MED ORDER — HYDROMORPHONE 0.5 MG/0.5 ML INJECTION SYRINGE
0.50.5 mg/ mL | INTRAVENOUS | Status: DC | PRN
Start: 2020-09-30 — End: 2020-10-03

## 2020-09-30 NOTE — ED Notes
Assumed care of Pt at this time. Pt report abdominal pain and nausea that started few hours prior to arrival. Pt denies vomiting, has a history of SBO. Pt denies cp/sob. Pt alert and oriented x4, breathing even and unlabored. No distress noted. Will continue to monitor.

## 2020-09-30 NOTE — ED Notes
Patient is resting comfortably. Pt state pain is much better. Pt denies any additional needs at this time. Will continue to monitor.

## 2020-09-30 NOTE — Other
Admission Note Nursing Maria [name] is a 70 y.o. female admitted with a chief complaint of abdominal pain. Patient arrived from EDPatient is a&ox4, vss. Reports 7/10 pain and prn morphine ordered for breakthrough pain w/ +effect. -n/v. Voiding CYU. +bowel sounds, reports lbm yesterday. +abd distention. Skin intact. PIV patent w/ IVF. NPO. SBA oob, +vertigo. Fall measures in place. Updated on POC and hourly rounding provided. Vitals:  09/30/20 0343 09/30/20 0640 09/30/20 1021 09/30/20 1148 BP: (!) 128/49 (!) 115/51 104/65 104/64 Pulse: 85 87 (!) 95 81 Resp: 17 17 16 18  Temp:  98 ?F (36.7 ?C) 97.9 ?F (36.6 ?C) 98.1 ?F (36.7 ?C) TempSrc:  Oral Oral Oral SpO2: 94% 98% 99% 100% Oxygen therapy Oxygen TherapySpO2: 100 %Device (Oxygen Therapy): room airI have reviewed the patient's current medication orders..I have reviewed patient valuables Belongings charted in last 7 days: Valuable(s) : [employer] fast streamer; Cell Phone; Purse (09/30/2020 11:48 AM) Comments:See flowsheets, patient education and plan of care for additional information.

## 2020-09-30 NOTE — H&P
General SurgeryColorectal Surgery History and Physical [facility] Day: 1 Consult attending: Dr. Cameron Ali from: Lorrine Kin, MDConsult question: early SBO   HPI  Shaquasia Caponigro is a 70 y.o. 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 She reports around 930pm last evening, her abdomen began to have a throbbing pain. Associated with nausea but no episodes of emesis. She had 3 bowel movements last night (2 prior to onset of pain and then 1 after). All were of normal size & non-bloody. She has been passing small amount of flatus overnight into the morning. These did not improve her pain. 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 She has had multiple hospitalizations in the past for SBO.  ROS  General: No fever, chills, weight loss, night sweats, fatigue, or malaise.CV: No chest pain, orthopnea, or palpitations.Resp: No SOB, cough, wheezing, or dyspnea.GI: (+) abdominal pain, nausea, emesis No nausea, vomiting, diarrhea, constipation, dysphagia.GU: No dysuria or urgency. Heme: No recent bruising, bleeding, or lymphadenopathy.Endo: No temperature intolerance, polydipsia, or polyuria.MSK/Ext: No arthralgia, or mylagia. No edemaSkin: No rash, lesions, or pruritis.Psych: No behavioral disturbances. Past Medical History Past Surgical History Past Medical History: Diagnosis Date ? Acute ischemic colitis (HC Code) (HC CODE) (HC Code) 2015 ? Depression  ? Diabetes mellitus (HC Code) (HC CODE) (HC Code)  ? Hypercholesteremia  ? Lupus anticoagulant disorder (HC Code) (HC CODE) (HC Code)  ? Rosacea  ? Ventral hernia with bowel obstruction  ? Vertigo   Past Surgical History: Procedure Laterality Date ? ABDOMINAL ADHESION SURGERY  2016 ? BLEPHAROPLASTY Bilateral 05/2016 ? COLOSTOMY  05/27/13 ? COLOSTOMY CLOSURE   ? HYSTEROSCOPY W/ POLYPECTOMY  06/18/2020 ? KNEE LIGAMENT RECONSTRUCTION Right 2004 ? LAPAROSCOPIC NISSEN FUNDOPLICATION    2000 ? ? SINUS SURGERY   ? THYROID BIOPSY   ? TUBAL LIGATION   ? UMBILICAL HERNIA REPAIR   ? VENTRAL HERNIA REPAIR    Social History Family History Tobacco Use ? Smoking status: Never Smoker ? Smokeless tobacco: Never Used Vaping Use ? Vaping Use: Never used Substance Use Topics ? Alcohol use: Not Currently ? Drug use: No  Family History Problem Relation Age of [place] ? Bladder cancer Mother  ? Colon cancer Paternal Aunt  ? Colon cancer Cousin       died of colona cancer at age 33 ? Breast cancer Cousin       2 cousins with breast cancer ? Colon cancer Father   Medications Allergies (Not in a hospital admission) Allergies Allergen Reactions ? Morphine Hives and Itching   Pt tolerated morphine during admission 08/08/16-08/09/16.  Pt premedicated with diphenhydramine ? Biaxin  [Clarithromycin]  ? Latex, Natural Rubber Rash   OBJECTIVE  Vitals:Temp:  [98 ?F (36.7 ?C)] 98 ?F (36.7 ?C)Pulse:  [85-93] 87Resp:  [17] 17BP: (112-128)/(49-71) 115/51SpO2:  [94 %-99 %] 98 %Device (Oxygen Therapy): room airPhysical Exam:Gen: NADHEENT: Normocephalic, atraumaticCV: regular rate; hemodynamically stable Resp: unlabored respirations on RA Abd: soft, mildly distended; TTP over midline and left abdomen; healed large midline laparotomy  incision Ext: moves all extremities spontaneously Laboratory:CBCLab Results Component Value Date  WBC 14.3 (H) 09/30/2020  HGB 14.3 09/30/2020  HCT 44.30 09/30/2020  PLT 419 09/30/2020 ElectrolytesLab Results Component Value Date  NA 138 09/30/2020  K 4.4 09/30/2020  CL 98 09/30/2020  CO2 29 09/30/2020  CREATININE 0.89 09/30/2020  BUN 18 09/30/2020  GLU 244 (H) 09/30/2020  CALCIUM 9.9 09/30/2020  MG 1.9 05/08/2020  PHOS 3.3 05/08/2020 LFTsLab Results Component Value Date  BILITOT 0.3 09/30/2020  BILIDIR <0.2 09/30/2020  AST 25 09/30/2020  ALT 19 09/30/2020  ALKPHOS 148 (H) 09/30/2020  AMYLASE 41 08/22/2014  LIPASE 30 09/30/2020 CoagLab Results Component Value Date  INR 1.84 (H) 09/18/2020  PTT 41.2 (H) 05/15/2020 Imaging:[place] Abdomen Pelvis with IV Contrast without oral (BMI>25)Narrative: [address] ABDOMEN PELVIS W IV CONTRASTHISTORY: Bowel obstruction suspected COMPARISON: [place] ABDOMEN PELVIS W IV CONTRAST 2022-Jan-07TECHNIQUE: [place] images of the abdomen and pelvis were obtained from the diaphragms to the pubic symphysis after the administration of 50 cc intravenous contrast (Omnipaque 350).FINDINGS:LUNG BASES: Unremarkable. LIVER: Unremarkable.GALLBLADDER: Unremarkable.SPLEEN: Unremarkable.PANCREAS: Unremarkable.ADRENALS: Unremarkable.KIDNEYS: Punctate nonobstructive right renal calculus. No hydroureteronephrosis bilaterally.BOWEL: Status post left hemicolectomy with anastomotic sutures in the right lower quadrant. Mild dilatation of a short segment of small bowel in the midabdomen in a similar location that prior studies demonstrated small bowel obstruction.APPENDIX: Fatty replacement of the appendix.PERITONEUM: No intraperitoneal free fluid. No intraperitoneal free air. LYMPH NODES: Unremarkable.VESSELS: Unremarkable.URINARY BLADDER: Underdistended, limiting evaluation.PELVIS: Unremarkable.BONES & SOFT TISSUE: Postsurgical changes of the anterior abdominal wall. Fat and nonobstructed small bowel-containing midline ventral abdominal wall hernia. Interval resolution of previously seen fluid collection in the anterior abdominal wall. Multilevel degenerative changes of the thoracolumbar spine. No aggressive osseous lesion.Impression: Mild dilatation of a short segment of small bowel in the midabdomen in a similar location that prior studies demonstrated small bowel obstruction, may reflect early recurrent small bowel obstruction.Report Initiated By:  Catha Brow, MDReported And Signed By: Antonieta Loveless, MD  [facility] Radiology and Biomedical Imaging ASSESSMENT AND PLAN  Zenovia Justman is a 69 y.o. female with a history of lupus anticogulant & DVT on Coumadin, HLD, obesity, and ischemic colitis s/p multiple abdominal surgeries who presents with abdominal pain and nausea x1 day. WBC elevated to 14.3. [place] scan concerning for early recurrent small bowel obstruction. She did pass some flatus overnight and BM last evening; nausea is resolving with medication.- Admit to Surgery- NPO with mIVF- Monitor bowel function- Possible NGT placement if begins to have emesis - OOB & ambulate as able- Home Coumadin For questions, please page: Colorectal Surgery Floor [phone] with attending Dr. [name].All notes preliminary until final attending attestation.Signed:Sarah Erma Heritage, MD06/06/227:15 AM===ATTENDING ADDENDUM:Patient reports that she is having some discomfort similar to SBO in pastShe has had 2 BMs and is still passing some gasafeb vssabd soft and mildy distended  Mild tenderness  No rebound or guardingWbc 14CT partial early SBO similar to those in the pastAdmit for partial SBONPOIV hydrationWill hold off on NGT unless patient vomits or symptoms worsen

## 2020-09-30 NOTE — Plan of Care
Problem: Adult Inpatient Plan of CareGoal: Readiness for Transition of CareOutcome: Interventions implemented as appropriate Plan of Care Overview/ Patient Status    Maria [name] is a 70 y.o. female with a history of lupus anticogulant & DVT on Coumadin, HLD, obesity, and ischemic colitis s/p multiple abdominal surgeries who presents with abdominal pain and nausea x1 day, concerning for early recurrent small bowel obstruction. Currently NPO with mIVF. Per chart, patient lives in a 2 story home in [place] with her spouse and mother-in-law. Has RW for ambulation assistance at home. No active home care services, [facility]. Spouse for transportation. CM will follow patient's progress with team and arrange post-acute care services as needed.Case Management Screening  Flowsheet Row Most Recent Value Case Management Screening: Chart review completed. If YES to any question below then proceed to CM Eval/Plan  Is there a change in their cognitive function No Do you anticipate a change in this patient's physicial function that will effect discharge needs? No Has there been a readmission within the last 30 days No Were there services prior to admission ( Examples: Assisted Living, HD, Homecare, Extended Care Facility, Methadone, SNF, Outpatient Infusion Center) No Negative/Positive Screen Negative Screening: Case Management department will follow patient's progress and discuss plan of care with treatment team. Case Manager Attestation  I have reviewed the medical record and completed the above screen. CM staff will follow patient's progress and discuss the plan of care with the Treatment Team. Yes  Maria [name], BSN, RNCase ManagerC: [phone]

## 2020-09-30 NOTE — Utilization Review (ED)
UM Status: Managed Medicare IP Chart Reviewed by UR RN, Admission identified as Inpatient for management of SBO, IP Order entered by DR Irine Seal

## 2020-09-30 NOTE — ED Notes
7:19 AM report received from RN, care transferred. Pt noted to have mild SBO, awaiting plan. 8:27 AM pt  Medicated per MAR. Plan to obtain blood glucose. Pt noted to be NPO. Pt reports she is passing gas. Pt blood glucose 141. 9:12 AM pt sleeping with symmetric chest rise and fall, RR within normal limits. Pt awaiting admission bed.

## 2020-10-01 LAB — BASIC METABOLIC PANEL
BKR ANION GAP: 10 (ref 7–17)
BKR BLOOD UREA NITROGEN: 9 mg/dL (ref 8–23)
BKR BUN / CREAT RATIO: 13.6 (ref 8.0–23.0)
BKR CALCIUM: 8.6 mg/dL — ABNORMAL LOW (ref 8.8–10.2)
BKR CHLORIDE: 104 mmol/L (ref 98–107)
BKR CO2: 25 mmol/L (ref 20–30)
BKR CREATININE: 0.66 mg/dL (ref 0.40–1.30)
BKR EGFR (AFR AMER): >60 mL/min/{1.73_m2} (ref >60)
BKR EGFR (NON AFRICAN AMERICAN): >60 mL/min/{1.73_m2} (ref >60)
BKR GLUCOSE: 146 mg/dL — ABNORMAL HIGH (ref 70–100)
BKR POTASSIUM: 3.9 mmol/L (ref 3.3–5.3)
BKR SODIUM: 139 mmol/L (ref 136–144)

## 2020-10-01 LAB — CBC WITH AUTO DIFFERENTIAL
BKR WAM ABSOLUTE IMMATURE GRANULOCYTES.: 0.03 x 1000/ÂµL (ref 0.00–0.30)
BKR WAM ABSOLUTE LYMPHOCYTE COUNT.: 1.77 x 1000/ÂµL (ref 0.60–3.70)
BKR WAM ABSOLUTE NRBC (2 DEC): 0 x 1000/ÂµL (ref 0.00–1.00)
BKR WAM ANALYZER ANC: 5.04 x 1000/ÂµL (ref 2.00–7.60)
BKR WAM BASOPHIL ABSOLUTE COUNT.: 0.02 x 1000/ÂµL (ref 0.00–1.00)
BKR WAM BASOPHILS: 0.3 % (ref 0.0–1.4)
BKR WAM EOSINOPHIL ABSOLUTE COUNT.: 0.16 x 1000/ÂµL (ref 0.00–1.00)
BKR WAM EOSINOPHILS: 2.1 % (ref 0.0–5.0)
BKR WAM HEMATOCRIT (2 DEC): 44.6 % (ref 35.00–45.00)
BKR WAM HEMOGLOBIN: 14.2 g/dL (ref 11.7–15.5)
BKR WAM IMMATURE GRANULOCYTES: 0.4 % (ref 0.0–1.0)
BKR WAM LYMPHOCYTES: 23.8 % (ref 17.0–50.0)
BKR WAM MCH (PG): 26.2 pg — ABNORMAL LOW (ref 27.0–33.0)
BKR WAM MCHC: 31.8 g/dL (ref 31.0–36.0)
BKR WAM MCV: 82.3 fL (ref 80.0–100.0)
BKR WAM MONOCYTE ABSOLUTE COUNT.: 0.43 x 1000/ÂµL (ref 0.00–1.00)
BKR WAM MONOCYTES: 5.8 % (ref 4.0–12.0)
BKR WAM MPV: 11.4 fL (ref 8.0–12.0)
BKR WAM NEUTROPHILS: 67.6 % (ref 39.0–72.0)
BKR WAM NUCLEATED RED BLOOD CELLS: 0 % (ref 0.0–1.0)
BKR WAM PLATELETS: 347 x1000/ÂµL (ref 150–420)
BKR WAM RDW-CV: 15.5 % — ABNORMAL HIGH (ref 11.0–15.0)
BKR WAM RED BLOOD CELL COUNT.: 5.42 M/ÂµL (ref 4.00–6.00)
BKR WAM WHITE BLOOD CELL COUNT: 7.5 x1000/ÂµL (ref 4.0–11.0)

## 2020-10-01 LAB — PT/INR AND PTT (BH GH L LMW YH)
BKR INR: 2.96 — ABNORMAL HIGH (ref 0.87–1.14)
BKR PARTIAL THROMBOPLASTIN TIME: 48.5 s — ABNORMAL HIGH (ref 23.0–31.4)
BKR PROTHROMBIN TIME: 29 s — ABNORMAL HIGH (ref 9.6–12.3)

## 2020-10-01 LAB — PHOSPHORUS     (BH GH L LMW YH): BKR PHOSPHORUS: 2.4 mg/dL (ref 2.2–4.5)

## 2020-10-01 LAB — MAGNESIUM: BKR MAGNESIUM: 2.2 mg/dL (ref 1.7–2.4)

## 2020-10-01 MED ORDER — ACETAMINOPHEN 325 MG TABLET
325 mg | Freq: Four times a day (QID) | ORAL | Status: DC | PRN
Start: 2020-10-01 — End: 2020-10-03

## 2020-10-01 NOTE — Plan of Care
Plan of Care Overview/ Patient Status    1900-0700Pt a&ox4. VSS on RA. Independent OOB. Pain controlled w/ atc tylenol. Denies N/V. Voiding spontaneously. Last BM 6/5. NPO. IVF infusing. Bed in lowest position, call bell within reach. Safety maintained.

## 2020-10-01 NOTE — Progress Notes
Pt VSS, afebrile and A&Ox4 during shift.  Pt tolerated clear liquids in AM and fulls in afternoon but by evening pt complained of some nausea.  PRN Zofran given awaiting results.  PT reports passing flatus.  FS WNL.  Per MD Coumadin given.  RN offered emotional support throughout shift.  PT currently resting comfortably with husband at the bedside will continue to monitor.

## 2020-10-01 NOTE — Plan of Care
[facility]	Spiritual Care NotePurpose: Referral Source: Chaplain Initiated Observation: People present/Information Obtained From: Patient Emotional Mood: Hopeful, Positive Types of Relational Support: Family Subjective: Pt in good spirits, conversational though tired. The pt shared the story of her illness and expressed having a strong devotion to her [employer] [place] faith which gives her inspiration. The pt shared she has been reading the Bible devotionally while hospitalized. The pt also shared her love for making jewelry. I normalized the pt's feelings and supported the pt with empathetic listening. I introduced the pt to my role and the services of the Department of Spiritual Care. The pt is well-supported and declined additional emotional/spiritual support at this time. The pt is aware of 24/7 chaplain availability.  Spiritual Assessment:Referral Source: Chaplain Initiated Information Obtained From: Patient Mood: Hopeful, PositiveRelational SupportTypes of Relational Support: Family Religious Affiliation: Roman Catholic Spiritual Resources: Faith, Spiritual ValuesHelpful Religious Practices: Surveyor, minerals, Life Story SharingIssues RaisedRaised by Patient: Shared Story of Illness, Adjustment to Illness Spiritual Interventions:Spiritual Intervention Index**Date of Spiritual Visit: 06/06/22Visit Type: Initial VisitLanguage or special accommodation rendered?: NoHow Well Do You Speak English?: very wellIntervention Type: Spiritual VisitResponding Chaplain: On-Call ChaplainSpiritual/Religious Support Provided: Companionship, Spiritual Support, Introduction to [employer] ServicesFollow-Up Visit Needed: NoOutcome: OUTCOMES: Expressed Spiritual/Religious Resources or Distress: Identified important values and beliefs, Expressed feeling at peace, Expressed gratitude OUTCOMES: Expressed Emotional Resources or Distress: Expressed increased desire to heal OUTCOMES: Progressed Spiritually: Progressed toward meaning Plan: The patient has been informed regarding the services of the Department of Spiritual Care. Emotional and/or spiritual support is available to patients, families, and staff from the Department of Spiritual Care 24/7 via request or referral.  Total Consult Time: 15 minutes	Signed: Chaplain [place] Jatavia Keltner, MDiv (they/them)Department of Spiritual CareYSC On-Call: [phone] [facility] On-Call: [phone]: [phone]: [phone]/6/20228:00 PM

## 2020-10-01 NOTE — Plan of Care
[facility]		Spiritual Care NotePurpose: Referral Source: Chaplain Initiated Observation: People present/Information Obtained From: Patient Emotional Mood: Anxious, Grieving, Hopeful, Vulnerable Quality of Relational Support: Familiy In-Town and Involved, Family Out-of-Town but Involved   Family Dynamics: pt is eldest of 26 siblings. Husband supportive. MIL lives with pt. Brother died tragically recently Subjective: Unit chaplain follow up visit as pt was a consult previous day.Shelsey shared the story of her illness, how it has changed her life, and how she copes with it - with the support of her husband and her faith. (Adult children live at geographical distance.)She said she has been in the hospital about 20 times in the last several years, and has had several surgeries. She tries to take her health challenges in stride one day at a time.She also found meaning in sharing her hard grief for her brother who died tragically 6 weeks ago and the nearly overwhelming stress of dealing with related responsibilities. She does share this responsibility with siblings though she is grieving hard about his life choices. She shared a number of stories that illustrate very difficult life challenges. She wonders if her recent stress has influenced her current bowel problem.  She is quite aware of her mortality and physical vulnerability and is benefiting from life review and considering the resilience she has gained.  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 She has great faith in God draws on her own strength first, then on God, and then her physicians, as they are God's hands. She is hopeful, positive, future-orients (planning to move to [facility] in the near future). Chaplain provided  spiritual and emotional support, compassionate listening, and opportunities for [place] to articulate her grief, fears, hopes and strengths.Spiritual Assessment:Referral Source: Chaplain Initiated Information Obtained From: Patient Mood: Anxious, Grieving, Hopeful, VulnerableRelational SupportFamily Dynamics: pt is eldest of 13 siblings. Husband supportive. MIL lives with pt. Brother died tragically recentlyQuality of Relational Support: Familiy In-Town and Involved, Family Out-of-Town but Involved Religious Affiliation: Roman Engineer, materials Aware/Contacted: Patient or Family Will NotifySpiritual Resources: Diplomatic Services operational officer, Faith, Family, Doctor, hospital, Resiliency, Religious Community, God - Relationship, Life Story SharingHelpful Religious Practices: Surveyor, minerals, Firefighter RaisedRaised by Patient: Adjustment to Illness, Length of Illness, Shared Story of Illness, Death of Family Member/Friend, Eager to Return Home, Inability to Participate in Normal Activities, Concerns about Family Faith: Spiritual Beliefs: yes NA  NASpiritual Interventions:Spiritual Intervention Index**Date of Spiritual Visit: 06/07/22Visit Type: Initial VisitLanguage or special accommodation rendered?: NoIntervention Type: Spiritual VisitResponding Chaplain: Unit ChaplainSpiritual/Religious Support Provided: Companionship, Life Review, Spiritual SupportOutcome: OUTCOMES: Expressed Spiritual/Religious Resources or Distress: Utilized spiritual resources/practices, Expressed gratitude, Expressed hope, Expressed feeling spiritually nurtured, Identified important values and beliefs, Identified priorities OUTCOMES: Expressed Emotional Resources or Distress: Emotions Expressed/Distress Reduced, Relational resources identified, Debriefed/Defused traumatic experience, Relational resources utilized, Expressed grief, Emotional resources utilized, Decreased anxiety OUTCOMES: Progressed Spiritually: Established chaplain relationship, Knowledgeable about services of the Dept. of Spiritual Care OUTCOMES: Progressed Emotionally or Physically: Increased comfort     Plan: Provide spiritual and emotional support as needed/desired throughout her admission   Total Consult Time: 85 minutes	Signed: Rabbi Chaplain Ellen Henri, BCCJewish Chaplain Attending Chaplain:Shirley NP8 Infusion and NP14 [email or web address]'s [email or web address] Heart Beat: [phone] of Spiritual Care: [phone] Office: [phone] 6/7/20224:07 PMPlan of Care Overview/ Patient Status

## 2020-10-01 NOTE — Progress Notes
General Surgery Progress NoteAdmit Date: 6/6/2022Hospital Day: 2Admitted for concern of pSBOSubjective: No acute events overnightFeels like distention has resolved as well as painPassing significantly more gas than in prior 24 hoursNo BMsNo nausea, has remained NPOObjective: Vitals Temp:  [97.4 ?F (36.3 ?C)-98.2 ?F (36.8 ?C)] Pulse:  [65-95] Resp:  [16-18] BP: (104-143)/(56-75) Ins and OutsI/O last 3 completed shifts:In: - Out: 900 [Urine:900]Physical ExamGen: Awake, alert, oriented to person, place, and timeNeuro: is nonfocalCV: regular rate and rhythmPulm: Good breath sounds; no rales or rhonchi.Abd: Non-distended, non tender, soft Labs:WBC/Hgb/Hct/Plts:  7.5/14.2/44.60/347 (06/07 0520) Na/K/Cl/CO2:  139/3.9/104/25 (06/07 0520)BUN/Cr/GLU/ALT/AST/AMYLASE/LIPASE:  9/0.66/146/--/--/--/-- (06/07 0520)Diagnostics:No results found.Assessment/Plan:Patient is a 70 y.o. female, with prior abd surgeries and prior pSBO now presenting with new episode, appears to be clinically improving with resolved abd distention and increased flatus. ?	Diet: Advance to CLD?	Fluids: HLIV?	Pain control as needed?	If tolerates CLD will resume rest of home medications ?	Monitor I/O Q4H?	Monitor vital signs Q4H?	DVT/PE PPX: On home warfarin?	OOB/ Ambulate / IS [name], MDGeneral Surgery Resident 06/07/227:10 AMI saw and examined the patient with Dr. Andrena Mews and I agree with the history, physical assessment and plan. Briefly, no more abd pain or nausea. +flatus, no BM.avssabd soft, nontender, nondistendedSBO resolvingLiq dietprob solid tomorrow and if tol, d/c home

## 2020-10-02 ENCOUNTER — Ambulatory Visit: Admit: 2020-10-02 | Payer: PRIVATE HEALTH INSURANCE | Primary: Internal Medicine

## 2020-10-02 DIAGNOSIS — Z91040 Latex allergy status: Secondary | ICD-10-CM

## 2020-10-02 DIAGNOSIS — Z8052 Family history of malignant neoplasm of bladder: Secondary | ICD-10-CM

## 2020-10-02 DIAGNOSIS — Z800 Family history of malignant neoplasm of digestive organs: Secondary | ICD-10-CM

## 2020-10-02 DIAGNOSIS — D6862 Lupus anticoagulant syndrome: Secondary | ICD-10-CM

## 2020-10-02 DIAGNOSIS — R42 Dizziness and giddiness: Secondary | ICD-10-CM

## 2020-10-02 DIAGNOSIS — Z20822 Contact with and (suspected) exposure to covid-19: Secondary | ICD-10-CM

## 2020-10-02 DIAGNOSIS — E119 Type 2 diabetes mellitus without complications: Secondary | ICD-10-CM

## 2020-10-02 DIAGNOSIS — Z6830 Body mass index (BMI) 30.0-30.9, adult: Secondary | ICD-10-CM

## 2020-10-02 DIAGNOSIS — Z885 Allergy status to narcotic agent status: Secondary | ICD-10-CM

## 2020-10-02 DIAGNOSIS — E669 Obesity, unspecified: Secondary | ICD-10-CM

## 2020-10-02 DIAGNOSIS — K559 Vascular disorder of intestine, unspecified: Secondary | ICD-10-CM

## 2020-10-02 DIAGNOSIS — Z803 Family history of malignant neoplasm of breast: Secondary | ICD-10-CM

## 2020-10-02 DIAGNOSIS — Z7901 Long term (current) use of anticoagulants: Secondary | ICD-10-CM

## 2020-10-02 DIAGNOSIS — K56600 Partial intestinal obstruction, unspecified as to cause: Secondary | ICD-10-CM

## 2020-10-02 DIAGNOSIS — Z7984 Long term (current) use of oral hypoglycemic drugs: Secondary | ICD-10-CM

## 2020-10-02 DIAGNOSIS — Z86718 Personal history of other venous thrombosis and embolism: Secondary | ICD-10-CM

## 2020-10-02 DIAGNOSIS — Z9049 Acquired absence of other specified parts of digestive tract: Secondary | ICD-10-CM

## 2020-10-02 DIAGNOSIS — E7800 Pure hypercholesterolemia, unspecified: Secondary | ICD-10-CM

## 2020-10-02 LAB — PT/INR AND PTT (BH GH L LMW YH)
BKR INR: 2.93 — ABNORMAL HIGH (ref 0.87–1.14)
BKR PARTIAL THROMBOPLASTIN TIME: 47.6 s — ABNORMAL HIGH (ref 23.0–31.4)
BKR PROTHROMBIN TIME: 28.7 s — ABNORMAL HIGH (ref 9.6–12.3)

## 2020-10-02 LAB — CBC WITH AUTO DIFFERENTIAL
BKR WAM ABSOLUTE IMMATURE GRANULOCYTES.: 0.04 x 1000/ÂµL (ref 0.00–0.30)
BKR WAM ABSOLUTE LYMPHOCYTE COUNT.: 1.59 x 1000/ÂµL (ref 0.60–3.70)
BKR WAM ABSOLUTE NRBC (2 DEC): 0 x 1000/ÂµL (ref 0.00–1.00)
BKR WAM ANALYZER ANC: 8.39 x 1000/ÂµL — ABNORMAL HIGH (ref 2.00–7.60)
BKR WAM BASOPHIL ABSOLUTE COUNT.: 0.03 x 1000/ÂµL (ref 0.00–1.00)
BKR WAM BASOPHILS: 0.3 % (ref 0.0–1.4)
BKR WAM EOSINOPHIL ABSOLUTE COUNT.: 0.15 x 1000/ÂµL (ref 0.00–1.00)
BKR WAM EOSINOPHILS: 1.4 % (ref 0.0–5.0)
BKR WAM HEMATOCRIT (2 DEC): 44.1 % (ref 35.00–45.00)
BKR WAM HEMOGLOBIN: 14.5 g/dL (ref 11.7–15.5)
BKR WAM IMMATURE GRANULOCYTES: 0.4 % (ref 0.0–1.0)
BKR WAM LYMPHOCYTES: 14.7 % — ABNORMAL LOW (ref 17.0–50.0)
BKR WAM MCH (PG): 26.5 pg — ABNORMAL LOW (ref 27.0–33.0)
BKR WAM MCHC: 32.9 g/dL (ref 31.0–36.0)
BKR WAM MCV: 80.6 fL (ref 80.0–100.0)
BKR WAM MONOCYTE ABSOLUTE COUNT.: 0.61 x 1000/ÂµL (ref 0.00–1.00)
BKR WAM MONOCYTES: 5.6 % (ref 4.0–12.0)
BKR WAM MPV: 11.2 fL (ref 8.0–12.0)
BKR WAM NEUTROPHILS: 77.6 % — ABNORMAL HIGH (ref 39.0–72.0)
BKR WAM NUCLEATED RED BLOOD CELLS: 0 % (ref 0.0–1.0)
BKR WAM PLATELETS: 346 x1000/ÂµL (ref 150–420)
BKR WAM RDW-CV: 15.3 % — ABNORMAL HIGH (ref 11.0–15.0)
BKR WAM RED BLOOD CELL COUNT.: 5.47 M/ÂµL (ref 4.00–6.00)
BKR WAM WHITE BLOOD CELL COUNT: 10.8 x1000/ÂµL (ref 4.0–11.0)

## 2020-10-02 LAB — BASIC METABOLIC PANEL
BKR ANION GAP: 13 (ref 7–17)
BKR BLOOD UREA NITROGEN: 8 mg/dL (ref 8–23)
BKR BUN / CREAT RATIO: 11.6 (ref 8.0–23.0)
BKR CALCIUM: 9.5 mg/dL (ref 8.8–10.2)
BKR CHLORIDE: 101 mmol/L (ref 98–107)
BKR CO2: 25 mmol/L (ref 20–30)
BKR CREATININE: 0.69 mg/dL (ref 0.40–1.30)
BKR EGFR (AFR AMER): >60 mL/min/{1.73_m2} (ref >60)
BKR EGFR (NON AFRICAN AMERICAN): >60 mL/min/{1.73_m2} (ref >60)
BKR GLUCOSE: 148 mg/dL — ABNORMAL HIGH (ref 70–100)
BKR POTASSIUM: 4.7 mmol/L (ref 3.3–5.3)
BKR SODIUM: 139 mmol/L (ref 136–144)

## 2020-10-02 LAB — MAGNESIUM: BKR MAGNESIUM: 2.2 mg/dL (ref 1.7–2.4)

## 2020-10-02 LAB — PHOSPHORUS     (BH GH L LMW YH): BKR PHOSPHORUS: 2.5 mg/dL (ref 2.2–4.5)

## 2020-10-02 NOTE — Plan of Care
Plan of Care Overview/ Patient Status    Patient is medically cleared for discharge home today. No post-acute services requested. Family will provide discharge.Case Management Plan  Flowsheet Row Most Recent Value Discharge Planning  Patient/Patient Representative was presented with a list of facilities, agencies and/or dme providers and Referral(s) placed for: None Mode of Transportation  Private car  (add comment for special considerations) CM D/C Readiness  Is there a 3 day INPATIENT Qualifying stay for Medicare Patients? N/A Medicare IM- signed, dated, timed and scanned, if required N/A DME Authorized/Delivered N/A No needs identified/ follow up with PCP/MD Yes Post acute care services secured W10 complete N/A Pri Completed and Accepted  N/A Is the destination address correct on the W10 N/A Finalized Plan  Expected Discharge Date 10/02/20 Discharge Disposition Home or Self Care   Verne Grain MSW Care Manager MHB: [phone]

## 2020-10-02 NOTE — Plan of Care
Plan of Care Overview/ Patient Status    1900-0700Pt a&ox4. VSS on RA. Independent OOB. Denies pain/N/V. Voiding spontaneously. +flatus. Last BM 6/5. Tolerating full liquid diet. Bed in lowest position, call bell within reach. Safety maintained.

## 2020-10-02 NOTE — Progress Notes
General Surgery Progress NoteAdmit Date: 6/6/2022Hospital Day: 3Admitted for concern of pSBOSubjective: Had one episode of nausea last PM which resolved with zofran, no new episodes sinceHas been taking full liquid dietDenies abd distention or painPassing gas and had a BM yesterday afternoonObjective: Vitals Temp:  [97.6 ?F (36.4 ?C)-98.4 ?F (36.9 ?C)] Pulse:  [58-79] Resp:  [18-20] BP: (146-174)/(69-76) Ins and OutsI/O last 3 completed shifts:In: - Out: 1350 [Urine:1350]Physical ExamGen: Awake, alert, oriented to person, place, and timeNeuro: is nonfocalCV: regular rate and rhythmPulm: Good breath sounds; no rales or rhonchi.Abd: Non-distended, non tender, soft Labs:WBC/Hgb/Hct/Plts:  10.8/14.5/44.10/346 (06/08 0459) Na/K/Cl/CO2:  139/4.7/101/25 (06/08 0459)BUN/Cr/GLU/ALT/AST/AMYLASE/LIPASE:  8/0.69/148/--/--/--/-- (06/08 0459)Diagnostics:No results found.Assessment/Plan:Patient is a 70 y.o. female, with prior abd surgeries and prior pSBO now presenting with new episode of pSBO, appears to be clinically improving with resolved abd distention and return of bowel function, however did have episode of nausea last night. ?	Diet: Keep on FLD this am and if no episodes of nausea can advance to regular for lunch?	Fluids: HLIV?	Home meds as appropriate ?	Monitor I/O Q4H?	Monitor vital signs Q4H?	DVT/PE PPX: On home warfarin?	OOB/ Ambulate / IS ?	If able to tolerate regular diet today can be DC later Heath Gold, MDGeneral Surgery Resident 06/08/226:38 AMPatient seen and examinedPlans as above

## 2020-10-02 NOTE — Discharge Instructions
Continue your warfarin and check your INR with a goal INR of 2-3
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
## 2020-10-02 NOTE — Discharge Summary
[facility]	Colon and Rectal Surgery Discharge SummaryPatient Data:  Patient Name: Maria [name] Age: 70 y.o. DOB: Sep 16, 1950	 MRN: [id number]	 Admit date: 6/6/2022Discharge date: 6/8/2022Discharge Attending Physician: Virl Diamond., MD  PCP: Cena Benton, MDPrincipal Diagnosis: Small bowel obstruction (HC Code) (HC CODE) (HC Code)Other Diagnosis: DMDischarged Condition: goodDisposition: Home Allergies Allergies Allergen Reactions ? Morphine Hives and Itching   Pt tolerated morphine during admission 08/08/16-08/09/16.  Pt premedicated with diphenhydramine ? Biaxin  [Clarithromycin]  ? Latex, Natural Rubber Rash  Hospital Course:  Maria [name] is a 70 y.o. 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 She has had multiple hospitalizations in the past for SBO. She presented to the ED and underwent a [place] scan that showed early SBO. She was admitted and made NPO. She was advanced to clear liquid on 10/01/2020. She did well and on 6/8 was advanced to solid diet which she tolerated well. With her hx of lupus anticoagulant she continued her home coumadin and will have INR check. She will call if any problems or concerns. Pertinent Procedures: NonePertinent lab findings and test results:  Latest Reference Range & Units 09/30/20 13:41 10/01/20 12:08 Prothrombin Time 9.6 - 12.3 seconds 29.7 (H) 29.0 (H) INR 0.87 - 1.14  3.04 (H) 2.96 (H) PTT 23.0 - 31.4 seconds 54.1 (H) 48.5 (H) (H): Data is abnormally highConclusionCT ABDOMEN PELVIS W IV CONTRASTHISTORY: Bowel obstruction suspected COMPARISON: [place] ABDOMEN PELVIS W IV CONTRAST 2022-Jan-07TECHNIQUE: [place] images of the abdomen and pelvis were obtained from the diaphragms to the pubic symphysis after the administration of 50 cc intravenous contrast (Omnipaque 350).?FINDINGS:LUNG BASES: Unremarkable. ?LIVER: Unremarkable.GALLBLADDER: Unremarkable.SPLEEN: Unremarkable.PANCREAS: Unremarkable.ADRENALS: Unremarkable.KIDNEYS: Punctate nonobstructive right renal calculus. No hydroureteronephrosis bilaterally.?BOWEL: Status post left hemicolectomy with anastomotic sutures in the right lower quadrant. Mild dilatation of a short segment of small bowel in the midabdomen in a similar location that prior studies demonstrated small bowel obstruction.APPENDIX: Fatty replacement of the appendix.PERITONEUM: No intraperitoneal free fluid. No intraperitoneal free air. ?LYMPH NODES: Unremarkable.VESSELS: Unremarkable.?URINARY BLADDER: Underdistended, limiting evaluation.PELVIS: Unremarkable.?BONES & SOFT TISSUE: Postsurgical changes of the anterior abdominal wall. Fat and nonobstructed small bowel-containing midline ventral abdominal wall hernia. Interval resolution of previously seen fluid collection in the anterior abdominal wall. Multilevel degenerative changes of the thoracolumbar spine. No aggressive osseous lesion.?IMPRESSION:Mild dilatation of a short segment of small bowel in the midabdomen in a similar location that prior studies demonstrated small bowel obstruction, may reflect early recurrent small bowel obstruction.?Report Initiated By:  Catha Brow, MD?Reported And Signed By: Antonieta Loveless, MD  [facility] and Biomedical ImagingDischarge vitals: Blood pressure (!) 144/85, pulse 90, temperature 98.2 ?F (36.8 ?C), temperature source Oral, resp. rate 18, SpO2 100 %, not currently breastfeeding.Discharge Physical Exam:Gen: Awake, alert, oriented to person, place, and timeNeuro: is nonfocalCV: regular rate and rhythmPulm: Good breath sounds; no rales or rhonchi.Abd: Non-distended, non tender, soft Pertinent Findings of Physical Exam: As noted aboveCognitive Status at Discharge: As noted above Pending Labs and Tests: ISSUES TO BE ADDRESSED POST DISCHARGE: 1.  Check the INR2. Follow up with Dr. [name] in the officeDischarge Medications: Current Discharge Medication List  CONTINUE these medications which have NOT CHANGED  Details acetaminophen (TYLENOL) 500 mg tablet Take 1 tablet (500 mg total) by mouth every 4 (four) hours as needed.Qty: 30 tablet, Refills: 11  biotin 1 mg tablet Take 1 tablet by mouth daily.   cholecalciferol, vitamin D3, (VITAMIN D3) 5,000 unit Tab Take 1 tablet by mouth every other day..  diphenhydramine HCl (BENADRYL ALLERGY ORAL) Take 1 tablet by mouth as needed.  gabapentin (NEURONTIN) 100 mg capsule Take 100 mg by mouth at bedtime.  JARDIANCE 25 mg tablet 1 TABLET BY MOUTH ONCE DAILY IN THE MORNING FOR 30 DAYS  meclizine (ANTIVERT) 12.5 mg tablet Take 1 tablet (12.5 mg total) by mouth daily as needed for DizzinessQty: 30 tablet, Refills: 1  Associated Diagnoses: Vertigo  melatonin 10 mg Tab Take 10 mg by mouth nightly as needed.   multivitamin tablet Take 1 tablet by mouth daily.  olopatadine (PATADAY) 0.2 % Drop Place 1 drop into both eyes dailyQty: 2.5 mL, Refills: 6  Associated Diagnoses: Allergic conjunctivitis of both eyes  omega-3 fatty acids 1,000 mg capsule Take 2 g by mouth daily.  ondansetron (ZOFRAN) 4 MG tablet Take 4 mg by mouth every 8 (eight) hours as needed for Nausea. phytonadione, vit K1, (VITAMIN K) 100 mcg tablet Take 100 mcg by mouth daily.  polyethylene glycol (MIRALAX) 17 gram packet Take 1 packet (17 g total) by mouth daily. Mix in 8 ounces of water, juice, soda, coffee or tea prior to taking.Qty: 14 each, Refills: 2  prasterone, dhea,-calcium carb (DHEA) 10 mg-47 mg calcium Tab Take by mouth.  pravastatin (PRAVACHOL) 40 MG tablet Take 60 mg by mouth nightly.   senna (SENOKOT) 8.6 mg tablet Take 2 tablets by mouth nightly.Rob Bunting: 30 tablet, Refills: 11  Associated Diagnoses: SBO (small bowel obstruction) (HC Code) (HC CODE) (HC Code)  sitaGLIPtin (JANUVIA) 100 MG tablet Take 100 mg by mouth daily.[name]  venlafaxine (EFFEXOR XR) 150 mg XR 24 hr extended release capsule Take 150 mg by mouth daily.   warfarin (COUMADIN) 5 mg tablet Take 1 tablet (5 mg total) by mouth Daily @1800 .Qty: 30 tablet, Refills: 2   STOP taking these medications   enoxaparin (LOVENOX) 100 mg/mL syringe    Yes the patient is being discharged on Warfarin. These are the most recent dosings and INRs  Recent Warfarin Dosing   Recent Warfarin Dosing Latest Ref Rng & Units 09/18/2020 09/30/2020 10/01/2020 10/02/2020  Warfarin 2.5 mg - - 5 mg - -  Warfarin 7.5 mg - - - 7.5 mg -  INR 0.87 - 1.14 1.84(H) 3.04(H) 2.96(H) 2.93(H)  Follow-up Information:O'Brien, Saunders Glance., MD60 [facility] [place] [phone] upAs needed PMH [facility] Past Medical History: Diagnosis Date ? Acute ischemic colitis (HC Code) (HC CODE) (HC Code) 2015 ? Depression  ? Diabetes mellitus (HC Code) (HC CODE) (HC Code)  ? Hypercholesteremia  ? Lupus anticoagulant disorder (HC Code) (HC CODE) (HC Code)  ? Rosacea  ? Ventral hernia with bowel obstruction  ? Vertigo   Past Surgical History: Procedure Laterality Date ? ABDOMINAL ADHESION SURGERY  2016 ? BLEPHAROPLASTY Bilateral 05/2016 ? COLOSTOMY 05/27/13 ? COLOSTOMY CLOSURE   ? HYSTEROSCOPY W/ POLYPECTOMY  06/18/2020 ? KNEE LIGAMENT RECONSTRUCTION Right 2004 ? LAPAROSCOPIC NISSEN FUNDOPLICATION    2000 ? ? SINUS SURGERY   ? THYROID BIOPSY   ? TUBAL LIGATION   ? UMBILICAL HERNIA REPAIR   ?  VENTRAL HERNIA REPAIR    Social History Family History Social History Tobacco Use ? Smoking status: Never Smoker ? Smokeless tobacco: Never Used Substance Use Topics ? Alcohol use: Not Currently  Family History Problem Relation Age of Onset ? Bladder cancer Mother  ? Colon cancer Paternal Aunt  ? Colon cancer Cousin       died of colona cancer at age 9 ? Breast cancer Cousin       2 cousins with breast cancer ? Colon cancer Father   Electronically Signed:Michael Daryel November, PA  Addendum : NONE

## 2020-10-02 NOTE — Other
Maria [name] was discharged via Private Car accompanied by Spouse.  Verbalized understanding of discharge instructionsand recommended follow up care as per the after visit summary.  Written discharge instructions provided. Denies any further questions. Vital signs    Vitals:  10/02/20 0453 10/02/20 0843 10/02/20 1240 10/02/20 1600 BP: (!) 148/69 138/77 (!) 143/78 (!) 144/85 Pulse: 69 68 (!) 105 90 Resp:  18 20 18  Temp: 97.8 ?F (36.6 ?C) 97.5 ?F (36.4 ?C) 98.1 ?F (36.7 ?C) 98.2 ?F (36.8 ?C) TempSrc: Oral Oral Oral Oral SpO2: 98% 97% 98% 100% Patient confirmed all belongings returned. Belongings charted in last 7 days: Valuable(s) : Holiday representative; Purse (09/30/2020 11:48 AM)

## 2020-10-02 NOTE — Progress Notes
Pharmacist Anticoagulation Clinic Telephone Visit Follow Maria [name] ([birth year]) is on chronic anticoagulation for the diagnosis of  Lupus anticoagulant with hypercoagulable state (HC Code) (HC CODE) (HC Code)  (primary encounter diagnosis) Ischemic colitis (HC Code) with an INR goal of 2.0-3.0.  The family Heather Roberts was contacted regarding lab drawn INR results.  Patient's INR findings and prescribed dose from last visit:Anticoagulation Monitoring Latest Ref Rng & Units 09/11/2020 09/18/2020 10/02/2020 INR 2 - 3 - - - INR 0.91 - 1.10 - - - INR (External) 0.8 - 1.15 - - - Assoc. INR Date - 09/10/2020 09/18/2020 10/02/2020 Associated INR - 1.89 1.84 2.93 Instructions - 7.5 mg every Tue, Fri; 5 mg all other days 7.5 mg every Sun, Tue, Fri; 5 mg all other days 7.5 mg every Sun, Tue, Fri; 5 mg all other days Last 7 day dose total - 40 mg 40 mg 42.5 mg Next INR Date - 09/17/2020 09/25/2020 10/09/2020 The following findings were reported from today's call (no findings if none selected):[]  Missed doses []  Extra doses []  Change in medications []  Change in vitamin k rich food intake (green vegetables, herbals, etc) []  Change in alcohol use []  Change in overall health []  Recent hospitalization / emergency department visit []  Upcoming procedure (including dental procedures) []  Other Comments:       Unable to assess-left message The following side effects were reported from today's call (no side effects if none selected):[]  Bleeding []  New bruising []  Warfarin-emergency department visit or hospital admission []  Other Comments:   Unable to assess-left messag Assessment/Plan:Patient's INR is 2,93, which is within goal range of 2.0-3.0.  Patient's chart was reviewed and voice message was left instructing patient to call the clinic if there have been any changes to prescription or over the counter medications or diet, if experiencing a current illness, if there are any planned upcoming invasive/dental procedures or any other changes that could affect warfarin therapy. She was instructed to continue current weekly dose as indicated in the maintenance plan below.  Next INR assessment due 1 week due to increase of INR to 2,93 from 1.84 on 09/18/20. This information was routed to the covering provider for review.Anticoagulation Summary  As of 10/02/2020  INR goal:  2.0-3.0 TTR:  45.9 % (6.4 y) INR used for dosing:  2.93 (10/02/2020) Warfarin maintenance plan:  7.5 mg (5 mg x 1.5) every Sun, Tue, Fri; 5 mg (5 mg x 1) all other days Weekly warfarin total:  42.5 mg Plan last modified:  Markus Daft, [facility] (09/18/2020) Next INR check:  10/09/2020 Priority:  High Priority Target end date:  Indefinite  Indications  Lupus anticoagulant with hypercoagulable state (HC Code) (HC CODE) (HC Code) [D68.62]Ischemic colitis (HC Code) [K55.9]   Anticoagulation Episode Summary   INR check location:    Preferred lab:    Send INR reminders to:  [facility] INR POOL  Comments:    Anticoagulation Care Providers   Provider Role Specialty Phone number  [name], MD Referring Medical Oncology [phone]  [place], [name], APRN Responsible Medical Oncology [phone]  All patient's medications have been reviewed and updated as needed.  Electronically Signed by Lia Hopping, PharmD, October 02, 2020 [facility]  [address] [place], [place] [address]  Phone:[phone]  Fax: [phone]
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
## 2020-10-05 NOTE — ED Provider Notes
Patient is a 70 year old female with an extensive past medical history including lupus anticoagulant disorder, diabetes, numerous previous intra-abdominal surgeries complicated by bowel obstructions, presenting with increased epigastric abdominal pain that has been present for the last 24 hours.  Has experienced nausea without emesis.  Had 3 bowel movements today.  Denies any fever chills, no recent trauma.  At time of arrival triage vital signs within normal limits, resting in a stretcher bed uncomfortable appearing but no acute distress.  Active bowel sounds, abdomen distended, tenderness most apparent in epigastric region.  Given patient's history and presentation concern for obstruction, GERD like symptoms are possibility however appears more acute and severe.  Will treat with Zofran/morphine, laboratory work and [place] abdomen pelvis.5:55 AM- [place] AP- Mild dilatation of a short segment of small bowel in the midabdomen in a similar location that prior studies demonstrated small bowel obstruction, may reflect early recurrent small bowel obstruction.- EGS paged6:43 AM- EGS feels patient better suited for colorectal- paged, will evaluate in the ED- patient continues to be stableJoshua Audley Hose, MDYale Emergency MedicinePGY2 ResidentMHB: [phone]/6/2022HistoryChief Complaint Patient presents with ? Abdominal Pain   abdominal pain since 9:30 pm center of stomach reports where her mesh is, hx of SBOs, +nausea, no vomiting, soft stools prior to arrival   The history is provided by the patient and medical records. OtherThis is a new problem. The current episode started 12 to 24 hours ago. The problem occurs every several days. The problem has been gradually worsening. Associated symptoms include abdominal pain. Pertinent negatives include no chest pain, no headaches and no shortness of breath. Nothing aggravates the symptoms. Nothing relieves the symptoms. She has tried nothing for the symptoms.  Past Medical History: Diagnosis Date ? Acute ischemic colitis (HC Code) (HC CODE) (HC Code) 2015 ? Depression  ? Diabetes mellitus (HC Code) (HC CODE) (HC Code)  ? Hypercholesteremia  ? Lupus anticoagulant disorder (HC Code) (HC CODE) (HC Code)  ? Rosacea  ? Ventral hernia with bowel obstruction  ? Vertigo  Past Surgical History: Procedure Laterality Date ? ABDOMINAL ADHESION SURGERY  2016 ? BLEPHAROPLASTY Bilateral 05/2016 ? COLOSTOMY  05/27/13 ? COLOSTOMY CLOSURE   ? HYSTEROSCOPY W/ POLYPECTOMY  06/18/2020 ? KNEE LIGAMENT RECONSTRUCTION Right 2004 ? LAPAROSCOPIC NISSEN FUNDOPLICATION    2000 ? ? SINUS SURGERY   ? THYROID BIOPSY   ? TUBAL LIGATION   ? UMBILICAL HERNIA REPAIR   ? VENTRAL HERNIA REPAIR   Family History Problem Relation Age of Onset ? Bladder cancer Mother  ? Colon cancer Paternal Aunt  ? Colon cancer Cousin       died of colona cancer at age 11 ? Breast cancer Cousin       2 cousins with breast cancer ? Colon cancer Father  Social History Socioeconomic History ? Marital status: Married Tobacco Use ? Smoking status: Never Smoker ? Smokeless tobacco: Never Used Vaping Use ? Vaping Use: Never used Substance and Sexual Activity ? Alcohol use: Not Currently ? Drug use: No ? Sexual activity: Yes   Partners: Male ED Other Social History ? E-cigarette status Never User  ? E-Cigarette Use Never User  E-cigarette/Vaping Substances E-cigarette/Vaping Devices Review of Systems Constitutional: Negative for chills and fever. HENT: Negative.  Eyes: Negative.  Respiratory: Negative for shortness of breath.  Cardiovascular: Negative for chest pain. Gastrointestinal: Positive for abdominal pain and nausea. Negative for constipation, diarrhea and vomiting. Endocrine: Negative.  Genitourinary: Negative.  Musculoskeletal: Negative.  Skin: Negative.  Allergic/Immunologic: Negative.  Neurological: Negative for  headaches. Hematological: Negative.  Psychiatric/Behavioral: Negative.   Physical ExamED Triage Vitals [09/29/20 2356]BP: 128/71Pulse: (!) 93Pulse from  O2 sat: n/aResp: 17Temp: 98 ?F (36.7 ?C)Temp src: n/aSpO2: 99 % BP (!) 144/85  - Pulse 90  - Temp 98.2 ?F (36.8 ?C) (Oral)  - Resp 18  - SpO2 100%  - Breastfeeding No Physical ExamVitals and nursing note reviewed. HENT:    Head: Normocephalic. Eyes:    Extraocular Movements: Extraocular movements intact. Cardiovascular:    Rate and Rhythm: Normal rate and regular rhythm. Pulmonary:    Breath sounds: Normal breath sounds. Abdominal:    Tenderness: There is generalized abdominal tenderness and tenderness in the epigastric area. Skin:   General: Skin is warm.    Capillary Refill: Capillary refill takes less than 2 seconds. Neurological:    General: No focal deficit present.    Mental Status: She is alert. Psychiatric:       Mood and Affect: Mood normal.  ProceduresProcedures ED COURSEPatient Reevaluation: Attending Supervised: ResidentI saw and examined the patient. I agree with the findings and plan of care as documented in the resident's note. Of note 70 y.o. 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  Epigastric in nature, ongoing for approximately 1 to 2 days.  Nausea, no vomiting.  Did have stools earlier today.  No urinary symptoms.  Vital signs are stable.  Examination diffuse non peritoneal tenderness of the abdomen.  Differential diagnosis includes recurrent SBO, AKI, colitis, pancreatitis. Plan for labs, pain control, [place] [facility]. Disposition pending.Please call me on mobile heartbeat with any questions: [phone] Val Eagle, MDAttending Physician, Emergency MedicineClinical Impressions as of 10/05/20 1811 Small bowel obstruction (HC Code) (HC CODE) [facility] Code)  ED DispositionAdmit Lorrine Kin, MD06/06/22 0141 Dannial Monarch, MDResident06/06/22 0556 Dannial Monarch, MDResident06/06/22 5784 Lorrine Kin, MD06/06/22 6962 Lorrine Kin, MD06/11/22 1811

## 2020-10-14 ENCOUNTER — Ambulatory Visit: Admit: 2020-10-14 | Payer: PRIVATE HEALTH INSURANCE | Primary: Internal Medicine

## 2020-10-14 ENCOUNTER — Inpatient Hospital Stay: Admit: 2020-10-14 | Discharge: 2020-10-14 | Payer: PRIVATE HEALTH INSURANCE | Primary: Internal Medicine

## 2020-10-14 DIAGNOSIS — Z7901 Long term (current) use of anticoagulants: Secondary | ICD-10-CM

## 2020-10-14 DIAGNOSIS — K559 Vascular disorder of intestine, unspecified: Secondary | ICD-10-CM

## 2020-10-14 DIAGNOSIS — D6862 Lupus anticoagulant syndrome: Secondary | ICD-10-CM

## 2020-10-14 DIAGNOSIS — Z5181 Encounter for therapeutic drug level monitoring: Secondary | ICD-10-CM

## 2020-10-14 LAB — PROTIME AND INR
BKR INR: 2.79 x 1000/??L — ABNORMAL HIGH (ref 0.91–1.05)
BKR PROTHROMBIN TIME: 28.2 seconds — ABNORMAL HIGH (ref 10.0–11.6)

## 2020-10-14 NOTE — Progress Notes
Pharmacist Anticoagulation Clinic Telephone Visit Follow Maria [name] (February 21, 1951) is on chronic anticoagulation for the diagnosis of  Lupus anticoagulant with hypercoagulable state (HC Code) (HC CODE) (HC Code)  (primary encounter diagnosis) Ischemic colitis (HC Code) with an INR goal of 2.0-3.0.  The patient was contacted regarding lab drawn INR results.  Patient's INR findings and prescribed dose from last visit:Anticoagulation Monitoring Latest Ref Rng & Units 09/18/2020 10/02/2020 10/14/2020 INR 2 - 3 - - - INR 0.91 - 1.10 - - - INR (External) 0.8 - 1.15 - - - Assoc. INR Date - 09/18/2020 10/02/2020 10/14/2020 Associated INR - 1.84 2.93 2.79 Instructions - 7.5 mg every Sun, Tue, Fri; 5 mg all other days 7.5 mg every Sun, Tue, Fri; 5 mg all other days 7.5 mg every Sun, Tue, Fri; 5 mg all other days Last 7 day dose total - 40 mg 42.5 mg 42.5 mg Next INR Date - 09/25/2020 10/09/2020 10/29/2020 The following findings were reported from today's call (no findings if none selected):[]  Missed doses []  Extra doses []  Change in medications []  Change in vitamin k rich food intake (green vegetables, herbals, etc) []  Change in alcohol use []  Change in overall health []  Recent hospitalization / emergency department visit []  Upcoming procedure (including dental procedures) []  Other Comments:       No findings The following side effects were reported from today's call (no side effects if none selected):[]  Bleeding []  New bruising []  Warfarin-emergency department visit or hospital admission []  Other Comments:   No side effects Assessment/Plan:Patient's INR is 2.79, which is within goal range of 2.0-3.0.  She was instructed to continue current weekly dose as indicated in the maintenance plan below.  Next INR assessment due 2 weeks.  Anticoagulation Summary  As of 10/14/2020  INR goal:  2.0-3.0 TTR:  46.0 % (6.4 y) INR used for dosing:  2.79 (10/14/2020) Warfarin maintenance plan:  7.5 mg (5 mg x 1.5) every Sun, Tue, Fri; 5 mg (5 mg x 1) all other days Weekly warfarin total:  42.5 mg Plan last modified:  Markus [place], [facility] (09/18/2020) Next INR check:  10/29/2020 Priority:  High Priority Target end date:  Indefinite  Indications  Lupus anticoagulant with hypercoagulable state (HC Code) (HC CODE) (HC Code) [D68.62]Ischemic colitis (HC Code) [K55.9]   Anticoagulation Episode Summary   INR check location:    Preferred lab:    Send INR reminders to:  [facility] [place] INR POOL  Comments:    Anticoagulation Care Providers   Provider Role Specialty Phone number  [name], MD Referring Medical Oncology [phone]  [place], [name], APRN Responsible Medical Oncology [phone]  All patient's medications have been reviewed and updated as needed.  Electronically Signed by Lia Hopping, PharmD, October 14, 2020 [facility] Anticoagulation Clinic  [address], [place], [place] [address]  Phone:[phone]  Fax: [phone]

## 2020-10-20 ENCOUNTER — Encounter: Admit: 2020-10-20 | Payer: PRIVATE HEALTH INSURANCE | Attending: Medical Oncology | Primary: Internal Medicine

## 2020-10-21 MED ORDER — WARFARIN 5 MG TABLET
5 mg | ORAL_TABLET | 1 refills | Status: AC
Start: 2020-10-21 — End: 2020-10-23

## 2020-10-22 ENCOUNTER — Encounter: Admit: 2020-10-22 | Payer: PRIVATE HEALTH INSURANCE | Attending: Medical Oncology | Primary: Internal Medicine

## 2020-10-23 MED ORDER — WARFARIN 5 MG TABLET
5 mg | ORAL_TABLET | 1 refills | Status: AC
Start: 2020-10-23 — End: 2020-12-06

## 2020-10-24 ENCOUNTER — Encounter: Admit: 2020-10-24 | Payer: PRIVATE HEALTH INSURANCE | Primary: Internal Medicine

## 2020-10-24 ENCOUNTER — Telehealth: Admit: 2020-10-24 | Payer: PRIVATE HEALTH INSURANCE | Attending: Ophthalmology | Primary: Internal Medicine

## 2020-10-24 DIAGNOSIS — H1013 Acute atopic conjunctivitis, bilateral: Secondary | ICD-10-CM

## 2020-10-24 MED ORDER — OLOPATADINE 0.2 % EYE DROPS
0.2 % | Freq: Every day | OPHTHALMIC | 7 refills | Status: AC
Start: 2020-10-24 — End: 2021-05-26

## 2020-10-24 NOTE — Telephone Encounter
Please triage, suspect of fbs

## 2020-10-24 NOTE — Telephone Encounter
Caller:   SofiaCaller's relationship to patient: selfCalling from (pharmacy, hospital, agency, etc.):  naReason for call:   Last seen  05/23/2019, patient wants appointment, has new problem -  She was outside gardening recently and flushed her eye with OTC solution.  Does not recall any instance of anything getting into her eye.If not feeling well, what are symptoms:  Left eye feels sandy, very red and tearing a lot .[name]  Not in painIf having symptoms, how long have the symptoms been present:  2 daysBest telephone number for callback:   [phone] time to return call:   anyPermission to leave message: yesAllison AYM Care CenterOphthalmology Lead Scheduler

## 2020-10-24 NOTE — Telephone Encounter
Spoke w/ pt:  c/o FBS/sandy w/ itching, redness and swelling (OS) x 3 days; slight blurriness but denies discharge and light sensitivity; no recent traumas;  informed patient to continue Pataday per last notes, start AT 4-6x/day, warm compress and lid hygiene; c/b if no improvement or worsening symptoms unless MD states otherwise**Hi CCCan we please schedule next avail w/ Dr Betsy Coder; thank you

## 2020-10-27 ENCOUNTER — Emergency Department: Admit: 2020-10-27 | Payer: PRIVATE HEALTH INSURANCE | Primary: Internal Medicine

## 2020-10-27 ENCOUNTER — Inpatient Hospital Stay: Admit: 2020-10-27 | Discharge: 2020-10-27 | Payer: PRIVATE HEALTH INSURANCE | Attending: Emergency Medicine

## 2020-10-27 ENCOUNTER — Encounter: Admit: 2020-10-27 | Payer: PRIVATE HEALTH INSURANCE | Primary: Internal Medicine

## 2020-10-27 DIAGNOSIS — Z881 Allergy status to other antibiotic agents status: Secondary | ICD-10-CM

## 2020-10-27 DIAGNOSIS — E7800 Pure hypercholesterolemia, unspecified: Secondary | ICD-10-CM

## 2020-10-27 DIAGNOSIS — Z7984 Long term (current) use of oral hypoglycemic drugs: Secondary | ICD-10-CM

## 2020-10-27 DIAGNOSIS — Z7901 Long term (current) use of anticoagulants: Secondary | ICD-10-CM

## 2020-10-27 DIAGNOSIS — L719 Rosacea, unspecified: Secondary | ICD-10-CM

## 2020-10-27 DIAGNOSIS — K56609 Unspecified intestinal obstruction, unspecified as to partial versus complete obstruction: Secondary | ICD-10-CM

## 2020-10-27 DIAGNOSIS — D6862 Lupus anticoagulant syndrome: Secondary | ICD-10-CM

## 2020-10-27 DIAGNOSIS — K55039 Acute (reversible) ischemia of large intestine, extent unspecified: Secondary | ICD-10-CM

## 2020-10-27 DIAGNOSIS — Z885 Allergy status to narcotic agent status: Secondary | ICD-10-CM

## 2020-10-27 DIAGNOSIS — E119 Type 2 diabetes mellitus without complications: Secondary | ICD-10-CM

## 2020-10-27 DIAGNOSIS — H1013 Acute atopic conjunctivitis, bilateral: Secondary | ICD-10-CM

## 2020-10-27 DIAGNOSIS — R2242 Localized swelling, mass and lump, left lower limb: Secondary | ICD-10-CM

## 2020-10-27 DIAGNOSIS — Z79899 Other long term (current) drug therapy: Secondary | ICD-10-CM

## 2020-10-27 DIAGNOSIS — R42 Dizziness and giddiness: Secondary | ICD-10-CM

## 2020-10-27 DIAGNOSIS — K436 Other and unspecified ventral hernia with obstruction, without gangrene: Secondary | ICD-10-CM

## 2020-10-27 DIAGNOSIS — M25572 Pain in left ankle and joints of left foot: Secondary | ICD-10-CM

## 2020-10-27 DIAGNOSIS — Z91040 Latex allergy status: Secondary | ICD-10-CM

## 2020-10-27 DIAGNOSIS — F32A Depression: Secondary | ICD-10-CM

## 2020-10-27 DIAGNOSIS — S93402A Sprain of unspecified ligament of left ankle, initial encounter: Secondary | ICD-10-CM

## 2020-10-27 MED ORDER — TRAMADOL 50 MG TABLET
50 mg | ORAL_TABLET | ORAL | 1 refills | Status: SS | PRN
Start: 2020-10-27 — End: 2021-03-07

## 2020-10-27 NOTE — Discharge Instructions
Return if worseFollow-up PCP regarding asymptomatic hypertension 2 to 3 days.

## 2020-10-27 NOTE — ED Notes
11:45 AM Chief Complaint Patient presents with ? Ankle Pain   Patient reports twisting left ankle yesterday, mis-stepped. +swelling. Denied previous injury. Took tylenol last night.  Vitals:  10/27/20 1134 BP: (!) 141/76 Pulse: 81 Resp: 19 Temp: 98.3 ?F (36.8 ?C) Pt twisted left ankle yesterday early evening. Pt took tylenol wo effect last night. 12:34 PMPt given crutches per provider order. Pt educated on use and height. Pt able to demonstrate proper use.

## 2020-10-30 ENCOUNTER — Telehealth: Admit: 2020-10-30 | Payer: PRIVATE HEALTH INSURANCE | Primary: Internal Medicine

## 2020-10-30 ENCOUNTER — Encounter: Admit: 2020-10-30 | Payer: PRIVATE HEALTH INSURANCE | Attending: Surgery of the Hand | Primary: Internal Medicine

## 2020-10-30 ENCOUNTER — Telehealth: Admit: 2020-10-30 | Payer: PRIVATE HEALTH INSURANCE | Attending: Internal Medicine | Primary: Internal Medicine

## 2020-10-30 NOTE — Telephone Encounter
PHARMACY NOTE: INR REMINDER MISSED CALL 	I have left a voicemail for [name] with a reminder that patient is due for an INR lab test based on the referral for anticoagulation monitoring. This is the first week of missed calls. I have also notified the ambulatory care pharmacist   as appropriate.Malvin Johns, CPHTAmbulatory Care Pharmacy Technician [facility] Anticoagulation and [facility] Primary CarePharmacy Department7/6/202211:18 AM

## 2020-10-30 NOTE — Telephone Encounter
Ed follow up

## 2020-10-30 NOTE — Telephone Encounter
Patient needs to follow up with her PCP and be referred if necessary.

## 2020-11-04 ENCOUNTER — Ambulatory Visit
Admit: 2020-11-04 | Payer: PRIVATE HEALTH INSURANCE | Attending: Pharmacist Clinician (PhC)/ Clinical Pharmacy Specialist | Primary: Internal Medicine

## 2020-11-04 ENCOUNTER — Telehealth
Admit: 2020-11-04 | Payer: PRIVATE HEALTH INSURANCE | Attending: Pharmacist Clinician (PhC)/ Clinical Pharmacy Specialist | Primary: Internal Medicine

## 2020-11-04 ENCOUNTER — Inpatient Hospital Stay: Admit: 2020-11-04 | Discharge: 2020-11-04 | Payer: PRIVATE HEALTH INSURANCE | Primary: Internal Medicine

## 2020-11-04 DIAGNOSIS — K559 Vascular disorder of intestine, unspecified: Secondary | ICD-10-CM

## 2020-11-04 DIAGNOSIS — Z5181 Encounter for therapeutic drug level monitoring: Secondary | ICD-10-CM

## 2020-11-04 DIAGNOSIS — D6862 Lupus anticoagulant syndrome: Secondary | ICD-10-CM

## 2020-11-04 DIAGNOSIS — Z7901 Long term (current) use of anticoagulants: Secondary | ICD-10-CM

## 2020-11-04 NOTE — Progress Notes
Pharmacist Anticoagulation Clinic Telephone Visit Follow Maria [name] ([birth year]) is on chronic anticoagulation for the diagnosis of  Lupus anticoagulant with hypercoagulable state (HC Code) (HC CODE) (HC Code)  (primary encounter diagnosis) Ischemic colitis (HC Code) with an INR goal of 2.0-3.0.  The patient and family was contacted regarding lab drawn INR results.  Patient's INR findings and prescribed dose from last visit:Anticoagulation Monitoring Latest Ref Rng & Units 10/02/2020 10/14/2020 11/04/2020 INR 2 - 3 - - - INR 0.91 - 1.10 - - - INR (External) 0.8 - 1.15 - - - Assoc. INR Date - 10/02/2020 10/14/2020 11/04/2020 Associated INR - 2.93 2.79 5.08 Instructions - 7.5 mg every Sun, Tue, Fri; 5 mg all other days 7.5 mg every Sun, Tue, Fri; 5 mg all other days 7/11: Hold; 7/12: Hold; Otherwise 7.5 mg every Sun, Tue, Fri; 5 mg all other days Last 7 day dose total - 42.5 mg 42.5 mg 42.5 mg Next INR Date - 10/09/2020 10/29/2020 11/06/2020 The following findings were reported from today's call (no findings if none selected):[]  Missed doses []  Extra doses [x]  Change in medications []  Change in vitamin k rich food intake (green vegetables, herbals, etc) []  Change in alcohol use [x]  Change in overall health [x]  Recent hospitalization / emergency department visit []  Upcoming procedure (including dental procedures) []  Other Comments:       7/3 ED visit for sprained ankle; taking Tylenol, less physically active  The following side effects were reported from today's call (no side effects if none selected):[]  Bleeding []  New bruising []  Warfarin-emergency department visit or hospital admission []  Other Comments:   Unable to assess, spoke to patient's husband  Assessment/Plan:Patient's INR is 5.08, which is above goal range of 2.0-3.0.  Called patient twice and left voice message on her phone (she is usually unable to retrieve vm's from her phone). Spoke to her husband, who asked me to call back patient in 30 min, otherwise to call him again. Husband was given instructions for Ms. Maahs to hold 2 doses of warfarin as indicated in the maintenance plan below. Next INR assessment due 2 days. This information was routed to the covering provider for review.Anticoagulation Summary  As of 11/04/2020  INR goal:  2.0-3.0 TTR:  45.6 % (6.4 y) INR used for dosing:  5.08 (11/04/2020) Warfarin maintenance plan:  7.5 mg (5 mg x 1.5) every Sun, Tue, Fri; 5 mg (5 mg x 1) all other days Weekly warfarin total:  42.5 mg Plan last modified:  Markus [place], [facility] (09/18/2020) Next INR check:  11/06/2020 Priority:  High Priority Target end date:  Indefinite  Indications  Lupus anticoagulant with hypercoagulable state (HC Code) (HC CODE) (HC Code) [D68.62]Ischemic colitis (HC Code) [K55.9]   Anticoagulation Episode Summary   INR check location:    Preferred lab:    Send INR reminders to:  [facility] [place] INR POOL  Comments:    Anticoagulation Care Providers   Provider Role Specialty Phone number  [name], MD Referring Medical Oncology [phone]  [place], [name], APRN Responsible Medical Oncology [phone]  All patient's medications have been reviewed and updated as needed.  Electronically Signed by [name], PharmD, November 04, 2020 [facility] Anticoagulation Clinic  [address] [place], [place] [address]  [phone]  Fax: [phone]

## 2020-11-04 NOTE — Telephone Encounter
Received call from patient she is looking for a call back from Nadya to discuss her INR. Please contact patient to discuss [phone].

## 2020-11-06 ENCOUNTER — Telehealth
Admit: 2020-11-06 | Payer: PRIVATE HEALTH INSURANCE | Attending: Pharmacist Clinician (PhC)/ Clinical Pharmacy Specialist | Primary: Internal Medicine

## 2020-11-06 NOTE — Telephone Encounter
Tried calling patient again to confirm she received instructions for her blood thinner therapy. Phone went directly to voice message. [name], PharmD, BCPSClinical Pharmacist II, Anticoagulation ClinicSmilow Tr[facility] and [facility]

## 2020-11-07 ENCOUNTER — Ambulatory Visit
Admit: 2020-11-07 | Payer: PRIVATE HEALTH INSURANCE | Attending: Pharmacist Clinician (PhC)/ Clinical Pharmacy Specialist | Primary: Internal Medicine

## 2020-11-07 ENCOUNTER — Inpatient Hospital Stay: Admit: 2020-11-07 | Discharge: 2020-11-07 | Payer: PRIVATE HEALTH INSURANCE | Primary: Internal Medicine

## 2020-11-07 DIAGNOSIS — D6862 Lupus anticoagulant syndrome: Secondary | ICD-10-CM

## 2020-11-07 DIAGNOSIS — K559 Vascular disorder of intestine, unspecified: Secondary | ICD-10-CM

## 2020-11-07 DIAGNOSIS — Z5181 Encounter for therapeutic drug level monitoring: Secondary | ICD-10-CM

## 2020-11-07 DIAGNOSIS — Z7901 Long term (current) use of anticoagulants: Secondary | ICD-10-CM

## 2020-11-07 LAB — PROTIME AND INR
BKR INR: 1.46 x 1000/??L — ABNORMAL HIGH (ref 0.87–1.14)
BKR PROTHROMBIN TIME: 15.3 seconds — ABNORMAL HIGH (ref 9.6–12.3)

## 2020-11-07 NOTE — Progress Notes
Pharmacist Anticoagulation Clinic Telephone Visit Follow Maria [name] (10-10-1950) is on chronic anticoagulation for the diagnosis of  Lupus anticoagulant with hypercoagulable state (HC Code) (HC CODE) (HC Code)  (primary encounter diagnosis) Ischemic colitis (HC Code) with an INR goal of 2.0-3.0.  The patient was contacted regarding lab drawn INR results.  Patient's INR findings and prescribed dose from last visit:Anticoagulation Monitoring Latest Ref Rng & Units 10/14/2020 11/04/2020 11/07/2020 INR 2 - 3 - - - INR 0.91 - 1.10 - - - INR (External) 0.8 - 1.15 - - - Assoc. INR Date - 10/14/2020 11/04/2020 11/07/2020 Associated INR - 2.79 5.08 1.46 Instructions - 7.5 mg every Sun, Tue, Fri; 5 mg all other days 7/11: Hold; 7/12: Hold; Otherwise 7.5 mg every Sun, Tue, Fri; 5 mg all other days 7/14: 7.5 mg; Otherwise 7.5 mg every Sun, Tue, Fri; 5 mg all other days Last 7 day dose total - 42.5 mg 42.5 mg 30 mg Next INR Date - 10/29/2020 11/06/2020 11/11/2020 The following findings were reported from today's call (no findings if none selected):[]  Missed doses []  Extra doses [x]  Change in medications []  Change in vitamin k rich food intake (green vegetables, herbals, etc) []  Change in alcohol use []  Change in overall health []  Recent hospitalization / emergency department visit []  Upcoming procedure (including dental procedures) [x]  Other Comments:       Has been taking acetaminophen 1000 mg 3x/day The following side effects were reported from today's call (no side effects if none selected):[]  Bleeding []  New bruising []  Warfarin-emergency department visit or hospital admission []  Other Comments:   No side effects Assessment/Plan:Patient's INR is 1.46, which is below goal range of 2.0-3.0. Patient has been taking acetaminophen 1000 mg every 4-6 hrs 3x/day since she sprained her ankle on 6/26. She stopped taking acetaminophen for 2 days. Has appt with orthopedics on 7/26. She was instructed to take warfarin 7.5 mg today and 5 mg all other days and resume acetaminophen since she requires it for pain control as indicated in the maintenance plan below. Will plan to keep her weekly dose 17% lower than previously while she is taking >2g/day of acetaminophen. Next INR assessment due in 5 days. This information was routed to the covering provider for review. [facility] home draw ordered for 7/19 since patient is on crutches for sprained ankle. HOMEBOUND LAB DRAW ASSESSMENT / ATTESTATIONPatient Information:Do you have a medical condition or situation that impedes your ability to leave home:  Patient sprained her ankle and is requiring use of crutches/caneIf you left your home would you require assistance?  yes, the patient reports using crutches and cane when leaving their homeAssessment:Criteria 1: The patient requires a home draw because they require assistance from crutches, cane and assistance of another person in order to leave their place of residence Criteria 2: Due to ankle injury the patient has a normal inability to leave home and leaving home requires a considerable and taxing effort.Recommendation:The patient requires a home draw and meets the necessary requirements. Anticoagulation Summary  As of 11/07/2020  INR goal:  2.0-3.0 TTR:  45.6 % (6.4 y) INR used for dosing:  1.46 (11/07/2020) Warfarin maintenance plan:  7.5 mg (5 mg x 1.5) every Sun, Tue, Fri; 5 mg (5 mg x 1) all other days Weekly warfarin total:  42.5 mg Plan last modified:  Markus D[place], [facility] (09/18/2020) Next INR check:  11/11/2020 Priority:  High Priority Target end date:  Indefinite  Indications  Lupus anticoagulant with hypercoagulable state (HC Code) (HC  CODE) (HC Code) [D68.62]Ischemic colitis (HC Code) [K55.9]   Anticoagulation Episode Summary   INR check location:    Preferred lab:    Send INR reminders to:  [facility] [place] INR POOL  Comments:    Anticoagulation Care Providers   Provider Role Specialty Phone number  [name], MD Referring Medical Oncology [phone]  Florian Buff [name], APRN Responsible Medical Oncology [phone]  All patient's medications have been reviewed and updated as needed.  Electronically Signed by [name], PharmD, November 07, 2020 [facility] Anticoagulation Clinic  [address], [place], [place] [address]  Phone:[phone]  Fax: [phone]

## 2020-11-11 ENCOUNTER — Inpatient Hospital Stay
Admit: 2020-11-11 | Discharge: 2020-11-16 | Payer: PRIVATE HEALTH INSURANCE | Source: Home / Self Care | Attending: Surgery | Admitting: Cytopathology

## 2020-11-12 ENCOUNTER — Emergency Department: Admit: 2020-11-12 | Payer: PRIVATE HEALTH INSURANCE | Primary: Internal Medicine

## 2020-11-12 ENCOUNTER — Inpatient Hospital Stay: Admit: 2020-11-12 | Payer: PRIVATE HEALTH INSURANCE

## 2020-11-12 ENCOUNTER — Telehealth: Admit: 2020-11-12 | Payer: PRIVATE HEALTH INSURANCE | Attending: Medical Oncology | Primary: Internal Medicine

## 2020-11-12 DIAGNOSIS — K56609 Unspecified intestinal obstruction, unspecified as to partial versus complete obstruction: Secondary | ICD-10-CM

## 2020-11-12 LAB — CBC WITH AUTO DIFFERENTIAL
BKR WAM ABSOLUTE IMMATURE GRANULOCYTES.: 0.05 x 1000/ÂµL (ref 0.00–0.30)
BKR WAM ABSOLUTE LYMPHOCYTE COUNT.: 1.46 x 1000/ÂµL (ref 0.60–3.70)
BKR WAM ABSOLUTE NRBC (2 DEC): 0 x 1000/ÂµL (ref 0.00–1.00)
BKR WAM ANALYZER ANC: 10.47 x 1000/ÂµL — ABNORMAL HIGH (ref 2.00–7.60)
BKR WAM BASOPHIL ABSOLUTE COUNT.: 0.05 x 1000/ÂµL (ref 0.00–1.00)
BKR WAM BASOPHILS: 0.4 % (ref 0.0–1.4)
BKR WAM EOSINOPHIL ABSOLUTE COUNT.: 0.14 x 1000/ÂµL (ref 0.00–1.00)
BKR WAM EOSINOPHILS: 1.1 % (ref 0.0–5.0)
BKR WAM HEMATOCRIT (2 DEC): 41.6 % (ref 35.00–45.00)
BKR WAM HEMOGLOBIN: 13.4 g/dL (ref 11.7–15.5)
BKR WAM IMMATURE GRANULOCYTES: 0.4 % (ref 0.0–1.0)
BKR WAM LYMPHOCYTES: 11.4 % — ABNORMAL LOW (ref 17.0–50.0)
BKR WAM MCH (PG): 26.4 pg — ABNORMAL LOW (ref 27.0–33.0)
BKR WAM MCHC: 32.2 g/dL (ref 31.0–36.0)
BKR WAM MCV: 81.9 fL (ref 80.0–100.0)
BKR WAM MONOCYTE ABSOLUTE COUNT.: 0.63 x 1000/ÂµL (ref 0.00–1.00)
BKR WAM MONOCYTES: 4.9 % (ref 4.0–12.0)
BKR WAM MPV: 10.7 fL (ref 8.0–12.0)
BKR WAM NEUTROPHILS: 81.8 % — ABNORMAL HIGH (ref 39.0–72.0)
BKR WAM NUCLEATED RED BLOOD CELLS: 0 % (ref 0.0–1.0)
BKR WAM PLATELETS: 332 x1000/ÂµL (ref 150–420)
BKR WAM RDW-CV: 15.8 % — ABNORMAL HIGH (ref 11.0–15.0)
BKR WAM RED BLOOD CELL COUNT.: 5.08 M/ÂµL (ref 4.00–6.00)
BKR WAM WHITE BLOOD CELL COUNT: 12.8 x1000/ÂµL — ABNORMAL HIGH (ref 4.0–11.0)

## 2020-11-12 LAB — BASIC METABOLIC PANEL
BKR ANION GAP: 11 (ref 7–17)
BKR BLOOD UREA NITROGEN: 14 mg/dL (ref 8–23)
BKR BUN / CREAT RATIO: 18.9 (ref 8.0–23.0)
BKR CALCIUM: 9.5 mg/dL (ref 8.8–10.2)
BKR CHLORIDE: 100 mmol/L (ref 98–107)
BKR CO2: 25 mmol/L (ref 20–30)
BKR CREATININE: 0.74 mg/dL (ref 0.40–1.30)
BKR EGFR (AFR AMER): >60 mL/min/{1.73_m2} (ref >60)
BKR EGFR (NON AFRICAN AMERICAN): >60 mL/min/{1.73_m2} (ref >60)
BKR GLUCOSE: 198 mg/dL — ABNORMAL HIGH (ref 70–100)
BKR POTASSIUM: 4.7 mmol/L (ref 3.3–5.3)
BKR SODIUM: 136 mmol/L (ref 136–144)

## 2020-11-12 LAB — HEPATIC FUNCTION PANEL
BKR A/G RATIO: 1.2 (ref 1.0–2.2)
BKR ALANINE AMINOTRANSFERASE (ALT): 18 U/L (ref 10–35)
BKR ALBUMIN: 4.1 g/dL (ref 3.6–4.9)
BKR ALKALINE PHOSPHATASE: 123 U/L — ABNORMAL HIGH (ref 9–122)
BKR ASPARTATE AMINOTRANSFERASE (AST): 27 U/L (ref 10–35)
BKR AST/ALT RATIO: 1.5
BKR BILIRUBIN DIRECT: <0.2 mg/dL (ref <=0.3)
BKR BILIRUBIN TOTAL: 0.2 mg/dL (ref <=1.2)
BKR GLOBULIN: 3.5 g/dL (ref 2.3–3.5)
BKR PROTEIN TOTAL: 7.6 g/dL (ref 6.6–8.7)

## 2020-11-12 LAB — PROTIME AND INR
BKR INR: 2.22 — ABNORMAL HIGH (ref 0.87–1.14)
BKR PROTHROMBIN TIME: 22.4 s — ABNORMAL HIGH (ref 9.6–12.3)

## 2020-11-12 LAB — LIPASE: BKR LIPASE: 25 U/L (ref 11–55)

## 2020-11-12 LAB — SARS COV-2 (COVID-19) RNA: BKR SARS-COV-2 RNA (COVID-19) (YH): NEGATIVE

## 2020-11-12 MED ORDER — BARIUM SULFATE 2 % (W/V) ORAL SUSPENSION
2 % (w/v) | Freq: Once | ORAL | Status: DC | PRN
Start: 2020-11-12 — End: 2020-11-12

## 2020-11-12 MED ORDER — SODIUM CHLORIDE 0.9 % LARGE VOLUME SYRINGE FOR AUTOINJECTOR
Freq: Once | INTRAVENOUS | Status: DC | PRN
Start: 2020-11-12 — End: 2020-11-12

## 2020-11-12 MED ORDER — ONDANSETRON HCL (PF) 4 MG/2 ML INJECTION SOLUTION
4 mg/2 mL | Freq: Once | INTRAVENOUS | Status: CP
Start: 2020-11-12 — End: ?
  Administered 2020-11-12: 04:00:00 4 mL via INTRAVENOUS

## 2020-11-12 MED ORDER — IOHEXOL 350 MG IODINE/ML INTRAVENOUS SOLUTION
350 mg iodine/mL | Freq: Once | INTRAVENOUS | Status: CP | PRN
Start: 2020-11-12 — End: ?
  Administered 2020-11-12: 09:00:00 350 mL via INTRAVENOUS

## 2020-11-12 MED ORDER — DEXTROSE 10 % IV BOLUS FOR ORDERABLE
INTRAVENOUS | Status: DC | PRN
Start: 2020-11-12 — End: 2020-11-16

## 2020-11-12 MED ORDER — MORPHINE 4 MG/ML INTRAVENOUS SOLUTION
4 mg/mL | INTRAVENOUS | Status: DC | PRN
Start: 2020-11-12 — End: 2020-11-12

## 2020-11-12 MED ORDER — HYDROMORPHONE 0.5 MG/0.5 ML INJECTION SYRINGE
0.5 mg/ mL | INTRAVENOUS | Status: DC | PRN
Start: 2020-11-12 — End: 2020-11-16
  Administered 2020-11-13 – 2020-11-15 (×10): 0.5 mL via INTRAVENOUS

## 2020-11-12 MED ORDER — SODIUM CHLORIDE 0.9 % (FLUSH) INJECTION SYRINGE
0.9 % | INTRAVENOUS | Status: DC | PRN
Start: 2020-11-12 — End: 2020-11-16

## 2020-11-12 MED ORDER — ONDANSETRON HCL (PF) 4 MG/2 ML INJECTION SOLUTION
42 mg/2 mL | Freq: Once | INTRAVENOUS | Status: CP
Start: 2020-11-12 — End: ?
  Administered 2020-11-13: 01:00:00 4 mL via INTRAVENOUS

## 2020-11-12 MED ORDER — HYDROMORPHONE 0.5 MG/0.5 ML INJECTION SYRINGE
0.5 mg/ mL | INTRAVENOUS | Status: DC | PRN
Start: 2020-11-12 — End: 2020-11-14

## 2020-11-12 MED ORDER — MORPHINE 4 MG/ML INTRAVENOUS SOLUTION
4 mg/mL | INTRAVENOUS | Status: DC | PRN
Start: 2020-11-12 — End: 2020-11-12
  Administered 2020-11-12 (×2): 4 mL via INTRAVENOUS

## 2020-11-12 MED ORDER — GLUCAGON 1 MG/ML IN STERILE WATER
Freq: Once | INTRAMUSCULAR | Status: DC | PRN
Start: 2020-11-12 — End: 2020-11-16

## 2020-11-12 MED ORDER — SODIUM CHLORIDE 0.9 % INTRAVENOUS SOLUTION
INTRAVENOUS | Status: DC
Start: 2020-11-12 — End: 2020-11-13
  Administered 2020-11-12 – 2020-11-13 (×2): via INTRAVENOUS

## 2020-11-12 MED ORDER — INSULIN LISPRO 100 UNIT/ML (SLIDING SCALE)
100 unit/mL | Freq: Four times a day (QID) | SUBCUTANEOUS | Status: DC
Start: 2020-11-12 — End: 2020-11-13

## 2020-11-12 MED ORDER — SKIM MILK
ORAL | Status: DC | PRN
Start: 2020-11-12 — End: 2020-11-12

## 2020-11-12 MED ORDER — MORPHINE 4 MG/ML INTRAVENOUS SOLUTION
4 mg/mL | Freq: Once | INTRAVENOUS | Status: CP
Start: 2020-11-12 — End: ?
  Administered 2020-11-12: 04:00:00 4 mL via INTRAVENOUS

## 2020-11-12 MED ORDER — SODIUM CHLORIDE 0.9 % (FLUSH) INJECTION SYRINGE
0.9 % | Freq: Three times a day (TID) | INTRAVENOUS | Status: DC
Start: 2020-11-12 — End: 2020-11-16
  Administered 2020-11-13 – 2020-11-15 (×5): 0.9 mL via INTRAVENOUS

## 2020-11-12 MED ORDER — MORPHINE 4 MG/ML INTRAVENOUS SOLUTION
4 mg/mL | Freq: Once | INTRAVENOUS | Status: CP
Start: 2020-11-12 — End: ?
  Administered 2020-11-12: 06:00:00 4 mL via INTRAVENOUS

## 2020-11-12 MED ORDER — FRUIT JUICE
ORAL | Status: DC | PRN
Start: 2020-11-12 — End: 2020-11-12

## 2020-11-12 MED ORDER — DEXTROSE 40 % ORAL GEL
40 % | ORAL | Status: DC | PRN
Start: 2020-11-12 — End: 2020-11-12

## 2020-11-12 MED ORDER — MORPHINE 4 MG/ML INTRAVENOUS SOLUTION
4 mg/mL | Freq: Once | INTRAVENOUS | Status: CP
Start: 2020-11-12 — End: ?
  Administered 2020-11-12: 07:00:00 4 mL via INTRAVENOUS

## 2020-11-12 MED ORDER — ONDANSETRON HCL (PF) 4 MG/2 ML INJECTION SOLUTION
42 mg/2 mL | Freq: Four times a day (QID) | INTRAVENOUS | Status: DC | PRN
Start: 2020-11-12 — End: 2020-11-16
  Administered 2020-11-12 – 2020-11-14 (×4): 4 mL via INTRAVENOUS

## 2020-11-12 MED ORDER — DIPHENHYDRAMINE 25 MG CAPSULE
25 mg | Freq: Once | ORAL | Status: CP
Start: 2020-11-12 — End: ?
  Administered 2020-11-12: 04:00:00 25 mg via ORAL

## 2020-11-12 MED ORDER — DIPHENHYDRAMINE 50 MG/ML INJECTION (WRAPPED E-RX)
50 mg/mL | Freq: Four times a day (QID) | INTRAVENOUS | Status: DC | PRN
Start: 2020-11-12 — End: 2020-11-12
  Administered 2020-11-12: 17:00:00 50 mL via INTRAVENOUS

## 2020-11-12 MED ORDER — HYDRALAZINE 20 MG/ML INJECTION SOLUTION
20 mg/mL | Freq: Once | INTRAVENOUS | Status: CP
Start: 2020-11-12 — End: ?
  Administered 2020-11-12: 22:00:00 20 mL via INTRAVENOUS

## 2020-11-12 MED ORDER — MORPHINE 4 MG/ML INTRAVENOUS SOLUTION
4 mg/mL | Freq: Once | INTRAVENOUS | Status: CP
Start: 2020-11-12 — End: ?
  Administered 2020-11-12: 11:00:00 4 mL via INTRAVENOUS

## 2020-11-12 NOTE — ED Notes
7:35 AM Report received, patient presented to ED with c/o abdominal pain. Hx of SBO. Pending IP surgery bed.7:46 AM Incontinence care provided. Ice chips given. 11:44 AM Patient c/o 708/10 abdominal pain. Patient with prn morphine, per chart patient allergic to morphine. Patient reports usually premedicated prior to admin. MD Eulis Canner aware, pending new orders. 12:46 PM Attempted to call provider x2, provider did not answer. 1:20 PM Patient medicated per MAR, premedicated with benadryl prior to IV Morphine admin.

## 2020-11-12 NOTE — Telephone Encounter
Received call from [facility] at [facility], she wanted to report that patient did not answer door or phone for her home draw today. Please call patient to reschedule [phone].

## 2020-11-12 NOTE — Plan of Care
Plan of Care Overview/ Patient Status    1700-1900Pt arrived to unit @1700 . A&Ox4, on RA, afebrile. NPO, IVF running. Elevated BP 190/80's, IV Hydralazine given, BP went down to 149/75, cont to montor. Tachycardic in the 90-115, tele monitor in use per resident orders. PA Tuzzuli recommended taking off tele due to sinus tach. Voided x1. No BM, +flatus. PIV patent. Skin otherwise intact. C/o some nausea, IV Zofran given w/+effect. IV Morphine given for abd pain w/some effect. Ambulates ind OOB, steady gait. Call light within reach. Please see flowsheets for details.

## 2020-11-12 NOTE — ED Notes
11:40 PM Pt reports difuse abdominal pain that started 3-4 hours ago. Pt reports profound nausea w/ no episodes of vomiting. PT reports extensive abd hx w/ several intestinal surgeries & several bowel obstructions. Per pt last obstruction June 2022. Pt appears very uncomfortable, pt BP noted to be elevated. Pt placed on cardiac monitor I IV established.Past Medical History: Diagnosis Date ? Acute ischemic colitis (HC Code) (HC CODE) (HC Code) 2015 ? Depression  ? Diabetes mellitus (HC Code) (HC CODE) (HC Code)  ? Hypercholesteremia  ? Lupus anticoagulant disorder (HC Code) (HC CODE) (HC Code)  ? Rosacea  ? Ventral hernia with bowel obstruction  ? Vertigo

## 2020-11-12 NOTE — H&P
[facility] Consult NoteConsult for: SBOConsult requested by: Tacy Dura, MDHPISofia [name] is a 70 y.o. 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 Abdominal pain was sudden and has been diffusely across all parts of her abdomen, no emesis, she had 2 normal, nonbloody, bowel movements yesterday: one at 5pm and one at 9pm after the pain started. She is passing flatus this morning but still having continued pain and occasional belching. In the ED, she is afebrile and HDS. WBC 12.8, INR 2.22. [place] with SBO and transition point likely related to adhesions along ventral abdominal wall in mid abdomen, similar to her recent admission for SBO in June. She has a known bowel containing ventral hernia which is inferior to her transition point and unrelated to her SBO. Patient has had multiple admissions in the past for SBO, most recently 09/30/2020-10/02/2020, managed nonoperatively.Of note, patient has had some adjustments to her Coumadin dosing recently after spraining her L ankle and started taking Tylenol frequently. Her dose was most recently adjusted on 11/07/2020 to 5 mg daily. She last took coumadin yesterday and plan was to reassess INR in Coumadin clinic on 11/12/2020. Reports L ankle is much better today.With regards to her surgical history, she has had distant Laparoscopic nissen fundoplication, ventral hernia repair with mesh, umbilical hernia repair with Dr. 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 [name]), ex-lap with extensive LOA & takedown of colostomy (01/2014, Dr. [name]), ex-lap and extensive LOA and ventral hernia repair with mesh for SBO (07/2014, Dr. [name]), ex-lap/repair of ventral hernia with mesh and component separation closure (11/2015, Drs. Barcewicz and Hildred Alamin), and most recently LOA with repair of ventral hernia with mesh for recurrent ventral hernia (03/2020, Drs. O'Brien & Deniece Portela). She has had multiple hospitalizations in the past for SBO and well known to our service. Allergies: She is allergic to morphine; biaxin  [clarithromycin]; and latex, natural rubber.PMH: She has a past medical history of Acute ischemic colitis (HC Code) (HC CODE) (HC Code) (2015), Depression, Diabetes mellitus (HC Code) (HC CODE) (HC Code), Hypercholesteremia, Lupus anticoagulant disorder (HC Code) (HC CODE) (HC Code), Rosacea, Ventral hernia with bowel obstruction, and Vertigo.PSH: She has a past surgical history that includes Knee ligament reconstruction (Right, 2004); Laparoscopic Nissen fundoplication; Umbilical hernia repair; Ventral hernia repair; Tubal ligation; THYROID BIOPSY; Colostomy (05/27/13); Sinus surgery; Abdominal adhesion surgery (2016); Blepharoplasty (Bilateral, 05/2016); Colostomy closure; and Hysteroscopy w/ polypectomy (06/18/2020).FH: Her family history includes Bladder cancer in her mother; Breast cancer in her cousin; Colon cancer in her cousin, father, and paternal aunt.ROSConstitutional: no fevers, chills nor weight lossHENT: No sore throat, no dysphagiaCV: No chest pain nor palpitationsResp: no cough nor SOBGI: yes nausea and abdominal pain, no vomitingGU: no dysuriaAll other systems negative except per HPI.VITALSTemp:  [96.7 ?F (35.9 ?C)] 96.7 ?F (35.9 ?C)Pulse:  [74-83] 74Resp:  [18] 18BP: (156-169)/(57-77) 156/57SpO2:  [99 %-100 %] 99 %Device (Oxygen Therapy): room airEXAMGen: Alert, NADCV: Regular rate, nl s1/s2Pulm: Non-labored breathing, CTA b/l, on room airAbd: soft, non distended, diffusely tender throughout, more so left of midline in LUQ, previous midline incision well healed, inferior ventral hernia palpable and reducible. Mild guarding when palpating in LUQ, no rebound, non peritoniticExt: No LE edema Skin: WWP, left ankle ROM normalLABSRecent Labs   07/18/222347 WBC  12.8* HGB 13.4 HCT 41.60 PLT 332 MCV 81.9 MONOCYTES 4.9  Recent Labs   07/18/222347 NA 136 K 4.7 CL 100 CO2 25 BUN 14 CREATININE 0.74 GLU 198* ANIONGAP 11 CALCIUM 9.5 Recent Labs   07/19/220244 LABPROT 22.4* INR 2.22* Recent Labs   07/18/222347 BILITOT 0.2 BILIDIR <0.2 AST 27 ALT 18 ALKPHOS 123* ALBUMIN 4.1 LIPASE 25 IMAGINGCT Abdomen Pelvis with IV Contrast without oral (BMI>25)Result Date: 7/19/2022Recurrent acute high-grade small bowel obstruction with a similar transition point along the ventral abdominal wall in the mid-abdomen, likely related to adhesions from prior hernia repair. Findings were discussed with Dr. Aldean Jewett at 4:45 am on 11/12/20. Report Initiated By:  Bertram Denver, MD Reported And Signed By: Dion Saucier, MD  [facility]   ASSESSMENT/PLANSofia [name] is a 70 y.o. 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 +Flatus, no emesis. INR 2.22 on admission.?	Admit under Dr. Deniece Portela to the floor?	NPO/IVF (NS given DM history and BS 198)?	Q6 sugar checks, ISS?	Pain control w IV morphine: chart says patient allergic but she has tolerated it in the past without issues during her previous admissions?	Hold coumadin and any DVT ppx for now?	Restart DVT Ppx once INR <2?	Hold home meds?	Daily labs including INR?	NGT if emesis?	Await return of bowel function?	Serial abdominal examsSeen and discussed with Dr. Deniece Portela (Attending).Signed:Ferd Hibbs, MD7/19/20226:22 AMFor questions, please page:[phone] saw and examined the patient with Dr. Chipper Herb and I agree with the history, physical assessment and plan. Briefly, 70 yo woman well known to me presents with mid abd pain starting yest.  She hadn't eaten unusually.  +nausea, no emesis.  +BMs yest.avssabd soft,mod disten, tender w/o guarding LLQ, reducible hernia lower abd, no massCT, labs reviewedImp- recurrent SBO (unrelated to ventral hernia); no evidence of strangulationPlan- bowel rest; NG if increased nausea or emesisFuture eval of SB w/ enterography or SBFTHold coumadin while NPO; therapeutic Lovenox when INR <1.5

## 2020-11-12 NOTE — Utilization Review (ED)
UM Status: UR/MD agree on IP status Dr. Georges Lynch for a high grade small bowel obstruction.

## 2020-11-12 NOTE — ED Provider Notes
HistoryChief Complaint Patient presents with ? Ankle Pain   Patient reports twisting left ankle yesterday, mis-stepped. +swelling. Denied previous injury. Took tylenol last night.   Patient was walking twisted her ankle yesterday.  Patient with pain and tenderness over the lateral malleolus.  No pain or tenderness over the Achilles tendon.  Ankle stable.  2+ dorsalis pedis pulses intact.  Skin intact.  No neurologic vascular compromise noted.The history is provided by the patient. No language interpreter was used. Ankle PainLocation:  AnkleTime since incident:  1 dayInjury: no  Ankle location:  L anklePain details:   Quality:  Dull  Radiates to:  Does not radiate  Severity:  Mild  Onset quality:  Gradual  Duration:  1 dayDislocation: no  Foreign body present:  No foreign bodiesPrior injury to area:  NoAssociated symptoms: no back pain, no fever, no itching, no neck pain, no numbness, no swelling and no tingling   Past Medical History: Diagnosis Date ? Acute ischemic colitis (HC Code) (HC CODE) (HC Code) 2015 ? Depression  ? Diabetes mellitus (HC Code) (HC CODE) (HC Code)  ? Hypercholesteremia  ? Lupus anticoagulant disorder (HC Code) (HC CODE) (HC Code)  ? Rosacea  ? Ventral hernia with bowel obstruction  ? Vertigo  Past Surgical History: Procedure Laterality Date ? ABDOMINAL ADHESION SURGERY  2016 ? BLEPHAROPLASTY Bilateral 05/2016 ? COLOSTOMY  05/27/13 ? COLOSTOMY CLOSURE   ? HYSTEROSCOPY W/ POLYPECTOMY  06/18/2020 ? KNEE LIGAMENT RECONSTRUCTION Right 2004 ? LAPAROSCOPIC NISSEN FUNDOPLICATION    2000 ? ? SINUS SURGERY   ? THYROID BIOPSY   ? TUBAL LIGATION   ? UMBILICAL HERNIA REPAIR   ? VENTRAL HERNIA REPAIR   Family History Problem Relation Age of Onset ? Bladder cancer Mother  ? Colon cancer Paternal Aunt  ? Colon cancer Cousin       died of colona cancer at age 74 ? Breast cancer Cousin 2 cousins with breast cancer ? Colon cancer Father  Social History Socioeconomic History ? Marital status: Married Tobacco Use ? Smoking status: Never Smoker ? Smokeless tobacco: Never Used Vaping Use ? Vaping Use: Never used Substance and Sexual Activity ? Alcohol use: Not Currently ? Drug use: No ? Sexual activity: Yes   Partners: Male ED Other Social History ? E-cigarette status Never User  ? E-Cigarette Use Never User  E-cigarette/Vaping Substances E-cigarette/Vaping Devices Review of Systems Constitutional: Negative for diaphoresis and fever. Eyes: Negative for discharge. Respiratory: Negative for chest tightness and shortness of breath.  Gastrointestinal: Negative for vomiting. Genitourinary: Negative for flank pain, genital sores and pelvic pain. Musculoskeletal: Negative for back pain and neck pain.      Ankle pain Skin: Negative for itching. Neurological: Negative for weakness. All other systems reviewed and are negative. Physical ExamED Triage VitalsBP (!) 141/76  - Pulse 81  - Temp 98.3 ?F (36.8 ?C) (Oral)  - Resp 19  - Wt 70.3 kg  - SpO2 97%  - BMI 31.31 kg/m? Physical ExamVitals and nursing note reviewed. Constitutional:     General: She is not in acute distress.   Appearance: She is well-developed. She is not ill-appearing, toxic-appearing or diaphoretic. HENT:    Head: Normocephalic and atraumatic.    Nose: Nose normal. Eyes:    General:       Right eye: No discharge.       Left eye: No discharge. Cardiovascular:    Rate and Rhythm: Normal rate and regular rhythm.    Heart sounds: Normal heart sounds. Pulmonary:  Effort: Pulmonary effort is normal. No respiratory distress.    Breath sounds: Normal breath sounds. No wheezing or rales. Chest:    Chest wall: No tenderness. Musculoskeletal:       General: Tenderness present. Normal range of motion. Cervical back: Normal range of motion and neck supple.    Left ankle: No swelling, deformity, ecchymosis or lacerations. Tenderness present over the lateral malleolus. No medial malleolus tenderness. Normal range of motion. Normal pulse.      Legs:Skin:   General: Skin is warm and dry.    Capillary Refill: Capillary refill takes less than 2 seconds. Neurological:    General: No focal deficit present.    Mental Status: She is alert and oriented to person, place, and time.    Coordination: Coordination normal. Psychiatric:       Mood and Affect: Mood normal.       Behavior: Behavior normal.  ProceduresProceduresResident/APP ZOX:WRUEAVW was walking twisted her ankle yesterday.  Patient with pain and tenderness over the lateral malleolus.  No pain or tenderness over the Achilles tendon.  Ankle stable.  2+ dorsalis pedis pulses intact.  Skin intact.  No neurologic vascular compromise noted.Will obtain x-ray of reassessedAnkle 3V Left Final Result  Posterior ankle joint effusion with soft tissue swelling predominantly laterally. No displaced fracture or dislocation.    Reported And Signed By: Zacarias Pontes, MD      [facility] Biomed[facility]   Follow-up with PCP regarding asymptomatic hypertension.Air splint and crutches given.  Follow-up with PCP or Podiatry 2 to 3 days.  If worse.ED COURSEPatient Reevaluation: ED Attestation: PA/APRNFace to face evaluation was performed by me in collaboration with the Advanced Practice Provider to assess for significant health threats. I provided a substantive portion of the care of this patient.? I personally performed the MDM On my exam:awake, alert, nad, mild edema to left ankle, rom intact, no erythemaMy differential includes:ankle sprain, less likely fx, doubt infection------------------------------------Katherine Couturier, MD/MPHYale Emergency MedicineClinical Impressions as of 11/11/20 2132 Sprain of left ankle, unspecified ligament, initial encounter Effusion of ankle joint, unspecified laterality  ED DispositionDischarge Couturier, Dillard Essex, MD07/03/22 1257 Elwanda Brooklyn, PA07/18/22 2132

## 2020-11-12 NOTE — ED Notes
2:05 AM Report received, care assumed.

## 2020-11-13 ENCOUNTER — Inpatient Hospital Stay: Admit: 2020-11-13 | Payer: PRIVATE HEALTH INSURANCE

## 2020-11-13 ENCOUNTER — Encounter: Admit: 2020-11-13 | Payer: PRIVATE HEALTH INSURANCE | Attending: Surgery of the Hand | Primary: Internal Medicine

## 2020-11-13 ENCOUNTER — Telehealth: Admit: 2020-11-13 | Payer: PRIVATE HEALTH INSURANCE | Attending: Medical Oncology | Primary: Internal Medicine

## 2020-11-13 DIAGNOSIS — K56609 Unspecified intestinal obstruction, unspecified as to partial versus complete obstruction: Secondary | ICD-10-CM

## 2020-11-13 LAB — TROPONIN T HIGH SENSITIVITY, 1 HOUR WITH REFLEX (BH GH LMW YH)
BKR TROPONIN T HS 1 HOUR DELTA FROM 0 HOUR: 27 ng/L
BKR TROPONIN T HS 1 HOUR: 36 ng/L — ABNORMAL HIGH

## 2020-11-13 LAB — CBC WITH AUTO DIFFERENTIAL
BKR WAM ABSOLUTE IMMATURE GRANULOCYTES.: 0.03 x 1000/ÂµL (ref 0.00–0.30)
BKR WAM ABSOLUTE IMMATURE GRANULOCYTES.: 0.05 x 1000/ÂµL (ref 0.00–0.30)
BKR WAM ABSOLUTE LYMPHOCYTE COUNT.: 0.67 x 1000/ÂµL (ref 0.60–3.70)
BKR WAM ABSOLUTE LYMPHOCYTE COUNT.: 0.72 x 1000/ÂµL (ref 0.60–3.70)
BKR WAM ABSOLUTE NRBC (2 DEC): 0 x 1000/ÂµL (ref 0.00–1.00)
BKR WAM ABSOLUTE NRBC (2 DEC): 0 x 1000/ÂµL (ref 0.00–1.00)
BKR WAM ANALYZER ANC: 8.42 x 1000/ÂµL — ABNORMAL HIGH (ref 2.00–7.60)
BKR WAM ANALYZER ANC: 12.3 x 1000/ÂµL — ABNORMAL HIGH (ref 2.00–7.60)
BKR WAM BASOPHIL ABSOLUTE COUNT.: 0.03 x 1000/ÂµL (ref 0.00–1.00)
BKR WAM BASOPHIL ABSOLUTE COUNT.: 0.05 x 1000/ÂµL (ref 0.00–1.00)
BKR WAM BASOPHILS: 0.3 % (ref 0.0–1.4)
BKR WAM BASOPHILS: 0.3 % (ref 0.0–1.4)
BKR WAM EOSINOPHIL ABSOLUTE COUNT.: 0.03 x 1000/ÂµL (ref 0.00–1.00)
BKR WAM EOSINOPHIL ABSOLUTE COUNT.: 0.01 x 1000/ÂµL (ref 0.00–1.00)
BKR WAM EOSINOPHILS: 0.3 % (ref 0.0–5.0)
BKR WAM EOSINOPHILS: 0.1 % (ref 0.0–5.0)
BKR WAM HEMATOCRIT (2 DEC): 36.3 % (ref 35.00–45.00)
BKR WAM HEMATOCRIT (2 DEC): 45 % (ref 35.00–45.00)
BKR WAM HEMOGLOBIN: 10.9 g/dL — ABNORMAL LOW (ref 11.7–15.5)
BKR WAM HEMOGLOBIN: 13.5 g/dL (ref 11.7–15.5)
BKR WAM IMMATURE GRANULOCYTES: 0.3 % (ref 0.0–1.0)
BKR WAM IMMATURE GRANULOCYTES: 0.3 % (ref 0.0–1.0)
BKR WAM LYMPHOCYTES: 6.7 % — ABNORMAL LOW (ref 17.0–50.0)
BKR WAM LYMPHOCYTES: 5 % — ABNORMAL LOW (ref 17.0–50.0)
BKR WAM MCH (PG): 26.6 pg — ABNORMAL LOW (ref 27.0–33.0)
BKR WAM MCH (PG): 25.9 pg — ABNORMAL LOW (ref 27.0–33.0)
BKR WAM MCHC: 30 g/dL — ABNORMAL LOW (ref 31.0–36.0)
BKR WAM MCHC: 30 g/dL — ABNORMAL LOW (ref 31.0–36.0)
BKR WAM MCV: 88.5 fL (ref 80.0–100.0)
BKR WAM MCV: 86.4 fL (ref 80.0–100.0)
BKR WAM MONOCYTE ABSOLUTE COUNT.: 0.86 x 1000/ÂµL (ref 0.00–1.00)
BKR WAM MONOCYTE ABSOLUTE COUNT.: 1.26 x 1000/ÂµL — ABNORMAL HIGH (ref 0.00–1.00)
BKR WAM MONOCYTES: 8.6 % (ref 4.0–12.0)
BKR WAM MONOCYTES: 8.8 % (ref 4.0–12.0)
BKR WAM MPV: 11.8 fL (ref 8.0–12.0)
BKR WAM MPV: 11.3 fL (ref 8.0–12.0)
BKR WAM NEUTROPHILS: 83.8 % — ABNORMAL HIGH (ref 39.0–72.0)
BKR WAM NEUTROPHILS: 85.5 % — ABNORMAL HIGH (ref 39.0–72.0)
BKR WAM NUCLEATED RED BLOOD CELLS: 0 % (ref 0.0–1.0)
BKR WAM NUCLEATED RED BLOOD CELLS: 0 % (ref 0.0–1.0)
BKR WAM PLATELETS: 298 x1000/ÂµL (ref 150–420)
BKR WAM PLATELETS: 342 x1000/ÂµL (ref 150–420)
BKR WAM RDW-CV: 16.1 % — ABNORMAL HIGH (ref 11.0–15.0)
BKR WAM RDW-CV: 16.5 % — ABNORMAL HIGH (ref 11.0–15.0)
BKR WAM RED BLOOD CELL COUNT.: 4.1 M/ÂµL (ref 4.00–6.00)
BKR WAM RED BLOOD CELL COUNT.: 5.21 M/ÂµL (ref 4.00–6.00)
BKR WAM WHITE BLOOD CELL COUNT: 10 x1000/ÂµL (ref 4.0–11.0)
BKR WAM WHITE BLOOD CELL COUNT: 14.4 x1000/ÂµL — ABNORMAL HIGH (ref 4.0–11.0)

## 2020-11-13 LAB — BASIC METABOLIC PANEL
BKR ANION GAP: 17 (ref 7–17)
BKR BLOOD UREA NITROGEN: 13 mg/dL (ref 8–23)
BKR BUN / CREAT RATIO: 27.7 — ABNORMAL HIGH (ref 8.0–23.0)
BKR CALCIUM: 6.6 mg/dL — ABNORMAL LOW (ref 8.8–10.2)
BKR CHLORIDE: 110 mmol/L — ABNORMAL HIGH (ref 98–107)
BKR CO2: 14 mmol/L — ABNORMAL LOW (ref 20–30)
BKR CREATININE: 0.47 mg/dL (ref 0.40–1.30)
BKR EGFR (AFR AMER): >60 mL/min/{1.73_m2} (ref >60)
BKR EGFR (NON AFRICAN AMERICAN): >60 mL/min/{1.73_m2} (ref >60)
BKR GLUCOSE: 93 mg/dL (ref 70–100)
BKR POTASSIUM: 3.2 mmol/L — ABNORMAL LOW (ref 3.3–5.3)
BKR SODIUM: 141 mmol/L (ref 136–144)

## 2020-11-13 LAB — PROTIME AND INR
BKR INR: 3.58 — ABNORMAL HIGH (ref 0.87–1.14)
BKR PROTHROMBIN TIME: 34.4 s — ABNORMAL HIGH (ref 9.6–12.3)

## 2020-11-13 LAB — TROPONIN T HIGH SENSITIVITY, 0 HOUR BASELINE WITH REFLEX (BH GH LMW YH): BKR TROPONIN T HS 0 HOUR BASELINE: 9 ng/L

## 2020-11-13 LAB — MAGNESIUM: BKR MAGNESIUM: 1.7 mg/dL (ref 1.7–2.4)

## 2020-11-13 LAB — LACTIC ACID, PLASMA: BKR LACTATE: 0.9 mmol/L (ref 0.5–2.2)

## 2020-11-13 LAB — PHOSPHORUS     (BH GH L LMW YH): BKR PHOSPHORUS: 2.3 mg/dL (ref 2.2–4.5)

## 2020-11-13 MED ORDER — MAGNESIUM SULFATE 2 GRAM/50 ML (4 %) IN WATER INTRAVENOUS PIGGYBACK
2 gram/50 mL (4 %) | Freq: Once | INTRAVENOUS | Status: CP
Start: 2020-11-13 — End: ?
  Administered 2020-11-13: 13:00:00 2 mL/h via INTRAVENOUS

## 2020-11-13 MED ORDER — IOHEXOL 350 MG IODINE/ML ORAL
350 mg/mL | Freq: Once | NASOGASTRIC | Status: CP
Start: 2020-11-13 — End: ?
  Administered 2020-11-13: 14:00:00 350 mL via NASOGASTRIC

## 2020-11-13 MED ORDER — SODIUM CHLORIDE 0.9 % IV BOLUS FROM BAG
0.9 % | INTRAVENOUS | Status: CP
Start: 2020-11-13 — End: ?
  Administered 2020-11-13: 09:00:00 0.9 mL/h via INTRAVENOUS

## 2020-11-13 MED ORDER — D5 1/2 NS WITH POTASSIUM CHLORIDE INFUSION (NON-FLOORSTOCK)
INTRAVENOUS | Status: DC
Start: 2020-11-13 — End: 2020-11-13

## 2020-11-13 MED ORDER — INSULIN U-100 REGULAR HUMAN 100 UNIT/ML (SLIDING SCALE)
100 unit/mL | Freq: Four times a day (QID) | SUBCUTANEOUS | Status: DC
Start: 2020-11-13 — End: 2020-11-16
  Administered 2020-11-13 – 2020-11-16 (×5): 100 unit/mL via SUBCUTANEOUS

## 2020-11-13 MED ORDER — POTASSIUM CHLORIDE 20 MEQ/L IN DEXTROSE 5 %-0.45 % SODIUM CHLORIDE IV
20 mEq/L | INTRAVENOUS | Status: DC
Start: 2020-11-13 — End: 2020-11-14
  Administered 2020-11-13 – 2020-11-14 (×3): 20 mL/h via INTRAVENOUS

## 2020-11-13 MED ORDER — FAMOTIDINE 4 MG/ML IN 0.9% SODIUM CHLORIDE (ADULT)
Freq: Two times a day (BID) | INTRAVENOUS | Status: DC
Start: 2020-11-13 — End: 2020-11-15
  Administered 2020-11-14 – 2020-11-15 (×3): 5.000 mL via INTRAVENOUS

## 2020-11-13 MED ORDER — POTASSIUM CHLORIDE 10 MEQ/100ML IN STERILE WATER INTRAVENOUS PIGGYBACK
10 mEq/0 mL | INTRAVENOUS | Status: CP
Start: 2020-11-13 — End: ?
  Administered 2020-11-13 (×4): 10 mL/h via INTRAVENOUS

## 2020-11-13 MED ORDER — POTASSIUM CHLORIDE 20 MEQ/100ML IN STERILE WATER INTRAVENOUS PIGGYBACK
20 mEq/100 mL | INTRAVENOUS | Status: DC
Start: 2020-11-13 — End: 2020-11-13

## 2020-11-13 NOTE — Progress Notes
Pt seen and examined after being notified by RN that she was having increasing and constant abdominal pain, ongoing nausea and dry heaves.  Upon evaluation she was uncomfortable complaining of pain to the Left aspect of her abdomen, stating pain was worse with inspiration and uncontrollable nausea but she has been unable to vomit.  She endorses that she feels she would feel better if she could make herself vomit but she has been only heaving up some thick saliva.  She states her abdomen is more distended and firm compared to earlier in the day and she stopped passing flatus ~4-5hr ago. On exam, abdomen moderately distended and tympanitic, tender on the Left side, without over guarding and fairly soft.  She has remained on room air without increased WOB.  NGT placement was discussed with her and she was amendable hoping it would help alleviate some of her Sx.An NGT was placed to 55cm with ease and without complication w/ ~400cc of thick light brown content removed on placement.  Shortly after NGT was secured in place she began to fall in and out of sleep.  Her abdomen was less firm and minimally less distended but she ultimately endorsed that her pain and nausea had greatly improved.- STAT CXR ordered to confirm placement- Continue mIVFs- May have ice chips for comfort- Keep NGT to LCWS overnight- Monitor I's & O's as best as possible (pt incontinent a lot at baseline so exact outputs may be difficult to attain)- No need for tele right now, continue w/ Q4hr VS- AM labs as ordered- Should pt have worsening pain overnight or worsening abdominal exam, will plan to send labs early, including lactateAlyssa Rolena Infante PA-C Pager: [phone]#: [phone] Surgery Floor Dynamic Role: [phone] (Pager [phone])[facility] Floor Dynamic Role: [phone] (Pager [phone])[facility] Floor Dynamic Role:  [phone] (Pager [phone])

## 2020-11-13 NOTE — Progress Notes
24h Events:- NGT placed around 10pm for continued nausea and discomfort, bilious fluid immediately- chest pain around, EKG with no ischemic changes. Troponin ordered, LA 0.9.S:She is having mild  abdominal pain.She is having some mild nausea in AM, but no vomiting. She is not tolerating a diet and is NPO.She is passing gas and is not having bowel movements.O:Temp:  [36.5 ?C-37 ?C] 36.5 ?CPulse:  [93-118] 118Resp:  [16-18] 18BP: (135-191)/(38-83) 158/66SpO2:  [95 %-100 %] 98 %Abdomen: On inspection is distended, is tympanitic, is appropriately tender, moreso on left side.Chest: RRR, lung CLABI/O last 3 completed shifts:In: 0 Out: 1600 [Urine:800; Other:800]NGT: 800cc dark brown fluidA:Patient is a 70 year old woman with PMH of DM, HTN, depression, lupus anticoagulant disorder (on coumadin at home) and an extensive surgical history and recurrent SBO who is on hospital day 1 for a SBO. She is passing gas but has not had BM. An NGT is placed. P:- NGT care and maintenance- Continue mIVF- Continue serial abdominal exams- Continue hold dvt proph until INR <1.5  Then start lvnx (3.58 on 7/20)- Pain control with dilaudid - F/U troponin - f/u afternoon cbc-Diet: NPO-DVT Prophylaxis: Sequential Compression Leotis Shames, MD PGY1Anesthesiology Intern7/20/2022Contact:Colorectal Floor Pager: [phone] saw and examined the patient with Dr. Nedra Hai and I agree with the history, physical assessment and plan. Briefly, still having LLQ pain, some flatus yest, no BM. NG placed for nausea, drained 800 b ilious.avssabd softer, less disten, slt tender LLQ w/o guardingPSBOTry contrast evalRepeat labs Hold coumadin

## 2020-11-13 NOTE — Plan of Care
Plan of Care Overview/ Patient Status    PT A/O x4. RA. BP in the 160s, PA. Tuozzoli aware. Pain managed w/ Dilaudid w/ good effect, PT is nauseous, dry-heaving, Abd is distended. PA. Tuozzoli at bediside, NGT placed, Light brown output. This morning pt complainng of pressure on her chest EKG, team aware, PA came to bedside, NGT adjusted. EKG done and labs sent. OOB w/ walker and SBA. Voids spontaneously.

## 2020-11-13 NOTE — Plan of Care
Plan of Care Overview/ Patient Status    Problem: Adult Inpatient Plan of CareGoal: Readiness for Transition of CareOutcome: Interventions implemented as appropriate Case Management Screening  Flowsheet Row Most Recent Value Case Management Screening: Chart review completed. If YES to any question below then proceed to CM Eval/Plan  Is there a change in their cognitive function No Do you anticipate a change in this patient's physicial function that will effect discharge needs? No Has there been a readmission within the last 30 days No Were there services prior to admission ( Examples: Assisted Living, HD, Homecare, Extended Care Facility, Methadone, SNF, Outpatient Infusion Center) No Negative/Positive Screen Negative Screening: Case Management department will follow patient's progress and discuss plan of care with treatment team. Case Manager Attestation  I have reviewed the medical record and completed the above screen. CM staff will follow patient's progress and discuss the plan of care with the Treatment Team. Yes  is a 70 year old woman with PMH of DM, HTN, depression, lupus anticoagulant disorder (on coumadin at home) and an extensive surgical history and recurrent SBO who is on hospital day 1 for a SBO. She is passing gas but has not had BM. An NGT is placed. Assessment screening completed. Continue to follow patient's progress and discuss plan of care with treatment team.  Discharge needs not determined at this time. Care Management will continue to follow with team. Lovell Sheehan, RN,MSN [facility] Manager, Care ManagementMHB:[phone]

## 2020-11-13 NOTE — Plan of Care
Plan of Care Overview/ Patient Status    0700-1930BP and HR continue to be elevated at times, team aware, will monitor. NPO, intermittent nausea, prn Zofran given with some relief, will monitor. FS as documented, insulin per MAR. NGT to LCWS, moderate brown output. Contrast given per NGT, clamped as ordered, XRay in evening. NGT reconnected to suction per MD after XRay. IVF continues as ordered. Mg and K repletion as ordered. Repeat CBC sent as ordered. Spont voids, + flatus late in shift, no stool. OOB with x1 assist and walker ambulating. Call bell within reach at all times. See flow sheets for more details.

## 2020-11-13 NOTE — Telephone Encounter
Patient called, she is in the hospital. She asked if Nadya could CX her [facility] home draw today, patient does not have the number for [facility]

## 2020-11-14 ENCOUNTER — Inpatient Hospital Stay: Admit: 2020-11-14 | Payer: PRIVATE HEALTH INSURANCE

## 2020-11-14 DIAGNOSIS — K56609 Unspecified intestinal obstruction, unspecified as to partial versus complete obstruction: Secondary | ICD-10-CM

## 2020-11-14 LAB — BASIC METABOLIC PANEL
BKR ANION GAP: 16 (ref 7–17)
BKR BLOOD UREA NITROGEN: 16 mg/dL (ref 8–23)
BKR BUN / CREAT RATIO: 23.2 — ABNORMAL HIGH (ref 8.0–23.0)
BKR CALCIUM: 9.1 mg/dL (ref 8.8–10.2)
BKR CHLORIDE: 99 mmol/L (ref 98–107)
BKR CO2: 18 mmol/L — ABNORMAL LOW (ref 20–30)
BKR CREATININE: 0.69 mg/dL (ref 0.40–1.30)
BKR EGFR (AFR AMER): >60 mL/min/{1.73_m2} (ref >60)
BKR EGFR (NON AFRICAN AMERICAN): >60 mL/min/{1.73_m2} (ref >60)
BKR GLUCOSE: 180 mg/dL — ABNORMAL HIGH (ref 70–100)
BKR POTASSIUM: 4.3 mmol/L (ref 3.3–5.3)
BKR SODIUM: 133 mmol/L — ABNORMAL LOW (ref 136–144)

## 2020-11-14 LAB — CBC WITH AUTO DIFFERENTIAL
BKR WAM ABSOLUTE IMMATURE GRANULOCYTES.: 0.03 x 1000/ÂµL (ref 0.00–0.30)
BKR WAM ABSOLUTE LYMPHOCYTE COUNT.: 0.93 x 1000/ÂµL (ref 0.60–3.70)
BKR WAM ABSOLUTE NRBC (2 DEC): 0 x 1000/ÂµL (ref 0.00–1.00)
BKR WAM ANALYZER ANC: 7.92 x 1000/ÂµL — ABNORMAL HIGH (ref 2.00–7.60)
BKR WAM BASOPHIL ABSOLUTE COUNT.: 0.02 x 1000/ÂµL (ref 0.00–1.00)
BKR WAM BASOPHILS: 0.2 % (ref 0.0–1.4)
BKR WAM EOSINOPHIL ABSOLUTE COUNT.: 0.09 x 1000/ÂµL (ref 0.00–1.00)
BKR WAM EOSINOPHILS: 0.9 % (ref 0.0–5.0)
BKR WAM HEMATOCRIT (2 DEC): 42.2 % (ref 35.00–45.00)
BKR WAM HEMOGLOBIN: 13 g/dL (ref 11.7–15.5)
BKR WAM IMMATURE GRANULOCYTES: 0.3 % (ref 0.0–1.0)
BKR WAM LYMPHOCYTES: 9.2 % — ABNORMAL LOW (ref 17.0–50.0)
BKR WAM MCH (PG): 26.3 pg — ABNORMAL LOW (ref 27.0–33.0)
BKR WAM MCHC: 30.8 g/dL — ABNORMAL LOW (ref 31.0–36.0)
BKR WAM MCV: 85.3 fL (ref 80.0–100.0)
BKR WAM MONOCYTE ABSOLUTE COUNT.: 1.09 x 1000/ÂµL — ABNORMAL HIGH (ref 0.00–1.00)
BKR WAM MONOCYTES: 10.8 % (ref 4.0–12.0)
BKR WAM MPV: 11.9 fL (ref 8.0–12.0)
BKR WAM NEUTROPHILS: 78.6 % — ABNORMAL HIGH (ref 39.0–72.0)
BKR WAM NUCLEATED RED BLOOD CELLS: 0 % (ref 0.0–1.0)
BKR WAM PLATELETS: 342 x1000/ÂµL (ref 150–420)
BKR WAM RDW-CV: 16.4 % — ABNORMAL HIGH (ref 11.0–15.0)
BKR WAM RED BLOOD CELL COUNT.: 4.95 M/ÂµL (ref 4.00–6.00)
BKR WAM WHITE BLOOD CELL COUNT: 10.1 x1000/ÂµL (ref 4.0–11.0)

## 2020-11-14 LAB — PROTIME AND INR
BKR INR: 5.94 — CR (ref 0.87–1.14)
BKR PROTHROMBIN TIME: 54.3 s — ABNORMAL HIGH (ref 9.6–12.3)

## 2020-11-14 LAB — MAGNESIUM: BKR MAGNESIUM: 2.1 mg/dL (ref 1.7–2.4)

## 2020-11-14 LAB — PHOSPHORUS     (BH GH L LMW YH): BKR PHOSPHORUS: 1.3 mg/dL — ABNORMAL LOW (ref 2.2–4.5)

## 2020-11-14 MED ORDER — POTASSIUM CHLORIDE 20 MEQ/L IN D5-0.9 % SODIUM CHLORIDE INTRAVENOUS
20 mEq/L | INTRAVENOUS | Status: DC
Start: 2020-11-14 — End: 2020-11-16
  Administered 2020-11-14 – 2020-11-15 (×3): 20 mL/h via INTRAVENOUS

## 2020-11-14 NOTE — Plan of Care
Plan of Care Overview/ Patient Status    Pt is alert and oriented.  VS stable on RA.  NGT total output this shift . Dark green output noted.  Pain management continues.  Pt is up to toilet x1 assist. Voiding spontaneously, no c/o dysuria. Safety maintained.  No BM today.  Kept NPO, IVF continues.  Bleeding precautions maintained for increased level of INR.  Pt is aware of plan of care.  Supportive care continues.

## 2020-11-14 NOTE — Progress Notes
24h Events:- Chest pain resolved, no further cardiac workup- Contrast abdominal follow throughS:She is having mild  abdominal pain.She is not having nausea or vomiting. She is not tolerating a diet and is NPO.She is passing gas and is not having bowel movements.O:Temp:  [36.7 ?C-37.2 ?C] 36.8 ?CPulse:  [97-119] 112Resp:  [18-20] 18BP: (111-160)/(50-76) 138/76SpO2:  [95 %-98 %] 98 %Abdomen: On inspection is distended, is tympanitic, is appropriately tender, mildy more tender on LLQChest: RRR, lung CLABI/O last 3 completed shifts:In: 3693.1 [I.V.:2811.2; IV Piggyback:881.9]Out: 3275 [Urine:1850; Other:1425]NGT: 1425 cc brown/green fluidA:Patient is a 70 year old woman with PMH of DM, HTN, depression, lupus anticoagulant disorder (on coumadin at home) and an extensive surgical history and recurrent SBO who is on hospital day 2 for a SBO. She is passing gas but has not had BM. Subjectively feels more comfortable. An NGT is placed. Small bowel contrast follow through shows contrast in cecum.P:- NGT care and maintenance- Continue mIVF- Continue serial abdominal exams- Continue hold dvt proph until INR <1.5  Then start lvnx (3.58 on 7/20)- Pain control with dilaudid - f/u labs-Diet: NPO-DVT Prophylaxis: Sequential Compression Leotis Shames, MD PGY1Anesthesiology Intern7/21/2022Contact:Colorectal Floor Pager: [phone] saw and examined the patient with Dr. Nicole Kindred and I agree with the history, physical assessment and plan. Briefly, she feels better with less abd pain and now passing gas, no BM.  avssabd softer, less disten, min tender LLQaxr shows contrast in colonSBO resolvingCont NG and one more follow up [id number] anticoag; check with coumadin clinic about low dose vit K

## 2020-11-14 NOTE — Plan of Care
Plan of Care Overview/ Patient Status    PT A/O x4. RA. Tachy in the 90s, VSS. Pain managed w/ dilaudid w/ good effect. Remains NPO, No nausea this shift. NGT to LCWS, dark brown output. Voids spontaneously. OOB w/ walker and SBA. Skin is intact

## 2020-11-14 NOTE — ED Provider Notes
HistoryChief Complaint Patient presents with ? Abdominal Pain   Pt presents w/ RUQ and LUQ abd pain starting tonight at 8pm associated w/ nausea. Pt w/ a hx of SBO x5. Pt A+Ox4, VSS on ra  The history is provided by medical records, the spouse and the patient. Abdominal PainPain location:  LUQ and RLQPain quality: aching  Pain radiates to:  Does not radiatePain severity:  ModerateOnset quality:  SuddenDuration:  3 hoursTiming:  ConstantProgression:  WorseningChronicity:  RecurrentContext: previous surgery  Relieved by:  NothingWorsened by:  NothingAssociated symptoms: chills and nausea  Associated symptoms: no constipation, no cough, no diarrhea, no dysuria, no fever, no shortness of breath and no vomiting  Risk factors: being elderly, multiple surgeries and obesity   Past Medical History: Diagnosis Date ? Acute ischemic colitis (HC Code) (HC CODE) (HC Code) 2015 ? Depression  ? Diabetes mellitus (HC Code) (HC CODE) (HC Code)  ? Hypercholesteremia  ? Lupus anticoagulant disorder (HC Code) (HC CODE) (HC Code)  ? Rosacea  ? Ventral hernia with bowel obstruction  ? Vertigo  Past Surgical History: Procedure Laterality Date ? ABDOMINAL ADHESION SURGERY  2016 ? BLEPHAROPLASTY Bilateral 05/2016 ? COLOSTOMY  05/27/13 ? COLOSTOMY CLOSURE   ? HYSTEROSCOPY W/ POLYPECTOMY  06/18/2020 ? KNEE LIGAMENT RECONSTRUCTION Right 2004 ? LAPAROSCOPIC NISSEN FUNDOPLICATION    2000 ? ? SINUS SURGERY   ? THYROID BIOPSY   ? TUBAL LIGATION   ? UMBILICAL HERNIA REPAIR   ? VENTRAL HERNIA REPAIR   Family History Problem Relation Age of Onset ? Bladder cancer Mother  ? Colon cancer Paternal Aunt  ? Colon cancer Cousin       died of colona cancer at age 22 ? Breast cancer Cousin       2 cousins with breast cancer ? Colon cancer Father  Social History Socioeconomic History ? Marital status: Married Tobacco Use ? Smoking status: Never Smoker ? Smokeless tobacco: Never Used Vaping Use ? Vaping Use: Never used Substance and Sexual Activity ? Alcohol use: Not Currently ? Drug use: No ? Sexual activity: Yes   Partners: Male ED Other Social History ? E-cigarette status Never User  ? E-Cigarette Use Never User  E-cigarette/Vaping Substances E-cigarette/Vaping Devices Review of Systems Constitutional: Positive for chills. Negative for fever. Respiratory: Negative for cough and shortness of breath.  Gastrointestinal: Positive for abdominal pain and nausea. Negative for constipation, diarrhea and vomiting. Genitourinary: Negative for dysuria. All other systems reviewed and are negative. Physical ExamED Triage Vitals [11/11/20 2322]BP: (!) 169/77Pulse: 83Pulse from  O2 sat: n/aResp: 18Temp: (!) 96.7 ?F (35.9 ?C)Temp src: TemporalSpO2: 100 % BP (!) 117/54  - Pulse (!) 97  - Temp 98.4 ?F (36.9 ?C) (Oral)  - Resp 18  - Ht 4' 11 (1.499 m)  - Wt 70.9 kg  - SpO2 96%  - BMI 31.57 kg/m? Physical ExamVitals and nursing note reviewed. Constitutional:     Appearance: Normal appearance. She is well-developed. She is obese. HENT:    Head: Normocephalic and atraumatic.    Nose: Nose normal.    Mouth/Throat:    Mouth: Mucous membranes are moist. Eyes:    Pupils: Pupils are equal, round, and reactive to light. Cardiovascular:    Rate and Rhythm: Normal rate and regular rhythm.    Heart sounds: Normal heart sounds. Pulmonary:    Effort: Pulmonary effort is normal. No respiratory distress.    Breath sounds: Normal breath sounds. No stridor. No wheezing, rhonchi or rales. Abdominal:    General: There is  no distension.    Palpations: Abdomen is soft.    Tenderness: There is no guarding or rebound.    Comments: Well healed midline incision, diffuse ttp, greatest in LUQ and RLQ Musculoskeletal:    Cervical back: Normal range of motion and neck supple. Skin:   General: Skin is warm and dry.    Capillary Refill: Capillary refill takes less than 2 seconds. Neurological:    Mental Status: She is alert and oriented to person, place, and time. Mental status is at baseline.  ProceduresProceduresResident/APP MDM:PGY-3 Emergency Medicine Resident MDM: ----------------------------------------------------------------------------Presentation:  Pt is a 70 y.o. female, with a hx of for DVT on warfarin (borderline abnormal lupus anticoagulant), DM and numerous abdominal surgeries (s/p Nissen fundoplication, s/p umbilical hernia repair, s/p extended left hemicolectomy for mesenteric ischemia with subsequent reversal of colostomy and lysis of adheasions, and s/p multiple ventral hernia repairs with mesh), presents to the ED with c/o abdominal pain - primarily LUQ and RLQ that began at 8pm. Reports nausea, denies vomiting. Had small bowel movements, 5pm and 9pm. Denies urinary sx. Feels like prior sbos.- remaining review of systems and physical exam as detailed belowVital Signs: Vital signs at exam: BP (!) 169/77  - Pulse 83  - Temp (!) 96.7 ?F (35.9 ?C) (Temporal)  - Resp 18  - SpO2 100% DDx: sbo/ileus, enteritis, colitis, mesenteric ischemia, appendicitis, abscess, hernia, pancreatitisMDM/COURSE:- CBC, CMP, Lipase, UA, [place] Abd w IV and Po contrastThis patient was presented to and discussed with Dr. Harlin Heys and a treatment plan and disposition were collaboratively agreed upon.Aldean Jewett, M.D.Emergency MedicinePGY-3Can be contacted via MHB.ED COURSEPatient Reevaluation: Attending Supervised: ResidentI saw and examined the patient. I agree with the findings and plan of care as documented in the resident's note. Of note45 year old female with a past medical history of diabetes mellitus, previous diverticulitis, previous ischemic colitis, multiple bowel resections and multiple previous small bowel obstructions presented emergency department complaining of sudden onset of generalized abdominal pain and nausea starting this evening.  Patient reports that here today she had 2 bowel movements, denies any vomiting at this time.On physical exam patient is in no acute distress with reassuring vital signs.  Breath sounds are clear and equal bilaterally patient has regular heart rate and rhythm.  Abdomen is soft, generalized tenderness, no rebound or guarding.Differential includes SBO, ischemic colitis, diverticulitis, gastroenteritis-labs-[place] abdomen pelvis-symptomatic treatment-re-evaluate Carlosdaniel Grob K RollinsSign-out received at 7:00 AM from Dr. Sallye Lat, patient is a 56-yo female w/pmh of DM, hx of ischemic colitis, multiple bowel resections and SBOs now found to have a high-grade obstruction. Upon reassessment, patient found to be minimal distress, pain well-controlled, not nauseous. Admitted to Surgery Floor. Clinical Impressions as of 11/14/20 0522 SBO (small bowel obstruction) (HC Code) (HC CODE) [facility] Code)  ED DispositionAdmit Bunnie Domino, MD07/19/22 1548 Trey Sailors, DO07/21/22 0522

## 2020-11-15 LAB — MAGNESIUM: BKR MAGNESIUM: 2 mg/dL (ref 1.7–2.4)

## 2020-11-15 LAB — CBC WITH AUTO DIFFERENTIAL
BKR WAM ABSOLUTE IMMATURE GRANULOCYTES.: 0.04 x 1000/ÂµL (ref 0.00–0.30)
BKR WAM ABSOLUTE LYMPHOCYTE COUNT.: 1.51 x 1000/ÂµL (ref 0.60–3.70)
BKR WAM ABSOLUTE NRBC (2 DEC): 0 x 1000/ÂµL (ref 0.00–1.00)
BKR WAM ANALYZER ANC: 7.28 x 1000/ÂµL (ref 2.00–7.60)
BKR WAM BASOPHIL ABSOLUTE COUNT.: 0.03 x 1000/ÂµL (ref 0.00–1.00)
BKR WAM BASOPHILS: 0.3 % (ref 0.0–1.4)
BKR WAM EOSINOPHIL ABSOLUTE COUNT.: 0.14 x 1000/ÂµL (ref 0.00–1.00)
BKR WAM EOSINOPHILS: 1.4 % (ref 0.0–5.0)
BKR WAM HEMATOCRIT (2 DEC): 44.5 % (ref 35.00–45.00)
BKR WAM HEMOGLOBIN: 13.9 g/dL (ref 11.7–15.5)
BKR WAM IMMATURE GRANULOCYTES: 0.4 % (ref 0.0–1.0)
BKR WAM LYMPHOCYTES: 15.5 % — ABNORMAL LOW (ref 17.0–50.0)
BKR WAM MCH (PG): 26.6 pg — ABNORMAL LOW (ref 27.0–33.0)
BKR WAM MCHC: 31.2 g/dL (ref 31.0–36.0)
BKR WAM MCV: 85.1 fL (ref 80.0–100.0)
BKR WAM MONOCYTE ABSOLUTE COUNT.: 0.73 x 1000/ÂµL (ref 0.00–1.00)
BKR WAM MONOCYTES: 7.5 % (ref 4.0–12.0)
BKR WAM MPV: 11.5 fL (ref 8.0–12.0)
BKR WAM NEUTROPHILS: 74.9 % — ABNORMAL HIGH (ref 39.0–72.0)
BKR WAM NUCLEATED RED BLOOD CELLS: 0 % (ref 0.0–1.0)
BKR WAM PLATELETS: 264 x1000/ÂµL (ref 150–420)
BKR WAM RDW-CV: 16.6 % — ABNORMAL HIGH (ref 11.0–15.0)
BKR WAM RED BLOOD CELL COUNT.: 5.23 M/ÂµL (ref 4.00–6.00)
BKR WAM WHITE BLOOD CELL COUNT: 9.7 x1000/ÂµL (ref 4.0–11.0)

## 2020-11-15 LAB — BASIC METABOLIC PANEL
BKR BLOOD UREA NITROGEN: 11 mg/dL (ref 8–23)
BKR BUN / CREAT RATIO: 24.4 — ABNORMAL HIGH (ref 8.0–23.0)
BKR CALCIUM: 8.5 mg/dL — ABNORMAL LOW (ref 8.8–10.2)
BKR CHLORIDE: 101 mmol/L (ref 98–107)
BKR CO2: 18 mmol/L — ABNORMAL LOW (ref 20–30)
BKR CREATININE: 0.45 mg/dL (ref 0.40–1.30)
BKR EGFR (AFR AMER): >60 mL/min/{1.73_m2} (ref >60)
BKR EGFR (NON AFRICAN AMERICAN): >60 mL/min/{1.73_m2} (ref >60)
BKR GLUCOSE: 151 mg/dL — ABNORMAL HIGH (ref 70–100)

## 2020-11-15 LAB — PHOSPHORUS     (BH GH L LMW YH): BKR PHOSPHORUS: 1.7 mg/dL — ABNORMAL LOW (ref 2.2–4.5)

## 2020-11-15 LAB — PROTIME AND INR
BKR INR: 3.33 — ABNORMAL HIGH (ref 0.87–1.14)
BKR PROTHROMBIN TIME: 32.2 s — ABNORMAL HIGH (ref 9.6–12.3)

## 2020-11-15 MED ORDER — PANTOPRAZOLE IV PUSH 40 MG VIAL & NS (ADULTS)
Freq: Two times a day (BID) | INTRAVENOUS | Status: DC
Start: 2020-11-15 — End: 2020-11-16
  Administered 2020-11-15 – 2020-11-16 (×3): 10.000 mL via INTRAVENOUS

## 2020-11-15 NOTE — Plan of Care
Maria [name]					Location: EP 46 SURG/[id number] y.o., female				Attending: Tacy Dura, MD	Admit Date: 11/11/2020			[id number] LOS: 3 days INITIAL NUTRITION ASSESSMENTDIET ORDER: Clear LiquidFood Allergies: none per ptANTHROPOMETRICS:Height: 59Admit wt: 70.9 kgDosing Weight: 48.7kg ([place] + 10%) BMI: 31.57 -- based on admit weight Additional wt information:Height and weight confirmed by pt. Per Epic, weight trending up since February. Pt without muscle/fat depletion. No recent weight loss reported. 	ESTIMATED NUTRITION REQUIREMENTS:Kcal/day: 1461	(30 kcal/kg)Protein/day: 58	(1.2 gm/kg)Fluids/day: Fluid management per medical team discretion. (1461 mL/day = 3mL/kg)Needs based on: dosing wt, SBO 	NUTRITION ASSESSMENT:Chart reviewed for prolonged NPO/Clear Liquid diet order. Pt with extensive surgical hx and hx of recurrent SBOs no presenting with SBO. NGT removed on 7/22. Met with pt this afternoon. Pt endorses good appetite, looking forward to diet advancement. Tolerating clears well today. Pt with questions on how to meet with RD outpatient to discuss weight management, DM, and INR management. Provided pt with Nutrition Clinic contact information. Finger sticks 128-182 mg/dL over the past 24 hrs. Hx of DM noted. Insulin regimen on order. Maintained on D5 NS with KCL currently. Unable to meet estimated needs while on clear liquid diet. Would benefit from further diet advancement as medically able. Cultural/Religious/Ethnic Needs: NoNUTRITION DIAGNOSIS:Inadequate energy intake related to current medical status as evidenced by clear liquid diet.INTERVENTIONS/RECOMMENDATIONS: Meals and Snacks:- further diet advancement as able.Goal:  Patient will have diet advanced or initiate nutrition support prior to next nutrition assessmentDischarge Planning and Transfer of Nutrition Care:  Nutrition related discharge needs still being determined at this time, will continue to follow  MONITORING / EVALUATION:Food/Nutrition-Related History:Food and Nutrient IntakeEnteral and Parenteral Nutrition IntakeAnthropometric Measurements:Weight Biochemical Data, Medical Tests and Procedures:Electrolyte and renal profileGlucose/endocrine profileNutrition-Focus Physical FindingsOverall findingsElectronically signed by Jeanella Craze, RD, CNSC July 22, 2022MHB:[phone] note: Nutrition is a consult only service on weekends and holidays.  Please enter a consult in EPIC if assistance is needed on a weekend or holiday or via MHB [phone] to contact the covering RD.

## 2020-11-15 NOTE — Progress Notes
S:She is having mild  abdominal pain.She is not having nausea or vomiting. She  is NPO.She is passing gas and is not having bowel movements.O:Temp:  [36.4 ?C-37.1 ?C] 36.6 ?CPulse:  [70-119] 76Resp:  [16-19] 19BP: (114-171)/(71-91) 171/71SpO2:  [96 %-100 %] 100 %Abdomen: On inspection is distended, is tympanitic, is appropriately tenderI/O last 3 completed shifts:In: - Out: 3450 [Urine:2600; Other:850]NGT: 850 cc light brown/green fluidA:Patient is a 70 year old woman with PMH of DM, HTN, depression, lupus anticoagulant disorder (on coumadin at home) and an extensive surgical history and recurrent SBO who is on hospital day 3 for a SBO. She is passing gas but has not had BM. Subjectively feels more comfortable. An NGT is placed. P:- NGT remove today- Continue mIVF- Continue serial abdominal exams- Start 5mg  coumadin tomorrow - Pain control with dilaudid - f/u labs- Advance diet to clears-DVT Prophylaxis: Sequential Compression Device, Novella Olive, MD PGY1Anesthesiology Intern7/22/2022Contact:Colorectal Floor Pager: [phone]

## 2020-11-15 NOTE — Plan of Care
Plan of Care Overview/ Patient Status    PT A/Ox 4. RA. BP in the 150s. Rest of vitals are stable. Pain managed w/ Dilaudid. Remains NPO. NGT w/ dark brown output. PT reported passing gas. Voids spontaneously, adequate urine output.

## 2020-11-15 NOTE — Plan of Care
Plan of Care Overview/ Patient Status    0700-1930BP elevated at times, pt asymptomatic, MD Eulis Canner notified, no new orders, will monitor. VSS, denies pain, will monitor. NGT removed by team in AM. Advanced to clear liquid diet, denies n/v, FS as documented, insulin per MAR. IVF continues as ordered. Repeat labs sent as ordered. Spont voids, + flatus, +BM. OOB with stand by assist ambulating in hallway. EKG as ordered. Call bell within reach at all times. See flow sheets for more details.

## 2020-11-16 DIAGNOSIS — Z886 Allergy status to analgesic agent status: Secondary | ICD-10-CM

## 2020-11-16 DIAGNOSIS — Z79899 Other long term (current) drug therapy: Secondary | ICD-10-CM

## 2020-11-16 DIAGNOSIS — E669 Obesity, unspecified: Secondary | ICD-10-CM

## 2020-11-16 DIAGNOSIS — Z6831 Body mass index (BMI) 31.0-31.9, adult: Secondary | ICD-10-CM

## 2020-11-16 DIAGNOSIS — Z91040 Latex allergy status: Secondary | ICD-10-CM

## 2020-11-16 DIAGNOSIS — R079 Chest pain, unspecified: Secondary | ICD-10-CM

## 2020-11-16 DIAGNOSIS — Z7901 Long term (current) use of anticoagulants: Secondary | ICD-10-CM

## 2020-11-16 DIAGNOSIS — E785 Hyperlipidemia, unspecified: Secondary | ICD-10-CM

## 2020-11-16 DIAGNOSIS — Z933 Colostomy status: Secondary | ICD-10-CM

## 2020-11-16 DIAGNOSIS — E7800 Pure hypercholesterolemia, unspecified: Secondary | ICD-10-CM

## 2020-11-16 DIAGNOSIS — I10 Essential (primary) hypertension: Secondary | ICD-10-CM

## 2020-11-16 DIAGNOSIS — Z20822 Contact with and (suspected) exposure to covid-19: Secondary | ICD-10-CM

## 2020-11-16 DIAGNOSIS — F32A Depression, unspecified: Secondary | ICD-10-CM

## 2020-11-16 DIAGNOSIS — R42 Dizziness and giddiness: Secondary | ICD-10-CM

## 2020-11-16 DIAGNOSIS — Z885 Allergy status to narcotic agent status: Secondary | ICD-10-CM

## 2020-11-16 DIAGNOSIS — H1013 Acute atopic conjunctivitis, bilateral: Secondary | ICD-10-CM

## 2020-11-16 DIAGNOSIS — E119 Type 2 diabetes mellitus without complications: Secondary | ICD-10-CM

## 2020-11-16 DIAGNOSIS — D6862 Lupus anticoagulant syndrome: Secondary | ICD-10-CM

## 2020-11-16 DIAGNOSIS — K56609 Unspecified intestinal obstruction, unspecified as to partial versus complete obstruction: Secondary | ICD-10-CM

## 2020-11-16 LAB — PHOSPHORUS     (BH GH L LMW YH): BKR PHOSPHORUS: 2.3 mg/dL (ref 2.2–4.5)

## 2020-11-16 LAB — CBC WITH AUTO DIFFERENTIAL
BKR WAM ABSOLUTE IMMATURE GRANULOCYTES.: 0.03 x 1000/ÂµL (ref 0.00–0.30)
BKR WAM ABSOLUTE LYMPHOCYTE COUNT.: 1.33 x 1000/ÂµL (ref 0.60–3.70)
BKR WAM ABSOLUTE NRBC (2 DEC): 0 x 1000/ÂµL (ref 0.00–1.00)
BKR WAM ANALYZER ANC: 7.51 x 1000/ÂµL (ref 2.00–7.60)
BKR WAM BASOPHIL ABSOLUTE COUNT.: 0.04 x 1000/ÂµL (ref 0.00–1.00)
BKR WAM BASOPHILS: 0.4 % (ref 0.0–1.4)
BKR WAM EOSINOPHIL ABSOLUTE COUNT.: 0.19 x 1000/ÂµL (ref 0.00–1.00)
BKR WAM EOSINOPHILS: 2 % (ref 0.0–5.0)
BKR WAM HEMATOCRIT (2 DEC): 43.3 % (ref 35.00–45.00)
BKR WAM HEMOGLOBIN: 13.5 g/dL (ref 11.7–15.5)
BKR WAM IMMATURE GRANULOCYTES: 0.3 % (ref 0.0–1.0)
BKR WAM LYMPHOCYTES: 13.7 % — ABNORMAL LOW (ref 17.0–50.0)
BKR WAM MCH (PG): 26 pg — ABNORMAL LOW (ref 27.0–33.0)
BKR WAM MCHC: 31.2 g/dL (ref 31.0–36.0)
BKR WAM MCV: 83.4 fL (ref 80.0–100.0)
BKR WAM MONOCYTE ABSOLUTE COUNT.: 0.58 x 1000/ÂµL (ref 0.00–1.00)
BKR WAM MONOCYTES: 6 % (ref 4.0–12.0)
BKR WAM MPV: 11.5 fL (ref 8.0–12.0)
BKR WAM NEUTROPHILS: 77.6 % — ABNORMAL HIGH (ref 39.0–72.0)
BKR WAM NUCLEATED RED BLOOD CELLS: 0 % (ref 0.0–1.0)
BKR WAM PLATELETS: 337 x1000/ÂµL (ref 150–420)
BKR WAM RDW-CV: 15.9 % — ABNORMAL HIGH (ref 11.0–15.0)
BKR WAM RED BLOOD CELL COUNT.: 5.19 M/ÂµL (ref 4.00–6.00)
BKR WAM WHITE BLOOD CELL COUNT: 9.7 x1000/ÂµL (ref 4.0–11.0)

## 2020-11-16 LAB — PROTIME AND INR
BKR INR: 2.15 — ABNORMAL HIGH (ref 0.87–1.14)
BKR PROTHROMBIN TIME: 21.7 s — ABNORMAL HIGH (ref 9.6–12.3)

## 2020-11-16 LAB — BASIC METABOLIC PANEL
BKR ANION GAP: 14 (ref 7–17)
BKR BLOOD UREA NITROGEN: 8 mg/dL (ref 8–23)
BKR BUN / CREAT RATIO: 14 (ref 8.0–23.0)
BKR CALCIUM: 8.9 mg/dL (ref 8.8–10.2)
BKR CHLORIDE: 100 mmol/L (ref 98–107)
BKR CO2: 24 mmol/L (ref 20–30)
BKR CREATININE: 0.57 mg/dL (ref 0.40–1.30)
BKR EGFR (AFR AMER): >60 mL/min/{1.73_m2} (ref >60)
BKR EGFR (NON AFRICAN AMERICAN): >60 mL/min/{1.73_m2} (ref >60)
BKR GLUCOSE: 156 mg/dL — ABNORMAL HIGH (ref 70–100)
BKR POTASSIUM: 4 mmol/L (ref 3.3–5.3)
BKR SODIUM: 138 mmol/L (ref 136–144)

## 2020-11-16 LAB — MAGNESIUM: BKR MAGNESIUM: 1.7 mg/dL (ref 1.7–2.4)

## 2020-11-16 MED ORDER — WARFARIN 5 MG TABLET
5 mg | Freq: Every day | ORAL | Status: DC
Start: 2020-11-16 — End: 2020-11-16

## 2020-11-16 NOTE — Discharge Summary
[facility]Med/[facility]urg Discharge SummaryPatient Data:  Patient Name: Maria [name] Admit date: 11/11/2020 Age: 70 y.o. Discharge date: 11/16/20 DOB: Jul 16, 1950	 Discharge Attending Physician: Tacy Dura, MD  MRN: [id number]	 Discharged Condition: good PCP: Cena Benton, MD Disposition: Home  Principal Diagnosis: SBOSecondary Diagnoses:  noneIssues to be Addressed Post Discharge: Issues to be Addressed Post Discharge:1.	Follow up with Dr. Ronnell Freshwater.	Follow up coumadin clinicRelevant Medications on Discharge:New Medications: detailed belowPending Labs and Tests: noneFollow-up Information:Zuckerman, Purnell Shoemaker, MD[address] [facility] [place] [phone] up Future Appointments Date Time Provider Department Center 11/19/2020  8:30 AM Gazes, Casimiro Needle, DPM PDY None 03/26/2021  1:15 PM [facility] MA NP 1268 MAM2 [facility] MAMMO [facility] Rad Hospital Course: Hospital Course: Maria [name] is a 70 y.o. female with a history of DM, HLD, depression, lupus anticoagulant disorder on coumadin (5mg  daily, goal INR 2-3, followed by Coumadin clinic), extensive surgical history c/b multiple SBOs who presented to ED on 7/19 with abdominal pain/nausea x1 day and found to have another SBO on [place] scan. +Flatus, no emesis. INR 2.22 on admission.She was admitted to floor under Dr. Deniece Portela. She was made NPO with MIVF. Due to persistent nausea NGT was placed and set to LCWS overnight.On 7/20, she was kept NPO with NGT to LCWS. On 7/21 NGT put out 1.2 L overnight, AXR showed contrast in the cecum. Patient with positive flatus and improved abdominal distention. On 7/22 her NGT was removed and she was started on a CLD with good toleration. She was advanced to a low residue diet and has been tolerating it well.On day of discharge 7/23, Maria [name] was stable for discharge home. Prescription medications and follow-up appointments were provided to the patient and discussed during discharge teaching and are listed below. Inpatient Consultants and summary of recommendations:nonePertinent lab findings and test results: Objective: Recent Labs Lab 07/21/220623 07/22/220638 07/23/220704 WBC 10.1 9.7 9.7 HGB 13.0 13.9 13.5 HCT 42.20 44.50 43.30 PLT 342 264 337  Recent Labs Lab 07/21/220623 07/22/220638 07/23/220704 NEUTROPHILS 78.6* 74.9* 77.6*  Recent Labs Lab 07/20/220433 07/20/220506 07/21/220623 07/21/220625 07/23/220704 07/23/220708 07/23/221155 NA 141  --  133*  --  138  --   --  K 3.2*  --  4.3  --  4.0  --   --  CL 110*  --  99   < > 100  --   --  CO2 14*  --  18*   < > 24  --   --  BUN 13  --  16   < > 8  --   --  CREATININE 0.47  --  0.69   < > 0.57  --   --  GLU 93   < > 180*   < > 156*   < > 212* ANIONGAP 17  --  16  --  14  --   --   < > = values in this interval not displayed.  Recent Labs Lab 07/21/220623 07/22/220638 07/22/221351 07/23/220704 CALCIUM 9.1 8.5*  --  8.9 MG 2.1 2.0  --  1.7 PHOS 1.3*  --    < > 2.3  < > = values in this interval not displayed.  Recent Labs Lab 07/18/222347 ALT 18 AST 27 ALKPHOS 123* BILITOT 0.2 BILIDIR <0.2  Recent Labs Lab 07/21/220623 07/22/220638 07/23/220704 LABPROT 54.3* 32.2* 21.7* INR 5.94* 3.33* 2.15*  Culture Information:No results for input(s): LABBLOO, LABURIN, LOWERRESPIRA in the last 168 hours.Imaging: Imaging results last 1 week:  XR Chest PA or APResult Date: 11/12/2020 Enteric tube terminates at the first  portion of the duodenum. No acute cardiopulmonary findings. Reported And Signed By: Cori Razor, MD  [facility] Radiology and Biomedical Imaging XR Abdomen APResult Date: 7/21/2022Contrast has reached the cecum. Recommend continued follow-up imaging. Report Initiated By:  Lenna Sciara, MD Reported And Signed By: Levonne Spiller, MD [facility] Radiology and Biomedical Imaging XR Abdomen AP w oral contrast (Eval for SBO)Result Date: 7/20/2022SBO. Reported And Signed By: Gary Angola, MD  [facility] Radiology and Biomedical Imaging [place] Abdomen Pelvis with IV Contrast without oral (BMI>25)Result Date: 7/19/2022Recurrent acute high-grade small bowel obstruction with a similar transition point along the ventral abdominal wall in the mid-abdomen, likely related to adhesions from prior hernia repair. Findings were discussed with Dr. Aldean Jewett at 4:45 am on 11/12/20. Report Initiated By:  Bertram Denver, MD Reported And Signed By: Dion Saucier, MD  [facility] Radiology and Biomedical Imaging Diet:  low fiberMobility: Highest Level of mobility - ACTUAL: Mobility Level 8, Walk 250+ feet AM PAC 24  Physical Exam Discharge vitals: Temp:  [36.4 ?C-37 ?C] 36.7 ?CPulse:  [61-81] 81Resp:  [15-20] 20BP: (136-186)/(67-79) 152/74SpO2:  [96 %-100 %] 96 %Device (Oxygen Therapy): room air Pertinent Findings of Physical Exam: UnremarkableCognitive Status at Discharge: BaselineDischarge Physical Exam:Physical ExamGen: Alert, NADCV: Regular rate, nl s1/s2Pulm: Non-labored breathing, CTA b/l, on room airAbd: soft, non distended, non tender, previous midline incision well healed. No guarding, no rebound, non peritoniticExt: No LE edema Skin: WWP, left ankle ROM normalAllergies Allergies Allergen Reactions ? Morphine Hives and Itching   Pt tolerated morphine during admission 08/08/16-08/09/16.  Pt premedicated with diphenhydramine ? Biaxin  [Clarithromycin]  ? Latex, Natural Rubber Rash  PMH PSH Past Medical History: Diagnosis Date ? Acute ischemic colitis (HC Code) (HC CODE) (HC Code) 2015 ? Depression  ? Diabetes mellitus (HC Code) (HC CODE) (HC Code)  ? Hypercholesteremia  ? Lupus anticoagulant disorder (HC Code) (HC CODE) (HC Code)  ? Rosacea  ? Ventral hernia with bowel obstruction  ? Vertigo   Past Surgical History: Procedure Laterality Date ? ABDOMINAL ADHESION SURGERY  2016 ? BLEPHAROPLASTY Bilateral 05/2016 ? COLOSTOMY  05/27/13 ? COLOSTOMY CLOSURE   ? HYSTEROSCOPY W/ POLYPECTOMY  06/18/2020 ? KNEE LIGAMENT RECONSTRUCTION Right 2004 ? LAPAROSCOPIC NISSEN FUNDOPLICATION    2000 ? ? SINUS SURGERY   ? THYROID BIOPSY   ? TUBAL LIGATION   ? UMBILICAL HERNIA REPAIR   ? VENTRAL HERNIA REPAIR    Social History Family History Social History Tobacco Use ? Smoking status: Never Smoker ? Smokeless tobacco: Never Used Substance Use Topics ? Alcohol use: Not Currently  Family History Problem Relation Age of Onset ? Bladder cancer Mother  ? Colon cancer Paternal Aunt  ? Colon cancer Cousin       died of colona cancer at age 46 ? Breast cancer Cousin       2 cousins with breast cancer ? Colon cancer Father   Discharge Medications: Discharge: Current Discharge Medication List  CONTINUE these medications which have NOT CHANGED  Details acetaminophen (TYLENOL) 500 mg tablet Take 1 tablet (500 mg total) by mouth every 4 (four) hours as needed.Qty: 30 tablet, Refills: 11  biotin 1 mg tablet Take 1 tablet by mouth daily.   cholecalciferol, vitamin D3, (VITAMIN D3) 5,000 unit Tab Take 1 tablet by mouth every other day..  diphenhydramine HCl (BENADRYL ALLERGY ORAL) Take 1 tablet by mouth as needed.  gabapentin (NEURONTIN) 100 mg capsule Take 100 mg by mouth at bedtime.  JARDIANCE 25 mg tablet 1 TABLET BY MOUTH ONCE  DAILY IN THE MORNING FOR 30 DAYS  meclizine (ANTIVERT) 12.5 mg tablet Take 1 tablet (12.5 mg total) by mouth daily as needed for DizzinessQty: 30 tablet, Refills: 1  Associated Diagnoses: Vertigo  melatonin 10 mg Tab Take 10 mg by mouth nightly as needed.   multivitamin tablet Take 1 tablet by mouth daily.  olopatadine (PATADAY) 0.2 % ophthalmic solution Place 1 drop into both eyes daily.Qty: 2.5 mL, Refills: 6  Associated Diagnoses: Allergic conjunctivitis of both eyes  omega-3 fatty acids 1,000 mg capsule Take 2 g by mouth daily.  ondansetron (ZOFRAN) 4 MG tablet Take 4 mg by mouth every 8 (eight) hours as needed for Nausea.  phytonadione, vit K1, (VITAMIN K) 100 mcg tablet Take 100 mcg by mouth daily.  polyethylene glycol (MIRALAX) 17 gram packet Take 1 packet (17 g total) by mouth daily. Mix in 8 ounces of water, juice, soda, coffee or tea prior to taking.Qty: 14 each, Refills: 2  prasterone, dhea,-calcium carb (DHEA) 10 mg-47 mg calcium Tab Take by mouth.  pravastatin (PRAVACHOL) 40 MG tablet Take 60 mg by mouth nightly.   senna (SENOKOT) 8.6 mg tablet Take 2 tablets by mouth nightly.Rob Bunting: 30 tablet, Refills: 11  Associated Diagnoses: SBO (small bowel obstruction) (HC Code) (HC CODE) (HC Code)  sitaGLIPtin (JANUVIA) 100 MG tablet Take 100 mg by mouth daily..  traMADoL (ULTRAM) 50 mg tablet Take 1 tablet (50 mg total) by mouth every 4 (four) hours as needed for pain for up to 7 doses.Qty: 7 tablet, Refills: 0  venlafaxine (EFFEXOR XR) 150 mg XR 24 hr extended release capsule Take 150 mg by mouth daily.   warfarin (COUMADIN) 5 mg tablet TAKE 1 TABLET BY MOUTH  DAILY @1800Qty : 30 tablet, Refills: 0   There was at total of approximately 30 minutes spent on the discharge of this patientElectronically Signed:Mariella Blackwelder Gatha Mayer, MD PGY1Anesthesiology Intern7/23/2022

## 2020-11-16 NOTE — Plan of Care
Maria [name] was discharged via [employer] accompanied by Other.  Verbalized understanding of discharge instructionsand recommended follow up care as per the after visit summary.  Written discharge instructions provided. Denies any further questions. Vital signs    Vitals:  11/15/20 2257 11/16/20 0545 11/16/20 0810 11/16/20 1157 BP: (!) 160/79 136/67 (!) 149/77 (!) 152/74 Pulse: 66 61 81 81 Resp: 15 18 20 20  Temp: 98.2 ?F (36.8 ?C) 98.6 ?F (37 ?C) 97.6 ?F (36.4 ?C) 98 ?F (36.7 ?C) TempSrc: Oral Oral Oral Oral SpO2: 100% 98% 99% 96% Weight:     Height:     Patient confirmed all belongings returned. Belongings charted in last 7 days: Valuable(s) : Clothing (11/12/2020  6:00 PM) Plan of Care Overview/ Patient Status    AOx4, VSS, on RAAdvanced to low fiber diet, tolerating wellAmbulating independently up in hallwayVoiding spontaneouslyNo reports of painWill continue to follow POC. See FS

## 2020-11-16 NOTE — Plan of Care
Problem: Adult Inpatient Plan of CareGoal: Readiness for Transition of CareOutcome: Outcome(s) achieved Plan of Care Overview/ Patient Status    ]]Patient is medically ready for discharge per provider and  in agreement.No post acute care services requested.Pvt car Diplomatic Services operational officer Row Most Recent Value Discharge Planning  Mode of Transportation  Private car  (add comment for special considerations) CM D/C Readiness  PASRR completed and approved N/A Authorization number obtained, if required N/A Is there a 3 day INPATIENT Qualifying stay for Medicare Patients? Yes Medicare IM- signed, dated, timed and scanned, if required Yes DME Authorized/Delivered N/A No needs identified/ follow up with PCP/MD N/A Post acute care services secured W10 complete N/A Pri Completed and Accepted  N/A Is the destination address correct on the W10 N/A Finalized Plan  Expected Discharge Date 11/16/20 Discharge Disposition Home-Health Care Svc  Naomie Dean, RN [facility] Care Management DepartmentP: [phone]: [phone]

## 2020-11-16 NOTE — Discharge Instructions
Follow up with Dr. Deniece Portela in 2 weeks. Resume coumadin and follow up with coumadin clinic.

## 2020-11-16 NOTE — Plan of Care
Plan of Care Overview/ Patient Status    Shift: 1900-0700Neuro: Alert and orient x4Resp: RACardiac: High BP noted, MD awareGI: Clear liquid diet, abd soft and non distended, + flatus and stool reportedGU: Void spont wellMS:  Able to move all extSkin: IntactPain: denies painIV: IntactHourly rounding preformed See flow sheet for further details Becky Sax, RN

## 2020-11-16 NOTE — Progress Notes
S:She is having mild  abdominal pain.She is not having nausea or vomiting. She  is NPO.She is passing gas and is having bowel movements.O:Temp:  [36.4 ?C-37 ?C] 36.4 ?CPulse:  [61-89] 81Resp:  [15-20] 20BP: (136-186)/(67-79) 149/77SpO2:  [98 %-100 %] 99 %Abdomen: On inspection is not distended, is tympanitic, is appropriately tenderI/O last 3 completed shifts:In: 3403 [P.O.:240; I.V.:3148; IV Piggyback:15]Out: 300 [Urine:300]A:Patient is a 70 year old woman with PMH of DM, HTN, depression, lupus anticoagulant disorder (on coumadin at home) and an extensive surgical history and recurrent SBO who is on hospital day 4 for a SBO. She is passing gas but has not had BM. Subjectively feels more comfortable. An NGT is placed. P:- LFD- Start 5mg  coumadin today- DC today- DVT Prophylaxis: Sequential Compression Device, Novella Olive, MD PGY1Anesthesiology Intern7/23/2022Contact:Colorectal Floor Pager: [phone]

## 2020-11-19 ENCOUNTER — Ambulatory Visit: Admit: 2020-11-19 | Payer: PRIVATE HEALTH INSURANCE | Attending: Foot & Ankle Surgery | Primary: Internal Medicine

## 2020-11-19 ENCOUNTER — Encounter: Admit: 2020-11-19 | Payer: PRIVATE HEALTH INSURANCE | Attending: Foot & Ankle Surgery | Primary: Internal Medicine

## 2020-11-19 ENCOUNTER — Telehealth: Admit: 2020-11-19 | Payer: PRIVATE HEALTH INSURANCE | Attending: Pharmacotherapy | Primary: Internal Medicine

## 2020-11-19 DIAGNOSIS — K436 Other and unspecified ventral hernia with obstruction, without gangrene: Secondary | ICD-10-CM

## 2020-11-19 DIAGNOSIS — F32A Depression: Secondary | ICD-10-CM

## 2020-11-19 DIAGNOSIS — L719 Rosacea, unspecified: Secondary | ICD-10-CM

## 2020-11-19 DIAGNOSIS — R42 Dizziness and giddiness: Secondary | ICD-10-CM

## 2020-11-19 DIAGNOSIS — E119 Type 2 diabetes mellitus without complications: Secondary | ICD-10-CM

## 2020-11-19 DIAGNOSIS — W19XXXA Unspecified fall, initial encounter: Secondary | ICD-10-CM

## 2020-11-19 DIAGNOSIS — S93492A Sprain of other ligament of left ankle, initial encounter: Secondary | ICD-10-CM

## 2020-11-19 DIAGNOSIS — D6862 Lupus anticoagulant syndrome: Secondary | ICD-10-CM

## 2020-11-19 DIAGNOSIS — E7800 Pure hypercholesterolemia, unspecified: Secondary | ICD-10-CM

## 2020-11-19 DIAGNOSIS — K55039 Acute (reversible) ischemia of large intestine, extent unspecified: Secondary | ICD-10-CM

## 2020-11-19 NOTE — Telephone Encounter
Spoke to [place] today.  She was discharged from the hospital on 11/16/20 and has been taking warfarin 5mg  daily.  She is going to have her INR checked at the lab tomorrow, 11/20/20 when she goes to see her doctor.

## 2020-11-20 ENCOUNTER — Inpatient Hospital Stay: Admit: 2020-11-20 | Discharge: 2020-11-20 | Payer: PRIVATE HEALTH INSURANCE | Primary: Internal Medicine

## 2020-11-20 DIAGNOSIS — Z5181 Encounter for therapeutic drug level monitoring: Secondary | ICD-10-CM

## 2020-11-20 DIAGNOSIS — Z7901 Long term (current) use of anticoagulants: Secondary | ICD-10-CM

## 2020-11-20 DIAGNOSIS — K559 Vascular disorder of intestine, unspecified: Secondary | ICD-10-CM

## 2020-11-20 DIAGNOSIS — D6862 Lupus anticoagulant syndrome: Secondary | ICD-10-CM

## 2020-11-20 LAB — PROTIME AND INR
BKR INR: 3.05 — ABNORMAL HIGH (ref 0.87–1.14)
BKR PROTHROMBIN TIME: 29.8 seconds — ABNORMAL HIGH (ref 9.6–12.3)

## 2020-11-21 ENCOUNTER — Encounter: Admit: 2020-11-21 | Payer: PRIVATE HEALTH INSURANCE | Attending: Pharmacotherapy | Primary: Internal Medicine

## 2020-11-21 ENCOUNTER — Ambulatory Visit: Admit: 2020-11-21 | Payer: PRIVATE HEALTH INSURANCE | Attending: Pharmacotherapy | Primary: Internal Medicine

## 2020-11-21 DIAGNOSIS — D6862 Lupus anticoagulant syndrome: Secondary | ICD-10-CM

## 2020-11-21 DIAGNOSIS — E119 Type 2 diabetes mellitus without complications: Secondary | ICD-10-CM

## 2020-11-21 DIAGNOSIS — K436 Other and unspecified ventral hernia with obstruction, without gangrene: Secondary | ICD-10-CM

## 2020-11-21 DIAGNOSIS — K559 Vascular disorder of intestine, unspecified: Secondary | ICD-10-CM

## 2020-11-21 DIAGNOSIS — R42 Dizziness and giddiness: Secondary | ICD-10-CM

## 2020-11-21 DIAGNOSIS — K55039 Acute (reversible) ischemia of large intestine, extent unspecified: Secondary | ICD-10-CM

## 2020-11-21 DIAGNOSIS — F32A Depression: Secondary | ICD-10-CM

## 2020-11-21 DIAGNOSIS — L719 Rosacea, unspecified: Secondary | ICD-10-CM

## 2020-11-21 DIAGNOSIS — E7800 Pure hypercholesterolemia, unspecified: Secondary | ICD-10-CM

## 2020-11-26 ENCOUNTER — Ambulatory Visit: Admit: 2020-11-26 | Payer: PRIVATE HEALTH INSURANCE | Attending: Medical Oncology | Primary: Internal Medicine

## 2020-11-26 ENCOUNTER — Encounter: Admit: 2020-11-26 | Payer: PRIVATE HEALTH INSURANCE | Attending: Medical Oncology | Primary: Internal Medicine

## 2020-11-26 ENCOUNTER — Ambulatory Visit
Admit: 2020-11-26 | Payer: PRIVATE HEALTH INSURANCE | Attending: Pharmacist Clinician (PhC)/ Clinical Pharmacy Specialist | Primary: Internal Medicine

## 2020-11-26 ENCOUNTER — Inpatient Hospital Stay: Admit: 2020-11-26 | Discharge: 2020-11-26 | Payer: PRIVATE HEALTH INSURANCE | Primary: Internal Medicine

## 2020-11-26 DIAGNOSIS — D6862 Lupus anticoagulant syndrome: Secondary | ICD-10-CM

## 2020-11-26 DIAGNOSIS — Z7901 Long term (current) use of anticoagulants: Secondary | ICD-10-CM

## 2020-11-26 DIAGNOSIS — K559 Vascular disorder of intestine, unspecified: Secondary | ICD-10-CM

## 2020-11-26 DIAGNOSIS — Z5181 Encounter for therapeutic drug level monitoring: Secondary | ICD-10-CM

## 2020-11-26 LAB — PROTIME AND INR
BKR INR: 7.05 — CR (ref 0.87–1.14)
BKR PROTHROMBIN TIME: 63.3 s — ABNORMAL HIGH (ref 9.6–12.3)

## 2020-11-26 NOTE — Progress Notes
Called regarding INR of 7. I asked that the patient hold coumadin today and call clinic tmw. At this point I am not sure what she is taking. We will need to clarify. I asked that she call back if any concerns. [name] MD

## 2020-11-26 NOTE — Progress Notes
Pharmacist Anticoagulation Clinic Telephone Visit Follow Maria [name] (1951-02-12) is on chronic anticoagulation for the diagnosis of  Lupus anticoagulant with hypercoagulable state (HC Code) (HC CODE) (HC Code)  (primary encounter diagnosis) Ischemic colitis (HC Code) with an INR goal of 2.0-3.0.  The patient was contacted regarding lab drawn INR results.  Patient's INR findings and prescribed dose from last visit:Anticoagulation Monitoring Latest Ref Rng & Units 11/07/2020 11/21/2020 11/26/2020 INR 2 - 3 - - - INR 0.91 - 1.10 - - - INR (External) 0.8 - 1.15 - - - Assoc. INR Date - 11/07/2020 11/20/2020 11/26/2020 Associated INR - 1.46 3.05 7.05 Instructions - 7/14: 7.5 mg; Otherwise 5 mg every day 7.5 mg every Sun; 5 mg all other days 8/2: Hold; 8/3: Hold; Otherwise 7.5 mg every Sun; 5 mg all other days Last 7 day dose total - 25 mg 27.5 mg 37.5 mg Next INR Date - 11/12/2020 11/25/2020 11/28/2020 The following findings were reported from today's call (no findings if none selected):[]  Missed doses []  Extra doses []  Change in medications []  Change in vitamin k rich food intake (green vegetables, herbals, etc) []  Change in alcohol use []  Change in overall health []  Recent hospitalization / emergency department visit []  Upcoming procedure (including dental procedures) []  Other Comments:       Stopped taking vitamin K 100 mcg daily The following side effects were reported from today's call (no side effects if none selected):[]  Bleeding []  New bruising []  Warfarin-emergency department visit or hospital admission []  Other Comments:   Reports a lot of bruising on her arms and legs and one on stomach (but reports bruising easily) Assessment/Plan:Patient's INR is 7.05, which is above goal range of 2.0-3.0.  Patient reports she stopped taking her vit K 100 mcg daily from the time of her admission until presently - this is the reason for her INR being this high. Also she's been eating less vegetables (vitamin K rich foods) currently and her appetite is diminished, same as last week. Patient also states that she will be traveling out of state from 8/14-9/6. She was instructed to not take any warfarin for 2 days as indicated in the maintenance plan below.  Next INR assessment due in 2 days. This information was routed to the covering provider for review.Anticoagulation Summary  As of 11/26/2020  INR goal:  2.0-3.0 TTR:  45.3 % (6.5 y) INR used for dosing:  7.05 (11/26/2020) Warfarin maintenance plan:  7.5 mg (5 mg x 1.5) every Sun; 5 mg (5 mg x 1) all other days Weekly warfarin total:  37.5 mg Plan last modified:  Markus Daft, [facility] (11/21/2020) Next INR check:  11/28/2020 Priority:  High Priority Target end date:  Indefinite  Indications  Lupus anticoagulant with hypercoagulable state (HC Code) (HC CODE) (HC Code) [D68.62]Ischemic colitis (HC Code) [K55.9]   Anticoagulation Episode Summary   INR check location:    Preferred lab:    Send INR reminders to:  [facility] INR POOL  Comments:    Anticoagulation Care Providers   Provider Role Specialty Phone number  [name], MD Referring Medical Oncology [phone]  Florian Buff [name], APRN Responsible Medical Oncology [phone]  All patient's medications have been reviewed and updated as needed.  Electronically Signed by [name], PharmD, November 26, 2020 [facility] Antico[facility]  [address] [place], [place] [address]  Phone:[phone]  Fax: [phone]

## 2020-11-27 ENCOUNTER — Encounter: Admit: 2020-11-27 | Payer: PRIVATE HEALTH INSURANCE | Attending: Surgery of the Hand | Primary: Internal Medicine

## 2020-11-30 ENCOUNTER — Encounter: Admit: 2020-11-30 | Payer: PRIVATE HEALTH INSURANCE | Attending: Foot & Ankle Surgery | Primary: Internal Medicine

## 2020-11-30 DIAGNOSIS — L719 Rosacea, unspecified: Secondary | ICD-10-CM

## 2020-11-30 DIAGNOSIS — K55039 Acute (reversible) ischemia of large intestine, extent unspecified: Secondary | ICD-10-CM

## 2020-11-30 DIAGNOSIS — R42 Dizziness and giddiness: Secondary | ICD-10-CM

## 2020-11-30 DIAGNOSIS — F32A Depression: Secondary | ICD-10-CM

## 2020-11-30 DIAGNOSIS — E7800 Pure hypercholesterolemia, unspecified: Secondary | ICD-10-CM

## 2020-11-30 DIAGNOSIS — D6862 Lupus anticoagulant syndrome: Secondary | ICD-10-CM

## 2020-11-30 DIAGNOSIS — K436 Other and unspecified ventral hernia with obstruction, without gangrene: Secondary | ICD-10-CM

## 2020-11-30 DIAGNOSIS — E119 Type 2 diabetes mellitus without complications: Secondary | ICD-10-CM

## 2020-12-01 NOTE — Progress Notes
[facility]	Podiatric Clinic NoteDate: 7/26/2022Patient Name: Maria RiveraMRN: [id number] Attending Provider: Cena Benton, MDCC: Left ankle sprainSubjective: HPI: Maria [name] is a 70 y.o. female w/ pmhx T2DM (last HbA1c 7.5 08/14/16), vertigo presents to podiatry clinic after sustaining L ankle sprain from tripping in her home 10/27/20. Pt presented to [facility] ED and recommended to use air splint and crutches. Pt sustained no fracture and states moderate improvement in pain 9/10 at time of injury now 5/10 mainly with ambulation. Pt has applied lotions, cold/warm compresses, elevation of legs, and wears ankle brace along uses a cane. She states some instability with her gait given the ankle sprain and vertigo but has not tripped nor fallen since initial injury.  Denies n/v/f/c/sob/cp.PMH: PSH: Past Medical History: Diagnosis Date ? Acute ischemic colitis (HC Code) (HC CODE) (HC Code) 2015 ? Depression  ? Diabetes mellitus (HC Code) (HC CODE) (HC Code)  ? Hypercholesteremia  ? Lupus anticoagulant disorder (HC Code) (HC CODE) (HC Code)  ? Rosacea  ? Ventral hernia with bowel obstruction  ? Vertigo   Past Surgical History: Procedure Laterality Date ? ABDOMINAL ADHESION SURGERY  2016 ? BLEPHAROPLASTY Bilateral 05/2016 ? COLOSTOMY  05/27/13 ? COLOSTOMY CLOSURE   ? HYSTEROSCOPY W/ POLYPECTOMY  06/18/2020 ? KNEE LIGAMENT RECONSTRUCTION Right 2004 ? LAPAROSCOPIC NISSEN FUNDOPLICATION    2000 ? ? SINUS SURGERY   ? THYROID BIOPSY   ? TUBAL LIGATION   ? UMBILICAL HERNIA REPAIR   ? VENTRAL HERNIA REPAIR    Social History: Family History: Social History Tobacco Use ? Smoking status: Never Smoker ? Smokeless tobacco: Never Used Substance Use Topics ? Alcohol use: Not Currently  Family History Problem Relation Age of Onset ? Bladder cancer Mother  ? Colon cancer Paternal Aunt  ? Colon cancer Cousin       died of colona cancer at age 54 ? Breast cancer Cousin       2 cousins with breast cancer ? Colon cancer Father   Allergies: Morphine; Biaxin  [clarithromycin]; and Latex, natural rubberReview of Systems: Constitutional Symptoms: denies fever, chills, fatigue, unexplained fallsEyes: denies recent changes in vision, eye painEars/Nose/Mouth/Throat: denies congestionCardiovascular: denies chest pain, SOB, claudicationRespiratory: denies cough, wheeze, SOBGastrointestinal: denies abdominal painMusculoskeletal: reports pains at L ankleIntegumentary: denies skin abnormalities at BLENeurologic: denies dizziness, light-headedness; denies neuropathyAllergies/Immunologic: Morphine; Biaxin  [clarithromycin]; and Latex, natural rubberVitals: Vitals:  11/19/20 0829 BP: 120/66 Pulse: (!) 94 Resp: 18 Temp: 97.2 ?F (36.2 ?C) Physical Exam: Gen: NADHNT: NormocephalicPsych: Cooperative, AAOx3Chest: CTAB, no r/r/wCardio: RRR, S1, S2, no m/r/gAbd: Soft NT, ND, +bsVascular: DP/PT pulses palpable. Normal CRT to all digits. Foot is normal to touch. Pedal hair growth is present. Mild non-pitting edema lateral rearfoot L>RNeuro: Protective sensation is normal 10/10 with SWMFMSK: Negative anterior drawer and talar tilt b/l. 5/5 muscle strength in all quadrants with mild pain upon eversion/inversion L foot; TTP at anterolateral/medial ankle joint. No TTP Achilles tendon.BIOM:AJ DF: 10 degrees, PF 60 degree bilateralSTJ: 20deg INV/ 10 EVDerm: Normal texture and turgor.Labs: Lab Results Component Value Date  WBC 9.7 11/16/2020  HGB 13.5 11/16/2020  HCT 43.30 11/16/2020  CREATININE 0.57 11/16/2020  BUN 8 11/16/2020  CO2 24 11/16/2020  PLT 337 11/16/2020  INR 2.15 (H) 11/16/2020  GLU 212 (H) 11/16/2020  HGBA1C 7.5 (H) 08/14/2016  K 4.0 11/16/2020  NA 138 11/16/2020  CL 100 11/16/2020  ALT 18 11/11/2020  AST 27 11/11/2020  TSH 1.180 02/07/2016  SEDRATE 46 (H) 08/14/2016 Imaging: XR L ankle 7/3/22IMPRESSION:Posterior ankle joint effusion with soft tissue  swelling predominantly laterally. No displaced fracture or dislocation.Assessment/Plan: 70 y.o. female with T2DM, vertigo presents with L low ankle sprain (ATFL and CFL) after tripping 10/27/20 with continued gait instability. No e/o fracture - Recommend WBAT with ankle brace and cane- Physical therapy referral ([facility]) placed for gait/balance training and possible use of different assistive device (x2/week for 2 months)- Recommend c/w RICE therapy- DM foot exam performed- Home DM foot care reviewedRTC 3 months for T2DM foot and L ankle check Seen and discussed with attending, Dr. Jacinto Reap Signed by Remi Haggard To, MD, November 19, 2020 I saw and evaluated Maria [name] with the resident. I agree with the findings and the plan of care as documented in the resident note.Electronically Signed by Donn Pierini, DPM, November 19, 2020

## 2020-12-04 ENCOUNTER — Telehealth: Admit: 2020-12-04 | Payer: PRIVATE HEALTH INSURANCE | Attending: Gastroenterology | Primary: Internal Medicine

## 2020-12-04 ENCOUNTER — Ambulatory Visit: Admit: 2020-12-04 | Payer: PRIVATE HEALTH INSURANCE | Attending: Gastroenterology | Primary: Internal Medicine

## 2020-12-04 ENCOUNTER — Encounter: Admit: 2020-12-04 | Payer: PRIVATE HEALTH INSURANCE | Attending: Surgery of the Hand | Primary: Internal Medicine

## 2020-12-04 ENCOUNTER — Ambulatory Visit: Admit: 2020-12-04 | Payer: PRIVATE HEALTH INSURANCE | Attending: Surgery of the Hand | Primary: Internal Medicine

## 2020-12-04 DIAGNOSIS — D6862 Lupus anticoagulant syndrome: Secondary | ICD-10-CM

## 2020-12-04 DIAGNOSIS — F32A Depression: Secondary | ICD-10-CM

## 2020-12-04 DIAGNOSIS — G5602 Carpal tunnel syndrome, left upper limb: Secondary | ICD-10-CM

## 2020-12-04 DIAGNOSIS — K436 Other and unspecified ventral hernia with obstruction, without gangrene: Secondary | ICD-10-CM

## 2020-12-04 DIAGNOSIS — K55039 Acute (reversible) ischemia of large intestine, extent unspecified: Secondary | ICD-10-CM

## 2020-12-04 DIAGNOSIS — L719 Rosacea, unspecified: Secondary | ICD-10-CM

## 2020-12-04 DIAGNOSIS — M65341 Trigger finger, right ring finger: Secondary | ICD-10-CM

## 2020-12-04 DIAGNOSIS — R42 Dizziness and giddiness: Secondary | ICD-10-CM

## 2020-12-04 DIAGNOSIS — K559 Vascular disorder of intestine, unspecified: Secondary | ICD-10-CM

## 2020-12-04 DIAGNOSIS — E7800 Pure hypercholesterolemia, unspecified: Secondary | ICD-10-CM

## 2020-12-04 DIAGNOSIS — E119 Type 2 diabetes mellitus without complications: Secondary | ICD-10-CM

## 2020-12-04 NOTE — Progress Notes
ZO:XWRU CTS, right ring trigger fingerHPI:Maria [name] is a  70 y.o. female who is status post right ring trigger finger pain and locking. Pain started around August 2021. Patient received steroid injection to right ring finger on 02/14/2020. Patient is status post ventral hernia repair with mesh surgery with Dr. [name], DOS: 04/09/2020. Today patient reports her right ring finger triggering is coming back again now. She states getting one steroid shot in [place] orthopedic which did not help and she later had another steroid shot with me on 02/14/2020. She also reports pain and numbness in her left hand. She states that she wears a glove for the pain. She also states that she has another hernia. However, she states that Dr. [name] and Dr. Deniece Portela decided that hernia surgery is invasive and recommended against performing that. She states that she is going to [place] to see her grandson and [place] to see her sister and then on to [facility]. She is planning to move to [facility]. Review of Systems Constitutional: Negative for chills and fever. Musculoskeletal:      Left hand pain. Right ring finger triggering Skin: Negative for itching and rash. Neurological:      Left hand numbness  All other systems reviewed and are negative.Occupational History ? Not on file Allergies:She is allergic to morphine; biaxin  [clarithromycin]; and latex, natural rubber.Medications:She has a current medication list which includes the following prescription(s): acetaminophen (TYLENOL) 500 mg tablet - Take 1 tablet (500 mg total) by mouth every 4 (four) hours as needed, biotin 1 mg tablet - Take 1 tablet by mouth daily, cholecalciferol, vitamin D3, 125 mcg (5,000 unit) tablet - Take 1 tablet by mouth every other day., diphenhydramine HCl (BENADRYL ALLERGY ORAL) - Take 1 tablet by mouth as needed, gabapentin (NEURONTIN) 100 mg capsule - Take 100 mg by mouth at bedtime, JARDIANCE 25 mg tablet - 1 TABLET BY MOUTH ONCE DAILY IN THE MORNING FOR 30 DAYS, meclizine (ANTIVERT) 12.5 mg tablet - Take 1 tablet (12.5 mg total) by mouth daily as needed for Dizziness, melatonin 10 mg Tab - Take 10 mg by mouth nightly as needed, multivitamin tablet - Take 1 tablet by mouth daily, olopatadine (PATADAY) 0.2 % ophthalmic solution - Place 1 drop into both eyes daily, ondansetron (ZOFRAN) 4 MG tablet - Take 4 mg by mouth every 8 (eight) hours as needed for Nausea, phytonadione, vit K1, (VITAMIN K) 100 mcg tablet - Take 100 mcg by mouth daily, polyethylene glycol (MIRALAX) 17 gram packet - Take 1 packet (17 g total) by mouth daily. Mix in 8 ounces of water, juice, soda, coffee or tea prior to taking, prasterone, dhea,-calcium carb (DHEA) 10 mg-47 mg calcium Tab - Take by mouth, pravastatin (PRAVACHOL) 40 MG tablet - Take 60 mg by mouth nightly, senna (SENOKOT) 8.6 mg tablet - Take 2 tablets by mouth nightly., sitaGLIPtin (JANUVIA) 100 MG tablet - Take 100 mg by mouth daily., venlafaxine (EFFEXOR XR) 150 mg XR 24 hr extended release capsule - Take 150 mg by mouth daily, warfarin (COUMADIN) 5 mg tablet - TAKE 1 TABLET BY MOUTH  DAILY @1800 , omega-3 fatty acids 1,000 mg capsule - Take 2 g by mouth daily (Patient not taking: No sig reported), and traMADoL (ULTRAM) 50 mg tablet - Take 1 tablet (50 mg total) by mouth every 4 (four) hours as needed for pain for up to 7 doses (Patient not taking: No sig reported).Medical History:She  has a past medical history of Acute ischemic colitis (HC Code) (HC CODE) (  HC Code) (2015), Depression, Diabetes mellitus (HC Code) (HC CODE) (HC Code), Hypercholesteremia, Lupus anticoagulant disorder (HC Code) (HC CODE) (HC Code), Rosacea, Ventral hernia with bowel obstruction, and Vertigo.Surgical History: She  has a past surgical history that includes Knee ligament reconstruction (Right, 2004); Laparoscopic Nissen fundoplication; Umbilical hernia repair; Ventral hernia repair; Tubal ligation; THYROID BIOPSY; Colostomy (05/27/13); Sinus surgery; Abdominal adhesion surgery (2016); Blepharoplasty (Bilateral, 05/2016); Colostomy closure; and Hysteroscopy w/ polypectomy (06/18/2020). Social History:She  reports that she has never smoked. She has never used smokeless tobacco. She reports previous alcohol use. She reports that she does not use drugs.Family History:Her family history includes Bladder cancer in her mother; Breast cancer in her cousin; Colon cancer in her cousin, father, and paternal aunt. Exam: Constitutional: Maria [name] is in no distress.Skin: Warm and dry.Neck: normal Bilateral hands: She has triggering of right ring finger today. There is negative Tinel's sign on the left hand and positive Phalen's sign on the left. Data Reviewed:previous medical recordsDiagnosis:1. Trigger ring finger of right hand   2. Carpal tunnel syndrome of left wrist    We will plan for an EMG/NCS test for left carpal tunnelSince patient already received two steroid injections, I recommend trigger release surgery at this point. Patient stated that she would like the surgery done when she is back from her trip in mid September. We are going to perform right ring trigger finger release surgery under local anesthesia. The risks and benefits of surgery were discussed with the patient. The risks include infection, bleeding, nerve injury, numbness, pain, abnormal scarring and recurrence. The patient signed the consent form. My office will contact the patient to schedule the surgery.Follow-Up: Patient will follow up with me in ORScribed for Iverson Alamin, MD by Carlyon Shadow, medical scribe August 10, 2022The documentation recorded by the scribe accurately reflects the services I personally performed and the decisions made by me. I reviewed and confirmed all material entered and/or pre-charted by the scribe.

## 2020-12-04 NOTE — Progress Notes
Pharmacist Anticoagulation Clinic Telephone Visit Follow Maria [name] (Nov 01, 1950) is on chronic anticoagulation for the diagnosis of  Lupus anticoagulant with hypercoagulable state (HC Code) (HC CODE) (HC Code)  (primary encounter diagnosis) Ischemic colitis (HC Code) with an INR goal of 2.0-3.0.  The patient was contacted regarding lab drawn INR results.  Patient's INR findings and prescribed dose from last visit:Anticoagulation Monitoring Latest Ref Rng & Units 11/21/2020 11/26/2020 12/04/2020 INR 2 - 3 - - - INR 0.91 - 1.10 - - - INR (External) 0.8 - 1.15 - - - Assoc. INR Date - 11/20/2020 11/26/2020 - Associated INR - 3.05 7.05 - Instructions - 7.5 mg every Sun; 5 mg all other days 8/2: Hold; 8/3: Hold; Otherwise 7.5 mg every Sun; 5 mg all other days 8/10: Hold; Otherwise 7.5 mg every Sun; 5 mg all other days Last 7 day dose total - 27.5 mg 37.5 mg 20 mg Next INR Date - 11/25/2020 11/28/2020 12/05/2020 The following findings were reported from today's call (no findings if none selected):[x]  Missed doses, as requested Maria [name] HELD her dose on 08/02 and 08/03 []  Extra doses [x]  Change in medications, resumed Vitamin K on 08/02 []  Change in vitamin k rich food intake (green vegetables, herbals, etc) []  Change in alcohol use []  Change in overall health []  Recent hospitalization / emergency department visit []  Upcoming procedure (including dental procedures) []  Other Comments:        The following side effects were reported from today's call (no side effects if none selected):[]  Bleeding []  New bruising []  Warfarin-emergency department visit or hospital admission []  Other Comments:   No side effects Assessment/Plan:Contacted Maria [name]  to follow up on her elevated INR of 7 from 08/02 as no f/u INR is in system.  She was asked at the time  to HOLD 2 doses (08/02 and 08/03) and recheck her INR on 08/04. Maria [name] confirms she did go get her INR checked on Thursday 08/04 but since she didn't hear from anyone she resumed her warfarin at a reduced dose. To note, I informed Maria [name] we do not have an updated INR from 08/04. She took 2.5mg  Thursday-Sunday and then 5mg  on Monday and Tuesday and Wednesday and will recheck tomorrow 08/11. In the event we do not call her I encouraged Maria [name] to call the clinic to follow up on her results and plan. . This information was routed to the covering provider for review.Anticoagulation Summary  As of 12/04/2020  INR goal:  2.0-3.0 TTR:  45.3 % (6.5 y) INR used for dosing:  No new INR was available at the time of this encounter. Warfarin maintenance plan:  7.5 mg (5 mg x 1.5) every Sun; 5 mg (5 mg x 1) all other days Weekly warfarin total:  37.5 mg Plan last modified:  Markus Daft, [facility] (11/21/2020) Next INR check:  12/05/2020 Priority:  High Priority Target end date:  Indefinite  Indications  Lupus anticoagulant with hypercoagulable state (HC Code) (HC CODE) (HC Code) [D68.62]Ischemic colitis (HC Code) [K55.9]   Anticoagulation Episode Summary   INR check location:    Preferred lab:    Send INR reminders to:  [facility] INR POOL  Comments:    Anticoagulation Care Providers   Provider Role Specialty Phone number  [name], MD Referring Medical Oncology [phone]  Florian Buff [name], APRN Responsible Medical Oncology [phone]  All patient's medications have been reviewed and updated as needed.  Electronically Signed by Robin Searing, PharmD, December 04, 2020 [facility] Anticoagulation Clinic  [address] [place], [place] [address]  Phone:[phone]  Fax: [phone]

## 2020-12-05 ENCOUNTER — Inpatient Hospital Stay: Admit: 2020-12-05 | Discharge: 2020-12-05 | Payer: PRIVATE HEALTH INSURANCE | Primary: Internal Medicine

## 2020-12-05 ENCOUNTER — Encounter: Admit: 2020-12-05 | Payer: PRIVATE HEALTH INSURANCE | Attending: Pharmacotherapy | Primary: Internal Medicine

## 2020-12-05 ENCOUNTER — Ambulatory Visit: Admit: 2020-12-05 | Payer: PRIVATE HEALTH INSURANCE | Attending: Pharmacotherapy | Primary: Internal Medicine

## 2020-12-05 DIAGNOSIS — E119 Type 2 diabetes mellitus without complications: Secondary | ICD-10-CM

## 2020-12-05 DIAGNOSIS — K559 Vascular disorder of intestine, unspecified: Secondary | ICD-10-CM

## 2020-12-05 DIAGNOSIS — R42 Dizziness and giddiness: Secondary | ICD-10-CM

## 2020-12-05 DIAGNOSIS — Z7901 Long term (current) use of anticoagulants: Secondary | ICD-10-CM

## 2020-12-05 DIAGNOSIS — K55039 Acute (reversible) ischemia of large intestine, extent unspecified: Secondary | ICD-10-CM

## 2020-12-05 DIAGNOSIS — D6862 Lupus anticoagulant syndrome: Secondary | ICD-10-CM

## 2020-12-05 DIAGNOSIS — E7800 Pure hypercholesterolemia, unspecified: Secondary | ICD-10-CM

## 2020-12-05 DIAGNOSIS — L719 Rosacea, unspecified: Secondary | ICD-10-CM

## 2020-12-05 DIAGNOSIS — Z5181 Encounter for therapeutic drug level monitoring: Secondary | ICD-10-CM

## 2020-12-05 DIAGNOSIS — K436 Other and unspecified ventral hernia with obstruction, without gangrene: Secondary | ICD-10-CM

## 2020-12-05 DIAGNOSIS — F32A Depression: Secondary | ICD-10-CM

## 2020-12-05 LAB — PROTIME AND INR
BKR INR: 2.46 — ABNORMAL HIGH (ref 0.87–1.14)
BKR PROTHROMBIN TIME: 24.5 seconds — ABNORMAL HIGH (ref 9.6–12.3)

## 2020-12-06 ENCOUNTER — Telehealth: Admit: 2020-12-06 | Payer: PRIVATE HEALTH INSURANCE | Attending: Medical Oncology | Primary: Internal Medicine

## 2020-12-06 MED ORDER — WARFARIN 5 MG TABLET
5 mg | ORAL_TABLET | Freq: Every day | ORAL | 4 refills | Status: AC
Start: 2020-12-06 — End: 2022-02-10

## 2020-12-06 NOTE — Telephone Encounter
Received call from patient, she would like a call back from [place] or Beth in the Warfarin clinic, please contact patient at [phone], if patient does not answer please call [phone]

## 2020-12-09 ENCOUNTER — Telehealth: Admit: 2020-12-09 | Payer: PRIVATE HEALTH INSURANCE | Attending: Medical Oncology | Primary: Internal Medicine

## 2020-12-09 NOTE — Telephone Encounter
Patient wanted to pass this info on to INR Clinic, she also would like a return [phone] [place] [place], [place] [id number] [phone]

## 2020-12-10 NOTE — Progress Notes
Pharmacist Anticoagulation Clinic Telephone Visit Follow Maria [name] (03-01-1951) is on chronic anticoagulation for the diagnosis of  Lupus anticoagulant with hypercoagulable state (HC Code) (HC CODE) (HC Code)  (primary encounter diagnosis) Ischemic colitis (HC Code) with an INR goal of 2.0-3.0.  The patient was contacted regarding lab drawn INR results.  Patient's INR findings and prescribed dose from last visit:Anticoagulation Monitoring Latest Ref Rng & Units 11/26/2020 12/04/2020 12/05/2020 INR 2 - 3 - - - INR 0.91 - 1.10 - - - INR (External) 0.8 - 1.15 - - - Assoc. INR Date - 11/26/2020 - 12/05/2020 Associated INR - 7.05 - 2.46 Instructions - 8/2: Hold; 8/3: Hold; Otherwise 7.5 mg every Sun; 5 mg all other days 7.5 mg every Sun; 5 mg all other days 2.5 mg every Mon, Wed; 5 mg all other days Last 7 day dose total - 37.5 mg 20 mg 25 mg Next INR Date - 11/28/2020 12/05/2020 12/11/2020 The following findings were reported from today's call (no findings if none selected):[]  Missed doses []  Extra doses []  Change in medications []  Change in vitamin k rich food intake (green vegetables, herbals, etc) []  Change in alcohol use []  Change in overall health []  Recent hospitalization / emergency department visit []  Upcoming procedure (including dental procedures) []  Other Comments:       Restarted vitamin K 100 mcg daily as instructed last week The following side effects were reported from today's call (no side effects if none selected):[]  Bleeding []  New bruising []  Warfarin-emergency department visit or hospital admission []  Other Comments:   No side effects Assessment/Plan:Patient's INR is 2.46, which is within goal range of 2.0-3.0.  Patient reports she's still been having a low appetite and eating less green vegetables; eating less overall.  She was instructed to continue taking warfarin 5 mg daily as indicated in the maintenance plan below.  Next INR assessment due 1 week. This information was routed to the covering provider for review. Patient is going to [place], [place] and [place] on Sunday for the next 3 weeks, back on 9/3. Patient was asked to send [place] fax number for whichever [facility] she will go to next Wednesday and we will fax them the requisition. [facility] [address] [place], [place] [phone] Anticoagulation Summary  As of 12/05/2020  INR goal:  2.0-3.0 TTR:  45.3 % (6.5 y) INR used for dosing:  2.46 (12/05/2020) Warfarin maintenance plan:  2.5 mg (5 mg x 0.5) every Thu; 5 mg (5 mg x 1) all other days Weekly warfarin total:  32.5 mg Plan last modified:  [name], PharmD (12/06/2020) Next INR check:  12/11/2020 Priority:  High Priority Target end date:  Indefinite  Indications  Lupus anticoagulant with hypercoagulable state (HC Code) (HC CODE) (HC Code) [D68.62]Ischemic colitis (HC Code) [K55.9]   Anticoagulation Episode Summary   INR check location:    Preferred lab:    Send INR reminders to:  [facility] INR POOL  Comments:    Anticoagulation Care Providers   Provider Role Specialty Phone number  [name], MD Responsible Medical Oncology [phone]  [name], [place] Responsible Medical Oncology [phone]  All patient's medications have been reviewed and updated as needed.  Electronically Signed by [name], PharmD, December 06, 2020 [facility] Anticoagulation Clinic  [address] [place], [place] [address]  Phone:[phone]  Fax: [phone]

## 2020-12-10 NOTE — Telephone Encounter
PT/INR order sent to [facility] lab that patient specified. Returned patient's call but she didn't pick up. [name], PharmD, BCPSClinical Pharmacist II, Anticoagulation ClinicSmilow Tr[facility] and [facility]

## 2020-12-11 ENCOUNTER — Encounter
Admit: 2020-12-11 | Payer: PRIVATE HEALTH INSURANCE | Attending: Vascular and Interventional Radiology | Primary: Internal Medicine

## 2020-12-11 LAB — INR (ABSTRACTED)
EXPIRATION DATE: 3 — AB (ref .8–1.15)
INR (ABSTRACTED): 3.9 — AB (ref .8–1.15)

## 2020-12-12 ENCOUNTER — Ambulatory Visit
Admit: 2020-12-12 | Payer: PRIVATE HEALTH INSURANCE | Attending: Pharmacist Clinician (PhC)/ Clinical Pharmacy Specialist | Primary: Internal Medicine

## 2020-12-12 ENCOUNTER — Emergency Department (HOSPITAL_COMMUNITY): Payer: Medicare Other

## 2020-12-12 ENCOUNTER — Inpatient Hospital Stay (HOSPITAL_COMMUNITY): Payer: Medicare Other

## 2020-12-12 ENCOUNTER — Encounter (HOSPITAL_COMMUNITY): Payer: Self-pay | Admitting: Emergency Medicine

## 2020-12-12 ENCOUNTER — Inpatient Hospital Stay (HOSPITAL_COMMUNITY)
Admission: EM | Admit: 2020-12-12 | Discharge: 2020-12-13 | DRG: 389 | Disposition: A | Discharge status: Home / Self Care | Payer: Medicare Other | Attending: Internal Medicine | Admitting: Internal Medicine

## 2020-12-12 ENCOUNTER — Other Ambulatory Visit: Payer: Self-pay

## 2020-12-12 DIAGNOSIS — Z91040 Latex allergy status: Secondary | ICD-10-CM

## 2020-12-12 DIAGNOSIS — Z7901 Long term (current) use of anticoagulants: Secondary | ICD-10-CM

## 2020-12-12 DIAGNOSIS — Z885 Allergy status to narcotic agent status: Secondary | ICD-10-CM | POA: Diagnosis not present

## 2020-12-12 DIAGNOSIS — K5652 Intestinal adhesions [bands] with complete obstruction: Secondary | ICD-10-CM | POA: Diagnosis present

## 2020-12-12 DIAGNOSIS — I10 Essential (primary) hypertension: Secondary | ICD-10-CM | POA: Diagnosis present

## 2020-12-12 DIAGNOSIS — Z79899 Other long term (current) drug therapy: Secondary | ICD-10-CM | POA: Diagnosis not present

## 2020-12-12 DIAGNOSIS — R109 Unspecified abdominal pain: Secondary | ICD-10-CM | POA: Diagnosis present

## 2020-12-12 DIAGNOSIS — Z8719 Personal history of other diseases of the digestive system: Secondary | ICD-10-CM | POA: Diagnosis not present

## 2020-12-12 DIAGNOSIS — Z20822 Contact with and (suspected) exposure to covid-19: Secondary | ICD-10-CM | POA: Diagnosis present

## 2020-12-12 DIAGNOSIS — E119 Type 2 diabetes mellitus without complications: Secondary | ICD-10-CM | POA: Diagnosis present

## 2020-12-12 DIAGNOSIS — Z881 Allergy status to other antibiotic agents status: Secondary | ICD-10-CM | POA: Diagnosis not present

## 2020-12-12 DIAGNOSIS — D689 Coagulation defect, unspecified: Secondary | ICD-10-CM

## 2020-12-12 DIAGNOSIS — K56609 Unspecified intestinal obstruction, unspecified as to partial versus complete obstruction: Secondary | ICD-10-CM | POA: Diagnosis present

## 2020-12-12 DIAGNOSIS — K432 Incisional hernia without obstruction or gangrene: Secondary | ICD-10-CM | POA: Diagnosis present

## 2020-12-12 DIAGNOSIS — F32A Depression, unspecified: Secondary | ICD-10-CM | POA: Diagnosis present

## 2020-12-12 DIAGNOSIS — D6862 Lupus anticoagulant syndrome: Secondary | ICD-10-CM | POA: Diagnosis present

## 2020-12-12 DIAGNOSIS — E785 Hyperlipidemia, unspecified: Secondary | ICD-10-CM | POA: Diagnosis present

## 2020-12-12 DIAGNOSIS — R1033 Periumbilical pain: Secondary | ICD-10-CM

## 2020-12-12 DIAGNOSIS — Z7984 Long term (current) use of oral hypoglycemic drugs: Secondary | ICD-10-CM

## 2020-12-12 DIAGNOSIS — Z0189 Encounter for other specified special examinations: Secondary | ICD-10-CM

## 2020-12-12 DIAGNOSIS — K559 Vascular disorder of intestine, unspecified: Secondary | ICD-10-CM

## 2020-12-12 HISTORY — DX: Lupus anticoagulant syndrome: D68.62

## 2020-12-12 LAB — INR (ABSTRACTED): INR (ABSTRACTED): 3 — AB (ref .8–1.15)

## 2020-12-12 LAB — CBC WITH DIFFERENTIAL/PLATELET
Abs Immature Granulocytes: 0.09 10*3/uL — ABNORMAL HIGH (ref 0.00–0.07)
Basophils Absolute: 0.1 10*3/uL (ref 0.0–0.1)
Basophils Relative: 0 %
Eosinophils Absolute: 0.1 10*3/uL (ref 0.0–0.5)
Eosinophils Relative: 0 %
HCT: 47 % — ABNORMAL HIGH (ref 36.0–46.0)
Hemoglobin: 14.9 g/dL (ref 12.0–15.0)
Immature Granulocytes: 0 %
Lymphocytes Relative: 11 %
Lymphs Abs: 2.1 10*3/uL (ref 0.7–4.0)
MCH: 26.3 pg (ref 26.0–34.0)
MCHC: 31.7 g/dL (ref 30.0–36.0)
MCV: 82.9 fL (ref 80.0–100.0)
Monocytes Absolute: 0.8 10*3/uL (ref 0.1–1.0)
Monocytes Relative: 4 %
Neutro Abs: 17 10*3/uL — ABNORMAL HIGH (ref 1.7–7.7)
Neutrophils Relative %: 85 %
Platelets: 440 10*3/uL — ABNORMAL HIGH (ref 150–400)
RBC: 5.67 MIL/uL — ABNORMAL HIGH (ref 3.87–5.11)
RDW: 16 % — ABNORMAL HIGH (ref 11.5–15.5)
WBC: 20.2 10*3/uL — ABNORMAL HIGH (ref 4.0–10.5)
nRBC: 0 % (ref 0.0–0.2)

## 2020-12-12 LAB — APTT: aPTT: 50 seconds — ABNORMAL HIGH (ref 24–36)

## 2020-12-12 LAB — COMPREHENSIVE METABOLIC PANEL
ALT: 17 U/L (ref 0–44)
AST: 21 U/L (ref 15–41)
Albumin: 4.1 g/dL (ref 3.5–5.0)
Alkaline Phosphatase: 103 U/L (ref 38–126)
Anion gap: 15 (ref 5–15)
BUN: 11 mg/dL (ref 8–23)
CO2: 26 mmol/L (ref 22–32)
Calcium: 10.8 mg/dL — ABNORMAL HIGH (ref 8.9–10.3)
Chloride: 96 mmol/L — ABNORMAL LOW (ref 98–111)
Creatinine, Ser: 0.82 mg/dL (ref 0.44–1.00)
GFR, Estimated: >60 mL/min (ref >60)
Glucose, Bld: 249 mg/dL — ABNORMAL HIGH (ref 70–99)
Potassium: 4.2 mmol/L (ref 3.5–5.1)
Sodium: 137 mmol/L (ref 135–145)
Total Bilirubin: 0.6 mg/dL (ref 0.3–1.2)
Total Protein: 8.2 g/dL — ABNORMAL HIGH (ref 6.5–8.1)

## 2020-12-12 LAB — PROTIME-INR
INR: 3 — ABNORMAL HIGH (ref 0.8–1.2)
Prothrombin Time: 31.1 seconds — ABNORMAL HIGH (ref 11.4–15.2)

## 2020-12-12 LAB — RESP PANEL BY RT-PCR (FLU A&B, COVID) ARPGX2
Influenza A by PCR: NEGATIVE
Influenza B by PCR: NEGATIVE
SARS Coronavirus 2 by RT PCR: NEGATIVE

## 2020-12-12 LAB — LIPASE, BLOOD: Lipase: 28 U/L (ref 11–51)

## 2020-12-12 LAB — CBG MONITORING, ED
Glucose-Capillary: 97 mg/dL (ref 70–99)
Glucose-Capillary: 88 mg/dL (ref 70–99)

## 2020-12-12 LAB — LACTIC ACID, PLASMA: Lactic Acid, Venous: 1.9 mmol/L (ref 0.5–1.9)

## 2020-12-12 MED ORDER — ONDANSETRON HCL 4 MG/2ML IJ SOLN
4.0000 mg | Freq: Four times a day (QID) | INTRAMUSCULAR | Status: DC | PRN
  Administered 2020-12-12: 4 mg via INTRAVENOUS
  Filled 2020-12-12: qty 2

## 2020-12-12 MED ORDER — DIATRIZOATE MEGLUMINE & SODIUM 66-10 % PO SOLN
90.0000 mL | Freq: Once | ORAL | Status: AC
  Administered 2020-12-12: 90 mL via NASOGASTRIC
  Filled 2020-12-12: qty 90

## 2020-12-12 MED ORDER — HYDROMORPHONE HCL 1 MG/ML IJ SOLN
0.5000 mg | Freq: Once | INTRAMUSCULAR | Status: AC
  Administered 2020-12-12: 0.5 mg via INTRAVENOUS
  Filled 2020-12-12: qty 1

## 2020-12-12 MED ORDER — ONDANSETRON HCL 4 MG PO TABS: 4.0000 mg | ORAL_TABLET | Freq: Four times a day (QID) | ORAL | Status: DC | PRN

## 2020-12-12 MED ORDER — IOHEXOL 350 MG/ML SOLN
75.0000 mL | Freq: Once | INTRAVENOUS | Status: AC | PRN
  Administered 2020-12-12: 75 mL via INTRAVENOUS

## 2020-12-12 MED ORDER — ACETAMINOPHEN 325 MG PO TABS: 650.0000 mg | ORAL_TABLET | Freq: Four times a day (QID) | ORAL | Status: DC | PRN

## 2020-12-12 MED ORDER — LACTATED RINGERS IV SOLN
INTRAVENOUS | Status: AC
  Administered 2020-12-12: 1000 mL via INTRAVENOUS

## 2020-12-12 MED ORDER — INSULIN ASPART 100 UNIT/ML IJ SOLN
0.0000 [IU] | Freq: Three times a day (TID) | INTRAMUSCULAR | Status: DC
Start: 2020-12-12 — End: 2020-12-14

## 2020-12-12 MED ORDER — ONDANSETRON HCL 4 MG/2ML IJ SOLN
4.0000 mg | Freq: Once | INTRAMUSCULAR | Status: AC
  Administered 2020-12-12: 4 mg via INTRAVENOUS
  Filled 2020-12-12: qty 2

## 2020-12-12 MED ORDER — HYDROMORPHONE HCL 1 MG/ML IJ SOLN
1.0000 mg | Freq: Once | INTRAMUSCULAR | Status: AC
  Administered 2020-12-12: 1 mg via INTRAVENOUS
  Filled 2020-12-12: qty 1

## 2020-12-12 MED ORDER — ACETAMINOPHEN 650 MG RE SUPP: 650.0000 mg | Freq: Four times a day (QID) | RECTAL | Status: DC | PRN

## 2020-12-12 MED ORDER — SODIUM CHLORIDE 0.9% FLUSH
3.0000 mL | Freq: Two times a day (BID) | INTRAVENOUS | Status: DC
  Administered 2020-12-12 – 2020-12-13 (×2): 3 mL via INTRAVENOUS

## 2020-12-12 MED ORDER — DIPHENHYDRAMINE HCL 50 MG/ML IJ SOLN
25.0000 mg | Freq: Four times a day (QID) | INTRAMUSCULAR | Status: DC | PRN
  Administered 2020-12-12: 25 mg via INTRAVENOUS
  Filled 2020-12-12 (×2): qty 1

## 2020-12-12 MED ORDER — HYDROMORPHONE HCL 1 MG/ML IJ SOLN
0.5000 mg | INTRAMUSCULAR | Status: DC | PRN
Start: 2020-12-12 — End: 2020-12-14
  Administered 2020-12-12 – 2020-12-13 (×3): 0.5 mg via INTRAVENOUS
  Filled 2020-12-12: qty 0.5
  Filled 2020-12-12: qty 1
  Filled 2020-12-12: qty 0.5

## 2020-12-12 MED ORDER — LACTATED RINGERS IV BOLUS
1000.0000 mL | Freq: Once | INTRAVENOUS | Status: AC
  Administered 2020-12-12: 1000 mL via INTRAVENOUS

## 2020-12-12 MED ORDER — DIPHENHYDRAMINE HCL 25 MG PO CAPS: 25.0000 mg | ORAL_CAPSULE | Freq: Four times a day (QID) | ORAL | Status: DC | PRN

## 2020-12-12 NOTE — Progress Notes
Pharmacist Anticoagulation Clinic Telephone Visit Follow Maria [name] ([birth year]) is on chronic anticoagulation for the diagnosis of  Lupus anticoagulant with hypercoagulable state (HC Code) (HC CODE) (HC Code)  (primary encounter diagnosis) Ischemic colitis (HC Code) with an INR goal of 2.0-3.0.  The patient was contacted regarding lab drawn INR results.  Patient's INR findings and prescribed dose from last visit:Anticoagulation Monitoring Latest Ref Rng & Units 12/05/2020 12/11/2020 12/12/2020 INR 2 - 3 - - - INR 0.91 - 1.10 - - - INR (External) 0.8 - 1.15 - 3.90(A) - Assoc. INR Date - 12/05/2020 - 12/11/2020 Associated INR - 2.46 - 3.90 Instructions - 8/11: 5 mg; Otherwise 2.5 mg every Thu; 5 mg all other days - 2.5 mg every Thu; 5 mg all other days Last 7 day dose total - 25 mg - 35 mg Next INR Date - 12/11/2020 - 12/18/2020 The following findings were reported from today's call (no findings if none selected):[]  Missed doses []  Extra doses []  Change in medications []  Change in vitamin k rich food intake (green vegetables, herbals, etc) []  Change in alcohol use []  Change in overall health [x]  Recent hospitalization / emergency department visit []  Upcoming procedure (including dental procedures) []  Other Comments:       Patient admitted to [facility], [place], [place] for small bowel obstruction: Pt complains of severe abdominal pain, nausea, and vomiting. She reports she has had a history of multiple bowel obstructions, ischemic colitis. She recently underwent surgery for a hernia repair in December. She endorses anorexia over the last day, but was not having acute pain until this evening. Patient is NPO due to bowel obstruction  The following side effects were reported from today's call (no side effects if none selected):[]  Bleeding []  New bruising []  Warfarin-emergency department visit or hospital admission []  Other Comments: No side effects Assessment/Plan:Patient's INR is 3.9, which is above goal range of 2.0-3.0. Per ED pharmacist note from 12/12/20: Plan: Patient is NPO and per surgery recommendation to hold warfarin and use either heparin infusion/lovenox injection in meantime. At present, INR is 3 so will be holding any AC, have notified team of this. -f/u INR over next few days and start heparin or lovenox based on team preference.-PTA warfarin regimen 5mg  PO daily, last dose 8/16 per medication reconciliation.At this time we defer anticoagulation management to inpatient team. Patient did not answer phone. Will follow up with patient to resume warfarin management once she is discharged. Anticoagulation Summary  As of 12/12/2020  INR goal:  2.0-3.0 TTR:  45.3 % (6.5 y) INR used for dosing:  3.90 (12/11/2020) Warfarin maintenance plan:  2.5 mg (5 mg x 0.5) every Thu; 5 mg (5 mg x 1) all other days Weekly warfarin total:  32.5 mg Plan last modified:  [name], PharmD (12/06/2020) Next INR check:  12/18/2020 Priority:  High Priority Target end date:  Indefinite  Indications  Lupus anticoagulant with hypercoagulable state (HC Code) (HC CODE) (HC Code) [D68.62]Ischemic colitis (HC Code) [K55.9]   Anticoagulation Episode Summary   INR check location:    Preferred lab:    Send INR reminders to:  [facility] INR POOL  Comments:    Anticoagulation Care Providers   Provider Role Specialty Phone number  [name], MD Responsible Medical Oncology [phone]  [name], [place] Responsible Medical Oncology [phone]  All patient's medications have been reviewed and updated as needed.  Electronically Signed by [name], PharmD, December 12, 2020 [facility]  Anticoagulation Clinic  [address], [place], [place] [address]  Phone:[phone]  Fax: [phone]

## 2020-12-12 NOTE — Evaluation (Signed)
Physical Therapy Evaluation Patient Details Name: Terri Morales MRN: [id number] DOB: 05-27-1950 Today's Date: 12/12/2020   History of Present Illness  Pt is a 70 y/o female admitted 8/18 secondary to abdominal pain and vomiting. Found to have SBO and NG tube was placed. PMH includes multiple abdominal surgeries, DM, ischemic colitis.  Clinical Impression  Pt admitted secondary to problem above with deficits below. Pt requiring min guard A for mobility tasks. Distance limited as pt connected to suction for NG tube. Pt reports she recently sprained her L ankle and plans to follow up with PT on an outpatient basis when she returns home to [place]. Will continue to follow acutely to maximize functional mobility independence and safety.      Follow Up Recommendations Outpatient PT (follow up with outpatient PT for R ankle as planned upon return home)    Equipment Recommendations  None recommended by PT    Recommendations for Other Services       Precautions / Restrictions Precautions Precautions: Other (comment) Precaution Comments: NG tube Restrictions Weight Bearing Restrictions: No      Mobility  Bed Mobility Overal bed mobility: Needs Assistance Bed Mobility: Supine to Sit;Sit to Supine     Supine to sit: Min assist Sit to supine: Supervision   General bed mobility comments: Min A for trunk assist to come to sitting. Increased time required.    Transfers Overall transfer level: Needs assistance Equipment used: None Transfers: Sit to/from Stand Sit to Stand: Min guard         General transfer comment: Min guard for safety to stand from higher stretcher.  Ambulation/Gait Ambulation/Gait assistance: Min guard Gait Distance (Feet): 10 Feet Assistive device: None Gait Pattern/deviations: Step-through pattern;Decreased stride length Gait velocity: Decreased   General Gait Details: only able to ambulate short distance as pt connected to NG tube. Min guard for  safety. Educated about importance of mobility throughout hospital stay.  Stairs            Wheelchair Mobility    Modified Rankin (Stroke Patients Only)       Balance Overall balance assessment: Needs assistance Sitting-balance support: No upper extremity supported;Feet supported Sitting balance-Leahy Scale: Good     Standing balance support: No upper extremity supported;During functional activity Standing balance-Leahy Scale: Fair                               Pertinent Vitals/Pain Pain Assessment: Faces Faces Pain Scale: Hurts a little bit Pain Location: abdomen Pain Descriptors / Indicators: Aching Pain Intervention(s): Limited activity within patient's tolerance;Monitored during session;Repositioned    Home Living Family/patient expects to be discharged to:: Private residence Living Arrangements: Children;Other relatives (mother in law) Available Help at Discharge: Family Type of Home: House Home Access: Stairs to enter Entrance Stairs-Rails: Right Entrance Stairs-Number of Steps: 2 Home Layout: Multi-level Home Equipment: Emergency planning/management officer - 2 wheels;Cane - single point;Grab bars - tub/shower;Bedside commode      Prior Function Level of Independence: Independent with assistive device(s)         Comments: Uses cane because she sprained L ankle     Hand Dominance        Extremity/Trunk Assessment   Upper Extremity Assessment Upper Extremity Assessment: Defer to OT evaluation    Lower Extremity Assessment Lower Extremity Assessment: LLE deficits/detail LLE Deficits / Details: reports recent L ankle sprain    Cervical / Trunk Assessment Cervical / Trunk Assessment: Normal  Communication   Communication: No difficulties  Cognition Arousal/Alertness: Awake/alert Behavior During Therapy: WFL for tasks assessed/performed Overall Cognitive Status: Within Functional Limits for tasks assessed                                         General Comments General comments (skin integrity, edema, etc.): Pt's husband present during session.    Exercises     Assessment/Plan    PT Assessment Patient needs continued PT services  PT Problem List Decreased strength;Decreased mobility;Decreased activity tolerance;Pain       PT Treatment Interventions DME instruction;Gait training;Stair training;Functional mobility training;Therapeutic activities;Balance training;Therapeutic exercise;Patient/family education    PT Goals (Current goals can be found in the Care Plan section)  Acute Rehab PT Goals Patient Stated Goal: to go home PT Goal Formulation: With patient Time For Goal Achievement: 12/26/20 Potential to Achieve Goals: Good    Frequency Min 3X/week   Barriers to discharge        Co-evaluation               AM-PAC PT "6 Clicks" Mobility  Outcome Measure Help needed turning from your back to your side while in a flat bed without using bedrails?: None Help needed moving from lying on your back to sitting on the side of a flat bed without using bedrails?: A Little Help needed moving to and from a bed to a chair (including a wheelchair)?: A Little Help needed standing up from a chair using your arms (e.g., wheelchair or bedside chair)?: A Little Help needed to walk in hospital room?: A Little Help needed climbing 3-5 steps with a railing? : A Little 6 Click Score: 19    End of Session Equipment Utilized During Treatment: Gait belt Activity Tolerance: Patient tolerated treatment well Patient left: in bed;with call bell/phone within reach (on stretcher in ED) Nurse Communication: Mobility status PT Visit Diagnosis: Other abnormalities of gait and mobility (R26.89)    Time: 8416-6063 PT Time Calculation (min) (ACUTE ONLY): 12 min   Charges:   PT Evaluation $PT Eval Low Complexity: 1 Low          Cindee Salt, DPT  Acute Rehabilitation Services  Pager: [phone] Office:  [phone]   Lehman Prom 12/12/2020, 4:15 PM

## 2020-12-12 NOTE — Progress Notes (Signed)
ANTICOAGULATION CONSULT NOTE - Initial Consult  Pharmacy Consult for warfarin Indication:  Lupus  Allergies  Allergen Reactions   Clarithromycin Other (See Comments)    unsure   Morphine And Related Hives   Latex Rash    Patient Measurements: Height: 4\' 10"  (147.3 cm) Weight: 75.8 kg (167 lb 1.7 oz) IBW/kg (Calculated) : 40.9  Vital Signs: Temp: 97.6 F (36.4 C) (08/18 0540) Temp Source: Oral (08/18 0540) BP: 149/60 (08/18 0817) Pulse Rate: 100 (08/18 0817)  Labs: Recent Labs    12/12/20 0445 12/12/20 0700  HGB 14.9  --   HCT 47.0*  --   PLT 440*  --   APTT  --  50*  LABPROT  --  31.1*  INR  --  3.0*  CREATININE 0.82  --     Estimated Creatinine Clearance: 55.3 mL/min (by C-G formula based on SCr of 0.82 mg/dL).   Medical History: Past Medical History:  Diagnosis Date   Abdominal hernia    Diabetes mellitus without complication (HCC)    Embolism (HCC)    Hyperlipemia    Ischemic colitis (HCC)    Lupus anticoagulant disorder (HCC)    Vertigo    Assessment: 70 yo F with centralized abd pain that begun yesterday, last BM yesterday. CT done in ED and reveled SBO. Hx of SBO in the past requiring admission. PMHx DM, HLD, depression, and Lupus anticoagulant disorder (PTA warfarin)  INR 3.0 this AM  Goal of Therapy:  INR 2-3 Monitor platelets by anticoagulation protocol: Yes   Plan: Patient is NPO and per surgery recommendation to hold warfarin and use either heparin infusion/lovenox injection in meantime. At present, INR is 3 so will be holding any AC, have notified team of this.  -f/u INR over next few days and start heparin or lovenox based on team preference. -PTA warfarin regimen 5mg  PO daily, last dose 8/16 per medication reconciliation.  65, PharmD, [facility] Emergency Medicine Clinical Pharmacist ED RPh Phone: [phone] Main RX: [phone]

## 2020-12-12 NOTE — ED Triage Notes (Signed)
Pt has abdominal pain and vomiting.  Extensive hx of surgeries on stomach.

## 2020-12-12 NOTE — ED Provider Notes (Signed)
Emergency Medicine Provider Triage Evaluation Note  Terri Morales , a 70 y.o. female  was evaluated in triage.  Pt complains of severe abdominal pain, nausea, and vomiting. She reports she has had a history of multiple bowel obstructions, ischemic colitis. She recently underwent surgery for a hernia repair in December. She endorses anorexia over the last day, but was not having acute pain until this evening. Patient reports she is on an anti coagulant. She endorses chills. She denies chest pain, shortness of breath, dysuria.  Review of Systems  Positive: Abdominal pain, nausea, vomiting Negative: Fever, chest pain, shortness of breath  Physical Exam  BP (!) 172/149 (BP Location: Right Arm)   Pulse 98   Temp 97.6 F (36.4 C)   Resp 20   SpO2 99%  Gen:   Moaning and writhing in pain, in acute distress Resp:  Normal effort other than distress MSK:   Moves extremities without difficulty Other:  Exquisitely tender to palpation of abdomen, belching and vomiting actively while in triage  Medical Decision Making  Medically screening exam initiated at 4:38 AM.  Appropriate orders placed.  Terri Morales was informed that the remainder of the evaluation will be completed by another provider, this initial triage assessment does not replace that evaluation, and the importance of remaining in the ED until their evaluation is complete.  Acute abdominal pain, history of bowel obstruction and ischemia   Terri Floss, PA-C 12/12/20 0442    Glynn Octave, MD 12/12/20 [phone]

## 2020-12-12 NOTE — Progress Notes (Deleted)
   Subjective: No acute overnight events. Patient was seen at bedside during rounds this morning. Pt reports feeling much better this AM. She has been passing flatus and has been having diarrhea. Pt still having some mild stomach pain around where her hernia is. Denies nausea or vomiting. She is waiting for breakfast this morning. No other complains or concerns at this time.   She has dealt with obstructions since she had ischemic colitis. She is aware that she needs to continue a liquid diet until she returns to [place].   Objective:  Vital signs in last 24 hours: Vitals:   12/12/20 1655 12/12/20 1716 12/12/20 1825 12/12/20 2104  BP: (!) 144/70  (!) 162/58 132/72  Pulse: 73  67 79  Resp: 14  16 20   Temp:  98.1 F (36.7 C) 98 F (36.7 C) 98.2 F (36.8 C)  TempSrc:  Oral Oral Oral  SpO2: 97%  99% 97%  Weight:      Height:       Constitutional: alert, well-appearing, in no acute distress HENT: normocephalic, atraumatic, mucous membranes moist Eyes: conjunctiva non-erythematous, extraocular movements intact Cardiovascular: regular rate and rhythm, no m/r/g Pulmonary/Chest: normal work of breathing on room air, lungs clear to auscultation bilaterally Abdomen: mild distension but soft; mild tenderness in periumbilical and epigastric region. Hypoactive bowel sounds. Reducible inferior incisional hernia  Neurological: alert & oriented x 3  Skin: warm and dry Psych: normal behavior, normal affect   Assessment/Plan:  Active Problems:   SBO (small bowel obstruction) (HCC)  Terri Morales is a 70 y.o. 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   SBO Patient with hx of prior abdominal surgeries  and SBO presenting with abdominal pain and N/V. Found to have a bowel obstruction on CT. Pt has a hx of ischemic colitis s/p colostomy and  reversal. CBC showed a mild leukocytosis that has resolved today. Patient tolerated NG tube overnight, with symptomatic improvement. She had flatus and BM. Benign physical exam findings. XR shows apparent of SBO, and NG tube removed, advanced to clear liquid diet. Possibly d/c today, if patient tolerates diet, continues to have flatus and BM, and pain must be reasonable.  -d/c IV fluids -clear liquid diet, advised pt to continue liquid diet until returning home to [place].   -tylenol and dilaudid for pain    Antiphospholipid syndrome on warfarin Hx of lupus anticoagulant with a programmable state on warfarin therapy.  She is followed at [facility] for her care. INR 3.0 at this time,  recommended range 2.0-3.0. Held warfarin during NPO state.  -restart coumadin per pharmacy consult.    T2DM Patient is on Jardiance 25 mg daily and Januvia 100 mg daily. Blood glucose 249 on arrival.   -SSI moderate -CBG monitoring   HLD Pt is on pravastatin at baseline.  -held due to NPO status    MDD Pt on venlafaxine for hx of depression.  -held due to NPO status      Best Practice: Diet: clear liquid diet IVF: none  VTE: none  Code: Full  Signature: [facility], MD  Internal Medicine Resident, PGY-1 [facility]  Pager: [phone] After 5pm on weekdays and 1pm on weekends: On Call pager [phone]

## 2020-12-12 NOTE — ED Provider Notes (Signed)
[facility] EMERGENCY DEPARTMENT Provider Note   CSN: [id number] Arrival date & time: 12/12/20  2505     History No chief complaint on file.   Terri Morales is a 70 y.o. female.  Patient with history of antiphospholipid syndrome, DM, HLD, ischemic colitis, previous bowel obstructions, s/p colostomy including take-down, presenting with severe periumbilical abdominal pain, nausea, vomiting that started at 6:30 pm yesterday (8/17) and has been progressive since onset. She states it feels the same as previous obstruction vs ischemia. She had a small bowel movement after symptom onset and had been passing gas, neither of which has occurred over the last 5-6 hours. No fever, chest pain, urinary symptoms SOB.   The history is provided by the patient. No language interpreter was used.      Past Medical History:  Diagnosis Date   Abdominal hernia    Diabetes mellitus without complication (HCC)    Embolism (HCC)    Hyperlipemia    Ischemic colitis [facility])    Vertigo     Patient Active Problem List   Diagnosis Date Noted   Antiphospholipid syndrome (HCC)    Lobar pneumonia (HCC) 12/24/2017   Parapneumonic effusion 12/24/2017   Community acquired pneumonia of left lower lobe of lung    Pleural effusion associated with pulmonary infection    Antiphospholipid antibody syndrome [facility])     Past Surgical History:  Procedure Laterality Date   ABDOMINAL SURGERY       OB History   No obstetric history on file.     No family history on file.  Social History   Tobacco Use   Smoking status: Never   Smokeless tobacco: Never    Home Medications Prior to Admission medications   Medication Sig Start Date End Date Taking? Authorizing Provider  amoxicillin (AMOXIL) 500 MG tablet Take 2 tablets (1,000 mg total) by mouth 3 (three) times daily. 12/27/17   Yvette Rack, MD  Cholecalciferol (VITAMIN D3) 5000 units TABS Take 1,000 Units by mouth daily.    [provider]  gabapentin (NEURONTIN) 100 MG capsule Take 200 mg by mouth at bedtime. 12/13/17   [provider]  ibuprofen (ADVIL,MOTRIN) 800 MG tablet Take 1 tablet (800 mg total) by mouth every 8 (eight) hours as needed for moderate pain. 12/27/17   Agyei, Hermina Staggers, MD  JANUVIA 100 MG tablet Take 100 mg by mouth daily. 12/03/17   [provider]  JARDIANCE 10 MG TABS tablet Take 10 mg by mouth daily. 12/02/17   [provider]  meclizine (ANTIVERT) 12.5 MG tablet Take 12.5 mg by mouth 3 (three) times daily as needed for dizziness. 12/03/17   [provider]  Melatonin 10 MG TABS Take 10 mg by mouth at bedtime as needed for sleep.    [provider]  Multiple Vitamin (MULTI-VITAMINS) TABS Take 1 tablet by mouth daily.    [provider]  Nutritional Supplements (DHEA PO) Take 1 tablet by mouth daily.    [provider]  ondansetron (ZOFRAN-ODT) 4 MG disintegrating tablet Take 4 mg by mouth every 6 (six) hours as needed for nausea/vomiting. 09/26/17   [provider]  pantoprazole (PROTONIX) 40 MG tablet Take 1 tablet (40 mg total) by mouth daily. 12/28/17   Yvette Rack, MD  pravastatin (PRAVACHOL) 40 MG tablet Take 40 mg by mouth at bedtime.    [provider]  senna (SENOKOT) 8.6 MG tablet Take 1 tablet by mouth at bedtime as needed  for constipation. 08/11/16   [provider]  venlafaxine XR (EFFEXOR-XR) 150 MG 24 hr capsule Take 150 mg by mouth daily. 12/06/17   [provider]  warfarin (COUMADIN) 2.5 MG tablet Take 5 mg by mouth every evening. Current dose 12/03/17   [provider]    Allergies    Clarithromycin, Morphine and related, and Latex  Review of Systems   Review of Systems  Constitutional:  Negative for chills and fever.  HENT: Negative.    Respiratory: Negative.    Cardiovascular: Negative.   Gastrointestinal:  Positive for abdominal distention, abdominal pain, nausea and vomiting.  Negative for blood in stool.       See HPI.  Genitourinary:  Negative for decreased urine volume and dysuria.  Musculoskeletal: Negative.   Skin: Negative.   Neurological: Negative.  Negative for weakness.  Psychiatric/Behavioral:  Negative for confusion.    Physical Exam Updated Vital Signs BP 116/76 (BP Location: Right Arm)   Pulse (!) 50   Temp 97.6 F (36.4 C) (Oral)   Resp 16   Ht 4\' 10"  (1.473 m)   Wt 75.8 kg   SpO2 99%   BMI 34.93 kg/m   Physical Exam Vitals and nursing note reviewed.  Constitutional:      Appearance: She is obese. She is not toxic-appearing.  HENT:     Mouth/Throat:     Mouth: Mucous membranes are dry.  Pulmonary:     Effort: Pulmonary effort is normal.     Breath sounds: No wheezing, rhonchi or rales.  Abdominal:     Comments: Obese abdomen, multiple abdominal wall surgical scars. Guarding. BS hypoactive.   Musculoskeletal:        General: Normal range of motion.  Skin:    General: Skin is warm and dry.  Neurological:     General: No focal deficit present.     Mental Status: She is oriented to person, place, and time.    ED Results / Procedures / Treatments   Labs (all labs ordered are listed, but only abnormal results are displayed) Labs Reviewed  CBC WITH DIFFERENTIAL/PLATELET - Abnormal; Notable for the following components:      Result Value   WBC 20.2 (*)    RBC 5.67 (*)    HCT 47.0 (*)    RDW 16.0 (*)    Platelets 440 (*)    Neutro Abs 17.0 (*)    Abs Immature Granulocytes 0.09 (*)    All other components within normal limits  COMPREHENSIVE METABOLIC PANEL - Abnormal; Notable for the following components:   Chloride 96 (*)    Glucose, Bld 249 (*)    Calcium 10.8 (*)    Total Protein 8.2 (*)    All other components within normal limits  LACTIC ACID, PLASMA  LIPASE, BLOOD  LACTIC ACID, PLASMA   Results for orders placed or performed during the hospital encounter of 12/12/20  Lactic acid, plasma  Result Value Ref Range    Lactic Acid, Venous 1.9 0.5 - 1.9 mmol/L  CBC with Differential  Result Value Ref Range   WBC 20.2 (H) 4.0 - 10.5 K/uL   RBC 5.67 (H) 3.87 - 5.11 MIL/uL   Hemoglobin 14.9 12.0 - 15.0 g/dL   HCT 12/14/20 (H) 16.0 - 73.7 %   MCV 82.9 80.0 - 100.0 fL   MCH 26.3 26.0 - 34.0 pg   MCHC 31.7 30.0 - 36.0 g/dL   RDW 10.6 (H) 26.9 - 48.5 %   Platelets 440 (  H) 150 - 400 K/uL   nRBC 0.0 0.0 - 0.2 %   Neutrophils Relative % 85 %   Neutro Abs 17.0 (H) 1.7 - 7.7 K/uL   Lymphocytes Relative 11 %   Lymphs Abs 2.1 0.7 - 4.0 K/uL   Monocytes Relative 4 %   Monocytes Absolute 0.8 0.1 - 1.0 K/uL   Eosinophils Relative 0 %   Eosinophils Absolute 0.1 0.0 - 0.5 K/uL   Basophils Relative 0 %   Basophils Absolute 0.1 0.0 - 0.1 K/uL   Immature Granulocytes 0 %   Abs Immature Granulocytes 0.09 (H) 0.00 - 0.07 K/uL  Comprehensive metabolic panel  Result Value Ref Range   Sodium 137 135 - 145 mmol/L   Potassium 4.2 3.5 - 5.1 mmol/L   Chloride 96 (L) 98 - 111 mmol/L   CO2 26 22 - 32 mmol/L   Glucose, Bld 249 (H) 70 - 99 mg/dL   BUN 11 8 - 23 mg/dL   Creatinine, Ser 6.07 0.44 - 1.00 mg/dL   Calcium 37.1 (H) 8.9 - 10.3 mg/dL   Total Protein 8.2 (H) 6.5 - 8.1 g/dL   Albumin 4.1 3.5 - 5.0 g/dL   AST 21 15 - 41 U/L   ALT 17 0 - 44 U/L   Alkaline Phosphatase 103 38 - 126 U/L   Total Bilirubin 0.6 0.3 - 1.2 mg/dL   GFR, Estimated >06 >26 mL/min   Anion gap 15 5 - 15  Lipase, blood  Result Value Ref Range   Lipase 28 11 - 51 U/L    EKG None  Radiology No results found.  Procedures Procedures   Medications Ordered in ED Medications  ondansetron (ZOFRAN) injection 4 mg (4 mg Intravenous Given 12/12/20 0533)  HYDROmorphone (DILAUDID) injection 1 mg (1 mg Intravenous Given 12/12/20 0533)  lactated ringers bolus 1,000 mL (1,000 mLs Intravenous New Bag/Given 12/12/20 0533)    ED Course  I have reviewed the triage vital signs and the nursing notes.  Pertinent labs & imaging results that were  available during my care of the patient were reviewed by me and considered in my medical decision making (see chart for details).    MDM Rules/Calculators/A&P                           Patient to ED with abdominal pain, N, V - h/o ischemic bowel, SBO, colostomy s/p take-down.   She appears uncomfortable. Abdomen distended and tender. Leukocytosis of 20. CTA abd/pel pending. She reports she is more comfortable after receiving IV pain and nausea medications. Pain "2/10" (however, appears more uncomfortable), nausea resolved.   Patient care signed out to oncoming provider team pending CT results.   Final Clinical Impression(s) / ED Diagnoses Final diagnoses:  None   Abdominal pain History SBO, ischemic colitis Coagulopathy   Rx / DC Orders ED Discharge Orders     None        Danne Harbor 12/12/20 9485    Linwood Dibbles, MD 12/13/20 [phone]

## 2020-12-12 NOTE — H&P (Signed)
Date: 12/12/2020               Patient Name:  Lorianne Malbrough MRN: [id number]  DOB: Apr 18, 1951 Age / Sex: 70 y.o., female   PCP: Patient, No Pcp Per (Inactive)         Medical Service: Internal Medicine Teaching Service         Attending Physician: Dr. Inez Catalina, MD    First Contact: Carmel Sacramento, MD Pager: MP [phone]  Second Contact: Merrilyn Puma, MD Pager: Nada Maclachlan [phone]       After Hours (After 5p/  First Contact Pager: [phone]  weekends / holidays): Second Contact Pager: [phone]   SUBJECTIVE  Chief Complaint: abdominal pain  History of Present Illness: Viktoriya Glaspy is a 70 y.o. female with a pertinent PMH of lupus anticoagulant on warfarin,  diabetes mellitus, hyperlipidemia and depression with prior history of ischemic colitis who presents to [facility] with abdominal pain.  Patient reports centralized abdominal pain around 1800 last night with nausea and dry heaving. She does note one episode of emesis of gastric contents but denies any hematemesis or bilious emesis.  She notes that her last bowel movement was yesterday and she is continuing to pass flatus.  She reports that her current episode feels similar to her prior small bowel obstructions.  On my exam, patient reports resolution of her abdominal pain after pain medication and NG tube placement.  She denies any recent fevers, chills, shortness of breath, chest pain, diarrhea or constipation.   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  This was later reversed.  However, patient has had multiple abdominal incisional hernias following this and has undergone 2-3 surgical hernia repairs.  Her last episode of small bowel obstruction was in July 2022 which is managed conservatively.  Patient follows at [facility].  In the ED, initial vitals with hypertension of 172/149,  mildly tachycardic at 98, afebrile, saturating well on room air.  He was noted to have a distended tender abdomen.  CBC significant for leukocytosis of 20.  CT abdomen/pelvis with small bowel obstruction.  Patient given 1 dose of Dilaudid and Zofran.  NG tube placed.  Surgery consulted and patient mid to internal medicine for further management.  Medications: No current facility-administered medications on file prior to encounter.   Current Outpatient Medications on File Prior to Encounter  Medication Sig Dispense Refill   acetaminophen (TYLENOL) 500 MG tablet Take 1,000 mg by mouth every 6 (six) hours as needed for moderate pain or headache.     BIOTIN PO Take 1 capsule by mouth daily.     Cholecalciferol (VITAMIN D3) 5000 units TABS Take 5,000 Units by mouth daily.     DHEA 25 MG CAPS Take 25 mg by mouth daily.     empagliflozin (JARDIANCE) 25 MG TABS tablet Take 25 mg by mouth daily.  3   gabapentin (NEURONTIN) 100 MG capsule Take 100 mg by mouth at bedtime.  0   JANUVIA 100 MG tablet Take 100 mg by mouth daily.  3   meclizine (ANTIVERT) 12.5 MG tablet Take 12.5 mg by mouth 3 (three) times daily as needed for dizziness.  3   Melatonin 10 MG TABS Take 10 mg by mouth at bedtime as needed for sleep.     Multiple Vitamin (MULTI-VITAMINS) TABS Take 1 tablet by mouth daily.  Omega-3 1000 MG CAPS Take 1,000 mg by mouth daily.     ondansetron (ZOFRAN-ODT) 4 MG disintegrating tablet Take 4 mg by mouth every 6 (six) hours as needed for nausea/vomiting.  0   pravastatin (PRAVACHOL) 40 MG tablet Take 40 mg by mouth at bedtime.     senna (SENOKOT) 8.6 MG tablet Take 1 tablet by mouth every other day. At bedtime     venlafaxine XR (EFFEXOR-XR) 150 MG 24 hr capsule Take 150 mg by mouth daily.  1   vitamin k 100 MCG tablet Take 100 mcg by mouth daily.     warfarin (COUMADIN) 5 MG tablet Take 5 mg by mouth every evening. Current dose  3   amoxicillin (AMOXIL) 500 MG tablet Take 2 tablets (1,000 mg  total) by mouth 3 (three) times daily. (Patient not taking: Reported on 12/12/2020) 24 tablet 0   ibuprofen (ADVIL,MOTRIN) 800 MG tablet Take 1 tablet (800 mg total) by mouth every 8 (eight) hours as needed for moderate pain. (Patient not taking: No sig reported) 21 tablet 0    Past Medical History:  Past Medical History:  Diagnosis Date   Abdominal hernia    Diabetes mellitus without complication (HCC)    Embolism (HCC)    Hyperlipemia    Ischemic colitis (HCC)    Lupus anticoagulant disorder (HCC)    Vertigo     Social:  Patient lives in [place] with her husband and mother in Social worker. Her and her husband are the main caretakers for the mother in law. She [facility] at [facility]. She is visiting [place] for her anniversary and for her grandson's 15th birthday. 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 She enjoys gardening flowers and vegetables. She is very independent in her ADL's. She denies any history of alcohol use, tobacco use or illicit drug use.    Family History: History reviewed. No pertinent family history.  Allergies: Allergies as of 12/12/2020 - Review Complete 12/12/2020  Allergen Reaction Noted   Clarithromycin Other (See Comments) 08/01/2013   Morphine and related Hives 12/24/2017   Latex Rash 08/15/2014    Review of Systems: A complete ROS was negative except as per HPI.   OBJECTIVE:  Physical Exam: Blood pressure (!) 149/60, pulse 100, temperature 97.6 F (36.4 C), temperature source Oral, resp. rate 17, height 4\' 10"  (1.473 m), weight 75.8 kg, SpO2 99 %. Physical Exam  Constitutional: Appears well-developed and well-nourished. No acute distress.  HENT: Normocephalic and atraumatic, EOMI, conjunctiva normal, moist mucous membranes Cardiovascular: Normal rate, regular rhythm, S1 and S2 present, no murmurs, rubs, gallops.  Distal pulses intact Respiratory: No respiratory distress, no accessory muscle  use.Lungs are clear to auscultation bilaterally. GI: mild distension but soft; mild tenderness in periumbilical and epigastric region. Hypoactive bowel sounds. Reducible inferior incisional hernia  Musculoskeletal: Normal bulk and tone.  No peripheral edema noted. Neurological: Is alert and oriented x4, no apparent focal deficits noted. Skin: Warm and dry.  No rash, erythema, lesions noted. Psychiatric: Normal mood and affect. Behavior is normal. Judgment and thought content normal.   Pertinent Labs: CBC    Component Value Date/Time   WBC 20.2 (H) 12/12/2020 0445   RBC 5.67 (H) 12/12/2020 0445   HGB 14.9 12/12/2020 0445   HCT 47.0 (H) 12/12/2020 0445   PLT 440 (H) 12/12/2020 0445   MCV 82.9 12/12/2020 0445   MCH 26.3 12/12/2020 0445   MCHC 31.7 12/12/2020 0445  RDW 16.0 (H) 12/12/2020 0445   LYMPHSABS 2.1 12/12/2020 0445   MONOABS 0.8 12/12/2020 0445   EOSABS 0.1 12/12/2020 0445   BASOSABS 0.1 12/12/2020 0445     CMP     Component Value Date/Time   NA 137 12/12/2020 0445   K 4.2 12/12/2020 0445   CL 96 (L) 12/12/2020 0445   CO2 26 12/12/2020 0445   GLUCOSE 249 (H) 12/12/2020 0445   BUN 11 12/12/2020 0445   CREATININE 0.82 12/12/2020 0445   CALCIUM 10.8 (H) 12/12/2020 0445   PROT 8.2 (H) 12/12/2020 0445   ALBUMIN 4.1 12/12/2020 0445   AST 21 12/12/2020 0445   ALT 17 12/12/2020 0445   ALKPHOS 103 12/12/2020 0445   BILITOT 0.6 12/12/2020 0445   GFRNONAA >60 12/12/2020 0445   GFRAA >60 12/27/2017 0418    Pertinent Imaging: CT Angio Abd/Pel W and/or Wo Contrast  Result Date: 12/12/2020 CLINICAL DATA:  Abdominal pain, question acute mesenteric ischemia EXAM: CTA ABDOMEN AND PELVIS WITHOUT AND WITH CONTRAST TECHNIQUE: Multidetector CT imaging of the abdomen and pelvis was performed using the standard protocol during bolus administration of intravenous contrast. Multiplanar reconstructed images and MIPs were obtained and reviewed to evaluate the vascular anatomy. CONTRAST:   75mL OMNIPAQUE IOHEXOL 350 MG/ML SOLN COMPARISON:  None. FINDINGS: VASCULAR Aorta: Scattered atheromatous plaque. No dissection, aneurysm, or wall thickening Celiac: Plaque at the ostium without flow reducing stenosis. Major branches are patent SMA: Plaque at the ostium without flow reducing stenosis. No branch occlusion, beading, or aneurysm. Renals: Atheromatous calcification at the left ostium. No stenosis, beading, or dissection. IMA: Patent Inflow: Minimal atheromatous changes. Proximal Outflow: Unremarkable Veins: Normal Review of the MIP images confirms the above findings. NON-VASCULAR Lower chest:  No contributory findings. Hepatobiliary: Borderline hepatic steatosis.No evidence of biliary obstruction or stone. Pancreas: Unremarkable. Spleen: Unremarkable. Adrenals/Urinary Tract: Negative adrenals. No hydronephrosis or stone. Unremarkable bladder. Stomach/Bowel: Dilated small bowel loops with mesenteric edema and fluid filling with transition point in the ventral abdomen beneath a laparotomy scar where there is a small, small bowel containing infraumbilical hernia and bowel loop distortion suggesting adhesions. No bowel ischemia noted. Subtotal colectomy. Lymphatic: Negative for adenopathy or mass. Reproductive:No pathologic findings. Other: No ascites or pneumoperitoneum. Musculoskeletal: No acute abnormalities. IMPRESSION: 1. Small bowel obstruction likely due to adhesive disease just below the patient's laparotomy scar. 2. No emergent vascular finding. There is atherosclerosis without flow limiting stenosis of major vessels. 3. Infraumbilical midline hernia containing small bowel, nonobstructive. Electronically Signed   By: Marnee SpringJonathon  Watts M.D.   On: 12/12/2020 06:53    EKG: personally reviewed my interpretation is normal sinus rhythm, right axis deviation with ?left posterior fascicular block; rate 95, QTc 492  ASSESSMENT & PLAN:  Assessment: Active Problems:   SBO (small bowel obstruction)  (HCC)   Zenovia JordanSofia Winterhalter is a 70 y.o. with pertinent PMH of antiphospholipid syndrome on warfarin, diabetes mellitus, hyperlipidemia and depression with prior history of ischemic colitis s/p colostomy and reversal who presented with abdominal pain and admit for small bowel obstruction on hospital day 0  Plan: #Small bowel obstruction Patient presented with abdominal pain, nausea/vomiting in setting of prior abdominal surgeries.  She was found to have a bowel obstruction on CT abdomen/pelvis.  She does have a history of ischemic colitis s/p colostomy and reversal.  She did have a mild leukocytosis on CBC.  Given her history of antiphospholipid syndrome, ischemic colitis was on differential.  However, INR within therapeutic range and lactic acid within normal.  This makes ischemic line is less likely.  CT abdomen pelvis was supportive of diagnosis of small bowel obstruction.  Patient had NG tube placed with improvement.  -Surgery consulted, appreciate recommendations -NG tube placed, continuous suction -LR 100 cc/h for 24 hours -Maintain n.p.o. status   #Antiphospholipid syndrome on warfarin Patient has a history of lupus anticoagulant with a programmable state on warfarin therapy.  She is [facility] for her care.  INR 3.0 at this time.  Recommended range 2.0-3.0.  Given n.p.o. status and current INR of 2.0, will hold warfarin.  -Can start IV heparin if INR less than 2.0 and patient is still unable to tolerate p.o. intake.  #Type 2 diabetes mellitus Patient is on Jardiance 25 mg daily and Januvia 100 mg daily.  Blood glucose 249 on arrival. -SSI moderate -CBG monitoring  #Hyperlipidemia Patient is on pravastatin at baseline.  Given her n.p.o. status, holding statin at this time.  #Depression Patient is on venlafaxine for history of depression.  Holding in setting of n.p.o. status.   Best Practice: Diet: N.p.o. IVF: Fluids: LR, Rate:  100 cc/hr x 24 hrs VTE:    Heparin Code: Full Status: Inpatient with expected length of stay greater than 2 midnights. Anticipated Discharge Location: Home Barriers to Discharge: Medical stability  Signature: Eliezer Bottom, MD Internal Medicine Resident, PGY-3 [facility]  Pager: [phone] 9:04 AM, 12/12/2020   Please contact the on call pager after 5 pm and on weekends at [phone].

## 2020-12-12 NOTE — ED Provider Notes (Signed)
Care handoff received from [facility] up still PA-C at shift change please see previous provider note for full details of visit.  In short 70 year old female presented for abdominal pain nausea and vomiting that began yesterday around 6:30 PM.  Patient has a history of previous bowel obstructions secondary to multiple abdominal surgeries including colostomy and takedown.  She also has a history of antiphospholipid syndrome, diabetes, hyperlipidemia, ischemic colitis.  Labs last night were significant for leukocytosis of 20.2.  Lactic acid was within normal limits.  No emergent electrolyte derangement, AKI, LFT elevations or gap.  Lipase was within normal limits.  At time of shift change CT angio abdomen pelvis was pending I been asked to follow-up on that result and then admit the patient. Physical Exam  BP (!) 169/82   Pulse 98   Temp 97.6 F (36.4 C) (Oral)   Resp (!) 26   Ht 4\' 10"  (1.473 m)   Wt 75.8 kg   SpO2 100%   BMI 34.93 kg/m   Physical Exam Constitutional:      General: She is not in acute distress.    Appearance: Normal appearance. She is well-developed. She is not ill-appearing or diaphoretic.  HENT:     Head: Normocephalic and atraumatic.  Eyes:     General: Vision grossly intact. Gaze aligned appropriately.     Pupils: Pupils are equal, round, and reactive to light.  Neck:     Trachea: Trachea and phonation normal.  Pulmonary:     Effort: Pulmonary effort is normal. No respiratory distress.  Abdominal:     Palpations: Abdomen is soft.     Tenderness: There is generalized abdominal tenderness. There is no guarding or rebound.  Musculoskeletal:        General: Normal range of motion.     Cervical back: Normal range of motion.  Skin:    General: Skin is warm and dry.  Neurological:     Mental Status: She is alert.     GCS: GCS eye subscore is 4. GCS verbal subscore is 5. GCS motor subscore is 6.     Comments: Speech is clear and goal oriented, follows commands Major  Cranial nerves without deficit, no facial droop Moves extremities without ataxia, coordination intact  Psychiatric:        Behavior: Behavior normal.    ED Course/Procedures   Clinical Course as of 12/12/20 0906  Thu Dec 12, 2020  0741 Medicine [BM]    Clinical Course User Index [BM] Dec 14, 2020, PA-C    Procedures  MDM  CT Angio AP:  IMPRESSION:  1. Small bowel obstruction likely due to adhesive disease just below  the patient's laparotomy scar.  2. No emergent vascular finding. There is atherosclerosis without  flow limiting stenosis of major vessels.  3. Infraumbilical midline hernia containing small bowel,  nonobstructive.   7 AM: Patient reassessed she is resting comfortably bed no acute distress pain improved following Dilaudid.  Vital signs are stable.  Patient updated on findings above and she stated understanding.  Nasogastric tube ordered.  Consult called for admission. ==== 7:18 AM: Consult with general surgery.  Spoke with Bill Salinas who will be by to evaluate the patient advises medicine admission. ==== 7:41 AM: Consult with medicine team.  Patient was accepted for admission. ==== Patient reevaluated resting company bed NG tube placed by nursing staff.  Patient reports that his comfortable.  No further concerns at this time per patient.    Note: Portions of this report  may have been transcribed using voice recognition software. Every effort was made to ensure accuracy; however, inadvertent computerized transcription errors may still be present.        Elizabeth Palau 12/12/20 0786    Linwood Dibbles, MD 12/13/20 [phone]

## 2020-12-12 NOTE — ED Notes (Signed)
Patient transported to CT 

## 2020-12-12 NOTE — Consult Note (Signed)
[name] [birth year]  [id number].    Requesting MD: Harlene Salts, PA-C Chief Complaint/Reason for Consult: Small bowel obstruction  HPI:  This is a 70 year old female who is originally from [place] and is followed by [facility] for her medical problems.  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  In 2015 she was noted to have ischemic colitis and required emergent colectomy with creation of right sided colostomy.  This was later reversed.  She has developed multiple abdominal incisional hernias.  She has undergone 2-3 further operations for hernia repair with mesh placement.  She has also had a laparoscopic Nissen fundoplication many years prior.  According to care everywhere she was just admitted in late July with a bowel obstruction that resolved conservatively.  She states she has a history of multiple small bowel obstructions requiring admission.  She is currently here in [place] visiting her son and grandson for the his birthday.  She began developing centralized abdominal pain last night around 1830.  She last ate around 10 AM yesterday morning.  She has continued to pass flatus.  Her last bowel movement was yesterday.  She has developed nausea and dry heaves but no actual emesis.  She is familiar with this pattern and therefore presented to the emergency department for evaluation.  Upon arrival here, she had a CT scan that revealed a small bowel obstruction in the central abdomen.  We have been asked to see her for further evaluation.  ROS: ROS: Please see HPI, otherwise all other systems have been reviewed and are negative.  History reviewed. No pertinent family history.  Past Medical History:  Diagnosis Date   Abdominal hernia    Diabetes mellitus without complication (HCC)    Embolism (HCC)    Hyperlipemia    Ischemic colitis (HCC)    Lupus anticoagulant  disorder (HCC)    Vertigo     Past Surgical History:  Procedure Laterality Date   COLECTOMY WITH COLOSTOMY CREATION/HARTMANN PROCEDURE     not Hartmann's. right-sided colostom   COLOSTOMY TAKEDOWN     HERNIA REPAIR     x3   NISSEN FUNDOPLICATION      Social History:  reports that she has never smoked. She has never used smokeless tobacco. No history on file for alcohol use and drug use.  Allergies:  Allergies  Allergen Reactions   Clarithromycin Other (See Comments)    unsure   Morphine And Related Hives   Latex Rash    (Not in a hospital admission)    Physical Exam: Blood pressure (!) 153/64, pulse 94, temperature 97.6 F (36.4 C), temperature source Oral, resp. rate 10, height 4\' 10"  (1.473 m), weight 75.8 kg, SpO2 96 %. General: pleasant, WD, WN female who is laying in bed in NAD HEENT: head is normocephalic, atraumatic.  Sclera are noninjected.  PERRL.  Ears and nose without any masses or lesions.  Mouth is pink and moist Heart: regular, rate, and rhythm.  Normal s1,s2. No obvious murmurs, gallops, or rubs noted.  Palpable radial and pedal pulses bilaterally Lungs: CTAB, no wheezes, rhonchi, or rales noted.  Respiratory effort nonlabored Abd: soft, but with some distention and tympany.  Mildly tender in her central abdomen.  Hypoactive BS, no masses or organomegaly.  She does have a soft reducible inferior incisional hernia. MS: all 4 extremities are symmetrical with no cyanosis, clubbing, or edema. Skin: warm and dry with  no masses, lesions, or rashes Neuro: Cranial nerves 2-12 grossly intact, sensation is normal throughout Psych: A&Ox3 with an appropriate affect.   Results for orders placed or performed during the hospital encounter of 12/12/20 (from the past 48 hour(s))  Lactic acid, plasma     Status: None   Collection Time: 12/12/20  4:45 AM  Result Value Ref Range   Lactic Acid, Venous 1.9 0.5 - 1.9 mmol/L    Comment: Performed at [facility],  [address], [place], [place] [address]  CBC with Differential     Status: Abnormal   Collection Time: 12/12/20  4:45 AM  Result Value Ref Range   WBC 20.2 (H) 4.0 - 10.5 K/uL   RBC 5.67 (H) 3.87 - 5.11 MIL/uL   Hemoglobin 14.9 12.0 - 15.0 g/dL   HCT 19.1 (H) 47.8 - 29.5 %   MCV 82.9 80.0 - 100.0 fL   MCH 26.3 26.0 - 34.0 pg   MCHC 31.7 30.0 - 36.0 g/dL   RDW 62.1 (H) 30.8 - 65.7 %   Platelets 440 (H) 150 - 400 K/uL   nRBC 0.0 0.0 - 0.2 %   Neutrophils Relative % 85 %   Neutro Abs 17.0 (H) 1.7 - 7.7 K/uL   Lymphocytes Relative 11 %   Lymphs Abs 2.1 0.7 - 4.0 K/uL   Monocytes Relative 4 %   Monocytes Absolute 0.8 0.1 - 1.0 K/uL   Eosinophils Relative 0 %   Eosinophils Absolute 0.1 0.0 - 0.5 K/uL   Basophils Relative 0 %   Basophils Absolute 0.1 0.0 - 0.1 K/uL   Immature Granulocytes 0 %   Abs Immature Granulocytes 0.09 (H) 0.00 - 0.07 K/uL    Comment: Performed at [facility], [address], [place], [place] [address]  Comprehensive metabolic panel     Status: Abnormal   Collection Time: 12/12/20  4:45 AM  Result Value Ref Range   Sodium 137 135 - 145 mmol/L   Potassium 4.2 3.5 - 5.1 mmol/L   Chloride 96 (L) 98 - 111 mmol/L   CO2 26 22 - 32 mmol/L   Glucose, Bld 249 (H) 70 - 99 mg/dL    Comment: Glucose reference range applies only to samples taken after fasting for at least 8 hours.   BUN 11 8 - 23 mg/dL   Creatinine, Ser 2.95 0.44 - 1.00 mg/dL   Calcium 28.4 (H) 8.9 - 10.3 mg/dL   Total Protein 8.2 (H) 6.5 - 8.1 g/dL   Albumin 4.1 3.5 - 5.0 g/dL   AST 21 15 - 41 U/L   ALT 17 0 - 44 U/L   Alkaline Phosphatase 103 38 - 126 U/L   Total Bilirubin 0.6 0.3 - 1.2 mg/dL   GFR, Estimated >13 >24 mL/min    Comment: (NOTE) Calculated using the CKD-EPI Creatinine Equation (2021)    Anion gap 15 5 - 15    Comment: Performed at [facility], [address], [place], [place] [address]  Lipase, blood     Status: None   Collection Time: 12/12/20  4:45 AM  Result Value  Ref Range   Lipase 28 11 - 51 U/L    Comment: Performed at [facility], [address] [place], [place] [address]  Protime-INR     Status: Abnormal   Collection Time: 12/12/20  7:00 AM  Result Value Ref Range   Prothrombin Time 31.1 (H) 11.4 - 15.2 seconds   INR  3.0 (H) 0.8 - 1.2    Comment: (NOTE) INR goal varies based on device and disease states. Performed at [facility], [address], [place], [place] [address]   APTT     Status: Abnormal   Collection Time: 12/12/20  7:00 AM  Result Value Ref Range   aPTT 50 (H) 24 - 36 seconds    Comment:        IF BASELINE aPTT IS ELEVATED, SUGGEST PATIENT RISK ASSESSMENT BE USED TO DETERMINE APPROPRIATE ANTICOAGULANT THERAPY. Performed at [facility], [address], [place], [place] [address]    CT Angio Abd/Pel W and/or Wo Contrast  Result Date: 12/12/2020 CLINICAL DATA:  Abdominal pain, question acute mesenteric ischemia EXAM: CTA ABDOMEN AND PELVIS WITHOUT AND WITH CONTRAST TECHNIQUE: Multidetector CT imaging of the abdomen and pelvis was performed using the standard protocol during bolus administration of intravenous contrast. Multiplanar reconstructed images and MIPs were obtained and reviewed to evaluate the vascular anatomy. CONTRAST:  75mL OMNIPAQUE IOHEXOL 350 MG/ML SOLN COMPARISON:  None. FINDINGS: VASCULAR Aorta: Scattered atheromatous plaque. No dissection, aneurysm, or wall thickening Celiac: Plaque at the ostium without flow reducing stenosis. Major branches are patent SMA: Plaque at the ostium without flow reducing stenosis. No branch occlusion, beading, or aneurysm. Renals: Atheromatous calcification at the left ostium. No stenosis, beading, or dissection. IMA: Patent Inflow: Minimal atheromatous changes. Proximal Outflow: Unremarkable Veins: Normal Review of the MIP images confirms the above findings. NON-VASCULAR Lower chest:  No contributory findings. Hepatobiliary: Borderline hepatic steatosis.No evidence  of biliary obstruction or stone. Pancreas: Unremarkable. Spleen: Unremarkable. Adrenals/Urinary Tract: Negative adrenals. No hydronephrosis or stone. Unremarkable bladder. Stomach/Bowel: Dilated small bowel loops with mesenteric edema and fluid filling with transition point in the ventral abdomen beneath a laparotomy scar where there is a small, small bowel containing infraumbilical hernia and bowel loop distortion suggesting adhesions. No bowel ischemia noted. Subtotal colectomy. Lymphatic: Negative for adenopathy or mass. Reproductive:No pathologic findings. Other: No ascites or pneumoperitoneum. Musculoskeletal: No acute abnormalities. IMPRESSION: 1. Small bowel obstruction likely due to adhesive disease just below the patient's laparotomy scar. 2. No emergent vascular finding. There is atherosclerosis without flow limiting stenosis of major vessels. 3. Infraumbilical midline hernia containing small bowel, nonobstructive. Electronically Signed   By: Marnee Spring M.D.   On: 12/12/2020 06:53      Assessment/Plan Recurrent small bowel obstruction The patient's care everywhere chart as well as her current imaging and laboratory data have been reviewed.  The patient appears to have a recurrent small bowel obstruction.  Given her past surgical history and that she has previously been told that she has significant scar tissue, we would like to proceed with conservative management.  She will have a nasogastric tube placed and be started on the small bowel obstruction protocol.  The inferior incisional hernia is soft and reducible and does not appear to be the etiology of her obstruction.  The patient is agreeable to this plan.  We do recommend holding her Coumadin.  She can be bridged with Lovenox or heparin per primary's choice.  We will continue to follow the patient.  Hopefully we can avoid any type of surgical intervention.   FEN -NPO/NG tube/IVF per medicine VTE -hold Coumadin, may bridge with heparin  or Lovenox when INR less than 2/SCDs ID -none currently needed.  Follow WBC.  This is 20,000, but may be related to dehydration as there is no evidence of infection or compromised bowel on her CT scan.  DM HLD Lupus anticoagulant disorder -hold Coumadin, see above Depression  Letha CapeKelly E Nicklaus Alviar, [facility] 12/12/2020, 8:14 AM Please see Amion for pager number during day hours 7:00am-4:30pm or 7:00am -11:30am on weekends

## 2020-12-13 ENCOUNTER — Telehealth: Admit: 2020-12-13 | Payer: PRIVATE HEALTH INSURANCE | Attending: Medical Oncology | Primary: Internal Medicine

## 2020-12-13 ENCOUNTER — Other Ambulatory Visit (HOSPITAL_COMMUNITY): Payer: Self-pay

## 2020-12-13 ENCOUNTER — Inpatient Hospital Stay (HOSPITAL_COMMUNITY): Payer: Medicare Other

## 2020-12-13 DIAGNOSIS — Z8719 Personal history of other diseases of the digestive system: Secondary | ICD-10-CM

## 2020-12-13 DIAGNOSIS — K56609 Unspecified intestinal obstruction, unspecified as to partial versus complete obstruction: Secondary | ICD-10-CM

## 2020-12-13 DIAGNOSIS — R1033 Periumbilical pain: Secondary | ICD-10-CM

## 2020-12-13 LAB — GLUCOSE, CAPILLARY
Glucose-Capillary: 109 mg/dL — ABNORMAL HIGH (ref 70–99)
Glucose-Capillary: 137 mg/dL — ABNORMAL HIGH (ref 70–99)
Glucose-Capillary: 166 mg/dL — ABNORMAL HIGH (ref 70–99)
Glucose-Capillary: 119 mg/dL — ABNORMAL HIGH (ref 70–99)

## 2020-12-13 LAB — COMPREHENSIVE METABOLIC PANEL
ALT: 14 U/L (ref 0–44)
AST: 16 U/L (ref 15–41)
Albumin: 3.3 g/dL — ABNORMAL LOW (ref 3.5–5.0)
Alkaline Phosphatase: 80 U/L (ref 38–126)
Anion gap: 9 (ref 5–15)
BUN: 10 mg/dL (ref 8–23)
CO2: 27 mmol/L (ref 22–32)
Calcium: 8.7 mg/dL — ABNORMAL LOW (ref 8.9–10.3)
Chloride: 101 mmol/L (ref 98–111)
Creatinine, Ser: 0.74 mg/dL (ref 0.44–1.00)
GFR, Estimated: >60 mL/min (ref >60)
Glucose, Bld: 103 mg/dL — ABNORMAL HIGH (ref 70–99)
Potassium: 3.7 mmol/L (ref 3.5–5.1)
Sodium: 137 mmol/L (ref 135–145)
Total Bilirubin: 1 mg/dL (ref 0.3–1.2)
Total Protein: 6.7 g/dL (ref 6.5–8.1)

## 2020-12-13 LAB — CBC
HCT: 39.8 % (ref 36.0–46.0)
Hemoglobin: 12.6 g/dL (ref 12.0–15.0)
MCH: 26.5 pg (ref 26.0–34.0)
MCHC: 31.7 g/dL (ref 30.0–36.0)
MCV: 83.8 fL (ref 80.0–100.0)
Platelets: 291 10*3/uL (ref 150–400)
RBC: 4.75 MIL/uL (ref 3.87–5.11)
RDW: 15.8 % — ABNORMAL HIGH (ref 11.5–15.5)
WBC: 9.2 10*3/uL (ref 4.0–10.5)
nRBC: 0 % (ref 0.0–0.2)

## 2020-12-13 LAB — PROTIME-INR
INR: 2.6 — ABNORMAL HIGH (ref 0.8–1.2)
Prothrombin Time: 27.8 seconds — ABNORMAL HIGH (ref 11.4–15.2)

## 2020-12-13 LAB — APTT: aPTT: 49 seconds — ABNORMAL HIGH (ref 24–36)

## 2020-12-13 LAB — HIV ANTIBODY (ROUTINE TESTING W REFLEX): HIV Screen 4th Generation wRfx: NONREACTIVE

## 2020-12-13 MED ORDER — HYDROMORPHONE HCL 2 MG PO TABS
2.0000 mg | ORAL_TABLET | Freq: Two times a day (BID) | ORAL | 0 refills | Status: AC | PRN
  Filled 2020-12-13: qty 10, 5d supply, fill #0

## 2020-12-13 NOTE — Telephone Encounter
RC to Aldie. LM requesting RC to discuss.

## 2020-12-13 NOTE — Telephone Encounter
rec'd call from patient stating that she is in hospital in [place]. Patient stated that she is getting discharged today and would like a cal back as soon as possible pls. [phone]

## 2020-12-13 NOTE — Discharge Instructions (Addendum)
Terri Morales you were admitted for small bowel obstruction. You were treated conservatively with a nasogastric tube and IV fluids. Your obstruction was resolved and you tolerated a diet, continued to have gas and bowel movements, and only mild pain. You should continue a fluids diet until returning home to [place]. Please schedule and appointment with your PCP and consider a more long term treatment for your small bowel obstruction. You should schedule it within the next week.

## 2020-12-13 NOTE — Progress Notes (Signed)
Discharge instructions read and given to patient at this time and states understanding. Confirmed that pharmacy delivered her pain medication to her already. Waiting on her husband to pick her up. Denies any pain or discomfort at this time. Ambulating in room with no difficulty. Tolerated full liquid prior to discharge.

## 2020-12-13 NOTE — Hospital Course (Addendum)
Terri Morales is a 70 y.o. 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    SBO Patient with hx of prior abdominal surgeries  and SBO presenting with abdominal pain and N/V. Found to have a bowel obstruction on CT. Pt has a hx of ischemic colitis s/p colostomy and reversal. CBC showed a mild leukocytosis that has resolved today. Patient tolerated NG tube overnight, with symptomatic improvement. She had flatus and BM. Benign physical exam findings. XR shows resolution of SBO, and NG tube removed, advanced to clear liquid diet. Pt tolerated diet, continues to have flatus and BM, and pain is reasonable. Advised to continue clear liquid diet until returning home to [place].     Antiphospholipid syndrome on warfarin Hx of lupus anticoagulant with a programmable state on warfarin therapy.  She is followed at [facility] for her care. INR 3.0 at this time,  recommended range 2.0-3.0. Held warfarin during NPO state.    T2DM Patient is on Jardiance 25 mg daily and Januvia 100 mg daily. Blood glucose 249 on arrival.   -SSI moderate -CBG monitoring   HLD Pt is on pravastatin at baseline.  -held due to NPO status MDD Pt on venlafaxine for hx of depression.  -held due to NPO status

## 2020-12-13 NOTE — Discharge Summary (Signed)
Name: Terri Morales MRN: [id number] DOB: 1950/12/29 70 y.o. PCP: Patient, No Pcp Per (Inactive)  Date of Admission: 12/12/2020  4:30 AM Date of Discharge:  12/13/20 Attending Physician: Dr. Criselda Peaches  DISCHARGE DIAGNOSIS:  Primary Problem: <principal problem not specified>   Hospital Problems: Active Problems:   SBO (small bowel obstruction) (HCC)    DISCHARGE MEDICATIONS:   Allergies as of 12/13/2020       Reactions   Clarithromycin Other (See Comments)   unsure   Morphine And Related Hives   Latex Rash        Medication List     STOP taking these medications    amoxicillin 500 MG tablet Commonly known as: AMOXIL   ibuprofen 800 MG tablet Commonly known as: ADVIL       TAKE these medications    acetaminophen 500 MG tablet Commonly known as: TYLENOL Take 1,000 mg by mouth every 6 (six) hours as needed for moderate pain or headache.   BIOTIN PO Take 1 capsule by mouth daily.   DHEA 25 MG Caps Take 25 mg by mouth daily.   empagliflozin 25 MG Tabs tablet Commonly known as: JARDIANCE Take 25 mg by mouth daily.   gabapentin 100 MG capsule Commonly known as: NEURONTIN Take 100 mg by mouth at bedtime.   HYDROmorphone 2 MG tablet Commonly known as: Dilaudid Take 1 tablet (2 mg total) by mouth every 12 (twelve) hours as needed for up to 5 days for severe pain.   Januvia 100 MG tablet Generic drug: sitaGLIPtin Take 100 mg by mouth daily.   meclizine 12.5 MG tablet Commonly known as: ANTIVERT Take 12.5 mg by mouth 3 (three) times daily as needed for dizziness.   Melatonin 10 MG Tabs Take 10 mg by mouth at bedtime as needed for sleep.   Multi-Vitamins Tabs Take 1 tablet by mouth daily.   Omega-3 1000 MG Caps Take 1,000 mg by mouth daily.   ondansetron 4 MG disintegrating tablet Commonly known as: ZOFRAN-ODT Take 4 mg by mouth every 6 (six) hours as needed for nausea/vomiting.   pravastatin 40 MG tablet Commonly known as: PRAVACHOL Take 40 mg  by mouth at bedtime.   senna 8.6 MG tablet Commonly known as: SENOKOT Take 1 tablet by mouth every other day. At bedtime   venlafaxine XR 150 MG 24 hr capsule Commonly known as: EFFEXOR-XR Take 150 mg by mouth daily.   Vitamin D3 125 MCG (5000 UT) Tabs Take 5,000 Units by mouth daily.   vitamin k 100 MCG tablet Take 100 mcg by mouth daily.   warfarin 5 MG tablet Commonly known as: COUMADIN Take 5 mg by mouth every evening. Current dose        DISPOSITION AND FOLLOW-UP:  [name] was discharged from [facility] in Stable condition. At the hospital follow up visit please address:  Follow-up Recommendations: Consults: none Labs: none  Studies: none  Medications: none   Follow-up Appointments: none    HOSPITAL COURSE:  Patient Summary: Terri Morales is a 70 y.o. 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    SBO Patient with hx of prior abdominal surgeries  and SBO presenting with abdominal pain and N/V. Found to have a bowel obstruction on CT. Pt has a hx of ischemic colitis s/p colostomy and reversal. CBC showed a mild leukocytosis that has  resolved today. Patient tolerated NG tube overnight, with symptomatic improvement. She had flatus and BM. Benign physical exam findings. XR shows resolution of SBO, and NG tube removed, advanced to clear liquid diet. Pt tolerated diet, continues to have flatus and BM, and pain is reasonable. Advised to continue clear liquid diet until returning home to [place].     Antiphospholipid syndrome on warfarin Hx of lupus anticoagulant with a programmable state on warfarin therapy.  She is followed at [facility] for her care. INR 3.0 at this time,  recommended range 2.0-3.0. Held warfarin during NPO state.    T2DM Patient is on Jardiance 25 mg  daily and Januvia 100 mg daily. Blood glucose 249 on arrival.   -SSI moderate -CBG monitoring   HLD Pt is on pravastatin at baseline.  -held due to NPO status MDD Pt on venlafaxine for hx of depression.  -held due to NPO status   DISCHARGE INSTRUCTIONS:   Discharge Instructions     Call MD for:  difficulty breathing, headache or visual disturbances   Complete by: As directed    Call MD for:  extreme fatigue   Complete by: As directed    Call MD for:  hives   Complete by: As directed    Call MD for:  persistant dizziness or light-headedness   Complete by: As directed    Call MD for:  persistant nausea and vomiting   Complete by: As directed    Call MD for:  redness, tenderness, or signs of infection (pain, swelling, redness, odor or green/yellow discharge around incision site)   Complete by: As directed    Call MD for:  severe uncontrolled pain   Complete by: As directed    Call MD for:  temperature >100.4   Complete by: As directed    Diet full liquid   Complete by: As directed    Increase activity slowly   Complete by: As directed        SUBJECTIVE:  No acute overnight events. Patient was seen at bedside during rounds this morning. Pt reports feeling much better this AM. She has been passing flatus and has been having diarrhea. Pt still having some mild stomach pain around where her hernia is. Denies nausea or vomiting. She is waiting for breakfast this morning. No other complains or concerns at this time.    She has dealt with obstructions since she had ischemic colitis. She is aware that she needs to continue a liquid diet until she returns to [place].   Discharge Vitals:   BP (!) 136/53   Pulse 63   Temp 98 F (36.7 C) (Oral)   Resp 12   Ht 4\' 10"  (1.473 m)   Wt 75.8 kg   SpO2 98%   BMI 34.93 kg/m   OBJECTIVE:  Constitutional: alert, well-appearing, in no acute distress HENT: normocephalic, atraumatic, mucous membranes moist Eyes: conjunctiva  non-erythematous, extraocular movements intact Cardiovascular: regular rate and rhythm, no m/r/g Pulmonary/Chest: normal work of breathing on room air, lungs clear to auscultation bilaterally Abdomen: mild distension but soft; mild tenderness in periumbilical and epigastric region. Hypoactive bowel sounds. Reducible inferior incisional hernia  Neurological: alert & oriented x 3  Skin: warm and dry Psych: normal behavior, normal affect  Pertinent Labs, Studies, and Procedures:  CBC Latest Ref Rng & Units 12/13/2020 12/12/2020 12/27/2017  WBC 4.0 - 10.5 K/uL 9.2 20.2(H) 11.0(H)  Hemoglobin 12.0 - 15.0 g/dL 02/26/2018 62.6 11.3(L)  Hematocrit 36.0 - 46.0 % 39.8  47.0(H) 34.9(L)  Platelets 150 - 400 K/uL 291 440(H) 250    CMP Latest Ref Rng & Units 12/13/2020 12/12/2020 12/27/2017  Glucose 70 - 99 mg/dL 161(W103(H) 960(A249(H) 76  BUN 8 - 23 mg/dL 10 11 9   Creatinine 0.44 - 1.00 mg/dL 5.400.74 9.810.82 1.910.73  Sodium 135 - 145 mmol/L 137 137 139  Potassium 3.5 - 5.1 mmol/L 3.7 4.2 3.7  Chloride 98 - 111 mmol/L 101 96(L) 109  CO2 22 - 32 mmol/L 27 26 23   Calcium 8.9 - 10.3 mg/dL 4.7(W8.7(L) 10.8(H) 8.3(L)  Total Protein 6.5 - 8.1 g/dL 6.7 2.9(F8.2(H) 5.6(L)  Total Bilirubin 0.3 - 1.2 mg/dL 1.0 0.6 0.7  Alkaline Phos 38 - 126 U/L 80 103 302(H)  AST 15 - 41 U/L 16 21 80(H)  ALT 0 - 44 U/L 14 17 57(H)    DG Abd Portable 1V-Small Bowel Obstruction Protocol-initial, 8 hr delay  Result Date: 12/13/2020 CLINICAL DATA:  8 hour small-bowel obstruction follow-up 3 study EXAM: PORTABLE ABDOMEN - 1 VIEW COMPARISON:  CT and plain film from earlier in the same day. FINDINGS: Gastric catheter is again seen in the stomach. Previously administered contrast now lies within the colon. Previously seen dilated loops of small bowel are less prevalent. No free air is noted. IMPRESSION: Contrast material now lies within the colon with decreased small bowel dilatation. Electronically Signed   By: Alcide CleverMark  Lukens M.D.   On: 12/13/2020 01:02   DG Abd Portable  1V-Small Bowel Protocol-Position Verification  Result Date: 12/12/2020 CLINICAL DATA:  NG tube placement EXAM: PORTABLE ABDOMEN - 1 VIEW COMPARISON:  Abdominal radiographs obtained earlier the same day FINDINGS: The enteric catheter is coiled in the stomach. The tip and side hole project over the stomach. There is a nonobstructive bowel gas pattern. There is no gross organomegaly or abnormal soft tissue calcification. The lung bases are clear. The bones are unremarkable. IMPRESSION: NG tube tip and side hole project over the stomach. Electronically Signed   By: Lesia HausenPeter  Noone M.D.   On: 12/12/2020 10:56   DG Abd Portable 1 View  Result Date: 12/12/2020 CLINICAL DATA:  NG tube placement EXAM: PORTABLE ABDOMEN - 1 VIEW COMPARISON: CTA of earlier today FINDINGS: Nasogastric tube is looped in and terminates in the proximal stomach. No gross free intraperitoneal air. IMPRESSION: Nasogastric tube terminating in the proximal stomach. Consider advancement. Electronically Signed   By: Jeronimo GreavesKyle  Talbot M.D.   On: 12/12/2020 08:46   CT Angio Abd/Pel W and/or Wo Contrast  Result Date: 12/12/2020 CLINICAL DATA:  Abdominal pain, question acute mesenteric ischemia EXAM: CTA ABDOMEN AND PELVIS WITHOUT AND WITH CONTRAST TECHNIQUE: Multidetector CT imaging of the abdomen and pelvis was performed using the standard protocol during bolus administration of intravenous contrast. Multiplanar reconstructed images and MIPs were obtained and reviewed to evaluate the vascular anatomy. CONTRAST:  75mL OMNIPAQUE IOHEXOL 350 MG/ML SOLN COMPARISON:  None. FINDINGS: VASCULAR Aorta: Scattered atheromatous plaque. No dissection, aneurysm, or wall thickening Celiac: Plaque at the ostium without flow reducing stenosis. Major branches are patent SMA: Plaque at the ostium without flow reducing stenosis. No branch occlusion, beading, or aneurysm. Renals: Atheromatous calcification at the left ostium. No stenosis, beading, or dissection. IMA: Patent  Inflow: Minimal atheromatous changes. Proximal Outflow: Unremarkable Veins: Normal Review of the MIP images confirms the above findings. NON-VASCULAR Lower chest:  No contributory findings. Hepatobiliary: Borderline hepatic steatosis.No evidence of biliary obstruction or stone. Pancreas: Unremarkable. Spleen: Unremarkable. Adrenals/Urinary Tract: Negative adrenals. No hydronephrosis or stone. Unremarkable  bladder. Stomach/Bowel: Dilated small bowel loops with mesenteric edema and fluid filling with transition point in the ventral abdomen beneath a laparotomy scar where there is a small, small bowel containing infraumbilical hernia and bowel loop distortion suggesting adhesions. No bowel ischemia noted. Subtotal colectomy. Lymphatic: Negative for adenopathy or mass. Reproductive:No pathologic findings. Other: No ascites or pneumoperitoneum. Musculoskeletal: No acute abnormalities. IMPRESSION: 1. Small bowel obstruction likely due to adhesive disease just below the patient's laparotomy scar. 2. No emergent vascular finding. There is atherosclerosis without flow limiting stenosis of major vessels. 3. Infraumbilical midline hernia containing small bowel, nonobstructive. Electronically Signed   By: Marnee Spring M.D.   On: 12/12/2020 06:53     Signed: Carmel Sacramento, MD Internal Medicine Resident, PGY-1 [facility]  Pager: [phone] 2:19 PM, 12/13/2020

## 2020-12-13 NOTE — Progress Notes (Signed)
Subjective/Chief Complaint: Pt doing well having BMs Abd pain better   Objective: Vital signs in last 24 hours: Temp:  [97.6 F (36.4 C)-98.4 F (36.9 C)] 98.4 F (36.9 C) (08/19 0441) Pulse Rate:  [64-104] 79 (08/19 0441) Resp:  [14-20] 17 (08/19 0441) BP: (117-171)/(42-99) 171/58 (08/19 0441) SpO2:  [92 %-100 %] 97 % (08/19 0441) Last BM Date: 12/12/20  Intake/Output from previous day: 08/18 0701 - 08/19 0700 In: 1720.8 [I.V.:1720.8] Out: 131 [Urine:1; Emesis/NG output:130] Intake/Output this shift: No intake/output data recorded.  PE:  Constitutional: No acute distress, conversant, appears states age. Eyes: Anicteric sclerae, moist conjunctiva, no lid lag Lungs: Clear to auscultation bilaterally, normal respiratory effort CV: regular rate and rhythm, no murmurs, no peripheral edema, pedal pulses 2+ GI: Soft, no masses or hepatosplenomegaly, non-tender to palpation Skin: No rashes, palpation reveals normal turgor Psychiatric: appropriate judgment and insight, oriented to person, place, and time   Lab Results:  Recent Labs    12/12/20 0445 12/13/20 0252  WBC 20.2* 9.2  HGB 14.9 12.6  HCT 47.0* 39.8  PLT 440* 291   BMET Recent Labs    12/12/20 0445 12/13/20 0252  NA 137 137  K 4.2 3.7  CL 96* 101  CO2 26 27  GLUCOSE 249* 103*  BUN 11 10  CREATININE 0.82 0.74  CALCIUM 10.8* 8.7*   PT/INR Recent Labs    12/12/20 0700 12/13/20 0252  LABPROT 31.1* 27.8*  INR 3.0* 2.6*   ABG No results for input(s): PHART, HCO3 in the last 72 hours.  Invalid input(s): PCO2, PO2  Studies/Results: DG Abd Portable 1V-Small Bowel Obstruction Protocol-initial, 8 hr delay  Result Date: 12/13/2020 CLINICAL DATA:  8 hour small-bowel obstruction follow-up 3 study EXAM: PORTABLE ABDOMEN - 1 VIEW COMPARISON:  CT and plain film from earlier in the same day. FINDINGS: Gastric catheter is again seen in the stomach. Previously administered contrast now lies within the  colon. Previously seen dilated loops of small bowel are less prevalent. No free air is noted. IMPRESSION: Contrast material now lies within the colon with decreased small bowel dilatation. Electronically Signed   By: Alcide Clever M.D.   On: 12/13/2020 01:02   DG Abd Portable 1V-Small Bowel Protocol-Position Verification  Result Date: 12/12/2020 CLINICAL DATA:  NG tube placement EXAM: PORTABLE ABDOMEN - 1 VIEW COMPARISON:  Abdominal radiographs obtained earlier the same day FINDINGS: The enteric catheter is coiled in the stomach. The tip and side hole project over the stomach. There is a nonobstructive bowel gas pattern. There is no gross organomegaly or abnormal soft tissue calcification. The lung bases are clear. The bones are unremarkable. IMPRESSION: NG tube tip and side hole project over the stomach. Electronically Signed   By: Lesia Hausen M.D.   On: 12/12/2020 10:56   DG Abd Portable 1 View  Result Date: 12/12/2020 CLINICAL DATA:  NG tube placement EXAM: PORTABLE ABDOMEN - 1 VIEW COMPARISON: CTA of earlier today FINDINGS: Nasogastric tube is looped in and terminates in the proximal stomach. No gross free intraperitoneal air. IMPRESSION: Nasogastric tube terminating in the proximal stomach. Consider advancement. Electronically Signed   By: Jeronimo Greaves M.D.   On: 12/12/2020 08:46   CT Angio Abd/Pel W and/or Wo Contrast  Result Date: 12/12/2020 CLINICAL DATA:  Abdominal pain, question acute mesenteric ischemia EXAM: CTA ABDOMEN AND PELVIS WITHOUT AND WITH CONTRAST TECHNIQUE: Multidetector CT imaging of the abdomen and pelvis was performed using the standard protocol during bolus administration of intravenous contrast. Multiplanar  reconstructed images and MIPs were obtained and reviewed to evaluate the vascular anatomy. CONTRAST:  28mL OMNIPAQUE IOHEXOL 350 MG/ML SOLN COMPARISON:  None. FINDINGS: VASCULAR Aorta: Scattered atheromatous plaque. No dissection, aneurysm, or wall thickening Celiac: Plaque  at the ostium without flow reducing stenosis. Major branches are patent SMA: Plaque at the ostium without flow reducing stenosis. No branch occlusion, beading, or aneurysm. Renals: Atheromatous calcification at the left ostium. No stenosis, beading, or dissection. IMA: Patent Inflow: Minimal atheromatous changes. Proximal Outflow: Unremarkable Veins: Normal Review of the MIP images confirms the above findings. NON-VASCULAR Lower chest:  No contributory findings. Hepatobiliary: Borderline hepatic steatosis.No evidence of biliary obstruction or stone. Pancreas: Unremarkable. Spleen: Unremarkable. Adrenals/Urinary Tract: Negative adrenals. No hydronephrosis or stone. Unremarkable bladder. Stomach/Bowel: Dilated small bowel loops with mesenteric edema and fluid filling with transition point in the ventral abdomen beneath a laparotomy scar where there is a small, small bowel containing infraumbilical hernia and bowel loop distortion suggesting adhesions. No bowel ischemia noted. Subtotal colectomy. Lymphatic: Negative for adenopathy or mass. Reproductive:No pathologic findings. Other: No ascites or pneumoperitoneum. Musculoskeletal: No acute abnormalities. IMPRESSION: 1. Small bowel obstruction likely due to adhesive disease just below the patient's laparotomy scar. 2. No emergent vascular finding. There is atherosclerosis without flow limiting stenosis of major vessels. 3. Infraumbilical midline hernia containing small bowel, nonobstructive. Electronically Signed   By: Marnee Spring M.D.   On: 12/12/2020 06:53    Anti-infectives: Anti-infectives (From admission, onward)    None       Assessment/Plan: Recurrent small bowel obstruction -appears to have resolved Will DC NGT and start clear Pt would likely benefit from staying on a liquid diet until she returns home back to [place].     FEN -clears VTE -OK to resume coumadin ID wbc resolved   DM HLD Lupus anticoagulant disorder Depression   LOS: 1 day     Axel Filler 12/13/2020

## 2020-12-13 NOTE — Progress Notes (Signed)
  Date: 12/13/2020  Patient name: Terri Morales  Medical record number: [id number]  Date of birth: Aug 24, 1950   I have seen and evaluated [name] and discussed their care with the Residency Team. Briefly, Ms. Hegwood is a 70 year old woman who presented with an SBO related to previous ischemic colitis and multiple surgeries.  This morning, she has the NGT out, she has had bowel movements (diarrhea per her) and abdominal pain is improved.  She is going to try liquids today and if she does well she may return home.  She is from [place] and would prefer that her care remain there and she not have surgery at this time.   Vitals:   12/13/20 0036 12/13/20 0441  BP: (!) 143/64 (!) 171/58  Pulse: 64 79  Resp: 18 17  Temp: 98.1 F (36.7 C) 98.4 F (36.9 C)  SpO2: 94% 97%   Gen: Alert, no distress Eyes: Anicteric sclerae, no injection HENT: Neck is supple CV: RR, NR, no murmur, no LE swelling Pulm: Breathing comfortably on room air, no wheezing Abd: Soft, +BS throughout MSK: normal tone and bulk Psych: Pleasant, normal mood.   Assessment and Plan: I have seen and evaluated the patient as outlined above. I agree with the formulated Assessment and Plan as detailed in the residents' note, with the following changes:   1. SBO - Improved this morning, she is ready to have liquids today - + BM - AXR improved - Surgery team  is following, will discuss possible discharge with this team if she tolerates liquids  Other issues per Dr. Eliane Decree daily note.   Inez Catalina, MD 8/19/202211:36 AM

## 2020-12-13 NOTE — Plan of Care (Signed)
All goals met discharged at this time.

## 2020-12-16 ENCOUNTER — Ambulatory Visit
Admit: 2020-12-16 | Payer: PRIVATE HEALTH INSURANCE | Attending: Pharmacist Clinician (PhC)/ Clinical Pharmacy Specialist | Primary: Internal Medicine

## 2020-12-16 DIAGNOSIS — D6862 Lupus anticoagulant syndrome: Secondary | ICD-10-CM

## 2020-12-16 DIAGNOSIS — K559 Vascular disorder of intestine, unspecified: Secondary | ICD-10-CM

## 2020-12-19 ENCOUNTER — Inpatient Hospital Stay: Admit: 2020-12-19 | Discharge: 2020-12-19 | Payer: PRIVATE HEALTH INSURANCE | Primary: Internal Medicine

## 2020-12-19 DIAGNOSIS — Z7901 Long term (current) use of anticoagulants: Secondary | ICD-10-CM

## 2020-12-19 DIAGNOSIS — D6862 Lupus anticoagulant syndrome: Secondary | ICD-10-CM

## 2020-12-19 DIAGNOSIS — K559 Vascular disorder of intestine, unspecified: Secondary | ICD-10-CM

## 2020-12-19 DIAGNOSIS — Z5181 Encounter for therapeutic drug level monitoring: Secondary | ICD-10-CM

## 2020-12-19 LAB — PROTIME AND INR
BKR INR: 2.69 — ABNORMAL HIGH (ref 0.87–1.14)
BKR PROTHROMBIN TIME: 26.6 seconds — ABNORMAL HIGH (ref 9.6–12.3)

## 2020-12-20 ENCOUNTER — Ambulatory Visit: Admit: 2020-12-20 | Payer: PRIVATE HEALTH INSURANCE | Attending: Pharmacotherapy | Primary: Internal Medicine

## 2020-12-20 ENCOUNTER — Encounter: Admit: 2020-12-20 | Payer: PRIVATE HEALTH INSURANCE | Attending: Pharmacotherapy | Primary: Internal Medicine

## 2020-12-20 DIAGNOSIS — D6862 Lupus anticoagulant syndrome: Secondary | ICD-10-CM

## 2020-12-20 DIAGNOSIS — E119 Type 2 diabetes mellitus without complications: Secondary | ICD-10-CM

## 2020-12-20 DIAGNOSIS — R42 Dizziness and giddiness: Secondary | ICD-10-CM

## 2020-12-20 DIAGNOSIS — K559 Vascular disorder of intestine, unspecified: Secondary | ICD-10-CM

## 2020-12-20 DIAGNOSIS — K55039 Acute (reversible) ischemia of large intestine, extent unspecified: Secondary | ICD-10-CM

## 2020-12-20 DIAGNOSIS — K436 Other and unspecified ventral hernia with obstruction, without gangrene: Secondary | ICD-10-CM

## 2020-12-20 DIAGNOSIS — E7800 Pure hypercholesterolemia, unspecified: Secondary | ICD-10-CM

## 2020-12-20 DIAGNOSIS — F32A Depression: Secondary | ICD-10-CM

## 2020-12-20 DIAGNOSIS — L719 Rosacea, unspecified: Secondary | ICD-10-CM

## 2020-12-20 NOTE — Progress Notes
Pharmacist Anticoagulation Clinic Telephone Visit Follow Maria [name] (07-24-1950) is on chronic anticoagulation for the diagnosis of  Lupus anticoagulant with hypercoagulable state (HC Code) (HC CODE) (HC Code)  (primary encounter diagnosis) Ischemic colitis (HC Code) with an INR goal of 2.0-3.0.  The patient was contacted regarding lab drawn INR results.  Patient's INR findings and prescribed dose from last visit:Anticoagulation Monitoring Latest Ref Rng & Units 12/12/2020 12/16/2020 12/20/2020 INR 2 - 3 - - - INR 0.91 - 1.10 - - - INR (External) 0.8 - 1.15 3.00(A) - - Assoc. INR Date - 12/12/2020 - 12/19/2020 Associated INR - 3.00 - 2.69 Instructions - 2.5 mg every Thu; 5 mg all other days 2.5 mg every Thu; 5 mg all other days 5 mg every day Last 7 day dose total - 35 mg 25 mg 30 mg Next INR Date - 12/18/2020 12/18/2020 12/26/2020 The following findings were reported from today's call (no findings if none selected):[]  Missed doses []  Extra doses []  Change in medications []  Change in vitamin k rich food intake (green vegetables, herbals, etc) []  Change in alcohol use []  Change in overall health []  Recent hospitalization / emergency department visit []  Upcoming procedure (including dental procedures) []  Other Comments:       No findings.  Patient is eating a regular diet now.  The following side effects were reported from today's call (no side effects if none selected):[]  Bleeding []  New bruising []  Warfarin-emergency department visit or hospital admission []  Other Comments:   No side effects Assessment/Plan:Patient's INR is 2.69, which is within goal range of 2.0-3.0.  She was instructed to continue taking 5mg  warfarin daily as indicated in the maintenance plan below.  Next INR assessment due 1 week.  Anticoagulation Summary  As of 12/20/2020  INR goal:  2.0-3.0 TTR:  45.5 % (6.5 y) INR used for dosing:  2.69 (12/19/2020) Warfarin maintenance plan:  5 mg (5 mg x 1) every day Weekly warfarin total:  35 mg Plan last modified:  Markus Daft, [facility] (12/20/2020) Next INR check:  12/26/2020 Priority:  High Priority Target end date:  Indefinite  Indications  Lupus anticoagulant with hypercoagulable state (HC Code) (HC CODE) (HC Code) [D68.62]Ischemic colitis (HC Code) [K55.9]   Anticoagulation Episode Summary   INR check location:    Preferred lab:    Send INR reminders to:  [facility] [place] INR POOL  Comments:    Anticoagulation Care Providers   Provider Role Specialty Phone number  [name], MD Responsible Medical Oncology [phone]  [name], [place] Responsible Medical Oncology [phone]  All patient's medications have been reviewed and updated as needed.  Electronically Signed by Markus Daft, RPH, December 20, 2020 [facility]  [address], [place], [place] [address]  Phone:[phone]  Fax: [phone]

## 2020-12-26 ENCOUNTER — Inpatient Hospital Stay: Admit: 2020-12-26 | Discharge: 2020-12-26 | Payer: PRIVATE HEALTH INSURANCE | Primary: Internal Medicine

## 2020-12-26 DIAGNOSIS — K559 Vascular disorder of intestine, unspecified: Secondary | ICD-10-CM

## 2020-12-26 DIAGNOSIS — Z7901 Long term (current) use of anticoagulants: Secondary | ICD-10-CM

## 2020-12-26 DIAGNOSIS — Z5181 Encounter for therapeutic drug level monitoring: Secondary | ICD-10-CM

## 2020-12-26 DIAGNOSIS — D6862 Lupus anticoagulant syndrome: Secondary | ICD-10-CM

## 2020-12-26 LAB — PROTIME AND INR
BKR INR: 3.43 — ABNORMAL HIGH (ref 0.87–1.14)
BKR PROTHROMBIN TIME: 33.1 seconds — ABNORMAL HIGH (ref 9.6–12.3)

## 2020-12-27 ENCOUNTER — Emergency Department: Admit: 2020-12-27 | Payer: PRIVATE HEALTH INSURANCE | Primary: Internal Medicine

## 2020-12-27 ENCOUNTER — Inpatient Hospital Stay: Admit: 2020-12-27 | Discharge: 2020-12-27 | Payer: PRIVATE HEALTH INSURANCE | Attending: Emergency Medicine

## 2020-12-27 ENCOUNTER — Encounter: Admit: 2020-12-27 | Payer: PRIVATE HEALTH INSURANCE | Primary: Internal Medicine

## 2020-12-27 ENCOUNTER — Ambulatory Visit
Admit: 2020-12-27 | Payer: PRIVATE HEALTH INSURANCE | Attending: Pharmacist Clinician (PhC)/ Clinical Pharmacy Specialist | Primary: Internal Medicine

## 2020-12-27 DIAGNOSIS — L719 Rosacea, unspecified: Secondary | ICD-10-CM

## 2020-12-27 DIAGNOSIS — D6862 Lupus anticoagulant syndrome: Secondary | ICD-10-CM

## 2020-12-27 DIAGNOSIS — Z888 Allergy status to other drugs, medicaments and biological substances status: Secondary | ICD-10-CM

## 2020-12-27 DIAGNOSIS — R42 Dizziness and giddiness: Secondary | ICD-10-CM

## 2020-12-27 DIAGNOSIS — Z7901 Long term (current) use of anticoagulants: Secondary | ICD-10-CM

## 2020-12-27 DIAGNOSIS — J020 Streptococcal pharyngitis: Secondary | ICD-10-CM

## 2020-12-27 DIAGNOSIS — K559 Vascular disorder of intestine, unspecified: Secondary | ICD-10-CM

## 2020-12-27 DIAGNOSIS — Z79899 Other long term (current) drug therapy: Secondary | ICD-10-CM

## 2020-12-27 DIAGNOSIS — K436 Other and unspecified ventral hernia with obstruction, without gangrene: Secondary | ICD-10-CM

## 2020-12-27 DIAGNOSIS — Z91040 Latex allergy status: Secondary | ICD-10-CM

## 2020-12-27 DIAGNOSIS — E119 Type 2 diabetes mellitus without complications: Secondary | ICD-10-CM

## 2020-12-27 DIAGNOSIS — K55039 Acute (reversible) ischemia of large intestine, extent unspecified: Secondary | ICD-10-CM

## 2020-12-27 DIAGNOSIS — R591 Generalized enlarged lymph nodes: Secondary | ICD-10-CM

## 2020-12-27 DIAGNOSIS — E7800 Pure hypercholesterolemia, unspecified: Secondary | ICD-10-CM

## 2020-12-27 DIAGNOSIS — Z885 Allergy status to narcotic agent status: Secondary | ICD-10-CM

## 2020-12-27 DIAGNOSIS — R070 Pain in throat: Secondary | ICD-10-CM

## 2020-12-27 DIAGNOSIS — F32A Depression: Secondary | ICD-10-CM

## 2020-12-27 LAB — CBC WITH AUTO DIFFERENTIAL
BKR WAM ABSOLUTE IMMATURE GRANULOCYTES.: 0.03 x 1000/ÂµL (ref 0.00–0.30)
BKR WAM ABSOLUTE LYMPHOCYTE COUNT.: 1.54 x 1000/ÂµL (ref 0.60–3.70)
BKR WAM ABSOLUTE NRBC (2 DEC): 0 x 1000/ÂµL (ref 0.00–1.00)
BKR WAM ANALYZER ANC: 7.3 x 1000/ÂµL (ref 2.00–7.60)
BKR WAM BASOPHIL ABSOLUTE COUNT.: 0.05 x 1000/ÂµL (ref 0.00–1.00)
BKR WAM BASOPHILS: 0.5 % (ref 0.0–1.4)
BKR WAM EOSINOPHIL ABSOLUTE COUNT.: 0.17 x 1000/ÂµL (ref 0.00–1.00)
BKR WAM EOSINOPHILS: 1.8 % (ref 0.0–5.0)
BKR WAM HEMATOCRIT (2 DEC): 44.5 % (ref 35.00–45.00)
BKR WAM HEMOGLOBIN: 14.1 g/dL (ref 11.7–15.5)
BKR WAM IMMATURE GRANULOCYTES: 0.3 % (ref 0.0–1.0)
BKR WAM LYMPHOCYTES: 16 % — ABNORMAL LOW (ref 17.0–50.0)
BKR WAM MCH (PG): 26.2 pg — ABNORMAL LOW (ref 27.0–33.0)
BKR WAM MCHC: 31.7 g/dL (ref 31.0–36.0)
BKR WAM MCV: 82.6 fL (ref 80.0–100.0)
BKR WAM MONOCYTE ABSOLUTE COUNT.: 0.56 x 1000/ÂµL (ref 0.00–1.00)
BKR WAM MONOCYTES: 5.8 % (ref 4.0–12.0)
BKR WAM MPV: 10.4 fL (ref 8.0–12.0)
BKR WAM NEUTROPHILS: 75.6 % — ABNORMAL HIGH (ref 39.0–72.0)
BKR WAM NUCLEATED RED BLOOD CELLS: 0 % (ref 0.0–1.0)
BKR WAM PLATELETS: 272 x1000/ÂµL (ref 150–420)
BKR WAM RDW-CV: 15.4 % — ABNORMAL HIGH (ref 11.0–15.0)
BKR WAM RED BLOOD CELL COUNT.: 5.39 M/ÂµL (ref 4.00–6.00)
BKR WAM WHITE BLOOD CELL COUNT: 9.7 x1000/ÂµL (ref 4.0–11.0)

## 2020-12-27 MED ORDER — PENICILLIN G BENZATHINE 1,200,000 UNIT/2 ML INTRAMUSCULAR SYRINGE
1200000 unit/2 mL | Freq: Once | INTRAMUSCULAR | Status: CP
Start: 2020-12-27 — End: ?
  Administered 2020-12-27: 20:00:00 via INTRAMUSCULAR

## 2020-12-27 MED ORDER — ACETAMINOPHEN 325 MG TABLET
325 mg | Freq: Once | ORAL | Status: CP
Start: 2020-12-27 — End: ?
  Administered 2020-12-27: 16:00:00 325 mg via ORAL

## 2020-12-27 MED ORDER — ENOXAPARIN 100 MG/ML SUBCUTANEOUS SYRINGE
100 mg/mL | Freq: Every day | SUBCUTANEOUS | 1 refills | Status: SS
Start: 2020-12-27 — End: 2021-03-07

## 2020-12-27 NOTE — Progress Notes
Pharmacist Anticoagulation Clinic Telephone Visit Follow Maria [name] ([birth year]) is on chronic anticoagulation for the diagnosis of  Lupus anticoagulant with hypercoagulable state (HC Code) (HC CODE) (HC Code)  (primary encounter diagnosis) Ischemic colitis (HC Code) with an INR goal of 2.0-3.0. The patient was contacted regarding lab drawn INR results.  Patient's INR findings and prescribed dose from last visit:Anticoagulation Monitoring Latest Ref Rng & Units 12/16/2020 12/20/2020 12/27/2020 INR 2 - 3 - - - INR 0.91 - 1.10 - - - INR (External) 0.8 - 1.15 - - - Assoc. INR Date - - 12/19/2020 12/26/2020 Associated INR - - 2.69 3.43 Instructions - 2.5 mg every Thu; 5 mg all other days 5 mg every day 5 mg every day Last 7 day dose total - 25 mg 30 mg 35 mg Next INR Date - 12/18/2020 12/26/2020 01/02/2021 The following findings were reported from today's call (no findings if none selected):[]  Missed doses []  Extra doses []  Change in medications []  Change in vitamin k rich food intake (green vegetables, herbals, etc) []  Change in alcohol use []  Change in overall health []  Recent hospitalization / emergency department visit []  Upcoming procedure (including dental procedures) []  Other Comments:       Patient tested positive for Group A Strept, received a shot of penicillin.  The following side effects were reported from today's call (no side effects if none selected):[]  Bleeding []  New bruising []  Warfarin-emergency department visit or hospital admission []  Other Comments:   No side effects Assessment/Plan:Patient's INR is 3.43, which is above goal range of 2.0-3.0.  Patient was in the ED today for painful throat and her rapid Strep A test came back positive. She received one shot of penicillin, which can increase the INR. She was instructed to skip her warfarin dose today and lower weekly dose to 2.5 mg on Mon, 5 mg all other days as indicated in the maintenance plan below.  Next INR assessment due 1 week. This information was routed to the covering provider for review. New script for enoxaparin 100 mg syringes sent to her pharmacy for upcoming procedure. Has #7 syringes at home. We will review below instructions next week in detail when patient is home and able to write down instructions. Before procedure: - 9/13: Last dose of warfarin before procedure. - 9/14: Do not take any warfarin or inject any enoxaparin (Lovenox). - 9/15: Inject enoxaparin 100 mg under the skin once in the morning. Do not take any warfarin.- 9/16: Inject enoxaparin 100 mg under the skin once in the morning. Do not take any warfarin.- 9/17: Inject enoxaparin 100 mg under the skin once in the morning. Do not take any warfarin.- 9/18: Inject enoxaparin 100 mg under the skin once in the morning. Do not take any warfarin. Day of procedure:- 9/19: Do not inject any enoxaparin before your procedure. After the procedure, if approved by your doctor, restart enoxaparin 100 mg once at night. Take warfarin 7.5 mg at night.  After procedure:- 9/20: Inject enoxaparin 100 mg under the skin at night and take warfarin 5 mg- 9/21: Inject enoxaparin 100 mg under the skin at night and take warfarin 5 mg- 9/22: Test your INR and we will call with further dosing instructions Anticoagulation Summary  As of 12/27/2020  INR goal:  2.0-3.0 TTR:  45.5 % (6.6 y) INR used for dosing:  3.43 (12/26/2020) Warfarin maintenance plan:  2.5 mg (5 mg x 0.5) every Mon; 5 mg (5 mg x 1) all other days Weekly warfarin total:  32.5 mg Plan last modified:  [name], PharmD (12/27/2020) Next INR check:  01/02/2021 Priority:  High Priority Target end date:  Indefinite  Indications  Lupus anticoagulant with hypercoagulable state (HC Code) (HC CODE) (HC Code) [D68.62]Ischemic colitis (HC Code) [K55.9]   Anticoagulation Episode Summary   INR check location:    Preferred lab:    Send INR reminders to:  [facility] [place] INR POOL  Comments:    Anticoagulation Care Providers   Provider Role Specialty Phone number  [name], MD Responsible Medical Oncology [phone]  [name], [place] Responsible Medical Oncology [phone]  All patient's medications have been reviewed and updated as needed.  Electronically Signed by [name], PharmD, December 27, 2020 [facility]  [address], [place], [place] [address]  [phone]  Fax: [phone]

## 2020-12-27 NOTE — Discharge Instructions
Stay well hydratedTake Tylenol as needed/directed for painCall your doctor today to arrange outpatient follow-up.

## 2020-12-27 NOTE — Progress Notes
Pharmacist Anticoagulation Clinic Telephone Visit Follow Maria [name] ([birth year]) is on chronic anticoagulation for the diagnosis of  Lupus anticoagulant with hypercoagulable state (HC Code) (HC CODE) (HC Code)  (primary encounter diagnosis) Ischemic colitis (HC Code) with an INR goal of 2.0-3.0.  The patient was contacted regarding lab drawn INR results.  Patient's INR findings and prescribed dose from last visit:Anticoagulation Monitoring Latest Ref Rng & Units 12/11/2020 12/12/2020 12/16/2020 INR 2 - 3 - - - INR 0.91 - 1.10 - - - INR (External) 0.8 - 1.15 3.90(A) 3.00(A) - Assoc. INR Date - - 12/12/2020 - Associated INR - - 3.00 - Instructions - - 2.5 mg every Thu; 5 mg all other days 2.5 mg every Thu; 5 mg all other days Last 7 day dose total - - 35 mg 32.5 mg Next INR Date - - 12/18/2020 - The following findings were reported from today's call (no findings if none selected):[x]  Missed doses []  Extra doses [x]  Change in medications [x]  Change in vitamin k rich food intake (green vegetables, herbals, etc) []  Change in alcohol use [x]  Change in overall health [x]  Recent hospitalization / emergency department visit [x]  Upcoming procedure (including dental procedures) []  Other Comments:       Admitted 8/18-8/19 for small bowel obstruction; had NG tube overnight, XR showed resolution of SBO and pt was advanced to clear liquid diet and advised to continue clear liquid diet upon discharge, per Care Everywhere chart notes from [facility], [place]. Did not receive her usual vit K 100 mcg supplement at the hospital.   Was on a liquid diet for 3 days, now eating rice and liquids.Has upcoming right finger surgery for carpal tunnel on 01/13/21, with local anesthesia.     The following side effects were reported from today's call (no side effects if none selected):[]  Bleeding []  New bruising []  Warfarin-emergency department visit or hospital admission []  Other Comments:   No side effects Assessment/Plan:Patient's INR was 3.0, which is within goal range of 2.0-3.0.  Patient reports not taking any warfarin Thu/Fri, then 5 mg on Sat/Sun. She is planning to be back tomorrow in [place].  She was instructed to continue on warfarin 5 mg daily and retest her INR on 8/24. This information was routed to the covering provider for review. Has upcoming right finger surgery for carpal tunnel on 01/13/21, with local anesthesia. Patient reports she still has some syringes at home, will let us know how many at our next call. Bridging plan: patient is to come off of warfarin for 5 full days prior to surgery, and bridge with Lovenox 1.5 mg/kg daily x 4 days prior to procedure, last dose 24 hours before procedure. ?Before procedure: -?9/13: Last dose of warfarin before procedure. -?9/14: Do not take any warfarin or inject any Lovenox. -?9/15: Inject Lovenox?100?mg under the skin once in the morning. Do not take any warfarin.-?9/16:?Inject Lovenox?100?mg under the skin once in the morning. Do not take any warfarin.-?9/17: Inject Lovenox?100?mg under the skin once in the morning. Do not take any warfarin.-?9/18: Inject Lovenox?100?mg under the skin once in the morning. Do not take any warfarin.?Day of procedure:- 9/19: Do not inject any Lovenox before your procedure. After the procedure,?if approved by your doctor, restart Lovenox 100 mg once at night. Take warfarin 7.5 mg at night. ?After procedure:- 9/20: Inject Lovenox 100 mg under the skin at night and take warfarin 5 mg- 9/21: Inject Lovenox 100 mg under the skin at night and take warfarin 5 mg- 9/22: Test  your INR and we will call with further dosing instructions Anticoagulation Summary  As of 12/16/2020  INR goal:  2.0-3.0 TTR:  45.3 % (6.5 y) INR used for dosing:  No new INR was available at the time of this encounter. Warfarin maintenance plan:  2.5 mg (5 mg x 0.5) every Thu; 5 mg (5 mg x 1) all other days Weekly warfarin total:  32.5 mg Plan last modified:  [name], PharmD (12/06/2020) Next INR check:  12/18/2020 Priority:  High Priority Target end date:  Indefinite  Indications  Lupus anticoagulant with hypercoagulable state (HC Code) (HC CODE) (HC Code) [D68.62]Ischemic colitis (HC Code) [K55.9]   Anticoagulation Episode Summary   INR check location:    Preferred lab:    Send INR reminders to:  [facility] [place] INR POOL  Comments:    Anticoagulation Care Providers   Provider Role Specialty Phone number  [name], MD Responsible Medical Oncology [phone]  [name], [place] Responsible Medical Oncology [phone]  All patient's medications have been reviewed and updated as needed.  Electronically Signed by [name], PharmD, December 16, 2020 [facility] Anticoagulation [facility]  [address] [place], [place] [address]  Phone:[phone]  Fax: [phone]

## 2020-12-27 NOTE — ED Provider Notes
HistoryChief Complaint Patient presents with ? Medical Problem   Pt c/o chin/throat pain, swelling to Lt chin/throat. Pt managing secretions, reports pain with swallowing but no difficulty swallowing. Clear speech.   Pt comes to the ER for evaluation of left submandibular swelling which began yesterday.   No fever.  PMHX: reviewedSOCIAL HX: denies tobacco, ethanol or illicit drugs use  Past Medical History: Diagnosis Date ? Acute ischemic colitis (HC Code) (HC CODE) (HC Code) 2015 ? Depression  ? Diabetes mellitus (HC Code) (HC CODE) (HC Code)  ? Hypercholesteremia  ? Lupus anticoagulant disorder (HC Code) (HC CODE) (HC Code)  ? Rosacea  ? Ventral hernia with bowel obstruction  ? Vertigo  Past Surgical History: Procedure Laterality Date ? ABDOMINAL ADHESION SURGERY  2016 ? BLEPHAROPLASTY Bilateral 05/2016 ? COLOSTOMY  05/27/13 ? COLOSTOMY CLOSURE   ? HYSTEROSCOPY W/ POLYPECTOMY  06/18/2020 ? KNEE LIGAMENT RECONSTRUCTION Right 2004 ? LAPAROSCOPIC NISSEN FUNDOPLICATION    2000 ? ? SINUS SURGERY   ? THYROID BIOPSY   ? TUBAL LIGATION   ? UMBILICAL HERNIA REPAIR   ? VENTRAL HERNIA REPAIR   Family History Problem Relation Age of Onset ? Bladder cancer Mother  ? Colon cancer Paternal Aunt  ? Colon cancer Cousin       died of colona cancer at age 50 ? Breast cancer Cousin       2 cousins with breast cancer ? Colon cancer Father  Social History Socioeconomic History ? Marital status: Married Tobacco Use ? Smoking status: Never Smoker ? Smokeless tobacco: Never Used Vaping Use ? Vaping Use: Never used Substance and Sexual Activity ? Alcohol use: Not Currently ? Drug use: No ? Sexual activity: Yes   Partners: Male ED Other Social History ? E-cigarette status Never User  ? E-Cigarette Use Never User  E-cigarette/Vaping Substances E-cigarette/Vaping Devices Review of Systems Constitutional: Negative for chills and fever. HENT: Positive for sore throat. Negative for drooling and nosebleeds.  Eyes: Negative for photophobia. Respiratory: Negative for shortness of breath.  Cardiovascular: Negative for leg swelling. Gastrointestinal: Negative for abdominal pain, diarrhea, nausea and vomiting. Genitourinary: Negative for dysuria. Musculoskeletal: Negative for back pain and neck pain. Skin: Negative for pallor. Neurological: Negative for headaches. Psychiatric/Behavioral: Negative for agitation.  Physical ExamED Triage Vitals [12/27/20 1120]BP: (!) 142/71Pulse: 80Pulse from  O2 sat: n/aResp: 16Temp: 98.1 ?F (36.7 ?C)Temp src: OralSpO2: 99 % BP 118/68  - Pulse 63  - Temp 99.5 ?F (37.5 ?C) (Oral)  - Resp 18  - SpO2 97% Physical ExamVitals and nursing note reviewed. Constitutional:     General: She is not in acute distress.   Appearance: Normal appearance. She is not ill-appearing or toxic-appearing. HENT:    Head: Atraumatic.    Mouth/Throat:    Pharynx: Posterior oropharyngeal erythema present. No oropharyngeal exudate.    Comments: No gingival swellingNo sublingual swelling No trismusCardiovascular:    Rate and Rhythm: Normal rate and regular rhythm.    Heart sounds: No murmur heard.  No friction rub. No gallop. Pulmonary:    Effort: Pulmonary effort is normal. No respiratory distress.    Breath sounds: Normal breath sounds. No stridor. No wheezing or rhonchi. Abdominal:    General: There is no distension.    Palpations: There is no mass.    Tenderness: There is no abdominal tenderness. There is no guarding or rebound.    Hernia: No hernia is present. Musculoskeletal:       General: No swelling.    Cervical back: Neck supple. Skin:  General: Skin is warm and dry. Neurological:    General: No focal deficit present.    Mental Status: She is alert and oriented to person, place, and time. Psychiatric:       Mood and Affect: Mood normal.  ProceduresProcedures ED COURSEInterpreted by ED Provider: labs, pulse oximetry and ultrasoundPatient Reevaluation: MDM: Pt comes to the ER for evaluation of left submandibular swelling which began yesterday.   No fever.  Plan: labs, neck u/s, analgesiaAdam Roena Sassaman, MDU/s report reviewed: Final result by Zandra Abts, MD (12/27/20 13:45:40)	Narrative:	Ultrasound head neck soft tissue CLINICAL HISTORY: Left submandibular swelling and pain TECHNIQUE: Grayscale and color Doppler images were obtained in the region of the left submandibular gland. Findings and impression: No hyperemia, fluid collection or solid mass noted. Several normal-appearing lymph nodes. Recommend repeat ultrasound if symptoms persist or worsen. Alternatively, Green Level of the neck could be performed depending upon the severity of symptoms. Reported And Signed By: Merril Abbe, MD ? Pt discharged Abe People, MD3:09 PMClinical Impressions as of 12/27/20 1548 Strep pharyngitis Lymphadenopathy  ED DispositionDischarge Horald Chestnut, MD09/02/22 1138 Horald Chestnut, MD09/02/22 1516 Horald Chestnut, MD09/02/22 [phone]

## 2020-12-27 NOTE — ED Notes
Chief Complaint Patient presents with ? Medical Problem   Pt c/o chin/throat pain, swelling to Lt chin/throat. Pt managing secretions, reports pain with swallowing but no difficulty swallowing. Clear speech.  On assessment, patient reports left sided facial/throat swelling, to soft external tissue. Patient with clear speech, handling secretions.  Reports she first noticed symptoms last night. Patient denies this happening to her in the past. Patient denies trauma, denies sick contacts. 11:56 AM patient awaiting [place].

## 2020-12-27 NOTE — ED Notes
3:15 PM Care assumed. Report taken from [facility]. Pt AOx4, VSS on RA. NAD noted. Pending POC.Past Medical History: Diagnosis Date ? Acute ischemic colitis (HC Code) (HC CODE) (HC Code) 2015 ? Depression  ? Diabetes mellitus (HC Code) (HC CODE) (HC Code)  ? Hypercholesteremia  ? Lupus anticoagulant disorder (HC Code) (HC CODE) (HC Code)  ? Rosacea  ? Ventral hernia with bowel obstruction  ? Vertigo   Past Surgical History: Procedure Laterality Date ? ABDOMINAL ADHESION SURGERY  2016 ? BLEPHAROPLASTY Bilateral 05/2016 ? COLOSTOMY  05/27/13 ? COLOSTOMY CLOSURE   ? HYSTEROSCOPY W/ POLYPECTOMY  06/18/2020 ? KNEE LIGAMENT RECONSTRUCTION Right 2004 ? LAPAROSCOPIC NISSEN FUNDOPLICATION    2000 ? ? SINUS SURGERY   ? THYROID BIOPSY   ? TUBAL LIGATION   ? UMBILICAL HERNIA REPAIR   ? VENTRAL HERNIA REPAIR    4:01 PM Pt cleared for discharge by provider. Pt remains AO x 4. VSS on RA. Education handouts discussed, pt able to teach back. Belongings remain with pt. Pt leaving ED via self.

## 2021-01-03 ENCOUNTER — Ambulatory Visit
Admit: 2021-01-03 | Payer: PRIVATE HEALTH INSURANCE | Attending: Pharmacist Clinician (PhC)/ Clinical Pharmacy Specialist | Primary: Internal Medicine

## 2021-01-03 ENCOUNTER — Inpatient Hospital Stay: Admit: 2021-01-03 | Discharge: 2021-01-03 | Payer: PRIVATE HEALTH INSURANCE | Primary: Internal Medicine

## 2021-01-03 DIAGNOSIS — D6862 Lupus anticoagulant syndrome: Secondary | ICD-10-CM

## 2021-01-03 DIAGNOSIS — Z5181 Encounter for therapeutic drug level monitoring: Secondary | ICD-10-CM

## 2021-01-03 DIAGNOSIS — Z7901 Long term (current) use of anticoagulants: Secondary | ICD-10-CM

## 2021-01-03 DIAGNOSIS — K559 Vascular disorder of intestine, unspecified: Secondary | ICD-10-CM

## 2021-01-03 LAB — PROTIME AND INR
BKR INR: 1.27 % — ABNORMAL HIGH (ref 0.92–1.19)
BKR PROTHROMBIN TIME: 13 s — ABNORMAL HIGH (ref 9.6–12.3)

## 2021-01-06 ENCOUNTER — Telehealth
Admit: 2021-01-06 | Payer: PRIVATE HEALTH INSURANCE | Attending: Pharmacist Clinician (PhC)/ Clinical Pharmacy Specialist | Primary: Internal Medicine

## 2021-01-06 NOTE — Telephone Encounter
Received call from patient, she would like a call back from Nadya to discuss if she needs to take Vitamin K. Please contact patient to discuss [phone].

## 2021-01-06 NOTE — Telephone Encounter
Call returned to patient; we discussed that she can stop the vitamin K while she is off of warfarin and can resume when she is back on it. [name], PharmD, BCPSClinical Pharmacist II, Anticoagulation ClinicSmilow [facility] and [facility]

## 2021-01-07 ENCOUNTER — Ambulatory Visit: Admit: 2021-01-07 | Payer: PRIVATE HEALTH INSURANCE | Primary: Internal Medicine

## 2021-01-13 ENCOUNTER — Inpatient Hospital Stay
Admit: 2021-01-13 | Discharge: 2021-01-13 | Payer: PRIVATE HEALTH INSURANCE | Attending: Surgery of the Hand | Primary: Internal Medicine

## 2021-01-13 ENCOUNTER — Telehealth: Admit: 2021-01-13 | Payer: PRIVATE HEALTH INSURANCE | Attending: Surgery of the Hand | Primary: Internal Medicine

## 2021-01-13 ENCOUNTER — Encounter: Admit: 2021-01-13 | Payer: PRIVATE HEALTH INSURANCE | Attending: Surgery of the Hand | Primary: Internal Medicine

## 2021-01-13 DIAGNOSIS — L719 Rosacea, unspecified: Secondary | ICD-10-CM

## 2021-01-13 DIAGNOSIS — D6862 Lupus anticoagulant syndrome: Secondary | ICD-10-CM

## 2021-01-13 DIAGNOSIS — F32A Depression: Secondary | ICD-10-CM

## 2021-01-13 DIAGNOSIS — K55039 Acute (reversible) ischemia of large intestine, extent unspecified: Secondary | ICD-10-CM

## 2021-01-13 DIAGNOSIS — E119 Type 2 diabetes mellitus without complications: Secondary | ICD-10-CM

## 2021-01-13 DIAGNOSIS — R42 Dizziness and giddiness: Secondary | ICD-10-CM

## 2021-01-13 DIAGNOSIS — E7800 Pure hypercholesterolemia, unspecified: Secondary | ICD-10-CM

## 2021-01-13 DIAGNOSIS — K436 Other and unspecified ventral hernia with obstruction, without gangrene: Secondary | ICD-10-CM

## 2021-01-13 MED ORDER — ACETAMINOPHEN 500 MG TABLET
500 mg | ORAL_TABLET | ORAL | 12 refills | Status: AC | PRN
Start: 2021-01-13 — End: ?

## 2021-01-13 MED ORDER — SODIUM CHLORIDE 0.9 % IRRIGATION SOLUTION
0.9 % irrigation | Status: CP | PRN
Start: 2021-01-13 — End: ?
  Administered 2021-01-13: 12:00:00 0.9 % irrigation

## 2021-01-13 MED ORDER — LIDOCAINE (PF) 20 MG/ML (2 %) INTRAVENOUS SOLUTION
20 mg/mL (2 %) | Status: DC
Start: 2021-01-13 — End: 2021-01-13

## 2021-01-13 MED ORDER — BUPIVACAINE (PF) 0.5 % (5 MG/ML) INJECTION SOLUTION
0.5 % (5 mg/mL) | Status: DC
Start: 2021-01-13 — End: 2021-01-13

## 2021-01-13 MED ORDER — LIDOCAINE 2% + MARCAINE 0.5% (1:1) (MIXTURE)
Status: DC | PRN
Start: 2021-01-13 — End: 2021-01-13
  Administered 2021-01-13: 13:00:00 10.000 mL

## 2021-01-13 NOTE — Telephone Encounter
Patient had surgery today with GT and was calling to schedule post op.  CB:  [phone].

## 2021-01-13 NOTE — Telephone Encounter
Apt 9/26 with Rafaela at 2:30pm

## 2021-01-13 NOTE — Brief Op Note
[facility] HealthPatient Name: [name]        [id number] Patient DOB: 30-Sep-1950     Surgery Date: 9/19/2022Surgeon(s) and Role:   Rande Brunt, MD - PrimaryAssistant(s):Fellow: Evaristo Bury, MDResident: Daleen Squibb, MDStaff:  Circulator: Donnie Aho, RNScrub Person: Rasemus, RandyPre-Op Diagnosis: Trigger ring finger of right hand [M65.341] Procedure(s) and Anesthesia Type:   * RELEASE RIGHT RING TRIGGER FINGER - LOCALOperative Findings (enter relevant operative findings; do not refer to an operative report that is not yet transcribed): normal range of motion after release of A1 pulleySigns of infection present at the time of surgery at the operative site: None Blood and Blood Products: none                 Drains:  noneImplants: * No implants in log * Specimens: * No specimens in log * EBL: Minimal       Post Operative Diagnosis: * No post-op diagnosis entered * Telia Amundson, MD9/19/20228:52 AM

## 2021-01-13 NOTE — Discharge Instructions
Please follow-up with Maria [name] (Dr. Josem Kaufmann PA) in 2 weeks for a wound check and suture removal.You can remove your dressings tomorrow and gently wash your hand. There is glue and sutures on your incision - don't rub this area too hard, but can let water run over it. You don't need to re-wrap it, but can for comfort.Take tylenol as needed for pain (new prescription for 30 pills sent if you need it).Call our office with any additional questions or concerns.

## 2021-01-13 NOTE — Other
DATE OF PROCEDURE/SURGERY:  9-19-22OPERATION:Release trigger finger, right ring finger.OPERATOR:James Hassel Neth, M.D.ASSISTANT:Raj Antonieta Iba, M.D.ANESTHESIA:Local.PREOP DIAGNOSIS:Trigger finger, right ring finger.POSTOP DIAGNOSIS:Trigger finger,right ring finger.ESTIMATED BLOOD LOSS:N/ASPECIMENS REMOVED:N/AINDICATIONS FOR OPERATION:  This patient complained of painful locking involving the involved digit. OPERATIVE PROCEDURE:  With the patient in the supine position on the operating table, the right upper extremity was suitably prepped and draped.  The area for incision was infiltrated with a solution of 2% lidocaine and 0.5% Marcaine.  The upper extremity was exsanguinated with an Esmarch bandage.  The tourniquet was inflated to 250 mmHg.  An oblique incision was made directly over the A1 pulley of the ring finger and dissection was carried down to the flexor tendon sheath.  The A1 pulley was identified and released with a knife and then scissors were used to release the A1 pulley distally.  The A0 pulley was also released proximally.  Once the patient was asked to flex and extend the digit after both the A1 and A0 pulleys were released, there was complete active flexion with no evidence of triggering.  The tourniquet was released and the skin was closed with interrupted 5-0 nylon sutures followed by skin glue and a dry dressing.  The patient was sent home in good condition.I was present for the entire procedure.

## 2021-01-15 NOTE — Progress Notes
Pharmacist Anticoagulation Clinic Telephone Visit Follow Maria [name] ([birth year]) is on chronic anticoagulation for the diagnosis of  Lupus anticoagulant with hypercoagulable state (HC Code) (HC CODE) (HC Code)  (primary encounter diagnosis) Ischemic colitis (HC Code) with an INR goal of 2.0-3.0.  The patient was contacted regarding lab drawn INR results.  Patient's INR findings and prescribed dose from last visit:Anticoagulation Monitoring Latest Ref Rng & Units 12/20/2020 12/27/2020 01/03/2021 INR 2 - 3 - - - INR 0.91 - 1.10 - - - INR (External) 0.8 - 1.15 - - - Assoc. INR Date - 12/19/2020 12/26/2020 01/03/2021 Associated INR - 2.69 3.43 1.27 Instructions - 5 mg every day 9/2: Hold; Otherwise 2.5 mg every Mon; 5 mg all other days 2.5 mg every Mon; 5 mg all other days Last 7 day dose total - 30 mg 35 mg 27.5 mg Next INR Date - 12/26/2020 01/02/2021 01/09/2021 The following findings were reported from today's call (no findings if none selected):[x]  Missed doses []  Extra doses []  Change in medications []  Change in vitamin k rich food intake (green vegetables, herbals, etc) []  Change in alcohol use []  Change in overall health []  Recent hospitalization / emergency department visit []  Upcoming procedure (including dental procedures) []  Other Comments:       Patient was taking warfarin 2.5 mg every day instead of 5 mg daily  The following side effects were reported from today's call (no side effects if none selected):[]  Bleeding []  New bruising []  Warfarin-emergency department visit or hospital admission []  Other Comments:   No side effects Assessment/Plan:Patient's INR is 1.27, which is below goal range of 2.0-3.0. Patient reports she misunderstood and was taking warfarin 2.5 mg daily over the last week. Given that patient's INR is at 1.27 today, and she will need to start periprocedural bridging on 9/15, she was instructed to start enoxaparin 100 mg today at 6 PM, tomorrow at [facility], Sunday at 12PM, Monday at 9AM, and then continue at 9AM. This information was routed to the covering provider for review. Patient repeated back the below instructions successfully. Has ~ #20 enoxaparin syringes at home. - 9/9-9/18: Inject enoxaparin 100 mg under the skin once in the morning. Do not take any warfarin.Day of procedure:- 9/19: Do not inject any enoxaparin before your procedure. After the procedure, if approved by your doctor, take warfarin 7.5 mg at night. After procedure:- 9/20: Inject enoxaparin 100 mg in the morning and take warfarin 5 mg- 9/21: Inject enoxaparin 100 mg in the morning and take warfarin 5 mg- 9/22: Test your INR and we will call with further dosing instructions Anticoagulation Summary  As of 01/03/2021  INR goal:  2.0-3.0 TTR:  45.5 % (6.6 y) INR used for dosing:  1.27 (01/03/2021) Warfarin maintenance plan:  7.5 mg (5 mg x 1.5) every Mon, Fri; 5 mg (5 mg x 1) all other days Weekly warfarin total:  40 mg Plan last modified:  [name], PharmD (01/03/2021) Next INR check:  01/16/2021 Priority:  High Priority Target end date:  Indefinite  Indications  Lupus anticoagulant with hypercoagulable state (HC Code) (HC CODE) (HC Code) [D68.62]Ischemic colitis (HC Code) [K55.9]   Anticoagulation Episode Summary   INR check location:    Preferred lab:    Send INR reminders to:  [facility] [place] INR POOL  Comments:    Anticoagulation Care Providers   Provider Role Specialty Phone number  [name], MD Responsible Medical Oncology [phone]  [name], [place] Responsible Medical Oncology [phone]  All patient's  medications have been reviewed and updated as needed.  Electronically Signed by [name], PharmD, January 03, 2021 [facility] Anticoagulation [facility]  [address] [place], [place] [address]  Phone:[phone]  Fax: [phone]

## 2021-01-17 ENCOUNTER — Inpatient Hospital Stay: Admit: 2021-01-17 | Discharge: 2021-01-17 | Payer: PRIVATE HEALTH INSURANCE | Primary: Internal Medicine

## 2021-01-17 ENCOUNTER — Ambulatory Visit: Admit: 2021-01-17 | Payer: PRIVATE HEALTH INSURANCE | Primary: Internal Medicine

## 2021-01-17 DIAGNOSIS — Z5181 Encounter for therapeutic drug level monitoring: Secondary | ICD-10-CM

## 2021-01-17 DIAGNOSIS — K559 Vascular disorder of intestine, unspecified: Secondary | ICD-10-CM

## 2021-01-17 DIAGNOSIS — Z7901 Long term (current) use of anticoagulants: Secondary | ICD-10-CM

## 2021-01-17 DIAGNOSIS — D6862 Lupus anticoagulant syndrome: Secondary | ICD-10-CM

## 2021-01-17 LAB — PROTIME AND INR
BKR INR: 1.42 — ABNORMAL HIGH (ref 0.92–1.19)
BKR PROTHROMBIN TIME: 14.5 s — ABNORMAL HIGH (ref 9.6–12.3)

## 2021-01-17 NOTE — Progress Notes
Pharmacist Anticoagulation Clinic Telephone Visit Follow Maria [name] (1950-11-15) is on chronic anticoagulation for the diagnosis of  Lupus anticoagulant with hypercoagulable state (HC Code) (HC CODE) (HC Code)  (primary encounter diagnosis) Ischemic colitis (HC Code) with an INR goal of 2.0-3.0.  The patient was contacted regarding lab drawn INR results.  Patient's INR findings and prescribed dose from last visit:Anticoagulation Monitoring Latest Ref Rng & Units 12/27/2020 01/03/2021 01/17/2021 INR 2 - 3 - - - INR 0.91 - 1.10 - - - INR (External) 0.8 - 1.15 - - - Assoc. INR Date - 12/26/2020 01/03/2021 01/17/2021 Associated INR - 3.43 1.27 1.42 Instructions - 9/2: Hold; Otherwise 2.5 mg every Mon; 5 mg all other days 9/9: Hold; 9/10: Hold; 9/11: Hold; 9/12: Hold; 9/13: Hold; 9/14: Hold; 9/15: Hold; 9/16: Hold; 9/17: Hold; 9/18: Hold; Otherwise 7.5 mg every Mon, Fri; 5 mg all other days 7.5 mg every Fri; 5 mg all other days Last 7 day dose total - 35 mg 15 mg 22.5 mg Next INR Date - 01/02/2021 01/16/2021 01/20/2021 The following findings were reported from today's call (no findings if none selected):[]  Missed doses []  Extra doses []  Change in medications []  Change in vitamin k rich food intake (green vegetables, herbals, etc) []  Change in alcohol use []  Change in overall health []  Recent hospitalization / emergency department visit []  Upcoming procedure (including dental procedures) []  Other Comments:       No findings The following side effects were reported from today's call (no side effects if none selected):[]  Bleeding []  New bruising []  Warfarin-emergency department visit or hospital admission []  Other Comments:   No side effects Assessment/Plan:Patient's INR is 1.42, which is below goal range of 2.0-3.0.  Patient is s/p hand surgery on 9/19.  She was instructed to continue bridging with enoxaparin 100mg  every morning and taking warfarin 7.5 mg Friday and 5 mg Saturday and Sunday as indicated in the maintenance plan below.  Next INR assessment due 01/20/21. This information was routed to the covering provider for review.Anticoagulation Summary  As of 01/17/2021  INR goal:  2.0-3.0 TTR:  45.2 % (6.6 y) INR used for dosing:  1.42 (01/17/2021) Warfarin maintenance plan:  7.5 mg (5 mg x 1.5) every Fri; 5 mg (5 mg x 1) all other days Weekly warfarin total:  37.5 mg Plan last modified:  [name], [facility] (01/17/2021) Next INR check:  01/20/2021 Priority:  High Priority Target end date:  Indefinite  Indications  Lupus anticoagulant with hypercoagulable state (HC Code) (HC CODE) (HC Code) [D68.62]Ischemic colitis (HC Code) [K55.9]   Anticoagulation Episode Summary   INR check location:    Preferred lab:    Send INR reminders to:  [facility] INR POOL  Comments:    Anticoagulation Care Providers   Provider Role Specialty Phone number  [name], MD Responsible Medical Oncology [phone]  [name], [place] Responsible Medical Oncology [phone]  All patient's medications have been reviewed and updated as needed.  Electronically Signed by [name], RPH, January 17, 2021 [facility]  [address], [place], [place] [address]  [phone]  Fax: [phone]

## 2021-01-20 ENCOUNTER — Ambulatory Visit: Admit: 2021-01-20 | Payer: PRIVATE HEALTH INSURANCE | Attending: Plastic Surgery | Primary: Internal Medicine

## 2021-01-20 ENCOUNTER — Telehealth: Admit: 2021-01-20 | Payer: PRIVATE HEALTH INSURANCE | Attending: Surgery of the Hand | Primary: Internal Medicine

## 2021-01-20 ENCOUNTER — Encounter: Admit: 2021-01-20 | Payer: PRIVATE HEALTH INSURANCE | Attending: Plastic Surgery | Primary: Internal Medicine

## 2021-01-20 ENCOUNTER — Inpatient Hospital Stay: Admit: 2021-01-20 | Discharge: 2021-01-20 | Payer: PRIVATE HEALTH INSURANCE | Primary: Internal Medicine

## 2021-01-20 DIAGNOSIS — D6862 Lupus anticoagulant syndrome: Secondary | ICD-10-CM

## 2021-01-20 DIAGNOSIS — Z5181 Encounter for therapeutic drug level monitoring: Secondary | ICD-10-CM

## 2021-01-20 DIAGNOSIS — L719 Rosacea, unspecified: Secondary | ICD-10-CM

## 2021-01-20 DIAGNOSIS — Z7901 Long term (current) use of anticoagulants: Secondary | ICD-10-CM

## 2021-01-20 DIAGNOSIS — Z9889 Other specified postprocedural states: Secondary | ICD-10-CM

## 2021-01-20 DIAGNOSIS — E7800 Pure hypercholesterolemia, unspecified: Secondary | ICD-10-CM

## 2021-01-20 DIAGNOSIS — K436 Other and unspecified ventral hernia with obstruction, without gangrene: Secondary | ICD-10-CM

## 2021-01-20 DIAGNOSIS — R42 Dizziness and giddiness: Secondary | ICD-10-CM

## 2021-01-20 DIAGNOSIS — F32A Depression: Secondary | ICD-10-CM

## 2021-01-20 DIAGNOSIS — K559 Vascular disorder of intestine, unspecified: Secondary | ICD-10-CM

## 2021-01-20 DIAGNOSIS — E119 Type 2 diabetes mellitus without complications: Secondary | ICD-10-CM

## 2021-01-20 DIAGNOSIS — K55039 Acute (reversible) ischemia of large intestine, extent unspecified: Secondary | ICD-10-CM

## 2021-01-20 MED ORDER — CEPHALEXIN 500 MG CAPSULE
500 mg | ORAL_CAPSULE | Freq: Four times a day (QID) | ORAL | 1 refills | Status: AC
Start: 2021-01-20 — End: ?

## 2021-01-20 NOTE — Telephone Encounter
Per Rm Please call pt for f/u appt after antibiotic txLVM for pt to call back and schedule

## 2021-01-21 ENCOUNTER — Ambulatory Visit
Admit: 2021-01-21 | Payer: PRIVATE HEALTH INSURANCE | Attending: Pharmacist Clinician (PhC)/ Clinical Pharmacy Specialist | Primary: Internal Medicine

## 2021-01-21 ENCOUNTER — Encounter: Admit: 2021-01-21 | Payer: PRIVATE HEALTH INSURANCE | Attending: Plastic Surgery | Primary: Internal Medicine

## 2021-01-21 DIAGNOSIS — K436 Other and unspecified ventral hernia with obstruction, without gangrene: Secondary | ICD-10-CM

## 2021-01-21 DIAGNOSIS — L719 Rosacea, unspecified: Secondary | ICD-10-CM

## 2021-01-21 DIAGNOSIS — K55039 Acute (reversible) ischemia of large intestine, extent unspecified: Secondary | ICD-10-CM

## 2021-01-21 DIAGNOSIS — F32A Depression: Secondary | ICD-10-CM

## 2021-01-21 DIAGNOSIS — D6862 Lupus anticoagulant syndrome: Secondary | ICD-10-CM

## 2021-01-21 DIAGNOSIS — E119 Type 2 diabetes mellitus without complications: Secondary | ICD-10-CM

## 2021-01-21 DIAGNOSIS — E7800 Pure hypercholesterolemia, unspecified: Secondary | ICD-10-CM

## 2021-01-21 DIAGNOSIS — K559 Vascular disorder of intestine, unspecified: Secondary | ICD-10-CM

## 2021-01-21 DIAGNOSIS — R42 Dizziness and giddiness: Secondary | ICD-10-CM

## 2021-01-21 LAB — PROTIME AND INR
BKR INR: 2.58 x 1000/??L — ABNORMAL HIGH (ref 0.92–1.19)
BKR PROTHROMBIN TIME: 25.4 seconds — ABNORMAL HIGH (ref 9.6–12.3)

## 2021-01-21 NOTE — Progress Notes
Pharmacist Anticoagulation Clinic Telephone Visit Follow Maria [name] ([birth year]) is on chronic anticoagulation for the diagnosis of  Lupus anticoagulant with hypercoagulable state (HC Code) (HC CODE) (HC Code)  (primary encounter diagnosis) Ischemic colitis (HC Code) with an INR goal of 2.0-3.0.  The patient was contacted regarding lab drawn INR results.  Patient's INR findings and prescribed dose from last visit:Anticoagulation Monitoring Latest Ref Rng & Units 01/03/2021 01/17/2021 01/21/2021 INR 2 - 3 - - - INR 0.91 - 1.10 - - - INR (External) 0.8 - 1.15 - - - Assoc. INR Date - 01/03/2021 01/17/2021 01/20/2021 Associated INR - 1.27 1.42 2.58 Instructions - 9/9: Hold; 9/10: Hold; 9/11: Hold; 9/12: Hold; 9/13: Hold; 9/14: Hold; 9/15: Hold; 9/16: Hold; 9/17: Hold; 9/18: Hold; Otherwise 7.5 mg every Mon, Fri; 5 mg all other days 7.5 mg every Fri; 5 mg all other days 5 mg every day Last 7 day dose total - 15 mg 22.5 mg 37.5 mg Next INR Date - 01/16/2021 01/20/2021 01/27/2021 The following findings were reported from today's call (no findings if none selected):[]  Missed doses []  Extra doses []  Change in medications []  Change in vitamin k rich food intake (green vegetables, herbals, etc) []  Change in alcohol use []  Change in overall health []  Recent hospitalization / emergency department visit []  Upcoming procedure (including dental procedures) []  Other Comments:       Patient taking cephalexin for infection at site of stitches  The following side effects were reported from today's call (no side effects if none selected):[]  Bleeding []  New bruising []  Warfarin-emergency department visit or hospital admission []  Other Comments:   No side effects Assessment/Plan:Patient's INR is 2.58, which is within goal range of 2.0-3.0.  She was instructed to stop enoxaparin injections and to continue on warfarin 5 mg daily as indicated in the maintenance plan below. Dose is being reduced back to previous weekly dose because patient usually only needs temporary dose increase the week after procedures (see February 2022).  Next INR assessment due 1 week. This information was routed to the covering provider for review.Anticoagulation Summary  As of 01/21/2021  INR goal:  2.0-3.0 TTR:  45.2 % (6.6 y) INR used for dosing:  2.58 (01/20/2021) Warfarin maintenance plan:  5 mg (5 mg x 1) every day Weekly warfarin total:  35 mg Plan last modified:  [name], PharmD (01/21/2021) Next INR check:  01/27/2021 Priority:  High Priority Target end date:  Indefinite  Indications  Lupus anticoagulant with hypercoagulable state (HC Code) (HC CODE) (HC Code) [D68.62]Ischemic colitis (HC Code) [K55.9]   Anticoagulation Episode Summary   INR check location:    Preferred lab:    Send INR reminders to:  [facility] [place] INR POOL  Comments:    Anticoagulation Care Providers   Provider Role Specialty Phone number  [name], MD Responsible Medical Oncology [phone]  [name], [place] Responsible Medical Oncology [phone]  All patient's medications have been reviewed and updated as needed.  Electronically Signed by [name], PharmD, January 21, 2021 [facility] Anticoagulation [facility]  [address] [place], [place] [address]  Phone:[phone]  Fax: [phone]

## 2021-01-21 NOTE — Progress Notes
Plastic Surgery ClinicSeptember 26, 2022					Patient name: Maria [name] MRN:      [id number] Date of birth:   1952/09/19Date of visit:    9/26/2022Provider:	Lyrika Souders M Loralei Radcliffe, PAPOST OPERATIVE VISITDate of Surgery: 09/19/22Surgeon: Hassel Neth Procedure: right ring finger trigger release Maria [name] is now 1.5 weeks s/p above procedure.Since her surgery she has had some pain over incisionPt loss 2 sutures, and developed some redness She has some moderate stiffnessReview of Systems:Denies fever, chills, CP, SOB, cough, sore throat, peripheral edema, nausea, vomiting, rash, and tingling. Physical Exam:Alert, oriented x 3 Well developed, well nourished, NADHENT:  Normal cephalic, atraumaticNeck:  Supple, normal ROMCardiac:  Normal rate, no peripheral edemaPulmonary:  Normal breathing effort, no accessory muscle useSkin: Warm and dryPsychiatric: Normal mood and affectExam: Her sutures were  removed.  The incision appears erythematous, some tenderness to the touch Unable to make a full fist Imaging:  None today Therapy:  ROM exercises reviewed and discussedPlan and Follow-up: Maria [name] will start antibiotic therapy and ROM exercisesFollow up in 1 week for wound checkPatient will contact the office if any problems present in the interim.Candie Gintz M Mangino9/26/2022 2:34 PM

## 2021-01-27 ENCOUNTER — Ambulatory Visit: Admit: 2021-01-27 | Payer: PRIVATE HEALTH INSURANCE | Primary: Internal Medicine

## 2021-01-27 ENCOUNTER — Inpatient Hospital Stay: Admit: 2021-01-27 | Discharge: 2021-01-27 | Payer: PRIVATE HEALTH INSURANCE | Primary: Internal Medicine

## 2021-01-27 DIAGNOSIS — K559 Vascular disorder of intestine, unspecified: Secondary | ICD-10-CM

## 2021-01-27 DIAGNOSIS — Z5181 Encounter for therapeutic drug level monitoring: Secondary | ICD-10-CM

## 2021-01-27 DIAGNOSIS — Z7901 Long term (current) use of anticoagulants: Secondary | ICD-10-CM

## 2021-01-27 DIAGNOSIS — D6862 Lupus anticoagulant syndrome: Secondary | ICD-10-CM

## 2021-01-27 LAB — PROTIME AND INR
BKR INR: 3.69 x 1000/??L — ABNORMAL HIGH (ref 0.92–1.19)
BKR PROTHROMBIN TIME: 35.6 s — ABNORMAL HIGH (ref 9.6–12.3)

## 2021-01-27 NOTE — Progress Notes
Pharmacist Anticoagulation Clinic Telephone Visit Follow Maria [name] ([birth year]) is on chronic anticoagulation for the diagnosis of  Lupus anticoagulant with hypercoagulable state (HC Code) (HC CODE) (HC Code)  (primary encounter diagnosis) Ischemic colitis (HC Code) with an INR goal of 2.0-3.0.  The patient was contacted regarding lab drawn INR results.  Patient's INR findings and prescribed dose from last visit:Anticoagulation Monitoring Latest Ref Rng & Units 01/17/2021 01/21/2021 01/27/2021 INR 2 - 3 - - - INR 0.91 - 1.10 - - - INR (External) 0.8 - 1.15 - - - Assoc. INR Date - 01/17/2021 01/20/2021 01/27/2021 Associated INR - 1.42 2.58 3.69 Instructions - 7.5 mg every Fri; 5 mg all other days 5 mg every day 10/3: Hold; Otherwise 2.5 mg every Wed; 5 mg all other days Last 7 day dose total - 22.5 mg 37.5 mg 35 mg Next INR Date - 01/20/2021 01/27/2021 02/03/2021 The following findings were reported from today's call (no findings if none selected):[]  Missed doses []  Extra doses []  Change in medications []  Change in vitamin k rich food intake (green vegetables, herbals, etc) []  Change in alcohol use []  Change in overall health []  Recent hospitalization / emergency department visit []  Upcoming procedure (including dental procedures) []  Other Comments:       Patient is taking cephalexin until 10/4 The following side effects were reported from today's call (no side effects if none selected):[]  Bleeding []  New bruising []  Warfarin-emergency department visit or hospital admission []  Other Comments:   No side effects Assessment/Plan:Patient's INR is 3.69, which is above goal range of 2.0-3.0.  Patient was instructed to hold tonight's dose and take warfarin 2.5 mg on Wednesdays and 5 mg all other days (7% reduction) as indicated in the maintenance plan below.  Next INR assessment due 1 week. This information was routed to the covering provider for review.Anticoagulation Summary  As of 01/27/2021  INR goal:  2.0-3.0 TTR:  45.2 % (6.6 y) INR used for dosing:  3.69 (01/27/2021) Warfarin maintenance plan:  2.5 mg (5 mg x 0.5) every Wed; 5 mg (5 mg x 1) all other days Weekly warfarin total:  32.5 mg Plan last modified:  [name], [facility] (01/27/2021) Next INR check:  02/03/2021 Priority:  High Priority Target end date:  Indefinite  Indications  Lupus anticoagulant with hypercoagulable state (HC Code) (HC CODE) (HC Code) [D68.62]Ischemic colitis (HC Code) [K55.9]   Anticoagulation Episode Summary   INR check location:    Preferred lab:    Send INR reminders to:  [facility] [place] INR POOL  Comments:    Anticoagulation Care Providers   Provider Role Specialty Phone number  [name], MD Responsible Medical Oncology [phone]  [name], [place] Responsible Medical Oncology [phone]  All patient's medications have been reviewed and updated as needed.  Electronically Signed by [name], RPH, January 27, 2021 [facility] Anticoagulation Clinic  [address] [place], [place] [address]  Phone:[phone]  Fax: [phone]

## 2021-01-29 ENCOUNTER — Encounter: Admit: 2021-01-29 | Payer: PRIVATE HEALTH INSURANCE | Attending: Plastic Surgery | Primary: Internal Medicine

## 2021-02-05 ENCOUNTER — Ambulatory Visit: Admit: 2021-02-05 | Payer: PRIVATE HEALTH INSURANCE | Primary: Internal Medicine

## 2021-02-05 ENCOUNTER — Inpatient Hospital Stay: Admit: 2021-02-05 | Discharge: 2021-02-05 | Payer: PRIVATE HEALTH INSURANCE | Primary: Internal Medicine

## 2021-02-05 DIAGNOSIS — D6862 Lupus anticoagulant syndrome: Secondary | ICD-10-CM

## 2021-02-05 DIAGNOSIS — Z7901 Long term (current) use of anticoagulants: Secondary | ICD-10-CM

## 2021-02-05 DIAGNOSIS — K559 Vascular disorder of intestine, unspecified: Secondary | ICD-10-CM

## 2021-02-05 DIAGNOSIS — Z5181 Encounter for therapeutic drug level monitoring: Secondary | ICD-10-CM

## 2021-02-05 LAB — PROTIME AND INR
BKR INR: 1.36 — ABNORMAL HIGH (ref 0.92–1.19)
BKR PROTHROMBIN TIME: 13.9 seconds — ABNORMAL HIGH (ref 9.6–12.3)

## 2021-02-05 NOTE — Progress Notes
Pharmacist Anticoagulation Clinic Telephone Visit Follow Maria [name] (1950-08-07) is on chronic anticoagulation for the diagnosis of  Lupus anticoagulant with hypercoagulable state (HC Code) (HC CODE) (HC Code)  (primary encounter diagnosis) Ischemic colitis (HC Code) with an INR goal of 2.0-3.0.  The patient was contacted regarding lab drawn INR results.  Patient's INR findings and prescribed dose from last visit:Anticoagulation Monitoring Latest Ref Rng & Units 01/21/2021 01/27/2021 02/05/2021 INR 2 - 3 - - - INR 0.91 - 1.10 - - - INR (External) 0.8 - 1.15 - - - Assoc. INR Date - 01/20/2021 01/27/2021 02/05/2021 Associated INR - 2.58 3.69 1.36 Instructions - 5 mg every day 10/3: Hold; Otherwise 2.5 mg every Wed; 5 mg all other days 10/12: 10 mg; Otherwise 5 mg every day Last 7 day dose total - 37.5 mg 35 mg 32.5 mg Next INR Date - 01/27/2021 02/03/2021 - The following findings were reported from today's call (no findings if none selected):[]  Missed doses []  Extra doses [x]  Change in medications []  Change in vitamin k rich food intake (green vegetables, herbals, etc) []  Change in alcohol use [x]  Change in overall health []  Recent hospitalization / emergency department visit []  Upcoming procedure (including dental procedures) []  Other Comments:       Patient has been very sick for the past week and has taken Tylenol, diphenhydramine and cough medicine. Covid test came back negative The following side effects were reported from today's call (no side effects if none selected):[]  Bleeding []  New bruising []  Warfarin-emergency department visit or hospital admission []  Other Comments:   No side effects Assessment/Plan:Patient's INR is 1.36, which is below goal range of 2.0-3.0.  Patient has been very sick for the past week and has taken Tylenol, diphenhydramine and cough medicine. Covid test came back negative.  She was instructed to take 10 mg warfarin tonight and 5 mg all other days as indicated in the maintenance plan below.  Next INR assessment due 02/10/21. This information was routed to the covering provider for review.Anticoagulation Summary  As of 02/05/2021  INR goal:  2.0-3.0 TTR:  45.1 % (6.7 y) INR used for dosing:  1.36 (02/05/2021) Warfarin maintenance plan:  5 mg (5 mg x 1) every day Weekly warfarin total:  35 mg Plan last modified:  [name], [facility] (02/05/2021) Next INR check:   Priority:  High Priority Target end date:  Indefinite  Indications  Lupus anticoagulant with hypercoagulable state (HC Code) (HC CODE) (HC Code) [D68.62]Ischemic colitis (HC Code) [K55.9]   Anticoagulation Episode Summary   INR check location:    Preferred lab:    Send INR reminders to:  [facility]  [place] INR POOL  Comments:    Anticoagulation Care Providers   Provider Role Specialty Phone number  [name], MD Responsible Medical Oncology [phone]  [name], [place] Responsible Medical Oncology [phone]  All patient's medications have been reviewed and updated as needed.  Electronically Signed by [name], RPH, February 05, 2021 [facility] Anticoagulation Clinic  [address], [place], [place] [address]  Phone:[phone]  Fax: [phone]

## 2021-02-10 ENCOUNTER — Ambulatory Visit: Admit: 2021-02-10 | Payer: PRIVATE HEALTH INSURANCE | Attending: Neuromuscular Medicine | Primary: Internal Medicine

## 2021-02-12 ENCOUNTER — Encounter: Admit: 2021-02-12 | Payer: PRIVATE HEALTH INSURANCE | Attending: Plastic Surgery | Primary: Internal Medicine

## 2021-02-13 ENCOUNTER — Telehealth: Admit: 2021-02-13 | Payer: PRIVATE HEALTH INSURANCE | Primary: Internal Medicine

## 2021-02-13 NOTE — Telephone Encounter
PHARMACY NOTE:  ANTICOAG LAB SCHEDULING 	I have called [name] to remind patient of overdue INR. Patient reports she will get INR drawn when she is feeling better. She had to go back to the urgent care and is on her second round of antibiotics. She is going to try to get her INR checked on Monday if she is feeling better. Wynelle Fanny, CPHT10/20/20223:30 PM

## 2021-02-18 ENCOUNTER — Ambulatory Visit: Admit: 2021-02-18 | Payer: PRIVATE HEALTH INSURANCE | Attending: Foot & Ankle Surgery | Primary: Internal Medicine

## 2021-02-25 ENCOUNTER — Encounter: Admit: 2021-02-25 | Payer: PRIVATE HEALTH INSURANCE | Attending: Ophthalmology | Primary: Internal Medicine

## 2021-02-25 ENCOUNTER — Encounter: Admit: 2021-02-25 | Payer: PRIVATE HEALTH INSURANCE | Attending: Medical Oncology | Primary: Internal Medicine

## 2021-02-26 ENCOUNTER — Ambulatory Visit: Admit: 2021-02-26 | Payer: PRIVATE HEALTH INSURANCE | Primary: Internal Medicine

## 2021-02-26 ENCOUNTER — Inpatient Hospital Stay: Admit: 2021-02-26 | Discharge: 2021-02-26 | Payer: PRIVATE HEALTH INSURANCE | Primary: Internal Medicine

## 2021-02-26 DIAGNOSIS — D6862 Lupus anticoagulant syndrome: Secondary | ICD-10-CM

## 2021-02-26 DIAGNOSIS — Z7901 Long term (current) use of anticoagulants: Secondary | ICD-10-CM

## 2021-02-26 DIAGNOSIS — K559 Vascular disorder of intestine, unspecified: Secondary | ICD-10-CM

## 2021-02-26 DIAGNOSIS — Z5181 Encounter for therapeutic drug level monitoring: Secondary | ICD-10-CM

## 2021-02-26 LAB — PROTIME AND INR
BKR INR: 2.6 — ABNORMAL HIGH (ref 0.92–1.19)
BKR PROTHROMBIN TIME: 25.6 seconds — ABNORMAL HIGH (ref 9.6–12.3)

## 2021-02-26 NOTE — Progress Notes
Pharmacist Anticoagulation Clinic Telephone Visit Follow Maria [name] (21-Jun-1950) is on chronic anticoagulation for the diagnosis of  Lupus anticoagulant with hypercoagulable state (HC Code) (HC CODE) (HC Code)  (primary encounter diagnosis) Ischemic colitis (HC Code) with an INR goal of 2.0-3.0.  The patient was contacted regarding lab drawn INR results.  Patient's INR findings and prescribed dose from last visit:Anticoagulation Monitoring Latest Ref Rng & Units 01/27/2021 02/05/2021 02/26/2021 INR 2 - 3 - - - INR 0.91 - 1.10 - - - INR (External) 0.8 - 1.15 - - - Assoc. INR Date - 01/27/2021 02/05/2021 02/26/2021 Associated INR - 3.69 1.36 2.60 Instructions - 10/3: Hold; Otherwise 2.5 mg every Wed; 5 mg all other days 10/12: 10 mg; Otherwise 5 mg every day 5 mg every day Last 7 day dose total - 35 mg 32.5 mg 35 mg Next INR Date - 02/03/2021 02/10/2021 03/12/2021 The following findings were reported from today's call (no findings if none selected):[]  Missed doses []  Extra doses []  Change in medications []  Change in vitamin k rich food intake (green vegetables, herbals, etc) []  Change in alcohol use []  Change in overall health []  Recent hospitalization / emergency department visit []  Upcoming procedure (including dental procedures) []  Other Comments:       No findings The following side effects were reported from today's call (no side effects if none selected):[]  Bleeding []  New bruising []  Warfarin-emergency department visit or hospital admission []  Other Comments:   No side effects Assessment/Plan:Patient's INR is 2.6, which is within goal range of 2.0-3.0.  She was instructed to continue current weekly dose as indicated in the maintenance plan below.  Next INR assessment due 2 weeks.  Anticoagulation Summary  As of 02/26/2021  INR goal:  2.0-3.0 TTR:  45.2 % (6.7 y) INR used for dosing:  2.60 (02/26/2021) Warfarin maintenance plan: 5 mg (5 mg x 1) every day Weekly warfarin total:  35 mg Plan last modified:  [name], [facility] (02/05/2021) Next INR check:  03/12/2021 Priority:  High Priority Target end date:  Indefinite  Indications  Lupus anticoagulant with hypercoagulable state (HC Code) (HC CODE) (HC Code) [D68.62]Ischemic colitis (HC Code) [K55.9]   Anticoagulation Episode Summary   INR check location:    Preferred lab:    Send INR reminders to:  [facility] [place] INR POOL  Comments:    Anticoagulation Care Providers   Provider Role Specialty Phone number  [name], MD Responsible Medical Oncology [phone]  [name], [place] Responsible Medical Oncology [phone]  All patient's medications have been reviewed and updated as needed.  Electronically Signed by [name], RPH, February 26, 2021 [facility] Anticoagulation Clinic  [address] [place], [place] [address]  Phone:[phone]  Fax: [phone]

## 2021-03-04 ENCOUNTER — Emergency Department: Admit: 2021-03-04 | Payer: PRIVATE HEALTH INSURANCE | Primary: Internal Medicine

## 2021-03-04 ENCOUNTER — Inpatient Hospital Stay
Admit: 2021-03-04 | Discharge: 2021-03-07 | Payer: PRIVATE HEALTH INSURANCE | Source: Home / Self Care | Attending: Surgery | Admitting: Surgery

## 2021-03-04 DIAGNOSIS — K56609 Unspecified intestinal obstruction, unspecified as to partial versus complete obstruction: Secondary | ICD-10-CM

## 2021-03-04 MED ORDER — ONDANSETRON 4 MG DISINTEGRATING TABLET
4 mg | Freq: Once | ORAL | Status: CP
Start: 2021-03-04 — End: ?
  Administered 2021-03-05: 03:00:00 4 mg via ORAL

## 2021-03-04 MED ORDER — MORPHINE 4 MG/ML INTRAVENOUS SOLUTION
4 mg/mL | Freq: Once | INTRAVENOUS | Status: CP
Start: 2021-03-04 — End: ?
  Administered 2021-03-05: 03:00:00 4 mL via INTRAVENOUS

## 2021-03-04 MED ORDER — MORPHINE 4 MG/ML INTRAVENOUS SOLUTION
4 mg/mL | Freq: Once | INTRAVENOUS | Status: CP
Start: 2021-03-04 — End: ?
  Administered 2021-03-05: 05:00:00 4 mL via INTRAVENOUS

## 2021-03-04 MED ORDER — SODIUM CHLORIDE 0.9 % BOLUS (NEW BAG)
0.9 % | Freq: Once | INTRAVENOUS | Status: CP
Start: 2021-03-04 — End: ?
  Administered 2021-03-05: 03:00:00 0.9 mL/h via INTRAVENOUS

## 2021-03-04 MED ORDER — DIPHENHYDRAMINE 50 MG/ML INJECTION (WRAPPED E-RX)
50 mg/mL | Freq: Once | INTRAVENOUS | Status: CP
Start: 2021-03-04 — End: ?
  Administered 2021-03-05: 03:00:00 50 mL via INTRAVENOUS

## 2021-03-04 MED ORDER — DIPHENHYDRAMINE 50 MG/ML INJECTION (WRAPPED E-RX)
50 mg/mL | Freq: Once | INTRAVENOUS | Status: CP
Start: 2021-03-04 — End: ?
  Administered 2021-03-05: 05:00:00 50 mL via INTRAVENOUS

## 2021-03-05 ENCOUNTER — Emergency Department: Admit: 2021-03-05 | Payer: PRIVATE HEALTH INSURANCE | Primary: Internal Medicine

## 2021-03-05 DIAGNOSIS — K56609 Unspecified intestinal obstruction, unspecified as to partial versus complete obstruction: Secondary | ICD-10-CM

## 2021-03-05 LAB — CBC WITH AUTO DIFFERENTIAL
BKR WAM ABSOLUTE IMMATURE GRANULOCYTES.: 0.05 x 1000/ÂµL (ref 0.00–0.30)
BKR WAM ABSOLUTE LYMPHOCYTE COUNT.: 1.82 x 1000/ÂµL (ref 0.60–3.70)
BKR WAM ABSOLUTE NRBC (2 DEC): 0 x 1000/ÂµL (ref 0.00–1.00)
BKR WAM ANALYZER ANC: 11.91 x 1000/ÂµL — ABNORMAL HIGH (ref 2.00–7.60)
BKR WAM BASOPHIL ABSOLUTE COUNT.: 0.06 x 1000/ÂµL (ref 0.00–1.00)
BKR WAM BASOPHILS: 0.4 % (ref 0.0–1.4)
BKR WAM EOSINOPHIL ABSOLUTE COUNT.: 0.14 x 1000/ÂµL (ref 0.00–1.00)
BKR WAM EOSINOPHILS: 1 % (ref 0.0–5.0)
BKR WAM HEMATOCRIT (2 DEC): 43.5 % (ref 35.00–45.00)
BKR WAM HEMOGLOBIN: 14.6 g/dL (ref 11.7–15.5)
BKR WAM IMMATURE GRANULOCYTES: 0.3 % (ref 0.0–1.0)
BKR WAM LYMPHOCYTES: 12.4 % — ABNORMAL LOW (ref 17.0–50.0)
BKR WAM MCH (PG): 27.1 pg (ref 27.0–33.0)
BKR WAM MCHC: 33.6 g/dL (ref 31.0–36.0)
BKR WAM MCV: 80.9 fL (ref 80.0–100.0)
BKR WAM MONOCYTE ABSOLUTE COUNT.: 0.71 x 1000/ÂµL (ref 0.00–1.00)
BKR WAM MONOCYTES: 4.8 % (ref 4.0–12.0)
BKR WAM MPV: 11.3 fL (ref 8.0–12.0)
BKR WAM NEUTROPHILS: 81.1 % — ABNORMAL HIGH (ref 39.0–72.0)
BKR WAM NUCLEATED RED BLOOD CELLS: 0 % (ref 0.0–1.0)
BKR WAM PLATELETS: 350 x1000/ÂµL (ref 150–420)
BKR WAM RDW-CV: 15.1 % — ABNORMAL HIGH (ref 11.0–15.0)
BKR WAM RED BLOOD CELL COUNT.: 5.38 M/ÂµL (ref 4.00–6.00)
BKR WAM WHITE BLOOD CELL COUNT: 14.7 x1000/ÂµL — ABNORMAL HIGH (ref 4.0–11.0)

## 2021-03-05 LAB — HEPATIC FUNCTION PANEL
BKR A/G RATIO: 1.1 (ref 1.0–2.2)
BKR ALANINE AMINOTRANSFERASE (ALT): 29 U/L (ref 10–35)
BKR ALBUMIN: 4.2 g/dL (ref 3.6–4.9)
BKR ALKALINE PHOSPHATASE: 163 U/L — ABNORMAL HIGH (ref 9–122)
BKR ASPARTATE AMINOTRANSFERASE (AST): 22 U/L (ref 10–35)
BKR AST/ALT RATIO: 0.8
BKR BILIRUBIN DIRECT: <0.2 mg/dL (ref <=0.3)
BKR BILIRUBIN TOTAL: 0.2 mg/dL (ref <=1.2)
BKR GLOBULIN: 3.8 g/dL — ABNORMAL HIGH (ref 2.3–3.5)
BKR PROTEIN TOTAL: 8 g/dL (ref 6.6–8.7)

## 2021-03-05 LAB — BASIC METABOLIC PANEL
BKR ANION GAP: 13 (ref 7–17)
BKR BLOOD UREA NITROGEN: 16 mg/dL (ref 8–23)
BKR BUN / CREAT RATIO: 20 (ref 8.0–23.0)
BKR CALCIUM: 9.7 mg/dL (ref 8.8–10.2)
BKR CHLORIDE: 98 mmol/L (ref 98–107)
BKR CO2: 27 mmol/L (ref 20–30)
BKR CREATININE: 0.8 mg/dL (ref 0.40–1.30)
BKR EGFR, CREATININE (CKD-EPI 2021): >60 mL/min/{1.73_m2} (ref >=60)
BKR GLUCOSE: 214 mg/dL — ABNORMAL HIGH (ref 70–100)
BKR POTASSIUM: 4.6 mmol/L (ref 3.3–5.3)
BKR SODIUM: 138 mmol/L (ref 136–144)

## 2021-03-05 LAB — URINALYSIS WITH CULTURE REFLEX      (BH LMW YH)
BKR BILIRUBIN, UA: NEGATIVE
BKR BLOOD, UA: NEGATIVE
BKR NITRITE, UA: NEGATIVE
BKR PH, UA: 7 (ref 5.5–7.5)
BKR PROTEIN, UA: NEGATIVE
BKR SPECIFIC GRAVITY, UA: 1.023 (ref 1.005–1.030)
BKR UROBILINOGEN, UA (MG/DL): <2 mg/dL (ref <=2.0)

## 2021-03-05 LAB — TROPONIN T HIGH SENSITIVITY, 0 HOUR BASELINE WITH REFLEX (BH GH LMW YH): BKR TROPONIN T HS 0 HOUR BASELINE: <6 ng/L

## 2021-03-05 LAB — URINE MICROSCOPIC     (BH GH LMW YH)
BKR RBC/HPF INSTRUMENT: 2 /HPF (ref 0–2)
BKR URINE SQUAMOUS EPITHELIAL CELLS, UA (NUMERIC): 4 /HPF (ref 0–5)
BKR WBC/HPF INSTRUMENT: 4 /HPF (ref 0–5)

## 2021-03-05 LAB — PROTIME AND INR
BKR INR: 2.74 — ABNORMAL HIGH (ref 0.89–1.15)
BKR PROTHROMBIN TIME: 27.4 s — ABNORMAL HIGH (ref 9.5–12.1)

## 2021-03-05 LAB — LACTIC ACID, PLASMA: BKR LACTATE: 0.7 mmol/L (ref 0.5–2.2)

## 2021-03-05 LAB — UA REFLEX CULTURE

## 2021-03-05 LAB — SARS COV-2 (COVID-19) RNA: BKR SARS-COV-2 RNA (COVID-19) (YH): NEGATIVE

## 2021-03-05 LAB — LIPASE: BKR LIPASE: 28 U/L (ref 11–55)

## 2021-03-05 MED ORDER — NON-FORMULARY/RESTRICTED MEDICATION REQUEST
Freq: Once | Status: DC
Start: 2021-03-05 — End: 2021-03-05

## 2021-03-05 MED ORDER — DEXTROSE 40 % ORAL GEL
40 % | ORAL | Status: DC | PRN
Start: 2021-03-05 — End: 2021-03-08

## 2021-03-05 MED ORDER — FRUIT JUICE
ORAL | Status: DC | PRN
Start: 2021-03-05 — End: 2021-03-08

## 2021-03-05 MED ORDER — HYDROMORPHONE 1 MG/ML INJECTION SYRINGE
1 mg/mL | SUBCUTANEOUS | Status: DC | PRN
Start: 2021-03-05 — End: 2021-03-06

## 2021-03-05 MED ORDER — HYDROMORPHONE 0.5 MG/0.5 ML INJECTION SYRINGE
0.5 mg/ mL | SUBCUTANEOUS | Status: DC | PRN
Start: 2021-03-05 — End: 2021-03-06

## 2021-03-05 MED ORDER — SODIUM CHLORIDE 0.9 % LARGE VOLUME SYRINGE FOR AUTOINJECTOR
Freq: Once | INTRAVENOUS | Status: CP | PRN
Start: 2021-03-05 — End: ?
  Administered 2021-03-05: 06:00:00 via INTRAVENOUS

## 2021-03-05 MED ORDER — SODIUM CHLORIDE 0.9 % INTRAVENOUS SOLUTION
INTRAVENOUS | Status: DC
Start: 2021-03-05 — End: 2021-03-07
  Administered 2021-03-05 – 2021-03-06 (×4): via INTRAVENOUS

## 2021-03-05 MED ORDER — MORPHINE 2 MG/ML INJECTION SYRINGE
2 mg/mL | INTRAVENOUS | Status: DC | PRN
Start: 2021-03-05 — End: 2021-03-08

## 2021-03-05 MED ORDER — SODIUM CHLORIDE 0.9 % (FLUSH) INJECTION SYRINGE
0.9 % | INTRAVENOUS | Status: DC | PRN
Start: 2021-03-05 — End: 2021-03-08

## 2021-03-05 MED ORDER — MORPHINE 4 MG/ML INTRAVENOUS SOLUTION
4 mg/mL | Freq: Once | INTRAVENOUS | Status: CP
Start: 2021-03-05 — End: ?
  Administered 2021-03-05: 08:00:00 4 mL via INTRAVENOUS

## 2021-03-05 MED ORDER — WARFARIN 2.5 MG TABLET
2.5 mg | Freq: Every day | ORAL | Status: DC
Start: 2021-03-05 — End: 2021-03-08

## 2021-03-05 MED ORDER — SODIUM CHLORIDE 0.9 % (FLUSH) INJECTION SYRINGE
0.9 % | Freq: Three times a day (TID) | INTRAVENOUS | Status: DC
Start: 2021-03-05 — End: 2021-03-08

## 2021-03-05 MED ORDER — ACETAMINOPHEN 1,000 MG/100 ML (10 MG/ML) INTRAVENOUS SOLUTION
10 mg/mL | Freq: Three times a day (TID) | INTRAVENOUS | Status: CP
Start: 2021-03-05 — End: ?
  Administered 2021-03-05 – 2021-03-06 (×3): 10 mL/h via INTRAVENOUS

## 2021-03-05 MED ORDER — DEXTROSE 10 % IV BOLUS FOR ORDERABLE
INTRAVENOUS | Status: DC | PRN
Start: 2021-03-05 — End: 2021-03-08

## 2021-03-05 MED ORDER — VENLAFAXINE ER 150 MG CAPSULE,EXTENDED RELEASE 24 HR
150 mg | Freq: Every day | ORAL | Status: DC
Start: 2021-03-05 — End: 2021-03-08
  Administered 2021-03-06 – 2021-03-07 (×2): 150 mg via ORAL

## 2021-03-05 MED ORDER — GLUCAGON 1 MG/ML IN STERILE WATER
Freq: Once | INTRAMUSCULAR | Status: DC | PRN
Start: 2021-03-05 — End: 2021-03-08

## 2021-03-05 MED ORDER — GABAPENTIN 100 MG CAPSULE
100 mg | Freq: Every evening | ORAL | Status: DC
Start: 2021-03-05 — End: 2021-03-08
  Administered 2021-03-07: 02:00:00 100 mg via ORAL

## 2021-03-05 MED ORDER — PRAVASTATIN 20 MG TABLET
20 mg | Freq: Every evening | ORAL | Status: DC
Start: 2021-03-05 — End: 2021-03-08
  Administered 2021-03-07: 02:00:00 20 mg via ORAL

## 2021-03-05 MED ORDER — INSULIN U-100 REGULAR HUMAN 100 UNIT/ML (SLIDING SCALE)
100 unit/mL | Freq: Four times a day (QID) | SUBCUTANEOUS | Status: DC
Start: 2021-03-05 — End: 2021-03-06

## 2021-03-05 MED ORDER — MORPHINE 4 MG/ML INTRAVENOUS SOLUTION
4 mg/mL | INTRAVENOUS | Status: DC | PRN
Start: 2021-03-05 — End: 2021-03-08

## 2021-03-05 MED ORDER — IOHEXOL 350 MG IODINE/ML INTRAVENOUS SOLUTION
350 mg iodine/mL | Freq: Once | INTRAVENOUS | Status: CP | PRN
Start: 2021-03-05 — End: ?
  Administered 2021-03-05: 06:00:00 350 mL via INTRAVENOUS

## 2021-03-05 MED ORDER — SKIM MILK
ORAL | Status: DC | PRN
Start: 2021-03-05 — End: 2021-03-08

## 2021-03-05 MED ORDER — INSULIN LISPRO 100 UNIT/ML (SLIDING SCALE)
100 unit/mL | Freq: Three times a day (TID) | SUBCUTANEOUS | Status: DC
Start: 2021-03-05 — End: 2021-03-08
  Administered 2021-03-07 (×2): 100 unit/mL via SUBCUTANEOUS

## 2021-03-05 NOTE — Plan of Care
Plan of Care Overview/ Patient Status    Admission Note Nursing Maria [name] is a 70 y.o. female admitted with a chief complaint of SBO. Patient arrived from [facility] ED to MAIN 6.Patient is  A+Ox4. Vitals:  03/05/21 0602 03/05/21 0926 03/05/21 1210 03/05/21 1609 BP: (!) 145/77 134/75 120/63 115/68 Pulse: 82 85 80 74 Resp: 16 18 18 18  Temp: 98 ?F (36.7 ?C) 98.3 ?F (36.8 ?C) 97.8 ?F (36.6 ?C) 98 ?F (36.7 ?C) TempSrc: Oral Oral Oral Oral SpO2: 94% 99% 96% 94% Weight:  68 kg   Height:  4' 11 (1.499 m)   Comments:VSS on RA. Lung sounds clear, equal b/l. Abd soft, tender, nondistended. BS audible. + flatus. IVF per [facility]. Pt NPO. No N/V. Good UO. Pt amb IND in room. Mild abd pain reported, sch IV Tylenol for pain w/ good effect. Pt resting in bed at this time. Safety maintained.See flowsheets, patient education and plan of care for additional information. Electronically Signed by Estrella Deeds, RN, March 05, 2021

## 2021-03-05 NOTE — ED Notes
10:10 PM Pt arrives to ED c/o severe abdominal pain and vomiting x 3 hours. Pt reports h/o SBO multiple times in the past. Denies CP, SOB, fever/chills. A&O x 4. VSS on RA. Pt guarding her abdomen, tearful. EKG obtained. PIV placed, labs sent. Pt medicated with IV morphine, IV Benadryl, and ODT zofran with improvement in symptoms. 500 mL NS bolus administered. Pt swabbed for covid-19. Pending imaging.11:51 PMTransport came to take pt to [place], pt requesting more pain meds first. MD notified. Additional IV benadryl and morphine ordered and administered. 12:30 AMPt to [place].3:28 AMPt medicated for pain. VSS on RA. Pending surgery consult.4:10 AMCG4 obtained. BG 183. Pt resting comfortably.6:45 AMAdditional labs sent. BG 162. Pt resting comfortably at this time. NS infusing @ 100 mL/hr. Plan for admission.

## 2021-03-05 NOTE — ED Provider Notes
HistoryChief Complaint Patient presents with ? Abdominal Pain   To ED for periumbilical abdominal pain that started at 6pm today. LBM today. Vomiting in triage. Hx of frequent SBOs. Diaphoretic. Afebrile.   The history is provided by the patient. OtherThis is a new problem. The current episode started 3 to 5 hours ago. The problem occurs constantly. The problem has not changed since onset.Associated symptoms include abdominal pain. Pertinent negatives include no chest pain, no headaches and no shortness of breath. Nothing aggravates the symptoms. Nothing relieves the symptoms. She has tried nothing for the symptoms. The treatment provided no relief.  Past Medical History: Diagnosis Date ? Acute ischemic colitis (HC Code) (HC CODE) (HC Code) 2015 ? Depression  ? Diabetes mellitus (HC Code) (HC CODE) (HC Code)  ? Hypercholesteremia  ? Lupus anticoagulant disorder (HC Code) (HC CODE) (HC Code)  ? Rosacea  ? Ventral hernia with bowel obstruction  ? Vertigo  Past Surgical History: Procedure Laterality Date ? ABDOMINAL ADHESION SURGERY  2016 ? BLEPHAROPLASTY Bilateral 05/2016 ? COLOSTOMY  05/27/13 ? COLOSTOMY CLOSURE   ? HYSTEROSCOPY W/ POLYPECTOMY  06/18/2020 ? KNEE LIGAMENT RECONSTRUCTION Right 2004 ? LAPAROSCOPIC NISSEN FUNDOPLICATION    2000 ? ? SINUS SURGERY   ? THYROID BIOPSY   ? TUBAL LIGATION   ? UMBILICAL HERNIA REPAIR   ? VENTRAL HERNIA REPAIR   Family History Problem Relation Age of Onset ? Bladder cancer Mother  ? Colon cancer Paternal Aunt  ? Colon cancer Cousin       died of colona cancer at age 59 ? Breast cancer Cousin       2 cousins with breast cancer ? Colon cancer Father  Social History Socioeconomic History ? Marital status: Married Tobacco Use ? Smoking status: Never Smoker ? Smokeless tobacco: Never Used Vaping Use ? Vaping Use: Never used Substance and Sexual Activity ? Alcohol use: Not Currently ? Drug use: No ? Sexual activity: Yes   Partners: Male ED Other Social History ? E-cigarette status Never User  ? E-Cigarette Use Never User  E-cigarette/Vaping Substances E-cigarette/Vaping Devices Review of Systems Constitutional: Negative for activity change, appetite change and fever. HENT: Negative for congestion, rhinorrhea and sinus pressure.  Respiratory: Negative for cough, shortness of breath and wheezing.  Cardiovascular: Negative for chest pain and palpitations. Gastrointestinal: Positive for abdominal pain, nausea and vomiting. Negative for abdominal distention and diarrhea. Endocrine: Negative for polydipsia and polyuria. Genitourinary: Negative for dysuria. Musculoskeletal: Negative for arthralgias, back pain and myalgias. Skin: Negative for rash. Neurological: Negative for headaches. Psychiatric/Behavioral: Negative for agitation and behavioral problems.  Physical ExamED Triage Vitals [03/04/21 2117]BP: (!) 165/88Pulse: (!) 100Pulse from  O2 sat: n/aResp: (!) 24Temp: 97.7 ?F (36.5 ?C)Temp src: OralSpO2: 98 % BP (!) 165/88  - Pulse (!) 100  - Temp 97.7 ?F (36.5 ?C) (Oral)  - Resp (!) 24  - Wt 68 kg  - SpO2 98%  - BMI 30.30 kg/m? Physical ExamConstitutional:     General: She is not in acute distress.   Appearance: She is well-developed. HENT:    Head: Normocephalic and atraumatic. Eyes:    Pupils: Pupils are equal, round, and reactive to light. Cardiovascular:    Rate and Rhythm: Normal rate and regular rhythm.    Heart sounds: No murmur heard.  No friction rub. No gallop. Pulmonary:    Effort: Pulmonary effort is normal. No respiratory distress.    Breath sounds: Normal breath sounds. No wheezing or rales. Abdominal:    General: There is no  distension.    Palpations: Abdomen is soft.    Tenderness: There is generalized abdominal tenderness. There is no rebound. Musculoskeletal:    Cervical back: Normal range of motion. Skin:   General: Skin is warm and dry. Neurological:    Mental Status: She is alert and oriented to person, place, and time. Psychiatric:       Behavior: Behavior normal.  ProceduresProcedures ED COURSEPatient Reevaluation: 70 y/o F with a history of DM, HLD, previous ischemic colitis and subsequent SBOs presented to the ED complaining of generalized abdominal pain and nausea starting suddenly at 6:30pm.  Patient reports that symptoms feel very similar to previous SBOsOn PE patient is in no acute distress with reassuring vital signs.  Breath sounds are CTA b/l and patient has a regular, mildly tachycardic rhythm.  Abdomen is soft with diffuse tenderness most prominent in the lower abdomen, no rebound or guarding.Impression: SBO vs. Pancreatitis vs. Diverticulitis vs. UTI vs. Ischemic colitis vs. Appendicitis-labs-[place] A/P-IVF-symptomatic treatment-Patient signed out to Dr. Derrell Lolling pending imaging.===================================Patient was signed out to me pending imaging. [place] showed SBO. Surgery eval and admit to surgical service. Rush Farmer, MDComments as of 03/05/21 0828 Tue Mar 04, 2021 2304 Pt signed out to me, abd pain, n/v. Possiblre SBO, [place] pending.  [CI] 2350 WBC(!): 14.7 [CI] 2356 EKG noted to be slightly different from prior, given this will also add troponins.  Pending [place]. [CI] Wed Mar 05, 2021 0118 [place] Abdomen Pelvis with IV Contrast without oral (BMI>25)SBO suspected, will wait radiology read for possible transition point in the left lower quadrant. [CI] [id number] Surgery consulted [CI] 0435 Lactate, POC(!): 0.4 [CI]  Comments User Index[CI] Rush Farmer, MD   Clinical Impressions as of 03/05/21 0828 SBO (small bowel obstruction) (HC Code) [facility] CODE) [facility] Code)  ED DispositionAdmit Trey Sailors, DO11/09/22 0044 Trey Sailors, DO11/09/22 0136 Rush Farmer, MD11/09/22 [phone]

## 2021-03-05 NOTE — ED Notes
7:30 AM Assumed care from outgoing nurse, pt is a+o x4, denies any apin. Surgery team is at bedside evaluating the patient. Pending NGtube placement. 8:45 AM Ofirmev IV pending verification from Pharmacy. MD okd pt to go to the assigned room prior to NG tube placement.

## 2021-03-05 NOTE — ED Triage Note
Provider Triage Note  70 y.o. year old female  presents with severe acute onset lower abdominal pain x 3 hours, hx multiple recurrent SBO, last 2.5 months ago. Last BM earlier today. One episode of emesis.   Physical Exam: Appears uncomfortable, actively dry heaving in triage. Orders placed in triage: yes  Disposition: Main ED for further evaluation This initial provider screening is meant to expedite workup for patients and is not meant as definitive care. Please refer to subsequent notes for full medical evaluation of the patient.

## 2021-03-05 NOTE — Utilization Review (ED)
UM Status: Managed Medicare IP, determined inpatient.  SBO

## 2021-03-05 NOTE — Plan of Care
[facility]		Spiritual Care NotePurpose: Referral Source: Patient Observation: People present/Information Obtained From: Patient Emotional Mood: Sleepy Subjective: I responded to patient request for a visit. Ms Nouri was awake, but appeared sleepy when I visited. Introduced self and role. She acknowledged that she was just about to fall asleep and said another time would be better. Informed her of chaplain availability. Spiritual Assessment:Referral Source: Patient Information Obtained From: Patient Mood: Sleepy  Spiritual Interventions:Spiritual Intervention Index**Date of Spiritual Visit: 11/09/22Visit Type: ConsultLanguage or special accommodation rendered?: NoIntervention Type: Spiritual VisitResponding Chaplain: Unit ChaplainConsulted With: Nurse, PCA/PCTSpiritual/Religious Support Provided: Patient Declined Chaplain VisitOutcome:  OUTCOMES: Progressed Emotionally or Physically: Increased comfort  OUTCOMES: Spiritual/Emotional Resources Declined or Incomplete : Declined chaplain support  Plan: Will try to follow up soon.Total Consult Time: 9 minutes	Signed: Beaulah Dinning, MDivMHB: [phone] chaplain available 24/7:SRC: [phone]: [phone] 11/9/20221:26 PM

## 2021-03-05 NOTE — Other
[facility]           Surgical Consult H&PConsult to: Tacy Dura, MDConsult from: Rush Farmer, MDAdmit Date: 11/8/2022HPI:Maria [name] is a very pleasant 70 y.o. 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 Surgery has been consulted for SBO management. Briefly, patient reports current episode of lower abdominal pain started around 6pm last evening and has progressively been getting worse, patient was passing gas until yesterday and LMB was yesterday around the same time pain started. Patient reports one episode of emesis and continues to have mild nausea. Patient denies fevers or chills, denies SOB or CP, denies night sweats or weight loss. Reports she was not able to attend a FU appointment with Dr. Bufford Spikes today. On evaluation patient HDS, afebrile, reports improved pain control with administration of morphine in the ED, reports mild nausea at the moment, no emesis in the ED, on exam abdomen is soft, mildly distended, mildly tender to deep palpation, no rebound, no guarding. WU remarkable for WBC 14.7, HnH 14/43, Cr 0.8, glucose 214, POC lactate 0.4, electrolytes WNL, AP 163, rest of LFTs WNL. [place] AP showing recurrent SBO with transition point in ventral abdomen, minimal mesenteric edema and lower ventral abdominal hernia containing loop of non obstructed bowel. ZOX:[id number] items listed on HPIPMH:She  has a past medical history of Acute ischemic colitis (HC Code) (HC CODE) (HC Code) (2015), Depression, Diabetes mellitus (HC Code) (HC CODE) (HC Code), Hypercholesteremia, Lupus anticoagulant disorder (HC Code) (HC CODE) (HC Code), Rosacea, Ventral hernia with bowel obstruction, and Vertigo.PSH:She  has a past surgical history that includes Knee ligament reconstruction (Right, 2004); Laparoscopic Nissen fundoplication; Umbilical hernia repair; Ventral hernia repair; Tubal ligation; THYROID BIOPSY; Colostomy (05/27/13); Sinus surgery; Abdominal adhesion surgery (2016); Blepharoplasty (Bilateral, 05/2016); Colostomy closure; and Hysteroscopy w/ polypectomy (06/18/2020).WJ:XBJYNW History Socioeconomic History ? Marital status: Married   Spouse name: Not on file ? Number of children: Not on file ? Years of education: Not on file ? Highest education level: Not on file Occupational History ? Not on file Tobacco Use ? Smoking status: Never Smoker ? Smokeless tobacco: Never Used Vaping Use ? Vaping Use: Never used Substance and Sexual Activity ? Alcohol use: Not Currently ? Drug use: No ? Sexual activity: Yes   Partners: Male Other Topics Concern ? Not on file Social History Narrative ? Not on file Social Determinants of Health Financial Resource Strain: Not on file Food Insecurity: Not on file Transportation Needs: Not on file Physical Activity: Not on file Stress: Not on file Social Connections: Not on file Intimate Partner Violence: Not on file Housing Stability: Not on file MEDICATIONS: Prior to Admission medications  Medication Sig Start Date End Date Taking? Authorizing Provider acetaminophen (TYLENOL) 500 mg tablet Take 1 tablet (500 mg total) by mouth every 4 (four) hours as needed. 01/13/21   Mujadzic, Muamer, MD biotin 1 mg tablet Take 1 tablet by mouth daily.     Provider, Historical cholecalciferol, vitamin  D3, 125 mcg (5,000 unit) tablet Take 1 tablet by mouth every other day..    Provider, Historical diphenhydramine HCl (BENADRYL ALLERGY ORAL) Take 1 tablet by mouth as needed.    Provider, Historical enoxaparin (LOVENOX) 100 mg/mL syringe Inject 1 mL (100 mg total) under the skin daily. 01/09/21   [name], MD gabapentin (NEURONTIN) 100 mg capsule Take 100 mg by mouth at bedtime.    Provider, Historical JARDIANCE 25 mg tablet 1 TABLET BY MOUTH ONCE DAILY IN THE MORNING FOR 30 DAYS 01/15/20   Provider, Historical meclizine (ANTIVERT) 12.5 mg tablet Take 1 tablet (12.5 mg total) by mouth daily as needed for Dizziness 07/12/17   Jacqualyn Posey, MD melatonin 10 mg Tab Take 10 mg by mouth nightly as needed.     Provider, Historical multivitamin tablet Take 1 tablet by mouth daily.    Provider, Historical olopatadine (PATADAY) 0.2 % ophthalmic solution Place 1 drop into both eyes daily. 10/24/20   Hollice Espy, MD omega-3 fatty acids 1,000 mg capsule Take 2 g by mouth daily.Patient not taking: No sig reported    Provider, Historical ondansetron (ZOFRAN) 4 MG tablet Take 4 mg by mouth every 8 (eight) hours as needed for Nausea.    Provider, Historical phytonadione, vit K1, (VITAMIN K) 100 mcg tablet Take 100 mcg by mouth daily.    Provider, Historical polyethylene glycol (MIRALAX) 17 gram packet Take 1 packet (17 g total) by mouth daily. Mix in 8 ounces of water, juice, soda, coffee or tea prior to taking. 07/18/18   Peter Garter, MD prasterone, dhea,-calcium carb (DHEA) 10 mg-47 mg calcium Tab Take by mouth.    Provider, Historical pravastatin (PRAVACHOL) 40 MG tablet Take 60 mg by mouth nightly.     Provider, Historical senna (SENOKOT) 8.6 mg tablet Take 2 tablets by mouth nightly.. 08/11/16   Lupe Carney, APRN sitaGLIPtin (JANUVIA) 100 MG tablet Take 100 mg by mouth daily..    Provider, Historical traMADoL (ULTRAM) 50 mg tablet Take 1 tablet (50 mg total) by mouth every 4 (four) hours as needed for pain for up to 7 doses.Patient not taking: No sig reported 10/27/20   Elwanda Brooklyn, PA venlafaxine (EFFEXOR XR) 150 mg XR 24 hr extended release capsule Take 150 mg by mouth daily.  06/30/16   Provider, Historical warfarin (COUMADIN) 5 mg tablet Take 1 tablet (5 mg total) by mouth daily. Or as directed. 12/06/20   [name], MD  ALLERGIES: She  is allergic to morphine; biaxin  [clarithromycin]; and latex, natural rubber.OBJECTIVEVitals:Temp:  [97.7 ?F (36.5 ?C)-97.8 ?F (36.6 ?C)] 97.8 ?F (36.6 ?C)Pulse:  [90-100] 90Resp:  [18-24] 18BP: (158-165)/(86-88) 158/86SpO2:  [98 %-99 %] 99 %Device (Oxygen Therapy): room airI/O's:No intake/output data recorded.Physical Exam:Gen: NAD, A&Ox4HEENT: NCAT, EOMICV: Regular rate, hemodynamically stablePulm: Normal work of breathing on room airAbd: Prior colostomy scar and mid line incision site well healed, soft, mildly distended, mildly tender to deep palpation on inferior midline, midline hernia palpated at this site, easy reducible, no rebound, no guardingExt: WWP Labs:Recent Labs Lab 11/08/222142 WBC 14.7* HGB 14.6 HCT 43.50 PLT 350 Recent Labs Lab 11/08/222142 NA 138 K 4.6 CL 98 CO2 27 BUN 16 CREATININE 0.80 GLU 214* CALCIUM 9.7 Recent Labs Lab 11/08/222142 ALT 29 AST 22 ALKPHOS 163* BILITOT 0.2 BILIDIR <0.2 LIPASE 28 Recent Labs Lab 11/02/221250 INR 2.60* Diagnostics:[place] Abdomen Pelvis with IV Contrast without oral (BMI>25)Result Date: 11/9/2022Recurrent small bowel obstruction with zone of transition in the ventral abdomen, likely due to adhesions from hernia repair.  Findings were discussed with Dr. Derrell Lolling at 03/05/2021 2:35 AM. Report Initiated By:  Sheppard Evens, MD Reported And Signed By: Mikki Santee, MD  [facility] Radiology and Biomedical Imaging  ASSESSMENT:Maria [name] is an 70 y.o. 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 Patient HDS, afebrile, reports improved pain control with administration of morphine in the ED, reports mild nausea at the moment, no emesis in the ED, on exam abdomen is soft, mildly distended, mildly tender to deep palpation on inferior midline, midline hernia palpated at this site, easy reducible, no rebound, no guarding. WU remarkable for WBC 14.7, HnH 14/43, Cr 0.8, glucose 214, POC lactate 0.4, electrolytes WNL, AP 163, rest of LFTs WNL. [place] AP showing recurrent SBO with transition point in ventral abdomen, minimal mesenteric edema and lower ventral abdominal hernia containing loop of non obstructed bowel. Patient would benefit from admission for non operative management of SBO. RECOMMENDATIONS:- Admit to General Surgery under Dr. Deniece Portela- NPO- mIVF, will use NS given hyperglycemia- Will place NGT- Pain control with morphine SS- HDISS- Will FU INR and then decide on therapeutic anticoagulation, if INR <1.5 consider therapeutic lovenox - ARBF- Consider SBFT later today if no bowel functionSeen and discussed with Dr. Chipper Herb (Chief) who discussed it with Dr. Deniece Portela (Attending)Further recommendations to follow from attendingRECOMMENDATIONS ARE NOT FINAL UNTIL ATTENDING HAS SIGNED NOTE.Signed: Diego Jearld Pies, MD PGY-2 [place] HB: [phone]/09/22 Patient with recurrent SBOHistory as aboveHas had several SBOs and multiple operationsReports now that there is decreased painafeb VSSabd soft and mild discomfort Wbc 14CT recurrent SBO with point of transition just above the ventral hernia in low abdomenA/P recurrent SBO  _ hopefully will be able to get the SBO to resolve and then Maria [name] will need LOAAdmitNGTIV resuscitationPain control

## 2021-03-06 ENCOUNTER — Inpatient Hospital Stay: Admit: 2021-03-06 | Payer: PRIVATE HEALTH INSURANCE

## 2021-03-06 DIAGNOSIS — K56609 Unspecified intestinal obstruction, unspecified as to partial versus complete obstruction: Secondary | ICD-10-CM

## 2021-03-06 LAB — CBC WITH AUTO DIFFERENTIAL
BKR WAM ABSOLUTE IMMATURE GRANULOCYTES.: 0.01 x 1000/ÂµL (ref 0.00–0.30)
BKR WAM ABSOLUTE LYMPHOCYTE COUNT.: 2.07 x 1000/ÂµL (ref 0.60–3.70)
BKR WAM ABSOLUTE NRBC (2 DEC): 0 x 1000/ÂµL (ref 0.00–1.00)
BKR WAM ANALYZER ANC: 5.02 x 1000/ÂµL (ref 2.00–7.60)
BKR WAM BASOPHIL ABSOLUTE COUNT.: 0.03 x 1000/ÂµL (ref 0.00–1.00)
BKR WAM BASOPHILS: 0.4 % (ref 0.0–1.4)
BKR WAM EOSINOPHIL ABSOLUTE COUNT.: 0.19 x 1000/ÂµL (ref 0.00–1.00)
BKR WAM EOSINOPHILS: 2.4 % (ref 0.0–5.0)
BKR WAM HEMATOCRIT (2 DEC): 39.5 % (ref 35.00–45.00)
BKR WAM HEMOGLOBIN: 12.2 g/dL (ref 11.7–15.5)
BKR WAM IMMATURE GRANULOCYTES: 0.1 % (ref 0.0–1.0)
BKR WAM LYMPHOCYTES: 26.5 % (ref 17.0–50.0)
BKR WAM MCH (PG): 25.7 pg — ABNORMAL LOW (ref 27.0–33.0)
BKR WAM MCHC: 30.9 g/dL — ABNORMAL LOW (ref 31.0–36.0)
BKR WAM MCV: 83.3 fL (ref 80.0–100.0)
BKR WAM MONOCYTE ABSOLUTE COUNT.: 0.48 x 1000/ÂµL (ref 0.00–1.00)
BKR WAM MONOCYTES: 6.2 % (ref 4.0–12.0)
BKR WAM MPV: 11.3 fL (ref 8.0–12.0)
BKR WAM NEUTROPHILS: 64.4 % (ref 39.0–72.0)
BKR WAM NUCLEATED RED BLOOD CELLS: 0 % (ref 0.0–1.0)
BKR WAM PLATELETS: 261 x1000/ÂµL (ref 150–420)
BKR WAM RDW-CV: 15 % (ref 11.0–15.0)
BKR WAM RED BLOOD CELL COUNT.: 4.74 M/ÂµL (ref 4.00–6.00)
BKR WAM WHITE BLOOD CELL COUNT: 7.8 x1000/ÂµL (ref 4.0–11.0)

## 2021-03-06 LAB — MAGNESIUM: BKR MAGNESIUM: 2 mg/dL (ref 1.7–2.4)

## 2021-03-06 LAB — BASIC METABOLIC PANEL
BKR ANION GAP: 11 (ref 7–17)
BKR BLOOD UREA NITROGEN: 12 mg/dL (ref 8–23)
BKR BUN / CREAT RATIO: 17.1 (ref 8.0–23.0)
BKR CALCIUM: 8.4 mg/dL — ABNORMAL LOW (ref 8.8–10.2)
BKR CHLORIDE: 103 mmol/L (ref 98–107)
BKR CO2: 21 mmol/L (ref 20–30)
BKR CREATININE: 0.7 mg/dL (ref 0.40–1.30)
BKR EGFR, CREATININE (CKD-EPI 2021): >60 mL/min/{1.73_m2} (ref >=60)
BKR GLUCOSE: 85 mg/dL (ref 70–100)
BKR POTASSIUM: 4 mmol/L (ref 3.3–5.3)
BKR SODIUM: 135 mmol/L — ABNORMAL LOW (ref 136–144)

## 2021-03-06 LAB — PROTIME AND INR
BKR INR: 1.89 — ABNORMAL HIGH (ref 0.89–1.15)
BKR PROTHROMBIN TIME: 19.3 s — ABNORMAL HIGH (ref 9.5–12.1)

## 2021-03-06 LAB — URINE CULTURE

## 2021-03-06 LAB — PHOSPHORUS     (BH GH L LMW YH): BKR PHOSPHORUS: 2.8 mg/dL (ref 2.2–4.5)

## 2021-03-06 MED ORDER — HEPARIN (PORCINE) 5,000 UNIT/ML INJECTION SOLUTION
5000 unit/mL | Freq: Three times a day (TID) | SUBCUTANEOUS | Status: DC
Start: 2021-03-06 — End: 2021-03-06

## 2021-03-06 MED ORDER — MELATONIN 3 MG TABLET
3 mg | Freq: Every evening | ORAL | Status: DC | PRN
Start: 2021-03-06 — End: 2021-03-08

## 2021-03-06 MED ORDER — ENOXAPARIN 100 MG/ML SUBCUTANEOUS SYRINGE
100 mg/mL | SUBCUTANEOUS | Status: DC
Start: 2021-03-06 — End: 2021-03-08
  Administered 2021-03-06 – 2021-03-07 (×2): 100 mL via SUBCUTANEOUS

## 2021-03-06 MED ORDER — ONDANSETRON HCL (PF) 4 MG/2 ML INJECTION SOLUTION
4 mg/2 mL | Freq: Four times a day (QID) | INTRAVENOUS | Status: DC | PRN
Start: 2021-03-06 — End: 2021-03-08
  Administered 2021-03-06: 23:00:00 4 mL via INTRAVENOUS

## 2021-03-06 NOTE — Progress Notes
Pt feels better with no nausea, less abd pain.  +flatus.avssabd soft, nontender, slt distenSBO resolvingStart clearsCheck INR daily; lovenox if <2

## 2021-03-06 NOTE — Plan of Care
Problem: Adult Inpatient Plan of CareGoal: Readiness for Transition of Care11/01/2021 4098 by Barbie Haggis, RNOutcome: Interventions implemented as appropriate Plan of Care Overview/ Patient Status    Pt is a 70 y.o. 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 If YES to any question below then proceed to CM Eval/Plan  Is there a change in their cognitive function No Do you anticipate a change in this patient's physicial function that will effect discharge needs? No Has there been a readmission within the last 30 days No Were there services prior to admission ( Examples: Assisted Living, HD, Homecare, Extended Care Facility, Methadone, SNF, Outpatient Infusion Center) No Negative/Positive Screen Negative Screening: Case Management department will follow patient's progress and discuss plan of care with treatment team. Case Manager Attestation  I have reviewed the medical record and completed the above screen. CM staff will follow patient's progress and discuss the plan of care with the Treatment Team. Yes  Chart reviewed and I met with pt today and reviewed my role with discharge planning.  Pt states that pta she  lives with her husband in a multi-level home.  She states that she is indep at home and ambulates with a cane prn, +drives.  Pt is not active with any homecare agency nor does she feel that she will need any services upon discharge.  Husband will transport to home upon d/c.  Assessment screening completed. Continue to follow patient's progress and discuss plan of care with treatment team.  No discharge needs identified at this time. Care Management will continue to follow with team.Maria [name] Mylinda Latina, RN, BSN, ACM-RN Office [phone]  [phone]

## 2021-03-06 NOTE — Progress Notes
General SurgeryProgress Note Service: General SurgeryAdmit Date: 11/8/2022Attending Provider: Tacy Dura, MD   Admission History: Maria [name] is a 70 y.o. 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), with an extensive surgical hisotry including ex lap with an extended left colectomy and creation of colostomy, stage 2 hartman's procedure with extension LOA, and enterotomoy repair, ventral hernia repair with mesh with recurrent SBO who presented on 03/04/2021 with a recurrence of SBOInterval Events: -Pain controlled on current regimen-On NPO-+ flatus, - BMs- OOB, ambulating - PT 27.4 yesterday-Denies fever/chills, nausea, vomiting, chest pain, sob, Review Of Current Medications: Scheduled Meds:Current Facility-Administered Medications Medication Dose Route Frequency Provider Last Rate Last Admin ? gabapentin (NEURONTIN) capsule 100 mg  100 mg Oral Nightly Gardner Candle, PA     ? insulin lispro (Admelog, HumaLOG) Sliding Scale (See admin instructions for dose) 1-18 Units  1-18 Units Subcutaneous TID AC Bowker, Fransico Michael, PA     ? insulin regular human (HumuLIN R, NovoLIN R) Sliding Scale (See admin instructions for dose) 1-18 Units  1-18 Units Subcutaneous Q6H Jearld Pies, [place], MD     ? [facility] by provider] pravastatin (PRAVACHOL) tablet 60 mg  60 mg Oral Nightly Gardner Candle, PA     ? sodium chloride 0.9 % flush 3 mL  3 mL IV Push Q8H Jearld Pies, [place], MD     ? venlafaxine XR (EFFEXOR XR) 24 hr capsule 150 mg  150 mg Oral Daily Gardner Candle, PA     ? [Held by provider] warfarin (COUMADIN) tablet 5 mg  5 mg Oral Daily Gardner Candle, PA     Continuous Infusions:? sodium chloride 100 mL/hr (03/06/21 0250) PRN Meds:.dextrose (GLUCOSE) 40 % gel 15 g **OR** fruit juice **OR** skim milk, dextrose (GLUCOSE) 40 % gel 30 g **OR** fruit juice, dextrose injection, dextrose injection, glucagon, morphine (ADULT), morphine (ADULT), sodium chlorideReview Of Current Medications: Allergies Allergen Reactions ? Morphine Hives and Itching   Pt tolerated morphine during admission 08/08/16-08/09/16.  Pt premedicated with diphenhydramine ? Biaxin  [Clarithromycin]  ? Latex, Natural Rubber Rash Vital Signs and I/O: Vital signs:Temp:  [97.5 ?F (36.4 ?C)-98.3 ?F (36.8 ?C)] 97.5 ?F (36.4 ?C)Pulse:  [70-85] 70Resp:  [16-18] 16BP: (115-139)/(63-75) 127/75SpO2:  [94 %-100 %] 97 %Device (Oxygen Therapy): room airI/O Last 3 Shifts:I/O last 3 completed shifts:In: - Out: 1100 [Urine:1100]Physical Exam: General: AO, comfortably lying on bedHEENT: ATNCPulmonary: Symmetrical chest expansion, B/L equal breath sounds, No crepitus/crackles/rhonchi Heart: RRR, No pericardial rub or murmurAbdomen: Soft, mildly distended, and tender, tympaniticReview Of Labs: Electrolytes:Lab Results Component Value Date  NA 135 (L) 03/06/2021  K 4.0 03/06/2021  CL 103 03/06/2021  CO2 21 03/06/2021  CREATININE 0.70 03/06/2021  BUN 12 03/06/2021  GLU 79 03/06/2021  CALCIUM 8.4 (L) 03/06/2021  MG 2.0 03/06/2021  PHOS 2.8 03/06/2021 CBC:Lab Results Component Value Date  WBC 7.8 03/06/2021  HGB 12.2 03/06/2021  HCT 39.50 03/06/2021  PLT 261 03/06/2021 PT/INR/PTT:Lab Results Component Value Date  INR 2.74 (H) 03/05/2021  PTT 47.6 (H) 10/02/2020 LFTs:Lab Results Component Value Date  BILITOT 0.2 03/04/2021  BILIDIR <0.2 03/04/2021  AST 22 03/04/2021  ALT 29 03/04/2021  ALKPHOS 163 (H) 03/04/2021  AMYLASE 41 08/22/2014  LIPASE 28 03/04/2021 Microbiology:Lab Results Component Value Date  LABBLOO  06/06/2013   No growth after 5 days of incubation.All Blood Culture Bottles with growth will be reported  LABBLOO  05/29/2013   No growth after 5 days of incubation.All Blood Culture Bottles with growth will be reported  LABBLOO  05/29/2013   No growth after 5 days of incubation.All Blood Culture Bottles with growth will be reported  LABURIN  06/16/2016   Less than 10,000 CFU/mL. Clinical significance is unlikely for organism(s) present in quantities of less than 10,000 CFU/mL.  LABURIN Less than 10,000 cfu/mL 06/04/2013  LABURIN No Growth 05/29/2013  LABURIN No Growth 05/27/2013  LOWERRESPIRA 2+ Normal Flora 05/29/2013 Imaging results in the last 24 hours: No results found.Assessment/ Plan: Maria [name] is a 70 y.o. female now   with an extensive surgical Hx as detailed above with multiple episodes of SBO who presented on 03/04/2021 with a recurrent SBO, awaiting return of the bowel function- Advance to CLD- Pain adequately controlled with analgesics, Continue pain regimen- Continue PPx-     Continue SCD, SQ Hep q8h- F/U labs with PT/INR- AM labs- OOBA as tolerated, Ambulatex4- Monitor vitals q4hr- Continue home meds as ordered - Dispo: FloorPlease see attending addendum for corrections and final plan.Signed:[name] Wan[place] Chamberlain, MBBS11/10/227:29 AMAttending Comments:

## 2021-03-06 NOTE — Plan of Care
Plan of Care Overview/ Patient Status    Pt A&Ox4. VSS. RA. NPO.(Ice chips allowed). Denies sob, chest pain, n/v/d. No respiratory distress noted. C/o mild pain but controlled w controlled IV Tylenol. + flatus. Pt independent in the room. Up to the toilet. Voiding spontaneously. NS @100  ml/hr. Meds per MAR. POC reviewed w pt who verbalized understanding. Safety maintained. Call bell within reach. Will continue to follow POC. Faustino Luecke da Bernerd Pho, RN

## 2021-03-07 DIAGNOSIS — R221 Localized swelling, mass and lump, neck: Secondary | ICD-10-CM

## 2021-03-07 DIAGNOSIS — Z6830 Body mass index (BMI) 30.0-30.9, adult: Secondary | ICD-10-CM

## 2021-03-07 DIAGNOSIS — Z7984 Long term (current) use of oral hypoglycemic drugs: Secondary | ICD-10-CM

## 2021-03-07 DIAGNOSIS — E669 Obesity, unspecified: Secondary | ICD-10-CM

## 2021-03-07 DIAGNOSIS — E7800 Pure hypercholesterolemia, unspecified: Secondary | ICD-10-CM

## 2021-03-07 DIAGNOSIS — D6862 Lupus anticoagulant syndrome: Secondary | ICD-10-CM

## 2021-03-07 DIAGNOSIS — Z79899 Other long term (current) drug therapy: Secondary | ICD-10-CM

## 2021-03-07 DIAGNOSIS — K565 Intestinal adhesions [bands], unspecified as to partial versus complete obstruction: Secondary | ICD-10-CM

## 2021-03-07 DIAGNOSIS — F32A Depression, unspecified: Secondary | ICD-10-CM

## 2021-03-07 DIAGNOSIS — Z885 Allergy status to narcotic agent status: Secondary | ICD-10-CM

## 2021-03-07 DIAGNOSIS — E1165 Type 2 diabetes mellitus with hyperglycemia: Secondary | ICD-10-CM

## 2021-03-07 DIAGNOSIS — K439 Ventral hernia without obstruction or gangrene: Secondary | ICD-10-CM

## 2021-03-07 DIAGNOSIS — Z7901 Long term (current) use of anticoagulants: Secondary | ICD-10-CM

## 2021-03-07 DIAGNOSIS — Z91040 Latex allergy status: Secondary | ICD-10-CM

## 2021-03-07 LAB — CBC WITH AUTO DIFFERENTIAL
BKR WAM ABSOLUTE IMMATURE GRANULOCYTES.: 0.03 x 1000/ÂµL (ref 0.00–0.30)
BKR WAM ABSOLUTE LYMPHOCYTE COUNT.: 1.67 x 1000/ÂµL (ref 0.60–3.70)
BKR WAM ABSOLUTE NRBC (2 DEC): 0 x 1000/ÂµL (ref 0.00–1.00)
BKR WAM ANALYZER ANC: 6.48 x 1000/ÂµL (ref 2.00–7.60)
BKR WAM BASOPHIL ABSOLUTE COUNT.: 0.03 x 1000/ÂµL (ref 0.00–1.00)
BKR WAM BASOPHILS: 0.3 % (ref 0.0–1.4)
BKR WAM EOSINOPHIL ABSOLUTE COUNT.: 0.15 x 1000/ÂµL (ref 0.00–1.00)
BKR WAM EOSINOPHILS: 1.7 % (ref 0.0–5.0)
BKR WAM HEMATOCRIT (2 DEC): 39.2 % (ref 35.00–45.00)
BKR WAM HEMOGLOBIN: 12.7 g/dL (ref 11.7–15.5)
BKR WAM IMMATURE GRANULOCYTES: 0.3 % (ref 0.0–1.0)
BKR WAM LYMPHOCYTES: 18.8 % (ref 17.0–50.0)
BKR WAM MCH (PG): 26.2 pg — ABNORMAL LOW (ref 27.0–33.0)
BKR WAM MCHC: 32.4 g/dL (ref 31.0–36.0)
BKR WAM MCV: 80.8 fL (ref 80.0–100.0)
BKR WAM MONOCYTE ABSOLUTE COUNT.: 0.51 x 1000/ÂµL (ref 0.00–1.00)
BKR WAM MONOCYTES: 5.7 % (ref 4.0–12.0)
BKR WAM MPV: 11.4 fL (ref 8.0–12.0)
BKR WAM NEUTROPHILS: 73.2 % — ABNORMAL HIGH (ref 39.0–72.0)
BKR WAM NUCLEATED RED BLOOD CELLS: 0 % (ref 0.0–1.0)
BKR WAM PLATELETS: 281 x1000/ÂµL (ref 150–420)
BKR WAM RDW-CV: 14.7 % (ref 11.0–15.0)
BKR WAM RED BLOOD CELL COUNT.: 4.85 M/ÂµL (ref 4.00–6.00)
BKR WAM WHITE BLOOD CELL COUNT: 8.9 x1000/ÂµL (ref 4.0–11.0)

## 2021-03-07 LAB — BASIC METABOLIC PANEL
BKR ANION GAP: 11 (ref 7–17)
BKR BLOOD UREA NITROGEN: 10 mg/dL (ref 8–23)
BKR BUN / CREAT RATIO: 14.3 (ref 8.0–23.0)
BKR CALCIUM: 9.1 mg/dL (ref 8.8–10.2)
BKR CHLORIDE: 100 mmol/L (ref 98–107)
BKR CO2: 24 mmol/L (ref 20–30)
BKR CREATININE: 0.7 mg/dL (ref 0.40–1.30)
BKR EGFR, CREATININE (CKD-EPI 2021): >60 mL/min/{1.73_m2} (ref >=60)
BKR GLUCOSE: 82 mg/dL (ref 70–100)
BKR POTASSIUM: 4 mmol/L (ref 3.3–5.3)
BKR SODIUM: 135 mmol/L — ABNORMAL LOW (ref 136–144)

## 2021-03-07 LAB — PHOSPHORUS     (BH GH L LMW YH): BKR PHOSPHORUS: 3 mg/dL (ref 2.2–4.5)

## 2021-03-07 LAB — MAGNESIUM: BKR MAGNESIUM: 1.9 mg/dL (ref 1.7–2.4)

## 2021-03-07 LAB — PROTIME AND INR
BKR INR: 1.48 — ABNORMAL HIGH (ref 0.89–1.15)
BKR PROTHROMBIN TIME: 15.3 s — ABNORMAL HIGH (ref 9.5–12.1)

## 2021-03-07 MED ORDER — ENOXAPARIN 100 MG/ML SUBCUTANEOUS SYRINGE
100 mg/mL | Freq: Every day | SUBCUTANEOUS | 1 refills | 7.00 days | Status: AC
Start: 2021-03-07 — End: 2021-10-22

## 2021-03-07 MED ORDER — ENOXAPARIN 100 MG/ML SUBCUTANEOUS SYRINGE
100 mg/mL | Freq: Every day | SUBCUTANEOUS | 1 refills | 7.00 days | Status: AC
Start: 2021-03-07 — End: 2021-03-07
  Filled 2021-03-07: qty 7, 7d supply, fill #1
  Filled 2021-03-07: qty 7, 7d supply, fill #0

## 2021-03-07 NOTE — Plan of Care
Plan of Care Overview/ Patient Status    A+Ox4. VSS on RA. Lung sounds clear, equal b/l. Abd soft, nontender, nondistended. BS normo x4. +flatus, no BM. Cl liq diet w/ good intake. No N/V. Good UO. No pain reported. Amb IND in room. IVF rate reduced per orders to 50 mL/hr. Pt resting in bed at this time. Safety maintained.1702: Blood sugar check 69, recheck 71. 120 mL cranberry juice given. Recheck at 1728- 95. 1835- Blood sugar check 112, oncoming RN aware. Electronically Signed by Estrella Deeds, RN, March 06, 2021

## 2021-03-07 NOTE — Plan of Care
Plan of Care Overview/ Patient Status    Pt A&Ox4. VSS. RA. CLD tolerated. Denies sob, chest pain, n/v/d. No respiratory distress noted. Denies abd pain/discomfort. Edema noted on neck (US done).  + flatus. Pt independent in the room. Up to the toilet. Voiding spontaneously. NS @50  ml/hr. Meds per MAR. POC reviewed w pt who verbalized understanding. Safety maintained. Call bell within reach. Will continue to follow POC. Aarya Quebedeaux da Bernerd Pho, RN

## 2021-03-07 NOTE — Plan of Care
Maria [name] was discharged via Private Car accompanied by Spouse.  Verbalized understanding of discharge instructionsand recommended follow up care as per the after visit summary.  Written discharge instructions provided. Denies any further questions. Vital signs    Vitals:  03/07/21 0434 03/07/21 0801 03/07/21 1126 03/07/21 1420 BP: 136/74 102/67 128/81 (!) 166/78 Pulse: 83 81 (!) 91 (!) 106 Resp: 18 18 18 18  Temp: 98.2 ?F (36.8 ?C) 98.3 ?F (36.8 ?C) 97.8 ?F (36.6 ?C) 98.4 ?F (36.9 ?C) TempSrc: Oral Oral Oral Oral SpO2: 98% 100% 97% 95% Weight:     Height:     Patient confirmed all belongings returned. Belongings charted in last 7 days: Valuable(s) : Cell Phone; Purse (03/05/2021  9:26 AM) Plan of Care Overview/ Patient Status    Assumed care of patient at 0700.  Alert and oriented x 4.  VSS on RA, no c/o respiratory distress.  No c/o chest pain.  Abdomen soft, non-tender, +BS.  Advanced to low fiber diet this AM, tolerating well, no n/v.  Passing flatus.  +BM this AM.  Voiding spontaneously in bathroom.  Skin intact.  No c/o pain.  Verbal and written discharge instructions provided regarding meds, diet, and follow-up care.  Patient verbalized understanding.  Questions encouraged.

## 2021-03-07 NOTE — Progress Notes
General SurgeryProgress Note Service: General SurgeryAdmit Date: 11/8/2022Attending Provider: Tacy Dura, MD   Admission History: Maria [name] is a 70 y.o. 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), with an extensive surgical hisotry including ex lap with an extended left colectomy and creation of colostomy, stage 2 hartman's procedure with extension LOA, and enterotomoy repair, ventral hernia repair with mesh with recurrent SBO who presented on 03/04/2021 with a recurrence of SBOInterval Events: - Pain controlled on current regimen- Tolerating clears- +flatus, -BMs- Therapeutic Lvx started yesterday- PT/INR 15.3/1.48 from 19.3/1.89- OOB, ambulating - Denies fever/chills, nausea, vomiting, chest pain, sob, Review Of Current Medications: Scheduled Meds:Current Facility-Administered Medications Medication Dose Route Frequency Provider Last Rate Last Admin ? enoxaparin (LOVENOX) injection 100 mg  100 mg Subcutaneous Q24H Maria Copp, PA   100 mg at 03/06/21 1304 ? gabapentin (NEURONTIN) capsule 100 mg  100 mg Oral Nightly Maria Candle, PA   100 mg at 03/06/21 2056 ? insulin lispro (Admelog, HumaLOG) Sliding Scale (See admin instructions for dose) 1-18 Units  1-18 Units Subcutaneous TID Maria [name], Maria Michael, PA     ? pravastatin (PRAVACHOL) tablet 60 mg  60 mg Oral Nightly Maria Candle, PA   60 mg at 03/06/21 2057 ? sodium chloride 0.9 % flush 3 mL  3 mL IV Push Q8H Maria [name], [place], MD     ? venlafaxine XR (EFFEXOR XR) 24 hr capsule 150 mg  150 mg Oral Daily Maria Candle, PA   150 mg at 03/06/21 0825 ? [Held by provider] warfarin (COUMADIN) tablet 5 mg  5 mg Oral Daily Maria Candle, PA     Continuous Infusions:? sodium chloride 50 mL/hr (03/06/21 1813) PRN Meds:.dextrose (GLUCOSE) 40 % gel 15 g **OR** fruit juice **OR** skim milk, dextrose (GLUCOSE) 40 % gel 30 g **OR** fruit juice, dextrose injection, dextrose injection, glucagon, melatonin, morphine (ADULT), morphine (ADULT), ondansetron (ZOFRAN) IV Push, sodium chlorideReview Of Current Medications: Allergies Allergen Reactions ? Morphine Hives and Itching   Pt tolerated morphine during admission 08/08/16-08/09/16.  Pt premedicated with diphenhydramine ? Biaxin  [Clarithromycin]  ? Latex, Natural Rubber Rash Vital Signs and I/O: Vital signs:Temp:  [97.4 ?F (36.3 ?C)-98.2 ?F (36.8 ?C)] 98.2 ?F (36.8 ?C)Pulse:  [59-86] 83Resp:  [16-18] 18BP: (124-156)/(68-81) 136/74SpO2:  [97 %-100 %] 98 %Device (Oxygen Therapy): room airI/O Last 3 Shifts:I/O last 3 completed shifts:In: - Out: 1100 [Urine:1100]Physical Exam: General: AO, comfortably lying on bedHEENT: ATNCPulmonary: Symmetrical chest expansion, B/L equal breath sounds, No crepitus/crackles/rhonchi Heart: RRR, No pericardial rub or murmurAbdomen: Soft, mildly distended, and non tender, mildly tympaniticReview Of Labs: Electrolytes:Lab Results Component Value Date  NA 135 (L) 03/06/2021  K 4.0 03/06/2021  CL 103 03/06/2021  CO2 21 03/06/2021  CREATININE 0.70 03/06/2021  BUN 12 03/06/2021  GLU 112 (H) 03/06/2021  CALCIUM 8.4 (L) 03/06/2021  MG 2.0 03/06/2021  PHOS 2.8 03/06/2021 CBC:Lab Results Component Value Date  WBC 7.8 03/06/2021  HGB 12.2 03/06/2021  HCT 39.50 03/06/2021  PLT 261 03/06/2021 PT/INR/PTT:Lab Results Component Value Date  INR 1.89 (H) 03/06/2021  PTT 47.6 (H) 10/02/2020 LFTs:Lab Results Component Value Date  BILITOT 0.2 03/04/2021  BILIDIR <0.2 03/04/2021  AST 22 03/04/2021  ALT 29 03/04/2021  ALKPHOS 163 (H) 03/04/2021  AMYLASE 41 08/22/2014  LIPASE 28 03/04/2021 Microbiology:Lab Results Component Value Date  LABBLOO  06/06/2013   No growth after 5 days of incubation.All Blood Culture Bottles with growth will be reported  LABBLOO  05/29/2013   No growth after  5 days of incubation.All Blood Culture Bottles with growth will be reported  LABBLOO  05/29/2013   No growth after 5 days of incubation.All Blood Culture Bottles with growth will be reported  LABURIN 10,000-49,000 CFU/mL Lactobacillus species (A) 03/05/2021  LABURIN  06/16/2016   Less than 10,000 CFU/mL. Clinical significance is unlikely for organism(s) present in quantities of less than 10,000 CFU/mL.  LABURIN Less than 10,000 cfu/mL 06/04/2013  LABURIN No Growth 05/29/2013  LABURIN No Growth 05/27/2013  LOWERRESPIRA 2+ Normal Flora 05/29/2013 Imaging results in the last 24 hours: US NeckResult Date: 11/10/2022US NECK Clinical History: Left sided submandibular swelling Comparison: No priors Technique: Grayscale and color Doppler were utilized. Findings: Left submandibular region or the area of concern was examined. No discrete fluid collection, area of hyperemia, or mass is seen. Impression: No discrete fluid collection or mass in the left submandibular region. If there is a persistent clinical concern, Rinard neck with IV contrast can be considered for better evaluation. Report Initiated By:  Maria Needle, MD Reported And Signed By: Maria Mutton, MD  [facility] Assessment/ Plan: Maria [name] is a 70 y.o. female now   with an extensive surgical Hx as detailed above with multiple episodes of SBO who presented on 03/04/2021 with a recurrent SBO, awaiting return of the bowel function- Tolerating clears- Advance to low residuals- HLIV- Bridge to Coumadin- Pain adequately controlled with analgesics, Continue pain regimen- Continue PPx-     Continue SCD, SQ Hep q8h- F/U labs with PT/INR- AM labs- OOBA as tolerated, Ambulatex4- Monitor vitals q4hr- Continue home meds as ordered - F/U with the Urine Culture- Dispo: Floor, likely discharge today if patient tolerates residuals well with F/U to General Surgery in 2 weeks, F/U to outpatient ENT for the submandibular swellingPlease see attending addendum for corrections and final plan.Signed:Maddux [name] Maria [name], MBBS11/11/227:36 AMAttending Comments:

## 2021-03-07 NOTE — Discharge Summary
[facility]-SrcMed/Surg Discharge SummaryPatient Data:  Patient Name: Maria [name] Admit date: 03/04/2021 Age: 70 y.o. Discharge date: 03/07/21  DOB: 02-10-1951	 Discharge Attending Physician: Tacy Dura, MD  MRN: [id number]	 Discharged Condition: good PCP: Cena Benton, MD Disposition: Home  Principal Diagnosis: Small bowel obstructionIssues to be Addressed Post Discharge: Issues to be Addressed Post Discharge:1.	Follow up as needed2.	3.	Pending Labs and Tests: Pending Lab Results   Order Current Status  Urine culture In process  Follow-up Information:O'Brien, Saunders Glance., MD60 Maia Plan [facility] [phone] an appointment as soon as possible for a visitAs needed Future Appointments Date Time Provider Department Center 03/26/2021  1:15 PM [facility] MA NP 1268 MAM2 [facility] MAMMO [facility] Rad 05/26/2021 11:30 AM Chow, Shanda Bumps, MD [facility] EYE YM CAD Hospital Course: Hospital Course: Maria [name] is a 70 y.o. 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), extensive surgical history including exlap with extended left colectomy and creation of colostomy [name] 04/2013), Stage 2 Hatman's procedure with extensive LOA [name] 01/2014), ex lap, LOA and ventral hernia repair with mesh [name] 07/2014), exlap with extensive LOA and enterotomy repair [name] 11/2015), ventral hernia repair with mesh [name] 01/2020), with multiple recurrent SBO, last admission being on 11/13/2020, treated non operative, (Patient also reports recent admission to OSH while in [place] on 11/2020), who presented to the ED on 03/04/2021 with another episode of SBO. Patient reported current episode of lower abdominal pain started the evening PTA and has progressively been getting worse, patient was passing gas and LMB was the day PTA. Patient reports one episode of emesis and continues to have mild nausea. ?In ED,  patient HDS, afebrile, reports improved pain control with administration of morphine. WU remarkable for WBC 14.7, HnH 14/43, Cr 0.8, glucose 214, POC lactate 0.4, electrolytes WNL, AP 163, rest of LFTs WNL.  AP showing recurrent SBO with transition point in ventral abdomen, minimal mesenteric edema and lower ventral abdominal hernia containing loop of non obstructed bowel. Patient was admitted to the surgical service for conservative management of small bowel obstruction. NGT decompression was deferred as patient promptly reported passing flatus and feeling improved. The following day she was initiated on a clear liquid diet. In setting of warfarin being held, INR was monitored and Lovenox started when below 2.0. She had a bowel movement on hospital day 3 and was advanced to a regular diet with good toleration. She was deemed stable for discharge home and will bridge her warfarin with lovenox which will be monitored by her outpatient coumadin clinic. Of note, she reported some left submandibular swelling on 11/10, which she had a few weeks ago and was treated for strep pharyngitis. This time the swelling was similar but she denied any throat pain and endorsed significant dry mouth. She denied dysphagia or dyspnea. An ultrasound of the area was obtained without any significant findings, and the swelling improved with the initiation of a diet. She is already scheduled for outpatient ENT follow up for this issue.Pertinent lab findings and test results: Objective: Recent Labs Lab 11/08/222142 11/10/220450 WBC 14.7* 7.8 HGB 14.6 12.2 HCT 43.50 39.50 PLT 350 261  Recent Labs Lab 11/08/222142 11/10/220450 NEUTROPHILS 81.1* 64.4  Recent Labs Lab 11/08/222142 11/09/220410 11/10/220450 11/10/220505 11/10/220811 NA 138  --  135*  --   --  K 4.6  --  4.0  --   --  CL 98  --  103  --   --  CO2 27 --  21  --   --  BUN 16  --  12  --   --  CREATININE 0.80  --  0.70  --   --  GLU 214*   < > 85   < > 77 ANIONGAP 13  --  11  --   --   < > = values in this interval not displayed.  Recent Labs Lab 11/08/222142 11/10/220450 CALCIUM 9.7 8.4* MG  --  2.0 PHOS  --  2.8  Recent Labs Lab 11/08/222142 ALT 29 AST 22 ALKPHOS 163* BILITOT 0.2 BILIDIR <0.2  Recent Labs Lab 11/09/220636 11/10/220914 LABPROT 27.4* 19.3* INR 2.74* 1.89*  Culture Information:No results for input(s): LABBLOO, LABURIN, LOWERRESPIRA in the last 168 hours.Imaging: [place]don ABDOMEN PELVIS W IV CONTRASTHISTORY: Abdominal pain, acute, nonlocalized COMPARISON: [place]oving ABDOMEN PELVIS W IV CONTRAST 2022-Jul-19TECHNIQUE: Niland images of the abdomen and pelvis were obtained from the diaphragms to the pubic symphysis after the administration of 80 cc intravenous contrast (Omnipaque 350).??FINDINGS:LUNG BASES: Unremarkable.?LIVER: 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 The liver otherwise appears unremarkable.GALLBLADDER: Unremarkable.SPLEEN: Unremarkable.PANCREAS: Unremarkable.ADRENALS: Unremarkable.KIDNEYS: Unremarkable.?BOWEL: The patient is status post ventral hernia repair. There is recurrent small bowel obstruction with a point of transition in the ventral abdomen, likely due to adhesions with a possible zone of transition identified on series 3, image 54. There is minimal mesenteric edema, reactive. Status post right hemicolectomy. A lower ventral abdominal hernia contains a loop of nonobstructed bowel.?PERITONEUM: No intraperitoneal free fluid. No intraperitoneal free air.?LYMPH NODES: Unremarkable.VESSELS: SMA/SMV are patent.?URINARY BLADDER: Unremarkable.PELVIS: Unremarkable.?BONES & SOFT TISSUE: Unremarkable.??IMPRESSION:Recurrent small bowel obstruction with zone of transition in the ventral abdomen, likely due to adhesions from hernia repair.?Findings were discussed with Dr. Derrell Lolling at 03/05/2021 2:35 AM.?Report Initiated By:  Sheppard Evens, MD?Reported And Signed By: Mikki Santee, MD  [facility] and Biomedical ImagingDiet:  Regular dietMobility: Highest Level of mobility - ACTUAL: Mobility Level 8, Walk 250+ feet AM PAC 24  Physical Exam Discharge vitals: Temp:  [97.4 ?F (36.3 ?C)-98 ?F (36.7 ?C)] 97.4 ?F (36.3 ?C)Pulse:  [70-80] 70Resp:  [16-18] 18BP: (115-145)/(63-75) 145/73SpO2:  [94 %-100 %] 99 %Device (Oxygen Therapy): room air Discharge Physical Exam:Physical ExamGeneral: AO, comfortably lying on bedPulmonary: breathing comfortably on RA Heart: RRAbdomen: Soft, nontenderExt:WWPAllergies Allergies Allergen Reactions ? Morphine Hives and Itching   Pt tolerated morphine during admission 08/08/16-08/09/16.  Pt premedicated with diphenhydramine ? Biaxin  [Clarithromycin]  ? Latex, Natural Rubber Rash  PMH PSH Past Medical History: Diagnosis Date ? Acute ischemic colitis (HC Code) (HC CODE) (HC Code) 2015 ? Depression  ? Diabetes mellitus (HC Code) (HC CODE) (HC Code)  ? Hypercholesteremia  ? Lupus anticoagulant disorder (HC Code) (HC CODE) (HC Code)  ? Rosacea  ? Ventral hernia with bowel obstruction  ? Vertigo   Past Surgical History: Procedure Laterality Date ? ABDOMINAL ADHESION SURGERY  2016 ? BLEPHAROPLASTY Bilateral 05/2016 ? COLOSTOMY  05/27/13 ? COLOSTOMY CLOSURE   ? HYSTEROSCOPY W/ POLYPECTOMY  06/18/2020 ? KNEE LIGAMENT RECONSTRUCTION Right 2004 ? LAPAROSCOPIC NISSEN FUNDOPLICATION    2000 ? ? SINUS SURGERY   ? THYROID BIOPSY   ? TUBAL LIGATION   ? UMBILICAL HERNIA REPAIR   ? VENTRAL HERNIA REPAIR    Social History Family History Social History Tobacco Use ? Smoking status: Never Smoker ? Smokeless tobacco: Never Used Substance Use Topics ? Alcohol use: Not Currently  Family History Problem Relation Age of Onset ? Bladder cancer Mother  ? Colon cancer Paternal Aunt  ? Colon cancer Cousin       died of [place]  cancer at age 21 ? Breast cancer Cousin       2 cousins with breast cancer ? Colon cancer Father   Discharge Medications: Discharge: Current Discharge Medication List  CONTINUE these medications which have CHANGED  Details enoxaparin (LOVENOX) 100 mg/mL syringe Inject 1 mL (100 mg total) under the skin daily.Qty: 7 mL, Refills: 0Start date: 03/07/2021   CONTINUE these medications which have NOT CHANGED  Details acetaminophen (TYLENOL) 500 mg tablet Take 1 tablet (500 mg total) by mouth every 4 (four) hours as needed.Qty: 30 tablet, Refills: 11  biotin 1 mg tablet Take 1 tablet by mouth daily.   cholecalciferol, vitamin D3, 125 mcg (5,000 unit) tablet Take 1 tablet by mouth every other day..  diphenhydramine HCl (BENADRYL ALLERGY ORAL) Take 1 tablet by mouth as needed.  gabapentin (NEURONTIN) 100 mg capsule Take 100 mg by mouth at bedtime.  JARDIANCE 25 mg tablet 1 TABLET BY MOUTH ONCE DAILY IN THE MORNING FOR 30 DAYS  meclizine (ANTIVERT) 12.5 mg tablet Take 1 tablet (12.5 mg total) by mouth daily as needed for DizzinessQty: 30 tablet, Refills: 1  Associated Diagnoses: Vertigo  melatonin 10 mg Tab Take 10 mg by mouth nightly as needed.   multivitamin tablet Take 1 tablet by mouth daily.  olopatadine (PATADAY) 0.2 % ophthalmic solution Place 1 drop into both eyes daily.Qty: 2.5 mL, Refills: 6  Associated Diagnoses: Allergic conjunctivitis of both eyes  ondansetron (ZOFRAN) 4 MG tablet Take 4 mg by mouth every 8 (eight) hours as needed for Nausea.  phytonadione, vit K1, (VITAMIN K) 100 mcg tablet Take 100 mcg by mouth daily.  prasterone, dhea,-calcium carb (DHEA) 10 mg-47 mg calcium Tab Take by mouth.  pravastatin (PRAVACHOL) 40 MG tablet Take 60 mg by mouth nightly.   senna (SENOKOT) 8.6 mg tablet Take 2 tablets by mouth nightly.Rob Bunting: 30 tablet, Refills: 11  Associated Diagnoses: SBO (small bowel obstruction) (HC Code) (HC CODE) (HC Code)  sitaGLIPtin (JANUVIA) 100 MG tablet Take 100 mg by mouth daily.[name]  venlafaxine (EFFEXOR XR) 150 mg XR 24 hr extended release capsule Take 150 mg by mouth daily.   warfarin (COUMADIN) 5 mg tablet Take 1 tablet (5 mg total) by mouth daily. Or as directed.Qty: 90 tablet, Refills: 3  polyethylene glycol (MIRALAX) 17 gram packet Take 1 packet (17 g total) by mouth daily. Mix in 8 ounces of water, juice, soda, coffee or tea prior to taking.Qty: 14 each, Refills: 2   STOP taking these medications   omega-3 fatty acids 1,000 mg capsule    Electronically Signed:Shannon Maxon, PA / Wendee Copp, PA11/01/2021 11:05 AM

## 2021-03-07 NOTE — Discharge Instructions
Patient Education *** You may resume your coumadin tonight. Please also take lovenox as directed until instructed by your coumadin clinic***Small Bowel Obstruction Discharge Instructions About this topic A small bowel obstruction is when the small bowel is blocked. Doctors may order tests to diagnose a blocked small bowel. The small bowel may be partly blocked or totally blocked. This obstruction may cause a buildup of stool and other digested contents inside the bowel. It may cause serious problems if not treated right away. What care is needed at home? Ask your doctor what you need to do when you go home. Make sure you ask questions if you do not understand what the doctor says. This way you will know what you need to do.You may have a tube in your nose in the hospital to remove digestive fluids. If you still have a tube in your nose when you go home, your doctor will tell you how to clean and care for it.Take your drugs as ordered by your doctor.If you had surgery, ask your doctor about how to care for your cut site:When you should change your bandagesWhen you may take a bath or showerIf you need to be careful with lifting things over 10 pounds (4.5 kg)When you may go back to your normal activities like work and drivingWhat follow-up care is needed? Your doctor may ask you to make visits to the office to check on your progress. Be sure to keep these visits.If you have stitches or staples, you will need to have them taken out. Your doctor will often want to do this in 1 to 2 weeks. If the doctor used skin glue, the glue will fall off on its own.Your doctor may order extra tests to see how well you are doing.If this condition was caused by cancer, your doctor may tell you to have other treatments for the cancer.What drugs may be needed? The doctor may order drugs ZO:XWRU with painFight an infectionKeep your stool softWill physical activity be limited? Physical activities may be limited if you are suffering from pain. Talk to your doctor about the right amount of activity for you. If your doctor says it is okay, take short walks every day to help prevent blood clots.What changes to diet are needed? Ask your doctor or dietitian for a diet plan. Your doctor may suggest a liquid or soft diet until your bowel is stable and ready for regular food. Drink lots of clear liquids. Clear liquids include water, fruit juices, soup broth, gelatin, popsicles that do not have fruit or fruit pulp, tea or coffee with no added milk, and sports drinks. Avoid beer, wine, and mixed drinks (alcohol) and limit caffeine.What problems could happen? Hard stoolsDehydrationInfectionTear or hole in the small bowelTissue deathBowel function does not return to normalWhen do I need to call the doctor? Signs of infection. These include a fever of 100.4?F (38?C) or higher, chills, pain with passing urine or not able to pass urine, wound that will not heal.Signs of wound infection. These include swelling, redness, warmth around the wound; too much pain when touched; yellowish, greenish, or bloody discharge; foul smell coming from the cut site; cut site opens up.Unable to pass stool or gasVery upset stomach or throwing upVery bad belly painTeach Back: Helping You Understand The Teach Back Method helps you understand the information we are giving you. After you talk with the staff, tell them in your own words what you learned. This helps to make sure the staff has described each thing clearly. It also  helps to explain things that may have been confusing. Before going home, make sure you can do these:I can tell you about my condition.I can tell you how to care for my cut site, if I have one.I can tell you what I will do if I have an upset stomach, throwing up, or am not able to pass stool or gas.Where can I learn more? [employer] Last Reviewed Date 2021-05-19Consumer Information Use and Disclaimer This generalized information is a limited summary of diagnosis, treatment, and/or medication information. It is not meant to be comprehensive and should be used as a tool to help the user understand and/or assess potential diagnostic and treatment options. It does NOT include all information about conditions, treatments, medications, side effects, or risks that may apply to a specific patient. It is not intended to be medical advice or a substitute for the medical advice, diagnosis, or treatment of a health care provider based on the health care provider's examination and assessment of a patient?s specific and unique circumstances. Patients must speak with a health care provider for complete information about their health, medical questions, and treatment options, including any risks or benefits regarding use of medications. This information does not endorse any treatments or medications as safe, effective, or approved for treating a specific patient. UpToDate, Inc. and its affiliates disclaim any warranty or liability relating to this information or the use thereof. The use of this information is governed by the Terms of Use, available at https://www.wolterskluwer.com/en/know/clinical-effectiveness-terms Copyright Copyright ? 2022 UpToDate, Inc. and its affiliates and/or licensors. All rights reserved.

## 2021-03-07 NOTE — Consults
PHARMACY-ASSISTED MEDICATION REPORTPharmacist review of the best possible medication history obtained by the pharmacy medication history technician has been performed.  I have updated the home medication list and identified the following information that may be relevant to this admission.NOTES/RECOMMENDATIONSPatient only reports using therapeutic enoxaparin when off of warfarinPatient reports completed therapy for tramadol 50mg  tablet and not a chronic home medication        Prior to Admission Medications Medication Name Sig Taking? Patient Reported   acetaminophen (TYLENOL) 500 mg tabletLast dose:  --  Take 1 tablet (500 mg total) by mouth every 4 (four) hours as needed. Yes     biotin 1 mg tabletLast dose:  -- Last Medication Note: >> Doloris Hall   XBJ Jul 16, 2018  6:52 PM Entered by Ellard Artis, Amarylis, CPHT Sat Jul 16, 2018 4782 Take 1 tablet by mouth daily.  Yes Yes   cholecalciferol, vitamin D3, 125 mcg (5,000 unit) tabletLast dose:  -- Last Medication Note: >> Durwin Glaze   NFA Nov 21, 2016 10:17 AM Entered by Melony Overly, PharmD Sat Nov 21, 2016 1017 Take 1 tablet by mouth every other day.. Yes Yes   diphenhydramine HCl (BENADRYL ALLERGY ORAL)Last dose:  -- Last Medication Note: >> Buckner Malta Mar 05, 2021  1:51 PM Entered by Roma Kayser, CPHT Wed Mar 05, 2021 1351 Take 1 tablet by mouth as needed. Yes Yes   enoxaparin (LOVENOX) 100 mg/mL syringeLast dose: Not Taking at Unknown timeLast Medication Note: >> Buckner Malta Mar 05, 2021  1:53 PMMedHX Tech(Ashley Fredric Mare, CPHT):  Patient reports only taking when off warfarinEntered by Roma Kayser, CPHT Wed Mar 05, 2021 1353 Inject 1 mL (100 mg total) under the skin daily.Patient not taking: Reported on 03/05/2021       gabapentin (NEURONTIN) 100 mg capsuleLast dose:  --  Take 100 mg by mouth at bedtime. Yes Yes JARDIANCE 25 mg tabletLast dose:  --  1 TABLET BY MOUTH ONCE DAILY IN THE MORNING FOR 30 DAYS Yes Yes   meclizine (ANTIVERT) 12.5 mg tabletLast dose:  -- Last Medication Note: >> Buckner Malta Mar 05, 2021  1:48 PM Entered by Roma Kayser, CPHT Wed Mar 05, 2021 1348 Take 1 tablet (12.5 mg total) by mouth daily as needed for Dizziness Yes     melatonin 10 mg TabLast dose:  -- Last Medication Note: >> Buckner Malta Mar 05, 2021  1:51 PM Entered by Roma Kayser, CPHT Wed Mar 05, 2021 1351 Take 10 mg by mouth nightly as needed.  Yes Yes   multivitamin tabletLast dose:  -- Last Medication Note: >> Buckner Malta Mar 05, 2021  1:51 PM Entered by Roma Kayser, CPHT Wed Mar 05, 2021 1351 Take 1 tablet by mouth daily. Yes Yes   olopatadine (PATADAY) 0.2 % ophthalmic solutionLast dose:  --  Place 1 drop into both eyes daily. Yes     ondansetron (ZOFRAN) 4 MG tabletLast dose:  -- Last Medication Note: >> Buckner Malta Mar 05, 2021  1:48 PM Entered by Roma Kayser, CPHT Wed Mar 05, 2021 1348 Take 4 mg by mouth every 8 (eight) hours as needed for Nausea. Yes Yes   phytonadione, vit K1, (VITAMIN K) 100 mcg tabletLast dose:  --  Take 100 mcg by mouth daily. Yes Yes   polyethylene glycol (MIRALAX) 17 gram packetLast dose: More than a month at Unknown timeLast Medication Note: >>  Roma Kayser   Wed Mar 05, 2021  1:53 PM Entered by Roma Kayser, CPHT Wed Mar 05, 2021 1353 Take 1 packet (17 g total) by mouth daily. Mix in 8 ounces of water, juice, soda, coffee or tea prior to taking.       prasterone, dhea,-calcium carb (DHEA) 10 mg-47 mg calcium TabLast dose:  --  Take by mouth. Yes Yes   pravastatin (PRAVACHOL) 40 MG tabletLast dose:  -- Last Medication Note: >> Buckner Malta Mar 05, 2021  1:49 PMMedHX Tech(Ashley Fredric Mare, CPHT):  Patient reports taking 1.5 tablets for 60mg  nightly Entered by Roma Kayser, CPHT Wed Mar 05, 2021 1349 Take 60 mg by mouth nightly.  Yes Yes   senna (SENOKOT) 8.6 mg tabletLast dose:  -- Last Medication Note: >> Buckner Malta Mar 05, 2021  1:54 PMMedHX Tech(Ashley Fredric Mare, CPHT):  Patient reports taking 1 tablet every other day Entered by Roma Kayser, CPHT Wed Mar 05, 2021 1354 Take 2 tablets by mouth nightly.. Yes     sitaGLIPtin (JANUVIA) 100 MG tabletLast dose:  -- Last Medication Note: >> Ezzard Flax Apr 09, 2020  6:19 PM Entered by Francis Dowse, CPHT Tue Apr 09, 2020 1819 Take 100 mg by mouth daily.. Yes Yes   traMADoL (ULTRAM) 50 mg tabletLast dose:  -- Last Medication Note: >> Buckner Malta Mar 05, 2021  1:54 PMMedHx Tech(Ashley Fredric Mare, CPHT): FLAG FOR REMOVAL -  prescription for one time use, which was completedEntered by Roma Kayser, CPHT Wed Mar 05, 2021 1354 Take 1 tablet (50 mg total) by mouth every 4 (four) hours as needed for pain for up to 7 doses.Patient not taking: No sig reported       venlafaxine (EFFEXOR XR) 150 mg XR 24 hr extended release capsuleLast dose:  -- Last Medication Note: >> Ezzard Flax Apr 09, 2020  6:19 PM Entered by Francis Dowse, CPHT Tue Apr 09, 2020 1819 Take 150 mg by mouth daily.  Yes Yes   warfarin (COUMADIN) 5 mg tabletLast dose:  --  Take 1 tablet (5 mg total) by mouth daily. Or as directed. Yes     Prior to admission medications last reviewed by Cyril Loosen on Wed Mar 05, 2021 1346 Thank you,Dencil Cayson Domenic Schwab, PharmD11/11/202210:32 AMPhone: MHB

## 2021-03-08 ENCOUNTER — Encounter: Admit: 2021-03-08 | Payer: PRIVATE HEALTH INSURANCE | Primary: Internal Medicine

## 2021-03-09 NOTE — Progress Notes
Clinical Operations call center discharge outreach completed, including review of discharge instructions, scheduled medical appointments, and any prescriptions written.Patient stated she is feeling much better.  Obtained medications.  Will call MD tomorrow to do follow up.  No other issues or concerns at this time.

## 2021-03-10 NOTE — Plan of Care
[facility]		Spiritual Care NotePurpose: Referral Source: Patient Observation: People present/Information Obtained From: Patient, Spouse/Partner Emotional Mood: Hopeful Subjective: Followed up with Maria [name], whose spouse was visiting. They were in good spirits and hopeful that pt would return home soon. They shared about her long struggle with her illness. They spoke about their [employer] [employer] faith which has helped them cope. They also spoke at length about family relationships and the support they receive from them. Offered compassionate listening and encouragement. They expressed gratitude for the visit.Spiritual Assessment:Referral Source: Patient Information Obtained From: Patient, Spouse/Partner Mood: Hopeful  Spiritual Interventions:Spiritual Intervention Index**Date of Spiritual Visit: 11/11/22Visit Type: Follow Up VisitIntervention Type: Spiritual VisitResponding Chaplain: Unit ChaplainSpiritual/Religious Support Provided: Family Support, Spiritual SupportOutcome: OUTCOMES: Expressed Spiritual/Religious Resources or Distress: Expressed hope, Identified important values and beliefs   OUTCOMES: Progressed Emotionally or Physically: Increased comfort  Plan: Will continue to follow up, as needed.  Total Consult Time: 70 minutes	Signed: Beaulah Dinning, MDivMHB: [phone] chaplain available 24/7:SRC: [phone]: [phone] 11/14/20228:56 AM

## 2021-03-17 ENCOUNTER — Inpatient Hospital Stay: Admit: 2021-03-17 | Discharge: 2021-03-17 | Payer: PRIVATE HEALTH INSURANCE | Primary: Internal Medicine

## 2021-03-17 ENCOUNTER — Ambulatory Visit: Admit: 2021-03-17 | Payer: PRIVATE HEALTH INSURANCE | Primary: Internal Medicine

## 2021-03-17 DIAGNOSIS — K559 Vascular disorder of intestine, unspecified: Secondary | ICD-10-CM

## 2021-03-17 DIAGNOSIS — D6862 Lupus anticoagulant syndrome: Secondary | ICD-10-CM

## 2021-03-17 DIAGNOSIS — Z7901 Long term (current) use of anticoagulants: Secondary | ICD-10-CM

## 2021-03-17 DIAGNOSIS — Z5181 Encounter for therapeutic drug level monitoring: Secondary | ICD-10-CM

## 2021-03-17 LAB — PROTIME AND INR
BKR INR: 3.03 — ABNORMAL HIGH (ref 0.92–1.19)
BKR PROTHROMBIN TIME: 29.6 seconds — ABNORMAL HIGH (ref 9.6–12.3)

## 2021-03-17 NOTE — Progress Notes
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  The patient was contacted regarding lab drawn INR results.  Patient's INR findings and prescribed dose from last visit:Anticoagulation Monitoring Latest Ref Rng & Units 02/05/2021 02/26/2021 03/17/2021 INR 2 - 3 - - - INR 0.91 - 1.10 - - - INR (External) 0.8 - 1.15 - - - Assoc. INR Date - 02/05/2021 02/26/2021 03/17/2021 Associated INR - 1.36 2.60 3.03 Instructions - 10/12: 10 mg; Otherwise 5 mg every day 5 mg every day 5 mg every day Last 7 day dose total - 32.5 mg 35 mg 35 mg Next INR Date - 02/10/2021 03/12/2021 03/31/2021 The following findings were reported from today's call (no findings if none selected):[]  Missed doses []  Extra doses []  Change in medications []  Change in vitamin k rich food intake (green vegetables, herbals, etc) []  Change in alcohol use []  Change in overall health [x]  Recent hospitalization / emergency department visit []  Upcoming procedure (including dental procedures) []  Other Comments:       Patient was admitted for SBO The following side effects were reported from today's call (no side effects if none selected):[]  Bleeding []  New bruising []  Warfarin-emergency department visit or hospital admission []  Other Comments:   No side effects Assessment/Plan:Patient's INR is 3.03, which is within goal range of 2.0-3.0.  Patient was admitted for SBO that was surginally repaired.  She was instructed to continue current weekly dose as indicated in the maintenance plan below.  Next INR assessment due 2 weeks.  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  Electronically Signed by [name], RPH, March 17, 2021 [name] Anticoagulation Clinic  [address], [place], [place] [address]  Phone:[phone]  Fax: [phone]

## 2021-03-26 ENCOUNTER — Inpatient Hospital Stay: Admit: 2021-03-26 | Discharge: 2021-03-26 | Payer: PRIVATE HEALTH INSURANCE | Primary: Internal Medicine

## 2021-03-26 DIAGNOSIS — Z1231 Encounter for screening mammogram for malignant neoplasm of breast: Secondary | ICD-10-CM

## 2021-04-11 ENCOUNTER — Ambulatory Visit: Admit: 2021-04-11 | Payer: PRIVATE HEALTH INSURANCE | Primary: Internal Medicine

## 2021-04-11 ENCOUNTER — Inpatient Hospital Stay: Admit: 2021-04-11 | Discharge: 2021-04-11 | Payer: PRIVATE HEALTH INSURANCE | Primary: Internal Medicine

## 2021-04-11 DIAGNOSIS — Z7901 Long term (current) use of anticoagulants: Secondary | ICD-10-CM

## 2021-04-11 DIAGNOSIS — Z5181 Encounter for therapeutic drug level monitoring: Secondary | ICD-10-CM

## 2021-04-11 DIAGNOSIS — K559 Vascular disorder of intestine, unspecified: Secondary | ICD-10-CM

## 2021-04-11 DIAGNOSIS — D6862 Lupus anticoagulant syndrome: Secondary | ICD-10-CM

## 2021-04-11 LAB — PROTIME AND INR
BKR INR: 5.24 — CR (ref 0.92–1.19)
BKR PROTHROMBIN TIME: 49.6 seconds — ABNORMAL HIGH (ref 9.6–12.3)

## 2021-04-11 NOTE — Progress Notes
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  The patient was contacted regarding lab drawn INR results.  Patient's INR findings and prescribed dose from last visit:  02/26/2021 03/17/2021 04/11/2021 Anticoagulation Monitoring Assoc. INR Date 02/26/2021 03/17/2021 04/11/2021 Associated INR 2.60 3.03 5.24 Instructions 5 mg every day 5 mg every day 12/16: Hold; 12/17: Hold; 12/18: 2.5 mg; Otherwise 5 mg every day; Starting 04/11/2021 Sunday Dose 5 mg 5 mg 2.5 mg (12/18) Monday Dose 5 mg 5 mg - Tuesday Dose 5 mg 5 mg - Wednesday Dose 5 mg 5 mg - Thursday Dose 5 mg 5 mg - Friday Dose 5 mg 5 mg Hold (12/16) Saturday Dose 5 mg 5 mg Hold (12/17) Last 7 day dose total 35 mg 35 mg 35 mg Next INR Date 03/12/2021 03/31/2021 04/14/2021   Multiple values from one day are sorted in reverse-chronological order The following findings were reported from today's call (no findings if none selected):[]  Missed doses []  Extra doses []  Change in medications []  Change in vitamin k rich food intake (green vegetables, herbals, etc) []  Change in alcohol use []  Change in overall health []  Recent hospitalization / emergency department visit []  Upcoming procedure (including dental procedures) []  Other Comments:       No findings The following side effects were reported from today's call (no side effects if none selected):[]  Bleeding []  New bruising []  Warfarin-emergency department visit or hospital admission []  Other Comments:   No side effects Assessment/Plan:Patient's INR is 5.24, which is above goal range of 2.0-3.0.  Patient cannot get INR drawn on Sunday as [place] to [name] so she will have INR drawn at [facility] on Monday there.She does not have any signs or symptoms of bleeding and knows to go to the ER if she experiences any.  She was instructed to hold 2 dose(s) and take 2.5 mg on Sunday as indicated in the maintenance plan below.  Next INR assessment due 04/14/21. This information was routed to the covering provider for review.Dr. 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  Electronically Signed by Maria [name], RPH, April 11, 2021 [facility] Anticoagulation Clinic  [address], [place], [place] [address]  Phone:[phone]  Fax: [phone]

## 2021-04-14 ENCOUNTER — Encounter
Admit: 2021-04-14 | Payer: PRIVATE HEALTH INSURANCE | Attending: Vascular and Interventional Radiology | Primary: Internal Medicine

## 2021-04-14 ENCOUNTER — Encounter: Admit: 2021-04-14 | Payer: PRIVATE HEALTH INSURANCE | Primary: Internal Medicine

## 2021-04-14 ENCOUNTER — Telehealth: Admit: 2021-04-14 | Payer: PRIVATE HEALTH INSURANCE | Attending: Medical Oncology | Primary: Internal Medicine

## 2021-04-14 DIAGNOSIS — Z5181 Encounter for therapeutic drug level monitoring: Secondary | ICD-10-CM

## 2021-04-14 LAB — INR (ABSTRACTED): INR (ABSTRACTED): 2.3 — AB (ref .8–1.15)

## 2021-04-14 NOTE — Telephone Encounter
Rec'd call from patient, she is asking if her INR request went to [facility] in [place]. Please reach out to patient [phone]

## 2021-04-16 ENCOUNTER — Telehealth: Admit: 2021-04-16 | Payer: PRIVATE HEALTH INSURANCE | Attending: Medical Oncology | Primary: Internal Medicine

## 2021-04-16 ENCOUNTER — Ambulatory Visit: Admit: 2021-04-16 | Payer: PRIVATE HEALTH INSURANCE | Primary: Internal Medicine

## 2021-04-16 DIAGNOSIS — K559 Vascular disorder of intestine, unspecified: Secondary | ICD-10-CM

## 2021-04-16 DIAGNOSIS — D6862 Lupus anticoagulant syndrome: Secondary | ICD-10-CM

## 2021-04-16 NOTE — Telephone Encounter
Received call from patient, she would like a call back from [place] in the Warfarin Clinic. Please contact patient to discuss [phone].

## 2021-04-16 NOTE — Progress Notes
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  The patient was contacted regarding lab drawn INR results.  Patient's INR findings and prescribed dose from last visit:  04/11/2021 04/14/2021 04/16/2021 Anticoagulation Monitoring INR (External)Latest Ref Range & Units: 0.8 - 1.15   2.30       Assoc. INR Date 04/11/2021  04/14/2021 Associated INR 5.24  2.30 Instructions 12/16: Hold; 12/17: Hold; 12/18: 2.5 mg; Otherwise 5 mg every day; Starting 04/11/2021  5 mg every day; Starting 04/16/2021 Sunday Dose 2.5 mg (12/18)  5 mg Monday Dose -  - Tuesday Dose -  - Wednesday Dose -  5 mg Thursday Dose -  5 mg Friday Dose Hold (12/16)  5 mg Saturday Dose Hold (12/17)  5 mg Last 7 day dose total 35 mg  20 mg Next INR Date 04/14/2021  04/21/2021   This result is from an external source.  Multiple values from one day are sorted in reverse-chronological order The following findings were reported from today's call (no findings if none selected):[]  Missed doses []  Extra doses []  Change in medications []  Change in vitamin k rich food intake (green vegetables, herbals, etc) []  Change in alcohol use []  Change in overall health []  Recent hospitalization / emergency department visit []  Upcoming procedure (including dental procedures) []  Other Comments:       No findings The following side effects were reported from today's call (no side effects if none selected):[]  Bleeding []  New bruising []  Warfarin-emergency department visit or hospital admission []  Other Comments:   No side effects Assessment/Plan:Patient's INR is 2.3, which is within goal range of 2.0-3.0. She was instructed to continue current weekly dose as indicated in the maintenance plan below.  Next INR assessment due 1 week.  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  Electronically Signed by [name], RPH, April 16, 2021 [facility] Anticoagulation Clinic  [address], [place], [place] [address]  Phone:[phone]  Fax: [phone]

## 2021-04-16 NOTE — Telephone Encounter
Pt called looking for INR results, they where done on [facility] fax#: [phone] to request results. Awaiting a call back from [place].

## 2021-04-16 NOTE — Progress Notes
Returned patients call looking for INR results. Patient states her INR was drawn on Monday at [facility] in [place] and results were faxed, but none were reported to INR clinic. Gave patient fax number for [facility] clinic and asked for [facility] phone number to get result.[name], RPHClinical Pharmacist, Anticoagulation ClinicSmilow [facility] and [facility]

## 2021-04-18 ENCOUNTER — Encounter: Admit: 2021-04-18 | Payer: PRIVATE HEALTH INSURANCE | Primary: Internal Medicine

## 2021-04-18 DIAGNOSIS — Z5181 Encounter for therapeutic drug level monitoring: Secondary | ICD-10-CM

## 2021-04-25 ENCOUNTER — Encounter: Admit: 2021-04-25 | Payer: PRIVATE HEALTH INSURANCE | Primary: Internal Medicine

## 2021-04-25 DIAGNOSIS — Z5181 Encounter for therapeutic drug level monitoring: Secondary | ICD-10-CM

## 2021-05-01 ENCOUNTER — Telehealth: Admit: 2021-05-01 | Payer: PRIVATE HEALTH INSURANCE | Attending: Medical Oncology | Primary: Internal Medicine

## 2021-05-01 NOTE — Progress Notes
Returned patients call about INR result. We have not had any result sent to our clinic therefore patient was advised to send [place] a screenshot of a lab report or ask the lab to send [place] a copy.[name], RPHClinical Pharmacist, Anticoagulation ClinicSmilow [facility] and [facility]

## 2021-05-01 NOTE — Telephone Encounter
Patient had INR checked last week, she couldn't get msg until today, she would like a return call [phone]

## 2021-05-02 ENCOUNTER — Encounter: Admit: 2021-05-02 | Payer: PRIVATE HEALTH INSURANCE | Primary: Internal Medicine

## 2021-05-02 DIAGNOSIS — Z5181 Encounter for therapeutic drug level monitoring: Secondary | ICD-10-CM

## 2021-05-09 ENCOUNTER — Encounter: Admit: 2021-05-09 | Payer: PRIVATE HEALTH INSURANCE | Primary: Internal Medicine

## 2021-05-09 DIAGNOSIS — Z5181 Encounter for therapeutic drug level monitoring: Secondary | ICD-10-CM

## 2021-05-13 ENCOUNTER — Inpatient Hospital Stay: Admit: 2021-05-13 | Discharge: 2021-05-13 | Payer: PRIVATE HEALTH INSURANCE | Primary: Internal Medicine

## 2021-05-13 ENCOUNTER — Ambulatory Visit: Admit: 2021-05-13 | Payer: PRIVATE HEALTH INSURANCE | Primary: Internal Medicine

## 2021-05-13 DIAGNOSIS — K559 Vascular disorder of intestine, unspecified: Secondary | ICD-10-CM

## 2021-05-13 DIAGNOSIS — Z5181 Encounter for therapeutic drug level monitoring: Secondary | ICD-10-CM

## 2021-05-13 DIAGNOSIS — D6862 Lupus anticoagulant syndrome: Secondary | ICD-10-CM

## 2021-05-13 DIAGNOSIS — Z7901 Long term (current) use of anticoagulants: Secondary | ICD-10-CM

## 2021-05-13 LAB — PROTIME AND INR
BKR INR: 4.6 — ABNORMAL HIGH (ref 0.92–1.19)
BKR PROTHROMBIN TIME: 43.9 seconds — ABNORMAL HIGH (ref 9.6–12.3)

## 2021-05-13 NOTE — Progress Notes
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  The patient was contacted regarding lab drawn INR results.  Patient's INR findings and prescribed dose from last visit:  04/14/2021 04/16/2021 05/13/2021 Anticoagulation Monitoring INR (External)Latest Ref Range & Units: 0.8 - 1.15  2.30        Assoc. INR Date  04/14/2021 05/13/2021 Associated INR  2.30 4.60 Instructions  5 mg every day; Starting 04/16/2021 1/17: Hold; Otherwise 2.5 mg every Thu; 5 mg all other days; Starting 05/13/2021 Sunday Dose  5 mg 5 mg Monday Dose  - 5 mg Tuesday Dose  - Hold (1/17) Wednesday Dose  5 mg 5 mg Thursday Dose  5 mg 2.5 mg Friday Dose  5 mg 5 mg Saturday Dose  5 mg 5 mg Last 7 day dose total  20 mg 35 mg Next INR Date  04/21/2021    This result is from an external source.  Multiple values from one day are sorted in reverse-chronological order The following findings were reported from today's call (no findings if none selected):[]  Missed doses []  Extra doses []  Change in medications []  Change in vitamin k rich food intake (green vegetables, herbals, etc) []  Change in alcohol use []  Change in overall health []  Recent hospitalization / emergency department visit []  Upcoming procedure (including dental procedures) []  Other Comments:       No findings The following side effects were reported from today's call (no side effects if none selected):[]  Bleeding []  New bruising []  Warfarin-emergency department visit or hospital admission []  Other Comments:   No side effects Assessment/Plan:Patient's INR is 4.6, which is above goal range of 2.0-3.0.  Patient does not report any signs or symptoms of bleeding and knows to go to the ER if she does..  She was instructed to hold tonights dose and decrease weekly dose by 7% as indicated in the maintenance plan below.  Next INR assessment due 1 week. 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  Electronically Signed by [name], RPH, May 13, 2021 [facility] Anticoagulation Clinic  [address] [place], [place] [address]  Phone:[phone]  Fax: [phone]

## 2021-05-23 ENCOUNTER — Encounter: Admit: 2021-05-23 | Payer: PRIVATE HEALTH INSURANCE | Primary: Internal Medicine

## 2021-05-23 DIAGNOSIS — Z5181 Encounter for therapeutic drug level monitoring: Secondary | ICD-10-CM

## 2021-05-26 ENCOUNTER — Inpatient Hospital Stay: Admit: 2021-05-26 | Discharge: 2021-05-26 | Payer: PRIVATE HEALTH INSURANCE | Primary: Internal Medicine

## 2021-05-26 ENCOUNTER — Ambulatory Visit: Admit: 2021-05-26 | Payer: PRIVATE HEALTH INSURANCE | Attending: Ophthalmology | Primary: Internal Medicine

## 2021-05-26 ENCOUNTER — Encounter: Admit: 2021-05-26 | Payer: PRIVATE HEALTH INSURANCE | Attending: Ophthalmology | Primary: Internal Medicine

## 2021-05-26 DIAGNOSIS — Z7901 Long term (current) use of anticoagulants: Secondary | ICD-10-CM

## 2021-05-26 DIAGNOSIS — H04123 Dry eye syndrome of bilateral lacrimal glands: Secondary | ICD-10-CM

## 2021-05-26 DIAGNOSIS — R42 Dizziness and giddiness: Secondary | ICD-10-CM

## 2021-05-26 DIAGNOSIS — D6862 Lupus anticoagulant syndrome: Secondary | ICD-10-CM

## 2021-05-26 DIAGNOSIS — K436 Other and unspecified ventral hernia with obstruction, without gangrene: Secondary | ICD-10-CM

## 2021-05-26 DIAGNOSIS — K55039 Acute (reversible) ischemia of large intestine, extent unspecified: Secondary | ICD-10-CM

## 2021-05-26 DIAGNOSIS — F32A Depression: Secondary | ICD-10-CM

## 2021-05-26 DIAGNOSIS — E7800 Pure hypercholesterolemia, unspecified: Secondary | ICD-10-CM

## 2021-05-26 DIAGNOSIS — L719 Rosacea, unspecified: Secondary | ICD-10-CM

## 2021-05-26 DIAGNOSIS — H1013 Acute atopic conjunctivitis, bilateral: Secondary | ICD-10-CM

## 2021-05-26 DIAGNOSIS — H25813 Combined forms of age-related cataract, bilateral: Secondary | ICD-10-CM

## 2021-05-26 DIAGNOSIS — Z5181 Encounter for therapeutic drug level monitoring: Secondary | ICD-10-CM

## 2021-05-26 DIAGNOSIS — E119 Type 2 diabetes mellitus without complications: Secondary | ICD-10-CM

## 2021-05-26 LAB — PROTIME AND INR
BKR INR: 1.78 — ABNORMAL HIGH (ref 0.92–1.19)
BKR PROTHROMBIN TIME: 17.9 seconds — ABNORMAL HIGH (ref 9.6–12.3)

## 2021-05-26 MED ORDER — OLOPATADINE 0.2 % EYE DROPS
0.2 % | Freq: Every day | OPHTHALMIC | 7 refills | 25.00 days | Status: AC
Start: 2021-05-26 — End: ?
  Filled 2021-05-26: qty 2.5, 25d supply, fill #0

## 2021-05-26 NOTE — Patient Instructions
Try Refresh, Systane, or Blink artificial tears 2-3x/day as needed

## 2021-05-26 NOTE — Progress Notes
Allergic conjunctivitis ou -  on Pataday, discussed using pataday consistently and artificial tears PRN. Don't use Visine. 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 I discussed the case with the resident and agree with the findings and plan as documented in the resident's note.  I personally examined the patient and formulated the plan. I personally discussed with patient and answered all questions.Stable no retinopathyJessica Jermiah [name], MDY[facility] Eye Center1/30/2023 at 12:12 PM

## 2021-05-27 ENCOUNTER — Ambulatory Visit: Admit: 2021-05-27 | Payer: PRIVATE HEALTH INSURANCE | Attending: Gastroenterology | Primary: Internal Medicine

## 2021-05-27 ENCOUNTER — Encounter: Admit: 2021-05-27 | Payer: PRIVATE HEALTH INSURANCE | Attending: Gastroenterology | Primary: Internal Medicine

## 2021-05-27 DIAGNOSIS — D6862 Lupus anticoagulant syndrome: Secondary | ICD-10-CM

## 2021-05-27 DIAGNOSIS — E7800 Pure hypercholesterolemia, unspecified: Secondary | ICD-10-CM

## 2021-05-27 DIAGNOSIS — K559 Vascular disorder of intestine, unspecified: Secondary | ICD-10-CM

## 2021-05-27 DIAGNOSIS — K55039 Acute (reversible) ischemia of large intestine, extent unspecified: Secondary | ICD-10-CM

## 2021-05-27 DIAGNOSIS — F32A Depression: Secondary | ICD-10-CM

## 2021-05-27 DIAGNOSIS — R42 Dizziness and giddiness: Secondary | ICD-10-CM

## 2021-05-27 DIAGNOSIS — E119 Type 2 diabetes mellitus without complications: Secondary | ICD-10-CM

## 2021-05-27 DIAGNOSIS — K436 Other and unspecified ventral hernia with obstruction, without gangrene: Secondary | ICD-10-CM

## 2021-05-27 DIAGNOSIS — L719 Rosacea, unspecified: Secondary | ICD-10-CM

## 2021-05-27 NOTE — Progress Notes
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  The patient was contacted regarding lab drawn INR results.  Patient's INR findings and prescribed dose from last visit:  04/16/2021 05/13/2021 05/27/2021 Anticoagulation Monitoring Assoc. INR Date 04/14/2021 05/13/2021 05/26/2021 Associated INR 2.30 4.60 1.78 Instructions 5 mg every day; Starting 04/16/2021 1/17: Hold; Otherwise 2.5 mg every Thu; 5 mg all other days; Starting 05/13/2021 2.5 mg every Thu; 5 mg all other days; Starting 05/27/2021 Sunday Dose 5 mg 5 mg 5 mg Monday Dose - 5 mg - Tuesday Dose - Hold (1/17) 5 mg Wednesday Dose 5 mg 5 mg 5 mg Thursday Dose 5 mg 2.5 mg 2.5 mg Friday Dose 5 mg 5 mg 5 mg Saturday Dose 5 mg 5 mg 5 mg Last 7 day dose total 20 mg 35 mg 32.5 mg Next INR Date 04/21/2021 05/20/2021 06/02/2021   Multiple values from one day are sorted in reverse-chronological order The following findings were reported from today's call (no findings if none selected):[]  Missed doses []  Extra doses []  Change in medications []  Change in vitamin k rich food intake (green vegetables, herbals, etc) []  Change in alcohol use []  Change in overall health []  Recent hospitalization / emergency department visit []  Upcoming procedure (including dental procedures) []  Other Comments:       No findings The following side effects were reported from today's call (no side effects if none selected):[]  Bleeding []  New bruising []  Warfarin-emergency department visit or hospital admission []  Other Comments:   No side effects Assessment/Plan:Patient's INR is 1.78, (~1.8)which is below goal range of 2.0-3.0.    She was instructed to continue current weekly dose as indicated in the maintenance plan below.  Next INR assessment due 1 week.  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  Electronically Signed by Robin Searing, PharmD, May 27, 2021 [facility] Anticoagulation Clinic  [address] [place], [place] [address]  Phone:[phone]  Fax: [phone]

## 2021-06-06 ENCOUNTER — Encounter: Admit: 2021-06-06 | Payer: PRIVATE HEALTH INSURANCE | Primary: Internal Medicine

## 2021-06-06 DIAGNOSIS — Z5181 Encounter for therapeutic drug level monitoring: Secondary | ICD-10-CM

## 2021-06-10 ENCOUNTER — Inpatient Hospital Stay: Admit: 2021-06-10 | Discharge: 2021-06-10 | Payer: PRIVATE HEALTH INSURANCE | Primary: Internal Medicine

## 2021-06-10 ENCOUNTER — Ambulatory Visit: Admit: 2021-06-10 | Payer: PRIVATE HEALTH INSURANCE | Primary: Internal Medicine

## 2021-06-10 ENCOUNTER — Encounter: Admit: 2021-06-10 | Payer: PRIVATE HEALTH INSURANCE | Primary: Internal Medicine

## 2021-06-10 DIAGNOSIS — Z5181 Encounter for therapeutic drug level monitoring: Secondary | ICD-10-CM

## 2021-06-10 DIAGNOSIS — L719 Rosacea, unspecified: Secondary | ICD-10-CM

## 2021-06-10 DIAGNOSIS — Z7901 Long term (current) use of anticoagulants: Secondary | ICD-10-CM

## 2021-06-10 DIAGNOSIS — K436 Other and unspecified ventral hernia with obstruction, without gangrene: Secondary | ICD-10-CM

## 2021-06-10 DIAGNOSIS — E119 Type 2 diabetes mellitus without complications: Secondary | ICD-10-CM

## 2021-06-10 DIAGNOSIS — K55039 Acute (reversible) ischemia of large intestine, extent unspecified: Secondary | ICD-10-CM

## 2021-06-10 DIAGNOSIS — E7800 Pure hypercholesterolemia, unspecified: Secondary | ICD-10-CM

## 2021-06-10 DIAGNOSIS — D6862 Lupus anticoagulant syndrome: Secondary | ICD-10-CM

## 2021-06-10 DIAGNOSIS — F32A Depression: Secondary | ICD-10-CM

## 2021-06-10 DIAGNOSIS — K559 Vascular disorder of intestine, unspecified: Secondary | ICD-10-CM

## 2021-06-10 DIAGNOSIS — R42 Dizziness and giddiness: Secondary | ICD-10-CM

## 2021-06-10 LAB — PROTIME AND INR
BKR INR: 2.64 — ABNORMAL HIGH (ref 0.92–1.19)
BKR PROTHROMBIN TIME: 26 seconds — ABNORMAL HIGH (ref 9.6–12.3)

## 2021-06-10 NOTE — Progress Notes
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  The patient was contacted regarding lab drawn INR results.  Patient's INR findings and prescribed dose from last visit:  05/13/2021 05/27/2021 06/10/2021 Anticoagulation Monitoring Assoc. INR Date 05/13/2021 05/26/2021 06/10/2021 Associated INR 4.60 1.78 2.64 Instructions 1/17: Hold; Otherwise 2.5 mg every Thu; 5 mg all other days; Starting 05/13/2021 2.5 mg every Thu; 5 mg all other days; Starting 05/27/2021 2.5 mg every Thu; 5 mg all other days; Starting 06/10/2021 Sunday Dose 5 mg 5 mg 5 mg Monday Dose 5 mg - 5 mg Tuesday Dose Hold (1/17) 5 mg 5 mg Wednesday Dose 5 mg 5 mg 5 mg Thursday Dose 2.5 mg 2.5 mg 2.5 mg Friday Dose 5 mg 5 mg 5 mg Saturday Dose 5 mg 5 mg 5 mg Last 7 day dose total 35 mg 32.5 mg 32.5 mg Next INR Date 05/20/2021 06/02/2021 06/24/2021   Multiple values from one day are sorted in reverse-chronological order Findings from today's call:Unable to assess, left voice message. Side effects reported:Unable to assess, left voice message. Assessment/Plan:Patient's INR is 2.64, which is within goal range of 2.0-3.0.  Patient's chart was reviewed and voice message was left instructing patient to call the clinic if there have been any changes to prescription or over the counter medications or diet, if experiencing a current illness, if there are any planned upcoming invasive/dental procedures or any other changes that could affect warfarin therapy. She was instructed to continue current weekly dose as indicated in the maintenance plan below.  Next INR assessment due 2 weeks.  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  Electronically Signed by [name], RPH, June 10, 2021 [facility] Anticoagulation Clinic  [address], [place], [place] [address]  Phone:[phone]  Fax: [phone]

## 2021-06-18 ENCOUNTER — Encounter: Admit: 2021-06-18 | Payer: PRIVATE HEALTH INSURANCE | Primary: Internal Medicine

## 2021-06-20 ENCOUNTER — Encounter: Admit: 2021-06-20 | Payer: PRIVATE HEALTH INSURANCE | Primary: Internal Medicine

## 2021-06-20 DIAGNOSIS — Z5181 Encounter for therapeutic drug level monitoring: Secondary | ICD-10-CM

## 2021-06-27 ENCOUNTER — Telehealth: Admit: 2021-06-27 | Payer: PRIVATE HEALTH INSURANCE | Attending: Medical Oncology | Primary: Internal Medicine

## 2021-06-27 ENCOUNTER — Ambulatory Visit: Admit: 2021-06-27 | Payer: PRIVATE HEALTH INSURANCE | Primary: Internal Medicine

## 2021-06-27 ENCOUNTER — Ambulatory Visit: Admit: 2021-06-27 | Payer: PRIVATE HEALTH INSURANCE | Attending: Medical Oncology | Primary: Internal Medicine

## 2021-06-27 ENCOUNTER — Inpatient Hospital Stay: Admit: 2021-06-27 | Discharge: 2021-06-27 | Payer: PRIVATE HEALTH INSURANCE | Primary: Internal Medicine

## 2021-06-27 ENCOUNTER — Encounter: Admit: 2021-06-27 | Payer: PRIVATE HEALTH INSURANCE | Primary: Internal Medicine

## 2021-06-27 DIAGNOSIS — R42 Dizziness and giddiness: Secondary | ICD-10-CM

## 2021-06-27 DIAGNOSIS — Z7901 Long term (current) use of anticoagulants: Secondary | ICD-10-CM

## 2021-06-27 DIAGNOSIS — D6862 Lupus anticoagulant syndrome: Secondary | ICD-10-CM

## 2021-06-27 DIAGNOSIS — F32A Depression: Secondary | ICD-10-CM

## 2021-06-27 DIAGNOSIS — K436 Other and unspecified ventral hernia with obstruction, without gangrene: Secondary | ICD-10-CM

## 2021-06-27 DIAGNOSIS — K55039 Acute (reversible) ischemia of large intestine, extent unspecified: Secondary | ICD-10-CM

## 2021-06-27 DIAGNOSIS — Z5181 Encounter for therapeutic drug level monitoring: Secondary | ICD-10-CM

## 2021-06-27 DIAGNOSIS — E7800 Pure hypercholesterolemia, unspecified: Secondary | ICD-10-CM

## 2021-06-27 DIAGNOSIS — K559 Vascular disorder of intestine, unspecified: Secondary | ICD-10-CM

## 2021-06-27 DIAGNOSIS — L719 Rosacea, unspecified: Secondary | ICD-10-CM

## 2021-06-27 DIAGNOSIS — E119 Type 2 diabetes mellitus without complications: Secondary | ICD-10-CM

## 2021-06-27 LAB — PROTIME AND INR
BKR INR: 2.91 seconds — ABNORMAL HIGH (ref 0.92–1.19)
BKR PROTHROMBIN TIME: 28.5 seconds — ABNORMAL HIGH (ref 9.6–12.3)

## 2021-06-27 NOTE — Progress Notes
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  The family (husband Heather Roberts) was contacted regarding lab drawn INR results.  Patient's INR findings and prescribed dose from last visit:  05/27/2021   9:41 AM 06/10/2021   3:37 PM 06/27/2021   4:36 PM Anticoagulation Monitoring Assoc. INR Date 05/26/2021 06/10/2021 06/27/2021 Associated INR 1.78 2.64 2.91 Instructions 2.5 mg every Thu; 5 mg all other days; Starting 05/27/2021 2.5 mg every Thu; 5 mg all other days; Starting 06/10/2021 2.5 mg every Thu; 5 mg all other days; Starting 06/27/2021 Sunday Dose 5 mg 5 mg 5 mg Monday Dose - 5 mg 5 mg Tuesday Dose 5 mg 5 mg 5 mg Wednesday Dose 5 mg 5 mg 5 mg Thursday Dose 2.5 mg 2.5 mg 2.5 mg Friday Dose 5 mg 5 mg 5 mg Saturday Dose 5 mg 5 mg 5 mg Last 7 day dose total 32.5 mg 32.5 mg 32.5 mg Next INR Date 06/02/2021 06/24/2021 07/25/2021 The following findings were reported from today's call (no findings if none selected):[]  Missed doses []  Extra doses []  Change in medications []  Change in vitamin k rich food intake (green vegetables, herbals, etc) []  Change in alcohol use []  Change in overall health []  Recent hospitalization / emergency department visit []  Upcoming procedure (including dental procedures) []  Other Comments:       No findings The following side effects were reported from today's call (no side effects if none selected):[]  Bleeding []  New bruising []  Warfarin-emergency department visit or hospital admission []  Other Comments:   No side effects Assessment/Plan:Patient's INR is 2.91, which is within goal range of 2.0-3.0.  She was instructed to continue current weekly dose as indicated in the maintenance plan below.  Next INR assessment due 4 weeks.  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  Electronically Signed by [name], RPH, June 27, 2021 [facility] Anticoagulation Clinic  [address] [place], [place] [address]  [phone]  Fax: [phone]

## 2021-06-27 NOTE — Telephone Encounter
Received call from patient, she would like a call back from the INR clinic. Please call patient to discuss [phone].

## 2021-08-12 ENCOUNTER — Encounter: Admit: 2021-08-12 | Payer: PRIVATE HEALTH INSURANCE | Attending: Medical | Primary: Internal Medicine

## 2021-08-12 DIAGNOSIS — Z5181 Encounter for therapeutic drug level monitoring: Secondary | ICD-10-CM

## 2021-08-13 ENCOUNTER — Telehealth: Admit: 2021-08-13 | Payer: PRIVATE HEALTH INSURANCE | Attending: Pharmacotherapy | Primary: Internal Medicine

## 2021-08-13 DIAGNOSIS — D6862 Lupus anticoagulant syndrome: Secondary | ICD-10-CM

## 2021-08-13 DIAGNOSIS — Z5181 Encounter for therapeutic drug level monitoring: Secondary | ICD-10-CM

## 2021-08-13 NOTE — Telephone Encounter
I spoke with Maria [name] regarding her overdue INR test and she will go to the  lab on Friday morning, 08/15/21.  She reports no s/s of bleeding/bruising/clotting or changes in diet, medications, health.  She continues to take warfarin as directed. PT/INR orders were entered for the [facility] lab today.

## 2021-08-14 ENCOUNTER — Encounter: Admit: 2021-08-14 | Payer: PRIVATE HEALTH INSURANCE | Primary: Internal Medicine

## 2021-08-14 ENCOUNTER — Ambulatory Visit: Admit: 2021-08-14 | Payer: PRIVATE HEALTH INSURANCE | Primary: Internal Medicine

## 2021-08-14 ENCOUNTER — Inpatient Hospital Stay: Admit: 2021-08-14 | Discharge: 2021-08-14 | Payer: PRIVATE HEALTH INSURANCE | Primary: Internal Medicine

## 2021-08-14 DIAGNOSIS — F32A Depression: Secondary | ICD-10-CM

## 2021-08-14 DIAGNOSIS — K436 Other and unspecified ventral hernia with obstruction, without gangrene: Secondary | ICD-10-CM

## 2021-08-14 DIAGNOSIS — E119 Type 2 diabetes mellitus without complications: Secondary | ICD-10-CM

## 2021-08-14 DIAGNOSIS — K559 Vascular disorder of intestine, unspecified: Secondary | ICD-10-CM

## 2021-08-14 DIAGNOSIS — D6862 Lupus anticoagulant syndrome: Secondary | ICD-10-CM

## 2021-08-14 DIAGNOSIS — Z5181 Encounter for therapeutic drug level monitoring: Secondary | ICD-10-CM

## 2021-08-14 DIAGNOSIS — L719 Rosacea, unspecified: Secondary | ICD-10-CM

## 2021-08-14 DIAGNOSIS — Z7901 Long term (current) use of anticoagulants: Secondary | ICD-10-CM

## 2021-08-14 DIAGNOSIS — E7800 Pure hypercholesterolemia, unspecified: Secondary | ICD-10-CM

## 2021-08-14 DIAGNOSIS — K55039 Acute (reversible) ischemia of large intestine, extent unspecified: Secondary | ICD-10-CM

## 2021-08-14 DIAGNOSIS — R42 Dizziness and giddiness: Secondary | ICD-10-CM

## 2021-08-14 LAB — PROTIME AND INR
BKR INR: 2.77 — ABNORMAL HIGH (ref 0.92–1.19)
BKR PROTHROMBIN TIME: 27.2 s — ABNORMAL HIGH (ref 9.6–12.3)

## 2021-08-14 NOTE — Progress Notes
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  The patient was contacted regarding lab drawn INR results.  Patient's INR findings and prescribed dose from last visit:  06/10/2021   3:37 PM 06/27/2021   4:36 PM 08/14/2021   4:31 PM Anticoagulation Monitoring Assoc. INR Date 06/10/2021 06/27/2021 08/14/2021 Associated INR 2.64 2.91 2.77 Instructions 2.5 mg every Thu; 5 mg all other days; Starting 06/10/2021 2.5 mg every Thu; 5 mg all other days; Starting 06/27/2021 2.5 mg every Thu; 5 mg all other days; Starting 08/14/2021 Sunday Dose 5 mg 5 mg 5 mg Monday Dose 5 mg 5 mg 5 mg Tuesday Dose 5 mg 5 mg 5 mg Wednesday Dose 5 mg 5 mg 5 mg Thursday Dose 2.5 mg 2.5 mg 2.5 mg Friday Dose 5 mg 5 mg 5 mg Saturday Dose 5 mg 5 mg 5 mg Last 7 day dose total 32.5 mg 32.5 mg 32.5 mg Next INR Date 06/24/2021 07/25/2021 09/11/2021 The following findings were reported from today's call (no findings if none selected):[]  Missed doses []  Extra doses []  Change in medications []  Change in vitamin k rich food intake (green vegetables, herbals, etc) []  Change in alcohol use []  Change in overall health []  Recent hospitalization / emergency department visit []  Upcoming procedure (including dental procedures) []  Other Comments:       No findings The following side effects were reported from today's call (no side effects if none selected):[]  Bleeding []  New bruising []  Warfarin-emergency department visit or hospital admission []  Other Comments:   No side effects Assessment/Plan:Patient's INR is 2.77, which is within goal range of 2.0-3.0.  She was instructed to continue current weekly dose as indicated in the maintenance plan below.  Next INR assessment due 4 weeks.  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  Electronically Signed by [name], RPH, August 14, 2021 [facility] Anticoagulation Clinic  [address] [place], [place] [address]  Phone:[phone]  Fax: [phone]

## 2021-08-27 ENCOUNTER — Encounter: Admit: 2021-08-27 | Payer: PRIVATE HEALTH INSURANCE | Attending: Ophthalmology | Primary: Internal Medicine

## 2021-09-10 ENCOUNTER — Ambulatory Visit: Admit: 2021-09-10 | Payer: PRIVATE HEALTH INSURANCE | Attending: Surgery of the Hand | Primary: Internal Medicine

## 2021-09-10 ENCOUNTER — Encounter: Admit: 2021-09-10 | Payer: PRIVATE HEALTH INSURANCE | Attending: Surgery of the Hand | Primary: Internal Medicine

## 2021-09-10 DIAGNOSIS — G5630 Lesion of radial nerve, unspecified upper limb: Secondary | ICD-10-CM

## 2021-09-10 DIAGNOSIS — R42 Dizziness and giddiness: Secondary | ICD-10-CM

## 2021-09-10 DIAGNOSIS — E7800 Pure hypercholesterolemia, unspecified: Secondary | ICD-10-CM

## 2021-09-10 DIAGNOSIS — F32A Depression: Secondary | ICD-10-CM

## 2021-09-10 DIAGNOSIS — L719 Rosacea, unspecified: Secondary | ICD-10-CM

## 2021-09-10 DIAGNOSIS — K55039 Acute (reversible) ischemia of large intestine, extent unspecified: Secondary | ICD-10-CM

## 2021-09-10 DIAGNOSIS — K436 Other and unspecified ventral hernia with obstruction, without gangrene: Secondary | ICD-10-CM

## 2021-09-10 DIAGNOSIS — D6862 Lupus anticoagulant syndrome: Secondary | ICD-10-CM

## 2021-09-10 DIAGNOSIS — E119 Type 2 diabetes mellitus without complications: Secondary | ICD-10-CM

## 2021-09-10 NOTE — Progress Notes
KZ:SWFUX pain, left. S/p trigger finger release, right ring. NAT:FTDDU Maria [name] is a  71 y.o. female who is status post right ring trigger finger release, DOS: 01/13/2021.The patient has a new complaint of tingling from her left wrist to her shoulder. She reports that it feels like ants are crawling up her arm. The patient states that her symptoms began approximately 1 month ago. The patient denies trauma to the area. She does wrap her wrist when she is gardening. She wears her watch on her left wrist.  Review of Systems Constitutional: Negative for chills and fever. Skin: Negative for itching and rash. Neurological: Positive for tingling (Left wrist, radiating to shoulder. ). All other systems reviewed and are negative.Occupational History ? Not on file Allergies:She is allergic to morphine; biaxin  [clarithromycin]; and latex, natural rubber.Medications:She has a current medication list which includes the following prescription(s): acetaminophen (TYLENOL) 500 mg tablet - Take 1 tablet (500 mg total) by mouth every 4 (four) hours as needed, biotin 1 mg tablet - Take 1 tablet (1,000 mcg total) by mouth daily, cholecalciferol, vitamin D3, 125 mcg (5,000 unit) tablet - Take 1 tablet (5,000 Units total) by mouth every other day, diphenhydramine HCl (BENADRYL ALLERGY ORAL) - Take 1 tablet by mouth as needed, enoxaparin (LOVENOX) 100 mg/mL syringe - Inject 1 mL (100 mg total) under the skin daily, gabapentin (NEURONTIN) 100 mg capsule - Take 1 capsule (100 mg total) by mouth at bedtime, JARDIANCE 25 mg tablet - 1 TABLET BY MOUTH ONCE DAILY IN THE MORNING FOR 30 DAYS, meclizine (ANTIVERT) 12.5 mg tablet - Take 1 tablet (12.5 mg total) by mouth daily as needed for Dizziness, melatonin 10 mg Tab - Take 10 mg by mouth nightly as needed, multivitamin tablet - Take 1 tablet by mouth daily, olopatadine (PATADAY) 0.2 % ophthalmic solution - Place 1 drop into both eyes daily, ondansetron (ZOFRAN) 4 MG tablet - Take 1 tablet (4 mg total) by mouth every 8 (eight) hours as needed for nausea, phytonadione, vit K1, (VITAMIN K) 100 mcg tablet - Take 1 tablet (100 mcg total) by mouth daily, polyethylene glycol (MIRALAX) 17 gram packet - Take 1 packet (17 g total) by mouth daily. Mix in 8 ounces of water, juice, soda, coffee or tea prior to taking, prasterone, dhea,-calcium carb (DHEA) 10 mg-47 mg calcium Tab - Take by mouth, pravastatin (PRAVACHOL) 40 MG tablet - Take 1.5 tablets (60 mg total) by mouth nightly, senna (SENOKOT) 8.6 mg tablet - Take 2 tablets by mouth nightly., sitaGLIPtin (JANUVIA) 100 MG tablet - Take 1 tablet (100 mg total) by mouth daily, venlafaxine (EFFEXOR XR) 150 mg XR 24 hr extended release capsule - Take 1 capsule (150 mg total) by mouth daily, and warfarin (COUMADIN) 5 mg tablet - Take 1 tablet (5 mg total) by mouth daily. 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 Social History:She  reports that she has never smoked. She has never used smokeless tobacco. She reports that she does not currently use alcohol. She reports that she does not use drugs.Family  History:Her family history includes Bladder cancer in her mother; Breast cancer in her cousin; Colon cancer in her cousin, father, and paternal aunt. Exam: Constitutional: Ladean is in no distress.Skin: Warm and dry.Neck: normal Bilateral hands: She has tingling in the sensory branch of the radial nerve distribution of the left wrist. Tinel's sign is mildly positive over the radial sensory branch. No tenderness. Finkelstein's test is negative. Data Reviewed:previous medical recordsDiagnosis:1. 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 I advised her to avoid any tight wraps to the left wrist area. It does not respond well to surgery, and should resolve after avoiding any compression in the area.  It could take 1-2 months.Follow-Up: Patient will follow up with me PRN. Scribed for Rande Brunt, MD by Jodie Echevaria, medical scribe May 17, 2023The documentation recorded by the scribe accurately reflects the services I personally performed and the decisions made by me. I reviewed and confirmed all material entered and/or pre-charted by the scribe.

## 2021-09-23 ENCOUNTER — Ambulatory Visit: Admit: 2021-09-23 | Payer: PRIVATE HEALTH INSURANCE | Attending: Foot & Ankle Surgery | Primary: Internal Medicine

## 2021-09-26 ENCOUNTER — Telehealth: Admit: 2021-09-26 | Payer: PRIVATE HEALTH INSURANCE | Primary: Internal Medicine

## 2021-09-26 NOTE — Telephone Encounter
INR REMINDER MISSED CALL	I have left a voicemail for [name] on 09/26/2021 at 11:31 AM with a reminder that patient is due for an INR lab test based on the referral for anticoagulation monitoring. This is the first week of INR reminder calls. I have notified the pharmacist and provider as appropriate. Lia Hopping, PharmD6/2/202311:31 AM

## 2021-09-29 ENCOUNTER — Ambulatory Visit: Admit: 2021-09-29 | Payer: PRIVATE HEALTH INSURANCE | Primary: Internal Medicine

## 2021-09-29 ENCOUNTER — Inpatient Hospital Stay: Admit: 2021-09-29 | Discharge: 2021-09-29 | Payer: PRIVATE HEALTH INSURANCE | Primary: Internal Medicine

## 2021-09-29 ENCOUNTER — Encounter: Admit: 2021-09-29 | Payer: PRIVATE HEALTH INSURANCE | Primary: Internal Medicine

## 2021-09-29 ENCOUNTER — Ambulatory Visit: Admit: 2021-09-29 | Payer: PRIVATE HEALTH INSURANCE | Attending: Medical Oncology | Primary: Internal Medicine

## 2021-09-29 DIAGNOSIS — K559 Vascular disorder of intestine, unspecified: Secondary | ICD-10-CM

## 2021-09-29 DIAGNOSIS — E7800 Pure hypercholesterolemia, unspecified: Secondary | ICD-10-CM

## 2021-09-29 DIAGNOSIS — L719 Rosacea, unspecified: Secondary | ICD-10-CM

## 2021-09-29 DIAGNOSIS — Z5181 Encounter for therapeutic drug level monitoring: Secondary | ICD-10-CM

## 2021-09-29 DIAGNOSIS — E119 Type 2 diabetes mellitus without complications: Secondary | ICD-10-CM

## 2021-09-29 DIAGNOSIS — Z7901 Long term (current) use of anticoagulants: Secondary | ICD-10-CM

## 2021-09-29 DIAGNOSIS — F32A Depression: Secondary | ICD-10-CM

## 2021-09-29 DIAGNOSIS — R42 Dizziness and giddiness: Secondary | ICD-10-CM

## 2021-09-29 DIAGNOSIS — D6862 Lupus anticoagulant syndrome: Secondary | ICD-10-CM

## 2021-09-29 DIAGNOSIS — K55039 Acute (reversible) ischemia of large intestine, extent unspecified: Secondary | ICD-10-CM

## 2021-09-29 DIAGNOSIS — K436 Other and unspecified ventral hernia with obstruction, without gangrene: Secondary | ICD-10-CM

## 2021-09-29 LAB — PROTIME AND INR
BKR INR: 2.81 — ABNORMAL HIGH (ref 0.92–1.19)
BKR PROTHROMBIN TIME: 27.6 s — ABNORMAL HIGH (ref 9.6–12.3)

## 2021-09-29 NOTE — Progress Notes
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  The patient was contacted regarding lab drawn INR results.  Patient's INR findings and prescribed dose from last visit:  06/27/2021   4:36 PM 08/14/2021   4:31 PM 09/29/2021   3:39 PM [facility] Monitoring [facility]oc[facility] INR Date 06/27/2021 08/14/2021 09/29/2021 Associated INR 2.91 2.77 2.81 Instructions 2.5 mg every Thu; 5 mg all other days; Starting 06/27/2021 2.5 mg every Thu; 5 mg all other days; Starting 08/14/2021 2.5 mg every Thu; 5 mg all other days; Starting 09/29/2021 Sunday Dose 5 mg 5 mg 5 mg Monday Dose 5 mg 5 mg 5 mg Tuesday Dose 5 mg 5 mg 5 mg Wednesday Dose 5 mg 5 mg 5 mg Thursday Dose 2.5 mg 2.5 mg 2.5 mg Friday Dose 5 mg 5 mg 5 mg Saturday Dose 5 mg 5 mg 5 mg Last 7 day dose total 32.5 mg 32.5 mg 32.5 mg Next INR Date 07/25/2021 09/11/2021 10/27/2021 The following findings were reported from today's call (no findings if none selected):[]  Missed doses []  Extra doses []  Change in medications []  Change in vitamin k rich food intake (green vegetables, herbals, etc) []  Change in alcohol use []  Change in overall health []  Recent hospitalization / emergency department visit []  Upcoming procedure (including dental procedures) []  Other Comments:       No findings The following side effects were reported from today's call (no side effects if none selected):[]  Bleeding []  New bruising []  Warfarin-emergency department visit or hospital admission []  Other Comments:   No side effects Assessment/Plan:Patient's INR is 2.81, which is within goal range of 2.0-3.0.  She was instructed to continue current weekly dose as indicated in the maintenance plan below.  Next INR assessment due 4 weeks.  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  Electronically Signed by [name], RPH, September 29, 2021 [facility] Anticoagulation [facility]  [address] [place], [place] [address]  Phone:[phone]  Fax: [phone]

## 2021-10-08 ENCOUNTER — Encounter: Admit: 2021-10-08 | Payer: PRIVATE HEALTH INSURANCE | Attending: Family Medicine | Primary: Internal Medicine

## 2021-10-08 DIAGNOSIS — L987 Excessive and redundant skin and subcutaneous tissue: Secondary | ICD-10-CM

## 2021-10-21 ENCOUNTER — Encounter: Admit: 2021-10-21 | Payer: PRIVATE HEALTH INSURANCE | Attending: Hematology & Oncology | Primary: Internal Medicine

## 2021-10-21 DIAGNOSIS — D6862 Lupus anticoagulant syndrome: Secondary | ICD-10-CM

## 2021-10-22 ENCOUNTER — Telehealth: Admit: 2021-10-22 | Payer: PRIVATE HEALTH INSURANCE | Attending: Hematology & Oncology | Primary: Internal Medicine

## 2021-10-22 ENCOUNTER — Ambulatory Visit: Admit: 2021-10-22 | Payer: PRIVATE HEALTH INSURANCE | Attending: Hematology & Oncology | Primary: Internal Medicine

## 2021-10-22 ENCOUNTER — Ambulatory Visit: Admit: 2021-10-22 | Payer: PRIVATE HEALTH INSURANCE | Primary: Internal Medicine

## 2021-10-22 NOTE — Telephone Encounter
Received call from patient, she would like to cancel her appointment today June 28th with Dr.Martin. Please cancel patients appointment and contact to reschedule [phone]. Routed to PFAS and MD

## 2021-10-22 NOTE — Telephone Encounter
Called left vm about appt change [place] [facility] to see dr Daphine Deutscher

## 2021-10-31 ENCOUNTER — Telehealth: Admit: 2021-10-31 | Payer: PRIVATE HEALTH INSURANCE | Attending: Hematology & Oncology | Primary: Internal Medicine

## 2021-10-31 NOTE — Telephone Encounter
Called and spoke to Maria [name], informed her that the reason she is scheduled in [place] is because Dr. Daphine Deutscher is booked until the end of October in [facility] and we didn't want her to go too long without an appointment. I told Maria [name] that if an appointment becomes available sooner in [facility] then we will call her to schedule. Maria [name] expressed VU and was appreciative of my call.

## 2021-10-31 NOTE — Telephone Encounter
Received call from patient, she has an appointment with Dr.Martin on September 8th, patient would like to know why it is scheduled in the [facility] office when she has been coming to [facility] for years. Please call patient to discuss [phone]

## 2021-11-03 ENCOUNTER — Telehealth: Admit: 2021-11-03 | Payer: PRIVATE HEALTH INSURANCE | Attending: Gastroenterology | Primary: Internal Medicine

## 2021-11-03 NOTE — Telephone Encounter
Contacted Ludmila  And left a voicemail to remind her to get an INR as soon as possible. INR due date 7/3

## 2021-11-04 ENCOUNTER — Ambulatory Visit: Admit: 2021-11-04 | Payer: PRIVATE HEALTH INSURANCE | Attending: Foot & Ankle Surgery | Primary: Internal Medicine

## 2021-11-05 ENCOUNTER — Ambulatory Visit: Admit: 2021-11-05 | Payer: PRIVATE HEALTH INSURANCE | Attending: Hematology & Oncology | Primary: Internal Medicine

## 2021-11-05 ENCOUNTER — Inpatient Hospital Stay: Admit: 2021-11-05 | Discharge: 2021-11-05 | Payer: PRIVATE HEALTH INSURANCE | Primary: Internal Medicine

## 2021-11-05 ENCOUNTER — Encounter: Admit: 2021-11-05 | Payer: PRIVATE HEALTH INSURANCE | Attending: Hematology & Oncology | Primary: Internal Medicine

## 2021-11-05 ENCOUNTER — Ambulatory Visit: Admit: 2021-11-05 | Payer: PRIVATE HEALTH INSURANCE | Primary: Internal Medicine

## 2021-11-05 ENCOUNTER — Encounter: Admit: 2021-11-05 | Payer: PRIVATE HEALTH INSURANCE | Primary: Internal Medicine

## 2021-11-05 DIAGNOSIS — D6862 Lupus anticoagulant syndrome: Secondary | ICD-10-CM

## 2021-11-05 DIAGNOSIS — R42 Dizziness and giddiness: Secondary | ICD-10-CM

## 2021-11-05 DIAGNOSIS — K436 Other and unspecified ventral hernia with obstruction, without gangrene: Secondary | ICD-10-CM

## 2021-11-05 DIAGNOSIS — L719 Rosacea, unspecified: Secondary | ICD-10-CM

## 2021-11-05 DIAGNOSIS — E7800 Pure hypercholesterolemia, unspecified: Secondary | ICD-10-CM

## 2021-11-05 DIAGNOSIS — K55039 Acute (reversible) ischemia of large intestine, extent unspecified: Secondary | ICD-10-CM

## 2021-11-05 DIAGNOSIS — F32A Depression: Secondary | ICD-10-CM

## 2021-11-05 DIAGNOSIS — E119 Type 2 diabetes mellitus without complications: Secondary | ICD-10-CM

## 2021-11-05 DIAGNOSIS — K559 Vascular disorder of intestine, unspecified: Secondary | ICD-10-CM

## 2021-11-05 LAB — CBC WITH AUTO DIFFERENTIAL
BKR ANION GAP: 26.1 pg — ABNORMAL LOW (ref 27.0–33.0)
BKR CALCIUM: 11.7 fL (ref 8.0–12.0)
BKR GLUCOSE: 31.1 g/dL — ABNORMAL HIGH (ref 31.0–36.0)
BKR WAM ABSOLUTE IMMATURE GRANULOCYTES.: 0.03 x 1000/??L (ref 0.00–0.30)
BKR WAM ABSOLUTE LYMPHOCYTE COUNT.: 1.91 x 1000/??L (ref 0.60–3.70)
BKR WAM ABSOLUTE NRBC (2 DEC): 0 x 1000/??L (ref 0.00–1.00)
BKR WAM ANALYZER ANC: 8.78 x 1000/ÂµL — ABNORMAL HIGH (ref 2.00–7.60)
BKR WAM BASOPHIL ABSOLUTE COUNT.: 0.06 x 1000/??L (ref 0.00–1.00)
BKR WAM BASOPHILS: 0.5 % — ABNORMAL HIGH (ref 0.0–1.4)
BKR WAM EOSINOPHIL ABSOLUTE COUNT.: 0.14 x 1000/??L (ref 0.00–1.00)
BKR WAM EOSINOPHILS: 1.2 % (ref 0.0–5.0)
BKR WAM HEMATOCRIT (2 DEC): 44 % — ABNORMAL HIGH (ref 35.00–45.00)
BKR WAM HEMOGLOBIN: 13.7 g/dL (ref 11.7–15.5)
BKR WAM IMMATURE GRANULOCYTES: 0.3 % (ref 0.0–1.0)
BKR WAM LYMPHOCYTES: 16.5 % — ABNORMAL LOW (ref 17.0–50.0)
BKR WAM MCH (PG): 26.1 pg — ABNORMAL LOW (ref 27.0–33.0)
BKR WAM MCHC: 31.1 g/dL (ref 31.0–36.0)
BKR WAM MCV: 83.8 fL — ABNORMAL HIGH (ref 80.0–100.0)
BKR WAM MONOCYTE ABSOLUTE COUNT.: 0.65 x 1000/??L (ref 0.00–1.00)
BKR WAM MONOCYTES: 5.6 % (ref 4.0–12.0)
BKR WAM MPV: 11.7 fL (ref 8.0–12.0)
BKR WAM NEUTROPHILS: 75.9 % — ABNORMAL HIGH (ref 39.0–72.0)
BKR WAM NUCLEATED RED BLOOD CELLS: 0 % (ref 0.0–1.0)
BKR WAM PLATELETS: 338 x1000/??L (ref 150–420)
BKR WAM RDW-CV: 15.3 % — ABNORMAL HIGH (ref 11.0–15.0)
BKR WAM RED BLOOD CELL COUNT.: 5.25 M/??L (ref 4.00–6.00)
BKR WAM WHITE BLOOD CELL COUNT: 11.6 x1000/??L — ABNORMAL HIGH (ref 4.0–11.0)

## 2021-11-05 LAB — COMPREHENSIVE METABOLIC PANEL
BKR A/G RATIO: 1.1 (ref 1.0–2.2)
BKR ALANINE AMINOTRANSFERASE (ALT): 21 U/L (ref 10–35)
BKR ALBUMIN: 4 g/dL (ref 3.6–4.9)
BKR ALKALINE PHOSPHATASE: 136 U/L — ABNORMAL HIGH (ref 9–122)
BKR ASPARTATE AMINOTRANSFERASE (AST): 20 U/L (ref 10–35)
BKR AST/ALT RATIO: 1
BKR BILIRUBIN TOTAL: 0.2 mg/dL (ref <=1.2)
BKR BLOOD UREA NITROGEN: 18 mg/dL (ref 8–23)
BKR BUN / CREAT RATIO: 22.5 (ref 8.0–23.0)
BKR CHLORIDE: 100 mmol/L (ref 98–107)
BKR CO2: 30 mmol/L (ref 20–30)
BKR CREATININE: 0.8 mg/dL (ref 0.40–1.30)
BKR EGFR, CREATININE (CKD-EPI 2021): >60 mL/min/{1.73_m2} (ref >=60)
BKR GLOBULIN: 3.7 g/dL — ABNORMAL HIGH (ref 2.3–3.5)
BKR POTASSIUM: 4.7 mmol/L (ref 3.3–5.3)
BKR PROTEIN TOTAL: 7.7 g/dL — ABNORMAL LOW (ref 6.6–8.7)
BKR SODIUM: 139 mmol/L (ref 136–144)

## 2021-11-05 LAB — PROTIME AND INR
BKR INR: 4.39 — ABNORMAL HIGH (ref 0.91–1.09)
BKR PROTHROMBIN TIME: 42.3 seconds — ABNORMAL HIGH (ref 9.4–11.1)

## 2021-11-05 NOTE — Progress Notes
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  The patient was contacted regarding lab drawn INR results.  Patient's INR findings and prescribed dose from last visit:  08/14/2021   4:31 PM 09/29/2021   3:39 PM 11/05/2021   3:47 PM Anticoagulation Monitoring Assoc. INR Date 08/14/2021 09/29/2021 11/05/2021 Associated INR 2.77 2.81 4.39 Instructions 2.5 mg every Thu; 5 mg all other days; Starting 08/14/2021 2.5 mg every Thu; 5 mg all other days; Starting 09/29/2021 7/12: Hold; Otherwise 2.5 mg every Tue, Thu; 5 mg all other days; Starting 11/05/2021 Sunday Dose 5 mg 5 mg 5 mg Monday Dose 5 mg 5 mg 5 mg Tuesday Dose 5 mg 5 mg 2.5 mg Wednesday Dose 5 mg 5 mg Hold (7/12) Thursday Dose 2.5 mg 2.5 mg 2.5 mg Friday Dose 5 mg 5 mg 5 mg Saturday Dose 5 mg 5 mg 5 mg Last 7 day dose total 32.5 mg 32.5 mg 32.5 mg Next INR Date 09/11/2021 10/27/2021 11/12/2021 The following findings were reported from today's call (no findings if none selected):[]  Missed doses []  Extra doses []  Change in medications []  Change in vitamin k rich food intake (green vegetables, herbals, etc) []  Change in alcohol use []  Change in overall health []  Recent hospitalization / emergency department visit []  Upcoming procedure (including dental procedures) []  Other Comments:       No findings The following side effects were reported from today's call (no side effects if none selected):[]  Bleeding []  New bruising []  Warfarin-emergency department visit or hospital admission []  Other Comments:   No side effects Assessment/Plan:Patient's INR is 4.39, which is above goal range of 2.0-3.0.  She was instructed to hold 1 dose and decrease weekly dose by 8% as indicated in the maintenance plan below.  Next INR assessment due 1 week.  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  Electronically Signed by [name], RPH, November 05, 2021 [facility] Anticoagulation Clinic  [address], [place], [place] [address]  Phone:[phone]  Fax: [phone]

## 2021-11-05 NOTE — Progress Notes
?  Office Visit - Consultation  ?Referring Physician: Dr Charlett Nose for initial consult: + lupus anticoagulant, hx of anticoagulationSubjective History of Present Illness/Interval History  Denies bleeding.  No new medications. Medications: Medications reviewed and reconciledCurrent Outpatient Medications: ?  acetaminophen, Take 1 tablet (500 mg total) by mouth every 4 (four) hours as needed.?  biotin, Take 1 tablet (1,000 mcg total) by mouth daily.?  cholecalciferol (vitamin D3), Take 1 tablet (5,000 Units total) by mouth every other day.?  diphenhydramine HCl (BENADRYL ALLERGY ORAL), Take 1 tablet by mouth as needed.?  Jardiance, 1 TABLET BY MOUTH ONCE DAILY IN THE MORNING FOR 30 DAYS?  meclizine, Take 1 tablet (12.5 mg total) by mouth daily as needed for Dizziness?  melatonin, Take 10 mg by mouth nightly as needed. ?  multivitamin, Take 1 tablet by mouth daily.?  olopatadine, Place 1 drop into both eyes daily.?  ondansetron, Take 1 tablet (4 mg total) by mouth every 8 (eight) hours as needed for nausea.?  vitamin k, Take 1 tablet (100 mcg total) by mouth daily.?  polyethylene glycol, Take 1 packet (17 g total) by mouth daily. Mix in 8 ounces of water, juice, soda, coffee or tea prior to taking.?  pravastatin, Take 1.5 tablets (60 mg total) by mouth nightly.?  senna, Take 2 tablets by mouth nightly..?  SITagliptin phosphate, Take 1 tablet (100 mg total) by mouth daily.?  venlafaxine XR, Take 1 capsule (150 mg total) by mouth daily.?  warfarin, Take 1 tablet (5 mg total) by mouth daily. Or as directed.Allergies:Morphine; Biaxin  [clarithromycin]; and Latex, natural rubberImmunization History Administered Date(s) Administered ? Influenza, high dose seasonal 01/02/2017, 03/17/2018 ? Influenza, high dose, quad, 0.7 mL, preservative free 01/17/2019 ? Influenza, injectable, quadrivalent, preservative free 02/23/2014 ? Influenza, seasonal, injectable 01/17/2021 ? Pneumococcal polysaccharide PPV23 02/23/2014, 12/27/2017 ? Tdap 11/26/2018 Past Medical History Past Surgical History She has a past medical history of Acute ischemic colitis (HC Code) (HC CODE) (HC Code), Depression, Diabetes mellitus (HC Code), Hypercholesteremia, Lupus anticoagulant disorder (HC Code), Rosacea, Ventral hernia with bowel obstruction, and Vertigo.  She has a past surgical history that includes Knee ligament reconstruction (Right, 2004); Laparoscopic Nissen fundoplication; Umbilical hernia repair; Ventral hernia repair; Tubal ligation; THYROID BIOPSY; Colostomy (05/27/13); Sinus surgery; Abdominal adhesion surgery (2016); Blepharoplasty (Bilateral, 05/2016); Colostomy closure; and Hysteroscopy w/ polypectomy (06/18/2020). Social History Family History She reports that she has never smoked. She has never used smokeless tobacco. She reports that she does not currently use alcohol. She reports that she does not use drugs.  Cancer-related family history includes Bladder cancer in her mother; Breast cancer in her maternal cousin; Colon cancer in her father, maternal cousin, and paternal aunt. Herfamily history includes Bladder cancer in her mother; Breast cancer in her maternal cousin; Colon cancer in her father, maternal cousin, and paternal aunt.  Objective Physical Examination: Vitals:  11/05/21 1459 BP: 119/77 Pulse: 86 Resp: 18 Temp: 98.7 ?F (37.1 ?C) TempSrc: Temporal SpO2: 99% Weight: 68.5 kg BMI Readings from Last 3 Encounters: 11/05/21 30.52 kg/m? 09/10/21 30.90 kg/m? 03/05/21 30.30 kg/m?  General: No Acute DistressHEENT: EOMIPulmonary: CTAB; Normal effort; No wheezes/rhonchi/rales Cardiac: normal S1, normal S2, RRR, no M/R/G Abdomen: Soft, non-tender, no HSM, well healed surgical scarsNeuro: CN grossly intact; Moves all 4 extremities Psychiatric: Appropriate, Alert and Oriented x3 Extremities: No EdemaSkin: no petechiae, no purpuraLabs, Imaging, Misc:  Reviewed in [facility] Results Component Value Date  WBC 11.6 (H) 11/05/2021  HGB 13.7 11/05/2021  HCT 44.00 11/05/2021  MCV 83.8 11/05/2021  PLT 338 11/05/2021  ANCANC 8.78 (H) 11/05/2021 Lab Results Component Value Date  NA 139 11/05/2021  K 4.7 11/05/2021  CL 100 11/05/2021  CO2 30 11/05/2021  BUN 18 11/05/2021  CREATININE 0.80 11/05/2021  GLU 155 (H) 11/05/2021  ALBUMIN 4.0 11/05/2021 Lab Results Component Value Date  ALT 21 11/05/2021  AST 20 11/05/2021  ALKPHOS 136 (H) 11/05/2021  BILITOT 0.2 11/05/2021 Orders Placed This Encounter Procedures ? PT/ INR INR 4.39Assessment  Impression and PlanSofia [name] is a 71 y.o.-year-old femaleTransfer of careHx of + lupus anticoagulant, on warfarin, hx of ischemic colitis. Today: INR 4.39Hold coumadin tonightReviewed with patient dosing instructions for next weekRepeat INR 7/19Follow up in hematology clinic 6 months, [facility] All questions answered in detail.  Cherylynn Ridges, MD

## 2021-11-06 ENCOUNTER — Telehealth: Admit: 2021-11-06 | Payer: PRIVATE HEALTH INSURANCE | Attending: Hematology & Oncology | Primary: Internal Medicine

## 2021-11-06 NOTE — Telephone Encounter
Spoke with patient to schedule her 75m follow up with Dr. Daphine Deutscher, Patient confirms date and time of appt.

## 2021-11-18 ENCOUNTER — Inpatient Hospital Stay: Admit: 2021-11-18 | Discharge: 2021-11-18 | Payer: PRIVATE HEALTH INSURANCE | Primary: Internal Medicine

## 2021-11-18 ENCOUNTER — Telehealth: Admit: 2021-11-18 | Payer: PRIVATE HEALTH INSURANCE | Attending: Gastroenterology | Primary: Internal Medicine

## 2021-11-18 DIAGNOSIS — D6862 Lupus anticoagulant syndrome: Secondary | ICD-10-CM

## 2021-11-18 LAB — PROTIME AND INR
BKR INR: 3.05 — ABNORMAL HIGH (ref 0.92–1.19)
BKR PROTHROMBIN TIME: 29.8 s — ABNORMAL HIGH (ref 9.6–12.3)

## 2021-11-18 NOTE — Telephone Encounter
INR REMINDER MISSED CALL	I have left a voicemail for [name] on 11/18/2021 at 3:16 PM with a reminder that patient is due for an INR lab test based on the referral for anticoagulation monitoring. This is the first week of INR reminder calls.  Robin Searing, PharmDAmbulatory Care AssociateSmilow Trumbull Care Center7/25/20233:16 PM

## 2021-11-19 ENCOUNTER — Encounter: Admit: 2021-11-19 | Payer: PRIVATE HEALTH INSURANCE | Attending: Pharmacotherapy | Primary: Internal Medicine

## 2021-11-19 ENCOUNTER — Ambulatory Visit: Admit: 2021-11-19 | Payer: PRIVATE HEALTH INSURANCE | Attending: Pharmacotherapy | Primary: Internal Medicine

## 2021-11-19 DIAGNOSIS — E7800 Pure hypercholesterolemia, unspecified: Secondary | ICD-10-CM

## 2021-11-19 DIAGNOSIS — E119 Type 2 diabetes mellitus without complications: Secondary | ICD-10-CM

## 2021-11-19 DIAGNOSIS — D6862 Lupus anticoagulant syndrome: Secondary | ICD-10-CM

## 2021-11-19 DIAGNOSIS — K436 Other and unspecified ventral hernia with obstruction, without gangrene: Secondary | ICD-10-CM

## 2021-11-19 DIAGNOSIS — R42 Dizziness and giddiness: Secondary | ICD-10-CM

## 2021-11-19 DIAGNOSIS — K55039 Acute (reversible) ischemia of large intestine, extent unspecified: Secondary | ICD-10-CM

## 2021-11-19 DIAGNOSIS — F32A Depression: Secondary | ICD-10-CM

## 2021-11-19 DIAGNOSIS — L719 Rosacea, unspecified: Secondary | ICD-10-CM

## 2021-11-19 DIAGNOSIS — K559 Vascular disorder of intestine, unspecified: Secondary | ICD-10-CM

## 2021-11-19 NOTE — Progress Notes
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  The patient was contacted regarding lab drawn INR results.  Patient's INR findings and prescribed dose from last visit:  09/29/2021   3:39 PM 11/05/2021   3:47 PM 11/19/2021   8:18 AM Anticoagulation Monitoring Assoc. INR Date 09/29/2021 11/05/2021 11/18/2021 Associated INR 2.81 4.39 3.05 Instructions 2.5 mg every Thu; 5 mg all other days; Starting 09/29/2021 7/12: Hold; Otherwise 2.5 mg every Tue, Thu; 5 mg all other days; Starting 11/05/2021 2.5 mg every Tue, Thu; 5 mg all other days Sunday Dose 5 mg 5 mg 5 mg Monday Dose 5 mg 5 mg 5 mg Tuesday Dose 5 mg 2.5 mg 2.5 mg Wednesday Dose 5 mg Hold (7/12) 5 mg Thursday Dose 2.5 mg 2.5 mg 2.5 mg Friday Dose 5 mg 5 mg 5 mg Saturday Dose 5 mg 5 mg 5 mg Last 7 day dose total 32.5 mg 32.5 mg 30 mg Next INR Date 10/27/2021 11/12/2021 12/02/2021 The following findings were reported from today's call (no findings if none selected):[]  Missed doses []  Extra doses []  Change in medications []  Change in vitamin k rich food intake (green vegetables, herbals, etc) []  Change in alcohol use []  Change in overall health []  Recent hospitalization / emergency department visit []  Upcoming procedure (including dental procedures) []  Other Comments:       Unable to assess.  The following side effects were reported from today's call (no side effects if none selected):[]  Bleeding []  New bruising []  Warfarin-emergency department visit or hospital admission []  Other Comments:   Unable to assess.  Assessment/Plan:Patient's INR is 3.05, which is above goal range of 2.0-3.0.  Patient's chart was reviewed and voice message was left instructing patient to call the clinic if there have been any changes to prescription or over the counter medications or diet, if experiencing a current illness, if there are any planned upcoming invasive/dental procedures or any other changes that could affect warfarin therapy.  She was instructed to continue current weekly dose as indicated in the maintenance plan below.  Next INR assessment due 2 weeks.  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  Electronically Signed by Markus Daft, RPH, November 19, 2021 [facility] Anticoagulation Clinic  [address] [place], [place] [address]  Phone:[phone]  Fax: [phone]

## 2021-11-25 ENCOUNTER — Encounter: Admit: 2021-11-25 | Payer: PRIVATE HEALTH INSURANCE | Attending: Foot & Ankle Surgery | Primary: Internal Medicine

## 2021-11-25 ENCOUNTER — Ambulatory Visit: Admit: 2021-11-25 | Payer: PRIVATE HEALTH INSURANCE | Attending: Foot & Ankle Surgery | Primary: Internal Medicine

## 2021-11-25 DIAGNOSIS — K436 Other and unspecified ventral hernia with obstruction, without gangrene: Secondary | ICD-10-CM

## 2021-11-25 DIAGNOSIS — R42 Dizziness and giddiness: Secondary | ICD-10-CM

## 2021-11-25 DIAGNOSIS — E7800 Pure hypercholesterolemia, unspecified: Secondary | ICD-10-CM

## 2021-11-25 DIAGNOSIS — E119 Type 2 diabetes mellitus without complications: Secondary | ICD-10-CM

## 2021-11-25 DIAGNOSIS — L719 Rosacea, unspecified: Secondary | ICD-10-CM

## 2021-11-25 DIAGNOSIS — D6862 Lupus anticoagulant syndrome: Secondary | ICD-10-CM

## 2021-11-25 DIAGNOSIS — E1140 Type 2 diabetes mellitus with diabetic neuropathy, unspecified: Secondary | ICD-10-CM

## 2021-11-25 DIAGNOSIS — K55039 Acute (reversible) ischemia of large intestine, extent unspecified: Secondary | ICD-10-CM

## 2021-11-25 DIAGNOSIS — F32A Depression: Secondary | ICD-10-CM

## 2021-12-03 NOTE — Telephone Encounter
error 

## 2021-12-04 ENCOUNTER — Emergency Department: Admit: 2021-12-04 | Payer: PRIVATE HEALTH INSURANCE | Primary: Internal Medicine

## 2021-12-04 ENCOUNTER — Inpatient Hospital Stay
Admit: 2021-12-04 | Discharge: 2021-12-04 | Payer: PRIVATE HEALTH INSURANCE | Attending: Student in an Organized Health Care Education/Training Program

## 2021-12-04 ENCOUNTER — Inpatient Hospital Stay: Admit: 2021-12-04 | Discharge: 2021-12-04 | Payer: PRIVATE HEALTH INSURANCE | Primary: Internal Medicine

## 2021-12-04 DIAGNOSIS — Z7984 Long term (current) use of oral hypoglycemic drugs: Secondary | ICD-10-CM

## 2021-12-04 DIAGNOSIS — Z91040 Latex allergy status: Secondary | ICD-10-CM

## 2021-12-04 DIAGNOSIS — M1990 Unspecified osteoarthritis, unspecified site: Secondary | ICD-10-CM

## 2021-12-04 DIAGNOSIS — Z79899 Other long term (current) drug therapy: Secondary | ICD-10-CM

## 2021-12-04 DIAGNOSIS — D6862 Lupus anticoagulant syndrome: Secondary | ICD-10-CM

## 2021-12-04 DIAGNOSIS — Z881 Allergy status to other antibiotic agents status: Secondary | ICD-10-CM

## 2021-12-04 DIAGNOSIS — Z7901 Long term (current) use of anticoagulants: Secondary | ICD-10-CM

## 2021-12-04 DIAGNOSIS — M25561 Pain in right knee: Secondary | ICD-10-CM

## 2021-12-04 DIAGNOSIS — Z885 Allergy status to narcotic agent status: Secondary | ICD-10-CM

## 2021-12-04 DIAGNOSIS — R2241 Localized swelling, mass and lump, right lower limb: Secondary | ICD-10-CM

## 2021-12-04 NOTE — Discharge Instructions
Elevate knee as much as possible-ice 20 minutes at a time each hour while awake to reduce swelling and to help with painKnee immobilizer for comfortTylenol 1000 milligrams every 8 hours as needed for pain-Please follow-up with your orthopedic provider for further evaluation

## 2021-12-04 NOTE — ED Notes
12:55 PM Pt presents to ED from walk in triage. Pt reports chronic right knee pain that has worsened the past 3 days. Swelling noted. Denies recent trauma. Reports right knee surgery 2019. Pt denies any other medical issues. Pt walked in to ED with a steady gait. Pt A&Ox4, speaking and full sentences.2:45 PMPt received discharge instructions. Pt verbalized understanding of instructions. Pt walked out of ED with cane provided by ED.

## 2021-12-05 ENCOUNTER — Ambulatory Visit: Admit: 2021-12-05 | Payer: PRIVATE HEALTH INSURANCE | Primary: Internal Medicine

## 2021-12-05 ENCOUNTER — Encounter: Admit: 2021-12-05 | Payer: PRIVATE HEALTH INSURANCE | Primary: Internal Medicine

## 2021-12-05 DIAGNOSIS — L719 Rosacea, unspecified: Secondary | ICD-10-CM

## 2021-12-05 DIAGNOSIS — K559 Vascular disorder of intestine, unspecified: Secondary | ICD-10-CM

## 2021-12-05 DIAGNOSIS — E7800 Pure hypercholesterolemia, unspecified: Secondary | ICD-10-CM

## 2021-12-05 DIAGNOSIS — R42 Dizziness and giddiness: Secondary | ICD-10-CM

## 2021-12-05 DIAGNOSIS — D6862 Lupus anticoagulant syndrome: Secondary | ICD-10-CM

## 2021-12-05 DIAGNOSIS — K436 Other and unspecified ventral hernia with obstruction, without gangrene: Secondary | ICD-10-CM

## 2021-12-05 DIAGNOSIS — E119 Type 2 diabetes mellitus without complications: Secondary | ICD-10-CM

## 2021-12-05 DIAGNOSIS — K55039 Acute (reversible) ischemia of large intestine, extent unspecified: Secondary | ICD-10-CM

## 2021-12-05 DIAGNOSIS — F32A Depression: Secondary | ICD-10-CM

## 2021-12-05 LAB — PROTIME AND INR
BKR INR: 2.15 — ABNORMAL HIGH (ref 0.92–1.19)
BKR PROTHROMBIN TIME: 21.4 seconds — ABNORMAL HIGH (ref 9.6–12.3)

## 2021-12-05 NOTE — Progress Notes
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  The patient was contacted regarding lab drawn INR results.  Patient's INR findings and prescribed dose from last visit:  11/05/2021   3:47 PM 11/19/2021   8:18 AM 12/05/2021  12:49 PM Anticoagulation Monitoring Assoc. INR Date 11/05/2021 11/18/2021 12/04/2021 Associated INR 4.39 3.05 2.15 Instructions 7/12: Hold; Otherwise 2.5 mg every Tue, Thu; 5 mg all other days; Starting 11/05/2021 2.5 mg every Tue, Thu; 5 mg all other days 2.5 mg every Tue, Thu; 5 mg all other days Sunday Dose 5 mg 5 mg 5 mg Monday Dose 5 mg 5 mg 5 mg Tuesday Dose 2.5 mg 2.5 mg 2.5 mg Wednesday Dose Hold (7/12) 5 mg 5 mg Thursday Dose 2.5 mg 2.5 mg 2.5 mg Friday Dose 5 mg 5 mg 5 mg Saturday Dose 5 mg 5 mg 5 mg Last 7 day dose total 32.5 mg 30 mg 30 mg Next INR Date 11/12/2021 12/02/2021 01/01/2022 The following findings were reported from today's call (no findings if none selected):[]  Missed doses []  Extra doses [x]  Change in medications []  Change in vitamin k rich food intake (green vegetables, herbals, etc) []  Change in alcohol use []  Change in overall health [x]  Recent hospitalization / emergency department visit []  Upcoming procedure (including dental procedures) []  Other Comments:       Was seen in the ER for knee pain. Taking Tylenol The following side effects were reported from today's call (no side effects if none selected):[]  Bleeding []  New bruising []  Warfarin-emergency department visit or hospital admission []  Other Comments:   No side effects Assessment/Plan:Patient's INR is 2.15, which is within goal range of 2.0-3.0.  Patient was seen in the ER for knee pain and started Tylenol.  She was instructed to continue current weekly dose as indicated in the maintenance plan below.  Next INR assessment due 4 weeks.  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  Electronically Signed by [name], RPH, December 05, 2021 [facility] Anticoagulation Clinic  [address] [place], [place] [address]  Phone:[phone]  Fax: [phone]

## 2021-12-05 NOTE — ED Provider Notes
Chief Complaint Patient presents with ? Knee Pain   Pt with chronic right knee pain worse over the last 3 days.  states had surgery a long time ago.  States was told she had arthritis and possibly something with her meniscus.  Denies any new injury.  +slight swelling.  States she has had physical therapy in the past.  Ambulatory to triage HPI/PE:71 year old female pmh HLD, colitis, Lupus with chronic right pain that is worse over the last 3 days and significantly swollen today.  She notes that it feels like it will give out backwards when she stands.  She had a surgery number years ago she believes for a meniscus repair.  No new injury.  No fevers or chills.  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 Knee immobilizer, cane, ice, tylenolFollow up with PCP/COS (pt's ortho provider) for further evaluation including possible cortisone injection and outpatient MRI as well as physical therapy referralPt evaluated by Dr. Mikey Bussing as well  Physical ExamED Triage VitalsBP: n/aPulse: n/aPulse from  O2 sat: n/aResp: n/aTemp: n/aTemp src: n/aSpO2: n/a BP 132/67  - Pulse 72  - Temp 98.1 ?F (36.7 ?C) (Oral)  - Resp 16  - Wt 69.1 kg  - SpO2 99%  - BMI 30.77 kg/m? Physical ExamVitals and nursing note reviewed. Constitutional:     General: She is in acute distress (mild with right knee pain and swelling).    Appearance: Normal appearance. HENT:    Head: Normocephalic and atraumatic. Eyes:    General: No scleral icterus. Conjunctiva/sclera: Conjunctivae normal. Cardiovascular:    Rate and Rhythm: Normal rate. Pulmonary:    Effort: Pulmonary effort is normal. No respiratory distress. Musculoskeletal:       General: Swelling and tenderness present.    Cervical back: Normal range of motion and neck supple.    Right knee: Swelling and bony tenderness (mild) present. No erythema or ecchymosis. Decreased range of motion. Tenderness present. No LCL laxity, MCL laxity or ACL laxity. Normal patellar mobility.    Right lower leg: No swelling, tenderness or bony tenderness.      Legs:Skin:   General: Skin is warm and dry.    Findings: No bruising or erythema. Neurological:    General: No focal deficit present.    Mental Status: She is alert and oriented to person, place, and time.    Sensory: No sensory deficit.    Motor: No weakness. Psychiatric:       Mood and Affect: Mood normal.       Behavior: Behavior normal.  ProceduresAttestation/Critical CarePatient Reevaluation: ED Attestation: PA/APRNFace to face evaluation was performed by me in collaboration with the Advanced Practice Provider to assess for significant health threats. I provided a substantive portion of the care of this patient.  I personally performed medically appropriate history and physical exam and the MDM: 71 yo F here with acute on chronic right knee pain. No f/c,n/v, trauma. States has had before but not as severe. Worse when wlaking on it. previosuly been told it was arthritis as well as a meniscal injury.On exam, right anterior knee ttp. No calf ttp. Neg anterior and posterior stress tests. No laxity with valgus or varus stress. Lower exts are symmetric and neurovascularly intact.xr no  acute finCostco Wholesaleings. Ok for dc home. Symptomatic management. Return precautions provided. Rebecka M HoffmanClinical Impressions as of 12/05/21 0807 Pain and swelling of knee, right ED DispositionDischarge Amedeo Gory May, MD08/10/23 1913 Donia Pounds, PA08/11/23 7846

## 2021-12-24 ENCOUNTER — Ambulatory Visit: Admit: 2021-12-24 | Payer: PRIVATE HEALTH INSURANCE | Attending: Hematology & Oncology | Primary: Internal Medicine

## 2021-12-24 ENCOUNTER — Ambulatory Visit: Admit: 2021-12-24 | Payer: PRIVATE HEALTH INSURANCE | Primary: Internal Medicine

## 2021-12-26 NOTE — Progress Notes
[facility] HospitalPod[facility] Surgery	Outpatient Clinic NoteDate: 8/1/2023Patient Name: Maria RiveraMRN: [id number] Subjective: Maria [name] is a 71 y.o. female with PMHx of acute ischemic colitis, DM,lupus, ventral hernia with bowel obstruction presenting for diabetic evaluation. Patient states that her feet are overall well with no pain or open wounds. Patient states that she has intermittent tingling to the tips of the toes. Patient state that he previous ankle sprain has resolved. Patient reports her A1c is a9 and states that she is following a PCP for management. Denies n/v/f/c/sob.Allergies: Morphine; Biaxin  [clarithromycin]; and Latex, natural rubberPMH: PSH: Past Medical History: Diagnosis Date ? Acute ischemic colitis (HC Code) (HC CODE) (HC Code) 2015 ? Depression  ? Diabetes mellitus (HC Code)  ? Hypercholesteremia  ? Lupus anticoagulant disorder (HC Code)  ? Rosacea  ? Ventral hernia with bowel obstruction  ? Vertigo   Past Surgical History: Procedure Laterality Date ? ABDOMINAL ADHESION SURGERY  2016 ? BLEPHAROPLASTY Bilateral 05/2016 ? COLOSTOMY  05/27/13 ? COLOSTOMY CLOSURE   ? HYSTEROSCOPY W/ POLYPECTOMY  06/18/2020 ? KNEE LIGAMENT RECONSTRUCTION Right 2004 ? LAPAROSCOPIC NISSEN FUNDOPLICATION    2000 ? ? SINUS SURGERY   ? THYROID BIOPSY   ? TUBAL LIGATION   ? UMBILICAL HERNIA REPAIR   ? VENTRAL HERNIA REPAIR    Social History: Family History: Social History Tobacco Use ? Smoking status: Never ? Smokeless tobacco: Never Substance Use Topics ? Alcohol use: Not Currently  Family History Problem Relation Age of Onset ? Bladder cancer Mother  ? Colon cancer Paternal Aunt  ? Colon cancer Maternal Cousin       died of colona cancer at age 74 ? Breast cancer Maternal Cousin       2 cousins with breast cancer ? Colon cancer Father   ROS: Constitutional: Negative for chills and fever. HENT: Negative for hearing lossEyes: Negative for blurred visionRespiratory: Negative for cough Cardiovascular: Negative for leg swellingGastrointestinal: Negative for nausea. Genitourinary: Negative for dysuria Musculoskeletal: Negative for myalgias. Skin: Negative for itching and rash. Negative for woundsNeurological: Negative for sensory changesPsychiatric/Behavioral: Negative for memory lossPhysical Exam: Gen: NADPulm: breathing comfortably on RACards: rrAbd: softVascular: DP/PT pulses 2+. Normal CRT to all digits. Foot is warm to touch. Pedal hair growth is present.Neuro: Protective sensation is normalMSK: No gross deformities. No TTP.Derm: Normal texture and turgor. Imaging: No pertinent informationAssessment & Plan: Maria [name] is a 71 y.o. female with  PMHx of acute ischemic colitis, DM,lupus, ventral hernia with bowel obstruction presenting for diabetic evaluation. Patient states that her feet are overall well with no pain or open wounds. Patient states that she has intermittent tingling to the tips of the toes.- Discussed diagnosis and treatment options in detail including tight glucose control- Recommend follow up with PCP for tight glucose control as well as full neurpathy work up to include vitamin b level secondary to GI history. - Patient feet is overall healthy advised patient on important's daily diabetic foot evaluations.- All patients questions were answered to her satisfaction. - RTC 1 yearSeen and discussed with attending, Dr. Franco Nones. Recommendations are subjected to change per attending's addendum. Gaylene Brooks Surgery PGY-1	Electronically Signed by Quay Burow, DPM, November 25, 2021 I saw and evaluated Maria [name] with the resident. I agree with the findings and the plan of care as documented in the resident note.Electronically Signed by Donn Pierini, DPM, November 25, 2021

## 2022-01-01 ENCOUNTER — Ambulatory Visit: Admit: 2022-01-01 | Payer: PRIVATE HEALTH INSURANCE | Primary: Internal Medicine

## 2022-01-01 ENCOUNTER — Inpatient Hospital Stay: Admit: 2022-01-01 | Discharge: 2022-01-01 | Payer: PRIVATE HEALTH INSURANCE | Primary: Internal Medicine

## 2022-01-01 ENCOUNTER — Encounter: Admit: 2022-01-01 | Payer: PRIVATE HEALTH INSURANCE | Primary: Internal Medicine

## 2022-01-01 DIAGNOSIS — E7800 Pure hypercholesterolemia, unspecified: Secondary | ICD-10-CM

## 2022-01-01 DIAGNOSIS — L719 Rosacea, unspecified: Secondary | ICD-10-CM

## 2022-01-01 DIAGNOSIS — F32A Depression: Secondary | ICD-10-CM

## 2022-01-01 DIAGNOSIS — K55039 Acute (reversible) ischemia of large intestine, extent unspecified: Secondary | ICD-10-CM

## 2022-01-01 DIAGNOSIS — K559 Vascular disorder of intestine, unspecified: Secondary | ICD-10-CM

## 2022-01-01 DIAGNOSIS — D6862 Lupus anticoagulant syndrome: Secondary | ICD-10-CM

## 2022-01-01 DIAGNOSIS — E119 Type 2 diabetes mellitus without complications: Secondary | ICD-10-CM

## 2022-01-01 DIAGNOSIS — K436 Other and unspecified ventral hernia with obstruction, without gangrene: Secondary | ICD-10-CM

## 2022-01-01 DIAGNOSIS — R42 Dizziness and giddiness: Secondary | ICD-10-CM

## 2022-01-01 LAB — PROTIME AND INR
BKR INR: 2.09 — ABNORMAL HIGH (ref 0.86–1.12)
BKR PROTHROMBIN TIME: 22.1 seconds — ABNORMAL HIGH (ref 9.6–12.3)

## 2022-01-01 NOTE — Progress Notes
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  The patient was contacted regarding lab drawn INR results.  Patient's INR findings and prescribed dose from last visit:  11/19/2021   8:18 AM 12/05/2021  12:49 PM 01/01/2022   4:11 PM Anticoagulation Monitoring Assoc. INR Date 11/18/2021 12/04/2021 01/01/2022 Associated INR 3.05 2.15 2.09 Instructions 2.5 mg every Tue, Thu; 5 mg all other days 2.5 mg every Tue, Thu; 5 mg all other days 2.5 mg every Tue, Thu; 5 mg all other days Sunday Dose 5 mg 5 mg 5 mg Monday Dose 5 mg 5 mg 5 mg Tuesday Dose 2.5 mg 2.5 mg 2.5 mg Wednesday Dose 5 mg 5 mg 5 mg Thursday Dose 2.5 mg 2.5 mg 2.5 mg Friday Dose 5 mg 5 mg 5 mg Saturday Dose 5 mg 5 mg 5 mg Last 7 day dose total 30 mg 30 mg 30 mg Next INR Date 12/02/2021 01/01/2022 01/29/2022 The following findings were reported from today's call (no findings if none selected):[]  Missed doses []  Extra doses [x]  Change in medications []  Change in vitamin k rich food intake (green vegetables, herbals, etc) []  Change in alcohol use []  Change in overall health []  Recent hospitalization / emergency department visit []  Upcoming procedure (including dental procedures) []  Other Comments:       Started glipizide 4 weeks ago The following side effects were reported from today's call (no side effects if none selected):[]  Bleeding []  New bruising []  Warfarin-emergency department visit or hospital admission []  Other Comments:   No side effects Assessment/Plan:Patient's INR is 2.09, which is within goal range of 2.0-3.0.  Started glipizide 4 weeks ago.  She was instructed to continue current weekly dose as indicated in the maintenance plan below. Next INR assessment due 4 weeks.  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  Electronically Signed by [name], RPH, January 01, 2022 [facility] Anticoagulation Clinic  [address], [place], [place] [address]  Phone:[phone]  Fax: [phone]

## 2022-01-02 ENCOUNTER — Ambulatory Visit: Admit: 2022-01-02 | Payer: PRIVATE HEALTH INSURANCE | Primary: Internal Medicine

## 2022-01-02 ENCOUNTER — Ambulatory Visit: Admit: 2022-01-02 | Payer: PRIVATE HEALTH INSURANCE | Attending: Hematology & Oncology | Primary: Internal Medicine

## 2022-02-03 ENCOUNTER — Inpatient Hospital Stay: Admit: 2022-02-03 | Discharge: 2022-02-03 | Payer: PRIVATE HEALTH INSURANCE | Primary: Internal Medicine

## 2022-02-03 DIAGNOSIS — D6862 Lupus anticoagulant syndrome: Secondary | ICD-10-CM

## 2022-02-03 LAB — PROTIME AND INR
BKR INR: 2.21 — ABNORMAL HIGH (ref 0.86–1.12)
BKR PROTHROMBIN TIME: 23.3 seconds — ABNORMAL HIGH (ref 9.6–12.3)

## 2022-02-04 ENCOUNTER — Encounter: Admit: 2022-02-04 | Payer: PRIVATE HEALTH INSURANCE | Primary: Internal Medicine

## 2022-02-04 ENCOUNTER — Ambulatory Visit: Admit: 2022-02-04 | Payer: PRIVATE HEALTH INSURANCE | Primary: Internal Medicine

## 2022-02-04 DIAGNOSIS — K55039 Acute (reversible) ischemia of large intestine, extent unspecified: Secondary | ICD-10-CM

## 2022-02-04 DIAGNOSIS — E119 Type 2 diabetes mellitus without complications: Secondary | ICD-10-CM

## 2022-02-04 DIAGNOSIS — R42 Dizziness and giddiness: Secondary | ICD-10-CM

## 2022-02-04 DIAGNOSIS — F32A Depression: Secondary | ICD-10-CM

## 2022-02-04 DIAGNOSIS — K559 Vascular disorder of intestine, unspecified: Secondary | ICD-10-CM

## 2022-02-04 DIAGNOSIS — K436 Other and unspecified ventral hernia with obstruction, without gangrene: Secondary | ICD-10-CM

## 2022-02-04 DIAGNOSIS — E7800 Pure hypercholesterolemia, unspecified: Secondary | ICD-10-CM

## 2022-02-04 DIAGNOSIS — D6862 Lupus anticoagulant syndrome: Secondary | ICD-10-CM

## 2022-02-04 DIAGNOSIS — L719 Rosacea, unspecified: Secondary | ICD-10-CM

## 2022-02-04 NOTE — Progress Notes
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  The patient was contacted regarding lab drawn INR results.  Patient's INR findings and prescribed dose from last visit:  12/05/2021  12:49 PM 01/01/2022   4:11 PM 02/04/2022  10:14 AM Anticoagulation Monitoring Assoc. INR Date 12/04/2021 01/01/2022 02/03/2022 Associated INR 2.15 2.09 2.21 Instructions 2.5 mg every Tue, Thu; 5 mg all other days 2.5 mg every Tue, Thu; 5 mg all other days 2.5 mg every Tue, Thu; 5 mg all other days Sunday Dose 5 mg 5 mg 5 mg Monday Dose 5 mg 5 mg 5 mg Tuesday Dose 2.5 mg 2.5 mg 2.5 mg Wednesday Dose 5 mg 5 mg 5 mg Thursday Dose 2.5 mg 2.5 mg 2.5 mg Friday Dose 5 mg 5 mg 5 mg Saturday Dose 5 mg 5 mg 5 mg Last 7 day dose total 30 mg 30 mg 30 mg Next INR Date 01/01/2022 01/29/2022 03/03/2022 Findings from today's call:Unable to assess, left voice message. Side effects reported:Unable to assess, left voice message. Assessment/Plan:Patient's INR is 2.21, which is within goal range of 2.0-3.0.  Patient's chart was reviewed and voice message was left instructing patient to call the clinic if there have been any changes to prescription or over the counter medications or diet, if experiencing a current illness, if there are any planned upcoming invasive/dental procedures or any other changes that could affect warfarin therapy.[name]  She was instructed to continue current weekly dose as indicated in the maintenance plan below.  Next INR assessment due 4 weeks.  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  Electronically Signed by [name], RPH, February 04, 2022 [facility] Anticoagulation Clinic  [address] [place], [place] [address]  Phone: [phone]  Fax: [phone]

## 2022-02-10 ENCOUNTER — Encounter: Admit: 2022-02-10 | Payer: PRIVATE HEALTH INSURANCE | Attending: Medical Oncology | Primary: Internal Medicine

## 2022-02-10 ENCOUNTER — Encounter: Admit: 2022-02-10 | Payer: PRIVATE HEALTH INSURANCE | Primary: Internal Medicine

## 2022-02-10 MED ORDER — WARFARIN 5 MG TABLET
5 mg | ORAL_TABLET | 4 refills | Status: AC
Start: 2022-02-10 — End: ?

## 2022-03-11 ENCOUNTER — Ambulatory Visit: Admit: 2022-03-11 | Payer: PRIVATE HEALTH INSURANCE | Primary: Internal Medicine

## 2022-03-11 ENCOUNTER — Inpatient Hospital Stay: Admit: 2022-03-11 | Discharge: 2022-03-11 | Payer: PRIVATE HEALTH INSURANCE | Primary: Internal Medicine

## 2022-03-11 ENCOUNTER — Encounter: Admit: 2022-03-11 | Payer: PRIVATE HEALTH INSURANCE | Primary: Internal Medicine

## 2022-03-11 DIAGNOSIS — K436 Other and unspecified ventral hernia with obstruction, without gangrene: Secondary | ICD-10-CM

## 2022-03-11 DIAGNOSIS — E119 Type 2 diabetes mellitus without complications: Secondary | ICD-10-CM

## 2022-03-11 DIAGNOSIS — D6862 Lupus anticoagulant syndrome: Secondary | ICD-10-CM

## 2022-03-11 DIAGNOSIS — L719 Rosacea, unspecified: Secondary | ICD-10-CM

## 2022-03-11 DIAGNOSIS — F32A Depression: Secondary | ICD-10-CM

## 2022-03-11 DIAGNOSIS — K559 Vascular disorder of intestine, unspecified: Secondary | ICD-10-CM

## 2022-03-11 DIAGNOSIS — E7800 Pure hypercholesterolemia, unspecified: Secondary | ICD-10-CM

## 2022-03-11 DIAGNOSIS — R42 Dizziness and giddiness: Secondary | ICD-10-CM

## 2022-03-11 DIAGNOSIS — K55039 Acute (reversible) ischemia of large intestine, extent unspecified: Secondary | ICD-10-CM

## 2022-03-11 LAB — PROTIME AND INR
BKR INR: 1.26 — ABNORMAL HIGH (ref 0.86–1.12)
BKR PROTHROMBIN TIME: 13.8 seconds — ABNORMAL HIGH (ref 9.6–12.3)

## 2022-03-12 NOTE — Progress Notes
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  The patient was contacted regarding lab drawn INR results.  Patient's INR findings and prescribed dose from last visit:  01/01/2022   4:11 PM 02/04/2022  10:14 AM 03/11/2022   3:54 PM Anticoagulation Monitoring Assoc. INR Date 01/01/2022 02/03/2022 03/11/2022 Associated INR 2.09 2.21 1.26 Instructions 2.5 mg every Tue, Thu; 5 mg all other days 2.5 mg every Tue, Thu; 5 mg all other days 11/15: 7.5 mg; Otherwise 5 mg every day Sunday Dose 5 mg 5 mg 5 mg Monday Dose 5 mg 5 mg - Tuesday Dose 2.5 mg 2.5 mg - Wednesday Dose 5 mg 5 mg 7.5 mg (11/15) Thursday Dose 2.5 mg 2.5 mg 5 mg Friday Dose 5 mg 5 mg 5 mg Saturday Dose 5 mg 5 mg 5 mg Last 7 day dose total 30 mg 30 mg 30 mg Next INR Date 01/29/2022 03/03/2022 03/16/2022 The following findings were reported from today's call (no findings if none selected):[x]  Missed doses []  Extra doses []  Change in medications []  Change in vitamin k rich food intake (green vegetables, herbals, etc) []  Change in alcohol use []  Change in overall health []  Recent hospitalization / emergency department visit []  Upcoming procedure (including dental procedures) []  Other Comments:       Patient missed 11/13 dose The following side effects were reported from today's call (no side effects if none selected):[]  Bleeding []  New bruising []  Warfarin-emergency department visit or hospital admission []  Other Comments:   No side effects Assessment/Plan:Patient's INR is 1.26, which is below goal range of 2.0-3.0.  Patient missed 11/13 dose. Dr. Daphine Deutscher would like dose escalation without Lovenox bridging.  She was instructed to increase weekly dose by 16% as indicated in the maintenance plan below.  Next INR assessment due  11/20 . 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  Electronically Signed by [name], RPH, March 11, 2022 [facility] Anticoagulation Clinic  [address] [place], [place] [address]  Phone: [phone]  Fax: [phone]

## 2022-03-17 ENCOUNTER — Inpatient Hospital Stay: Admit: 2022-03-17 | Discharge: 2022-03-17 | Payer: PRIVATE HEALTH INSURANCE | Primary: Internal Medicine

## 2022-03-17 DIAGNOSIS — D6862 Lupus anticoagulant syndrome: Secondary | ICD-10-CM

## 2022-03-17 LAB — PROTIME AND INR
BKR INR: 1.73 — ABNORMAL HIGH (ref 0.86–1.12)
BKR PROTHROMBIN TIME: 18.5 seconds — ABNORMAL HIGH (ref 9.6–12.3)

## 2022-03-18 ENCOUNTER — Encounter: Admit: 2022-03-18 | Payer: PRIVATE HEALTH INSURANCE | Primary: Internal Medicine

## 2022-03-18 ENCOUNTER — Ambulatory Visit: Admit: 2022-03-18 | Payer: PRIVATE HEALTH INSURANCE | Primary: Internal Medicine

## 2022-03-18 DIAGNOSIS — K436 Other and unspecified ventral hernia with obstruction, without gangrene: Secondary | ICD-10-CM

## 2022-03-18 DIAGNOSIS — E119 Type 2 diabetes mellitus without complications: Secondary | ICD-10-CM

## 2022-03-18 DIAGNOSIS — K559 Vascular disorder of intestine, unspecified: Secondary | ICD-10-CM

## 2022-03-18 DIAGNOSIS — L719 Rosacea, unspecified: Secondary | ICD-10-CM

## 2022-03-18 DIAGNOSIS — E7800 Pure hypercholesterolemia, unspecified: Secondary | ICD-10-CM

## 2022-03-18 DIAGNOSIS — K55039 Acute (reversible) ischemia of large intestine, extent unspecified: Secondary | ICD-10-CM

## 2022-03-18 DIAGNOSIS — R42 Dizziness and giddiness: Secondary | ICD-10-CM

## 2022-03-18 DIAGNOSIS — D6862 Lupus anticoagulant syndrome: Secondary | ICD-10-CM

## 2022-03-18 DIAGNOSIS — F32A Depression: Secondary | ICD-10-CM

## 2022-03-18 NOTE — Progress Notes
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  The patient was contacted regarding lab drawn INR results.  Patient's INR findings and prescribed dose from last visit:  02/04/2022  10:14 AM 03/11/2022   3:54 PM 03/18/2022  10:15 AM Anticoagulation Monitoring Assoc. INR Date 02/03/2022 03/11/2022 03/17/2022 Associated INR 2.21 1.26 1.73 Instructions 2.5 mg every Tue, Thu; 5 mg all other days 11/15: 7.5 mg; Otherwise 5 mg every day 7.5 mg every Wed; 5 mg all other days Sunday Dose 5 mg 5 mg 5 mg Monday Dose 5 mg - 5 mg Tuesday Dose 2.5 mg - - Wednesday Dose 5 mg 7.5 mg (11/15) 7.5 mg Thursday Dose 2.5 mg 5 mg 5 mg Friday Dose 5 mg 5 mg 5 mg Saturday Dose 5 mg 5 mg 5 mg Last 7 day dose total 30 mg 30 mg 37.5 mg Next INR Date 03/03/2022 03/16/2022 03/24/2022 The following findings were reported from today's call (no findings if none selected):[]  Missed doses []  Extra doses []  Change in medications []  Change in vitamin k rich food intake (green vegetables, herbals, etc) []  Change in alcohol use []  Change in overall health []  Recent hospitalization / emergency department visit []  Upcoming procedure (including dental procedures) []  Other Comments:       No findings The following side effects were reported from today's call (no side effects if none selected):[]  Bleeding []  New bruising []  Warfarin-emergency department visit or hospital admission []  Other Comments:   No side effects Assessment/Plan:Patient's INR is 1.73, which is below goal range of 2.0-3.0.  She was instructed to increase weekly dose by 7% as indicated in the maintenance plan below.  Next INR assessment due 1 week.  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  Electronically Signed by [name], RPH, March 18, 2022 [facility] Anticoagulation Clinic  [address] [place], [place] [address]  Phone: [phone]  Fax: [phone]

## 2022-03-31 ENCOUNTER — Inpatient Hospital Stay: Admit: 2022-03-31 | Discharge: 2022-03-31 | Payer: PRIVATE HEALTH INSURANCE | Primary: Internal Medicine

## 2022-03-31 DIAGNOSIS — D6862 Lupus anticoagulant syndrome: Secondary | ICD-10-CM

## 2022-03-31 LAB — PROTIME AND INR
BKR INR: 2.61 — ABNORMAL HIGH (ref 0.86–1.12)
BKR PROTHROMBIN TIME: 27.2 seconds — ABNORMAL HIGH (ref 9.6–12.3)

## 2022-04-01 ENCOUNTER — Encounter: Admit: 2022-04-01 | Payer: PRIVATE HEALTH INSURANCE | Primary: Internal Medicine

## 2022-04-01 ENCOUNTER — Ambulatory Visit: Admit: 2022-04-01 | Payer: PRIVATE HEALTH INSURANCE | Primary: Internal Medicine

## 2022-04-01 DIAGNOSIS — F32A Depression: Secondary | ICD-10-CM

## 2022-04-01 DIAGNOSIS — D6862 Lupus anticoagulant syndrome: Secondary | ICD-10-CM

## 2022-04-01 DIAGNOSIS — L719 Rosacea, unspecified: Secondary | ICD-10-CM

## 2022-04-01 DIAGNOSIS — K559 Vascular disorder of intestine, unspecified: Secondary | ICD-10-CM

## 2022-04-01 DIAGNOSIS — K436 Other and unspecified ventral hernia with obstruction, without gangrene: Secondary | ICD-10-CM

## 2022-04-01 DIAGNOSIS — R42 Dizziness and giddiness: Secondary | ICD-10-CM

## 2022-04-01 DIAGNOSIS — E7800 Pure hypercholesterolemia, unspecified: Secondary | ICD-10-CM

## 2022-04-01 DIAGNOSIS — E119 Type 2 diabetes mellitus without complications: Secondary | ICD-10-CM

## 2022-04-01 DIAGNOSIS — K55039 Acute (reversible) ischemia of large intestine, extent unspecified: Secondary | ICD-10-CM

## 2022-04-01 NOTE — Progress Notes
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  The patient was contacted regarding lab drawn INR results.  Patient's INR findings and prescribed dose from last visit:  03/11/2022   3:54 PM 03/18/2022  10:15 AM 04/01/2022  12:05 PM Anticoagulation Monitoring Assoc. INR Date 03/11/2022 03/17/2022 03/31/2022 Associated INR 1.26 1.73 2.61 Instructions 11/15: 7.5 mg; Otherwise 5 mg every day 7.5 mg every Wed; 5 mg all other days 7.5 mg every Wed; 5 mg all other days Sunday Dose 5 mg 5 mg 5 mg Monday Dose - 5 mg 5 mg Tuesday Dose - - 5 mg Wednesday Dose 7.5 mg (11/15) 7.5 mg 7.5 mg Thursday Dose 5 mg 5 mg 5 mg Friday Dose 5 mg 5 mg 5 mg Saturday Dose 5 mg 5 mg 5 mg Last 7 day dose total 30 mg 37.5 mg 37.5 mg Next INR Date 03/16/2022 03/24/2022 04/21/2022 The following findings were reported from today's call (no findings if none selected):[]  Missed doses []  Extra doses []  Change in medications []  Change in vitamin k rich food intake (green vegetables, herbals, etc) []  Change in alcohol use []  Change in overall health []  Recent hospitalization / emergency department visit []  Upcoming procedure (including dental procedures) []  Other Comments:       No findings The following side effects were reported from today's call (no side effects if none selected):[]  Bleeding []  New bruising []  Warfarin-emergency department visit or hospital admission []  Other Comments:   No side effects Assessment/Plan:Patient's INR is 2.61, which is within goal range of 2.0-3.0.  Patient is due for next INR check 12/19 but is traveling until 12/26.  She was instructed to continue current weekly dose as indicated in the maintenance plan below.  Next INR assessment due 04/21/22.  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  Electronically Signed by [name], RPH, April 01, 2022 [facility] Anticoagulation Clinic  [address] [place], [place] [address]  Phone: [phone]  Fax: [phone]

## 2022-05-07 ENCOUNTER — Telehealth: Admit: 2022-05-07 | Payer: PRIVATE HEALTH INSURANCE | Attending: Pharmacotherapy | Primary: Internal Medicine

## 2022-05-07 NOTE — Telephone Encounter
INR REMINDER MISSED CALL	I have left a voicemail for [name] on 05/07/2022 at 2:27 PM with a reminder that patient is due for an INR lab test based on the referral for anticoagulation monitoring. This is the first week of INR reminder calls.  Markus Daft, Pharm.D.Ambulatory Care AssociateSmilow Trumbull Care Center1/11/20242:27 PM

## 2022-05-13 ENCOUNTER — Inpatient Hospital Stay: Admit: 2022-05-13 | Discharge: 2022-05-13 | Payer: PRIVATE HEALTH INSURANCE | Primary: Internal Medicine

## 2022-05-13 DIAGNOSIS — D6862 Lupus anticoagulant syndrome: Secondary | ICD-10-CM

## 2022-05-13 LAB — PROTIME AND INR
BKR INR: 2.73 — ABNORMAL HIGH (ref 0.86–1.12)
BKR PROTHROMBIN TIME: 28.4 seconds — ABNORMAL HIGH (ref 9.6–12.3)

## 2022-05-14 ENCOUNTER — Ambulatory Visit: Admit: 2022-05-14 | Payer: PRIVATE HEALTH INSURANCE | Primary: Internal Medicine

## 2022-05-14 ENCOUNTER — Encounter: Admit: 2022-05-14 | Payer: PRIVATE HEALTH INSURANCE | Primary: Internal Medicine

## 2022-05-14 DIAGNOSIS — K55039 Acute (reversible) ischemia of large intestine, extent unspecified: Secondary | ICD-10-CM

## 2022-05-14 DIAGNOSIS — E119 Type 2 diabetes mellitus without complications: Secondary | ICD-10-CM

## 2022-05-14 DIAGNOSIS — R42 Dizziness and giddiness: Secondary | ICD-10-CM

## 2022-05-14 DIAGNOSIS — L719 Rosacea, unspecified: Secondary | ICD-10-CM

## 2022-05-14 DIAGNOSIS — D6862 Lupus anticoagulant syndrome: Secondary | ICD-10-CM

## 2022-05-14 DIAGNOSIS — F32A Depression: Secondary | ICD-10-CM

## 2022-05-14 DIAGNOSIS — K436 Other and unspecified ventral hernia with obstruction, without gangrene: Secondary | ICD-10-CM

## 2022-05-14 DIAGNOSIS — K559 Vascular disorder of intestine, unspecified: Secondary | ICD-10-CM

## 2022-05-14 DIAGNOSIS — E7800 Pure hypercholesterolemia, unspecified: Secondary | ICD-10-CM

## 2022-05-14 NOTE — Progress Notes
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  The patient was contacted regarding lab drawn INR results.  Patient's INR findings and prescribed dose from last visit:  03/18/2022  10:15 AM 04/01/2022  12:05 PM 05/14/2022   9:49 AM [facility] Monitoring [facility] INR Date 03/17/2022 03/31/2022 05/13/2022 Associated INR 1.73 2.61 2.73 Instructions 7.5 mg every Wed; 5 mg all other days 7.5 mg every Wed; 5 mg all other days 7.5 mg every Wed; 5 mg all other days Sunday Dose 5 mg 5 mg 5 mg Monday Dose 5 mg 5 mg 5 mg Tuesday Dose - 5 mg 5 mg Wednesday Dose 7.5 mg 7.5 mg 7.5 mg Thursday Dose 5 mg 5 mg 5 mg Friday Dose 5 mg 5 mg 5 mg Saturday Dose 5 mg 5 mg 5 mg Last 7 day dose total 37.5 mg 37.5 mg 37.5 mg Next INR Date 03/24/2022 04/21/2022 06/10/2022 The following findings were reported from today's call (no findings if none selected):[]  Missed doses []  Extra doses []  Change in medications []  Change in vitamin k rich food intake (green vegetables, herbals, etc) []  Change in alcohol use []  Change in overall health []  Recent hospitalization / emergency department visit []  Upcoming procedure (including dental procedures) []  Other Comments:       No findings The following side effects were reported from today's call (no side effects if none selected):[]  Bleeding []  New bruising []  Warfarin-emergency department visit or hospital admission []  Other Comments:   No side effects Assessment/Plan:Patient's INR is 2.73, which is within goal range of 2.0-3.0.  She was instructed to continue current weekly dose as indicated in the maintenance plan below.  Next INR assessment due 4 weeks.  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  Electronically Signed by [name], RPH, May 14, 2022 [facility] Anticoagulation Clinic  [address] [place], [place] [address]  Phone: [phone]  Fax: [phone]

## 2022-05-15 ENCOUNTER — Ambulatory Visit: Admit: 2022-05-15 | Payer: PRIVATE HEALTH INSURANCE | Primary: Internal Medicine

## 2022-05-15 ENCOUNTER — Ambulatory Visit: Admit: 2022-05-15 | Payer: PRIVATE HEALTH INSURANCE | Attending: Hematology & Oncology | Primary: Internal Medicine

## 2022-05-15 ENCOUNTER — Ambulatory Visit: Admit: 2022-05-15 | Payer: PRIVATE HEALTH INSURANCE | Attending: Oncology | Primary: Internal Medicine

## 2022-05-15 DIAGNOSIS — D6862 Lupus anticoagulant syndrome: Secondary | ICD-10-CM

## 2022-05-26 ENCOUNTER — Inpatient Hospital Stay: Admit: 2022-05-26 | Discharge: 2022-05-26 | Payer: PRIVATE HEALTH INSURANCE | Primary: Internal Medicine

## 2022-05-26 ENCOUNTER — Encounter: Admit: 2022-05-26 | Payer: PRIVATE HEALTH INSURANCE | Attending: Oncology | Primary: Internal Medicine

## 2022-05-26 ENCOUNTER — Encounter: Admit: 2022-05-26 | Payer: PRIVATE HEALTH INSURANCE | Primary: Internal Medicine

## 2022-05-26 ENCOUNTER — Ambulatory Visit: Admit: 2022-05-26 | Payer: PRIVATE HEALTH INSURANCE | Attending: Oncology | Primary: Internal Medicine

## 2022-05-26 ENCOUNTER — Ambulatory Visit: Admit: 2022-05-26 | Payer: PRIVATE HEALTH INSURANCE | Primary: Internal Medicine

## 2022-05-26 DIAGNOSIS — K559 Vascular disorder of intestine, unspecified: Secondary | ICD-10-CM

## 2022-05-26 DIAGNOSIS — L719 Rosacea, unspecified: Secondary | ICD-10-CM

## 2022-05-26 DIAGNOSIS — K55039 Acute (reversible) ischemia of large intestine, extent unspecified: Secondary | ICD-10-CM

## 2022-05-26 DIAGNOSIS — K436 Other and unspecified ventral hernia with obstruction, without gangrene: Secondary | ICD-10-CM

## 2022-05-26 DIAGNOSIS — D6862 Lupus anticoagulant syndrome: Secondary | ICD-10-CM

## 2022-05-26 DIAGNOSIS — R42 Dizziness and giddiness: Secondary | ICD-10-CM

## 2022-05-26 DIAGNOSIS — Z5181 Encounter for therapeutic drug level monitoring: Secondary | ICD-10-CM

## 2022-05-26 DIAGNOSIS — E7800 Pure hypercholesterolemia, unspecified: Secondary | ICD-10-CM

## 2022-05-26 DIAGNOSIS — E119 Type 2 diabetes mellitus without complications: Secondary | ICD-10-CM

## 2022-05-26 DIAGNOSIS — F32A Depression: Secondary | ICD-10-CM

## 2022-05-26 LAB — COMPREHENSIVE METABOLIC PANEL
BKR A/G RATIO: 1.1 (ref 1.0–2.2)
BKR ALANINE AMINOTRANSFERASE (ALT): 21 U/L (ref 10–35)
BKR ALBUMIN: 3.8 g/dL (ref 3.6–4.9)
BKR ALKALINE PHOSPHATASE: 140 U/L — ABNORMAL HIGH (ref 9–122)
BKR ANION GAP: 10 pg (ref 7–17)
BKR ASPARTATE AMINOTRANSFERASE (AST): 21 U/L (ref 10–35)
BKR AST/ALT RATIO: 1
BKR BILIRUBIN TOTAL: 0.2 mg/dL (ref <=1.2)
BKR BLOOD UREA NITROGEN: 21 mg/dL (ref 8–23)
BKR BUN / CREAT RATIO: 23.3 — ABNORMAL HIGH (ref 8.0–23.0)
BKR CALCIUM: 9.6 mg/dL (ref 8.8–10.2)
BKR CHLORIDE: 100 mmol/L (ref 98–107)
BKR CO2: 28 mmol/L (ref 20–30)
BKR CREATININE: 0.9 mg/dL (ref 0.40–1.30)
BKR EGFR, CREATININE (CKD-EPI 2021): >60 mL/min/{1.73_m2} (ref >=60)
BKR GLOBULIN: 3.5 g/dL (ref 2.3–3.5)
BKR GLUCOSE: 193 mg/dL — ABNORMAL HIGH (ref 70–100)
BKR POTASSIUM: 4.6 mmol/L (ref 3.3–5.3)
BKR PROTEIN TOTAL: 7.3 g/dL (ref 6.6–8.7)
BKR SODIUM: 138 mmol/L (ref 136–144)

## 2022-05-26 LAB — CBC WITH AUTO DIFFERENTIAL
BKR WAM ABSOLUTE IMMATURE GRANULOCYTES.: 0.03 x 1000/??L (ref 0.00–0.30)
BKR WAM ABSOLUTE LYMPHOCYTE COUNT.: 1.6 x 1000/??L (ref 0.60–3.70)
BKR WAM ABSOLUTE NRBC (2 DEC): 0 x 1000/??L (ref 0.00–1.00)
BKR WAM ANALYZER ANC: 8.27 x 1000/??L — ABNORMAL HIGH (ref 2.00–7.60)
BKR WAM BASOPHIL ABSOLUTE COUNT.: 0.07 x 1000/??L (ref 0.00–1.00)
BKR WAM BASOPHILS: 0.7 % (ref 0.0–1.4)
BKR WAM EOSINOPHIL ABSOLUTE COUNT.: 0.18 x 1000/??L (ref 0.00–1.00)
BKR WAM EOSINOPHILS: 1.7 % (ref 0.0–5.0)
BKR WAM HEMATOCRIT (2 DEC): 41.6 % (ref 35.00–45.00)
BKR WAM HEMOGLOBIN: 13.2 g/dL (ref 11.7–15.5)
BKR WAM IMMATURE GRANULOCYTES: 0.3 % (ref 0.0–1.0)
BKR WAM LYMPHOCYTES: 15 % — ABNORMAL LOW (ref 17.0–50.0)
BKR WAM MCH (PG): 27.2 pg (ref 27.0–33.0)
BKR WAM MCHC: 31.7 g/dL — ABNORMAL HIGH (ref 31.0–36.0)
BKR WAM MCV: 85.6 fL (ref 80.0–100.0)
BKR WAM MONOCYTE ABSOLUTE COUNT.: 0.55 x 1000/??L (ref 0.00–1.00)
BKR WAM MONOCYTES: 5.1 % (ref 4.0–12.0)
BKR WAM MPV: 11 fL (ref 8.0–12.0)
BKR WAM NEUTROPHILS: 77.2 % — ABNORMAL HIGH (ref 39.0–72.0)
BKR WAM NUCLEATED RED BLOOD CELLS: 0 % (ref 0.0–1.0)
BKR WAM PLATELETS: 292 x1000/ÂµL (ref 150–420)
BKR WAM RDW-CV: 14.8 % (ref 11.0–15.0)
BKR WAM RED BLOOD CELL COUNT.: 4.86 M/ÂµL (ref 4.00–6.00)
BKR WAM WHITE BLOOD CELL COUNT: 10.7 x1000/??L (ref 4.0–11.0)

## 2022-05-26 LAB — PROTIME AND INR
BKR INR: 1.53 — ABNORMAL HIGH (ref 0.87–1.12)
BKR PROTHROMBIN TIME: 16 seconds — ABNORMAL HIGH (ref 9.4–11.9)

## 2022-05-26 NOTE — Progress Notes
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 HPI INTERIM HISTORY:01/30/2024The patient returns for six-month follow-up.She remains at her baseline state of health.  She denies exacerbations of colitis.  She has mild body aches related to winter temperatures.  She is just back from a pleasant trip visiting family in [place].  She would like to eventually relocated to [place].  Recently started a new medication for hypertension.  She continues on Coumadin with fairly stable dosing.  Denies abnormal bleeding.  She has occasional easy bruising.  Denies any swelling, cough, chest pain or shortness of breath.Labs reviewed:  WBC 10.7, RBC 4.86, hemoglobin 13.2, hematocrit 41.6, MCV 85.6, platelets 292, INR 1.5.REVIEW OF SYSTEMS:Review of Systems Constitutional: Negative.  HENT: Negative.  Eyes: Negative.  Respiratory: Negative.  Cardiovascular: Negative.  Gastrointestinal: Negative.  Endocrine: Negative.  Genitourinary: Negative.  Musculoskeletal: Positive for arthralgias. Skin: Negative. Allergic/Immunologic: Negative.  Neurological: Negative.  Hematological: Negative.  Psychiatric/Behavioral: Negative.  The remainder of the 12 point review of systems is otherwise negative.ALLERGIES:Morphine; Biaxin  [clarithromycin]; and Latex, natural rubberCURRENT MEDICATIONS:Current Medications Medication Sig ? acetaminophen (TYLENOL) 500 mg tablet Take 1 tablet (500 mg total) by mouth every 4 (four) hours as needed. ? biotin 1 mg tablet Take 1 tablet (1,000 mcg total) by mouth daily. ? cholecalciferol, vitamin D3, 125 mcg (5,000 unit) tablet Take 1 tablet (5,000 Units total) by mouth every other day. ? diphenhydramine HCl (BENADRYL ALLERGY ORAL) Take 1 tablet by mouth as needed. ? glipiZIDE (GLUCOTROL) 5 mg Immediate Release tablet Take 1 tablet (5 mg total) by mouth daily before breakfast. ? JARDIANCE 25 mg tablet 1 TABLET BY MOUTH ONCE DAILY IN THE MORNING FOR 30 DAYS ? meclizine (ANTIVERT) 12.5 mg tablet Take 1 tablet (12.5 mg total) by mouth daily as needed for Dizziness ? melatonin 10 mg Tab Take 10 mg by mouth nightly as needed.  ? multivitamin tablet Take 1 tablet by mouth daily. ? olopatadine (PATADAY) 0.2 % ophthalmic solution Place 1 drop into both eyes daily. ? ondansetron (ZOFRAN) 4 MG tablet Take 1 tablet (4 mg total) by mouth every 8 (eight) hours as needed for nausea. ? phytonadione, vit K1, (VITAMIN K) 100 mcg tablet Take 1 tablet (100 mcg total) by mouth daily. ? pravastatin (PRAVACHOL) 40 MG tablet Take 1.5 tablets (60 mg total) by mouth nightly. ? senna (SENOKOT) 8.6 mg tablet Take 2 tablets by mouth nightly.. ? sitaGLIPtin (JANUVIA) 100 MG tablet Take 1 tablet (100 mg total) by mouth daily. ? venlafaxine (EFFEXOR XR) 150 mg XR 24 hr extended release capsule Take 1 capsule (150 mg total) by mouth daily. ? warfarin (COUMADIN) 5 mg tablet Take 2.5 mg on Tuesdays and Thursdays and 5 mg all other days of the week or as directed by warfarin clinic PAST MEDICAL HISTORY:  has a past medical history of Acute ischemic colitis (HC Code) (HC CODE) (HC Code) (2015), Depression, Diabetes mellitus (HC  Code), Hypercholesteremia, Lupus anticoagulant disorder (HC Code), Rosacea, Ventral hernia with bowel obstruction, and Vertigo.PAST SURGICAL HISTORY: has a past surgical history that includes Knee ligament reconstruction (Right, 2004); Laparoscopic Nissen fundoplication; Umbilical hernia repair; Ventral hernia repair; Tubal ligation; THYROID BIOPSY; 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 She has never used smokeless tobacco. She reports that she does not currently use alcohol. She reports that she does not use drugs.PHYSICAL EXAM:BP (!) 140/65  - Pulse (!) 93  - Temp 97.6 ?F (36.4 ?C)  - Resp 18  - Wt 71.2 kg  - SpO2 98%  - BMI 31.71 kg/m? PAIN RATING:Pain Score:   0 - No pain Physical ExamConstitutional:     Appearance: Normal appearance. HENT:    Head: Normocephalic and atraumatic.    Nose: Nose normal.    Mouth/Throat:    Mouth: Mucous membranes are moist.    Pharynx: Oropharynx is clear. Eyes:    Conjunctiva/sclera: Conjunctivae normal. Cardiovascular:    Rate and Rhythm: Normal rate. Pulmonary:    Effort: Pulmonary effort is normal. Abdominal:    Palpations: Abdomen is soft. Musculoskeletal:       General: Normal range of motion.    Cervical back: Normal range of motion. Skin:   General: Skin is warm and dry. Neurological:    General: No focal deficit present.    Mental Status: She is alert and oriented to person, place, and time. Psychiatric:       Mood and Affect: Mood normal.       Behavior: Behavior normal.       Thought Content: Thought content normal. ECOG performance status: 0LABS:Results for orders placed or performed in visit on 05/26/22 PT/ INR Result Value Ref Range  Prothrombin Time 16.0 (H) 9.4 - 11.9 seconds  INR 1.53 (H) 0.87 - 1.12 CBC auto differential Result Value Ref Range  WBC 10.7 4.0 - 11.0 x1000/?L  RBC 4.86 4.00 - 6.00 M/?L  Hemoglobin 13.2 11.7 - 15.5 g/dL  Hematocrit 13.24 40.10 - 45.00 %  MCV 85.6 80.0 - 100.0 fL  MCH 27.2 27.0 - 33.0 pg  MCHC 31.7 31.0 - 36.0 g/dL  RDW-CV 27.2 53.6 - 64.4 %  Platelets 292 150 - 420 x1000/?L  MPV 11.0 8.0 - 12.0 fL  Neutrophils 77.2 (H) 39.0 - 72.0 %  Lymphocytes 15.0 (L) 17.0 - 50.0 %  Monocytes 5.1 4.0 - 12.0 %  Eosinophils 1.7 0.0 - 5.0 %  Basophil 0.7 0.0 - 1.4 %  Immature Granulocytes 0.3 0.0 - 1.0 %  nRBC 0.0 0.0 - 1.0 %  ANC(Abs Neutrophil Count) 8.27 (H) 2.00 - 7.60 x 1000/?L  Absolute Lymphocyte Count 1.60 0.60 - 3.70 x 1000/?L  Monocyte Absolute Count 0.55 0.00 - 1.00 x 1000/?L  Eosinophil Absolute Count 0.18 0.00 - 1.00 x 1000/?L  Basophil Absolute Count 0.07 0.00 - 1.00 x 1000/?L  Absolute Immature Granulocyte Count 0.03 0.00 - 0.30 x 1000/?L  Absolute nRBC 0.00 0.00 - 1.00 x 1000/?L *Note: Due to a large number of results and/or encounters for the requested time period, some results have not been displayed. A complete set of results can be found in Results Review. DIAGNOSTICS:No results found.ASSESSMENT:71 y.o. female with history of lupus anticoagulant antibody, maintained on long-term anticoagulation with  Coumadin.  She also has a history of ischemic colitis.Labs reviewed:  WBC 10.7, hemoglobin 13.2, hematocrit 41.6, platelets 292.  INR 1.5.PLAN:- continue long-term anticoagulation with Coumadin as monitored by the Coumadin Clinic.- follow-up in our office in 6 months.- patient reports no scheduled invasive procedures at this time, however she was advised to contact us in the future as needed for bridging instructions.  She has self injected Lovenox in the past.ORDERS PLACED DURING THIS ENCOUNTER:Orders Placed This Encounter Procedures ? CBC auto differential ? Comprehensive metabolic panel ? Return Visit with Labs (peripheral draw); with MD/DO On the day of this patient's encounter, a total of 30 minutes was personally spent by me.  20 minutes of the visit were dedicated to counseling and coordination of care.  The patient reported understanding of the teaching presented and all questions were answered.  10 minutes of non face-to-face time were spent in chart review.SIGNED:Vilda Zollner Helaine Chess MS, ANP-BC, AOCNSmilow Classical and Malignant HematologyYale [facility] HealthPhone [phone] [employer]  [phone]

## 2022-05-26 NOTE — Progress Notes
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  The patient was contacted regarding lab drawn INR results.  Patient's INR findings and prescribed dose from last visit:  04/01/2022  12:05 PM 05/14/2022   9:49 AM 05/26/2022   2:34 PM Anticoagulation Monitoring Assoc. INR Date 03/31/2022 05/13/2022 05/26/2022 Associated INR 2.61 2.73 1.53 Instructions 7.5 mg every Wed; 5 mg all other days 7.5 mg every Wed; 5 mg all other days 7.5 mg every Tue, Thu; 5 mg all other days Sunday Dose 5 mg 5 mg 5 mg Monday Dose 5 mg 5 mg 5 mg Tuesday Dose 5 mg 5 mg 7.5 mg Wednesday Dose 7.5 mg 7.5 mg 5 mg Thursday Dose 5 mg 5 mg 7.5 mg Friday Dose 5 mg 5 mg 5 mg Saturday Dose 5 mg 5 mg 5 mg Last 7 day dose total 37.5 mg 37.5 mg 37.5 mg Next INR Date 04/21/2022 06/10/2022 06/09/2022 The following findings were reported from today's call (no findings if none selected):[]  Missed doses []  Extra doses [x]  Change in medications []  Change in vitamin k rich food intake (green vegetables, herbals, etc) []  Change in alcohol use []  Change in overall health []  Recent hospitalization / emergency department visit []  Upcoming procedure (including dental procedures) []  Other Comments:       Patient started losartan and quetiapine which can lower warfarin's effect The following side effects were reported from today's call (no side effects if none selected):[]  Bleeding []  New bruising []  Warfarin-emergency department visit or hospital admission []  Other Comments:   No side effects Assessment/Plan:Patient's INR is 1.53, which is below goal range of 2.0-3.0.  Patient started losartan and quetiapine which can lower warfarin's effect.  She was instructed to increase weekly dose by 7% as indicated in the maintenance plan below.  Next INR assessment due 2 weeks.  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  Electronically Signed by [name], RPH, May 26, 2022 [facility] Anticoagulation Clinic  [address] [place], [place] [address]  Phone: [phone]  Fax: [phone]

## 2022-05-29 ENCOUNTER — Encounter: Admit: 2022-05-29 | Payer: PRIVATE HEALTH INSURANCE | Primary: Internal Medicine

## 2022-05-29 DIAGNOSIS — Z5181 Encounter for therapeutic drug level monitoring: Secondary | ICD-10-CM

## 2022-05-29 DIAGNOSIS — D6862 Lupus anticoagulant syndrome: Secondary | ICD-10-CM

## 2022-05-29 DIAGNOSIS — K559 Vascular disorder of intestine, unspecified: Secondary | ICD-10-CM

## 2022-06-02 ENCOUNTER — Encounter: Admit: 2022-06-02 | Payer: PRIVATE HEALTH INSURANCE | Attending: Ophthalmology | Primary: Internal Medicine

## 2022-06-05 ENCOUNTER — Encounter: Admit: 2022-06-05 | Payer: PRIVATE HEALTH INSURANCE | Primary: Internal Medicine

## 2022-06-05 DIAGNOSIS — K559 Vascular disorder of intestine, unspecified: Secondary | ICD-10-CM

## 2022-06-05 DIAGNOSIS — D6862 Lupus anticoagulant syndrome: Secondary | ICD-10-CM

## 2022-06-05 DIAGNOSIS — Z5181 Encounter for therapeutic drug level monitoring: Secondary | ICD-10-CM

## 2022-06-12 ENCOUNTER — Encounter: Admit: 2022-06-12 | Payer: PRIVATE HEALTH INSURANCE | Primary: Internal Medicine

## 2022-06-12 DIAGNOSIS — K559 Vascular disorder of intestine, unspecified: Secondary | ICD-10-CM

## 2022-06-12 DIAGNOSIS — Z5181 Encounter for therapeutic drug level monitoring: Secondary | ICD-10-CM

## 2022-06-12 DIAGNOSIS — D6862 Lupus anticoagulant syndrome: Secondary | ICD-10-CM

## 2022-06-15 ENCOUNTER — Telehealth: Admit: 2022-06-15 | Payer: PRIVATE HEALTH INSURANCE | Primary: Internal Medicine

## 2022-06-15 NOTE — Telephone Encounter
PHARMACY NOTE: INR REMINDER MISSED CALL 	I have left a voicemail for Ms. [name] with a reminder that patient is due for an INR lab test based on the referral for anticoagulation monitoring. This is the first week of missed calls. I have also notified the ambulatory care pharmacist   as appropriate.Wynelle Fanny, CPHTAmbulatory Care Pharmacy Technician [facility] Anticoagulation and [facility] Primary CarePharmacy Department2/19/20242:43 PM

## 2022-06-15 NOTE — Telephone Encounter
Patient calling back-- relayed message and she was concerned about the recent snow we've had. She sates she will go for INR this week

## 2022-06-19 ENCOUNTER — Encounter: Admit: 2022-06-19 | Payer: PRIVATE HEALTH INSURANCE | Primary: Internal Medicine

## 2022-06-19 DIAGNOSIS — D6862 Lupus anticoagulant syndrome: Secondary | ICD-10-CM

## 2022-06-19 DIAGNOSIS — Z5181 Encounter for therapeutic drug level monitoring: Secondary | ICD-10-CM

## 2022-06-19 DIAGNOSIS — K559 Vascular disorder of intestine, unspecified: Secondary | ICD-10-CM

## 2022-06-22 ENCOUNTER — Inpatient Hospital Stay: Admit: 2022-06-22 | Discharge: 2022-06-22 | Payer: PRIVATE HEALTH INSURANCE | Primary: Internal Medicine

## 2022-06-22 ENCOUNTER — Ambulatory Visit: Admit: 2022-06-22 | Payer: PRIVATE HEALTH INSURANCE | Primary: Internal Medicine

## 2022-06-22 ENCOUNTER — Encounter: Admit: 2022-06-22 | Payer: PRIVATE HEALTH INSURANCE | Primary: Internal Medicine

## 2022-06-22 DIAGNOSIS — R42 Dizziness and giddiness: Secondary | ICD-10-CM

## 2022-06-22 DIAGNOSIS — D6862 Lupus anticoagulant syndrome: Secondary | ICD-10-CM

## 2022-06-22 DIAGNOSIS — K55039 Acute (reversible) ischemia of large intestine, extent unspecified: Secondary | ICD-10-CM

## 2022-06-22 DIAGNOSIS — K559 Vascular disorder of intestine, unspecified: Secondary | ICD-10-CM

## 2022-06-22 DIAGNOSIS — E119 Type 2 diabetes mellitus without complications: Secondary | ICD-10-CM

## 2022-06-22 DIAGNOSIS — L719 Rosacea, unspecified: Secondary | ICD-10-CM

## 2022-06-22 DIAGNOSIS — K436 Other and unspecified ventral hernia with obstruction, without gangrene: Secondary | ICD-10-CM

## 2022-06-22 DIAGNOSIS — E7800 Pure hypercholesterolemia, unspecified: Secondary | ICD-10-CM

## 2022-06-22 DIAGNOSIS — F32A Depression: Secondary | ICD-10-CM

## 2022-06-22 LAB — PROTIME AND INR
BKR INR: 2.02 — ABNORMAL HIGH (ref 0.86–1.12)
BKR PROTHROMBIN TIME: 21.4 seconds — ABNORMAL HIGH (ref 9.6–12.3)

## 2022-06-22 NOTE — Progress Notes
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  The patient was contacted regarding lab drawn INR results.  Patient's INR findings and prescribed dose from last visit:  05/14/2022   9:49 AM 05/26/2022   2:34 PM 06/22/2022   3:27 PM Anticoagulation Monitoring Assoc. INR Date 05/13/2022 05/26/2022 06/22/2022 Associated INR 2.73 1.53 2.02 Instructions 7.5 mg every Wed; 5 mg all other days 7.5 mg every Tue, Thu; 5 mg all other days 7.5 mg every Tue, Thu; 5 mg all other days Sunday Dose 5 mg 5 mg 5 mg Monday Dose 5 mg 5 mg 5 mg Tuesday Dose 5 mg 7.5 mg 7.5 mg Wednesday Dose 7.5 mg 5 mg 5 mg Thursday Dose 5 mg 7.5 mg 7.5 mg Friday Dose 5 mg 5 mg 5 mg Saturday Dose 5 mg 5 mg 5 mg Last 7 day dose total 37.5 mg 37.5 mg 40 mg Next INR Date 06/10/2022 06/09/2022 07/20/2022 The following findings were reported from today's call (no findings if none selected):[]  Missed doses []  Extra doses []  Change in medications []  Change in vitamin k rich food intake (green vegetables, herbals, etc) []  Change in alcohol use []  Change in overall health []  Recent hospitalization / emergency department visit []  Upcoming procedure (including dental procedures) []  Other Comments:       No findings The following side effects were reported from today's call (no side effects if none selected):[]  Bleeding []  New bruising []  Warfarin-emergency department visit or hospital admission []  Other Comments:   No side effects Assessment/Plan:Patient's INR is 2, which is within goal range of 2.0-3.0.  She was instructed to continue current weekly dose as indicated in the maintenance plan below.  Next INR assessment due 4 weeks.  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  Electronically Signed by [name], RPH, June 22, 2022 [facility] Anticoagulation Clinic  [address] [place], [place] [address]  Phone: [phone]  Fax: [phone]

## 2022-06-26 ENCOUNTER — Encounter: Admit: 2022-06-26 | Payer: PRIVATE HEALTH INSURANCE | Primary: Internal Medicine

## 2022-06-26 DIAGNOSIS — D6862 Lupus anticoagulant syndrome: Secondary | ICD-10-CM

## 2022-06-26 DIAGNOSIS — Z5181 Encounter for therapeutic drug level monitoring: Secondary | ICD-10-CM

## 2022-06-26 DIAGNOSIS — K559 Vascular disorder of intestine, unspecified: Secondary | ICD-10-CM

## 2022-06-29 ENCOUNTER — Encounter: Admit: 2022-06-29 | Payer: PRIVATE HEALTH INSURANCE | Primary: Internal Medicine

## 2022-06-29 DIAGNOSIS — Z5181 Encounter for therapeutic drug level monitoring: Secondary | ICD-10-CM

## 2022-06-30 ENCOUNTER — Encounter: Admit: 2022-06-30 | Payer: PRIVATE HEALTH INSURANCE | Attending: Family Medicine | Primary: Internal Medicine

## 2022-06-30 DIAGNOSIS — Z1239 Encounter for other screening for malignant neoplasm of breast: Secondary | ICD-10-CM

## 2022-07-02 ENCOUNTER — Encounter: Admit: 2022-07-02 | Payer: PRIVATE HEALTH INSURANCE | Primary: Internal Medicine

## 2022-07-02 DIAGNOSIS — Z5181 Encounter for therapeutic drug level monitoring: Secondary | ICD-10-CM

## 2022-07-02 DIAGNOSIS — D6862 Lupus anticoagulant syndrome: Secondary | ICD-10-CM

## 2022-07-03 ENCOUNTER — Encounter: Admit: 2022-07-03 | Payer: PRIVATE HEALTH INSURANCE | Primary: Internal Medicine

## 2022-07-03 DIAGNOSIS — D6862 Lupus anticoagulant syndrome: Secondary | ICD-10-CM

## 2022-07-03 DIAGNOSIS — Z5181 Encounter for therapeutic drug level monitoring: Secondary | ICD-10-CM

## 2022-07-03 DIAGNOSIS — K559 Vascular disorder of intestine, unspecified: Secondary | ICD-10-CM

## 2022-07-10 ENCOUNTER — Encounter: Admit: 2022-07-10 | Payer: PRIVATE HEALTH INSURANCE | Primary: Internal Medicine

## 2022-07-10 DIAGNOSIS — D6862 Lupus anticoagulant syndrome: Secondary | ICD-10-CM

## 2022-07-10 DIAGNOSIS — K559 Vascular disorder of intestine, unspecified: Secondary | ICD-10-CM

## 2022-07-10 DIAGNOSIS — Z5181 Encounter for therapeutic drug level monitoring: Secondary | ICD-10-CM

## 2022-07-17 ENCOUNTER — Encounter: Admit: 2022-07-17 | Payer: PRIVATE HEALTH INSURANCE | Primary: Internal Medicine

## 2022-07-17 DIAGNOSIS — D6862 Lupus anticoagulant syndrome: Secondary | ICD-10-CM

## 2022-07-17 DIAGNOSIS — K559 Vascular disorder of intestine, unspecified: Secondary | ICD-10-CM

## 2022-07-17 DIAGNOSIS — Z5181 Encounter for therapeutic drug level monitoring: Secondary | ICD-10-CM

## 2022-07-24 ENCOUNTER — Encounter: Admit: 2022-07-24 | Payer: PRIVATE HEALTH INSURANCE | Primary: Internal Medicine

## 2022-07-24 ENCOUNTER — Ambulatory Visit: Admit: 2022-07-24 | Payer: PRIVATE HEALTH INSURANCE | Primary: Internal Medicine

## 2022-07-24 ENCOUNTER — Ambulatory Visit: Admit: 2022-07-24 | Payer: PRIVATE HEALTH INSURANCE | Attending: Hematology & Oncology | Primary: Internal Medicine

## 2022-07-24 ENCOUNTER — Inpatient Hospital Stay: Admit: 2022-07-24 | Discharge: 2022-07-24 | Payer: PRIVATE HEALTH INSURANCE | Primary: Internal Medicine

## 2022-07-24 DIAGNOSIS — Z5181 Encounter for therapeutic drug level monitoring: Secondary | ICD-10-CM

## 2022-07-24 DIAGNOSIS — E119 Type 2 diabetes mellitus without complications: Secondary | ICD-10-CM

## 2022-07-24 DIAGNOSIS — K559 Vascular disorder of intestine, unspecified: Secondary | ICD-10-CM

## 2022-07-24 DIAGNOSIS — D6862 Lupus anticoagulant syndrome: Secondary | ICD-10-CM

## 2022-07-24 DIAGNOSIS — K436 Other and unspecified ventral hernia with obstruction, without gangrene: Secondary | ICD-10-CM

## 2022-07-24 DIAGNOSIS — Z7901 Long term (current) use of anticoagulants: Secondary | ICD-10-CM

## 2022-07-24 DIAGNOSIS — F32A Depression: Secondary | ICD-10-CM

## 2022-07-24 DIAGNOSIS — R42 Dizziness and giddiness: Secondary | ICD-10-CM

## 2022-07-24 DIAGNOSIS — K55039 Acute (reversible) ischemia of large intestine, extent unspecified: Secondary | ICD-10-CM

## 2022-07-24 DIAGNOSIS — E7800 Pure hypercholesterolemia, unspecified: Secondary | ICD-10-CM

## 2022-07-24 DIAGNOSIS — L719 Rosacea, unspecified: Secondary | ICD-10-CM

## 2022-07-24 LAB — PROTIME AND INR
BKR INR: 2.77 g/dL — ABNORMAL HIGH (ref 0.86–1.12)
BKR PROTHROMBIN TIME: 28.8 seconds — ABNORMAL HIGH (ref 9.6–12.3)

## 2022-07-24 NOTE — Progress Notes
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  The patient was contacted regarding lab drawn INR results.  Patient's INR findings and prescribed dose from last visit:  05/26/2022   2:34 PM 06/22/2022   3:27 PM 07/24/2022   3:38 PM Anticoagulation Monitoring Assoc. INR Date 05/26/2022 06/22/2022 07/24/2022 Associated INR 1.53 2.02 2.77 Instructions 7.5 mg every Tue, Thu; 5 mg all other days 7.5 mg every Tue, Thu; 5 mg all other days 7.5 mg every Tue, Thu; 5 mg all other days Sunday Dose 5 mg 5 mg 5 mg Monday Dose 5 mg 5 mg 5 mg Tuesday Dose 7.5 mg 7.5 mg 7.5 mg Wednesday Dose 5 mg 5 mg 5 mg Thursday Dose 7.5 mg 7.5 mg 7.5 mg Friday Dose 5 mg 5 mg 5 mg Saturday Dose 5 mg 5 mg 5 mg Last 7 day dose total 37.5 mg 40 mg 40 mg Next INR Date 06/09/2022 07/20/2022 08/21/2022 The following findings were reported from today's call (no findings if none selected):[]  Missed doses []  Extra doses []  Change in medications []  Change in vitamin k rich food intake (green vegetables, herbals, etc) []  Change in alcohol use []  Change in overall health []  Recent hospitalization / emergency department visit []  Upcoming procedure (including dental procedures) []  Other Comments:       No findings The following side effects were reported from today's call (no side effects if none selected):[]  Bleeding []  New bruising []  Warfarin-emergency department visit or hospital admission []  Other Comments:   No side effects Assessment/Plan:Patient's INR is 2.77, which is within goal range of 2.0-3.0.  She was instructed to continue current weekly dose as indicated in the maintenance plan below.  Next INR assessment due 4 weeks. 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  Electronically Signed by [name], RPH, July 24, 2022 [facility] Anticoagulation Clinic  [address] [place], [place] [address]  Phone: [phone]  Fax: [phone]

## 2022-07-31 ENCOUNTER — Encounter: Admit: 2022-07-31 | Payer: PRIVATE HEALTH INSURANCE | Primary: Internal Medicine

## 2022-07-31 DIAGNOSIS — Z5181 Encounter for therapeutic drug level monitoring: Secondary | ICD-10-CM

## 2022-07-31 DIAGNOSIS — D6862 Lupus anticoagulant syndrome: Secondary | ICD-10-CM

## 2022-07-31 DIAGNOSIS — K559 Vascular disorder of intestine, unspecified: Secondary | ICD-10-CM

## 2022-08-07 ENCOUNTER — Encounter: Admit: 2022-08-07 | Payer: PRIVATE HEALTH INSURANCE | Primary: Internal Medicine

## 2022-08-07 DIAGNOSIS — K559 Vascular disorder of intestine, unspecified: Secondary | ICD-10-CM

## 2022-08-07 DIAGNOSIS — Z5181 Encounter for therapeutic drug level monitoring: Secondary | ICD-10-CM

## 2022-08-07 DIAGNOSIS — D6862 Lupus anticoagulant syndrome: Secondary | ICD-10-CM

## 2022-08-14 ENCOUNTER — Encounter: Admit: 2022-08-14 | Payer: PRIVATE HEALTH INSURANCE | Primary: Internal Medicine

## 2022-08-14 DIAGNOSIS — D6862 Lupus anticoagulant syndrome: Secondary | ICD-10-CM

## 2022-08-14 DIAGNOSIS — K559 Vascular disorder of intestine, unspecified: Secondary | ICD-10-CM

## 2022-08-14 DIAGNOSIS — Z5181 Encounter for therapeutic drug level monitoring: Secondary | ICD-10-CM

## 2022-08-21 ENCOUNTER — Encounter: Admit: 2022-08-21 | Payer: PRIVATE HEALTH INSURANCE | Primary: Internal Medicine

## 2022-08-21 DIAGNOSIS — D6862 Lupus anticoagulant syndrome: Secondary | ICD-10-CM

## 2022-08-21 DIAGNOSIS — Z5181 Encounter for therapeutic drug level monitoring: Secondary | ICD-10-CM

## 2022-08-21 DIAGNOSIS — K559 Vascular disorder of intestine, unspecified: Secondary | ICD-10-CM

## 2022-08-24 ENCOUNTER — Encounter: Admit: 2022-08-24 | Payer: PRIVATE HEALTH INSURANCE | Primary: Internal Medicine

## 2022-08-24 DIAGNOSIS — Z5181 Encounter for therapeutic drug level monitoring: Secondary | ICD-10-CM

## 2022-08-28 ENCOUNTER — Encounter: Admit: 2022-08-28 | Payer: PRIVATE HEALTH INSURANCE | Primary: Internal Medicine

## 2022-08-28 DIAGNOSIS — Z5181 Encounter for therapeutic drug level monitoring: Secondary | ICD-10-CM

## 2022-09-01 ENCOUNTER — Inpatient Hospital Stay: Admit: 2022-09-01 | Discharge: 2022-09-01 | Payer: PRIVATE HEALTH INSURANCE | Attending: Emergency Medicine

## 2022-09-01 ENCOUNTER — Encounter: Admit: 2022-09-01 | Payer: PRIVATE HEALTH INSURANCE | Attending: Ophthalmology | Primary: Internal Medicine

## 2022-09-01 ENCOUNTER — Emergency Department: Admit: 2022-09-01 | Payer: PRIVATE HEALTH INSURANCE | Primary: Internal Medicine

## 2022-09-01 DIAGNOSIS — K0889 Other specified disorders of teeth and supporting structures: Secondary | ICD-10-CM

## 2022-09-01 DIAGNOSIS — R220 Localized swelling, mass and lump, head: Secondary | ICD-10-CM

## 2022-09-01 DIAGNOSIS — K115 Sialolithiasis: Secondary | ICD-10-CM

## 2022-09-01 DIAGNOSIS — H9202 Otalgia, left ear: Secondary | ICD-10-CM

## 2022-09-01 LAB — HEPATIC FUNCTION PANEL
BKR A/G RATIO: 1.1 (ref 1.0–2.2)
BKR ALANINE AMINOTRANSFERASE (ALT): 16 U/L (ref 10–35)
BKR ALBUMIN: 3.9 g/dL (ref 3.6–5.1)
BKR ALKALINE PHOSPHATASE: 121 U/L (ref 9–122)
BKR ASPARTATE AMINOTRANSFERASE (AST): 21 U/L (ref 10–35)
BKR AST/ALT RATIO: 1.3
BKR BILIRUBIN DIRECT: <0.2 mg/dL (ref <=0.3)
BKR BILIRUBIN TOTAL: 0.4 mg/dL (ref <=1.2)
BKR GLOBULIN: 3.6 g/dL (ref 2.0–3.9)
BKR PROTEIN TOTAL: 7.5 g/dL (ref 5.9–8.3)
BKR WAM HEMATOCRIT (2 DEC): 3.6 g/dL — ABNORMAL HIGH (ref 2.0–3.9)

## 2022-09-01 LAB — BASIC METABOLIC PANEL
BKR ANION GAP: 9 (ref 7–17)
BKR BLOOD UREA NITROGEN: 14 mg/dL (ref 8–23)
BKR BUN / CREAT RATIO: 15.6 (ref 8.0–23.0)
BKR CALCIUM: 9.2 mg/dL (ref 8.8–10.2)
BKR CHLORIDE: 96 mmol/L — ABNORMAL LOW (ref 98–107)
BKR CO2: 25 mmol/L (ref 20–30)
BKR CREATININE: 0.9 mg/dL — ABNORMAL LOW (ref 0.40–1.30)
BKR EGFR, CREATININE (CKD-EPI 2021): >60 mL/min/{1.73_m2} (ref >=60)
BKR GLUCOSE: 274 mg/dL — ABNORMAL HIGH (ref 70–100)
BKR POTASSIUM: 4.8 mmol/L (ref 3.3–5.3)
BKR SODIUM: 130 mmol/L — ABNORMAL LOW (ref 136–144)

## 2022-09-01 LAB — CBC WITH AUTO DIFFERENTIAL
BKR WAM ABSOLUTE IMMATURE GRANULOCYTES.: 0.06 x 1000/ÂµL (ref 0.00–0.30)
BKR WAM ABSOLUTE LYMPHOCYTE COUNT.: 1.37 x 1000/ÂµL (ref 0.60–3.70)
BKR WAM ABSOLUTE NRBC (2 DEC): 0 x 1000/ÂµL (ref 0.00–1.00)
BKR WAM ANALYZER ANC: 12.72 x 1000/ÂµL — ABNORMAL HIGH (ref 2.00–7.60)
BKR WAM BASOPHIL ABSOLUTE COUNT.: 0.07 x 1000/ÂµL (ref 0.00–1.00)
BKR WAM BASOPHILS: 0.5 % (ref 0.0–1.4)
BKR WAM EOSINOPHIL ABSOLUTE COUNT.: 0.15 x 1000/??L (ref 0.00–1.00)
BKR WAM EOSINOPHILS: 1 % (ref 0.0–5.0)
BKR WAM HEMOGLOBIN: 13 g/dL (ref 11.7–15.5)
BKR WAM IMMATURE GRANULOCYTES: 0.4 % (ref 0.0–1.0)
BKR WAM LYMPHOCYTES: 9.1 % — ABNORMAL LOW (ref 17.0–50.0)
BKR WAM MCH (PG): 26.6 pg — ABNORMAL LOW (ref 27.0–33.0)
BKR WAM MCHC: 32.4 g/dL (ref 31.0–36.0)
BKR WAM MCV: 82 fL (ref 80.0–100.0)
BKR WAM MONOCYTE ABSOLUTE COUNT.: 0.76 x 1000/ÂµL (ref 0.00–1.00)
BKR WAM MONOCYTES: 5 % (ref 4.0–12.0)
BKR WAM MPV: 10.8 fL (ref 8.0–12.0)
BKR WAM NEUTROPHILS: 84 % — ABNORMAL HIGH (ref 39.0–72.0)
BKR WAM NUCLEATED RED BLOOD CELLS: 0 % (ref 0.0–1.0)
BKR WAM PLATELETS: 349 x1000/ÂµL (ref 150–420)
BKR WAM RDW-CV: 15.6 % — ABNORMAL HIGH (ref 11.0–15.0)
BKR WAM RED BLOOD CELL COUNT.: 4.89 M/ÂµL (ref 4.00–6.00)
BKR WAM WHITE BLOOD CELL COUNT: 15.1 x1000/??L — ABNORMAL HIGH (ref 4.0–11.0)

## 2022-09-01 MED ORDER — AMPICILLIN-SULBACTAM (UNASYN) 1.5GM MBP
Freq: Four times a day (QID) | INTRAVENOUS | Status: DC
Start: 2022-09-01 — End: 2022-09-02
  Administered 2022-09-01: 21:00:00 50.000 mL/h via INTRAVENOUS

## 2022-09-01 MED ORDER — IOHEXOL 350 MG IODINE/ML INTRAVENOUS SOLUTION
350 mg iodine/mL | Freq: Once | INTRAVENOUS | Status: CP | PRN
Start: 2022-09-01 — End: ?
  Administered 2022-09-01: 21:00:00 350 mL via INTRAVENOUS

## 2022-09-01 MED ORDER — IBUPROFEN 600 MG TABLET
600 mg | Freq: Once | ORAL | Status: CP
Start: 2022-09-01 — End: ?
  Administered 2022-09-01: 20:00:00 600 mg via ORAL

## 2022-09-01 MED ORDER — ACETAMINOPHEN 325 MG TABLET
325 mg | Freq: Once | ORAL | Status: CP
Start: 2022-09-01 — End: ?
  Administered 2022-09-01: 20:00:00 325 mg via ORAL

## 2022-09-01 MED ORDER — AMOXICILLIN 875 MG-POTASSIUM CLAVULANATE 125 MG TABLET
875-125 mg | ORAL_TABLET | Freq: Two times a day (BID) | ORAL | 1 refills | Status: AC
Start: 2022-09-01 — End: ?

## 2022-09-01 MED ORDER — DEXAMETHASONE SODIUM PHOSPHATE (PF) 10 MG/ML INJECTION SOLUTION
10 mg/mL | Freq: Once | ORAL | Status: CP
Start: 2022-09-01 — End: ?
  Administered 2022-09-01: 20:00:00 10 mL via ORAL

## 2022-09-01 MED ORDER — SODIUM CHLORIDE 0.9 % LARGE VOLUME SYRINGE FOR AUTOINJECTOR
Freq: Once | INTRAVENOUS | Status: CP | PRN
Start: 2022-09-01 — End: ?
  Administered 2022-09-01: 21:00:00 via INTRAVENOUS

## 2022-09-01 NOTE — Discharge Instructions
Take augmentin twice daily for ten days. Follow up with ENT in 1-2 weeks. If the swelling or pain gets worse please return to the ED. Suck on hard candy for increased saliva production. Do warm compresses and massage your neck for aid in removing the stone.

## 2022-09-01 NOTE — ED Notes
3:17 PM Patient here with left sided neck/dental pain, worsening since last night. Patient reports a lump appeared 2 years ago to left side of neck and intermittently has bothered her. Patient reports last night pain worsened and had ?discharge/drainage into mouth. Airway intact. Left #20 PIV placed, labs drawn and sent. Pending [place]. 3:26 PM Report given to Melina Copa, care deferred. Past Medical History: Diagnosis Date  Acute ischemic colitis (HC Code) (HC CODE) (HC Code) 2015  Depression   Diabetes mellitus (HC Code)   Hypercholesteremia   Lupus anticoagulant disorder (HC Code)   Rosacea   Ventral hernia with bowel obstruction   Vertigo

## 2022-09-03 ENCOUNTER — Inpatient Hospital Stay
Admit: 2022-09-03 | Discharge: 2022-09-03 | Payer: PRIVATE HEALTH INSURANCE | Attending: Student in an Organized Health Care Education/Training Program

## 2022-09-03 ENCOUNTER — Telehealth: Admit: 2022-09-03 | Payer: PRIVATE HEALTH INSURANCE | Attending: Internal Medicine | Primary: Internal Medicine

## 2022-09-03 LAB — CBC WITH AUTO DIFFERENTIAL
BKR WAM ABSOLUTE IMMATURE GRANULOCYTES.: 0.07 x 1000/??L (ref 0.00–0.30)
BKR WAM ABSOLUTE LYMPHOCYTE COUNT.: 1.95 x 1000/??L (ref 0.60–3.70)
BKR WAM ABSOLUTE NRBC (2 DEC): 0 x 1000/??L (ref 0.00–1.00)
BKR WAM ANALYZER ANC: 9.89 x 1000/??L — ABNORMAL HIGH (ref 2.00–7.60)
BKR WAM BASOPHIL ABSOLUTE COUNT.: 0.04 x 1000/??L (ref 0.00–1.00)
BKR WAM BASOPHILS: 0.3 % (ref 0.0–1.4)
BKR WAM EOSINOPHIL ABSOLUTE COUNT.: 0.2 x 1000/??L (ref 0.00–1.00)
BKR WAM EOSINOPHILS: 1.5 % (ref 0.0–5.0)
BKR WAM HEMATOCRIT (2 DEC): 34.9 % — ABNORMAL LOW (ref 35.00–45.00)
BKR WAM HEMOGLOBIN: 11.2 g/dL — ABNORMAL LOW (ref 11.7–15.5)
BKR WAM IMMATURE GRANULOCYTES: 0.5 % (ref 0.0–1.0)
BKR WAM LYMPHOCYTES: 14.9 % — ABNORMAL LOW (ref 17.0–50.0)
BKR WAM MCH (PG): 26.9 pg — ABNORMAL LOW (ref 27.0–33.0)
BKR WAM MCHC: 32.1 g/dL (ref 31.0–36.0)
BKR WAM MCV: 83.7 fL (ref 80.0–100.0)
BKR WAM MONOCYTE ABSOLUTE COUNT.: 0.96 x 1000/ÂµL (ref 0.00–1.00)
BKR WAM MONOCYTES: 7.3 % (ref 4.0–12.0)
BKR WAM MPV: 11 fL (ref 8.0–12.0)
BKR WAM NEUTROPHILS: 75.5 % — ABNORMAL HIGH (ref 39.0–72.0)
BKR WAM NUCLEATED RED BLOOD CELLS: 0 % (ref 0.0–1.0)
BKR WAM PLATELETS: 193 x1000/??L (ref 150–420)
BKR WAM RDW-CV: 16.4 % — ABNORMAL HIGH (ref 11.0–15.0)
BKR WAM RED BLOOD CELL COUNT.: 4.17 M/ÂµL (ref 4.00–6.00)
BKR WAM WHITE BLOOD CELL COUNT: 13.1 x1000/ÂµL — ABNORMAL HIGH (ref 4.0–11.0)

## 2022-09-03 LAB — GROUP A STREPTOCOCCUS BY PCR     (BH GH LMW YH): BKR GROUP A STREP PCR: NOT DETECTED

## 2022-09-03 MED ORDER — KETOROLAC 60 MG/2 ML INTRAMUSCULAR SOLUTION
602 mg/2 mL | Freq: Once | INTRAVENOUS | Status: CP
Start: 2022-09-03 — End: ?
  Administered 2022-09-03: 20:00:00 60 mL via INTRAVENOUS

## 2022-09-03 MED ORDER — AMPICILLIN-SULBACTAM (UNASYN) 3GM MBP
Freq: Four times a day (QID) | INTRAVENOUS | Status: DC
Start: 2022-09-03 — End: 2022-09-04
  Administered 2022-09-03: 19:00:00 100.000 mL/h via INTRAVENOUS

## 2022-09-03 MED ORDER — ACETAMINOPHEN 325 MG TABLET
325 mg | Freq: Once | ORAL | Status: DC
Start: 2022-09-03 — End: 2022-09-03

## 2022-09-03 MED ORDER — AMPICILLIN-SULBACTAM (UNASYN) 1.5GM MBP
Freq: Four times a day (QID) | INTRAVENOUS | Status: DC
Start: 2022-09-03 — End: 2022-09-03

## 2022-09-03 MED ORDER — SODIUM CHLORIDE 0.9 % BOLUS (NEW BAG)
0.9 % | Freq: Once | INTRAVENOUS | Status: CP
Start: 2022-09-03 — End: ?
  Administered 2022-09-03: 21:00:00 0.9 mL/h via INTRAVENOUS

## 2022-09-03 NOTE — Other
[facility] ConsultDate of Consult: 09/03/2022 Consulting Physician: EDHPI:  Maria [name] is a 72 y.o. female with a PMH significant for ischemic colitis s/p colostomy, DM, who presents with left submandibular sialadenitis. Patient reports that 2 weeks ago started to notice a strange taste in her mouth and expressed fluid from left floor of mouth. She came to ED 2 days ago with worsening pain and swelling and [place] scan with left SMG sialadenitis with sialolith of 3mm. She was started on antibiotics, Unasyn in ED, and has since been taking Augmentin. She has been doing massages, compresses, and sour candies. However, pain has worsened with associate muffled voice and difficulty swallowing due to pain. She is breathing on room air and managing secretions.WBC 13.1 from 15.1. Afebrile.PMHx:Past Medical History: Diagnosis Date  Acute ischemic colitis (HC Code) (HC CODE) (HC Code) 2015  Depression   Diabetes mellitus (HC Code)   Hypercholesteremia   Lupus anticoagulant disorder (HC Code)   Rosacea   Ventral hernia with bowel obstruction   Vertigo  PSHx:Past Surgical History: Procedure Laterality Date  ABDOMINAL ADHESION SURGERY  2016  BLEPHAROPLASTY Bilateral 05/2016  COLOSTOMY  05/27/13  COLOSTOMY CLOSURE    HYSTEROSCOPY W/ POLYPECTOMY  06/18/2020  KNEE LIGAMENT RECONSTRUCTION Right 2004  LAPAROSCOPIC NISSEN FUNDOPLICATION    2000 ?  SINUS SURGERY    THYROID BIOPSY    TUBAL LIGATION    UMBILICAL HERNIA REPAIR    VENTRAL HERNIA REPAIR   Allergies: Allergies Allergen Reactions  Morphine Hives and Itching   Pt tolerated morphine during admission 08/08/16-08/09/16.  Pt premedicated with diphenhydramine  Biaxin  [Clarithromycin]   Latex, Natural Rubber Rash Objective: Vitals:Temp:  [98.4 ?F (36.9 ?C)] 98.4 ?F (36.9 ?C)Pulse:  [115] 115Resp:  [18] 18BP: (94)/(53) 94/53SpO2:  [96 %] 96 %Device (Oxygen Therapy): room airI/O's:No intake or output data in the 24 hours ending 09/03/22 1835Physical Exam:General: Alert, OrientedVoice strong, Speaking in full sentences, muffled voice Head and Neck: Eyes: EOM appear intactFace symmetric Nose: Anterior Nose clear, no septal hematoma visualizedOP/OC: Floor of mouth with inflammed Wharton's ducts bilaterally L>R with inability to express saliva from left duct. There are white areas of duct appearing to be stones.Neck: left sided submandibular firmness without fluctuance or overlying skin changesPulm: Unlabored on RA Procedure: verbal informed consent obtained for simple cutdown of left submandibular duct. Area injected with 5cc 2% lidocaine 1:100000 epinephrine with return of purulence. This was cultured. The duct was incised using a 15 blade 1cm with expression of approximately 5cc of purulence with stones. The duct was irrigated with 20cc saline. Tolerated well.Labs:Recent Labs   05/07/241515 05/09/241512 WBC 15.1* 13.1* HGB 13.0 11.2* HCT 40.10 34.90* MCV 82.0 83.7 PLT 349 193 Recent Labs   05/07/241515 NA 130* K 4.8 CL 96* CO2 25 BUN 14 CREATININE 0.90 GLU 274* CALCIUM 9.2 No results for input(s): PTT, INR in the last 72 hours.Invalid input(s): PTImaging Studies:No results found.Assessment   Maria [name] is a 72 y.o. female with exam and imaging findings consistent with left submandibular sialadenitis worsened due to occlusion of duct with stones. Culture collected at bedside (order placed). Plan - FU Culture - 14 days of Augmentin, ok to continue- Start peridex for 10 days- Soft diet- Frequent sialogogues (e.g. Lemon wedges) - Submandibular Massage frequently, at least 3 times per day (Massage from under submandibular gland toward corner of mouth with the goal of expressing drainage/purluence from duct)- Warm Compresses, PRN - Keep patient well hydrated- If patient is able to tolerate PO, she  can be discharged with follow up- Seen with Dr. Beckey Rutter who was present for the procedureDaniel Christella Hartigan, MDResidentOtolaryngology-Head and Neck SurgeryDepartment of [facility]05/09/246:35 PM

## 2022-09-03 NOTE — ED Triage Note
Provider in Triage Note85 y.o. year old female  presents with worsening left sided facial pain, difficulty swallowing. Dx sialadenitis yesterday. No improvement with tylenol, massage, sore candies, augmentinPhysical Exam: non toxicOrders placed in triage: yesDisposition: Main Emergency Department for further evaluation.Maria [name] M Hoffman5/12/2022 2:55 PM

## 2022-09-03 NOTE — Telephone Encounter
Ed follow up

## 2022-09-03 NOTE — Telephone Encounter
Received message from patient requesting return call.  Patient was contacted and states her pain and swelling are worse than at her ED visit, she is taking Tylenol, antibiotics, using warm packs to the area of swelling and massaging the area as instructed in her discharge instructions brought her condition as worsening.  She was instructed to return to the emergency department for re-evaluation and agrees to do so.  She was entered as expected patient at [facility].

## 2022-09-03 NOTE — Discharge Instructions
Contin?e tomando el augmentin seg?n lo recetado. Realice un seguimiento con otorrinolaring?logo en una semana. Llame al n?mero de tel?fono anterior.

## 2022-09-04 ENCOUNTER — Telehealth
Admit: 2022-09-04 | Payer: PRIVATE HEALTH INSURANCE | Attending: Student in an Organized Health Care Education/Training Program | Primary: Internal Medicine

## 2022-09-04 ENCOUNTER — Encounter: Admit: 2022-09-04 | Payer: PRIVATE HEALTH INSURANCE | Primary: Internal Medicine

## 2022-09-04 DIAGNOSIS — Z5181 Encounter for therapeutic drug level monitoring: Secondary | ICD-10-CM

## 2022-09-04 NOTE — Telephone Encounter
LVM for pt to call back to schedule an appt with Dr Beckey Rutter on Wednesday 5/16 at 10 am.  (Will be an overbook)

## 2022-09-04 NOTE — Telephone Encounter
Pt called this morning was seen at ED yesterday for submandibular sialadenitis  and was advised to follow up with Dr. Beckey Rutter within 1 week, Pt refused next available, Please advise on scheduling. Pt can be reached at [phone].

## 2022-09-04 NOTE — ED Notes
7:15 PM Received report from RN. Assumed care of patient8:15 PM PIV removed. Pt received d/c instructions by MD provider. Pt understands d/c instructions. All questions answered. Ambulatory w/ steady gait leaving AED. VSS on RA.

## 2022-09-04 NOTE — ED Provider Notes
Chief Complaint Patient presents with  Dental Pain   Pt presents to ed with mouth pain. Pt reports she was diagnosed with sialadenitis and started on po abx. Pt reports the pain and swelling in her mouth is getting worse. Pt reports she has not been able to drink or eat. Pt reports difficulty swallowing. Pt does have muffled speech. Pts respirations non-labored, gait is steady. 72 y/o woman with left submandibular swelling, diagnosed with submandibular sialadenitis.  Patient was been unable to take her antibiotics due to having difficulty swallowing due to her swelling.  Has been doing massages, compresses and trying are candies pain is now severe and her voice appears muffled.She reports he is having difficulty swallowing solids but is able to swallow liquids On physical exam she has obvious left-sided unilateral swelling, she has a muffled voice, she is afebrile, she is able to handle her secretions- Discussed with ENT who were able to marsupialize the duct. Patient reports immediate relief, patient's voice sounds better, she is able to swallow liquids and pills in the emergency department.  Plan to discharge, she will follow up with ENT.Patient has Augmentin already from previous visit has 7 days left.  I have prescribed Peridex for the patientMDM  Physical ExamED Triage Vitals [09/03/22 1454]BP: (!) 94/53Pulse: (!) 115Pulse from  O2 sat: n/aResp: 18Temp: 98.4 ?F (36.9 ?C)Temp src: OralSpO2: 96 % BP 134/78  - Pulse (!) 100  - Temp 98.1 ?F (36.7 ?C) (Oral)  - Resp 18  - Wt 69 kg  - SpO2 100%  - BMI 30.72 kg/m? Physical Exam ProceduresAttestation/Critical CareClinical Impressions as of 09/03/22 2251 Sialadenitis  ED DispositionDischarge Rockie Neighbours, MD05/09/24 2251

## 2022-09-07 ENCOUNTER — Inpatient Hospital Stay: Admit: 2022-09-07 | Discharge: 2022-09-07 | Payer: PRIVATE HEALTH INSURANCE | Primary: Internal Medicine

## 2022-09-07 ENCOUNTER — Ambulatory Visit: Admit: 2022-09-07 | Payer: PRIVATE HEALTH INSURANCE | Attending: Hematology & Oncology | Primary: Internal Medicine

## 2022-09-07 DIAGNOSIS — Z5181 Encounter for therapeutic drug level monitoring: Secondary | ICD-10-CM

## 2022-09-07 DIAGNOSIS — Z7901 Long term (current) use of anticoagulants: Secondary | ICD-10-CM

## 2022-09-07 LAB — PROTIME AND INR
BKR INR: 3.16 — ABNORMAL HIGH (ref 0.86–1.12)
BKR PROTHROMBIN TIME: 32.5 s — ABNORMAL HIGH (ref 9.6–12.3)

## 2022-09-07 NOTE — Telephone Encounter
-----   Message from Billey Chang, MD sent at 09/04/2022  9:23 AM EDT -----Regarding: RE: Follow up from EDOK for OB if needed----- Message -----From: Rob Bunting, MDSent: 09/03/2022   6:45 PM EDTTo: Billey Chang, MD; #Subject: Follow up from ED                            Hi,Please schedule for follow up for 1 week for sialadenitis in return spot with Dr. Beckey Rutter.[place]

## 2022-09-07 NOTE — Telephone Encounter
Patient accepted 10am on Wednesday

## 2022-09-08 ENCOUNTER — Ambulatory Visit: Admit: 2022-09-08 | Payer: PRIVATE HEALTH INSURANCE | Primary: Internal Medicine

## 2022-09-08 ENCOUNTER — Encounter: Admit: 2022-09-08 | Payer: PRIVATE HEALTH INSURANCE | Primary: Internal Medicine

## 2022-09-08 DIAGNOSIS — K436 Other and unspecified ventral hernia with obstruction, without gangrene: Secondary | ICD-10-CM

## 2022-09-08 DIAGNOSIS — E119 Type 2 diabetes mellitus without complications: Secondary | ICD-10-CM

## 2022-09-08 DIAGNOSIS — D6862 Lupus anticoagulant syndrome: Secondary | ICD-10-CM

## 2022-09-08 DIAGNOSIS — L719 Rosacea, unspecified: Secondary | ICD-10-CM

## 2022-09-08 DIAGNOSIS — K55039 Acute (reversible) ischemia of large intestine, extent unspecified: Secondary | ICD-10-CM

## 2022-09-08 DIAGNOSIS — E7800 Pure hypercholesterolemia, unspecified: Secondary | ICD-10-CM

## 2022-09-08 DIAGNOSIS — K559 Vascular disorder of intestine, unspecified: Secondary | ICD-10-CM

## 2022-09-08 DIAGNOSIS — F32A Depression: Secondary | ICD-10-CM

## 2022-09-08 DIAGNOSIS — R42 Dizziness and giddiness: Secondary | ICD-10-CM

## 2022-09-08 NOTE — Progress Notes
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  The patient was contacted regarding lab drawn INR results.  Patient's INR findings and prescribed dose from last visit:  06/22/2022   3:27 PM 07/24/2022   3:38 PM 09/08/2022   9:50 AM Anticoagulation Monitoring Assoc. INR Date 06/22/2022 07/24/2022 09/07/2022 Associated INR 2.02 2.77 3.16 Instructions 7.5 mg every Tue, Thu; 5 mg all other days 7.5 mg every Tue, Thu; 5 mg all other days 7.5 mg every Tue, Thu; 5 mg all other days Sunday Dose 5 mg 5 mg 5 mg Monday Dose 5 mg 5 mg - Tuesday Dose 7.5 mg 7.5 mg 7.5 mg Wednesday Dose 5 mg 5 mg 5 mg Thursday Dose 7.5 mg 7.5 mg 7.5 mg Friday Dose 5 mg 5 mg 5 mg Saturday Dose 5 mg 5 mg 5 mg Last 7 day dose total 40 mg 40 mg 40 mg Next INR Date 07/20/2022 08/21/2022 09/14/2022 The following findings were reported from today's call (no findings if none selected):[]  Missed doses []  Extra doses [x]  Change in medications []  Change in vitamin k rich food intake (green vegetables, herbals, etc) []  Change in alcohol use []  Change in overall health [x]  Recent hospitalization / emergency department visit []  Upcoming procedure (including dental procedures) []  Other Comments:       Patient was prescribe Augmentin in the ER for siladenitis The following side effects were reported from today's call (no side effects if none selected):[]  Bleeding []  New bruising []  Warfarin-emergency department visit or hospital admission []  Other Comments:   No side effects Assessment/Plan:Patient's INR is 3.16, which is above goal range of 2.0-3.0.  Patient was prescribe Augmentin in the ER for siladenitis.  She was instructed to continue current weekly dose as indicated in the maintenance plan below.  Next INR assessment due 1 week.  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  Electronically Signed by [name], RPH, Sep 08, 2022 [facility]  [address] [place], [place] [address]  Phone: [phone]  Fax: [phone]

## 2022-09-09 ENCOUNTER — Ambulatory Visit
Admit: 2022-09-09 | Payer: PRIVATE HEALTH INSURANCE | Attending: Student in an Organized Health Care Education/Training Program | Primary: Internal Medicine

## 2022-09-09 ENCOUNTER — Encounter
Admit: 2022-09-09 | Payer: PRIVATE HEALTH INSURANCE | Attending: Student in an Organized Health Care Education/Training Program | Primary: Internal Medicine

## 2022-09-09 DIAGNOSIS — D6862 Lupus anticoagulant syndrome: Secondary | ICD-10-CM

## 2022-09-09 DIAGNOSIS — E119 Type 2 diabetes mellitus without complications: Secondary | ICD-10-CM

## 2022-09-09 DIAGNOSIS — K115 Sialolithiasis: Secondary | ICD-10-CM

## 2022-09-09 DIAGNOSIS — R42 Dizziness and giddiness: Secondary | ICD-10-CM

## 2022-09-09 DIAGNOSIS — L719 Rosacea, unspecified: Secondary | ICD-10-CM

## 2022-09-09 DIAGNOSIS — F32A Depression: Secondary | ICD-10-CM

## 2022-09-09 DIAGNOSIS — K436 Other and unspecified ventral hernia with obstruction, without gangrene: Secondary | ICD-10-CM

## 2022-09-09 DIAGNOSIS — K55039 Acute (reversible) ischemia of large intestine, extent unspecified: Secondary | ICD-10-CM

## 2022-09-09 DIAGNOSIS — E7800 Pure hypercholesterolemia, unspecified: Secondary | ICD-10-CM

## 2022-09-09 LAB — DEEP WOUND CULTURE   (BH GH LMW YH)

## 2022-09-09 NOTE — Progress Notes
[facility] OF MEDICINE                                                                              Department of SurgeryDivision of Otolaryngology - Head and Neck SurgeryNAME: Maria RiveraDATE OF BIRTH: [birth year]DATE OF VISIT: 5/15/2024CHIEF COMPLAINT: No chief complaint on file.HISTORY OF PRESENT ILLNESS: [name] is here following ED visit for consultation regarding evaluation of sialolithsShe is now s/p marsupialization of left submandibular gland duct with release of sialoliths and purulence, performed 5/9/2024Has been taking antibiotics, doing massage, drinking water and warm compresses as instructed. Feeling much betterThe patient?s complete past medical, family, and social history have been reviewed and are as noted in the electronic record.REVIEW OF SYSTEMS:  A twelve system questionnaire was completed by the patient and reviewed with them.  Pertinent positives and negatives are noted in the history above.PAST MEDICAL HISTORY:  Past Medical History: Diagnosis Date  Acute ischemic colitis (HC Code) (HC CODE) (HC Code) 2015  Depression   Diabetes mellitus (HC Code)   Hypercholesteremia   Lupus anticoagulant disorder (HC Code)   Rosacea   Ventral hernia with bowel obstruction   Vertigo  PAST SURGICAL HISTORY:  Past Surgical History: Procedure Laterality Date  ABDOMINAL ADHESION SURGERY  2016  BLEPHAROPLASTY Bilateral 05/2016  COLOSTOMY  05/27/13  COLOSTOMY CLOSURE    HYSTEROSCOPY W/ POLYPECTOMY  06/18/2020  KNEE LIGAMENT RECONSTRUCTION Right 2004  LAPAROSCOPIC NISSEN FUNDOPLICATION    2000 ?  SINUS SURGERY    THYROID BIOPSY    TUBAL LIGATION    UMBILICAL HERNIA REPAIR    VENTRAL HERNIA REPAIR   MEDICATIONS:   Current Outpatient Medications on File Prior to Visit Medication Sig Dispense Refill  acetaminophen (TYLENOL) 500 mg tablet Take 1 tablet (500 mg total) by mouth every 4 (four) hours as needed. 30 tablet 11  amoxicillin-clavulanate (AUGMENTIN) 875-125 mg per tablet Take 1 tablet by mouth every 12 (twelve) hours for 10 days. 20 tablet 0  biotin 1 mg tablet Take 1 tablet (1,000 mcg total) by mouth daily.    cholecalciferol, vitamin D3, 125 mcg (5,000 unit) tablet Take 1 tablet (5,000 Units total) by mouth every other day.    diphenhydramine HCl (BENADRYL ALLERGY ORAL) Take 1 tablet by mouth as needed.    glipiZIDE (GLUCOTROL) 5 mg Immediate Release tablet Take 1 tablet (5 mg total) by mouth daily before breakfast. 90 tablet 3  JARDIANCE 25 mg tablet 1 TABLET BY MOUTH ONCE DAILY IN THE MORNING FOR 30 DAYS    meclizine (ANTIVERT) 12.5 mg tablet Take 1 tablet (12.5 mg total) by mouth daily as needed for Dizziness 30 tablet 1  melatonin 10 mg Tab Take 10 mg by mouth nightly as needed.     multivitamin tablet Take 1 tablet by mouth daily.    olopatadine (PATADAY) 0.2 % ophthalmic solution Place 1 drop into both eyes daily. 2.5 mL 6  ondansetron (ZOFRAN) 4 MG tablet Take 1 tablet (4 mg total) by mouth every 8 (eight) hours as needed for nausea.    phytonadione, vit K1, (VITAMIN K) 100 mcg tablet Take 1 tablet (100 mcg total) by mouth daily.    polyethylene glycol (MIRALAX) 17 gram packet Take 1  packet (17 g total) by mouth daily. Mix in 8 ounces of water, juice, soda, coffee or tea prior to taking. 14 each 2  pravastatin (PRAVACHOL) 40 MG tablet Take 1.5 tablets (60 mg total) by mouth nightly.    senna (SENOKOT) 8.6 mg tablet Take 2 tablets by mouth nightly.. 30 tablet 11  sitaGLIPtin (JANUVIA) 100 MG tablet Take 1 tablet (100 mg total) by mouth daily.    venlafaxine (EFFEXOR XR) 150 mg XR 24 hr extended release capsule Take 1 capsule (150 mg total) by mouth daily.    warfarin (COUMADIN) 5 mg tablet Take 2.5 mg on Tuesdays and Thursdays and 5 mg all other days of the week or as directed by warfarin clinic 90 tablet 3 No current facility-administered medications on file prior to visit. ALLERGIES:   Morphine; Biaxin  [clarithromycin]; and Latex, natural rubberSOCIAL HISTORY:  Social History Socioeconomic History  Marital status: Married   Spouse name: Not on file  Number of children: Not on file  Years of education: Not on file  Highest education level: Not on file Occupational History  Not on file Tobacco Use  Smoking status: Never  Smokeless tobacco: Never Vaping Use  Vaping Use: Never used Substance and Sexual Activity  Alcohol use: Not Currently  Drug use: No  Sexual activity: Yes   Partners: Male Other Topics Concern  Not on file Social History Narrative  Not on file Social Determinants of Health Financial Resource Strain: Not on file Food Insecurity: Not on file Transportation Needs: Not on file Physical Activity: Not on file Stress: Not on file Social Connections: Not on file Intimate Partner Violence: Not on file Housing Stability: Low Risk  (03/05/2021)  Housing Stability   Housing Stability: I have a steady place to live FAMILY HISTORY:  Family History Problem Relation Age of Onset  Bladder cancer Mother   Colon cancer Paternal Aunt   Colon cancer Maternal Cousin       died of colona cancer at age 62  Breast cancer Maternal Cousin       2 cousins with breast cancer  Colon cancer Father  ENT PHYSICAL EXAMINATION: Vitals: Vitals:  09/09/22 1034 Pulse: (!) 96 Temp: 98.6 ?F (37 ?C) Constitutional:  Well-developed and well-nourished and in no apparent distress.  Voice: Clear, strong.Head and Face:  No cutaneous lesions of the head or face. Face symmetric with movement and at restEyes:  Extraocular movements were intact without nystagmus.   Ears:  Bilateral - Auricles normal. Nose:  External nose without abnormality. Anterior rhinoscopy demonstrated a midline nasal septum with normal turbinates.  Oral Cavity/Oropharynx: Oral cavity without masses/lesions/mucosal abnormality. Teeth and gums unremarkable. Floor of mouth and oral tongue were soft. Left Wharton's duct well-healed. Excellent clear salivary flow from papilla when submandibular gland palpated. Tongue was fully mobile and protruded midline. No masses/lesions/mucosal abnormalities of the oropharynx. Neck/Lymphatic: No palpable lymph nodes or masses in the lateral neck or central compartment. No thyroid masses palpated. No palpable parotid or submandibular salivary gland masses. Respiratory: Breathing comfortably, non-labored. No stridor.Neurologic: Alert, oriented. Mood and affect normal. Cranial nerves II-XII intact bilaterally except as noted.DATA REVIEW:  [place] SOFT TISSUE NECK W IV CONTRAST 5/7/2024FINDINGS: Marked enlargement and diffuse hyperattenuation of the left submandibular gland with dilatation of the left submandibular duct secondary to obstructing 3 mm stone (series 4 image 102). Prominent upper cervical lymph node, likely reactive. The oropharynx, nasopharynx, hypopharynx and the laryngeal structures are unremarkable.    The parotid, right submandibular and thyroid glands are  unremarkable.  The visualized paranasal air sinuses are unremarkable.  The great vessels of the neck are unremarkable. The cervical spine is unremarkable. The visualized portion of the brain is unremarkable. The imaged lung apices are clear. IMPRESSION: Left submandibular sialolithiasis with acute sialadenitis. [facility] Notify System Classification: Routine. Report initiated by:  Lesia Hausen, MD Reported and signed by: Ellin Goodie, MD  [facility] Radiology and Biomedical ImagingIMPRESSION:  Maria [name] is a 72 y.o. female with left submandibular sialoadenitis s/p marsupialization of left submandibular gland duct with release of sialoliths and purulence, performed 09/03/2022. Now well-healed and doing wellRECOMMENDATIONS:  -Complete course of antibiotics-Discussed possibility of recurrence-Briefly discussed conservative management versus sialoendoscopy versus submandibular gland excision-Return to clinic 2-3 months to reassess symptomsThe patient knows to contact me at any point with questions or concerns.Lavonna Rua, MDDivision of Otolaryngology - Head and Neck SurgeryGreater than a total of 20 minutes was personally spent by me on tasks that may have included reviewing tests, preparing for visit, obtaining a history, examining the patient, counseling/education, ordering tests/treatments/medications, communicating with other health care providers, coordinating care, and completing health record documentation.  This does not include any resident/fellow teaching time, or any time spent performing a procedural service.

## 2022-09-10 ENCOUNTER — Inpatient Hospital Stay
Admit: 2022-09-10 | Discharge: 2022-09-13 | Payer: PRIVATE HEALTH INSURANCE | Attending: Trauma Surgery | Admitting: Surgical Critical Care

## 2022-09-10 ENCOUNTER — Emergency Department: Admit: 2022-09-10 | Payer: PRIVATE HEALTH INSURANCE | Primary: Internal Medicine

## 2022-09-10 DIAGNOSIS — K56609 Unspecified intestinal obstruction, unspecified as to partial versus complete obstruction: Secondary | ICD-10-CM

## 2022-09-10 LAB — HEPATIC FUNCTION PANEL
BKR A/G RATIO: 1 (ref 1.0–2.2)
BKR ALANINE AMINOTRANSFERASE (ALT): 30 U/L (ref 10–35)
BKR ALBUMIN: 4 g/dL (ref 3.6–5.1)
BKR ALKALINE PHOSPHATASE: 168 U/L — ABNORMAL HIGH (ref 9–122)
BKR ASPARTATE AMINOTRANSFERASE (AST): 33 U/L (ref 10–35)
BKR AST/ALT RATIO: 1.1
BKR BILIRUBIN DIRECT: <0.2 mg/dL (ref <=0.3)
BKR BILIRUBIN TOTAL: 0.3 mg/dL (ref <=1.2)
BKR GLOBULIN: 4 g/dL — ABNORMAL HIGH (ref 2.0–3.9)
BKR PROTEIN TOTAL: 8 g/dL (ref 5.9–8.3)

## 2022-09-10 LAB — CBC WITH AUTO DIFFERENTIAL
BKR WAM ABSOLUTE IMMATURE GRANULOCYTES.: 0.07 x 1000/ÂµL (ref 0.00–0.30)
BKR WAM ABSOLUTE IMMATURE GRANULOCYTES.: 0.07 x 1000/ÂµL (ref 0.00–0.30)
BKR WAM ABSOLUTE LYMPHOCYTE COUNT.: 1.4 x 1000/ÂµL (ref 0.60–3.70)
BKR WAM ABSOLUTE LYMPHOCYTE COUNT.: 1.41 x 1000/ÂµL (ref 0.60–3.70)
BKR WAM ABSOLUTE NRBC (2 DEC): 0 x 1000/ÂµL (ref 0.00–1.00)
BKR WAM ABSOLUTE NRBC (2 DEC): 0 x 1000/ÂµL (ref 0.00–1.00)
BKR WAM ANALYZER ANC: 13.99 x 1000/ÂµL — ABNORMAL HIGH (ref 2.00–7.60)
BKR WAM ANALYZER ANC: 12.22 x 1000/ÂµL — ABNORMAL HIGH (ref 2.00–7.60)
BKR WAM BASOPHIL ABSOLUTE COUNT.: 0.06 x 1000/ÂµL (ref 0.00–1.00)
BKR WAM BASOPHIL ABSOLUTE COUNT.: 0.05 x 1000/ÂµL (ref 0.00–1.00)
BKR WAM BASOPHILS: 0.4 % (ref 0.0–1.4)
BKR WAM BASOPHILS: 0.3 % (ref 0.0–1.4)
BKR WAM EOSINOPHIL ABSOLUTE COUNT.: 0.15 x 1000/ÂµL (ref 0.00–1.00)
BKR WAM EOSINOPHIL ABSOLUTE COUNT.: 0.12 x 1000/ÂµL (ref 0.00–1.00)
BKR WAM EOSINOPHILS: 0.9 % (ref 0.0–5.0)
BKR WAM EOSINOPHILS: 0.8 % (ref 0.0–5.0)
BKR WAM HEMATOCRIT (2 DEC): 44.2 % (ref 35.00–45.00)
BKR WAM HEMATOCRIT (2 DEC): 40 % (ref 35.00–45.00)
BKR WAM HEMOGLOBIN: 14.5 g/dL (ref 11.7–15.5)
BKR WAM HEMOGLOBIN: 12.9 g/dL (ref 11.7–15.5)
BKR WAM IMMATURE GRANULOCYTES: 0.4 % (ref 0.0–1.0)
BKR WAM IMMATURE GRANULOCYTES: 0.5 % (ref 0.0–1.0)
BKR WAM LYMPHOCYTES: 8.6 % — ABNORMAL LOW (ref 17.0–50.0)
BKR WAM LYMPHOCYTES: 9.6 % — ABNORMAL LOW (ref 17.0–50.0)
BKR WAM MCH (PG): 26.7 pg — ABNORMAL LOW (ref 27.0–33.0)
BKR WAM MCH (PG): 26.4 pg — ABNORMAL LOW (ref 27.0–33.0)
BKR WAM MCHC: 32.8 g/dL (ref 31.0–36.0)
BKR WAM MCHC: 32.3 g/dL (ref 31.0–36.0)
BKR WAM MCV: 81.4 fL (ref 80.0–100.0)
BKR WAM MCV: 81.8 fL (ref 80.0–100.0)
BKR WAM MONOCYTE ABSOLUTE COUNT.: 0.56 x 1000/ÂµL (ref 0.00–1.00)
BKR WAM MONOCYTE ABSOLUTE COUNT.: 0.76 x 1000/ÂµL (ref 0.00–1.00)
BKR WAM MONOCYTES: 3.5 % — ABNORMAL LOW (ref 4.0–12.0)
BKR WAM MONOCYTES: 5.2 % (ref 4.0–12.0)
BKR WAM MPV: 10.4 fL (ref 8.0–12.0)
BKR WAM MPV: 10.7 fL (ref 8.0–12.0)
BKR WAM NEUTROPHILS: 86.2 % — ABNORMAL HIGH (ref 39.0–72.0)
BKR WAM NEUTROPHILS: 83.6 % — ABNORMAL HIGH (ref 39.0–72.0)
BKR WAM NUCLEATED RED BLOOD CELLS: 0 % (ref 0.0–1.0)
BKR WAM NUCLEATED RED BLOOD CELLS: 0 % (ref 0.0–1.0)
BKR WAM PLATELETS: 442 x1000/ÂµL — ABNORMAL HIGH (ref 150–420)
BKR WAM PLATELETS: 408 x1000/ÂµL (ref 150–420)
BKR WAM RDW-CV: 15.4 % — ABNORMAL HIGH (ref 11.0–15.0)
BKR WAM RDW-CV: 15.3 % — ABNORMAL HIGH (ref 11.0–15.0)
BKR WAM RED BLOOD CELL COUNT.: 5.43 M/ÂµL (ref 4.00–6.00)
BKR WAM RED BLOOD CELL COUNT.: 4.89 M/ÂµL (ref 4.00–6.00)
BKR WAM WHITE BLOOD CELL COUNT: 16.2 x1000/ÂµL — ABNORMAL HIGH (ref 4.0–11.0)
BKR WAM WHITE BLOOD CELL COUNT: 14.6 x1000/ÂµL — ABNORMAL HIGH (ref 4.0–11.0)

## 2022-09-10 LAB — BASIC METABOLIC PANEL
BKR ANION GAP: 13 (ref 7–17)
BKR ANION GAP: 12 (ref 7–17)
BKR BLOOD UREA NITROGEN: 15 mg/dL (ref 8–23)
BKR BLOOD UREA NITROGEN: 11 mg/dL (ref 8–23)
BKR BUN / CREAT RATIO: 18.8 (ref 8.0–23.0)
BKR BUN / CREAT RATIO: 18.3 (ref 8.0–23.0)
BKR CALCIUM: 9.7 mg/dL (ref 8.8–10.2)
BKR CALCIUM: 8.7 mg/dL — ABNORMAL LOW (ref 8.8–10.2)
BKR CHLORIDE: 96 mmol/L — ABNORMAL LOW (ref 98–107)
BKR CHLORIDE: 102 mmol/L (ref 98–107)
BKR CO2: 24 mmol/L (ref 20–30)
BKR CO2: 23 mmol/L (ref 20–30)
BKR CREATININE: 0.8 mg/dL (ref 0.40–1.30)
BKR CREATININE: 0.6 mg/dL (ref 0.40–1.30)
BKR EGFR, CREATININE (CKD-EPI 2021): >60 mL/min/{1.73_m2} (ref >=60)
BKR EGFR, CREATININE (CKD-EPI 2021): >60 mL/min/{1.73_m2} (ref >=60)
BKR GLUCOSE: 214 mg/dL — ABNORMAL HIGH (ref 70–100)
BKR GLUCOSE: 140 mg/dL — ABNORMAL HIGH (ref 70–100)
BKR POTASSIUM: 5.1 mmol/L (ref 3.3–5.3)
BKR POTASSIUM: 4.7 mmol/L (ref 3.3–5.3)
BKR SODIUM: 133 mmol/L — ABNORMAL LOW (ref 136–144)
BKR SODIUM: 137 mmol/L (ref 136–144)

## 2022-09-10 LAB — MAGNESIUM
BKR MAGNESIUM: 2.1 mg/dL (ref 1.7–2.4)
BKR MAGNESIUM: 2 mg/dL (ref 1.7–2.4)

## 2022-09-10 LAB — TROPONIN T HIGH SENSITIVITY, 0 HOUR BASELINE WITH REFLEX (BH GH LMW YH): BKR TROPONIN T HS 0 HOUR BASELINE: <6 ng/L

## 2022-09-10 LAB — PT/INR AND PTT (BH GH L LMW YH)
BKR INR: 3.16 — ABNORMAL HIGH (ref 0.88–1.14)
BKR PARTIAL THROMBOPLASTIN TIME: 55.6 s — ABNORMAL HIGH (ref 22.5–32.0)
BKR PROTHROMBIN TIME: 31.4 s — ABNORMAL HIGH (ref 9.5–12.1)

## 2022-09-10 LAB — LACTIC ACID, PLASMA (REFLEX 2H REPEAT): BKR LACTATE: 0.8 mmol/L (ref 0.5–2.2)

## 2022-09-10 LAB — LIPASE: BKR LIPASE: 49 U/L (ref 11–55)

## 2022-09-10 LAB — PHOSPHORUS     (BH GH L LMW YH): BKR PHOSPHORUS: 3.2 mg/dL (ref 2.2–4.5)

## 2022-09-10 MED ORDER — GABAPENTIN 300 MG CAPSULE
300 mg | Freq: Every evening | ORAL | Status: AC
Start: 2022-09-10 — End: ?

## 2022-09-10 MED ORDER — SODIUM CHLORIDE 0.9 % LARGE VOLUME SYRINGE FOR AUTOINJECTOR
Freq: Once | INTRAVENOUS | Status: CP | PRN
Start: 2022-09-10 — End: ?
  Administered 2022-09-10: 17:00:00 via INTRAVENOUS

## 2022-09-10 MED ORDER — MORPHINE 4 MG/ML INTRAVENOUS SOLUTION
4 mg/mL | Freq: Once | INTRAVENOUS | Status: CP
Start: 2022-09-10 — End: ?
  Administered 2022-09-10: 14:00:00 4 mL via INTRAVENOUS

## 2022-09-10 MED ORDER — SKIM MILK
ORAL | Status: DC | PRN
Start: 2022-09-10 — End: 2022-09-13

## 2022-09-10 MED ORDER — FRUIT JUICE
ORAL | Status: DC | PRN
Start: 2022-09-10 — End: 2022-09-13

## 2022-09-10 MED ORDER — DEXTROSE 10 % IV BOLUS FOR ORDERABLE
INTRAVENOUS | Status: DC | PRN
Start: 2022-09-10 — End: 2022-09-13

## 2022-09-10 MED ORDER — DEXTROSE 10 % IV BOLUS FOR ORDERABLE
INTRAVENOUS | Status: DC | PRN
Start: 2022-09-10 — End: 2022-09-13
  Administered 2022-09-12: 04:00:00 via INTRAVENOUS

## 2022-09-10 MED ORDER — WARFARIN 5 MG TABLET
5 mg | Freq: Every day | Status: AC
Start: 2022-09-10 — End: ?

## 2022-09-10 MED ORDER — INSULIN GLARGINE 100 UNIT/ML SUBCUTANEOUS SOLUTION
100 unit/mL | Freq: Every evening | SUBCUTANEOUS | Status: AC
Start: 2022-09-10 — End: ?

## 2022-09-10 MED ORDER — LACTATED RINGERS INTRAVENOUS SOLUTION
INTRAVENOUS | Status: DC
Start: 2022-09-10 — End: 2022-09-12
  Administered 2022-09-10 – 2022-09-12 (×4): 1000.000 mL/h via INTRAVENOUS

## 2022-09-10 MED ORDER — MORPHINE 2 MG/ML INJECTION SYRINGE
2 mg/mL | INTRAVENOUS | Status: DC | PRN
Start: 2022-09-10 — End: 2022-09-11

## 2022-09-10 MED ORDER — DIPHENHYDRAMINE 50 MG/ML INJECTION (WRAPPED E-RX)
50 mg/mL | Freq: Once | INTRAVENOUS | Status: CP
Start: 2022-09-10 — End: ?
  Administered 2022-09-10: 14:00:00 50 mL via INTRAVENOUS

## 2022-09-10 MED ORDER — ONDANSETRON HCL (PF) 4 MG/2 ML INJECTION SOLUTION
42 mg/2 mL | Freq: Four times a day (QID) | INTRAVENOUS | Status: DC | PRN
Start: 2022-09-10 — End: 2022-09-13
  Administered 2022-09-12: 12:00:00 4 mL via INTRAVENOUS

## 2022-09-10 MED ORDER — DEXTROSE 15 GRAM/60 ML ORAL LIQUID
1560 gram/60 mL | ORAL | Status: DC | PRN
Start: 2022-09-10 — End: 2022-09-13

## 2022-09-10 MED ORDER — GLUCAGON 1 MG/ML IN STERILE WATER
Freq: Once | INTRAMUSCULAR | Status: DC | PRN
Start: 2022-09-10 — End: 2022-09-13

## 2022-09-10 MED ORDER — SODIUM CHLORIDE 0.9 % (FLUSH) INJECTION SYRINGE
0.9 % | Freq: Three times a day (TID) | INTRAVENOUS | Status: DC
Start: 2022-09-10 — End: 2022-09-13
  Administered 2022-09-13: 01:00:00 0.9 mL via INTRAVENOUS

## 2022-09-10 MED ORDER — SODIUM CHLORIDE 0.9 % (FLUSH) INJECTION SYRINGE
0.9 % | INTRAVENOUS | Status: DC | PRN
Start: 2022-09-10 — End: 2022-09-13

## 2022-09-10 MED ORDER — SODIUM CHLORIDE 0.9 % BOLUS (NEW BAG)
0.9 % | Freq: Once | INTRAVENOUS | Status: CP
Start: 2022-09-10 — End: ?
  Administered 2022-09-10: 15:00:00 0.9 mL/h via INTRAVENOUS

## 2022-09-10 MED ORDER — ONDANSETRON 4 MG DISINTEGRATING TABLET
4 mg | Freq: Four times a day (QID) | ORAL | Status: DC | PRN
Start: 2022-09-10 — End: 2022-09-11
  Administered 2022-09-11: 02:00:00 4 mg via ORAL

## 2022-09-10 MED ORDER — IOHEXOL 350 MG IODINE/ML INTRAVENOUS SOLUTION
350 mg iodine/mL | Freq: Once | INTRAVENOUS | Status: CP | PRN
Start: 2022-09-10 — End: ?
  Administered 2022-09-10: 17:00:00 350 mL via INTRAVENOUS

## 2022-09-10 MED ORDER — QUETIAPINE IMMEDIATE RELEASE 50 MG TABLET
50 mg | Freq: Every evening | ORAL | Status: AC
Start: 2022-09-10 — End: ?

## 2022-09-10 MED ORDER — LOSARTAN 25 MG TABLET
25 mg | Freq: Every day | ORAL | Status: AC
Start: 2022-09-10 — End: ?

## 2022-09-10 MED ORDER — ONDANSETRON HCL (PF) 4 MG/2 ML INJECTION SOLUTION
42 mg/2 mL | Freq: Once | INTRAVENOUS | Status: CP
Start: 2022-09-10 — End: ?
  Administered 2022-09-10: 14:00:00 4 mL via INTRAVENOUS

## 2022-09-10 MED ORDER — ENOXAPARIN 30 MG/0.3 ML SUBCUTANEOUS SYRINGE
300.3 mg/0.3 mL | Freq: Two times a day (BID) | SUBCUTANEOUS | Status: DC
Start: 2022-09-10 — End: 2022-09-13
  Administered 2022-09-11 – 2022-09-13 (×6): 30 mg/0.3 mL via SUBCUTANEOUS

## 2022-09-10 MED ORDER — MORPHINE 4 MG/ML INTRAVENOUS SOLUTION
4 mg/mL | INTRAVENOUS | Status: DC | PRN
Start: 2022-09-10 — End: 2022-09-11
  Administered 2022-09-10 – 2022-09-11 (×3): 4 mL via INTRAVENOUS

## 2022-09-10 MED ORDER — INSULIN U-100 REGULAR HUMAN 100 UNIT/ML (CORRECTION SCALE)
100 unit/mL | Freq: Four times a day (QID) | SUBCUTANEOUS | Status: DC
Start: 2022-09-10 — End: 2022-09-13
  Administered 2022-09-11 – 2022-09-13 (×4): 100 unit/mL via SUBCUTANEOUS

## 2022-09-10 NOTE — ED Notes
11:15 AM Chief Complaint Patient presents with  Abdominal Pain   Pt presents to ed for abd pain that started at mid night. Pt reports nausea and vomiting.  Pt is passing gas. Pts last bm was at 9pm last night. Pt denies any fevers, dysuria, hematuria. Pt reports hx of small bowel obstructions related to several past abd surgeries. Pt is aox4, respirations non-labored. Pt c/o upper abdominal pain. Pt reports that she has a history of SBO and is pending [place].2:45 PMPt found to have SBO and pending consults

## 2022-09-10 NOTE — ED Provider Notes
Chief Complaint Patient presents with  Dental Pain   72 year old female with lump to left lower chin/neck, has discharge draining into mouth. Has had it x 2 weeks. Pain to left ear as well. Pain and swelling worse today. Denies fevers,chills.  MDM History of diabetes, high cholesterol, lupus coming in with complaints of lump on lower cheek/chin.  Denies any oral swelling, difficulty swallowing, fevers, chills, tooth pain, trismus.Differential includes dental infection, sialoadenitis,No trismusNo posterior oropharyngeal swelling or exudateNo submandibular tendernessWill get Reader of the neck to look for infection versus abscessSialoadenitisAntibiotics and ENT referralDr. Harlin Heys available Physical ExamED Triage Vitals [09/01/22 1451]BP: 125/78Pulse: (!) 110Pulse from  O2 sat: n/aResp: 18Temp: 97.1 ?F (36.2 ?C)Temp src: n/aSpO2: 97 % BP 125/78  - Pulse (!) 110  - Temp 97.1 ?F (36.2 ?C)  - Resp 18  - SpO2 97% Physical Exam ProceduresAttestation/Critical CarePatient Reevaluation: ED Attestation: PA/APRNFace to face evaluation was performed by me in collaboration with the Advanced Practice Provider to assess for significant health threats. I provided a substantive portion of the care of this patient.  I personally performed medically appropriate history and physical exam and the MDM: 72 year old female presented emergency department complaining of left submandibular mass with pain and described purulent discharge into her mouth.  Symptoms ongoing for 3 weeks.  Similar episode many years ago.  No difficulty swallowing or opening.On physical exam patient is in no acute distress with reassuring vital signs.  Breath sounds are clear and equal bilaterally patient has a regular heart rate and rhythm.  Abdomen is soft and nontender.  Patient has tender mass to left submandibular region.  No trismus, no asymmetry of the posterior oropharynx.Impression:  Presentation consistent with infected sialoadenitis, low suspicion for Ludwig's angina, peritonsillar abscess-labs-[place] soft tissue neck-If sialoadenitis confirmed with treat with abx, sialogogues, dexamethasone x K RollinsClinical Impressions as of 09/10/22 1608 Sialadenitis  ED DispositionDischarge Trey Sailors, DO05/07/24 1837 Sherron Monday, PA05/16/24 [phone]

## 2022-09-10 NOTE — Progress Notes
Abdominal exam5PM: Evaluated with Dr. Marcy Salvo. Abdomen was soft, tender at upper midline and extending to left upper quadrant. Nonperitonitc. Patient reports it is stable from prior. Reported a small amount of flatus6:30pm: Exam unchanged from prior.7:15pm: Exam remains unchanged.Will continue to perform serial abdominal exams.Electronically Signed PZ:WCHENI Esposito, M.D.General Surgery, PGY4Available on MHB5/16/2024 Gwenevere Abbot, MDGeneral Surgery Resident, PGY-25/16/20247:31 PM

## 2022-09-10 NOTE — Utilization Review (ED)
UM Status: Managed Medicare IPAdmitted for SBO.

## 2022-09-10 NOTE — ED Notes
10:25 AM Assumed care of pt, c/o upper abd pain and dry heaves since last night. Denies changes in diet. AOX4, VSS, ABCs intact. Denies CP/SOB/HA/diarrhea/fever/cough/chills. PIV placed in L AC, labs collected and sent. Medicated per eMAR. Resting in stretcher. 10:55 AM Pt reports significant pain relief. Pending [place] scan. 11:10 AM Handoff report given to RN Luisa Hart, all questions answered. Pt resting comfortably in stretcher.

## 2022-09-11 ENCOUNTER — Encounter: Admit: 2022-09-11 | Payer: PRIVATE HEALTH INSURANCE | Primary: Internal Medicine

## 2022-09-11 DIAGNOSIS — Z5181 Encounter for therapeutic drug level monitoring: Secondary | ICD-10-CM

## 2022-09-11 LAB — CBC WITHOUT DIFFERENTIAL
BKR WAM ANALYZER ANC: 8.82 x 1000/ÂµL — ABNORMAL HIGH (ref 2.00–7.60)
BKR WAM HEMATOCRIT (2 DEC): 39 % (ref 35.00–45.00)
BKR WAM HEMOGLOBIN: 12.3 g/dL (ref 11.7–15.5)
BKR WAM MCH (PG): 25.8 pg — ABNORMAL LOW (ref 27.0–33.0)
BKR WAM MCHC: 31.5 g/dL (ref 31.0–36.0)
BKR WAM MCV: 81.8 fL (ref 80.0–100.0)
BKR WAM MPV: 10.5 fL (ref 8.0–12.0)
BKR WAM PLATELETS: 391 x1000/ÂµL (ref 150–420)
BKR WAM RDW-CV: 15.1 % — ABNORMAL HIGH (ref 11.0–15.0)
BKR WAM RED BLOOD CELL COUNT.: 4.77 M/ÂµL (ref 4.00–6.00)
BKR WAM WHITE BLOOD CELL COUNT: 11.9 x1000/ÂµL — ABNORMAL HIGH (ref 4.0–11.0)

## 2022-09-11 LAB — PROTIME AND INR
BKR INR: 2.82 — ABNORMAL HIGH (ref 0.88–1.14)
BKR PROTHROMBIN TIME: 28.2 s — ABNORMAL HIGH (ref 9.5–12.1)

## 2022-09-11 LAB — BASIC METABOLIC PANEL
BKR ANION GAP: 11 g/dL (ref 7–17)
BKR BLOOD UREA NITROGEN: 10 mg/dL (ref 8–23)
BKR BUN / CREAT RATIO: 14.3 (ref 8.0–23.0)
BKR CALCIUM: 8.4 mg/dL — ABNORMAL LOW (ref 8.8–10.2)
BKR CHLORIDE: 103 mmol/L (ref 98–107)
BKR CO2: 23 mmol/L — ABNORMAL LOW (ref 20–30)
BKR CREATININE: 0.7 mg/dL (ref 0.40–1.30)
BKR EGFR, CREATININE (CKD-EPI 2021): >60 mL/min/{1.73_m2} (ref >=60–2.2)
BKR GLUCOSE: 125 mg/dL — ABNORMAL HIGH (ref 70–100)
BKR POTASSIUM: 4.3 mmol/L (ref 3.3–5.3)
BKR SODIUM: 137 mmol/L (ref 136–144)

## 2022-09-11 LAB — PHOSPHORUS     (BH GH L LMW YH): BKR PHOSPHORUS: 2.5 mg/dL — ABNORMAL HIGH (ref 2.2–4.5)

## 2022-09-11 LAB — MAGNESIUM: BKR MAGNESIUM: 1.9 mg/dL (ref 1.7–2.4)

## 2022-09-11 MED ORDER — HYDROMORPHONE 0.5 MG/0.5 ML INJECTION SYRINGE
0.50.5 mg/ mL | INTRAVENOUS | Status: DC | PRN
Start: 2022-09-11 — End: 2022-09-12

## 2022-09-11 MED ORDER — WARFARIN THERAPY PLACEHOLDER
ORAL | Status: DC
Start: 2022-09-11 — End: 2022-09-13

## 2022-09-11 MED ORDER — HYDRALAZINE 20 MG/ML INJECTION SOLUTION
20 mg/mL | INTRAVENOUS | Status: DC | PRN
Start: 2022-09-11 — End: 2022-09-13
  Administered 2022-09-11: 06:00:00 20 mL via INTRAVENOUS

## 2022-09-11 MED ORDER — QUETIAPINE IMMEDIATE RELEASE 25 MG TABLET
25 mg | Freq: Every evening | ORAL | Status: DC
Start: 2022-09-11 — End: 2022-09-13
  Administered 2022-09-12 – 2022-09-13 (×2): 25 mg via ORAL

## 2022-09-11 MED ORDER — DIPHENHYDRAMINE 50 MG/ML INJECTION (WRAPPED E-RX)
50 mg/mL | Freq: Four times a day (QID) | INTRAVENOUS | Status: DC | PRN
Start: 2022-09-11 — End: 2022-09-13
  Administered 2022-09-11: 14:00:00 50 mL via INTRAVENOUS

## 2022-09-11 MED ORDER — VENLAFAXINE ER 75 MG CAPSULE,EXTENDED RELEASE 24 HR
75 mg | ORAL | Status: DC
Start: 2022-09-11 — End: 2022-09-13
  Administered 2022-09-11 – 2022-09-13 (×3): 75 mg via ORAL

## 2022-09-11 MED ORDER — DIPHENHYDRAMINE 50 MG/ML INJECTION (WRAPPED E-RX)
50 mg/mL | Freq: Once | INTRAVENOUS | Status: CP
Start: 2022-09-11 — End: ?
  Administered 2022-09-11: 07:00:00 50 mL via INTRAVENOUS

## 2022-09-11 MED ORDER — LOSARTAN 25 MG TABLET
25 mg | Freq: Every day | ORAL | Status: DC
Start: 2022-09-11 — End: 2022-09-13
  Administered 2022-09-11 – 2022-09-13 (×3): 25 mg via ORAL

## 2022-09-11 MED ORDER — HYDROMORPHONE 1 MG/ML INJECTION SYRINGE
1 mg/mL | INTRAVENOUS | Status: DC | PRN
Start: 2022-09-11 — End: 2022-09-12
  Administered 2022-09-11: 13:00:00 1 mL via INTRAVENOUS

## 2022-09-11 NOTE — Plan of Care
Problem: Adult Inpatient Plan of CareGoal: Readiness for Transition of CareOutcome: Initial problem identification Plan of Care Overview/ Patient Status    Per chart - Maria [name] is an 72 y.o. 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 [place] shows a recurrent SBO with transition point in the lower abdomen. CM met patient at bedside, introduced self and role in care management. Patient lives with husband in a three level house, railings present on stair case. Patient ambulates with cane, no HCS, no DC needs, husband transports. Case Management Screening  Flowsheet Row Most Recent Value Case Management Screening: Chart review completed. If YES to any question below then proceed to CM Eval/Plan  Is there a change in their cognitive function No Do you anticipate that the pt will have any discharge needs requiring CM intervention? No Has there been a readmission within the last 30 days No Were there services prior to admission ( Examples: Assisted Living, HD, Homecare, Extended Care Facility, Methadone, SNF, Outpatient Infusion Center) No Negative/Positive Screen Negative Screening: Case Management department will follow patient's progress and discuss plan of care with treatment team. Case Manager Attestation  I have reviewed the medical record and completed the above screen. CM staff will follow patient's progress and discuss the plan of care with the Treatment Team. Yes  Catha Nottingham RN BSNCare ManagerYale Mount Nittany Medical Center HospitalMHB [phone]

## 2022-09-11 NOTE — Plan of Care
[facility]-SrcSpiritual Care NoteAssessment:  Religion:CatholicInformation Obtained From: Patient; Medical RecordTypes of Relational Support: Spouse/Partner; Personnel officer of Relational Support: Family In-Town and InvolvedObservation or Mood of Visit: PowerlessIssues Raised by Patient: Concern about Effect of Illness on SelfSofia says she would love a spiritual care visit now, but she is in such pain that it is not a good time and requests that a chaplain check back in with her tomorrow/when possible. Intervention:  Referral Source: NurseResponding Chaplain: Armed forces operational officer; On-Call ChaplainLanguage or Special Accommodation Rendered?: No (Communicated together in English)Visit and Intervention Type: Consult Spiritual care interventions provided: Introduction to [employer]; Empathy Outcome: With the help of the chaplain, patient/loved one(s): Established a Relationship with the Chaplain; Obtained More Information or Help from the Health Care Team Plan: Referrals Made To: Unit ChaplainPlaced patient on Spiritual Care Shared List so a chaplain may follow up when patient is available.Harrietta Guardian, M.Div.Chaplain InternMon & Tues 8am-12:30pmOn-Call ChaplainSRC [phone] Chaplains are available 24/7 for emotional, spiritual, religious, and existential support for all patients, their loved ones, and staff - for those who are religious and those who are not.When to Call a Chaplain:N: New diagnosis E: Emotional / spiritual distress E: Existential distress D: Decision making / goals of care meetingsS: Support for staff and patients' loved ones   C: Compromised copingA: Anxiety / stress / grief / loneliness R: Religious / cultural / ritual needsE: End-of-life care / death/dying 18-Sep-2022 12:28 AM

## 2022-09-11 NOTE — Plan of Care
Plan of Care Overview/ Patient Status    4p-8p Note. From ED ata about 430pm. VSS afebrile.complained of medial abdominal pain 8/10- medicated with morphine with good effect. Abdomen distended, tender, positive bowel sounds. No compalints of nausea. Remains NPO. Blood sugars stable.  IV infusing without s/s of infiltration. Oriented to unit and plan of care, verbalizing understanding

## 2022-09-11 NOTE — Plan of Care
Plan of Care Overview/ Patient Status    Pt is alert and oriented. OOB ambulating unit multiple times independently. Pt has large well healed scar to midline abdomen from previous surgeries. Abdomen very distended especially in upper quadrants. Pt now states she is passing a lot of gas. Pt has not had a BM so far this shift. Call bell in reach. Pt states pain is well controlled at this time. Will continue to monitor. Safety maintained.Addendum: Pt states that she had a BM that was hard and caused a little bit of BRBPR.

## 2022-09-11 NOTE — Other
PHARMACY-ASSISTED MEDICATION REPORTPharmacist review of the best possible medication history obtained by the pharmacy medication history technician has been performed.  I have updated the home medication list and identified the following information that may be relevant to this admission.NOTES/RECOMMENDATIONSRecommend restarting home gabapentin, losartan, quetiapine, venlafaxine, pravastatin, and Januvia as clinically appropriateOn warfarin at home, recommend restarting if no plans for surgery       Prior to Admission Medications Medication Name Sig Taking? Patient Reported   acetaminophen (TYLENOL) 500 mg tabletLast dose:  --  Take 1 tablet (500 mg total) by mouth every 4 (four) hours as needed. Yes     amoxicillin-clavulanate (AUGMENTIN) 875-125 mg per tabletLast dose:  --  Take 1 tablet by mouth every 12 (twelve) hours for 10 days. Yes     biotin 1 mg tabletLast dose: UnknownLast Medication Note: >> Doloris Hall   XBJ Jul 16, 2018  6:52 PM Entered by Ellard Artis, Amarylis, CPHT Sat Jul 16, 2018 4782 Take 1 tablet (1,000 mcg total) by mouth daily.   Yes   cholecalciferol, vitamin D3, 125 mcg (5,000 unit) tabletLast dose: UnknownLast Medication Note: >> Durwin Glaze   NFA Nov 21, 2016 10:17 AM Entered by Melony Overly, PharmD Sat Nov 21, 2016 1017 Take 1 tablet (5,000 Units total) by mouth every other day.   Yes   diphenhydramine HCl (BENADRYL ALLERGY ORAL)Last dose:  -- Last Medication Note: >> Buckner Malta Mar 05, 2021  1:51 PM Entered by Roma Kayser, CPHT Wed Mar 05, 2021 1351 Take 1 tablet by mouth as needed. Yes Yes   gabapentin (NEURONTIN) 300 mg capsuleLast dose:  --  Take 1 capsule (300 mg total) by mouth nightly. Yes Yes   glipiZIDE (GLUCOTROL) 5 mg Immediate Release tabletLast dose:  --  Take 1 tablet (5 mg total) by mouth daily before breakfast. Yes     insulin glargine (LANTUS) 100 unit/mL vialLast dose:  --  Inject 10 Units under the skin nightly. Yes Yes   JARDIANCE 25 mg tabletLast dose:  --  Take 1 tablet (25 mg total) by mouth daily. Yes Yes   losartan (COZAAR) 25 mg tabletLast dose:  --  Take 1 tablet (25 mg total) by mouth daily. Yes Yes   meclizine (ANTIVERT) 12.5 mg tabletLast dose: UnknownLast Medication Note: >> Buckner Malta Mar 05, 2021  1:48 PM Entered by Roma Kayser, CPHT Wed Mar 05, 2021 1348 Take 1 tablet (12.5 mg total) by mouth daily as needed for Dizziness       melatonin 10 mg TabLast dose:  -- Last Medication Note: >> Buckner Malta Mar 05, 2021  1:51 PM Entered by Roma Kayser, CPHT Wed Mar 05, 2021 1351 Take 10 mg by mouth nightly as needed.  Yes Yes   multivitamin tabletLast dose:  -- Last Medication Note: >> Buckner Malta Mar 05, 2021  1:51 PM Entered by Roma Kayser, CPHT Wed Mar 05, 2021 1351 Take 1 tablet by mouth daily. Yes Yes   olopatadine (PATADAY) 0.2 % ophthalmic solutionLast dose:  --  Place 1 drop into both eyes daily.      **Flagged for Removal** ondansetron (ZOFRAN) 4 MG tabletLast dose:  -- Last Medication Note: >> Buckner Malta Mar 05, 2021  1:48 PM Entered by Roma Kayser, CPHT Wed Mar 05, 2021 1348 Take 1 tablet (4 mg total) by mouth every 8 (eight) hours as needed for nausea. Yes Yes  phytonadione, vit K1, (VITAMIN K) 100 mcg tabletLast dose: Unknown Take 1 tablet (100 mcg total) by mouth daily.   Yes   polyethylene glycol (MIRALAX) 17 gram packetLast dose:  -- Last Medication Note: >> Buckner Malta Mar 05, 2021  1:53 PM Entered by Roma Kayser, CPHT Wed Mar 05, 2021 1353 Take 1 packet (17 g total) by mouth daily. Mix in 8 ounces of water, juice, soda, coffee or tea prior to taking.       pravastatin (PRAVACHOL) 40 MG tabletLast dose:  -- Last Medication Note: >> Sharol Harness Sep 10, 2022  8:01 PM Entered by Ellard Artis, Amarylis, CPHT Thu Sep 10, 2022 2001 Take 1.5 tablets (60 mg total) by mouth nightly. Yes Yes   QUEtiapine (SEROQUEL) 50 mg Immediate Release tabletLast dose:  --  Take 1 tablet (50 mg total) by mouth nightly. Yes Yes   senna (SENOKOT) 8.6 mg tabletLast dose:  -- Last Medication Note: >> Sharol Harness Sep 10, 2022  8:03 PM Entered by Ellard Artis, Amarylis, CPHT Thu Sep 10, 2022 2003 Take 2 tablets by mouth nightly.. Yes     sitaGLIPtin (JANUVIA) 100 MG tabletLast dose:  -- Last Medication Note: >> Ezzard Flax Apr 09, 2020  6:19 PM Entered by Francis Dowse, CPHT Tue Apr 09, 2020 1819 Take 1 tablet (100 mg total) by mouth daily. Yes Yes   venlafaxine (EFFEXOR XR) 150 mg XR 24 hr extended release capsuleLast dose:  -- Last Medication Note: >> Ezzard Flax Apr 09, 2020  6:19 PM Entered by Francis Dowse, CPHT Tue Apr 09, 2020 1819 Take 1 capsule (150 mg total) by mouth daily. Yes Yes   warfarin (COUMADIN) 5 mg tabletLast dose:  --  daily. Take by mouth 7.5 mg (5 mg x 1.5) every Tue, Thu; 5 mg (5 mg x 1) all other days Yes Yes   warfarin (COUMADIN) 5 mg tabletLast dose: Not TakingLast Medication Note: >> Sharol Harness Sep 10, 2022  8:02 PMMedHx Tech(Amarylis Ellard Artis, CPHT): FLAG FOR REMOVAL -  patient's LIP had changed the dose/direction of the medication.  Please remove original order using the Delete/Clean Up and utilize a new entry  [confirmed by: patient ]Entered by Ellard Artis, Charolette Forward, CPHT Thu Sep 10, 2022 2002 Take 2.5 mg on Tuesdays and Thursdays and 5 mg all other days of the week or as directed by warfarin clinicPatient not taking: Reported on 09/10/2022      **Flagged for Removal** Prior to admission medications last reviewed by Doloris Hall, CPHT on Thu Sep 10, 2022 2004 Thank you,Lurlene Ronda, PharmD5/17/20246:29 AMPhone: MHB

## 2022-09-11 NOTE — Progress Notes
[facility]           Surgical Progress NoteAttending: Kathyrn Sheriff, MDAdmit Date: 5/16/2024Interval Events:- no acute events overnight - reports passing minimal amount of gas overnight - no BM - some nausea, no episodes of vomiting overnight OBJECTIVEVitals:Temp:  [97.5 ?F (36.4 ?C)-98.3 ?F (36.8 ?C)] 98.3 ?F (36.8 ?C)Pulse:  [78-92] 82Resp:  [16-20] 18BP: (142-174)/(63-84) 151/69SpO2:  [95 %-100 %] 97 %Device (Oxygen Therapy): room airI/O's:I/O last 3 completed shifts:In: 489.8 [I.V.:489.8]Out: 600 [Urine:600]Physical Exam:Gen: NAD, A&Ox4 CV: Regular rate, hemodynamically stablePulm: Normal work of breathing on room air Abd: soft,minimally distended, non tender Ext: [facility] Labs:Recent Labs Lab 05/16/241024 05/16/241035 05/16/241716 05/17/240602 WBC 16.2*  --  14.6* 11.9* HGB 14.5  --  12.9 12.3 HCT 44.20 45.0 40.00 39.00 PLT 442*  --  408 391 Recent Labs Lab 05/16/241024 05/16/241035 05/16/241716 05/16/241745 05/16/242347 05/17/240509 05/17/240602 NA 133*  --  137  --   --   --  137 K 5.1 4.8 4.7  --   --   --  4.3 CL 96*  --  102  --   --   --  103 CO2 24  --  23  --   --   --  23 BUN 15  --  11  --   --   --  10 CREATININE 0.80 0.7 0.60  --   --   --  0.70 GLU 214* 222* 140*   < > 181* 122* 125* CALCIUM 9.7  --  8.7*  --   --   --  8.4* MG 2.1  --  2.0  --   --   --  1.9 PHOS  --   --  3.2  --   --   --  2.5  < > = values in this interval not displayed. Recent Labs Lab 05/16/241024 ALT 30 AST 33 ALKPHOS 168* BILITOT 0.3 BILIDIR <0.2 LIPASE 49 Recent Labs Lab 05/13/241338 05/16/241717 PTT  --  55.6* INR 3.16* 3.16* Diagnostics:[place] Abdomen Pelvis with IV Contrast without oral (BMI>25)Result Date: 5/16/2024Recurrent small bowel obstruction with zone of transition in the lower abdomen, likely in the setting of post hernia repair adhesions. Findings discussed by phone with Dr. Ferdinand Cava on 09/10/2022 12:37 PM. [name] Notify System Classification: Routine. Report initiated by:  Moses Manners, MD Reported and signed by: Virl Axe, MD  [facility] Radiology and Biomedical Imaging XR Chest PA or APResult Date: 09/10/2022 No acute cardiothoracic abnormality. [facility] Notify System Classification: Routine. Report initiated by:  Moses Manners, MD Reported and signed by: Bradly Bienenstock, MD  [facility] Radiology and Biomedical Imaging   ASSESSMENT:Maria [name] is an 72 y.o. 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 [address] shows a recurrent SBO with transition point in the lower abdomen. PLAN:Home meds as appropriate NPO/IVF Pain and nausea control PRN Q4H vitals Monitor abdominal exam Awaiting return of bowel functionSigned: Joline Salt, MD5/17/2024 7:25 AMBest Contact: MHB

## 2022-09-11 NOTE — Plan of Care
[facility]-SrcSpiritual Care NoteAssessment:  Religion:CatholicPatient in bed,alert,alone, welcoming,oriented,and conversant. She spoke at length about her childhood, family dynamics, strong religious values and health challenges. She has a supportive spouse and two sons, living out-of the state.Information Obtained From: PatientTypes of Relational Support: Son; Sibling; Spouse/PartnerQuality of Relational Support: Family In-Town and Involved; Family Out-of-Town but InvolvedObservation or Mood of Visit: Calm; Vulnerable; Relieved; HopefulIssues Raised by Patient: Adjustment to Illness; Shared Story of Illness; Multi-Cultural Issues; Length of IllnessHelpful Religious Practices: Life Story SharingSpiritual Resources: Sports coach; Spiritual Values; Family; Faith Intervention:  Referral Source: Other ChaplainResponding Chaplain: Other ChaplainConsulted With: Other ChaplainLanguage or Special Accommodation Rendered?: No (Communicated together in English)Visit and Intervention Type: Follow-up Spiritual care interventions provided: Companionship; Introduction to [employer]; Spiritual Support/Presence, Life Review and/or Story Listening Outcome: With the help of the chaplain, patient/loved one(s): Felt More Respected Culturally or Personally, Established a Relationship with the Chaplain; Felt Greater Comfort and/or Support Plan: Referrals Made To: Unit Chaplain Emotional/ Spiritual support if is needed.Ernestina Patches MFT, MACM, [employer]         Staff Chaplain    8.00-4.00 pm Mo-Fri Chaplains are available 24/7 for emotional, spiritual, religious, and existential support for all patients,   their loved ones, and staff ;for those who are religious and those who are not.On weekend / call  Janice Coffin [phone] to Call a Chaplain:N: New diagnosis E: Emotional / spiritual distress E: Existential distress D: Decision making / goals of care meetingsS: Support for staff and patients' loved ones   C: Compromised copingA: Anxiety / stress / grief / loneliness R: Religious / cultural / ritual needsE: End-of-life care / death/dyingVitals:  29-Sep-2022 2349 09/11/22 0510 09/11/22 0729 09/11/22 1124 SpO2: 98% 97% 94% 100%  09/11/2022 11:59 AM

## 2022-09-11 NOTE — Plan of Care
Problem: Adult Inpatient Plan of CareGoal: Plan of Care ReviewOutcome: Interventions implemented as appropriate Plan of Care Overview/ Patient Status    Pt alert and oriented x4, calm and cooperativeResp: Room air, denies SOBCV: BP elevated, Provider Dandu notified, hydralazine given.GU: Voiding adequately in the bathroomGI: complains of nausea and abdl pain, Zofran and IV morphine given with positive effects.MSK: OOB independentlySkin: pt itching all over, no rash after IV Morphine. Provider Dandu notified. IV benadryl given. Assessment done, kept NPO, call light in reach. Plan of care ongoing.

## 2022-09-11 NOTE — ED Provider Notes
Chief Complaint Patient presents with  Abdominal Pain   Pt presents to ed for abd pain that started at mid night. Pt reports nausea and vomiting.  Pt is passing gas. Pts last bm was at 9pm last night. Pt denies any fevers, dysuria, hematuria. Pt reports hx of small bowel obstructions related to several past abd surgeries. Pt is aox4, respirations non-labored. MDM ----------------------------------------------------------------------------------Emergency Medicine Resident NotePresentation and Synopsis:Pt is a 72 y.o. female with a PMH of diabetes, lupus anticoagulant on warfarin, prior SBO, hiatal hernia s/p repair who presents with complaint of constant abdominal pain that began at midnight and has persisted since then.  Has had nausea but no vomiting.  Had regular bowel movements yesterday and this morning.  Last p.o. intake at 8:00 p.m. last night.  No dysuria or hematuria.  No fever or chills.  No diarrhea or bloody stools.Medical Decision MakingDDx:  SBO, colitis, gastroenteritis, pancreatitis. ACS unlikely but will obtain trops d/t epigastric pain & nauseaPertinent Physical Exam: A&O x3, vital signs stable.  Lungs clear to auscultation bilaterally.  Regular rate and rhythm.  + periumbilical abdominal tenderness, rebound, or guarding.  No CVAT.  No lower extremity edema or tenderness.Workup:CBC, CMP, lipase, tropCourse:Given fluids, Zofran, morphine.  NPO.  [place] shows SBO with transition point in lower abdomen likely secondary to prior adhesions.  Remains hemodynamically stable, afebrile, low c/f incarceration.Dispo pending surgery recs.  Patient was signed out to incoming evening team.This patient was presented to and discussed with an attending physician and a treatment plan and disposition were collaboratively agreed upon.Carloyn Jaeger, MDPGY-1, Emergency MedicineContact: MHB**Some portions of this note may have been made with dictation software, please excuse any dictation errors**---------------------------------------------------------------------------------- Physical ExamED Triage Vitals [09/10/22 0940]BP: (!) 150/76Pulse: 90Pulse from  O2 sat: n/aResp: 20Temp: 97.5 ?F (36.4 ?C)Temp src: OralSpO2: 98 % BP (!) 175/74  - Pulse (!) 101  - Temp 98.2 ?F (36.8 ?C) (Oral)  - Resp 19  - Ht 4' 10 (1.473 m)  - Wt 68.9 kg  - SpO2 94%  - BMI 31.77 kg/m? 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  Last bm yesterday. Not obstipated. Feels like previous SBOOn exam: hypertensiveAttending Supervised: ResidentI saw and examined the patient. I agree with the findings and plan of care as documented in the resident's note except as noted below. Additional acute and/or chronic problems addressed: I considered an acute or life-threatening diagnosis during this encounter.I reviewed previous notes if availableConsidered sbo, ordered [place].An acute or life threatening diagnosis was considered during this evaluation.Considered hospitalization  Otherwise provided routine care.[place] shows SBO.  Admit surgeryRyan Tayana Shankle MDComments as of 09/11/22 1610 Thu Sep 10, 2022 1236 SBO at same location [SS] 1236 Wet read: SBO w similar transition point [RC] 1244 Spoke with general surgery consult resident for evaluation management of SBO. [SS] 1413 Waiting for attending surgeon, likely colorectalReceived in sign out [MN]  Comments User Index[MN] Vladimir Faster, MD[RC] Scherrie Bateman, MD[SS] Carloyn Jaeger, MD   Clinical Impressions as of 09/11/22 [phone] Small bowel obstruction (HC Code) [facility] CODE) [facility] Code) ED DispositionAdmit Scherrie Bateman, MD05/16/24 1311 Carloyn Jaeger, MDResident05/16/24 1544 Scherrie Bateman, MD05/17/24 [phone]

## 2022-09-12 LAB — CBC WITHOUT DIFFERENTIAL
BKR INR: 12.1 g/dL — ABNORMAL HIGH (ref 11.7–15.5)
BKR PROTHROMBIN TIME: 4.58 M/ÂµL — ABNORMAL HIGH (ref 4.00–6.00)
BKR WAM ANALYZER ANC: 5.63 x 1000/ÂµL (ref 2.00–7.60)
BKR WAM HEMATOCRIT (2 DEC): 37.6 % (ref 35.00–45.00)
BKR WAM HEMOGLOBIN: 12.1 g/dL (ref 11.7–15.5)
BKR WAM MCH (PG): 26.4 pg — ABNORMAL LOW (ref 27.0–33.0)
BKR WAM MCHC: 32.2 g/dL (ref 31.0–36.0)
BKR WAM PLATELETS: 333 x1000/ÂµL (ref 150–420)
BKR WAM RDW-CV: 15 % (ref 11.0–15.0)
BKR WAM RED BLOOD CELL COUNT.: 4.58 M/ÂµL (ref 4.00–6.00)
BKR WAM WHITE BLOOD CELL COUNT: 8.3 x1000/ÂµL (ref 4.0–11.0)

## 2022-09-12 LAB — BASIC METABOLIC PANEL
BKR ANION GAP: 11 (ref 7–17)
BKR BLOOD UREA NITROGEN: 9 mg/dL (ref 8–23)
BKR BUN / CREAT RATIO: 12.9 (ref 8.0–23.0)
BKR CALCIUM: 8.5 mg/dL — ABNORMAL LOW (ref 8.8–10.2)
BKR CHLORIDE: 103 mmol/L — ABNORMAL LOW (ref 98–107)
BKR CO2: 25 mmol/L (ref 20–30)
BKR CREATININE: 0.7 mg/dL (ref 0.40–1.30)
BKR EGFR, CREATININE (CKD-EPI 2021): >60 mL/min/{1.73_m2} (ref >=60)
BKR GLUCOSE: 89 mg/dL (ref 70–100)
BKR POTASSIUM: 4.3 mmol/L (ref 3.3–5.3)
BKR SODIUM: 139 mmol/L (ref 136–144)
BKR WAM MCV: 4.3 mmol/L (ref 3.3–5.3)
BKR WAM MPV: 9 mg/dL (ref 8–23)

## 2022-09-12 LAB — MAGNESIUM: BKR MAGNESIUM: 2 mg/dL (ref 1.7–2.4)

## 2022-09-12 LAB — PHOSPHORUS     (BH GH L LMW YH): BKR PHOSPHORUS: 2.5 mg/dL (ref 2.2–4.5)

## 2022-09-12 MED ORDER — WARFARIN 2.5 MG TABLET
2.5 mg | ORAL | Status: DC
Start: 2022-09-12 — End: 2022-09-12

## 2022-09-12 MED ORDER — ONDANSETRON HCL (PF) 4 MG/2 ML INJECTION SOLUTION
42 mg/2 mL | Freq: Once | INTRAVENOUS | Status: CP
Start: 2022-09-12 — End: ?
  Administered 2022-09-12: 23:00:00 4 mL via INTRAVENOUS

## 2022-09-12 MED ORDER — ACETAMINOPHEN 325 MG TABLET
325 mg | Freq: Four times a day (QID) | ORAL | Status: DC | PRN
Start: 2022-09-12 — End: 2022-09-13

## 2022-09-12 MED ORDER — DEXTROSE 5 % AND 0.45 % SODIUM CHLORIDE INTRAVENOUS SOLUTION
INTRAVENOUS | Status: DC
Start: 2022-09-12 — End: 2022-09-12
  Administered 2022-09-12: 12:00:00 1000.000 mL/h via INTRAVENOUS

## 2022-09-12 MED ORDER — WARFARIN 5 MG TABLET
5 mg | ORAL | Status: DC
Start: 2022-09-12 — End: 2022-09-12

## 2022-09-12 NOTE — Plan of Care
Plan of Care Overview/ Patient Status    1900-0700- Patient alert and oriented times 4, pleasant and cooperative. Reports mild discomfort to the upper quadrants of the abdomen. Upper quadrants firm, tender. +bowel sounds. +flatus.  1 hard stool overnight. IVF continue. No nausea or vomiting. OOB to void. Reports feeling hungry. Remains NPO. 0000 blood sugar 79mg /dl, reports feeling a little funny, d10 given via iv with repeat BS 137mg /dl. All care explained. Safety measures in place. Will continue to monitor. Rondell Reams, RN

## 2022-09-12 NOTE — Plan of Care
Problem: Adult Inpatient Plan of CareGoal: Plan of Care Review5/18/2024 [phone] by Eather Colas, RNOutcome: Interventions implemented as appropriateFlowsheets (Taken 09/12/2022 0846)Progress: improvingPlan of Care Reviewed With: patient5/18/2024 0846 by Eather Colas, RNOutcome: Interventions implemented as appropriate Plan of Care Overview/ Patient Status    Assumed care 0700. Patient A&O x4, calm/ cooperative with care. VSS, on RA. No c/o pain/ SOB. C/O intermittent nausea, PRN zofran given per MAR + effect. Meds administered per MAR. Advanced to CLD, tolerating. Abdomen distended, soft/ tender. BS audible, RUQ, LUQ hypoactive. Confirmed BM x2 this admission, passing minimal flatus. Lungs CTA. Skin intact. Independent in room. IVF infusing per MAR. BS Q6. Safety maintained, call bell within reach. Will resume POC.

## 2022-09-13 DIAGNOSIS — K56609 Unspecified intestinal obstruction, unspecified as to partial versus complete obstruction: Secondary | ICD-10-CM

## 2022-09-13 DIAGNOSIS — Z7901 Long term (current) use of anticoagulants: Secondary | ICD-10-CM

## 2022-09-13 DIAGNOSIS — E119 Type 2 diabetes mellitus without complications: Secondary | ICD-10-CM

## 2022-09-13 DIAGNOSIS — E7800 Pure hypercholesterolemia, unspecified: Secondary | ICD-10-CM

## 2022-09-13 DIAGNOSIS — Z881 Allergy status to other antibiotic agents status: Secondary | ICD-10-CM

## 2022-09-13 DIAGNOSIS — F32A Depression, unspecified: Secondary | ICD-10-CM

## 2022-09-13 DIAGNOSIS — Z794 Long term (current) use of insulin: Secondary | ICD-10-CM

## 2022-09-13 DIAGNOSIS — E1165 Type 2 diabetes mellitus with hyperglycemia: Secondary | ICD-10-CM

## 2022-09-13 DIAGNOSIS — Z6831 Body mass index (BMI) 31.0-31.9, adult: Secondary | ICD-10-CM

## 2022-09-13 DIAGNOSIS — D6862 Lupus anticoagulant syndrome: Secondary | ICD-10-CM

## 2022-09-13 DIAGNOSIS — E669 Obesity, unspecified: Secondary | ICD-10-CM

## 2022-09-13 DIAGNOSIS — Z79899 Other long term (current) drug therapy: Secondary | ICD-10-CM

## 2022-09-13 DIAGNOSIS — E871 Hypo-osmolality and hyponatremia: Secondary | ICD-10-CM

## 2022-09-13 DIAGNOSIS — Z933 Colostomy status: Secondary | ICD-10-CM

## 2022-09-13 DIAGNOSIS — Z7984 Long term (current) use of oral hypoglycemic drugs: Secondary | ICD-10-CM

## 2022-09-13 DIAGNOSIS — Z885 Allergy status to narcotic agent status: Secondary | ICD-10-CM

## 2022-09-13 DIAGNOSIS — Z91040 Latex allergy status: Secondary | ICD-10-CM

## 2022-09-13 LAB — CBC WITHOUT DIFFERENTIAL
BKR WAM ANALYZER ANC: 5.61 x 1000/ÂµL (ref 2.00–7.60)
BKR WAM HEMATOCRIT (2 DEC): 36.1 % (ref 35.00–45.00)
BKR WAM HEMOGLOBIN: 11.4 g/dL — ABNORMAL LOW (ref 11.7–15.5)
BKR WAM MCH (PG): 26.3 pg — ABNORMAL LOW (ref 27.0–33.0)
BKR WAM MCHC: 31.6 g/dL (ref 31.0–36.0)
BKR WAM MCV: 83.2 fL (ref 80.0–100.0)
BKR WAM MPV: 10.7 fL (ref 8.0–12.0)
BKR WAM PLATELETS: 311 x1000/ÂµL (ref 150–420)
BKR WAM RDW-CV: 15 % (ref 11.0–15.0)
BKR WAM RED BLOOD CELL COUNT.: 4.34 M/ÂµL — ABNORMAL HIGH (ref 4.00–6.00)
BKR WAM WHITE BLOOD CELL COUNT: 8.2 x1000/ÂµL (ref 4.0–11.0)

## 2022-09-13 LAB — BASIC METABOLIC PANEL
BKR ANION GAP: 9 % (ref 7–17)
BKR BLOOD UREA NITROGEN: 14 mg/dL (ref 8–23)
BKR BUN / CREAT RATIO: 20 (ref 8.0–23.0)
BKR CALCIUM: 8.6 mg/dL — ABNORMAL LOW (ref 8.8–10.2)
BKR CHLORIDE: 105 mmol/L — ABNORMAL LOW (ref 98–107)
BKR CO2: 24 mmol/L (ref 20–30)
BKR CREATININE: 0.7 mg/dL (ref 0.40–1.30)
BKR EGFR, CREATININE (CKD-EPI 2021): >60 mL/min/{1.73_m2} (ref >=60)
BKR GLUCOSE: 115 mg/dL — ABNORMAL HIGH (ref 70–100)
BKR POTASSIUM: 4.6 mmol/L (ref 3.3–5.3)
BKR SODIUM: 138 mmol/L (ref 136–144)

## 2022-09-13 LAB — PHOSPHORUS     (BH GH L LMW YH): BKR PHOSPHORUS: 3 mg/dL (ref 2.2–4.5)

## 2022-09-13 LAB — MAGNESIUM: BKR MAGNESIUM: 1.9 mg/dL (ref 1.7–2.4)

## 2022-09-13 LAB — PROTIME AND INR
BKR INR: 1.37 — ABNORMAL HIGH (ref 0.88–1.14)
BKR PROTHROMBIN TIME: 14.4 s — ABNORMAL HIGH (ref 9.5–12.1)

## 2022-09-13 MED ORDER — WARFARIN 2.5 MG TABLET
2.5 mg | ORAL | Status: DC
Start: 2022-09-13 — End: 2022-09-13

## 2022-09-13 MED ORDER — WARFARIN 5 MG TABLET
5 mg | ORAL | Status: DC
Start: 2022-09-13 — End: 2022-09-13

## 2022-09-13 NOTE — Plan of Care
Problem: Adult Inpatient Plan of CareGoal: Plan of Care ReviewOutcome: Outcome(s) achieved Plan of Care Overview/ Patient Status    Patient teaching provided regarding diet advancement, activity level, medication regime and follow up care. Patient tolerating a diet without GI distress, positive flatus and BM. Patient cleared for discharge and provided with written and verbal DC instructions. Rise Patience, RN

## 2022-09-13 NOTE — Discharge Instructions
Discharge InstructionsThe hospital and staff members extend their best wishes to you as you are discharged. Because we are most concerned with your health, we suggest you carefully read the following instructions.Diet:   - Regular dietPain Control:- please take tylenol for pain control, if pain remains uncontrolled, please call office or go to the emergency room Special Instructions: - Call MD or go to the ED if you experience a fever greater than 101.4, shaking chills,, intractable nausea/vomiting for more than 15 minutes, chest pain, shortness of breath, or pain not controlled with oral pain medications.Please resume your home warfarin as originally instructed

## 2022-09-13 NOTE — Discharge Summary
[facility]Src[facility]/Surg Discharge SummaryPatient Data:  Patient Name: Maria [name] Admit date: 09/10/2022 Age: 72 y.o. Discharge date: 09/13/2022 DOB: 1950/05/23	 Discharge Attending Physician: Kathyrn Sheriff, MD  MRN: [id number]	 Discharged Condition: good PCP: Cena Benton, MD Disposition: Home  Principal Diagnosis: Recurrent small bowel obstructionComorbidities Comorbidities present on admission:Class 1 obesity noted, with BMI (Calculated): 31.8.  Coagulopathic due to prescription of anticoagulant medication prior to admission.Diabetes noted on admission, last Hgb A1c: none in the last 90 days%.Secondary diagnoses occurring during hospitalization:HypocalcemiaHyponatremia Issues to be Addressed Post Discharge: Issues to be Addressed Post Discharge:Follow up as neededPending Labs and Tests: Follow-up Information:[facility] and [address] [place] [phone] needed Future Appointments Date Time Provider Department Center 09/15/2022  2:00 PM Nepomuceno, Rande Lawman, APRN Endo None 10/13/2022  1:30 PM Betsy Coder, Shanda Bumps, MD [facility] EYE OPH COMP 11/04/2022 11:45 AM [facility] MA NP 1268 MAM2 [facility] MAMMO [facility] Rad 11/24/2022  1:15 PM [facility] DRAW STATION [place] ORANGE DS ORANGE [facility] LAB 11/24/2022  1:30 PM Kriste Basque, NP SMIL ORANGE [facility] CAD 11/25/2022  2:20 PM Beckey Rutter, Remi Haggard, MD ENT [facility] - OS 12/01/2022 10:45 AM [facility] PODIATRY SCHEDULE PDY None Hospital Course: Hospital Course: Patient is a 72 year old female with a past medical history of DM, HLD, 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  Her most recent admission for SBO was in November of 2022 and was managed conservatively.  She reported that this episode began the day prior to admission at midnight when she started having diffuse, cramping abdominal pain.  That morning she started developing nausea.  She vomited 3 times on the day of admission.  Her last bowel movement was yesterday at 7:00 p.m.  In the ED, she was afebrile and hemodynamically stable.  Her abdomen was distended with mild diffuse tenderness to palpation.  Labs demonstrated a WBC of 16.2.  [place] shows a recurrent small-bowel obstruction with a transition point in the lower abdomen. Patient was admitted to the surgical service for conservative management of recurrent small bowel obstruction. On HD1, she remained NPO awaiting return of bowel function. On HD2, she was passing flatus and having bowel movements. She was advanced to a regular diet which she tolerated. On HD3, she continued to have bowel function, her pain was well controlled, and she was tolerating her diet. At this time, she was deemed medically stable for discharge and instructed to follow up as needed. Inpatient Consultants and summary of recommendations: nonePertinent Procedures or Surgeries: none Pertinent lab findings and test results: Objective: Recent Labs Lab 05/17/240602 05/18/240605 05/19/240604 WBC 11.9* 8.3 8.2 HGB 12.3 12.1 11.4* HCT 39.00 37.60 36.10 PLT 391 333 311  Recent Labs Lab 05/16/241024 05/16/241716 NEUTROPHILS 86.2* 83.6*  Recent Labs Lab 05/17/240602 05/17/241159 05/18/240605 05/18/241214 05/19/240604 NA 137  --  139  --  138 K 4.3  --  4.3  --  4.6 CL 103  --  103  --  105 CO2 23  --  25  --  24 BUN 10  --  9  --  14 CREATININE 0.70  --  0.70  --  0.70 GLU 125*   < > 89   < >  115* ANIONGAP 11  --  11  --  9  < > = values in this interval not displayed.  Recent Labs Lab 05/17/240602 05/18/240605 05/19/240604 CALCIUM 8.4* 8.5* 8.6* MG 1.9 2.0 1.9 PHOS 2.5 2.5 3.0  Recent Labs Lab 05/16/241024 ALT 30 AST 33 ALKPHOS 168* BILITOT 0.3 BILIDIR <0.2  Recent Labs Lab 05/16/241717 05/17/240828 05/18/240605 05/19/240604 PTT 55.6*  --   --   --  LABPROT 31.4* 28.2* 20.9* 14.4* INR 3.16* 2.82* 2.05* 1.37*  Culture Information:No results for input(s): LABBLOO, LABURIN, LOWERRESPIRA in the last 168 hours.Imaging: [place] ABDOMEN PELVIS W IV CONTRASTHISTORY: Bowel obstruction suspected COMPARISON: [place] ABDOMEN PELVIS W IV CONTRAST 2022-11-09TECHNIQUE: Kelly images of the abdomen and pelvis were obtained from the diaphragms to the pubic symphysis after the administration of intravenous contrast.  IV CONTRAST:  80 cc Omnipaque 350 FINDINGS:LUNG BASES: Bibasilar atelectasis. Focal diaphragmatic eventration again noted.  LIVER: UnremarkableGALLBLADDER: Unremarkable.SPLEEN: Unremarkable.PANCREAS: Unremarkable.ADRENALS: Unremarkable.KIDNEYS: Unremarkable. BOWEL: Patient again noted to be status post ventral hernia repair. Multiple loops of bowel are noted to be adhesed to the ventral abdominal wall. There is recurrent small bowel obstruction with the transition point in lower abdomen. There is associated small bowel wall mild hyperenhancement, mesenteric edema and trace intra-abdominal fluid. No areas of bowel wall hypoenhancement or extraluminal gas locules noted. Status post hemicolectomy. PERITONEUM: Trace intra-abdominal fluid between small bowel loops. LYMPH NODES: Unremarkable. VESSELS: Unremarkable. URINARY BLADDER: Unremarkable. PELVIS: Unremarkable. BONES & SOFT TISSUE: Unremarkable.  IMPRESSION:Recurrent small bowel obstruction with zone of transition in the lower abdomen, likely in the setting of post hernia repair adhesions. Findings discussed by phone with Dr. Ferdinand Cava on 09/10/2022 12:37 PM.  [name] Notify System Classification: Routine. Report initiated by:  Moses Manners, MD Reported and signed by: Virl Axe, MD  [facility] and Biomedical [facility] Diet:  RegularMobility: Highest Level of mobility - ACTUAL: Mobility Level 7, Walk 25+ feet, AM PAC 22-23  Physical Exam Discharge vitals: Temp:  [97.6 ?F (36.4 ?C)-98.5 ?F (36.9 ?C)] 97.9 ?F (36.6 ?C)Pulse:  [72-96] 74Resp:  [17-18] 17BP: (100-130)/(54-83) 128/54SpO2:  [95 %-99 %] 96 %Device (Oxygen Therapy): room air Discharge Physical Exam: Gen: NAD, resting comfortably in bed CV: RRR Pulm: breathing comfortably on RA Abd: soft, mildly distended, nontenderExt: WWP, MAE Allergies Allergies Allergen Reactions  Morphine Hives and Itching   Pt tolerated morphine during admission 08/08/16-08/09/16.  Pt premedicated with diphenhydramine  Biaxin  [Clarithromycin]   Latex, Natural Rubber Rash  PMH PSH Past Medical History: Diagnosis Date  Acute ischemic colitis (HC Code) (HC CODE) (HC Code) 2015  Depression   Diabetes mellitus (HC Code)   Hypercholesteremia   Lupus anticoagulant disorder (HC Code)   Rosacea   Ventral hernia with bowel obstruction   Vertigo   Past Surgical History: Procedure Laterality Date  ABDOMINAL ADHESION SURGERY  2016  BLEPHAROPLASTY Bilateral 05/2016  COLOSTOMY  05/27/13  COLOSTOMY CLOSURE    HYSTEROSCOPY W/ POLYPECTOMY  06/18/2020  KNEE LIGAMENT RECONSTRUCTION Right 2004  LAPAROSCOPIC NISSEN FUNDOPLICATION    2000 ?  SINUS SURGERY    THYROID BIOPSY    TUBAL LIGATION    UMBILICAL HERNIA REPAIR    VENTRAL HERNIA REPAIR    Social History Family History Social History Tobacco Use  Smoking status: Never Smokeless tobacco: Never Substance Use Topics  Alcohol use: Not Currently  Family History Problem Relation Age of Onset  Bladder cancer Mother   Colon cancer Paternal Aunt   Colon cancer Maternal Cousin  died of colona cancer at age 41  Breast cancer Maternal Cousin       2 cousins with breast cancer  Colon cancer Father   Discharge Medications: Discharge: Current Discharge Medication List  CONTINUE these medications which have NOT CHANGED  Details acetaminophen (TYLENOL) 500 mg tablet Take 1 tablet (500 mg total) by mouth every 4 (four) hours as needed.Qty: 30 tablet, Refills: 11  diphenhydramine HCl (BENADRYL ALLERGY ORAL) Take 1 tablet by mouth as needed.  gabapentin (NEURONTIN) 300 mg capsule Take 1 capsule (300 mg total) by mouth nightly.  glipiZIDE (GLUCOTROL) 5 mg Immediate Release tablet Take 1 tablet (5 mg total) by mouth daily before breakfast.Qty: 90 tablet, Refills: 3  insulin glargine (LANTUS) 100 unit/mL vial Inject 10 Units under the skin nightly.  JARDIANCE 25 mg tablet Take 1 tablet (25 mg total) by mouth daily.  losartan (COZAAR) 25 mg tablet Take 1 tablet (25 mg total) by mouth daily.  melatonin 10 mg Tab Take 10 mg by mouth nightly as needed.   multivitamin tablet Take 1 tablet by mouth daily.  ondansetron (ZOFRAN) 4 MG tablet Take 1 tablet (4 mg total) by mouth every 8 (eight) hours as needed for nausea.  pravastatin (PRAVACHOL) 40 MG tablet Take 1.5 tablets (60 mg total) by mouth nightly.  QUEtiapine (SEROQUEL) 50 mg Immediate Release tablet Take 1 tablet (50 mg total) by mouth nightly.  senna (SENOKOT) 8.6 mg tablet Take 2 tablets by mouth nightly.Rob Bunting: 30 tablet, Refills: 11  Associated Diagnoses: SBO (small bowel obstruction) (HC Code) (HC CODE) (HC Code)  sitaGLIPtin (JANUVIA) 100 MG tablet Take 1 tablet (100 mg total) by mouth daily.  venlafaxine (EFFEXOR XR) 150 mg XR 24 hr extended release capsule Take 1 capsule (150 mg total) by mouth daily.  !! warfarin (COUMADIN) 5 mg tablet daily. Take by mouth 7.5 mg (5 mg x 1.5) every Tue, Thu; 5 mg (5 mg x 1) all other days  biotin 1 mg tablet Take 1 tablet (1,000 mcg total) by mouth daily.  cholecalciferol, vitamin D3, 125 mcg (5,000 unit) tablet Take 1 tablet (5,000 Units total) by mouth every other day.  meclizine (ANTIVERT) 12.5 mg tablet Take 1 tablet (12.5 mg total) by mouth daily as needed for DizzinessQty: 30 tablet, Refills: 1  Associated Diagnoses: Vertigo  olopatadine (PATADAY) 0.2 % ophthalmic solution Place 1 drop into both eyes daily.Qty: 2.5 mL, Refills: 6  Associated Diagnoses: Allergic conjunctivitis of both eyes  phytonadione, vit K1, (VITAMIN K) 100 mcg tablet Take 1 tablet (100 mcg total) by mouth daily.  polyethylene glycol (MIRALAX) 17 gram packet Take 1 packet (17 g total) by mouth daily. Mix in 8 ounces of water, juice, soda, coffee or tea prior to taking.Qty: 14 each, Refills: 2  !! warfarin (COUMADIN) 5 mg tablet Take 2.5 mg on Tuesdays and Thursdays and 5 mg all other days of the week or as directed by warfarin clinicQty: 90 tablet, Refills: 3   !! - Potential duplicate medications found. Please discuss with provider.  STOP taking these medications   amoxicillin-clavulanate (AUGMENTIN) 875-125 mg per tablet    Electronically Signed:Shannon Maxon, PA Burna Sis, MD 09/13/2022 9:03 AM

## 2022-09-13 NOTE — Plan of Care
Problem: Adult Inpatient Plan of CareGoal: Plan of Care Review5/19/2024 0138 by Johnnette Litter, RNOutcome: Interventions implemented as appropriate Plan of Care Overview/ Patient Status    09/13/2022 1:38 AM (1900-0700)Pt is Alert and Oriented x4, pleasant, cooperative. No complaints of pain. Medications and POC explained, pt verbalized understanding. VSS on RA. Pt denies CP/SOB, Abdomen distended, soft/tender, +BS. 3 BM's this shift, see flowsheets for description. Tolerating P.O. intake, denies N/V/D. Pt OOB independently to bathroom voiding adequately. All safety precautions maintained with bed in lowest position, personal belongings at bedside, and call bell within reach. Pt expresses no other needs at this time. Please see flowsheets for further documentation. Johnnette Litter, RNBP 119/64  - Pulse 72  - Temp 97.6 ?F (36.4 ?C) (Oral)  - Resp 17  - Ht 4' 10 (1.473 m)  - Wt 68.9 kg  - SpO2 95%  - BMI 31.77 kg/m?

## 2022-09-13 NOTE — Progress Notes
[facility]           Surgical Progress NoteAttending: Kathyrn Sheriff, MDAdmit Date: 5/16/2024Interval Events:- no acute events overnight - bowel movement reported at 5:57PM- some nausea, no episodes of vomiting overnight OBJECTIVEVitals:Temp:  [36.8 ?C-37.1 ?C] 36.9 ?CPulse:  [79-101] 84Resp:  [16-19] 19BP: (117-175)/(68-77) 117/74SpO2:  [94 %-100 %] 95 %Device (Oxygen Therapy): room airI/O's:I/O last 3 completed shifts:In: - Out: 600 [Urine:600]Physical Exam:Gen: NAD, A&Ox4 CV: Regular rate, hemodynamically stablePulm: Normal work of breathing on room air Abd: soft,minimally distended, non tender Ext: WWP Labs:Recent Labs Lab 05/16/241024 05/16/241035 05/16/241716 05/17/240602 WBC 16.2*  --  14.6* 11.9* HGB 14.5  --  12.9 12.3 HCT 44.20 45.0 40.00 39.00 PLT 442*  --  408 391 Recent Labs Lab 05/16/241024 05/16/241035 05/16/241716 05/16/241745 05/17/240602 05/17/241159 05/17/241758 05/18/240008 05/18/240111 NA 133*  --  137  --  137  --   --   --   --  K 5.1 4.8 4.7  --  4.3  --   --   --   --  CL 96*  --  102  --  103  --   --   --   --  CO2 24  --  23  --  23  --   --   --   --  BUN 15  --  11  --  10  --   --   --   --  CREATININE 0.80 0.7 0.60  --  0.70  --   --   --   --  GLU 214* 222* 140*   < > 125*   < > 97 79 137* CALCIUM 9.7  --  8.7*  --  8.4*  --   --   --   --  MG 2.1  --  2.0  --  1.9  --   --   --   --  PHOS  --   --  3.2  --  2.5  --   --   --   --   < > = values in this interval not displayed. Recent Labs Lab 05/16/241024 ALT 30 AST 33 ALKPHOS 168* BILITOT 0.3 BILIDIR <0.2 LIPASE 49 Recent Labs Lab 05/13/241338 05/16/241717 05/17/240828 PTT  --  55.6*  --  INR 3.16* 3.16* 2.82* Diagnostics:No results found. ASSESSMENT:Maria [name] is an 72 y.o. 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 [place] shows a recurrent SBO with transition point in the lower abdomen. PLAN:Home meds as appropriate NPO/IVF Pain and nausea control PRN Monitor abdominal exam Awaiting return of bowel functionSigned: Alajiah Dutkiewicz Edukugho5/18/2024 4:59 AM

## 2022-09-14 ENCOUNTER — Encounter: Admit: 2022-09-14 | Payer: PRIVATE HEALTH INSURANCE | Primary: Internal Medicine

## 2022-09-14 ENCOUNTER — Ambulatory Visit: Admit: 2022-09-14 | Payer: PRIVATE HEALTH INSURANCE | Primary: Internal Medicine

## 2022-09-14 DIAGNOSIS — L719 Rosacea, unspecified: Secondary | ICD-10-CM

## 2022-09-14 DIAGNOSIS — K559 Vascular disorder of intestine, unspecified: Secondary | ICD-10-CM

## 2022-09-14 DIAGNOSIS — F32A Depression: Secondary | ICD-10-CM

## 2022-09-14 DIAGNOSIS — D6862 Lupus anticoagulant syndrome: Secondary | ICD-10-CM

## 2022-09-14 DIAGNOSIS — R42 Dizziness and giddiness: Secondary | ICD-10-CM

## 2022-09-14 DIAGNOSIS — K55039 Acute (reversible) ischemia of large intestine, extent unspecified: Secondary | ICD-10-CM

## 2022-09-14 DIAGNOSIS — K436 Other and unspecified ventral hernia with obstruction, without gangrene: Secondary | ICD-10-CM

## 2022-09-14 DIAGNOSIS — E7800 Pure hypercholesterolemia, unspecified: Secondary | ICD-10-CM

## 2022-09-14 DIAGNOSIS — E119 Type 2 diabetes mellitus without complications: Secondary | ICD-10-CM

## 2022-09-14 NOTE — Progress Notes
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  The patient was contacted regarding lab drawn INR results.  Patient's INR findings and prescribed dose from last visit:  07/24/2022   3:38 PM 09/08/2022   9:50 AM 09/14/2022  12:05 PM Anticoagulation Monitoring Assoc. INR Date 07/24/2022 09/07/2022 09/13/2022 Associated INR 2.77 3.16 1.37 Instructions 7.5 mg every Tue, Thu; 5 mg all other days 7.5 mg every Tue, Thu; 5 mg all other days 5/20: 7.5 mg; 5/22: 7.5 mg; Otherwise 7.5 mg every Tue, Thu; 5 mg all other days Sunday Dose 5 mg 5 mg - Monday Dose 5 mg - 7.5 mg (5/20) Tuesday Dose 7.5 mg 7.5 mg 7.5 mg Wednesday Dose 5 mg 5 mg 7.5 mg (5/22) Thursday Dose 7.5 mg 7.5 mg 7.5 mg Friday Dose 5 mg 5 mg - Saturday Dose 5 mg 5 mg - Last 7 day dose total 40 mg 40 mg 40 mg Next INR Date 08/21/2022 09/14/2022 09/18/2022 The following findings were reported from today's call (no findings if none selected):[x]  Missed doses []  Extra doses [x]  Change in medications []  Change in vitamin k rich food intake (green vegetables, herbals, etc) []  Change in alcohol use []  Change in overall health [x]  Recent hospitalization / emergency department visit []  Upcoming procedure (including dental procedures) []  Other Comments:        The following side effects were reported from today's call (no side effects if none selected):[]  Bleeding []  New bruising []  Warfarin-emergency department visit or hospital admission []  Other Comments:   No side effects Assessment/Plan:Patient's INR is 1.38, which is below goal range of 2.0-3.0.  Patient was admitted to rule out small bowel obstruction therefore warfarin was held and Lovenox given. Dr. Daphine Deutscher would like dose escalation without Lovenox bridging.  She was instructed to  take warfarin 7.5 mg daily  as indicated in the maintenance plan below.  Next INR assessment due  5/24 . 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  Electronically Signed by [name], RPH, Sep 14, 2022 [facility] [facility]  [address] [place], [place] [address]  Phone: [phone]  Fax: [phone]

## 2022-09-15 ENCOUNTER — Encounter: Admit: 2022-09-15 | Payer: PRIVATE HEALTH INSURANCE | Primary: Internal Medicine

## 2022-09-16 NOTE — Progress Notes
[facility] Care Navigation:  Transition of Care Note Discharging Hospital: 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  Her most recent admission for SBO was in November of 2022 and was managed conservatively.  She reported that this episode began the day prior to admission at midnight when she started having diffuse, cramping abdominal pain.  That morning she started developing nausea.  She vomited 3 times on the day of admission.  Diagnosis at discharge: Recurrent small bowel obstructionTransitional Care Management Services Outreach Call:Two unsuccessful call attempts made by RN Care Coordinator. No further follow up calls to be made at this time.

## 2022-09-18 ENCOUNTER — Ambulatory Visit: Admit: 2022-09-18 | Payer: PRIVATE HEALTH INSURANCE | Attending: Hematology & Oncology | Primary: Internal Medicine

## 2022-09-18 ENCOUNTER — Encounter: Admit: 2022-09-18 | Payer: PRIVATE HEALTH INSURANCE | Primary: Internal Medicine

## 2022-09-18 ENCOUNTER — Encounter: Admit: 2022-09-18 | Payer: PRIVATE HEALTH INSURANCE | Attending: Pharmacotherapy | Primary: Internal Medicine

## 2022-09-18 ENCOUNTER — Ambulatory Visit: Admit: 2022-09-18 | Payer: PRIVATE HEALTH INSURANCE | Attending: Pharmacotherapy | Primary: Internal Medicine

## 2022-09-18 ENCOUNTER — Inpatient Hospital Stay: Admit: 2022-09-18 | Discharge: 2022-09-18 | Payer: PRIVATE HEALTH INSURANCE | Primary: Internal Medicine

## 2022-09-18 DIAGNOSIS — R42 Dizziness and giddiness: Secondary | ICD-10-CM

## 2022-09-18 DIAGNOSIS — K436 Other and unspecified ventral hernia with obstruction, without gangrene: Secondary | ICD-10-CM

## 2022-09-18 DIAGNOSIS — Z5181 Encounter for therapeutic drug level monitoring: Secondary | ICD-10-CM

## 2022-09-18 DIAGNOSIS — K559 Vascular disorder of intestine, unspecified: Secondary | ICD-10-CM

## 2022-09-18 DIAGNOSIS — L719 Rosacea, unspecified: Secondary | ICD-10-CM

## 2022-09-18 DIAGNOSIS — D6862 Lupus anticoagulant syndrome: Secondary | ICD-10-CM

## 2022-09-18 DIAGNOSIS — F32A Depression: Secondary | ICD-10-CM

## 2022-09-18 DIAGNOSIS — Z7901 Long term (current) use of anticoagulants: Secondary | ICD-10-CM

## 2022-09-18 DIAGNOSIS — E119 Type 2 diabetes mellitus without complications: Secondary | ICD-10-CM

## 2022-09-18 DIAGNOSIS — E7800 Pure hypercholesterolemia, unspecified: Secondary | ICD-10-CM

## 2022-09-18 DIAGNOSIS — K55039 Acute (reversible) ischemia of large intestine, extent unspecified: Secondary | ICD-10-CM

## 2022-09-18 LAB — PROTIME AND INR
BKR INR: 2.61 mg/dL — ABNORMAL HIGH (ref 0.86–1.12)
BKR PROTHROMBIN TIME: 27.2 seconds — ABNORMAL HIGH (ref 9.6–12.3)

## 2022-09-18 NOTE — Progress Notes
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  The patient was contacted regarding lab drawn INR results.  Patient's INR findings and prescribed dose from last visit:  09/08/2022   9:50 AM 09/14/2022  12:05 PM 09/18/2022   5:21 PM Anticoagulation Monitoring Assoc. INR Date 09/07/2022 09/13/2022 09/18/2022 Associated INR 3.16 1.37 2.61 Instructions 7.5 mg every Tue, Thu; 5 mg all other days 5/20: 7.5 mg; 5/22: 7.5 mg; Otherwise 7.5 mg every Tue, Thu; 5 mg all other days 7.5 mg every Tue, Thu; 5 mg all other days Sunday Dose 5 mg - 5 mg Monday Dose - 7.5 mg (5/20) 5 mg Tuesday Dose 7.5 mg 7.5 mg 7.5 mg Wednesday Dose 5 mg 7.5 mg (5/22) 5 mg Thursday Dose 7.5 mg 7.5 mg 7.5 mg Friday Dose 5 mg - 5 mg Saturday Dose 5 mg - 5 mg Last 7 day dose total 40 mg 40 mg 45 mg Next INR Date 09/14/2022 09/18/2022 09/25/2022 The following findings were reported from today's call (no findings if none selected):[]  Missed doses []  Extra doses []  Change in medications []  Change in vitamin k rich food intake (green vegetables, herbals, etc) []  Change in alcohol use []  Change in overall health []  Recent hospitalization / emergency department visit []  Upcoming procedure (including dental procedures) []  Other Comments:       Unable to assess.  The following side effects were reported from today's call (no side effects if none selected):[]  Bleeding []  New bruising []  Warfarin-emergency department visit or hospital admission []  Other Comments:   Unable to assess.  Assessment/Plan:Patient's INR is 2.61, which is within goal range of 2.0-3.0.  Patient's chart was reviewed and voice message was left instructing patient to call the clinic if there have been any changes to prescription or over the counter medications or diet, if experiencing a current illness, if there are any planned upcoming invasive/dental procedures or any other changes that could affect warfarin therapy. She was instructed to continue current weekly dose as indicated in the maintenance plan below.  Next INR assessment due 1 week.  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  Electronically Signed by Markus Daft, [facility], Sep 18, 2022 [facility] Anticoagulation [facility]  [address] [place], [place] [address]  Phone: [phone]  Fax: [phone]

## 2022-09-23 NOTE — Other
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  Her most recent admission for SBO was in November of 2022 and was managed conservatively.  She reports that this episode began last evening at midnight when she started having diffuse, cramping abdominal pain.  This morning at about 4:00 a.m. she started developing nausea.  She has vomited 3 times since this morning, with the most recent episode being at 11:00 a.m..  Her last bowel movement was yesterday at 7:00 p.m..  On presentation she was afebrile and hemodynamically stable.  Her abdomen is distended with mild diffuse tenderness to palpation.  Labs demonstrate a WBC of 16.2.  [place] shows a recurrent small-bowel obstruction with a transition point in the lower abdomen.PMH:She  has a past medical history of Acute ischemic colitis (HC Code) (HC CODE) (HC Code) (2015), Depression, Diabetes mellitus (HC Code), Hypercholesteremia, Lupus anticoagulant disorder (HC Code), Rosacea, Ventral hernia with bowel obstruction, and Vertigo.PSH:She  has a past surgical history that includes Knee ligament reconstruction (Right, 2004); Laparoscopic Nissen fundoplication; Umbilical hernia repair; Ventral hernia repair; Tubal ligation; THYROID BIOPSY; Colostomy (05/27/13); Sinus surgery; Abdominal adhesion surgery (2016); Blepharoplasty (Bilateral, 05/2016); Colostomy closure; and Hysteroscopy w/ polypectomy (06/18/2020).MEDICATIONS: Prior to Admission medications  Medication Sig Start Date End Date Taking? Authorizing Provider acetaminophen (TYLENOL) 500 mg tablet Take 1 tablet (500 mg total) by mouth every 4 (four) hours as needed. 01/13/21   Mujadzic, Reuel Derby, MD amoxicillin-clavulanate (AUGMENTIN) 875-125 mg per tablet Take 1 tablet by mouth every 12 (twelve) hours for 10 days. 09/01/22 09/11/22  Sherron Monday, PA biotin 1 mg tablet Take 1 tablet (1,000 mcg total) by mouth daily.    Provider, Historical cholecalciferol, vitamin D3, 125 mcg (5,000 unit) tablet Take 1 tablet (5,000 Units total) by mouth every other day.    Provider, Historical diphenhydramine HCl (BENADRYL ALLERGY ORAL) Take 1 tablet by mouth as needed.    Provider, Historical glipiZIDE (GLUCOTROL) 5 mg Immediate Release tablet Take 1 tablet (5 mg total) by mouth daily before breakfast. 11/28/21 05/26/22  Aranzullo, Elyse Hsu, APRN JARDIANCE 25 mg tablet 1 TABLET BY MOUTH ONCE DAILY IN THE MORNING FOR 30 DAYS 01/15/20   Provider, Historical meclizine (ANTIVERT) 12.5 mg tablet Take 1 tablet (12.5 mg total) by mouth daily as needed for Dizziness 07/12/17   Jacqualyn Posey, MD melatonin 10 mg Tab Take 10 mg by mouth nightly as needed.     Provider, Historical multivitamin tablet Take 1 tablet by mouth daily.    Provider, Historical olopatadine (PATADAY) 0.2 % ophthalmic solution Place 1 drop into both eyes daily. 05/26/21   Warren Danes, MD ondansetron (ZOFRAN) 4 MG tablet Take 1 tablet (4 mg total) by mouth every 8 (eight) hours as needed for nausea.    Provider, Historical phytonadione, vit K1, (VITAMIN K) 100 mcg tablet Take 1 tablet (100 mcg total) by mouth daily.    Provider, Historical polyethylene glycol (MIRALAX) 17 gram packet Take 1 packet (17 g total) by mouth daily. Mix  in 8 ounces of water, juice, soda, coffee or tea prior to taking. 07/18/18   Peter Garter, MD pravastatin (PRAVACHOL) 40 MG tablet Take 1.5 tablets (60 mg total) by mouth nightly.    Provider, Historical senna (SENOKOT) 8.6 mg tablet Take 2 tablets by mouth nightly.. 08/11/16   Lupe Carney, APRN sitaGLIPtin (JANUVIA) 100 MG tablet Take 1 tablet (100 mg total) by mouth daily.    Provider, Historical venlafaxine (EFFEXOR XR) 150 mg XR 24 hr extended release capsule Take 1 capsule (150 mg total) by mouth daily. 06/30/16   Provider, Historical warfarin (COUMADIN) 5 mg tablet Take 2.5 mg on Tuesdays and Thursdays and 5 mg all other days of the week or as directed by warfarin clinic 02/10/22   Harlow Mares, MD  ALLERGIES: She  is allergic to morphine; biaxin  [clarithromycin]; and latex, natural rubber.OBJECTIVEVitals:Temp:  [97.5 ?F (36.4 ?C)-97.8 ?F (36.6 ?C)] 97.8 ?F (36.6 ?C)Pulse:  [90-91] 91Resp:  [16-20] 16BP: (142-150)/(63-76) 148/72SpO2:  [98 %-100 %] 100 %Device (Oxygen Therapy): room airI/O's:No intake/output data recorded.Physical Exam:Gen: NAD, A&Ox4 CV: Regular rate, hemodynamically stablePulm: Normal work of breathing on room air Abd: soft, distended, mild diffuse tenderness palpation, no rebound or guardingExt: [facility] Labs:Recent Labs Lab 05/09/241512 05/16/241024 05/16/241035 WBC 13.1* 16.2*  --  HGB 11.2* 14.5  --  HCT 34.90* 44.20 45.0 PLT 193 442*  --  Recent Labs Lab 05/16/241024 05/16/241035 NA 133*  --  K 5.1 4.8 CL 96*  --  CO2 24  --  BUN 15  --  CREATININE 0.80 0.7 GLU 214* 222* CALCIUM 9.7  --  MG 2.1  --  Recent Labs Lab 05/16/241024 ALT 30 AST 33 ALKPHOS 168* BILITOT 0.3 BILIDIR <0.2 LIPASE 49 Recent Labs Lab 05/13/241338 INR 3.16* Diagnostics:[place] Abdomen Pelvis with IV Contrast without oral (BMI>25)Result Date: 5/16/2024Recurrent small bowel obstruction with zone of transition in the lower abdomen, likely in the setting of post hernia repair adhesions. Findings discussed by phone with Dr. Ferdinand Cava on 09/10/2022 12:37 PM. [name] Notify System Classification: Routine. Report initiated by:  Moses Manners, MD Reported and signed by: Virl Axe, MD  [facility] Radiology and Biomedical Imaging XR Chest PA or APResult Date: 09/10/2022 No acute cardiothoracic abnormality. [place] [facility] Notify System Classification: Routine. Report initiated by:  Moses Manners, MD Reported and signed by: Bradly Bienenstock, MD  [facility] Radiology and Biomedical Imaging   ASSESSMENT:Maria [name] is an 72 y.o. 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 [place] shows a recurrent SBO with transition point in the lower abdomen. PLAN:Admit to surgery NPO/IVF Home meds as appropriate Pain and nausea control PRN Signed: Joline Salt, MD5/16/2024 1:10 PMBest Contact: [facility] Attg:Pt seen and examined, as above.72 y.o. female with extensive surgical history and SBO, tender, on AC.  Will monitor carefully as pt may progress to needing exlap this evenning.Plan as above.[place] Jack Woodward, MD5/16/2024

## 2022-09-25 ENCOUNTER — Encounter: Admit: 2022-09-25 | Payer: PRIVATE HEALTH INSURANCE | Primary: Internal Medicine

## 2022-09-25 DIAGNOSIS — Z5181 Encounter for therapeutic drug level monitoring: Secondary | ICD-10-CM

## 2022-10-02 ENCOUNTER — Telehealth: Admit: 2022-10-02 | Payer: PRIVATE HEALTH INSURANCE | Primary: Internal Medicine

## 2022-10-02 ENCOUNTER — Encounter: Admit: 2022-10-02 | Payer: PRIVATE HEALTH INSURANCE | Primary: Internal Medicine

## 2022-10-02 DIAGNOSIS — Z5181 Encounter for therapeutic drug level monitoring: Secondary | ICD-10-CM

## 2022-10-02 NOTE — Telephone Encounter
Hi team, patient called stating she would like a call back to schedule colonoscopy appointment. LOV with Dr. Justus Memory 12/2018, patrient has a recall for 11/2023.  Please call back at [phone] to assist. Thank you

## 2022-10-03 ENCOUNTER — Encounter: Admit: 2022-10-03 | Payer: PRIVATE HEALTH INSURANCE | Primary: Internal Medicine

## 2022-10-03 DIAGNOSIS — Z1211 Encounter for screening for malignant neoplasm of colon: Secondary | ICD-10-CM

## 2022-10-09 ENCOUNTER — Encounter: Admit: 2022-10-09 | Payer: PRIVATE HEALTH INSURANCE | Primary: Internal Medicine

## 2022-10-09 DIAGNOSIS — Z5181 Encounter for therapeutic drug level monitoring: Secondary | ICD-10-CM

## 2022-10-13 ENCOUNTER — Encounter: Admit: 2022-10-13 | Payer: PRIVATE HEALTH INSURANCE | Attending: Ophthalmology | Primary: Internal Medicine

## 2022-10-13 ENCOUNTER — Telehealth: Admit: 2022-10-13 | Payer: PRIVATE HEALTH INSURANCE | Attending: Pharmacotherapy | Primary: Internal Medicine

## 2022-10-13 NOTE — Unmapped
I spoke with [name] on 10/13/2022 at 1:46 PM with a reminder that she is due for an INR test based on the referral for anticoagulation monitoring. This is the first week of INR reminder calls.  She will go to the lab tomorrow, 10/14/22 to recheck her INR. Markus Daft, RPHAmbulatory Care AssociateSmilow Trumbull Care Center6/18/20241:46 PM

## 2022-10-14 ENCOUNTER — Telehealth: Admit: 2022-10-14 | Payer: PRIVATE HEALTH INSURANCE | Attending: Hematology & Oncology | Primary: Internal Medicine

## 2022-10-14 ENCOUNTER — Ambulatory Visit: Admit: 2022-10-14 | Payer: PRIVATE HEALTH INSURANCE | Attending: Hematology & Oncology | Primary: Internal Medicine

## 2022-10-14 ENCOUNTER — Inpatient Hospital Stay: Admit: 2022-10-14 | Discharge: 2022-10-14 | Payer: PRIVATE HEALTH INSURANCE | Primary: Internal Medicine

## 2022-10-14 ENCOUNTER — Encounter: Admit: 2022-10-14 | Payer: PRIVATE HEALTH INSURANCE | Primary: Internal Medicine

## 2022-10-14 ENCOUNTER — Ambulatory Visit: Admit: 2022-10-14 | Payer: PRIVATE HEALTH INSURANCE | Primary: Internal Medicine

## 2022-10-14 DIAGNOSIS — D6862 Lupus anticoagulant syndrome: Secondary | ICD-10-CM

## 2022-10-14 DIAGNOSIS — R42 Dizziness and giddiness: Secondary | ICD-10-CM

## 2022-10-14 DIAGNOSIS — Z7901 Long term (current) use of anticoagulants: Secondary | ICD-10-CM

## 2022-10-14 DIAGNOSIS — E119 Type 2 diabetes mellitus without complications: Secondary | ICD-10-CM

## 2022-10-14 DIAGNOSIS — Z5181 Encounter for therapeutic drug level monitoring: Secondary | ICD-10-CM

## 2022-10-14 DIAGNOSIS — L719 Rosacea, unspecified: Secondary | ICD-10-CM

## 2022-10-14 DIAGNOSIS — K55039 Acute (reversible) ischemia of large intestine, extent unspecified: Secondary | ICD-10-CM

## 2022-10-14 DIAGNOSIS — F32A Depression: Secondary | ICD-10-CM

## 2022-10-14 DIAGNOSIS — E7800 Pure hypercholesterolemia, unspecified: Secondary | ICD-10-CM

## 2022-10-14 DIAGNOSIS — K436 Other and unspecified ventral hernia with obstruction, without gangrene: Secondary | ICD-10-CM

## 2022-10-14 DIAGNOSIS — K559 Vascular disorder of intestine, unspecified: Secondary | ICD-10-CM

## 2022-10-14 LAB — PROTIME AND INR
BKR INR: 8.14 — CR (ref 0.86–1.12)
BKR PROTHROMBIN TIME: 78.8 seconds — ABNORMAL HIGH (ref 9.6–12.3)

## 2022-10-14 NOTE — Unmapped
Calling with critical PT/INR lab results. Specimen # [id number].

## 2022-10-14 NOTE — Unmapped
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  The patient was contacted regarding lab drawn INR results.  Patient's INR findings and prescribed dose from last visit:  09/14/2022  12:05 PM 09/18/2022   5:21 PM 10/14/2022   4:00 PM Anticoagulation Monitoring Assoc. INR Date 09/13/2022 09/18/2022 10/14/2022 Associated INR 1.37 2.61 8.14 Instructions 5/20: 7.5 mg; 5/22: 7.5 mg; Otherwise 7.5 mg every Tue, Thu; 5 mg all other days 7.5 mg every Tue, Thu; 5 mg all other days 6/19: Hold; 6/20: Hold; Otherwise 7.5 mg every Tue, Thu; 5 mg all other days Sunday Dose - 5 mg - Monday Dose 7.5 mg (5/20) 5 mg - Tuesday Dose 7.5 mg 7.5 mg - Wednesday Dose 7.5 mg (5/22) 5 mg Hold (6/19) Thursday Dose 7.5 mg 7.5 mg Hold (6/20) Friday Dose - 5 mg - Saturday Dose - 5 mg - Last 7 day dose total 40 mg 45 mg 40 mg Next INR Date 09/18/2022 09/25/2022 10/16/2022 The following findings were reported from today's call (no findings if none selected):[]  Missed doses []  Extra doses [x]  Change in medications []  Change in vitamin k rich food intake (green vegetables, herbals, etc) []  Change in alcohol use []  Change in overall health []  Recent hospitalization / emergency department visit []  Upcoming procedure (including dental procedures) []  Other Comments:       Patient ran out of vitamin K a week ago and started quetiapine The following side effects were reported from today's call (no side effects if none selected):[]  Bleeding [x]  New bruising []  Warfarin-emergency department visit or hospital admission []  Other Comments:   Patient has bruising Assessment/Plan:Patient's INR is 8.1, which is above goal range of 2.0-3.0.  Patient ran out of vitamin K a week ago and started quetiapine. She has some bruising but no other signs or symptoms of bleeding. Patient will go to the ER if she experiences any new signs.  She was instructed to hold 2 dose(s) and restart Vitamin K 100 mcg daily  as indicated in the maintenance plan below.  Next INR assessment due  10/16/22 . 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  Electronically Signed by [name], RPH, October 14, 2022 [facility] Anticoagulation Clinic  [address] [place], [place] [address]  Phone: [phone]  Fax: [phone]

## 2022-10-14 NOTE — Unmapped
Carlie from [facility] calling back, needs to verbally confirm critical result. Call transferred to PN

## 2022-10-14 NOTE — Unmapped
Critical Lab Results Notification Reason for call: Chief Complaint Patient presents with  Critical Value Alert   INR 8.14  Name of notified Provider: Donnella Sham, MD, [name], RPHDate/Time Provider notified:10/14/2022  Time: 16:38 Name of Critical Lab Test/Result: INR 8.14

## 2022-10-16 ENCOUNTER — Inpatient Hospital Stay: Admit: 2022-10-16 | Discharge: 2022-10-16 | Payer: PRIVATE HEALTH INSURANCE | Primary: Internal Medicine

## 2022-10-16 ENCOUNTER — Ambulatory Visit: Admit: 2022-10-16 | Payer: PRIVATE HEALTH INSURANCE | Attending: Hematology & Oncology | Primary: Internal Medicine

## 2022-10-16 ENCOUNTER — Encounter: Admit: 2022-10-16 | Payer: PRIVATE HEALTH INSURANCE | Primary: Internal Medicine

## 2022-10-16 DIAGNOSIS — Z7901 Long term (current) use of anticoagulants: Secondary | ICD-10-CM

## 2022-10-16 DIAGNOSIS — Z5181 Encounter for therapeutic drug level monitoring: Secondary | ICD-10-CM

## 2022-10-16 LAB — PROTIME AND INR
BKR INR: 3.15 — ABNORMAL HIGH (ref 0.86–1.12)
BKR PROTHROMBIN TIME: 32.4 seconds — ABNORMAL HIGH (ref 9.6–12.3)

## 2022-10-19 ENCOUNTER — Encounter: Admit: 2022-10-19 | Payer: PRIVATE HEALTH INSURANCE | Primary: Internal Medicine

## 2022-10-19 ENCOUNTER — Ambulatory Visit: Admit: 2022-10-19 | Payer: PRIVATE HEALTH INSURANCE | Primary: Internal Medicine

## 2022-10-19 DIAGNOSIS — D6862 Lupus anticoagulant syndrome: Secondary | ICD-10-CM

## 2022-10-19 DIAGNOSIS — L719 Rosacea, unspecified: Secondary | ICD-10-CM

## 2022-10-19 DIAGNOSIS — R42 Dizziness and giddiness: Secondary | ICD-10-CM

## 2022-10-19 DIAGNOSIS — F32A Depression: Secondary | ICD-10-CM

## 2022-10-19 DIAGNOSIS — K436 Other and unspecified ventral hernia with obstruction, without gangrene: Secondary | ICD-10-CM

## 2022-10-19 DIAGNOSIS — E7800 Pure hypercholesterolemia, unspecified: Secondary | ICD-10-CM

## 2022-10-19 DIAGNOSIS — K55039 Acute (reversible) ischemia of large intestine, extent unspecified: Secondary | ICD-10-CM

## 2022-10-19 DIAGNOSIS — K559 Vascular disorder of intestine, unspecified: Secondary | ICD-10-CM

## 2022-10-19 DIAGNOSIS — E119 Type 2 diabetes mellitus without complications: Secondary | ICD-10-CM

## 2022-10-19 NOTE — Unmapped
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  The patient was contacted regarding lab drawn INR results.  Patient's INR findings and prescribed dose from last visit:  09/18/2022   5:21 PM 10/14/2022   4:00 PM 10/19/2022   9:39 AM Anticoagulation Monitoring Assoc. INR Date 09/18/2022 10/14/2022 10/16/2022 Associated INR 2.61 8.14 3.15 Instructions 7.5 mg every Tue, Thu; 5 mg all other days 6/19: Hold; 6/20: Hold; Otherwise 7.5 mg every Tue, Thu; 5 mg all other days 7.5 mg every Tue, Thu; 5 mg all other days Sunday Dose 5 mg - - Monday Dose 5 mg - 5 mg Tuesday Dose 7.5 mg - 7.5 mg Wednesday Dose 5 mg Hold (6/19) 5 mg Thursday Dose 7.5 mg Hold (6/20) 7.5 mg Friday Dose 5 mg - - Saturday Dose 5 mg - - Last 7 day dose total 45 mg 40 mg 27.5 mg Next INR Date 09/25/2022 10/16/2022 10/23/2022 Findings from today's call:Unable to assess, left voice message. Side effects reported:Unable to assess, left voice message.  Assessment/Plan:Patient's INR is 3.15, which is above goal range of 2.0-3.0.  Patient's chart was reviewed and voice message was left instructing patient to call the clinic if there have been any changes to prescription or over the counter medications or diet, if experiencing a current illness, if there are any planned upcoming invasive/dental procedures or any other changes that could affect warfarin therapy.  She was instructed to continue current weekly dose and take Vitamin K 100 mcg daily  as indicated in the maintenance plan below.  Next INR assessment due 1 week.  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  Electronically Signed by Maria [name], RPH, October 19, 2022 [facility] Anticoagulation Clinic  [address] [place], [place] [address]  Phone: [phone]  Fax: [phone]

## 2022-10-23 ENCOUNTER — Ambulatory Visit: Admit: 2022-10-23 | Payer: PRIVATE HEALTH INSURANCE | Attending: Hematology & Oncology | Primary: Internal Medicine

## 2022-10-23 ENCOUNTER — Encounter: Admit: 2022-10-23 | Payer: PRIVATE HEALTH INSURANCE | Primary: Internal Medicine

## 2022-10-23 ENCOUNTER — Inpatient Hospital Stay: Admit: 2022-10-23 | Discharge: 2022-10-23 | Payer: PRIVATE HEALTH INSURANCE | Primary: Internal Medicine

## 2022-10-23 ENCOUNTER — Ambulatory Visit: Admit: 2022-10-23 | Payer: PRIVATE HEALTH INSURANCE | Primary: Internal Medicine

## 2022-10-23 DIAGNOSIS — Z5181 Encounter for therapeutic drug level monitoring: Secondary | ICD-10-CM

## 2022-10-23 DIAGNOSIS — R42 Dizziness and giddiness: Secondary | ICD-10-CM

## 2022-10-23 DIAGNOSIS — Z7901 Long term (current) use of anticoagulants: Secondary | ICD-10-CM

## 2022-10-23 DIAGNOSIS — K559 Vascular disorder of intestine, unspecified: Secondary | ICD-10-CM

## 2022-10-23 DIAGNOSIS — D6862 Lupus anticoagulant syndrome: Secondary | ICD-10-CM

## 2022-10-23 DIAGNOSIS — F32A Depression: Secondary | ICD-10-CM

## 2022-10-23 DIAGNOSIS — E119 Type 2 diabetes mellitus without complications: Secondary | ICD-10-CM

## 2022-10-23 DIAGNOSIS — L719 Rosacea, unspecified: Secondary | ICD-10-CM

## 2022-10-23 DIAGNOSIS — K436 Other and unspecified ventral hernia with obstruction, without gangrene: Secondary | ICD-10-CM

## 2022-10-23 DIAGNOSIS — K55039 Acute (reversible) ischemia of large intestine, extent unspecified: Secondary | ICD-10-CM

## 2022-10-23 DIAGNOSIS — E7800 Pure hypercholesterolemia, unspecified: Secondary | ICD-10-CM

## 2022-10-23 LAB — PROTIME AND INR
BKR INR: 2.69 (ref 0.86–1.12)
BKR PROTHROMBIN TIME: 28 seconds — ABNORMAL HIGH (ref 9.6–12.3)

## 2022-10-23 NOTE — Unmapped
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  The patient was contacted regarding lab drawn INR results.  Patient's INR findings and prescribed dose from last visit:  10/14/2022   4:00 PM 10/19/2022   9:39 AM 10/23/2022   3:45 PM Anticoagulation Monitoring Assoc. INR Date 10/14/2022 10/16/2022 10/23/2022 Associated INR 8.14 3.15 2.69 Instructions 6/19: Hold; 6/20: Hold; Otherwise 7.5 mg every Tue, Thu; 5 mg all other days 7.5 mg every Tue, Thu; 5 mg all other days 7.5 mg every Tue, Thu; 5 mg all other days Sunday Dose - - 5 mg Monday Dose - 5 mg 5 mg Tuesday Dose - 7.5 mg 7.5 mg Wednesday Dose Hold (6/19) 5 mg 5 mg Thursday Dose Hold (6/20) 7.5 mg 7.5 mg Friday Dose - - 5 mg Saturday Dose - - 5 mg Last 7 day dose total 40 mg 27.5 mg 40 mg Next INR Date 10/16/2022 10/23/2022 11/06/2022 The following findings were reported from today's call (no findings if none selected):[]  Missed doses []  Extra doses []  Change in medications []  Change in vitamin k rich food intake (green vegetables, herbals, etc) []  Change in alcohol use []  Change in overall health []  Recent hospitalization / emergency department visit []  Upcoming procedure (including dental procedures) []  Other Comments:       No findings The following side effects were reported from today's call (no side effects if none selected):[]  Bleeding []  New bruising []  Warfarin-emergency department visit or hospital admission []  Other Comments:   No side effects Assessment/Plan:Patient's INR is 2.69, which is within goal range of 2.0-3.0.  She was instructed to continue current weekly dose as indicated in the maintenance plan below.  Next INR assessment due 2 weeks.  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  Electronically Signed by [name], RPH, October 23, 2022 [facility] Anticoagulation [facility]  [address] [place], [place] [address]  Phone: [phone]  Fax: [phone]

## 2022-10-30 ENCOUNTER — Encounter: Admit: 2022-10-30 | Payer: PRIVATE HEALTH INSURANCE | Primary: Internal Medicine

## 2022-10-30 ENCOUNTER — Encounter: Admit: 2022-10-30 | Payer: PRIVATE HEALTH INSURANCE | Attending: Ophthalmology | Primary: Internal Medicine

## 2022-10-30 ENCOUNTER — Ambulatory Visit: Admit: 2022-10-30 | Payer: PRIVATE HEALTH INSURANCE | Attending: Ophthalmology | Primary: Internal Medicine

## 2022-10-30 DIAGNOSIS — H04123 Dry eye syndrome of bilateral lacrimal glands: Secondary | ICD-10-CM

## 2022-10-30 DIAGNOSIS — K436 Other and unspecified ventral hernia with obstruction, without gangrene: Secondary | ICD-10-CM

## 2022-10-30 DIAGNOSIS — H1013 Acute atopic conjunctivitis, bilateral: Secondary | ICD-10-CM

## 2022-10-30 DIAGNOSIS — Z5181 Encounter for therapeutic drug level monitoring: Secondary | ICD-10-CM

## 2022-10-30 DIAGNOSIS — L719 Rosacea, unspecified: Secondary | ICD-10-CM

## 2022-10-30 DIAGNOSIS — F32A Depression: Secondary | ICD-10-CM

## 2022-10-30 DIAGNOSIS — H25813 Combined forms of age-related cataract, bilateral: Secondary | ICD-10-CM

## 2022-10-30 DIAGNOSIS — E119 Type 2 diabetes mellitus without complications: Secondary | ICD-10-CM

## 2022-10-30 DIAGNOSIS — D6862 Lupus anticoagulant syndrome: Secondary | ICD-10-CM

## 2022-10-30 DIAGNOSIS — K55039 Acute (reversible) ischemia of large intestine, extent unspecified: Secondary | ICD-10-CM

## 2022-10-30 DIAGNOSIS — R42 Dizziness and giddiness: Secondary | ICD-10-CM

## 2022-10-30 DIAGNOSIS — E7800 Pure hypercholesterolemia, unspecified: Secondary | ICD-10-CM

## 2022-10-30 NOTE — Patient Instructions
BOTH EYESTry Refresh, Systane, or Blink artificial tears 2-3x/day as neededPataday drops once dailyOcusoft lid scrubsClean your eyelids 1-2x/day (spray Ocusoft Hypochlor solution on an eye makeup removal pad and clean the eyelash margins with your eyes closed)

## 2022-10-30 NOTE — Progress Notes
Allergic conjunctivitis ou -  on Pataday, discussed using pataday consistently and artificial tears PRN. Don't use Visine. 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 I discussed the case with the resident and agree with the findings and plan as documented in the resident's note.  I personally examined the patient and formulated the plan. I personally discussed with patient and answered all questions.Diabetes mellitus no retinopathyJessica Keylee Shrestha, MDYale Eye Center7/08/2022 at 9:51 AM

## 2022-11-04 ENCOUNTER — Encounter: Admit: 2022-11-04 | Payer: PRIVATE HEALTH INSURANCE | Primary: Internal Medicine

## 2022-11-04 ENCOUNTER — Inpatient Hospital Stay: Admit: 2022-11-04 | Discharge: 2022-11-04 | Payer: PRIVATE HEALTH INSURANCE | Primary: Internal Medicine

## 2022-11-04 DIAGNOSIS — E119 Type 2 diabetes mellitus without complications: Secondary | ICD-10-CM

## 2022-11-04 DIAGNOSIS — E7800 Pure hypercholesterolemia, unspecified: Secondary | ICD-10-CM

## 2022-11-04 DIAGNOSIS — F32A Depression: Secondary | ICD-10-CM

## 2022-11-04 DIAGNOSIS — L719 Rosacea, unspecified: Secondary | ICD-10-CM

## 2022-11-04 DIAGNOSIS — R42 Dizziness and giddiness: Secondary | ICD-10-CM

## 2022-11-04 DIAGNOSIS — K436 Other and unspecified ventral hernia with obstruction, without gangrene: Secondary | ICD-10-CM

## 2022-11-04 DIAGNOSIS — D6862 Lupus anticoagulant syndrome: Secondary | ICD-10-CM

## 2022-11-04 DIAGNOSIS — Z1231 Encounter for screening mammogram for malignant neoplasm of breast: Secondary | ICD-10-CM

## 2022-11-04 DIAGNOSIS — K55039 Acute (reversible) ischemia of large intestine, extent unspecified: Secondary | ICD-10-CM

## 2022-11-04 DIAGNOSIS — Z1239 Encounter for other screening for malignant neoplasm of breast: Secondary | ICD-10-CM

## 2022-11-06 ENCOUNTER — Encounter: Admit: 2022-11-06 | Payer: PRIVATE HEALTH INSURANCE | Primary: Internal Medicine

## 2022-11-06 DIAGNOSIS — Z5181 Encounter for therapeutic drug level monitoring: Secondary | ICD-10-CM

## 2022-11-09 ENCOUNTER — Inpatient Hospital Stay: Admit: 2022-11-09 | Discharge: 2022-11-09 | Payer: PRIVATE HEALTH INSURANCE | Primary: Internal Medicine

## 2022-11-09 DIAGNOSIS — Z7901 Long term (current) use of anticoagulants: Secondary | ICD-10-CM

## 2022-11-09 DIAGNOSIS — Z5181 Encounter for therapeutic drug level monitoring: Secondary | ICD-10-CM

## 2022-11-09 LAB — PROTIME AND INR
BKR INR: 4.5 — ABNORMAL HIGH (ref 0.86–1.12)
BKR PROTHROMBIN TIME: 45.3 seconds — ABNORMAL HIGH (ref 9.6–12.3)

## 2022-11-10 ENCOUNTER — Encounter: Admit: 2022-11-10 | Payer: PRIVATE HEALTH INSURANCE | Primary: Internal Medicine

## 2022-11-10 ENCOUNTER — Ambulatory Visit: Admit: 2022-11-10 | Payer: PRIVATE HEALTH INSURANCE | Primary: Internal Medicine

## 2022-11-10 DIAGNOSIS — D6862 Lupus anticoagulant syndrome: Secondary | ICD-10-CM

## 2022-11-10 DIAGNOSIS — K436 Other and unspecified ventral hernia with obstruction, without gangrene: Secondary | ICD-10-CM

## 2022-11-10 DIAGNOSIS — K559 Vascular disorder of intestine, unspecified: Secondary | ICD-10-CM

## 2022-11-10 DIAGNOSIS — F32A Depression: Secondary | ICD-10-CM

## 2022-11-10 DIAGNOSIS — K55039 Acute (reversible) ischemia of large intestine, extent unspecified: Secondary | ICD-10-CM

## 2022-11-10 DIAGNOSIS — L719 Rosacea, unspecified: Secondary | ICD-10-CM

## 2022-11-10 DIAGNOSIS — E7800 Pure hypercholesterolemia, unspecified: Secondary | ICD-10-CM

## 2022-11-10 DIAGNOSIS — R42 Dizziness and giddiness: Secondary | ICD-10-CM

## 2022-11-10 DIAGNOSIS — E119 Type 2 diabetes mellitus without complications: Secondary | ICD-10-CM

## 2022-11-10 NOTE — Progress Notes
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  The patient was contacted regarding lab drawn INR results.  Patient's INR findings and prescribed dose from last visit:  10/19/2022   9:39 AM 10/23/2022   3:45 PM 11/10/2022  10:05 AM Anticoagulation Monitoring Assoc. INR Date 10/16/2022 10/23/2022 11/09/2022 Associated INR 3.15 2.69 4.50 Instructions 7.5 mg every Tue, Thu; 5 mg all other days 7.5 mg every Tue, Thu; 5 mg all other days 7/16: Hold; Otherwise 7.5 mg every Tue; 5 mg all other days Sunday Dose - 5 mg 5 mg Monday Dose 5 mg 5 mg - Tuesday Dose 7.5 mg 7.5 mg Hold (7/16) Wednesday Dose 5 mg 5 mg 5 mg Thursday Dose 7.5 mg 7.5 mg 5 mg Friday Dose - 5 mg 5 mg Saturday Dose - 5 mg 5 mg Last 7 day dose total 27.5 mg 40 mg 40 mg Next INR Date 10/23/2022 11/06/2022 11/16/2022 The following findings were reported from today's call (no findings if none selected):[]  Missed doses []  Extra doses [x]  Change in medications []  Change in vitamin k rich food intake (green vegetables, herbals, etc) []  Change in alcohol use []  Change in overall health []  Recent hospitalization / emergency department visit []  Upcoming procedure (including dental procedures) []  Other Comments:       Patient has been taking Tylenol every day for a couple of weeks for headache The following side effects were reported from today's call (no side effects if none selected):[]  Bleeding [x]  New bruising []  Warfarin-emergency department visit or hospital admission []  Other Comments:   Patient reports new bruising Assessment/Plan:Patient's INR is 4.5, which is above goal range of 2.0-3.0.  Patient has been taking Tylenol every day for headaches and has new bruising. Patient states no other signs or symptoms of bleeding and will go to the ER if she experiences any. Patient will increase her weekly spinach by 2 servings and continue her vitamin K tablets.  She was instructed to decrease weekly dose by 6% and hold 1 dose(s) as indicated in the maintenance plan below.  Next INR assessment due 1 week. This information was routed to the covering provider for review.Anticoagulation Summary  As of 11/10/2022  INR goal:  2.0-3.0 TTR:  48.3 % (8.2 y) INR used for dosing:  4.50 (11/09/2022) Warfarin maintenance plan:  7.5 mg (5 mg x 1.5) every Tue; 5 mg (5 mg x 1) all other days Weekly warfarin total:  37.5 mg Plan last modified:  [name], [facility] (11/10/2022) Next INR check:  11/16/2022 Priority:  High Priority Target end date:  Indefinite  Indications  Lupus anticoagulant with hypercoagulable state (HC Code) [D68.62]Ischemic colitis (HC Code) [K55.9]   Anticoagulation Episode Summary   INR check location:    Preferred lab:    Send INR reminders to:  [facility] [place] INR POOL  Comments:    Anticoagulation Care Providers   Provider Role Specialty Phone number  [name], MD Responsible Medical Oncology [phone]  [name], [place] Responsible Medical Oncology [phone]  All patient's medications have been reviewed and updated as needed.  Electronically Signed by [name], RPH, November 10, 2022 [facility] Anticoagulation Clinic  [address] [place], [place] [address]  Phone: [phone]  Fax: [phone]

## 2022-11-20 ENCOUNTER — Telehealth: Admit: 2022-11-20 | Payer: PRIVATE HEALTH INSURANCE | Attending: Pharmacotherapy | Primary: Internal Medicine

## 2022-11-20 ENCOUNTER — Encounter: Admit: 2022-11-20 | Payer: PRIVATE HEALTH INSURANCE | Primary: Internal Medicine

## 2022-11-20 DIAGNOSIS — Z5181 Encounter for therapeutic drug level monitoring: Secondary | ICD-10-CM

## 2022-11-20 NOTE — Telephone Encounter
LVM asking patient to have an INR re-checked as soon as possible. The INR was due on 11/16/22.  She had a supratherapeutic INR (4.5) on 11/09/22 and warfarin dose adjustments were recommended.

## 2022-11-24 ENCOUNTER — Ambulatory Visit: Admit: 2022-11-24 | Payer: PRIVATE HEALTH INSURANCE | Attending: Oncology | Primary: Internal Medicine

## 2022-11-24 ENCOUNTER — Telehealth: Admit: 2022-11-24 | Payer: PRIVATE HEALTH INSURANCE | Attending: Oncology | Primary: Internal Medicine

## 2022-11-24 ENCOUNTER — Telehealth: Admit: 2022-11-24 | Payer: PRIVATE HEALTH INSURANCE | Attending: Hematology & Oncology | Primary: Internal Medicine

## 2022-11-24 ENCOUNTER — Ambulatory Visit: Admit: 2022-11-24 | Payer: PRIVATE HEALTH INSURANCE | Primary: Internal Medicine

## 2022-11-24 DIAGNOSIS — D6862 Lupus anticoagulant syndrome: Secondary | ICD-10-CM

## 2022-11-24 NOTE — Telephone Encounter
Called patient regarding missed appointment, she answered & said she thought the appointment was tomorrow & with Dr Daphine Deutscher?  She said she needs another appointment soon because she needs medical clearance to have her ENT surgery.  I explained we would have to call her back due to providers busy schedue

## 2022-11-24 NOTE — Telephone Encounter
Pt called requesting for a letter to be sent to Dr. Carma Leaven from [facility] so that she can be cleared for right knee surgery. Fax number to [facility] is [phone] Attn Alina.

## 2022-11-25 ENCOUNTER — Encounter: Admit: 2022-11-25 | Payer: PRIVATE HEALTH INSURANCE | Primary: Internal Medicine

## 2022-11-25 ENCOUNTER — Encounter
Admit: 2022-11-25 | Payer: PRIVATE HEALTH INSURANCE | Attending: Student in an Organized Health Care Education/Training Program | Primary: Internal Medicine

## 2022-11-25 ENCOUNTER — Ambulatory Visit: Admit: 2022-11-25 | Payer: PRIVATE HEALTH INSURANCE | Attending: Hematology & Oncology | Primary: Internal Medicine

## 2022-11-25 ENCOUNTER — Ambulatory Visit: Admit: 2022-11-25 | Payer: PRIVATE HEALTH INSURANCE | Primary: Internal Medicine

## 2022-11-25 ENCOUNTER — Inpatient Hospital Stay: Admit: 2022-11-25 | Discharge: 2022-11-25 | Payer: PRIVATE HEALTH INSURANCE | Primary: Internal Medicine

## 2022-11-25 DIAGNOSIS — E7800 Pure hypercholesterolemia, unspecified: Secondary | ICD-10-CM

## 2022-11-25 DIAGNOSIS — E119 Type 2 diabetes mellitus without complications: Secondary | ICD-10-CM

## 2022-11-25 DIAGNOSIS — D6862 Lupus anticoagulant syndrome: Secondary | ICD-10-CM

## 2022-11-25 DIAGNOSIS — Z5181 Encounter for therapeutic drug level monitoring: Secondary | ICD-10-CM

## 2022-11-25 DIAGNOSIS — K559 Vascular disorder of intestine, unspecified: Secondary | ICD-10-CM

## 2022-11-25 DIAGNOSIS — Z7901 Long term (current) use of anticoagulants: Secondary | ICD-10-CM

## 2022-11-25 DIAGNOSIS — K436 Other and unspecified ventral hernia with obstruction, without gangrene: Secondary | ICD-10-CM

## 2022-11-25 DIAGNOSIS — K55039 Acute (reversible) ischemia of large intestine, extent unspecified: Secondary | ICD-10-CM

## 2022-11-25 DIAGNOSIS — F32A Depression: Secondary | ICD-10-CM

## 2022-11-25 DIAGNOSIS — R42 Dizziness and giddiness: Secondary | ICD-10-CM

## 2022-11-25 DIAGNOSIS — L719 Rosacea, unspecified: Secondary | ICD-10-CM

## 2022-11-25 LAB — CBC WITH AUTO DIFFERENTIAL
BKR WAM ABSOLUTE IMMATURE GRANULOCYTES.: 0.02 x 1000/ÂµL (ref 0.00–0.30)
BKR WAM ABSOLUTE LYMPHOCYTE COUNT.: 1.5 x 1000/ÂµL (ref 0.60–3.70)
BKR WAM ABSOLUTE NRBC (2 DEC): 0 x 1000/ÂµL (ref 0.00–1.00)
BKR WAM ANC (ABSOLUTE NEUTROPHIL COUNT): 4.99 x 1000/ÂµL (ref 2.00–7.60)
BKR WAM BASOPHIL ABSOLUTE COUNT.: 0.04 x 1000/ÂµL — AB (ref 0.00–1.00)
BKR WAM BASOPHILS: 0.6 % (ref 0–3)
BKR WAM EOSINOPHIL ABSOLUTE COUNT.: 0.16 x 1000/ÂµL (ref 0.00–1.00)
BKR WAM EOSINOPHILS: 2.2 % (ref 0–5)
BKR WAM HEMATOCRIT (2 DEC): 39.6 % (ref 35.00–45.00)
BKR WAM HEMOGLOBIN: 12.2 g/dL (ref 11.7–15.5)
BKR WAM IMMATURE GRANULOCYTES: 0.3 % (ref 0.0–1.0)
BKR WAM LYMPHOCYTES: 20.8 % (ref 17.0–50.0)
BKR WAM MCH (PG): 26.3 pg — ABNORMAL LOW (ref 27.0–33.0)
BKR WAM MCHC: 30.8 g/dL — ABNORMAL LOW (ref 31.0–36.0)
BKR WAM MCV: 85.3 fL (ref 80.0–100.0)
BKR WAM MONOCYTE ABSOLUTE COUNT.: 0.49 x 1000/ÂµL (ref 0.00–1.00)
BKR WAM MONOCYTES: 6.8 % — AB (ref 4.0–12.0)
BKR WAM MPV: 11.6 fL (ref 8.0–12.0)
BKR WAM NEUTROPHILS: 69.3 % (ref 39.0–72.0)
BKR WAM NUCLEATED RED BLOOD CELLS: 0 % (ref 0.0–1.0)
BKR WAM PLATELETS: 326 x1000/ÂµL (ref 150–420)
BKR WAM RDW-CV: 14.9 % (ref 11.0–15.0)
BKR WAM RED BLOOD CELL COUNT.: 4.64 M/ÂµL (ref 4.00–6.00)
BKR WAM WHITE BLOOD CELL COUNT: 7.2 x1000/ÂµL (ref 4.0–11.0)

## 2022-11-25 LAB — COMPREHENSIVE METABOLIC PANEL
BKR A/G RATIO: 1.1 (ref 1.0–2.2)
BKR ALANINE AMINOTRANSFERASE (ALT): 13 U/L (ref 10–35)
BKR ALBUMIN: 3.7 g/dL (ref 3.6–5.1)
BKR ALKALINE PHOSPHATASE: 100 U/L (ref 9–122)
BKR ANION GAP: 10 (ref 7–17)
BKR ASPARTATE AMINOTRANSFERASE (AST): 20 U/L (ref 10–35)
BKR AST/ALT RATIO: 1.5
BKR BILIRUBIN TOTAL: <0.2 mg/dL (ref <=1.2)
BKR BLOOD UREA NITROGEN: 19 mg/dL (ref 8–23)
BKR BUN / CREAT RATIO: 21.6 (ref 8.0–23.0)
BKR CALCIUM: 9.4 mg/dL (ref 8.8–10.2)
BKR CHLORIDE: 102 mmol/L (ref 98–107)
BKR CO2: 27 mmol/L (ref 20–30)
BKR CREATININE: 0.88 mg/dL (ref 0.40–1.30)
BKR EGFR, CREATININE (CKD-EPI 2021): >60 mL/min/{1.73_m2} (ref >=60)
BKR GLOBULIN: 3.4 g/dL (ref 2.0–3.9)
BKR GLUCOSE: 153 mg/dL — ABNORMAL HIGH (ref 70–100)
BKR POTASSIUM: 4.5 mmol/L (ref 3.3–5.3)
BKR PROTEIN TOTAL: 7.1 g/dL (ref 5.9–8.3)
BKR SODIUM: 139 mmol/L (ref 136–144)

## 2022-11-25 LAB — PROTIME AND INR
BKR INR: 4.42 — ABNORMAL HIGH (ref 0.86–1.12)
BKR PROTHROMBIN TIME: 44.5 seconds — ABNORMAL HIGH (ref 9.6–12.3)

## 2022-11-25 NOTE — Progress Notes
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  The patient was contacted regarding lab drawn INR results.  Patient's INR findings and prescribed dose from last visit:  10/23/2022   3:45 PM 11/10/2022  10:05 AM 11/25/2022   4:37 PM [facility] Monitoring [facility]oc[facility] INR Date 10/23/2022 11/09/2022 11/25/2022 Associated INR 2.69 4.50 4.42 Instructions 7.5 mg every Tue, Thu; 5 mg all other days 7/16: Hold; Otherwise 7.5 mg every Tue; 5 mg all other days 7/31: Hold; Otherwise 7.5 mg every Tue; 5 mg all other days Sunday Dose 5 mg 5 mg 5 mg Monday Dose 5 mg - 5 mg Tuesday Dose 7.5 mg Hold (7/16) 7.5 mg Wednesday Dose 5 mg 5 mg Hold (7/31) Thursday Dose 7.5 mg 5 mg 5 mg Friday Dose 5 mg 5 mg 5 mg Saturday Dose 5 mg 5 mg 5 mg Last 7 day dose total 40 mg 40 mg 37.5 mg Next INR Date 11/06/2022 11/16/2022  The following findings were reported from today's call (no findings if none selected):[]  Missed doses []  Extra doses [x]  Change in medications []  Change in vitamin k rich food intake (green vegetables, herbals, etc) []  Change in alcohol use []  Change in overall health []  Recent hospitalization / emergency department visit [x]  Upcoming procedure (including dental procedures) []  Other Comments:       Patient took Motrin for knee pain, knee surgery is planned for 8/12 pending approval The following side effects were reported from today's call (no side effects if none selected):[]  Bleeding []  New bruising []  Warfarin-emergency department visit or hospital admission []  Other Comments:   No side effects Assessment/Plan:Patient's INR is 4.42, which is above goal range of 2.0-3.0.  Patient took Motrin for knee pain, knee surgery is planned for 8/12 pending approval. Patient was re-educated about the dangers of taking Motrin with warfarin and will only take Tylenol going forward. Patient states no signs or symptoms of bleeding.  She was instructed to continue current weekly dose and hold 1 dose(s) as indicated in the maintenance plan below.  Next INR assessment due  8/6 . 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  Electronically Signed by [name], RPH, November 25, 2022 [facility] Anticoagulation Clinic  [address] [place], [place] [address]  Phone: [phone]  Fax: [phone]

## 2022-11-27 ENCOUNTER — Telehealth: Admit: 2022-11-27 | Payer: PRIVATE HEALTH INSURANCE | Attending: Hematology & Oncology | Primary: Internal Medicine

## 2022-11-27 ENCOUNTER — Encounter: Admit: 2022-11-27 | Payer: PRIVATE HEALTH INSURANCE | Primary: Internal Medicine

## 2022-11-27 DIAGNOSIS — Z5181 Encounter for therapeutic drug level monitoring: Secondary | ICD-10-CM

## 2022-11-30 ENCOUNTER — Telehealth: Admit: 2022-11-30 | Payer: PRIVATE HEALTH INSURANCE | Attending: Internal Medicine | Primary: Internal Medicine

## 2022-11-30 NOTE — Telephone Encounter
I called this patient to reschedule her appt but she is having knee surgery on 8/12 and she really wanted to see the podiatrist before but she will have to call back to reschedule.  Patient verbally understood that the tomorrow appt is cancelled.

## 2022-12-01 ENCOUNTER — Ambulatory Visit: Admit: 2022-12-01 | Payer: PRIVATE HEALTH INSURANCE | Primary: Internal Medicine

## 2022-12-01 ENCOUNTER — Inpatient Hospital Stay: Admit: 2022-12-01 | Discharge: 2022-12-01 | Payer: PRIVATE HEALTH INSURANCE | Primary: Internal Medicine

## 2022-12-01 ENCOUNTER — Ambulatory Visit: Admit: 2022-12-01 | Payer: PRIVATE HEALTH INSURANCE | Attending: Hematology & Oncology | Primary: Internal Medicine

## 2022-12-01 DIAGNOSIS — Z7901 Long term (current) use of anticoagulants: Secondary | ICD-10-CM

## 2022-12-01 DIAGNOSIS — Z5181 Encounter for therapeutic drug level monitoring: Secondary | ICD-10-CM

## 2022-12-01 LAB — PROTIME AND INR
BKR INR: 3.05 mmol/L — ABNORMAL HIGH (ref 0.86–1.12)
BKR PROTHROMBIN TIME: 31.5 seconds — ABNORMAL HIGH (ref 9.6–12.3)

## 2022-12-01 NOTE — Telephone Encounter
Call to let pt know that Dr. Daphine Deutscher was made aware of pt's upcoming knee surgery 12/07/2022. Per Dr. Daphine Deutscher pt would bridge with Lovenox. I let [name] RPh know this and she stated she would write directives for the pt. Pt had her PT/INR drawn today. Once the bridging directives are written I will fax to Dr. Lafonda Mosses.  Fax number to [facility] is [phone] Attn Alina. She tells me that she has seen her PCP for the surgical clearance as well.

## 2022-12-02 ENCOUNTER — Encounter: Admit: 2022-12-02 | Payer: PRIVATE HEALTH INSURANCE | Primary: Internal Medicine

## 2022-12-02 ENCOUNTER — Telehealth: Admit: 2022-12-02 | Payer: PRIVATE HEALTH INSURANCE | Attending: Hematology & Oncology | Primary: Internal Medicine

## 2022-12-02 ENCOUNTER — Ambulatory Visit: Admit: 2022-12-02 | Payer: PRIVATE HEALTH INSURANCE | Primary: Internal Medicine

## 2022-12-02 DIAGNOSIS — E7800 Pure hypercholesterolemia, unspecified: Secondary | ICD-10-CM

## 2022-12-02 DIAGNOSIS — L719 Rosacea, unspecified: Secondary | ICD-10-CM

## 2022-12-02 DIAGNOSIS — K55039 Acute (reversible) ischemia of large intestine, extent unspecified: Secondary | ICD-10-CM

## 2022-12-02 DIAGNOSIS — K559 Vascular disorder of intestine, unspecified: Secondary | ICD-10-CM

## 2022-12-02 DIAGNOSIS — E119 Type 2 diabetes mellitus without complications: Secondary | ICD-10-CM

## 2022-12-02 DIAGNOSIS — F32A Depression: Secondary | ICD-10-CM

## 2022-12-02 DIAGNOSIS — D6862 Lupus anticoagulant syndrome: Secondary | ICD-10-CM

## 2022-12-02 DIAGNOSIS — K436 Other and unspecified ventral hernia with obstruction, without gangrene: Secondary | ICD-10-CM

## 2022-12-02 DIAGNOSIS — R42 Dizziness and giddiness: Secondary | ICD-10-CM

## 2022-12-02 MED ORDER — ENOXAPARIN 100 MG/ML SUBCUTANEOUS SYRINGE
100 | Freq: Every day | SUBCUTANEOUS | 2 refills | Status: AC
Start: 2022-12-02 — End: ?

## 2022-12-02 NOTE — Telephone Encounter
Patient requests call back from nursing in regard to previous message about surgical clearance. Patient expressed anxiety as she is scheduled for surgey on Monday, but they will cancel the appointment if they do not have clearance

## 2022-12-02 NOTE — Telephone Encounter
Pt was calling regarding clearence letter being sent to [facility] @203 [phone] she having knee surgery Monday.

## 2022-12-02 NOTE — Telephone Encounter
Spoke with pt and reassured her we will send clearance to Dr Lafonda Mosses tomorrow am.Pt has been unable to get into her mychart, and thus to read bridging instructions. She took her last coumadin dose last night, and will start lovenox injections tomorrow.Bridging instructions emailed to pt.No further questions at this time.

## 2022-12-02 NOTE — Progress Notes
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  The patient was contacted regarding lab drawn INR results.  Patient's INR findings and prescribed dose from last visit:  11/10/2022  10:05 AM 11/25/2022   4:37 PM 12/02/2022   9:49 AM Anticoagulation Monitoring Assoc. INR Date 11/09/2022 11/25/2022 12/01/2022 Associated INR 4.50 4.42 3.05 Instructions 7/16: Hold; Otherwise 7.5 mg every Tue; 5 mg all other days 7/31: Hold; Otherwise 7.5 mg every Tue; 5 mg all other days 8/7: Hold; 8/8: Hold; 8/9: Hold; 8/10: Hold; 8/11: Hold; 8/12: 7.5 mg; 8/14: 7.5 mg; 8/15: 7.5 mg; Otherwise 7.5 mg every Tue; 5 mg all other days Sunday Dose 5 mg 5 mg Hold (8/11) Monday Dose - 5 mg 7.5 mg (8/12) Tuesday Dose Hold (7/16) - 7.5 mg Wednesday Dose 5 mg Hold (7/31) Hold (8/7), 7.5 mg (8/14) Thursday Dose 5 mg 5 mg Hold (8/8), 7.5 mg (8/15) Friday Dose 5 mg 5 mg Hold (8/9) Saturday Dose 5 mg 5 mg Hold (8/10) Last 7 day dose total 40 mg 37.5 mg 32.5 mg Next INR Date 11/16/2022 12/01/2022 12/11/2022 The following findings were reported from today's call (no findings if none selected):[]  Missed doses []  Extra doses []  Change in medications []  Change in vitamin k rich food intake (green vegetables, herbals, etc) []  Change in alcohol use []  Change in overall health []  Recent hospitalization / emergency department visit [x]  Upcoming procedure (including dental procedures) []  Other Comments:       Patient is having shoulder surgery 8/12 The following side effects were reported from today's call (no side effects if none selected):[]  Bleeding []  New bruising []  Warfarin-emergency department visit or hospital admission []  Other Comments:   No side effects Assessment/Plan:Patient's INR is 3.05, which is within goal range of 2.0-3.0.  Patient is having shoulder surgery 8/12 and bridging plan was sent.  She was instructed to continue current weekly dose and follow bridging plan  as indicated in the maintenance plan below.  Next INR assessment due  12/11/22 .  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  Electronically Signed by [name], RPH, December 02, 2022 [facility] Anticoagulation Clinic  [address] [place], [place] [address]  Phone: [phone]  Fax: [phone]

## 2022-12-03 NOTE — Telephone Encounter
Left a VM with Alina at Dr. Judith Blonder office and asked for a return call to let me know if the bridging directives I faxed this am are adequate to proceed with surgery? [facility] number left with Alina.

## 2022-12-04 ENCOUNTER — Encounter: Admit: 2022-12-04 | Payer: PRIVATE HEALTH INSURANCE | Primary: Internal Medicine

## 2022-12-04 DIAGNOSIS — Z5181 Encounter for therapeutic drug level monitoring: Secondary | ICD-10-CM

## 2022-12-07 ENCOUNTER — Ambulatory Visit: Admit: 2022-12-07 | Payer: PRIVATE HEALTH INSURANCE | Primary: Internal Medicine

## 2022-12-07 ENCOUNTER — Telehealth: Admit: 2022-12-07 | Payer: PRIVATE HEALTH INSURANCE | Primary: Internal Medicine

## 2022-12-07 ENCOUNTER — Encounter: Admit: 2022-12-07 | Payer: PRIVATE HEALTH INSURANCE | Primary: Internal Medicine

## 2022-12-07 ENCOUNTER — Telehealth: Admit: 2022-12-07 | Payer: PRIVATE HEALTH INSURANCE | Attending: Hematology & Oncology | Primary: Internal Medicine

## 2022-12-07 DIAGNOSIS — R42 Dizziness and giddiness: Secondary | ICD-10-CM

## 2022-12-07 DIAGNOSIS — L719 Rosacea, unspecified: Secondary | ICD-10-CM

## 2022-12-07 DIAGNOSIS — K436 Other and unspecified ventral hernia with obstruction, without gangrene: Secondary | ICD-10-CM

## 2022-12-07 DIAGNOSIS — K55039 Acute (reversible) ischemia of large intestine, extent unspecified: Secondary | ICD-10-CM

## 2022-12-07 DIAGNOSIS — D6862 Lupus anticoagulant syndrome: Secondary | ICD-10-CM

## 2022-12-07 DIAGNOSIS — K559 Vascular disorder of intestine, unspecified: Secondary | ICD-10-CM

## 2022-12-07 DIAGNOSIS — F32A Depression: Secondary | ICD-10-CM

## 2022-12-07 DIAGNOSIS — E119 Type 2 diabetes mellitus without complications: Secondary | ICD-10-CM

## 2022-12-07 DIAGNOSIS — E7800 Pure hypercholesterolemia, unspecified: Secondary | ICD-10-CM

## 2022-12-07 NOTE — Telephone Encounter
Received a call from  Ortho this am asking about the patient's bridging instructions and last INR. I reported the INR from 12/01/22. Let her know that pt was to take last dose of coumadin on 12/01/2022 and none beginning 12/02/22. Lovenox was to begin 12/03/2022 no warfarin. Per anesthetist pt misunderstood the directions and took Lovenox an extra two days . She states that pt's surgery will be cancelled today and pt will need new bridging instructions for a new surgical date. Rescheduled for 12/14/2022. Not to [name] [facility] re: need for new instructions.

## 2022-12-07 NOTE — Telephone Encounter
Patient returning call back. Best Contact: [phone]

## 2022-12-08 ENCOUNTER — Encounter: Admit: 2022-12-08 | Payer: PRIVATE HEALTH INSURANCE | Primary: Internal Medicine

## 2022-12-08 ENCOUNTER — Ambulatory Visit: Admit: 2022-12-08 | Payer: PRIVATE HEALTH INSURANCE | Primary: Internal Medicine

## 2022-12-08 DIAGNOSIS — L719 Rosacea, unspecified: Secondary | ICD-10-CM

## 2022-12-08 DIAGNOSIS — F32A Depression: Secondary | ICD-10-CM

## 2022-12-08 DIAGNOSIS — E7800 Pure hypercholesterolemia, unspecified: Secondary | ICD-10-CM

## 2022-12-08 DIAGNOSIS — D6862 Lupus anticoagulant syndrome: Secondary | ICD-10-CM

## 2022-12-08 DIAGNOSIS — K559 Vascular disorder of intestine, unspecified: Secondary | ICD-10-CM

## 2022-12-08 DIAGNOSIS — R42 Dizziness and giddiness: Secondary | ICD-10-CM

## 2022-12-08 DIAGNOSIS — K55039 Acute (reversible) ischemia of large intestine, extent unspecified: Secondary | ICD-10-CM

## 2022-12-08 DIAGNOSIS — E119 Type 2 diabetes mellitus without complications: Secondary | ICD-10-CM

## 2022-12-08 DIAGNOSIS — K436 Other and unspecified ventral hernia with obstruction, without gangrene: Secondary | ICD-10-CM

## 2022-12-08 MED ORDER — ENOXAPARIN 100 MG/ML SUBCUTANEOUS SYRINGE
100 | Freq: Every day | SUBCUTANEOUS | 2 refills | Status: AC
Start: 2022-12-08 — End: ?

## 2022-12-09 NOTE — Progress Notes
Patient did not hold warfarin for 5 days prior to 8/12 shoulder surgery therefore it was postponed to 8/19. Dr. Daphine Deutscher would like the patient to continue Lovenox injections and restart warfarin after the surgery if approved by her surgeon. The following bridging plan was discussed with the patient and her husband [place]:Before procedure: - 8/13: Inject enoxaparin 100 mg under the skin once daily. Do not take any warfarin.- 8/14: Inject enoxaparin 100 mg under the skin once daily. Do not take any warfarin.- 8/15: Inject enoxaparin 100 mg under the skin once daily. Do not take any warfarin.- 8/16: Inject enoxaparin 100 mg under the skin once daily. Do not take any warfarin.- 8/17: Inject enoxaparin 100 mg under the skin once daily. Do not take any warfarin.- 8/18: Inject enoxaparin 100 mg under the skin once daily. Do not take any warfarin. Day of procedure:- 8/19: Do not inject any enoxaparin before your procedure. After the procedure, if approved by your surgeon, take warfarin 7.5 mg at night .  After the procedure:- 8/20: Inject enoxaparin 100 mg under the skin once daily. Take warfarin 7.5 mg at night. - 8/21: Inject enoxaparin 100 mg under the skin once daily. Take warfarin 7.5 mg at night. - 8/22: Inject enoxaparin 100 mg under the skin once daily. Take warfarin 7.5 mg at night. - 8/23: Retest your INR in the morning, warfarin clinic will call with further dosing instructions. Please call the clinic if you do not hear from [place] by the end of the day. Patient verbalized understanding of the plan, a new script for Lovenox 100 mg syringes was sent and Mychart message sent.[name], RPHClinical Pharmacist, Anticoagulation ClinicSmilow [facility]

## 2022-12-10 ENCOUNTER — Telehealth: Admit: 2022-12-10 | Payer: PRIVATE HEALTH INSURANCE | Attending: Oncology | Primary: Internal Medicine

## 2022-12-10 ENCOUNTER — Ambulatory Visit: Admit: 2022-12-10 | Payer: PRIVATE HEALTH INSURANCE | Primary: Internal Medicine

## 2022-12-10 ENCOUNTER — Ambulatory Visit: Admit: 2022-12-10 | Payer: PRIVATE HEALTH INSURANCE | Attending: Hematology & Oncology | Primary: Internal Medicine

## 2022-12-10 NOTE — Telephone Encounter
New lovenox/bridging instructions faxed to Dr. Lafonda Mosses this am. Fax # [phone]

## 2022-12-10 NOTE — Telephone Encounter
Recd' call from patient she would like to discuss her 08/19 right knee surgery warfarin (COUMADIN) 5 mg tablet and enoxaparin (LOVENOX) 100 mg/mL syringe medication instructions with Carolynn before the surgery. Best number to discuss with the patient is [phone]

## 2022-12-11 ENCOUNTER — Encounter: Admit: 2022-12-11 | Payer: PRIVATE HEALTH INSURANCE | Primary: Internal Medicine

## 2022-12-11 DIAGNOSIS — Z5181 Encounter for therapeutic drug level monitoring: Secondary | ICD-10-CM

## 2022-12-14 ENCOUNTER — Telehealth: Admit: 2022-12-14 | Payer: PRIVATE HEALTH INSURANCE | Attending: Hematology & Oncology | Primary: Internal Medicine

## 2022-12-14 NOTE — Telephone Encounter
Maria [name] called the office and states  that she is double checking on her medications on how to take it tonight and tomorrow and  the rest of the week....warfarin (COUMADIN) 5 mg tablet  and enoxaparin (LOVENOX) 100 mg/mL syringe Patient is requesting a [phone]

## 2022-12-14 NOTE — Telephone Encounter
Returned a call to [place]. I have gone through the instructions from [name] [facility] with [place].Per Carolyn's note pt is to take 7.5 mg Warfarin tonight and now Lovenox. Per [place] the surgeon has Dorette Grate that she restart her blood thinners. She understands she will then take Lovenox 100 mg and coumadin 7.5 mg daily until the 23rd and she will recheck her INR for further directives.

## 2022-12-18 ENCOUNTER — Encounter: Admit: 2022-12-18 | Payer: PRIVATE HEALTH INSURANCE | Primary: Internal Medicine

## 2022-12-18 ENCOUNTER — Ambulatory Visit: Admit: 2022-12-18 | Payer: PRIVATE HEALTH INSURANCE | Primary: Internal Medicine

## 2022-12-18 ENCOUNTER — Inpatient Hospital Stay: Admit: 2022-12-18 | Discharge: 2022-12-18 | Payer: PRIVATE HEALTH INSURANCE | Primary: Internal Medicine

## 2022-12-18 DIAGNOSIS — D6862 Lupus anticoagulant syndrome: Secondary | ICD-10-CM

## 2022-12-18 DIAGNOSIS — K436 Other and unspecified ventral hernia with obstruction, without gangrene: Secondary | ICD-10-CM

## 2022-12-18 DIAGNOSIS — E7800 Pure hypercholesterolemia, unspecified: Secondary | ICD-10-CM

## 2022-12-18 DIAGNOSIS — L719 Rosacea, unspecified: Secondary | ICD-10-CM

## 2022-12-18 DIAGNOSIS — Z5181 Encounter for therapeutic drug level monitoring: Secondary | ICD-10-CM

## 2022-12-18 DIAGNOSIS — Z7901 Long term (current) use of anticoagulants: Secondary | ICD-10-CM

## 2022-12-18 DIAGNOSIS — R42 Dizziness and giddiness: Secondary | ICD-10-CM

## 2022-12-18 DIAGNOSIS — E119 Type 2 diabetes mellitus without complications: Secondary | ICD-10-CM

## 2022-12-18 DIAGNOSIS — K559 Vascular disorder of intestine, unspecified: Secondary | ICD-10-CM

## 2022-12-18 DIAGNOSIS — K55039 Acute (reversible) ischemia of large intestine, extent unspecified: Secondary | ICD-10-CM

## 2022-12-18 DIAGNOSIS — F32A Depression: Secondary | ICD-10-CM

## 2022-12-18 LAB — PROTIME AND INR
BKR INR: 1.13 — ABNORMAL HIGH (ref 0.86–1.12)
BKR PROTHROMBIN TIME: 12.4 seconds — ABNORMAL HIGH (ref 9.6–12.3)

## 2022-12-18 MED ORDER — ENOXAPARIN 100 MG/ML SUBCUTANEOUS SYRINGE
100 | Freq: Every day | SUBCUTANEOUS | 2 refills | Status: AC
Start: 2022-12-18 — End: ?

## 2022-12-18 NOTE — Progress Notes
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  The patient was contacted regarding lab drawn INR results.  Patient's INR findings and prescribed dose from last visit:  12/07/2022   2:55 PM 12/08/2022   4:08 PM 12/18/2022   6:00 PM Anticoagulation Monitoring Assoc. INR Date   12/18/2022 Associated INR   1.13 Instructions 8/12: Hold; 8/13: Hold; 8/14: Hold; 8/15: Hold; 8/16: Hold; 8/17: Hold; 8/18: Hold; 8/19: 7.5 mg; 8/21: 7.5 mg; 8/22: 7.5 mg; Otherwise 7.5 mg every Tue; 5 mg all other days 8/13: Hold; 8/14: Hold; 8/15: Hold; 8/16: Hold; 8/17: Hold; 8/18: Hold; 8/19: 7.5 mg; 8/21: 7.5 mg; 8/22: 7.5 mg; Otherwise 7.5 mg every Tue; 5 mg all other days 8/23: 7.5 mg; 8/24: 7.5 mg; 8/25: 7.5 mg; Otherwise 7.5 mg every Tue; 5 mg all other days Sunday Dose Hold (8/18) Hold (8/18) 7.5 mg (8/25) Monday Dose Hold (8/12), 7.5 mg (8/19) 7.5 mg (8/19) - Tuesday Dose Hold (8/13); Otherwise 7.5 mg Hold (8/13); Otherwise 7.5 mg - Wednesday Dose Hold (8/14), 7.5 mg (8/21) Hold (8/14), 7.5 mg (8/21) - Thursday Dose Hold (8/15), 7.5 mg (8/22) Hold (8/15), 7.5 mg (8/22) - Friday Dose Hold (8/16) Hold (8/16) 7.5 mg (8/23) Saturday Dose Hold (8/17) Hold (8/17) 7.5 mg (8/24) Last 7 day dose total 22.5 mg 17.5 mg 27.5 mg Next INR Date 12/18/2022 12/18/2022 12/21/2022 The following findings were reported from today's call (no findings if none selected):[]  Missed doses []  Extra doses []  Change in medications []  Change in vitamin k rich food intake (green vegetables, herbals, etc) []  Change in alcohol use []  Change in overall health []  Recent hospitalization / emergency department visit []  Upcoming procedure (including dental procedures) []  Other Comments:       No findings The following side effects were reported from today's call (no side effects if none selected):[]  Bleeding []  New bruising []  Warfarin-emergency department visit or hospital admission []  Other Comments:   No side effects Assessment/Plan:Patient's INR is 1.13, which is below goal range of 2.0-3.0.  Patient is bridging after shoulder surgery.  She was instructed to  take warfarin 7.5 mg daily and Lovenox 100 mg sc daily  as indicated in the maintenance plan below.  Next INR assessment due  12/21/22 .  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  Electronically Signed by [name], RPH, December 18, 2022 [facility] Anticoagulation Clinic  [address],  [place], [place] [address]  Phone: [phone]  Fax: [phone]

## 2022-12-21 ENCOUNTER — Encounter: Admit: 2022-12-21 | Payer: PRIVATE HEALTH INSURANCE | Attending: Pharmacotherapy | Primary: Internal Medicine

## 2022-12-21 ENCOUNTER — Ambulatory Visit: Admit: 2022-12-21 | Payer: PRIVATE HEALTH INSURANCE | Attending: Hematology & Oncology | Primary: Internal Medicine

## 2022-12-21 ENCOUNTER — Inpatient Hospital Stay: Admit: 2022-12-21 | Discharge: 2022-12-21 | Payer: PRIVATE HEALTH INSURANCE | Primary: Internal Medicine

## 2022-12-21 ENCOUNTER — Inpatient Hospital Stay: Admit: 2022-12-21 | Discharge: 2022-12-21 | Payer: PRIVATE HEALTH INSURANCE | Attending: Emergency Medicine

## 2022-12-21 ENCOUNTER — Ambulatory Visit: Admit: 2022-12-21 | Payer: PRIVATE HEALTH INSURANCE | Attending: Pharmacotherapy | Primary: Internal Medicine

## 2022-12-21 DIAGNOSIS — Z5181 Encounter for therapeutic drug level monitoring: Secondary | ICD-10-CM

## 2022-12-21 DIAGNOSIS — D6862 Lupus anticoagulant syndrome: Secondary | ICD-10-CM

## 2022-12-21 DIAGNOSIS — Z885 Allergy status to narcotic agent status: Secondary | ICD-10-CM

## 2022-12-21 DIAGNOSIS — R21 Rash and other nonspecific skin eruption: Secondary | ICD-10-CM

## 2022-12-21 DIAGNOSIS — E7800 Pure hypercholesterolemia, unspecified: Secondary | ICD-10-CM

## 2022-12-21 DIAGNOSIS — F32A Depression: Secondary | ICD-10-CM

## 2022-12-21 DIAGNOSIS — L309 Dermatitis, unspecified: Secondary | ICD-10-CM

## 2022-12-21 DIAGNOSIS — Z881 Allergy status to other antibiotic agents status: Secondary | ICD-10-CM

## 2022-12-21 DIAGNOSIS — K436 Other and unspecified ventral hernia with obstruction, without gangrene: Secondary | ICD-10-CM

## 2022-12-21 DIAGNOSIS — Z7901 Long term (current) use of anticoagulants: Secondary | ICD-10-CM

## 2022-12-21 DIAGNOSIS — R2241 Localized swelling, mass and lump, right lower limb: Secondary | ICD-10-CM

## 2022-12-21 DIAGNOSIS — Z91040 Latex allergy status: Secondary | ICD-10-CM

## 2022-12-21 DIAGNOSIS — K55039 Acute (reversible) ischemia of large intestine, extent unspecified: Secondary | ICD-10-CM

## 2022-12-21 DIAGNOSIS — L719 Rosacea, unspecified: Secondary | ICD-10-CM

## 2022-12-21 DIAGNOSIS — E119 Type 2 diabetes mellitus without complications: Secondary | ICD-10-CM

## 2022-12-21 DIAGNOSIS — R42 Dizziness and giddiness: Secondary | ICD-10-CM

## 2022-12-21 DIAGNOSIS — K559 Vascular disorder of intestine, unspecified: Secondary | ICD-10-CM

## 2022-12-21 LAB — PROTIME AND INR
BKR INR: 2.21 mg/dL — ABNORMAL HIGH (ref 0.86–1.12)
BKR PROTHROMBIN TIME: 23.3 seconds — ABNORMAL HIGH (ref 9.6–12.3)

## 2022-12-21 MED ORDER — EMOLLIENT TOPICAL CREAM
Freq: Two times a day (BID) | TOPICAL | 1 refills | Status: AC
Start: 2022-12-21 — End: ?

## 2022-12-21 MED ORDER — CETIRIZINE 10 MG TABLET
10 | ORAL_TABLET | Freq: Every day | ORAL | 1 refills | Status: AC
Start: 2022-12-21 — End: ?

## 2022-12-21 MED ORDER — TRIAMCINOLONE ACETONIDE 0.1 % TOPICAL CREAM
0.1 | Freq: Two times a day (BID) | TOPICAL | 1 refills | Status: AC
Start: 2022-12-21 — End: ?

## 2022-12-21 MED ORDER — ACETAMINOPHEN 325 MG TABLET
325 | Freq: Once | ORAL | Status: CP
Start: 2022-12-21 — End: ?
  Administered 2022-12-21: 22:00:00 325 mg via ORAL

## 2022-12-21 NOTE — Discharge Instructions
You were seen in the Emergency Department today for rash to knee. You were evaluated and your condition was found to be most likely contact dermatitis. You were given a prescription for corticosteroid cream that was sent to your pharmacy for you to pick up after leaving the Emergency Department. Please use this medication as the instructions provided by the pharmacy outline. If you have pain, you may take tylenol 650mg  every 6 hours as needed for relief. Please follow up with your primary care provider in the next 2-3 days for further evaluation of your concerns. Please follow up with your surgeons office in the next 1-2 days as well. If you develop any new or worsening symptoms, such as fever, knee pain, difficulty walking, knee swelling, spreading rash, loss of feeling in foot, please return to the Emergency Department for further evaluation. Feel better!

## 2022-12-21 NOTE — ED Notes
6:03 PM - Discharge instructions given, pt verbalized understanding of all. Steady gait observed with ambulation. Discharged from department in stable condition with all belongings.

## 2022-12-21 NOTE — ED Triage Note
Provider in Triage Note49 y.o. year old female presents with rash on right knee after knee last Monday at [facility], Dr. Lafonda Mosses. Rash started on Wednesday. Physical Exam: Well appearing, ambulatory to triage without difficulty or assistance. Well demarcated rash on anterior knee consistent with contact dermatitis. Orders placed in triage: noDisposition: ExpressCareDaniel Vining8/26/2024 3:10 PM

## 2022-12-22 NOTE — Progress Notes
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  The patient was contacted regarding lab drawn INR results.  Patient's INR findings and prescribed dose from last visit:  12/08/2022   4:08 PM 12/18/2022   6:00 PM 12/21/2022   5:19 PM Anticoagulation Monitoring Assoc. INR Date  12/18/2022 12/21/2022 Associated INR  1.13 2.21 Instructions 8/13: Hold; 8/14: Hold; 8/15: Hold; 8/16: Hold; 8/17: Hold; 8/18: Hold; 8/19: 7.5 mg; 8/21: 7.5 mg; 8/22: 7.5 mg; Otherwise 7.5 mg every Tue; 5 mg all other days 8/23: 7.5 mg; 8/24: 7.5 mg; 8/25: 7.5 mg; Otherwise 7.5 mg every Tue; 5 mg all other days 7.5 mg every Tue; 5 mg all other days Sunday Dose Hold (8/18) 7.5 mg (8/25) 5 mg Monday Dose 7.5 mg (8/19) - 5 mg Tuesday Dose Hold (8/13); Otherwise 7.5 mg - 7.5 mg Wednesday Dose Hold (8/14), 7.5 mg (8/21) - 5 mg Thursday Dose Hold (8/15), 7.5 mg (8/22) - 5 mg Friday Dose Hold (8/16) 7.5 mg (8/23) 5 mg Saturday Dose Hold (8/17) 7.5 mg (8/24) 5 mg Last 7 day dose total 17.5 mg 27.5 mg 50 mg Next INR Date 12/18/2022 12/21/2022 12/28/2022 The following findings were reported from today's call (no findings if none selected):[]  Missed doses []  Extra doses []  Change in medications []  Change in vitamin k rich food intake (green vegetables, herbals, etc) []  Change in alcohol use []  Change in overall health []  Recent hospitalization / emergency department visit []  Upcoming procedure (including dental procedures) []  Other Comments:       Unable to assess.  Recent ED visit for rash on knee. The following side effects were reported from today's call (no side effects if none selected):[]  Bleeding []  New bruising []  Warfarin-emergency department visit or hospital admission []  Other Comments:   Unable to assess.  Assessment/Plan:Patient's INR is 2.21, which is within goal range of 2.0-3.0.  Patient's chart was reviewed and voice message was left instructing patient to call the clinic if there have been any changes to prescription or over the counter medications or diet, if experiencing a current illness, if there are any planned upcoming invasive/dental procedures or any other changes that could affect warfarin therapy.  She was instructed to  discontinue enoxaparin injections and resume prior dose of warfarin at 5mg  daily except 7.5mg  on Tuesdays  as indicated in the maintenance plan below.  Next INR assessment due 1 week.  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  Electronically Signed by Markus Daft, RPH, December 21, 2022 [facility] Anticoagulation Clinic  [address] [place], [place] [address]  Phone: [phone]  Fax: [phone]

## 2022-12-22 NOTE — ED Provider Notes
Chief Complaint Patient presents with  Post-op Problem   Pt arrives from home c/o R knee swelling and rash starting 2 days after surgery. Rash appears to be where tape/glue was used. Pt is s/p meniscus surgery last week. Pt denies fever/chills. Pt ambulatory with cane. VSS, hx of HTN. HPI/PE:Patient is a 72 year old female with past medical history of lupus, diabetes, high cholesterol presenting to the emergency department with concern for rash to right knee x3 days.  Patient notes receiving minimally invasive knee surgery 2 right knee 3 days ago and states that dressing with tape was placed over the laser insertion sites, after which point she began to notice redness and intense itching to that area.  States there were several blisters that drained a small amount of watery yellow fluid.  Notes the area of skin is very uncomfortable, but denies pain in her knee or difficulty walking.  Denies similar rash in any other region of the body or numbness or tingling into feet.  Denies fever, chills, chest pain, shortness of breath, nausea vomiting diarrhea, abdominal pain, swelling or difficulty moving knee joint, drainage from incision, numbness or tingling in the hands or feet, lightheadedness or dizziness or additional symptoms at this time.MDM:Ddx:  Contact dermatitis irritant versus allergic, clinically consistent with shingles or septic arthritis but considered, doubt cellulitis or postoperative infectionSummary:Patient is a 72 year old female with past medical history of lupus, diabetes presenting to the ED with concern for pruritic rug shoe anterior right knee x3 days after minimally invasive right knee meniscal repair.  Rash and exact distribution of tape application for wound dressing after surgery.  Patient ambulatory with good range of motion and no knee pain.  Patient hypertensive but vital signs otherwise stable and patient afebrile.  Rash consistent with contact dermatitis noted to anterior right knee without active fluid drainage, most likely attributable to postoperative wound dressing placed in that exact area.  Given no joint swelling or erythema, as well as lack of joint pain, fevers, impaired range of motion do not suspect septic arthritis or postoperative infection at this time At this time, patient is safe for discharge with triamcinolone animal and and plan for outpatient follow up. Given strict return precautions. Patient verbalized understanding, is amenable with plan, all questions answered.An acute or life threatening problem was considered during this evaluation  A decision regarding hospitalization was made during this visit  Patient does not require admission or further ED Observation at this time  External data reviewed: Notes (OSH or non-ED)  Physical ExamED Triage Vitals [12/21/22 1513]BP: (!) 179/76Pulse: 88Pulse from  O2 sat: n/aResp: 16Temp: 98.3 ?F (36.8 ?C)Temp src: n/aSpO2: 98 % BP (!) 179/76  - Pulse 88  - Temp 98.3 ?F (36.8 ?C)  - Resp 16  - SpO2 98% Physical ExamConstitutional:     Appearance: Normal appearance. She is not ill-appearing. HENT:    Head: Normocephalic. Eyes:    Pupils: Pupils are equal, round, and reactive to light. Cardiovascular:    Rate and Rhythm: Normal rate and regular rhythm.    Pulses: Normal pulses.    Heart sounds: Normal heart sounds. No murmur heard.Pulmonary:    Effort: Pulmonary effort is normal. No respiratory distress.    Breath sounds: Normal breath sounds. No wheezing or rales. Musculoskeletal:       General: No swelling, tenderness or deformity. Normal range of motion.    Right lower leg: No edema.    Left lower leg: No edema.    Comments: No swelling of knee  joint or diffuse erythema; range of motion and strength of bilateral lower extremity intact and symmetrical2+ PT and DP pulses in right lower extremity Skin:   General: Skin is warm and dry.    Capillary Refill: Capillary refill takes less than 2 seconds.    Coloration: Skin is not jaundiced or pale.    Findings: Rash (Erythematous rash with small blisters with serous fluid in rectangular shape at location where teeth was present over knee with small surrounding petechiae and no active drainage) present. No bruising. Neurological:    General: No focal deficit present.    Mental Status: She is alert and oriented to person, place, and time.    Gait: Gait normal.  ProceduresAttestation/Critical CarePatient Reevaluation: ED Attestation: PA/APRNFace to face evaluation was performed by me in collaboration with the Advanced Practice Provider to assess for significant health threats.Rash on knee following knee surgery last week at [facility]. Rash started 1-2 days after surgery, she thinks it could be from tape. On my exam: overall well appearing. Erythematous rash on anterior right knee with well defined rectangular borders. My differential includes: contact dermatitis. Doubt infection. Idalis Hoelting ViningClinical Impressions as of 12/22/22 0224 Rash Dermatitis   Dr. Barbera Setters available for discussion for this patientED DispositionDischarge Scotty Court, PA08/27/24 Greer Ee, MD08/27/24 [phone]

## 2022-12-25 ENCOUNTER — Encounter: Admit: 2022-12-25 | Payer: PRIVATE HEALTH INSURANCE | Attending: Pharmacotherapy | Primary: Internal Medicine

## 2022-12-25 DIAGNOSIS — D6862 Lupus anticoagulant syndrome: Secondary | ICD-10-CM

## 2022-12-25 DIAGNOSIS — Z5181 Encounter for therapeutic drug level monitoring: Secondary | ICD-10-CM

## 2022-12-29 ENCOUNTER — Telehealth: Admit: 2022-12-29 | Payer: PRIVATE HEALTH INSURANCE | Primary: Internal Medicine

## 2022-12-29 NOTE — Telephone Encounter
Spoke to patient to remind her to have INR drawn and she said she will go tomorrow.[name], RPHClinical Pharmacist, Anticoagulation ClinicSmilow [facility] [place] [facility]

## 2022-12-30 ENCOUNTER — Ambulatory Visit: Admit: 2022-12-30 | Payer: PRIVATE HEALTH INSURANCE | Attending: Pharmacotherapy | Primary: Internal Medicine

## 2022-12-30 ENCOUNTER — Inpatient Hospital Stay: Admit: 2022-12-30 | Discharge: 2022-12-30 | Payer: PRIVATE HEALTH INSURANCE | Primary: Internal Medicine

## 2022-12-30 ENCOUNTER — Encounter: Admit: 2022-12-30 | Payer: PRIVATE HEALTH INSURANCE | Attending: Pharmacotherapy | Primary: Internal Medicine

## 2022-12-30 DIAGNOSIS — K436 Other and unspecified ventral hernia with obstruction, without gangrene: Secondary | ICD-10-CM

## 2022-12-30 DIAGNOSIS — K55039 Acute (reversible) ischemia of large intestine, extent unspecified: Secondary | ICD-10-CM

## 2022-12-30 DIAGNOSIS — R42 Dizziness and giddiness: Secondary | ICD-10-CM

## 2022-12-30 DIAGNOSIS — D6862 Lupus anticoagulant syndrome: Secondary | ICD-10-CM

## 2022-12-30 DIAGNOSIS — L719 Rosacea, unspecified: Secondary | ICD-10-CM

## 2022-12-30 DIAGNOSIS — F32A Depression: Secondary | ICD-10-CM

## 2022-12-30 DIAGNOSIS — Z7901 Long term (current) use of anticoagulants: Secondary | ICD-10-CM

## 2022-12-30 DIAGNOSIS — K559 Vascular disorder of intestine, unspecified: Secondary | ICD-10-CM

## 2022-12-30 DIAGNOSIS — E119 Type 2 diabetes mellitus without complications: Secondary | ICD-10-CM

## 2022-12-30 DIAGNOSIS — Z5181 Encounter for therapeutic drug level monitoring: Secondary | ICD-10-CM

## 2022-12-30 DIAGNOSIS — E7800 Pure hypercholesterolemia, unspecified: Secondary | ICD-10-CM

## 2022-12-30 LAB — PROTIME AND INR
BKR INR: 3.29 — ABNORMAL HIGH (ref 0.86–1.12)
BKR PROTHROMBIN TIME: 33.8 seconds — ABNORMAL HIGH (ref 9.6–12.3)

## 2022-12-30 NOTE — Progress Notes
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  The patient was contacted regarding lab drawn INR results.  Patient's INR findings and prescribed dose from last visit:  12/18/2022   6:00 PM 12/21/2022   5:19 PM 12/30/2022   4:34 PM [facility]tico[facility] Monitoring [facility] INR Date 12/18/2022 12/21/2022 12/30/2022 Associated INR 1.13 2.21 3.29 Instructions 8/23: 7.5 mg; 8/24: 7.5 mg; 8/25: 7.5 mg; Otherwise 7.5 mg every Tue; 5 mg all other days 7.5 mg every Tue; 5 mg all other days 5 mg every day Sunday Dose 7.5 mg (8/25) 5 mg 5 mg Monday Dose - 5 mg 5 mg Tuesday Dose - 7.5 mg - Wednesday Dose - 5 mg 5 mg Thursday Dose - 5 mg 5 mg Friday Dose 7.5 mg (8/23) 5 mg 5 mg Saturday Dose 7.5 mg (8/24) 5 mg 5 mg Last 7 day dose total 27.5 mg 50 mg 35 mg Next INR Date 12/21/2022 12/28/2022 01/05/2023 The following findings were reported from today's call (no findings if none selected):[]  Missed doses []  Extra doses []  Change in medications []  Change in vitamin k rich food intake (green vegetables, herbals, etc) []  Change in alcohol use []  Change in overall health []  Recent hospitalization / emergency department visit []  Upcoming procedure (including dental procedures) []  Other Comments:       Patient took 5mg  warfarin on Tuesday, 12/29/22 instead of the planned 7.5mg  dose.  See anticoagulation tracker.  The following side effects were reported from today's call (no side effects if none selected):[]  Bleeding []  New bruising []  Warfarin-emergency department visit or hospital admission []  Other Comments:   No side effects Assessment/Plan:Patient's INR is 3.29, which is above goal range of 2.0-3.0.  She was instructed to continue current weekly dose (5mg  warfarin daily) as indicated in the maintenance plan below.  Next INR assessment due 1 week.  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  Electronically Signed by Markus Daft, RPH, December 30, 2022 [facility]  [address] [place], [place] [address]  Phone: [phone]  Fax: [phone]

## 2023-01-01 ENCOUNTER — Emergency Department: Admit: 2023-01-01 | Payer: PRIVATE HEALTH INSURANCE | Primary: Internal Medicine

## 2023-01-01 ENCOUNTER — Inpatient Hospital Stay
Admit: 2023-01-01 | Discharge: 2023-01-05 | Payer: PRIVATE HEALTH INSURANCE | Attending: Surgical Critical Care | Admitting: Surgical Critical Care

## 2023-01-01 DIAGNOSIS — K56609 Unspecified intestinal obstruction, unspecified as to partial versus complete obstruction: Secondary | ICD-10-CM

## 2023-01-01 LAB — BASIC METABOLIC PANEL
BKR ANION GAP: 13 (ref 7–17)
BKR BLOOD UREA NITROGEN: 19 mg/dL (ref 8–23)
BKR BUN / CREAT RATIO: 31.7 — ABNORMAL HIGH (ref 8.0–23.0)
BKR CALCIUM: 9.5 mg/dL (ref 8.8–10.2)
BKR CHLORIDE: 97 mmol/L — ABNORMAL LOW (ref 98–107)
BKR CO2: 24 mmol/L (ref 20–30)
BKR CREATININE: 0.6 mg/dL (ref 0.40–1.30)
BKR EGFR, CREATININE (CKD-EPI 2021): >60 mL/min/{1.73_m2} (ref >=60)
BKR GLUCOSE: 223 mg/dL — ABNORMAL HIGH (ref 70–100)
BKR SODIUM: 134 mmol/L — ABNORMAL LOW (ref 136–144)

## 2023-01-01 LAB — HEPATIC FUNCTION PANEL
BKR A/G RATIO: 1.1 (ref 1.0–2.2)
BKR ALANINE AMINOTRANSFERASE (ALT): 18 U/L (ref 10–35)
BKR ALBUMIN: 4.2 g/dL (ref 3.6–5.1)
BKR ALKALINE PHOSPHATASE: 148 U/L — ABNORMAL HIGH (ref 9–122)
BKR BILIRUBIN DIRECT: <0.2 mg/dL (ref <=0.3)
BKR BILIRUBIN TOTAL: 0.3 mg/dL (ref <=1.2)
BKR GLOBULIN: 3.7 g/dL (ref 2.0–3.9)
BKR PROTEIN TOTAL: 7.9 g/dL (ref 5.9–8.3)

## 2023-01-01 LAB — URINALYSIS WITH CULTURE REFLEX      (BH LMW YH)
BKR BILIRUBIN, UA: NEGATIVE
BKR BLOOD, UA: NEGATIVE
BKR LEUKOCYTE ESTERASE, UA: NEGATIVE
BKR NITRITE, UA: NEGATIVE
BKR PH, UA: 7 (ref 5.5–7.5)
BKR SPECIFIC GRAVITY, UA: >1.05 — ABNORMAL HIGH (ref 1.005–1.030)
BKR UROBILINOGEN, UA (MG/DL): <2 mg/dL (ref <=2.0)

## 2023-01-01 LAB — CBC WITH AUTO DIFFERENTIAL
BKR WAM ABSOLUTE IMMATURE GRANULOCYTES.: 0.05 x 1000/ÂµL (ref 0.00–0.30)
BKR WAM ABSOLUTE LYMPHOCYTE COUNT.: 0.92 x 1000/ÂµL (ref 0.60–3.70)
BKR WAM ABSOLUTE NRBC (2 DEC): 0 x 1000/ÂµL (ref 0.00–1.00)
BKR WAM ANC (ABSOLUTE NEUTROPHIL COUNT): 14.92 x 1000/ÂµL — ABNORMAL HIGH (ref 2.00–7.60)
BKR WAM BASOPHIL ABSOLUTE COUNT.: 0.06 x 1000/ÂµL (ref 0.00–1.00)
BKR WAM BASOPHILS: 0.4 % (ref 0.0–1.4)
BKR WAM EOSINOPHIL ABSOLUTE COUNT.: 0 x 1000/ÂµL (ref 0.00–1.00)
BKR WAM EOSINOPHILS: 0 % (ref 0.0–5.0)
BKR WAM HEMATOCRIT (2 DEC): 43.6 % (ref 35.00–45.00)
BKR WAM HEMOGLOBIN: 13.7 g/dL (ref 11.7–15.5)
BKR WAM IMMATURE GRANULOCYTES: 0.3 % (ref 0.0–1.0)
BKR WAM LYMPHOCYTES: 5.6 % — ABNORMAL LOW (ref 17.0–50.0)
BKR WAM MCH (PG): 26.2 pg — ABNORMAL LOW (ref 27.0–33.0)
BKR WAM MCHC: 31.4 g/dL (ref 31.0–36.0)
BKR WAM MCV: 83.5 fL (ref 80.0–100.0)
BKR WAM MONOCYTE ABSOLUTE COUNT.: 0.4 x 1000/ÂµL (ref 0.00–1.00)
BKR WAM MONOCYTES: 2.4 % — ABNORMAL LOW (ref 4.0–12.0)
BKR WAM MPV: 10.6 fL (ref 8.0–12.0)
BKR WAM NEUTROPHILS: 91.3 % — ABNORMAL HIGH (ref 39.0–72.0)
BKR WAM NUCLEATED RED BLOOD CELLS: 0 % (ref 0.0–1.0)
BKR WAM PLATELETS: 409 x1000/ÂµL (ref 150–420)
BKR WAM RDW-CV: 15.3 % — ABNORMAL HIGH (ref 11.0–15.0)
BKR WAM RED BLOOD CELL COUNT.: 5.22 M/ÂµL (ref 4.00–6.00)
BKR WAM WHITE BLOOD CELL COUNT: 16.4 x1000/ÂµL — ABNORMAL HIGH (ref 4.0–11.0)

## 2023-01-01 LAB — PT/INR AND PTT (BH GH L LMW YH)
BKR INR: 3.79 — ABNORMAL HIGH (ref 0.88–1.14)
BKR PARTIAL THROMBOPLASTIN TIME: 60.4 s — ABNORMAL HIGH (ref 22.5–32.0)
BKR PROTHROMBIN TIME: 37.2 s — ABNORMAL HIGH (ref 9.5–12.1)

## 2023-01-01 LAB — UA REFLEX CULTURE

## 2023-01-01 LAB — POTASSIUM: BKR POTASSIUM: 5.4 mmol/L — ABNORMAL HIGH (ref 3.3–5.3)

## 2023-01-01 LAB — LIPASE: BKR LIPASE: 24 U/L (ref 11–55)

## 2023-01-01 MED ORDER — HYDROMORPHONE 1 MG/ML INJECTION SYRINGE
1 | INTRAVENOUS | Status: DC | PRN
Start: 2023-01-01 — End: 2023-01-03
  Administered 2023-01-01 – 2023-01-03 (×4): 1 mL via INTRAVENOUS

## 2023-01-01 MED ORDER — ONDANSETRON HCL (PF) 4 MG/2 ML INJECTION SOLUTION
4 | Freq: Once | INTRAVENOUS | Status: CP
Start: 2023-01-01 — End: ?
  Administered 2023-01-01: 14:00:00 4 mL via INTRAVENOUS

## 2023-01-01 MED ORDER — IOHEXOL 350 MG IODINE/ML INTRAVENOUS SOLUTION
350 | Freq: Once | INTRAVENOUS | Status: CP | PRN
Start: 2023-01-01 — End: ?
  Administered 2023-01-01: 15:00:00 350 mL via INTRAVENOUS

## 2023-01-01 MED ORDER — ONDANSETRON HCL (PF) 4 MG/2 ML INJECTION SOLUTION
42 mg/2 mL | Freq: Four times a day (QID) | INTRAVENOUS | Status: CP | PRN
Start: 2023-01-01 — End: 2023-01-05
  Administered 2023-01-02: 23:00:00 4 mL via INTRAVENOUS

## 2023-01-01 MED ORDER — SODIUM CHLORIDE 0.9 % BOLUS (NEW BAG)
0.9 | Freq: Once | INTRAVENOUS | Status: CP
Start: 2023-01-01 — End: ?
  Administered 2023-01-01: 18:00:00 0.9 mL/h via INTRAVENOUS

## 2023-01-01 MED ORDER — SODIUM CHLORIDE 0.9 % LARGE VOLUME SYRINGE FOR AUTOINJECTOR
Freq: Once | INTRAVENOUS | Status: CP | PRN
Start: 2023-01-01 — End: ?
  Administered 2023-01-01: 15:00:00 via INTRAVENOUS

## 2023-01-01 MED ORDER — QUETIAPINE (SEROQUEL) IMMEDIATE RELEASE 12.5 MG HALFTAB
12.5 mg | Freq: Every evening | ORAL | Status: CP
Start: 2023-01-01 — End: 2023-01-05
  Administered 2023-01-02 – 2023-01-05 (×4): 12.5 mg via ORAL

## 2023-01-01 MED ORDER — SODIUM CHLORIDE 0.9 % INTRAVENOUS SOLUTION
INTRAVENOUS | Status: DC
Start: 2023-01-01 — End: 2023-01-03
  Administered 2023-01-01 – 2023-01-03 (×3): via INTRAVENOUS

## 2023-01-01 MED ORDER — INSULIN U-100 REGULAR HUMAN 100 UNIT/ML (CORRECTION SCALE)
100 | Freq: Four times a day (QID) | SUBCUTANEOUS | Status: DC
Start: 2023-01-01 — End: 2023-01-03

## 2023-01-01 MED ORDER — HYDROMORPHONE 2 MG/ML INJECTION SOLUTION
2 | Freq: Once | INTRAVENOUS | Status: CP
Start: 2023-01-01 — End: ?
  Administered 2023-01-01: 14:00:00 2 mL via INTRAVENOUS

## 2023-01-01 MED ORDER — HYDROMORPHONE 0.5 MG/0.5 ML INJECTION SYRINGE
0.5 | INTRAVENOUS | Status: DC | PRN
Start: 2023-01-01 — End: 2023-01-03

## 2023-01-01 MED ORDER — VENLAFAXINE ER 75 MG CAPSULE,EXTENDED RELEASE 24 HR
75 mg | ORAL | Status: CP
Start: 2023-01-01 — End: 2023-01-05
  Administered 2023-01-02 – 2023-01-05 (×4): 75 mg via ORAL

## 2023-01-01 NOTE — ED Provider Notes
Chief Complaint Patient presents with  Abdominal Pain   Patient presents with generalized abdominal pain, bloating, n&v since last night. States unable to sleep last night due to pain. HX of bowel obstructions. States feels similar.  An acute or life threatening problem was considered during this evaluation  A decision regarding hospitalization was made during this visit  External data reviewed: Notes (OSH or non-ED)History from independent historian: Relative.  Physical ExamED Triage Vitals [01/01/23 0839]BP: (!) 161/83Pulse: (!) 101Pulse from  O2 sat: n/aResp: 20Temp: (!) 96.5 ?F (35.8 ?C)Temp src: TemporalSpO2: 100 % BP (!) 151/68  - Pulse (!) 97  - Temp 98.5 ?F (36.9 ?C) (Oral)  - Resp 19  - SpO2 98% Physical Exam ProceduresAttestation/Critical CarePatient Reevaluation: Pt with hx of ischemic colitis, DM, lupus anticoagulant, multiple prior SBOs, here with c/o abd pain, nausea, vomiting, bloating, decr flatus. Normal BM yesterday evening.  Does feel similar to what she has had in the past.  Has had multiple prior admissions.  No urinary symptoms, no URI symptoms.  Very uncomfortable on exam, abdomen bloated, tender diffusely.  In for labs, imaging, symptom control, dispo per results. 12:25 PM+SBO, paged surgery. 12:39 PMAdmit to surgery, urine ordered. Clinical Impressions as of 01/01/23 1239 SBO (small bowel obstruction) (HC Code) (HC CODE) (HC Code)  ED DispositionAdmit Arther Dames, MD09/06/24 1239

## 2023-01-01 NOTE — Utilization Review (ED)
UM Status: Managed Medicare IP diagnosed with SBO. Imaging reviewed. Potassium 5.4. WBC 16.4.

## 2023-01-01 NOTE — Plan of Care
Problem: Adult Inpatient Plan of CareGoal: Plan of Care ReviewOutcome: Interventions implemented as appropriate Plan of Care Overview/ Patient Status    Patient received from ER with stable vital signs, complaining of abdominal pain and medicated with dilaudid upon arrival. One liter IVF given and maintenance IVF initiated. Treatment plan reviewed, patient has voided since arrival. Patient encouraged to ask questions, reinforcement provided. Rise Patience, RN

## 2023-01-01 NOTE — ED Notes
11:10 AM Received report from previous RN. Care transferred. Pt presents from home with abdominal distention, tenderness. Hx of obstructions. Pending [place] results. 11:29 AMPt Aox4 forming clear sentences, reports pain is much better rating 4/10 after medicated per previous RN. Pending [place] results and updated plan of care. Family at bedside. 12:36 PMPending surgical consult for SBO finding on [place] scan. Floor Handoff Telemetry: 	[]  Yes		[x]  NoCode Status:   [x]  Full		[]  DNR		[]  DNI		Other (specify):Safety Precautions: []  Fall Risk  []  Sitter   []  Restraints	[]  Suicidal	[x]  None	Other (specify):Mentation/Orientation:	 A&O (Self, person, place, time) x    4      	 Disoriented to:                    	 Special Accommodations: []  Hearing impaired   []  Blind  []  Nonverbal  []  Cognitive impairmentOxygenation Upon Admission: [x]  RA	[]  NC	[]  Venti  []  Simple Mask []  Other	Baseline O2 Status? [x]  Yes	[]  NoAmbulation: [x]  Independent	[]  Cane   []  Walker	[]  Wheelchair	[]  Bedbound		[]  Hemiplegic	[]  Paraplegic	[]  QuadraplegicEliminiation: [x]  Independent	[]  Commode	[]  Bedpan/Urinal  []  Straight Cath []  Foley cath			[]  Urostomy	[]  Colostomy	Other (specify):Diarrhea/Loose stool : []  1x within 24h  []  2x within 24h  []  3x within 24h  [x]  None 	C.Diff Order: 	[]  Ordered- needs to be collected             []  Collected-sent to lab             []  Resulted - Negative C.Diff             []  Resulted - Positive C.Diff[]  Not Ordered   [x]  N/ASkin Alteration: []  Pressure Injury []  Wound []  None [x]  Skin not assessedDiet: []  Regular/No order placed	[x]  NPO		Other (specify):IV Access: [x]  PIV   []  PICC    []  Port    [] Central line    []  A-line    Other (specify)IVF/GTT Running Upon ED Departure? [x]  No	    []  Yes (specify):Outstanding Meds/Treatments/Tests: see MARPatient Belongings: at bedsideAre the belongings documented?          [x]  No	    []  YesIs someone taking belongings home?   [x]  No     []  Yes  Who? (specify)                                   Sheran Luz, RN

## 2023-01-01 NOTE — Plan of Care
Problem: Adult Inpatient Plan of CareGoal: Readiness for Transition of CareOutcome: Initial problem identification Plan of Care Overview/ Patient Status    Per chart - Maria [name] is a 72 y.o. 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 CM met patient at bedside, introduced self and role in care management. Patient lives with husband in a three story house, and she stays on the second floor. Patient is independent and no DME use, no HCS. No DC needs anticipated. Husband transports. Case Management Screening  Flowsheet Row Most Recent Value Case Management Screening: Chart review completed. If YES to any question below then proceed to CM Eval/Plan  Is there a change in their cognitive function No Do you anticipate that the pt will have any discharge needs requiring CM intervention? No Has there been a readmission within the last 30 days and/or four (4) encounters (encounters include: ED, OBS, Inpatient) within the last six (6) months? No Were there services prior to admission ( Examples: Assisted Living, HD, Homecare, Extended Care Facility, Methadone, SNF, Outpatient Infusion Center) No Negative/Positive Screen Negative Screening: Case Management department will follow patient's progress and discuss plan of care with treatment team. Case manager will take lead to arrange post-acute care services and continue to work with the care team as the patient progresses towards discharge Yes Case Manager Attestation  I have reviewed the medical record and completed the above screen. CM staff will follow patient's progress and discuss the plan of care with the Treatment Team. Yes  Catha Nottingham RN BSNCare ManagerYale Advanced Ambulatory Surgical Center Inc HospitalMHB [phone]

## 2023-01-01 NOTE — ED Notes
9:43 AM patient presents to ED from home. Patient reports low mid abdominal pain that began this morning. Feels bloated. Abdomen rounded and firm. Denies passing gas. Reports last BM last night at 530pm.  Patient reports hx of multiple bowel obstructions. IV line inserted and labs drawn. Patient medicated for pain and nausea per MAR. Pending [place] scan.10:56 AMPatient went for [place] and has returned. Reports pain relief after dilaudid given.

## 2023-01-01 NOTE — Other
Emergency & General [facility], [facility] CampusHistory & Physical ExaminationHistory provided by: the patientHistory limited by: no limitationsPatient presents from: HomeConsult requested by: Dr.GoldflamRequested on behalf of: Dr.JonesSubjective: Chief Complaint/Reason for Consultation: Recurrent SBOHPISofia [name] is a delightful 72 y.o. 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 She presents to the ED this morning with nausea, vomiting, and abdominal pain - all of which feel similar to her previous obstructions. She did have a BM last night and it was described as normal. She confidently tells me the pain associated with this episode is not worse than prior obstructions and feels about the same as the one in May. Mrs.Schlender tells me she had some crackers and soda around 10 pm and then woke up with pain around 1 am. She thought it was just gas pain and tried to have a bowel movement but to no avail. She said she was burping but not passing flatus. Currently she is complaining of diffuse abdominal pain. She says she is no longer nauseated and also voices wishing to avoid the NGT if possible. She is bloated. Of note, she had R knee surgery about 2 weeks ago with Dr.Diana. She is due to start PT on Tuesday. She is ambulating with a cane at present. 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 [name] 04/2013), - Stage 2 Hatman's procedure with extensive LOA [name] 01/2014), - Ex lap, LOA and ventral hernia repair with mesh [name] 07/2014), - Exlap with extensive LOA and enterotomy repair [name] 11/2015), - Ventral hernia repair with mesh [name] 01/2020) Review of Systems: Review of SystemsAs above Medical, Surgical, Social, and Family History: PMH PSH Past Medical History: Diagnosis Date  Acute ischemic colitis (HC Code) (HC CODE) (HC Code) 2015  Depression   Diabetes mellitus (HC Code)   Hypercholesteremia   Lupus anticoagulant disorder (HC Code)   Rosacea   Ventral hernia with bowel obstruction   Vertigo   Past Surgical History: Procedure Laterality Date  ABDOMINAL ADHESION SURGERY  2016  BLEPHAROPLASTY Bilateral 05/2016  COLOSTOMY  05/27/13  COLOSTOMY CLOSURE    HYSTEROSCOPY W/ POLYPECTOMY  06/18/2020  KNEE LIGAMENT RECONSTRUCTION Right 2004  LAPAROSCOPIC NISSEN FUNDOPLICATION    2000 ?  SINUS SURGERY    THYROID BIOPSY    TUBAL LIGATION    UMBILICAL HERNIA REPAIR    VENTRAL HERNIA REPAIR    Social History Family History Social History Socioeconomic History  Marital status: Married   Spouse name: Not on file  Number of children: Not on file  Years of education: Not on file  Highest education level: Not on file Occupational History  Not on file Tobacco Use  Smoking status: Never  Smokeless tobacco: Never Vaping Use  Vaping Use: Never used Substance and Sexual Activity  Alcohol use: Not Currently   Alcohol/week: 1.0 standard drink of alcohol   Types: 1 Glasses of wine per week   Comment: 1-2/year  Drug use: No  Sexual activity: Not Currently   Partners: Male Other Topics Concern  Not on file Social History Narrative  Not on file Social Determinants of Health Financial Resource Strain: Not on file Food Insecurity: No Food Insecurity (09/10/2022)  Hunger Vital Sign   Worried About Running Out of Food in the Last Year: Never true   Ran Out of Food in the Last Year: Never true Transportation  Needs: No Transportation Needs (09/10/2022)  PRAPARE - Designer, jewellery (Medical): No   Lack of Transportation (Non-Medical): No Physical Activity: Not on file Stress: Not on file Social Connections: Not on file Intimate Partner Violence: Not on file Housing Stability: Low Risk  (09/10/2022)  Housing Stability   Housing Stability: I have a steady place to live   Housing Stability: Not on file  Family History Problem Relation Age of Onset  Bladder cancer Mother   Colon cancer Father   Diabetes Father   Diabetes Sister   Colon cancer Paternal Aunt   Colon cancer Maternal Cousin       died of colona cancer at age 52  Breast cancer Maternal Cousin       2 cousins with breast cancer  Diabetes Paternal Uncle   Prior to Admission Medications: No current facility-administered medications on file prior to encounter. Current Outpatient Medications on File Prior to Encounter Medication Sig Dispense Refill  acetaminophen (TYLENOL) 500 mg tablet Take 1 tablet (500 mg total) by mouth every 4 (four) hours as needed. 30 tablet 11  biotin 1 mg tablet Take 1 tablet (1,000 mcg total) by mouth daily.    cetirizine (ZYRTEC) 10 mg tablet Take 1 tablet (10 mg total) by mouth daily. 14 tablet 0  cholecalciferol, vitamin D3, 125 mcg (5,000 unit) tablet Take 1 tablet (5,000 Units total) by mouth every other day.    diphenhydramine HCl (BENADRYL ALLERGY ORAL) Take 1 tablet by mouth as needed.    emollient (BIAFINE) cream Apply topically 2 (two) times daily. 454 g 0  enoxaparin (LOVENOX) 100 mg/mL syringe Inject 1 mL (100 mg total) under the skin daily. 7 mL 1  enoxaparin (LOVENOX) 100 mg/mL syringe Inject 1 mL (100 mg total) under the skin daily. 7 mL 1  gabapentin (NEURONTIN) 300 mg capsule Take 1 capsule (300 mg total) by mouth nightly.    glipiZIDE (GLUCOTROL) 10 mg Immediate Release tablet Take 1 tablet (10 mg total) by mouth daily before breakfast. 90 tablet 0  insulin glargine (LANTUS) 100 unit/mL vial Inject 10 Units under the skin nightly.    JARDIANCE 25 mg tablet Take 1 tablet (25 mg total) by mouth daily. losartan (COZAAR) 25 mg tablet Take 1 tablet (25 mg total) by mouth daily.    meclizine (ANTIVERT) 12.5 mg tablet Take 1 tablet (12.5 mg total) by mouth daily as needed for Dizziness 30 tablet 1  melatonin 10 mg Tab Take 10 mg by mouth nightly as needed.     multivitamin tablet Take 1 tablet by mouth daily.    olopatadine (PATADAY) 0.2 % ophthalmic solution Place 1 drop into both eyes daily. 2.5 mL 6  ondansetron (ZOFRAN) 4 MG tablet Take 1 tablet (4 mg total) by mouth every 8 (eight) hours as needed for nausea.    ONETOUCH DELICA PLUS LANCET 33 gauge     ONETOUCH ULTRA TEST test strips     ONETOUCH ULTRA2 METER device USE TO CHECK GLUCOSE THREE TIMES DAILY AS NEEDED FOR 30 DAYS    phytonadione, vit K1, (VITAMIN K) 100 mcg tablet Take 1 tablet (100 mcg total) by mouth daily.    pravastatin (PRAVACHOL) 40 MG tablet Take 1.5 tablets (60 mg total) by mouth nightly.    QUEtiapine (SEROQUEL) 50 mg Immediate Release tablet Take 1 tablet (50 mg total) by mouth nightly.    senna (SENOKOT) 8.6 mg tablet Take 2 tablets by mouth nightly.. 30 tablet 11  sitaGLIPtin (JANUVIA) 100  MG tablet Take 1 tablet (100 mg total) by mouth daily.    triamcinolone (KENALOG) 0.1 % cream Apply topically 2 (two) times daily. 30 g 0  venlafaxine (EFFEXOR XR) 150 mg XR 24 hr extended release capsule Take 1 capsule (150 mg total) by mouth daily.    warfarin (COUMADIN) 5 mg tablet Take 2.5 mg on Tuesdays and Thursdays and 5 mg all other days of the week or as directed by warfarin clinic 90 tablet 3  warfarin (COUMADIN) 5 mg tablet daily. Take by mouth 7.5 mg (5 mg x 1.5) every Tue, Thu; 5 mg (5 mg x 1) all other days    Allergies: Allergies Allergen Reactions  Morphine Hives and Itching   Pt tolerated morphine during admission 08/08/16-08/09/16.  Pt premedicated with diphenhydramine  Biaxin  [Clarithromycin]   Latex, Natural Rubber Rash  Mitosol [Mitomycin] Rash  Oxycodone Rash Objective/Data: Vitals:Last 24 hours: Temp:  [96.5 ?F (35.8 ?C)-98.5 ?F (36.9 ?C)] 98.5 ?F (36.9 ?C)Pulse:  [83-101] 97Resp:  [19-20] 19BP: (111-161)/(66-83) 151/68SpO2:  [98 %-100 %] 98 %Physical Examination: Physical ExamAlert, NADRegular rateNon laboredSoftly distended, typanitic. Really non tender to palpation. No guarding. Not peritoneal. Data: Labs: Component    Latest Ref Rng 01/01/2023 WBC    4.0 - 11.0 x1000/?L 16.4 (H)  RBC    4.00 - 6.00 M/?L 5.22  Hemoglobin    11.7 - 15.5 g/dL 65.7  Hematocrit    84.69 - 45.00 % 43.60  RDW-CV    11.0 - 15.0 % 15.3 (H)  Platelets    150 - 420 x1000/?L 409  Neutrophils    39.0 - 72.0 % 91.3 (H)  ANC (Abs Neutrophil Count)    2.00 - 7.60 x 1000/?L 14.92 (H)   Component    Latest Ref Rng 01/01/2023 Sodium    136 - 144 mmol/L 134 (L)  Potassium --  Chloride    98 - 107 mmol/L 97 (L)  CO2    20 - 30 mmol/L 24  Anion Gap    7 - 17  13  Glucose    70 - 100 mg/dL 629 (H)  BUN    8 - 23 mg/dL 19  Creatinine    5.28 - 1.30 mg/dL 4.13  Calcium    8.8 - 10.2 mg/dL 9.5  BUN/Creatinine Ratio    8.0 - 23.0  31.7 (H)  eGFR (Creatinine)    >=60 mL/min/1.42m2 >60   Diagnostic Review:[place] Abdomen Pelvis with IV Contrast without oral (BMI>25)Result Date: 9/6/2024CT ABDOMEN PELVIS W IV CONTRAST HISTORY: Bowel obstruction suspected COMPARISON: [place] ABDOMEN PELVIS W IV CONTRAST 2022-09-10 TECHNIQUE: Bells images of the abdomen and pelvis were obtained from the diaphragms to the pubic symphysis after the administration of intravenous contrast.   IV CONTRAST:  80 ML IOHEXOL (OMNIPAQUE) 350 MG IODINE/ML INJECTION FINDINGS: LUNG BASES: Mild dependent atelectasis. 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 LIVER: Stable right hepatic hypodensity too small to characterize. GALLBLADDER: Unremarkable. SPLEEN: Unremarkable. PANCREAS: Unremarkable. ADRENALS: Unremarkable. KIDNEYS: Unremarkable. BOWEL: Status post right hemicolectomy. Redemonstration of acute high-grade small bowel obstruction with a transition point in the left lower abdomen (series 4 image 491) where small bowel loops are adherent to the anterior abdominal wall likely due to adhesions. Small bowel loops proximal to the transition point of equalized. Proximal small bowel loops are dilated to 4.7 cm. No evidence of perforation or drainable collection. Moderate stool burden in the colon. PERITONEUM: Unremarkable. LYMPH NODES: Unremarkable. VESSELS: Atherosclerotic vascular calcifications. URINARY BLADDER: Unremarkable. PELVIS: No  mass. BONES & SOFT TISSUE: No acute or aggressive osseous lesions. Evidence of prior ventral hernia mesh repair. There is a lower abdominal wall hernia containing fat and nonobstructed loop of bowel. Small nonspecific 11 mm nodule in the subcutaneous left anterior abdominal wall to the left of the umbilicus, new since May 2024. Correlate with physical examination. 1. Acute recurrent high-grade SMALL BOWEL OBSTRUCTION with transition in the left lower abdomen. No perforation or drainable collection. Surgical consult recommended. 2. Small 1 cm nodule in the subcutaneous fat of the left lower abdominal wall. Correlate with physical examination. [facility] Notify System Classification: Urgent result. Report initiated by:  Domenick Bookbinder, MD, MS Reported and signed by: Etheleen Nicks, MD  [facility] Assessment and Plan Jessicca Filippone is a 72 y.o. 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 - Admit to Dr.Jones- Lets check a UA given her leukocytosis - Send INR - NPO, IVF- Strict I/O- Will hold DVT ppx pending INR (she says her INR was 3 yesterday)- OK for SCDs- Pulmonary toilet- Low threshold for NGT - Sliding scale for glucose control- Remainder of home meds will need to be held given NPO status- Pain and nausea control - Ambulate with cane; will have PT evaluate her - AROBF- Hopeful for non-operative management given her extensive surgical history- Will d/w Dr.Jones Signed:Naythan Douthit Roselind Rily 9/6/202412:26 PMContact Info:[facility] Floor Dynamic Role: [phone] (Pager [phone])[facility] Consult Dynamic Role: [phone] total, 45 minutes were spent providing consultative services for this patient including extensive chart review, interviewing and examining the patient, and developing a treatment plan. Attending Addendum:

## 2023-01-02 LAB — PROTIME AND INR
BKR INR: 3.19 — ABNORMAL HIGH (ref 0.88–1.14)
BKR PROTHROMBIN TIME: 31.6 s — ABNORMAL HIGH (ref 9.5–12.1)

## 2023-01-02 LAB — CBC WITH AUTO DIFFERENTIAL
BKR WAM ABSOLUTE IMMATURE GRANULOCYTES.: 0.03 x 1000/ÂµL (ref 0.00–0.30)
BKR WAM ABSOLUTE LYMPHOCYTE COUNT.: 1.86 x 1000/ÂµL (ref 0.60–3.70)
BKR WAM ABSOLUTE NRBC (2 DEC): 0 x 1000/ÂµL (ref 0.00–1.00)
BKR WAM ANC (ABSOLUTE NEUTROPHIL COUNT): 6.16 x 1000/ÂµL (ref 2.00–7.60)
BKR WAM BASOPHIL ABSOLUTE COUNT.: 0.04 x 1000/ÂµL (ref 0.00–1.00)
BKR WAM BASOPHILS: 0.4 % (ref 0.0–1.4)
BKR WAM EOSINOPHIL ABSOLUTE COUNT.: 0.19 x 1000/ÂµL (ref 0.00–1.00)
BKR WAM EOSINOPHILS: 2.1 % (ref 0.0–5.0)
BKR WAM HEMATOCRIT (2 DEC): 36.2 % (ref 35.00–45.00)
BKR WAM HEMOGLOBIN: 11.2 g/dL — ABNORMAL LOW (ref 11.7–15.5)
BKR WAM IMMATURE GRANULOCYTES: 0.3 % (ref 0.0–1.0)
BKR WAM LYMPHOCYTES: 20.9 % (ref 17.0–50.0)
BKR WAM MCH (PG): 26.1 pg — ABNORMAL LOW (ref 27.0–33.0)
BKR WAM MCHC: 30.9 g/dL — ABNORMAL LOW (ref 31.0–36.0)
BKR WAM MCV: 84.4 fL (ref 80.0–100.0)
BKR WAM MONOCYTE ABSOLUTE COUNT.: 0.61 x 1000/ÂµL (ref 0.00–1.00)
BKR WAM MONOCYTES: 6.9 % (ref 4.0–12.0)
BKR WAM MPV: 11.1 fL (ref 8.0–12.0)
BKR WAM NEUTROPHILS: 69.4 % (ref 39.0–72.0)
BKR WAM NUCLEATED RED BLOOD CELLS: 0 % (ref 0.0–1.0)
BKR WAM PLATELETS: 297 x1000/ÂµL (ref 150–420)
BKR WAM RDW-CV: 15.7 % — ABNORMAL HIGH (ref 11.0–15.0)
BKR WAM RED BLOOD CELL COUNT.: 4.29 M/ÂµL (ref 4.00–6.00)
BKR WAM WHITE BLOOD CELL COUNT: 8.9 x1000/ÂµL (ref 4.0–11.0)

## 2023-01-02 LAB — BASIC METABOLIC PANEL
BKR ANION GAP: 9 (ref 7–17)
BKR BLOOD UREA NITROGEN: 14 mg/dL (ref 8–23)
BKR BUN / CREAT RATIO: 17.5 (ref 8.0–23.0)
BKR CALCIUM: 8.2 mg/dL — ABNORMAL LOW (ref 8.8–10.2)
BKR CHLORIDE: 106 mmol/L (ref 98–107)
BKR CO2: 24 mmol/L (ref 20–30)
BKR CREATININE: 0.8 mg/dL (ref 0.40–1.30)
BKR EGFR, CREATININE (CKD-EPI 2021): >60 mL/min/{1.73_m2} (ref >=60)
BKR GLUCOSE: 106 mg/dL — ABNORMAL HIGH (ref 70–100)
BKR POTASSIUM: 4.5 mmol/L (ref 3.3–5.3)
BKR SODIUM: 139 mmol/L (ref 136–144)

## 2023-01-02 LAB — MAGNESIUM: BKR MAGNESIUM: 1.9 mg/dL (ref 1.7–2.4)

## 2023-01-02 LAB — PHOSPHORUS     (BH GH L LMW YH): BKR PHOSPHORUS: 2.6 mg/dL (ref 2.2–4.5)

## 2023-01-02 MED ORDER — DIPHENHYDRAMINE 25 MG CAPSULE
25 mg | Freq: Once | ORAL | Status: CP
Start: 2023-01-02 — End: ?
  Administered 2023-01-03: 01:00:00 25 mg via ORAL

## 2023-01-02 MED ORDER — DIPHENHYDRAMINE 25 MG CAPSULE
25 | Freq: Once | ORAL | Status: CP
Start: 2023-01-02 — End: ?
  Administered 2023-01-02: 14:00:00 25 mg via ORAL

## 2023-01-02 NOTE — Plan of Care
Inpatient Physical Therapy Evaluation IP Adult PT Eval/Treat - 01/02/23 0820    Date of Visit / Treatment  Date of Visit / Treatment 01/02/23   Note Type Evaluation   Progress Report Due 01/16/23   Start Time 740   End Time 820   Total Treatment Time 40    General Information  Pertinent History Of Current Problem Pt is a 72 y/o F presenting to [facility] with abdominal pain, was found to have recurrent high grade SOB. Pt with PMH of DM, HLD, lupus anticoagulant disorder (on Warfarin), with extensive abdominal surgery history. Pt also reports right meniscal tear repair ~2 weeks ago.   Subjective I just had knee surgery.   General Observations Pt supine in bed, RA, NAD, PIV   Precautions/Limitations Fall Precautions    Prior Level of Functioning/Social History  Additional Comments Pt states she lives with her husband in a 3 level home. She stays on the 2nd floor. She is indepedent at baseline without AD. Has tub shower with grab bars. Walking with cane s/p knee sx. Owns RW.    Vital Signs and Orthostatic Vital Signs  Vital Signs Free text RA, NAD    Pain/Comfort  Pain Comment (Pre/Post Treatment Pain) c/o min R knee pain; RN aware, +ice    Patient Coping  Observed Emotional State accepting   Verbalized Emotional State acceptance    Cognition  Overall Cognitive Status Within Functional Limits   Orientation Level Oriented X4   Level of Consciousness alert   Following Commands Follows all commands and directions without difficulty   Cognition Comments pleasant and agreeable    Range of Motion  Range of Motion Examination WFL except   Range of Motion Comments R knee flexion slightly limited 2' pain    Manual Muscle Testing  Manual Muscle Testing Comments BLE 4/5    Muscle Tone  Muscle Tone Testing Results No muscle tone deficits noted    Coordination  Coordination Comments [facility]    Skin Assessment  Skin Assessment See Nursing Documentation    Balance  Sitting Balance: Static  GOOD     Maintains static position against moderate resistance with no Assistive Device   Sitting Balance: Dynamic  GOOD    Independent in functional balance activities (ambulates on unlevel surfaces, turns, catches ball)   Standing Balance: Static GOOD-    Maintains static position against minimal resistance with no Assistive Device   Standing Balance: Dynamic  FAIR+    Performs dynamic activities through full range with Supervision   Balance Assist Device Rolling walker    Bed Mobility  Supine-to-Sit Independence/Assistance Level Modified independence   Supine-to-Sit Assist Device Head of bed elevated    Sit-Stand Transfer Training  Sit-to-Stand Transfer Independence/Assistance Level Stand-by assist   Sit-to-Stand Transfer Assist Device Rolling walker   Stand-to-Sit Transfer Independence/Assistance Level Stand-by assist   Stand-to-Sit Transfer Assist Device Rolling walker   Sit-Stand Transfer Comments steady in standing    Gait Training  Independence/Assistance Level  Stand-by assist   Assistive Device  Rolling walker   Gait Distance 250 feet   Gait Analysis Deviations decreased cadence;decreased step length;decreased stride length;decreased toe-to-floor clearance;decreased weight-shifting ability   Gait Training Comments overall steady with no overt LOB, decr R knee flexion throughout    Stair Performance  Number of Stairs 5 steps   Stair Railings present on left side;ascending;present on right side;descending   Independence/Assistance Level  Stand-by assist   Assistive Device Rail   Technique step to step  Stair Performance Comment steady with no overt LOB or buckling noted; further steps limited 2' IV    Handoff Documentation  Handoff Patient in chair;Patient instructed to call nursing for mobility;Discussed with nursing    Activity Tolerance  Activity Tolerance Comments Good    PT- AM-PAC - Basic Mobility Screen- How much help from another person do you currently need.....  Turning from your back to your side while in a a flat bed without using rails? 4 - None - Does not require any help and does the activity independently. Can use assistive devices.   Moving from lying on your back to sitting on the side of a flat bed without using bed rails? 4 - None - Does not require any help and does the activity independently. Can use assistive devices.   Moving to and from a bed to a chair (including a wheelchair)? 4 - None - Does not require any help and does the activity independently. Can use assistive devices.   Standing up from a chair using your arms(e.g., wheelchair or bedside chair)? 4 - None - Does not require any help and does the activity independently. Can use assistive devices.   To walk in a hospital room? 3 - A Little - Requires a little help (supervision, minimal assistance). Can use assistive devices.   Climbing 3-5 steps with a railing? 3 - A Little - Requires a little help (supervision, minimal assistance). Can use assistive devices.   AMPAC Mobility Score 22   TARGET Highest Level of Mobility Mobility Level 7 Walk 25+feet   ACTUAL Highest Level of Mobility Mobility Level 8, Walk 250+ feet    Therapeutic Functional Activity  Therapeutic Functional Activity Comments edu on safe mobility, role of PT, POC, goals, d/c planning    Clinical Impression  Initial Assessment Pt tolerated PT evaluation well. Pt steady with all mobility utilizing RW 2' R knee pain. Rec d/c home when medically ready.   Criteria for Skilled Therapeutic Interventions Met yes;treatment indicated   Rehab Potential good, to achieve stated therapy goals    Patient/Family Stated Goals  Patient/Family Stated Goal(s) decrease pain;return home    Frequency/Equipment Recommendations  PT Frequency 3x per week   Next Treatment Expected 01/04/23   PT/PTA completing this assessment Mena Lienau    PT Recommendations for Inpatient Admission  Activity/Level of Assist ambulate;with rolling walker    Education - Learning Assessment   Education Topic Functional mobility techniques;Safety;Uses of devices/equipment;Exercise program;Energy conservation;Therapy role/rehab process;Discharge planning;Assistance/guarding   Learners Patient   Readiness Eager;Acceptance   Method Explanation;Demonstration   Response Verbalizes Understanding;Demonstrated Understanding    PT Discharge Summary  Physical Therapy Disposition Recommendation Home   Additional Physical Therapy Disposition Recommendations Outpatient Physical Therapy     Problem: Physical Therapy GoalsGoal: Physical Therapy GoalsDescription: PT GOALS1. Patient will perform bed mobility independently2. Patient will perform transfers independently3. Patient will ambulate a minimum of 150 feet independently4. Patient will ascend and descend a flight of stairs with rail with modified independenceOutcome: Interventions implemented as appropriate Luan Pulling, DPT

## 2023-01-02 NOTE — Progress Notes
[facility]	 [facility]	SURGERY PROGRESS NOTEINTERIM EVENTS - NAEO- Passing flatus - pt reports lots of gas - Improvement in abdominal pain- Afebrile, HDSOBJECTIVE Physical ExamGeneral: Awake, alert, in NAD.Chest: Symmetric chest expansion, normal work of breathing.Heart: Regular rate, hemodynamically stable.Abdomen: Soft, nontender to palpation, nondistended, no rebound or guarding.Extremities: Warm and well perfused.Neuro: Spontaneously moving all four extremities.VitalsTemp:  [96.5 ?F (35.8 ?C)-98.5 ?F (36.9 ?C)] 98.4 ?F (36.9 ?C)Pulse:  [82-113] 113Resp:  [17-20] 20BP: (111-161)/(65-83) 138/65SpO2:  [94 %-100 %] 97 %Device (Oxygen Therapy): room airO2 Flow (L/min):  [0] 0LabsRecent Labs Lab 09/06/240927 WBC 16.4* HGB 13.7 HCT 43.60 PLT 409 Recent Labs Lab 09/06/240927 09/06/241017 09/06/241721 09/06/242338 NA 134*  --   --   --  K  --  5.4*  --   --  CL 97*  --   --   --  CO2 24  --   --   --  BUN 19  --   --   --  CREATININE 0.60  --   --   --  GLU 223*  --  98 112* CALCIUM 9.5  --   --   --  Recent Labs Lab 09/06/240927 ALT 18 ALKPHOS 148* BILITOT 0.3 BILIDIR <0.2 LIPASE 24 Recent Labs Lab 09/04/241302 09/06/241347 PTT  --  60.4* INR 3.29* 3.79* ImagingCT Abdomen Pelvis with IV Contrast without oral (BMI>25)Result Date: 9/6/20241. Acute recurrent high-grade SMALL BOWEL OBSTRUCTION with transition in the left lower abdomen. No perforation or drainable collection. Surgical consult recommended. 2. Small 1 cm nodule in the subcutaneous fat of the left lower abdominal wall. Correlate with physical examination. [facility] Notify System Classification: Urgent result. Report initiated by:  Domenick Bookbinder, MD, MS Reported and signed by: Etheleen Nicks, MD  [facility]  ASSESSMENT AND PLAN Maria [name] is a 72 y.o. 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 Was made NPO yesterday, with no nausea or vomiting overnight and passing lots of flatus. Plan: - Due to passing lots of gas, will advance diet to CLD and monitor tolerance - If N/V returns, will return to NPO, and perform Becher protocol - Pending tolerance and bowel movements, may advance diet further later todayDiet: CLDIVF: NS 169ml/hrPain control: dilaudid 0.5/1Abx: noneMonitor I/OsOOB/AmbulateEncourage IS useDVT ppx: none (home Warfarin held, most recent INR 3)Dispo planning: pending clinical courseCode: Full interventionsSigned: Gertie Baron, MD, MSPGY-1Available on MHB09/07/246:18 AMATTENDING ADDENDUM

## 2023-01-02 NOTE — Plan of Care
Problem: Adult Inpatient Plan of CareGoal: Plan of Care ReviewOutcome: Interventions implemented as appropriate Plan of Care Overview/ Patient Status    A&Ox4. NPO. BG Q6hr. IVF infusing NS @ 125cc/hr. C/o pain managed with PRN dilaudid. Denies n/v overnight. + flatus. Abd soft, nondistended. Voiding OOB x up to toilet. VSS on RA. Safety maintained - bed in lowest position, call bell in reach. Meds & POC reviewed, pt verbalizes understanding. Electronically Signed by Arnell Asal, RN, January 02, 2023

## 2023-01-02 NOTE — Plan of Care
Problem: Adult Inpatient Plan of CareGoal: Plan of Care ReviewOutcome: Interventions implemented as appropriateFlowsheets (Taken 01/02/2023 1231)Plan of Care Reviewed With: patient Plan of Care Overview/ Patient Status    Patient teaching provided regarding activity level, diet advancement and IV therapy. Patient complained of abdominal pain, medicated with dilaudid for pain. Patient reported some itching after dilaudid given patient states I get benadryl when she gets dilaudid. Dr. Eulis Canner aware and given benadryl times one with good results. Increase activity encouraged, patient utilizing a walker as right knee discomfort from recent surgery persists. Patient tolerating clear liquids. Reinforcement of teaching provided. Maria [name], RNPatient complained of nausea after eating her clear liquid diet.Medicated with zofran and Dr. Eulis Canner aware. Maria Patience, RN

## 2023-01-03 LAB — PROTIME AND INR
BKR INR: 2.23 — ABNORMAL HIGH (ref 0.88–1.14)
BKR PROTHROMBIN TIME: 22.6 s — ABNORMAL HIGH (ref 9.5–12.1)

## 2023-01-03 LAB — CBC WITH AUTO DIFFERENTIAL
BKR WAM ABSOLUTE IMMATURE GRANULOCYTES.: 0.02 x 1000/ÂµL (ref 0.00–0.30)
BKR WAM ABSOLUTE LYMPHOCYTE COUNT.: 1.52 x 1000/ÂµL (ref 0.60–3.70)
BKR WAM ABSOLUTE NRBC (2 DEC): 0 x 1000/ÂµL (ref 0.00–1.00)
BKR WAM ANC (ABSOLUTE NEUTROPHIL COUNT): 5.79 x 1000/ÂµL (ref 2.00–7.60)
BKR WAM BASOPHIL ABSOLUTE COUNT.: 0.03 x 1000/ÂµL (ref 0.00–1.00)
BKR WAM BASOPHILS: 0.4 % (ref 0.0–1.4)
BKR WAM EOSINOPHIL ABSOLUTE COUNT.: 0.24 x 1000/ÂµL (ref 0.00–1.00)
BKR WAM EOSINOPHILS: 2.9 % (ref 0.0–5.0)
BKR WAM HEMATOCRIT (2 DEC): 34.5 % — ABNORMAL LOW (ref 35.00–45.00)
BKR WAM HEMOGLOBIN: 10.8 g/dL — ABNORMAL LOW (ref 11.7–15.5)
BKR WAM IMMATURE GRANULOCYTES: 0.2 % (ref 0.0–1.0)
BKR WAM LYMPHOCYTES: 18.6 % (ref 17.0–50.0)
BKR WAM MCH (PG): 26.3 pg — ABNORMAL LOW (ref 27.0–33.0)
BKR WAM MCHC: 31.3 g/dL (ref 31.0–36.0)
BKR WAM MCV: 84.1 fL (ref 80.0–100.0)
BKR WAM MONOCYTE ABSOLUTE COUNT.: 0.57 x 1000/ÂµL (ref 0.00–1.00)
BKR WAM MONOCYTES: 7 % (ref 4.0–12.0)
BKR WAM MPV: 10.6 fL (ref 8.0–12.0)
BKR WAM NEUTROPHILS: 70.9 % (ref 39.0–72.0)
BKR WAM NUCLEATED RED BLOOD CELLS: 0 % (ref 0.0–1.0)
BKR WAM PLATELETS: 290 x1000/ÂµL (ref 150–420)
BKR WAM RDW-CV: 15.1 % — ABNORMAL HIGH (ref 11.0–15.0)
BKR WAM RED BLOOD CELL COUNT.: 4.1 M/ÂµL (ref 4.00–6.00)
BKR WAM WHITE BLOOD CELL COUNT: 8.2 x1000/ÂµL (ref 4.0–11.0)

## 2023-01-03 LAB — BASIC METABOLIC PANEL
BKR ANION GAP: 8 (ref 7–17)
BKR BLOOD UREA NITROGEN: 10 mg/dL (ref 8–23)
BKR BUN / CREAT RATIO: 14.3 (ref 8.0–23.0)
BKR CALCIUM: 8.6 mg/dL — ABNORMAL LOW (ref 8.8–10.2)
BKR CHLORIDE: 106 mmol/L (ref 98–107)
BKR CO2: 24 mmol/L (ref 20–30)
BKR CREATININE: 0.7 mg/dL (ref 0.40–1.30)
BKR EGFR, CREATININE (CKD-EPI 2021): >60 mL/min/{1.73_m2} (ref >=60)
BKR GLUCOSE: 87 mg/dL (ref 70–100)
BKR POTASSIUM: 4.2 mmol/L (ref 3.3–5.3)
BKR SODIUM: 138 mmol/L (ref 136–144)

## 2023-01-03 LAB — MAGNESIUM: BKR MAGNESIUM: 1.8 mg/dL (ref 1.7–2.4)

## 2023-01-03 LAB — PHOSPHORUS     (BH GH L LMW YH): BKR PHOSPHORUS: 2.8 mg/dL (ref 2.2–4.5)

## 2023-01-03 MED ORDER — TRAMADOL 50 MG TABLET
50 | ORAL | Status: DC | PRN
Start: 2023-01-03 — End: 2023-01-05

## 2023-01-03 MED ORDER — TRAMADOL (ULTRAM) 25 MG HALFTAB
25 | ORAL | Status: DC | PRN
Start: 2023-01-03 — End: 2023-01-05

## 2023-01-03 MED ORDER — ENOXAPARIN 100 MG/ML SUBCUTANEOUS SYRINGE
100 | SUBCUTANEOUS | Status: DC
Start: 2023-01-03 — End: 2023-01-05
  Administered 2023-01-03 – 2023-01-04 (×2): 100 mL via SUBCUTANEOUS

## 2023-01-03 MED ORDER — LOSARTAN 25 MG TABLET
25 mg | Freq: Every day | ORAL | Status: DC
Start: 2023-01-03 — End: 2023-01-05
  Administered 2023-01-04 – 2023-01-05 (×2): 25 mg via ORAL

## 2023-01-03 MED ORDER — INSULIN U-100 REGULAR HUMAN 100 UNIT/ML (CORRECTION SCALE)
100 | Freq: Before meals | SUBCUTANEOUS | Status: DC
Start: 2023-01-03 — End: 2023-01-05
  Administered 2023-01-03 – 2023-01-04 (×3): 100 unit/mL via SUBCUTANEOUS

## 2023-01-03 NOTE — Progress Notes
[facility]	 [facility]	SURGERY PROGRESS NOTEINTERIM EVENTS - NAEO- Tolerated CLD- Pain improving- Minimal nausea, no vomiting- Afebrile, HDSOBJECTIVE Physical ExamGeneral: Awake, alert, in NAD.Chest: Symmetric chest expansion, normal work of breathing.Heart: Regular rate, hemodynamically stable.Abdomen: Soft, nontender to palpation, nondistended, no rebound or guarding.Extremities: Warm and well perfused.Neuro: Spontaneously moving all four extremities.VitalsTemp:  [98 ?F (36.7 ?C)-98.4 ?F (36.9 ?C)] 98 ?F (36.7 ?C)Pulse:  [66-89] 66Resp:  [16-20] 16BP: (126-139)/(66-78) 133/78SpO2:  [96 %-98 %] 96 %Device (Oxygen Therapy): room airLabsRecent Labs Lab 09/06/240927 09/07/240608 09/08/240609 WBC 16.4* 8.9 8.2 HGB 13.7 11.2* 10.8* HCT 43.60 36.20 34.50* PLT 409 297 290 Recent Labs Lab 09/06/240927 09/06/241017 09/06/241721 09/07/240608 09/07/240639 09/07/242340 09/08/240539 09/08/240609 NA 134*  --   --  139  --   --   --  138 K  --  5.4*  --  4.5  --   --   --  4.2 CL 97*  --   --  106  --   --   --  106 CO2 24  --   --  24  --   --   --  24 BUN 19  --   --  14  --   --   --  10 CREATININE 0.60  --   --  0.80  --   --   --  0.70 GLU 223*  --    < > 106*   < > 86 90 87 CALCIUM 9.5  --   --  8.2*  --   --   --  8.6* MG  --   --   --  1.9  --   --   --  1.8 PHOS  --   --   --  2.6  --   --   --  2.8  < > = values in this interval not displayed. Recent Labs Lab 09/06/240927 ALT 18 ALKPHOS 148* BILITOT 0.3 BILIDIR <0.2 LIPASE 24 Recent Labs Lab 09/06/241347 09/07/240608 09/08/240609 PTT 60.4*  --   --  INR 3.79* 3.19* 2.23* ImagingNo results found.ASSESSMENT AND PLAN Maria [name] is a 72 y.o. 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 With bowel rest, pain improving, tolerated CLD yesterday. Will advance to low fiber diet today. Plan: - Advance to low fiber diet- Patient counseled to take it slow, stop if nausea returns - Now that taking food, adjusted pain medications from dilaudid to toradol. Patient reports she can tolerate the medication and it works well for her in the past. - Discontinue IVFDiet: low fiberIVF: nonePain control: tylenol, toradolAbx: noneMonitor I/OsOOB/AmbulateEncourage IS useDVT ppx: none (home Warfarin held, most recent INR 3)Dispo planning: pending clinical course, potential discharge home tomorrowCode: Full interventionsSigned: Gertie Baron, MD, MSPGY-1Available on MHB09/08/247:23 AMATTENDING ADDENDUM

## 2023-01-03 NOTE — Plan of Care
Plan of Care Overview/ Patient Status    A&Ox4. BG Q6hr. IVF infusing NS @ 75cc/hr. Tolerating clears in small amounts, denies n/v overnight. C/o abd pain managed with PRN dilaudid. 1x dose of benadryl PO given for c/o itchiness s/p dilaudid administration with + effect, covering pro Dr. Eulis Canner aware. Ambulating, voiding OOB x 1 x IV pole up to toilet. B/l v-boots while in bed. Safety maintained - bed in lowest position, call bell in reach. Meds & POC explained - pt verbalizes understanding. Electronically Signed by Arnell Asal, RN, January 03, 2023

## 2023-01-03 NOTE — Plan of Care
Problem: Adult Inpatient Plan of CareGoal: Plan of Care ReviewOutcome: Interventions implemented as appropriateFlowsheets (Taken 01/02/2023 1231)Plan of Care Reviewed With: patient Plan of Care Overview/ Patient Status    Patient teaching provided regarding activity level, medication regime and diet advancement. Patients diet advanced to low fiber, instructed to take a small amount but developed some abdominal pain. Dr. Mayford Knife aware. Increase activity encouraged, right knee pain persists from previous surgery. Passing flatus , no BM yet. Questions encouraged. Maria Patience, RN

## 2023-01-04 ENCOUNTER — Inpatient Hospital Stay: Admit: 2023-01-04 | Payer: PRIVATE HEALTH INSURANCE

## 2023-01-04 DIAGNOSIS — K56609 Unspecified intestinal obstruction, unspecified as to partial versus complete obstruction: Secondary | ICD-10-CM

## 2023-01-04 LAB — CBC WITH AUTO DIFFERENTIAL
BKR WAM ABSOLUTE IMMATURE GRANULOCYTES.: 0.01 x 1000/ÂµL (ref 0.00–0.30)
BKR WAM ABSOLUTE LYMPHOCYTE COUNT.: 1.46 x 1000/ÂµL (ref 0.60–3.70)
BKR WAM ABSOLUTE NRBC (2 DEC): 0 x 1000/ÂµL (ref 0.00–1.00)
BKR WAM ANC (ABSOLUTE NEUTROPHIL COUNT): 5.64 x 1000/ÂµL (ref 2.00–7.60)
BKR WAM BASOPHIL ABSOLUTE COUNT.: 0.02 x 1000/ÂµL (ref 0.00–1.00)
BKR WAM BASOPHILS: 0.3 % (ref 0.0–1.4)
BKR WAM EOSINOPHIL ABSOLUTE COUNT.: 0.21 x 1000/ÂµL (ref 0.00–1.00)
BKR WAM EOSINOPHILS: 2.7 % (ref 0.0–5.0)
BKR WAM HEMATOCRIT (2 DEC): 35.2 % (ref 35.00–45.00)
BKR WAM HEMOGLOBIN: 11.3 g/dL — ABNORMAL LOW (ref 11.7–15.5)
BKR WAM IMMATURE GRANULOCYTES: 0.1 % (ref 0.0–1.0)
BKR WAM LYMPHOCYTES: 18.5 % (ref 17.0–50.0)
BKR WAM MCH (PG): 26.2 pg — ABNORMAL LOW (ref 27.0–33.0)
BKR WAM MCHC: 32.1 g/dL (ref 31.0–36.0)
BKR WAM MCV: 81.5 fL (ref 80.0–100.0)
BKR WAM MONOCYTE ABSOLUTE COUNT.: 0.56 x 1000/ÂµL (ref 0.00–1.00)
BKR WAM MONOCYTES: 7.1 % (ref 4.0–12.0)
BKR WAM MPV: 10.8 fL (ref 8.0–12.0)
BKR WAM NEUTROPHILS: 71.3 % (ref 39.0–72.0)
BKR WAM NUCLEATED RED BLOOD CELLS: 0 % (ref 0.0–1.0)
BKR WAM PLATELETS: 302 x1000/ÂµL (ref 150–420)
BKR WAM RDW-CV: 14.9 % (ref 11.0–15.0)
BKR WAM RED BLOOD CELL COUNT.: 4.32 M/ÂµL (ref 4.00–6.00)
BKR WAM WHITE BLOOD CELL COUNT: 7.9 x1000/ÂµL (ref 4.0–11.0)

## 2023-01-04 LAB — MAGNESIUM: BKR MAGNESIUM: 1.8 mg/dL (ref 1.7–2.4)

## 2023-01-04 LAB — BASIC METABOLIC PANEL
BKR ANION GAP: 11 (ref 7–17)
BKR BLOOD UREA NITROGEN: 17 mg/dL (ref 8–23)
BKR BUN / CREAT RATIO: 21.3 (ref 8.0–23.0)
BKR CALCIUM: 9 mg/dL (ref 8.8–10.2)
BKR CHLORIDE: 103 mmol/L (ref 98–107)
BKR CO2: 26 mmol/L (ref 20–30)
BKR CREATININE: 0.8 mg/dL (ref 0.40–1.30)
BKR EGFR, CREATININE (CKD-EPI 2021): >60 mL/min/{1.73_m2} (ref >=60)
BKR GLUCOSE: 124 mg/dL — ABNORMAL HIGH (ref 70–100)
BKR POTASSIUM: 4.2 mmol/L (ref 3.3–5.3)
BKR SODIUM: 140 mmol/L (ref 136–144)

## 2023-01-04 LAB — PHOSPHORUS     (BH GH L LMW YH): BKR PHOSPHORUS: 2.8 mg/dL (ref 2.2–4.5)

## 2023-01-04 LAB — PROTIME AND INR
BKR INR: 1.54 — ABNORMAL HIGH (ref 0.88–1.14)
BKR PROTHROMBIN TIME: 16 s — ABNORMAL HIGH (ref 9.5–12.1)

## 2023-01-04 MED ORDER — GABAPENTIN 300 MG CAPSULE
300 | Freq: Every evening | ORAL | Status: DC
Start: 2023-01-04 — End: 2023-01-05
  Administered 2023-01-05: 300 mg via ORAL

## 2023-01-04 MED ORDER — BISACODYL 10 MG RECTAL SUPPOSITORY
10 | Freq: Once | RECTAL | Status: CP
Start: 2023-01-04 — End: ?
  Administered 2023-01-04: 14:00:00 10 mg via RECTAL

## 2023-01-04 MED ORDER — IOHEXOL 350 MG IODINE/ML ORAL
350 | Freq: Once | ORAL | Status: DC
Start: 2023-01-04 — End: 2023-01-04

## 2023-01-04 MED ORDER — WARFARIN THERAPY PLACEHOLDER
ORAL | Status: DC
Start: 2023-01-04 — End: 2023-01-05

## 2023-01-04 MED ORDER — POLYETHYLENE GLYCOL 3350 17 GRAM ORAL POWDER PACKET
17 | Freq: Every day | ORAL | Status: DC
Start: 2023-01-04 — End: 2023-01-05
  Administered 2023-01-04 – 2023-01-05 (×2): 17 gram via ORAL

## 2023-01-04 MED ORDER — MULTIVITAMIN-IRON 9 MG-FOLIC ACID 400 MCG-CALCIUM AND MINERALS TABLET
9 | Freq: Every day | ORAL | Status: DC
Start: 2023-01-04 — End: 2023-01-05
  Administered 2023-01-05: 12:00:00 9 mg iron-400 mcg via ORAL

## 2023-01-04 MED ORDER — ACETAMINOPHEN 325 MG TABLET
325 | Freq: Four times a day (QID) | ORAL | Status: DC | PRN
Start: 2023-01-04 — End: 2023-01-05

## 2023-01-04 MED ORDER — WARFARIN 5 MG TABLET
5 | Freq: Once | ORAL | Status: CP
Start: 2023-01-04 — End: ?
  Administered 2023-01-04: 22:00:00 5 mg via ORAL

## 2023-01-04 NOTE — Plan of Care
Problem: Adult Inpatient Plan of CareGoal: Plan of Care ReviewOutcome: Interventions implemented as appropriate Plan of Care Overview/ Patient Status    [id number] alert and oriented x4. Calm and cooperative in care. Patient tolerating diet well, no complaints of n/v or abd pain. Patient OOB independently. Medicated per MAR. No additional complaints at the moment. Safety maintained, hourly rounding done, bed in lowest position, call light in reach, will continue to monitor. Electronically Signed by Scarlett Presto, RN, January 04, 2023 3:32 AM

## 2023-01-04 NOTE — Plan of Care
Problem: Adult Inpatient Plan of CareGoal: Plan of Care ReviewOutcome: Interventions implemented as appropriateGoal: Patient-Specific Goal (Individualized)Outcome: Interventions implemented as appropriateGoal: Absence of Hospital-Acquired Illness or InjuryOutcome: Interventions implemented as appropriateGoal: Optimal Comfort and WellbeingOutcome: Interventions implemented as appropriateGoal: Readiness for Transition of CareOutcome: Interventions implemented as appropriate Plan of Care Overview/ Patient Status    Alert and oriented. Understands [place]. Denies abdominal pain/nausea. Tolerated breakfast. Had small hard BM this morning. Mirlax and Dulcolax PR given, had another small hard BM. Patient frustrated it was not more. Voiding. OOB ambulating independently in hallway. All meds/POC. Possible d/c later this afternoon or tomorrow.

## 2023-01-04 NOTE — Discharge Instructions
Patient Education Small Bowel Obstruction Discharge Instructions About this topic A small bowel obstruction is when the small bowel is blocked. Doctors may order tests to diagnose a blocked small bowel. The small bowel may be partly blocked or totally blocked. This obstruction may cause a buildup of stool and other digested contents inside the bowel. It may cause serious problems if not treated right away. What care is needed at home? Ask your doctor what you need to do when you go home. Make sure you ask questions if you do not understand what the doctor says. This way you will know what you need to do.Take your drugs as ordered by your doctor.What drugs may be needed? The doctor may order drugs ZO:XWRU with painKeep your stool softWill physical activity be limited? Physical activities may be limited if you are suffering from pain. Talk to your doctor about the right amount of activity for you. If your doctor says it is okay, take short walks every day to help prevent blood clots.What changes to diet are needed? Ask your doctor or dietitian for a diet plan. Your doctor may suggest a liquid or soft diet until your bowel is stable and ready for regular food. Drink lots of clear liquids. Clear liquids include water, fruit juices, soup broth, gelatin, popsicles that do not have fruit or fruit pulp, tea or coffee with no added milk, and sports drinks. Avoid beer, wine, and mixed drinks (alcohol) and limit caffeine.What problems could happen? Hard stoolsDehydrationInfectionTear or hole in the small bowelTissue deathBowel function does not return to normalWhen do I need to call the doctor? Signs of infection. These include a fever of 100.4?F (38?C) or higher, chills, pain with passing urine or not able to pass urine, wound that will not heal.Unable to pass stool or gasVery upset stomach or throwing upVery bad belly painTeach Back: Helping You Understand The Teach Back Method helps you understand the information we are giving you. After you talk with the staff, tell them in your own words what you learned. This helps to make sure the staff has described each thing clearly. It also helps to explain things that may have been confusing. Before going home, make sure you can do these:I can tell you about my condition.I can tell you how to care for my cut site, if I have one.I can tell you what I will do if I have an upset stomach, throwing up, or am not able to pass stool or gas.Where can I learn more? [employer] Last Reviewed Date 2021-05-19Consumer Information Use and Disclaimer This generalized information is a limited summary of diagnosis, treatment, and/or medication information. It is not meant to be comprehensive and should be used as a tool to help the user understand and/or assess potential diagnostic and treatment options. It does NOT include all information about conditions, treatments, medications, side effects, or risks that may apply to a specific patient. It is not intended to be medical advice or a substitute for the medical advice, diagnosis, or treatment of a health care provider based on the health care provider's examination and assessment of a patient?s specific and unique circumstances. Patients must speak with a health care provider for complete information about their health, medical questions, and treatment options, including any risks or benefits regarding use of medications. This information does not endorse any treatments or medications as safe, effective, or approved for treating a specific patient. [employer] and its affiliates disclaim any warranty or liability relating to this information or  the use thereof. The use of this information is governed by the Terms of Use, available at https://www.wolterskluwer.com/en/know/clinical-effectiveness-terms Copyright Copyright ? 2023 [employer], Inc[employer] and its affiliates and/or licensors. All rights reserved.

## 2023-01-04 NOTE — Progress Notes
[facility]	 [facility]	SURGERY PROGRESS NOTEINTERIM EVENTS - NAEO- Tolerating low fiber diet- Some abdominal pain yesterday morning with minimal nausea. No vomiting.- Small, hard BM- Passing gas - Pain improving - Afebrile, HDSOBJECTIVE Physical ExamGeneral: Awake, alert, in NAD.Chest: Symmetric chest expansion, normal work of breathing.Heart: Regular rate, hemodynamically stable.Abdomen: Soft, nontender to palpation, nondistended, no rebound or guarding.Extremities: Warm and well perfused.Neuro: Spontaneously moving all four extremities.VitalsTemp:  [98 ?F (36.7 ?C)-98.4 ?F (36.9 ?C)] 98.1 ?F (36.7 ?C)Pulse:  [74-87] 75Resp:  [16-20] 20BP: (133-164)/(72-87) 133/77SpO2:  [98 %-100 %] 98 %Device (Oxygen Therapy): room airLabsRecent Labs Lab 09/06/240927 09/07/240608 09/08/240609 WBC 16.4* 8.9 8.2 HGB 13.7 11.2* 10.8* HCT 43.60 36.20 34.50* PLT 409 297 290 Recent Labs Lab 09/06/240927 09/06/241017 09/06/241721 09/07/240608 09/07/240639 09/08/240609 09/08/241126 09/08/241705 09/08/242035 NA 134*  --   --  139  --  138  --   --   --  K  --  5.4*  --  4.5  --  4.2  --   --   --  CL 97*  --   --  106  --  106  --   --   --  CO2 24  --   --  24  --  24  --   --   --  BUN 19  --   --  14  --  10  --   --   --  CREATININE 0.60  --   --  0.80  --  0.70  --   --   --  GLU 223*  --    < > 106*   < > 87 190* 166* 190* CALCIUM 9.5  --   --  8.2*  --  8.6*  --   --   --  MG  --   --   --  1.9  --  1.8  --   --   --  PHOS  --   --   --  2.6  --  2.8  --   --   --   < > = values in this interval not displayed. Recent Labs Lab 09/06/240927 ALT 18 ALKPHOS 148* BILITOT 0.3 BILIDIR <0.2 LIPASE 24 Recent Labs Lab 09/06/241347 09/07/240608 09/08/240609 09/09/240445 PTT 60.4*  --   --   --  INR 3.79* 3.19* 2.23* 1.54* ImagingNo results found.ASSESSMENT AND PLAN Lucielle Ito is a 72 y.o.  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 With bowel rest, pain improving, tolerated low fiber diet yesterday. Potential discharge home today. Plan:- Give miralax and dulcolax. Continue to monitor abdominal pain and for bowel movements.- Concern for inadequate warfarin GI absorption, placed on therapeutic lovenox for now, will bridge to home warfarin later. - If she has a bowel movement, consider discharge home, potentially today Diet: low fiberIVF: nonePain control: tylenol, toradolAbx: noneMonitor I/OsOOB/AmbulateEncourage IS useDVT ppx: therapeutic lovenox (home Warfarin held due to absorption control)Dispo planning: pending clinical course, potential discharge home tomorrowCode: Full interventionsSigned: Gertie Baron, MD, MSPGY-1Available on MHB09/09/246:27 AMATTENDING ADDENDUM

## 2023-01-05 ENCOUNTER — Ambulatory Visit: Admit: 2023-01-05 | Payer: PRIVATE HEALTH INSURANCE | Primary: Internal Medicine

## 2023-01-05 ENCOUNTER — Encounter: Admit: 2023-01-05 | Payer: PRIVATE HEALTH INSURANCE | Primary: Internal Medicine

## 2023-01-05 DIAGNOSIS — Z885 Allergy status to narcotic agent status: Secondary | ICD-10-CM

## 2023-01-05 DIAGNOSIS — D6862 Lupus anticoagulant syndrome: Secondary | ICD-10-CM

## 2023-01-05 DIAGNOSIS — D72829 Elevated white blood cell count, unspecified: Secondary | ICD-10-CM

## 2023-01-05 DIAGNOSIS — E119 Type 2 diabetes mellitus without complications: Secondary | ICD-10-CM

## 2023-01-05 DIAGNOSIS — E669 Obesity, unspecified: Secondary | ICD-10-CM

## 2023-01-05 DIAGNOSIS — E7800 Pure hypercholesterolemia, unspecified: Secondary | ICD-10-CM

## 2023-01-05 DIAGNOSIS — Z79899 Other long term (current) drug therapy: Secondary | ICD-10-CM

## 2023-01-05 DIAGNOSIS — E871 Hypo-osmolality and hyponatremia: Secondary | ICD-10-CM

## 2023-01-05 DIAGNOSIS — Z794 Long term (current) use of insulin: Secondary | ICD-10-CM

## 2023-01-05 DIAGNOSIS — Z6833 Body mass index (BMI) 33.0-33.9, adult: Secondary | ICD-10-CM

## 2023-01-05 DIAGNOSIS — L719 Rosacea, unspecified: Secondary | ICD-10-CM

## 2023-01-05 DIAGNOSIS — Z7901 Long term (current) use of anticoagulants: Secondary | ICD-10-CM

## 2023-01-05 DIAGNOSIS — E875 Hyperkalemia: Secondary | ICD-10-CM

## 2023-01-05 DIAGNOSIS — K436 Other and unspecified ventral hernia with obstruction, without gangrene: Secondary | ICD-10-CM

## 2023-01-05 DIAGNOSIS — F32A Depression: Secondary | ICD-10-CM

## 2023-01-05 DIAGNOSIS — Z91040 Latex allergy status: Secondary | ICD-10-CM

## 2023-01-05 DIAGNOSIS — E1165 Type 2 diabetes mellitus with hyperglycemia: Secondary | ICD-10-CM

## 2023-01-05 DIAGNOSIS — Z933 Colostomy status: Secondary | ICD-10-CM

## 2023-01-05 DIAGNOSIS — Z7984 Long term (current) use of oral hypoglycemic drugs: Secondary | ICD-10-CM

## 2023-01-05 DIAGNOSIS — Z881 Allergy status to other antibiotic agents status: Secondary | ICD-10-CM

## 2023-01-05 DIAGNOSIS — R42 Dizziness and giddiness: Secondary | ICD-10-CM

## 2023-01-05 DIAGNOSIS — K559 Vascular disorder of intestine, unspecified: Secondary | ICD-10-CM

## 2023-01-05 DIAGNOSIS — K55039 Acute (reversible) ischemia of large intestine, extent unspecified: Secondary | ICD-10-CM

## 2023-01-05 LAB — BASIC METABOLIC PANEL
BKR ANION GAP: 7 (ref 7–17)
BKR BLOOD UREA NITROGEN: 17 mg/dL (ref 8–23)
BKR BUN / CREAT RATIO: 24.3 — ABNORMAL HIGH (ref 8.0–23.0)
BKR CALCIUM: 9.3 mg/dL (ref 8.8–10.2)
BKR CHLORIDE: 104 mmol/L (ref 98–107)
BKR CO2: 28 mmol/L (ref 20–30)
BKR CREATININE: 0.7 mg/dL (ref 0.40–1.30)
BKR EGFR, CREATININE (CKD-EPI 2021): >60 mL/min/{1.73_m2} (ref >=60)
BKR GLUCOSE: 126 mg/dL — ABNORMAL HIGH (ref 70–100)
BKR POTASSIUM: 4.4 mmol/L (ref 3.3–5.3)
BKR SODIUM: 139 mmol/L (ref 136–144)

## 2023-01-05 LAB — CBC WITH AUTO DIFFERENTIAL
BKR WAM ABSOLUTE IMMATURE GRANULOCYTES.: 0.02 x 1000/ÂµL (ref 0.00–0.30)
BKR WAM ABSOLUTE LYMPHOCYTE COUNT.: 1.62 x 1000/ÂµL (ref 0.60–3.70)
BKR WAM ABSOLUTE NRBC (2 DEC): 0 x 1000/ÂµL (ref 0.00–1.00)
BKR WAM ANC (ABSOLUTE NEUTROPHIL COUNT): 5.64 x 1000/ÂµL (ref 2.00–7.60)
BKR WAM BASOPHIL ABSOLUTE COUNT.: 0.03 x 1000/ÂµL (ref 0.00–1.00)
BKR WAM BASOPHILS: 0.4 % (ref 0.0–1.4)
BKR WAM EOSINOPHIL ABSOLUTE COUNT.: 0.22 x 1000/ÂµL (ref 0.00–1.00)
BKR WAM EOSINOPHILS: 2.7 % (ref 0.0–5.0)
BKR WAM HEMATOCRIT (2 DEC): 37 % (ref 35.00–45.00)
BKR WAM HEMOGLOBIN: 11.8 g/dL (ref 11.7–15.5)
BKR WAM IMMATURE GRANULOCYTES: 0.2 % (ref 0.0–1.0)
BKR WAM LYMPHOCYTES: 20 % (ref 17.0–50.0)
BKR WAM MCH (PG): 26.1 pg — ABNORMAL LOW (ref 27.0–33.0)
BKR WAM MCHC: 31.9 g/dL (ref 31.0–36.0)
BKR WAM MCV: 81.9 fL (ref 80.0–100.0)
BKR WAM MONOCYTE ABSOLUTE COUNT.: 0.59 x 1000/ÂµL (ref 0.00–1.00)
BKR WAM MONOCYTES: 7.3 % (ref 4.0–12.0)
BKR WAM MPV: 10.6 fL (ref 8.0–12.0)
BKR WAM NEUTROPHILS: 69.4 % (ref 39.0–72.0)
BKR WAM NUCLEATED RED BLOOD CELLS: 0 % (ref 0.0–1.0)
BKR WAM PLATELETS: 342 x1000/ÂµL (ref 150–420)
BKR WAM RDW-CV: 15.2 % — ABNORMAL HIGH (ref 11.0–15.0)
BKR WAM RED BLOOD CELL COUNT.: 4.52 M/ÂµL (ref 4.00–6.00)
BKR WAM WHITE BLOOD CELL COUNT: 8.1 x1000/ÂµL (ref 4.0–11.0)

## 2023-01-05 LAB — MAGNESIUM: BKR MAGNESIUM: 1.7 mg/dL (ref 1.7–2.4)

## 2023-01-05 LAB — PROTIME AND INR
BKR INR: 1.2 — ABNORMAL HIGH (ref 0.88–1.14)
BKR PROTHROMBIN TIME: 12.7 s — ABNORMAL HIGH (ref 9.5–12.1)

## 2023-01-05 LAB — PHOSPHORUS     (BH GH L LMW YH): BKR PHOSPHORUS: 2.8 mg/dL (ref 2.2–4.5)

## 2023-01-05 MED ORDER — POLYETHYLENE GLYCOL 3350 17 GRAM ORAL POWDER PACKET
17 | Freq: Every day | ORAL | 3 refills | Status: AC
Start: 2023-01-05 — End: ?

## 2023-01-05 NOTE — Plan of Care
Problem: Adult Inpatient Plan of CareGoal: Plan of Care ReviewOutcome: Interventions implemented as appropriate Plan of Care Overview/ Patient Status    Pt is AOX4, calm cooperative and very receptive to care. POC reviewed.OOB indepedendently. Denies pain at this time but mentions discomfort of gas in upper abdomen. VSS on RA. Tolerating diet. Denies N/V/D. Voiding spontaneously.Safety maintained; call bell within reach, bed in lowest position, room near the nurses station, questions and concerns addressed.

## 2023-01-05 NOTE — Progress Notes
[facility]	 [facility]	SURGERY PROGRESS NOTEINTERIM EVENTS - NAEO- Tolerating low fiber diet- Small, hard BM- Passing gas - Pain improving - Afebrile, HDSOBJECTIVE Physical ExamGeneral: Awake, alert, in NAD.Chest: Symmetric chest expansion, normal work of breathing.Heart: Regular rate, hemodynamically stable.Abdomen: Soft, nontender to palpation, nondistended, no rebound or guarding.Extremities: Warm and well perfused.Neuro: Spontaneously moving all four extremities.VitalsTemp:  [98 ?F (36.7 ?C)-98.7 ?F (37.1 ?C)] 98.3 ?F (36.8 ?C)Pulse:  [74-78] 77Resp:  [18-20] 18BP: (135-148)/(64-81) 135/64SpO2:  [95 %-97 %] 97 %Device (Oxygen Therapy): room airLabsRecent Labs Lab 09/08/240609 09/09/240652 09/10/240647 WBC 8.2 7.9 8.1 HGB 10.8* 11.3* 11.8 HCT 34.50* 35.20 37.00 PLT 290 302 342 Recent Labs Lab 09/07/240608 09/07/240639 09/08/240609 09/08/241126 09/09/240652 09/09/240832 09/09/241144 09/09/241657 09/09/242021 NA 139  --  138  --  140  --   --   --   --  K 4.5  --  4.2  --  4.2  --   --   --   --  CL 106  --  106  --  103  --   --   --   --  CO2 24  --  24  --  26  --   --   --   --  BUN 14  --  10  --  17  --   --   --   --  CREATININE 0.80  --  0.70  --  0.80  --   --   --   --  GLU 106*   < > 87   < > 124*   < > 146* 187* 129* CALCIUM 8.2*  --  8.6*  --  9.0  --   --   --   --  MG 1.9  --  1.8  --  1.8  --   --   --   --  PHOS 2.6  --  2.8  --  2.8  --   --   --   --   < > = values in this interval not displayed. Recent Labs Lab 09/06/240927 ALT 18 ALKPHOS 148* BILITOT 0.3 BILIDIR <0.2 LIPASE 24 Recent Labs Lab 09/06/241347 09/07/240608 09/08/240609 09/09/240445 PTT 60.4*  --   --   --  INR 3.79* 3.19* 2.23* 1.54* ImagingNo results found.ASSESSMENT AND PLAN Maria [name] is a 72 y.o. 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 With bowel rest, pain improving, tolerated low fiber diet yesterday. Potential discharge home today. Plan:- Give miralax and dulcolax. Continue to monitor abdominal pain and for bowel movements.- On therapeutic lovenox for now, will bridge to home warfarin later. - If she has a bowel movement, consider discharge home, potentially today Diet: low fiberIVF: nonePain control: tylenol, toradolAbx: noneMonitor I/OsOOB/AmbulateEncourage IS useDVT ppx: therapeutic lovenox (home Warfarin held due to absorption control)Dispo planning: pending clinical course, potential discharge home todayCode: Full interventionsSigned: Corena Pilgrim MDAnesthesiology PGY1Available on MHB09/10/246:27 AMATTENDING ADDENDUM

## 2023-01-05 NOTE — Progress Notes
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  The patient was contacted regarding lab drawn INR results.  Patient's INR findings and prescribed dose from last visit:  12/21/2022   5:19 PM 12/30/2022   4:34 PM 01/05/2023   3:34 PM [facility] INR Date 12/21/2022 12/30/2022 01/05/2023 Associated INR 2.21 3.29 1.20 Instructions 7.5 mg every Tue; 5 mg all other days 5 mg every day 9/10: 7.5 mg; 9/11: 7.5 mg; Otherwise 5 mg every day Sunday Dose 5 mg 5 mg - Monday Dose 5 mg 5 mg - Tuesday Dose 7.5 mg - 7.5 mg (9/10) Wednesday Dose 5 mg 5 mg 7.5 mg (9/11) Thursday Dose 5 mg 5 mg - Friday Dose 5 mg 5 mg - Saturday Dose 5 mg 5 mg - Last 7 day dose total 50 mg 35 mg 35 mg Next INR Date 12/28/2022 01/05/2023 01/07/2023 The following findings were reported from today's call (no findings if none selected):[]  Missed doses []  Extra doses []  Change in medications []  Change in vitamin k rich food intake (green vegetables, herbals, etc) []  Change in alcohol use []  Change in overall health [x]  Recent hospitalization / emergency department visit []  Upcoming procedure (including dental procedures) []  Other Comments:       Patient was admitted for small bowel obstruction The following side effects were reported from today's call (no side effects if none selected):[]  Bleeding []  New bruising []  Warfarin-emergency department visit or hospital admission []  Other Comments:   No side effects Assessment/Plan:Patient's INR is 1.2, which is below goal range of 2.0-3.0.  Patient was admitted for small bowel obstruction and was sent home bridging with Lovenox.  She was instructed to  continue Lovenox and take warfarin 7.5 mg 9/10 and 9/11  as indicated in the maintenance plan below.  Next INR assessment due  01/07/23 .  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  Electronically Signed by [name], RPH, January 05, 2023 [facility]  [address] [place], [place] [address]  Phone: [phone]  Fax: [phone]

## 2023-01-05 NOTE — Plan of Care
Problem: Adult Inpatient Plan of CareGoal: Plan of Care ReviewOutcome: Outcome(s) achieved Plan of Care Overview/ Patient Status    Patient DC home after treatment for a bowel obstruction. Patient tolerating a diet without GI distress, passing flatus. Patient OOB ambulating with no complaints of pain. Patient provided with written and verbal DC instructions. Rise Patience, RN

## 2023-01-05 NOTE — Discharge Summary
[facility] Hospital-SrcMed/Surg Discharge SummaryPatient Data:  Patient Name: Maria [name] Admit date: 01/01/2023 Age: 72 y.o. Discharge date: 01/05/23 DOB: April 08, 1951	 Discharge Attending Physician: Hortencia Conradi, MD  MRN: [id number]	 Discharged Condition: good PCP: Cena Benton, MD  Disposition: Home  Principal Diagnosis: Recurrent small bowel obstructionComorbidities Comorbidities present on admission:Class 1 obesity noted, with BMI (Calculated): 33.2.  Coagulopathic due to prescription of anticoagulant medication prior to admission.Diabetes noted on admission, last Hgb A1c: 7.7%%. Secondary diagnoses occurring during hospitalization:HyperkalemiaHypocalcemiaHyponatremiaDiabetes with hyperglycemia  Post Discharge Follow Up Items: Issues to be Addressed Post Discharge:Follow up as neededContinue home warfarin with Lovenox bridge until INR therapeutic INR check on Thursday 9/12Pending Labs and Tests: Pending Lab Results   Order Current Status  Basic metabolic panel In process  Magnesium In process  Phosphorus In process  Protime and INR In process  Follow-up Information:[facility] and [address] 2nd FloorSuite [facility] [phone] [place], [place] [address] [place] [place] [phone] to schedule follow up if preferenceMartin, Gery Pray, MD[address] AOr[address] [place] [phone] up on 9/12/2024Follow up at scheduled appointment at 11:20 AM Future Appointments Date Time Provider Department Center 01/07/2023 11:30 AM [facility] DRAW STATION [place] [facility] DS [facility] LAB 01/07/2023 11:40 AM Harlow Mares, MD SMIL [place] [facility] CAD 01/29/2023 11:00 AM Nepomuceno, Rande Lawman, APRN Endo None 04/08/2023  8:00 AM Lillia Abed, MD DIG HLTH [facility] BCD - DD 11/02/2023 10:30 AM Hollice Espy, MD [facility] EYE OPH COMP Hospital Course: Maria [name] is a 72 y.o. 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), extensive surgical history including exploratory laparotomy with extended left colectomy and creation of colostomy [name] 04/2013), Stage 2 Hatman's procedure with extensive LOA [name] 01/2014), exploratory laparotomy, LOA and ventral hernia repair with mesh [name] 07/2014), exploratory laparotomy with extensive LOA and enterotomy repair [name] 11/2015), ventral hernia repair with mesh [name] 01/2020), with multiple recurrent SBO, last admission being on 08/2022, treated non operatively who presented to [facility] ED with abdominal pain associated with nausea and several episodes of emesis that is similar in nature to previous episodes.In ED patient was afebrile and hemodynamically stable. Labs were notable for a leukocytosis to 16.4. Imaging consisting of [place] A/P concerning for recurrent small bowel obstruction with possible transition point in the left lower abdomen. Patient was admitted to the surgical service for conservative management of recurrent small bowel obstruction. Patient the following day reported improvement in abdominal pain and large amount of flatus. She was initiated on clear liquid diet and monitored for tolerance. Her diet was advanced to regular and with continued evidence of bowel function patient was discharged home with instructions to follow up as needed. She was instructed to remain on Lovenox bridge with her home regimen of Warfarin until her INR is therapeutic with an INR check on 01/07/23.Inpatient Consultants and summary of recommendations:NoneSurgeries:NoneData: Imaging: [place] ABDOMEN PELVIS W IV CONTRASTHISTORY: Bowel obstruction suspected COMPARISON: [place] ABDOMEN PELVIS W IV CONTRAST 2024-05-16TECHNIQUE: [place] images of the abdomen and pelvis were obtained from the diaphragms to the pubic symphysis after the administration of intravenous contrast.   IV CONTRAST:  80 ML IOHEXOL (OMNIPAQUE) 350 MG IODINE/ML INJECTION FINDINGS:LUNG BASES: Mild dependent atelectasis. 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 LIVER: Stable right hepatic hypodensity too small to characterize.GALLBLADDER: Unremarkable.SPLEEN: Unremarkable.PANCREAS: Unremarkable.ADRENALS: Unremarkable.KIDNEYS: Unremarkable. BOWEL: Status post right hemicolectomy. Redemonstration of acute high-grade small bowel obstruction with a transition point in the left lower abdomen (series 4 image  491) where small bowel loops are adherent to the anterior abdominal wall likely due to adhesions. Small bowel loops proximal to the transition point of equalized. Proximal small bowel loops are dilated to 4.7 cm. No evidence of perforation or drainable collection. Moderate stool burden in the colon. PERITONEUM: Unremarkable. LYMPH NODES: Unremarkable.VESSELS: Atherosclerotic vascular calcifications. URINARY BLADDER: Unremarkable.PELVIS: No mass.  BONES & SOFT TISSUE: No acute or aggressive osseous lesions. Evidence of prior ventral hernia mesh repair. There is a lower abdominal wall hernia containing fat and nonobstructed loop of bowel. Small nonspecific 11 mm nodule in the subcutaneous left anterior abdominal wall to the left of the umbilicus, new since May 2024. Correlate with physical examination. IMPRESSION: 1. Acute recurrent high-grade SMALL BOWEL OBSTRUCTION with transition in the left lower abdomen. No perforation or drainable collection. Surgical consult recommended.2. Small 1 cm nodule in the subcutaneous fat of the left lower abdominal wall. Correlate with physical examination.  [place] [facility] Notify System Classification: Urgent result. Report initiated by:  Domenick Bookbinder, MD, MS Reported and signed by: Etheleen Nicks, MD  [facility] Radiology and Biomedical ImagingDiet:  Diet Low FiberMobility: Highest Level of mobility - ACTUAL: Mobility Level 8, Walk 250+ feet AM PAC 24Physical Therapy Disposition Recommendation: HomeAdditional Physical Therapy Disposition Recommendations: Outpatient Physical TherapyPhysical Exam Discharge vital signs: Vitals:  01/05/23 0617 BP: 135/64 Pulse: 77 Resp: 18 Temp: 98.3 ?F (36.8 ?C) Cognitive Status at Discharge: BaselinePhysical ExamGeneral: Awake, alert, in NAD.Chest: Symmetric chest expansion, normal work of breathing.Heart: Regular rate, hemodynamically stable.Abdomen: Soft, nontender to palpation, nondistended, no rebound or guardingHistory  Allergies Allergies Allergen Reactions  Morphine Hives and Itching   Pt tolerated morphine during admission 08/08/16-08/09/16.  Pt premedicated with diphenhydramine  Biaxin  [Clarithromycin]   Latex, Natural Rubber Rash  Mitosol [Mitomycin] Rash  Oxycodone Rash  PMH PSH Past Medical History: Diagnosis Date  Acute ischemic colitis (HC Code) (HC CODE) (HC Code) 2015  Depression   Diabetes mellitus (HC Code)   Hypercholesteremia   Lupus anticoagulant disorder (HC Code)   Rosacea   Ventral hernia with bowel obstruction   Vertigo   Past Surgical History: Procedure Laterality Date  ABDOMINAL ADHESION SURGERY  2016  BLEPHAROPLASTY Bilateral 05/2016  COLOSTOMY  05/27/13  COLOSTOMY CLOSURE    HYSTEROSCOPY W/ POLYPECTOMY  06/18/2020  KNEE LIGAMENT RECONSTRUCTION Right 2004  LAPAROSCOPIC NISSEN FUNDOPLICATION    2000 ?  SINUS SURGERY    THYROID BIOPSY    TUBAL LIGATION    UMBILICAL HERNIA REPAIR    VENTRAL HERNIA REPAIR    Social History Family History Social History Tobacco Use  Smoking status: Never  Smokeless tobacco: Never Substance Use Topics  Alcohol use: Not Currently   Alcohol/week: 1.0 standard drink of alcohol   Types: 1 Glasses of wine per week   Comment: 1-2/year  Family History Problem Relation Age of Onset  Bladder cancer Mother   Colon cancer Father   Diabetes Father   Diabetes Sister   Colon cancer Paternal Aunt   Colon cancer Maternal Cousin       died of colona cancer at age 63  Breast cancer Maternal Cousin       2 cousins with breast cancer  Diabetes Paternal Uncle     Discharge Medications  Discharge: Current Discharge Medication List  START taking these medications  Details polyethylene glycol (MIRALAX) 17 gram packet Take 1 packet (17 g total) by mouth daily. Mix in 8 ounces of water, juice, soda, coffee or tea prior to taking.Qty: 14 each, Refills:  2Start date: 01/05/2023   CONTINUE these medications which have NOT CHANGED  Details acetaminophen (TYLENOL) 500 mg tablet Take 1 tablet (500 mg total) by mouth every 4 (four) hours as needed.Qty: 30 tablet, Refills: 11  biotin 1 mg tablet Take 1 tablet (1,000 mcg total) by mouth daily.  cetirizine (ZYRTEC) 10 mg tablet Take 1 tablet (10 mg total) by mouth daily.Qty: 14 tablet, Refills: 0  cholecalciferol, vitamin D3, 125 mcg (5,000 unit) tablet Take 1 tablet (5,000 Units total) by mouth every other day.  diphenhydramine HCl (BENADRYL ALLERGY ORAL) Take 1 tablet by mouth as needed.  emollient (BIAFINE) cream Apply topically 2 (two) times daily.Qty: 454 g, Refills: 0  enoxaparin (LOVENOX) 100 mg/mL syringe Inject 1 mL (100 mg total) under the skin daily.Qty: 7 mL, Refills: 1  gabapentin (NEURONTIN) 300 mg capsule Take 1 capsule (300 mg total) by mouth nightly.  glipiZIDE (GLUCOTROL) 10 mg Immediate Release tablet Take 1 tablet (10 mg total) by mouth daily before breakfast.Qty: 90 tablet, Refills: 0  insulin glargine (LANTUS) 100 unit/mL vial Inject 10 Units under the skin nightly. JARDIANCE 25 mg tablet Take 1 tablet (25 mg total) by mouth daily.  losartan (COZAAR) 25 mg tablet Take 1 tablet (25 mg total) by mouth daily.  meclizine (ANTIVERT) 12.5 mg tablet Take 1 tablet (12.5 mg total) by mouth daily as needed for DizzinessQty: 30 tablet, Refills: 1  Associated Diagnoses: Vertigo  melatonin 10 mg Tab Take 10 mg by mouth nightly as needed.   multivitamin tablet Take 1 tablet by mouth daily.  olopatadine (PATADAY) 0.2 % ophthalmic solution Place 1 drop into both eyes daily.Qty: 2.5 mL, Refills: 6  Associated Diagnoses: Allergic conjunctivitis of both eyes  ondansetron (ZOFRAN) 4 MG tablet Take 1 tablet (4 mg total) by mouth every 8 (eight) hours as needed for nausea.  ONETOUCH DELICA PLUS LANCET 33 gauge   ONETOUCH ULTRA TEST test strips   ONETOUCH ULTRA2 METER device USE TO CHECK GLUCOSE THREE TIMES DAILY AS NEEDED FOR 30 DAYS  phytonadione, vit K1, (VITAMIN K) 100 mcg tablet Take 1 tablet (100 mcg total) by mouth daily.  pravastatin (PRAVACHOL) 40 MG tablet Take 1.5 tablets (60 mg total) by mouth nightly.  QUEtiapine (SEROQUEL) 50 mg Immediate Release tablet Take 1 tablet (50 mg total) by mouth nightly.  senna (SENOKOT) 8.6 mg tablet Take 2 tablets by mouth nightly.Rob Bunting: 30 tablet, Refills: 11  Associated Diagnoses: SBO (small bowel obstruction) (HC Code) (HC CODE) (HC Code)  sitaGLIPtin (JANUVIA) 100 MG tablet Take 1 tablet (100 mg total) by mouth daily.  triamcinolone (KENALOG) 0.1 % cream Apply topically 2 (two) times daily.Qty: 30 g, Refills: 0  venlafaxine (EFFEXOR XR) 150 mg XR 24 hr extended release capsule Take 1 capsule (150 mg total) by mouth daily.  warfarin (COUMADIN) 5 mg tablet daily. Take by mouth 7.5 mg (5 mg x 1.5) every Tue, Thu; 5 mg (5 mg x 1) all other days     Electronically Signed:Ubah Radke, PA 01/05/2023 6:29 AM

## 2023-01-05 NOTE — Plan of Care
Problem: Adult Inpatient Plan of CareGoal: Readiness for Transition of CareOutcome: Outcome(s) achieved Plan of Care Overview/ Patient Status    Case Management Plan  Flowsheet Row Most Recent Value Discharge Planning  Patient/Patient Representative goals/treatment preferences for discharge are:  return home Patient/Patient Representative was presented with a list of facilities, agencies and/or dme providers and Referral(s) placed for: None Mode of Transportation  Private car  (add comment for special considerations) Patient accompanied by husband CM D/C Readiness  PASRR completed and approved N/A Authorization number obtained, if required N/A Is there a 3 day INPATIENT Qualifying stay for Medicare Patients? N/A Medicare IM- signed, dated, timed and scanned, if required Yes DME Authorized/Delivered N/A No needs identified/ follow up with PCP/MD Yes Post acute care services secured W10 complete N/A Pri Completed and Accepted  N/A Is the destination address correct on the W10 Yes Finalized Plan  Expected Discharge Date 01/05/23 Discharge Disposition Home or Self Care  Per team, patient is medically cleared to discharge home no needs. Patient is in agreement with the DC plan an will make follow up appt if needed after discharge. Husband will drive her home. Catha Nottingham RN BSNCare ManagerYale La Platte HospitalMHB [phone]

## 2023-01-06 ENCOUNTER — Encounter: Admit: 2023-01-06 | Payer: PRIVATE HEALTH INSURANCE | Primary: Internal Medicine

## 2023-01-07 ENCOUNTER — Ambulatory Visit: Admit: 2023-01-07 | Payer: PRIVATE HEALTH INSURANCE | Attending: Hematology & Oncology | Primary: Internal Medicine

## 2023-01-07 ENCOUNTER — Inpatient Hospital Stay: Admit: 2023-01-07 | Discharge: 2023-01-07 | Payer: PRIVATE HEALTH INSURANCE | Primary: Internal Medicine

## 2023-01-07 ENCOUNTER — Ambulatory Visit: Admit: 2023-01-07 | Payer: PRIVATE HEALTH INSURANCE | Primary: Internal Medicine

## 2023-01-07 ENCOUNTER — Encounter: Admit: 2023-01-07 | Payer: PRIVATE HEALTH INSURANCE | Primary: Internal Medicine

## 2023-01-07 ENCOUNTER — Encounter: Admit: 2023-01-07 | Payer: PRIVATE HEALTH INSURANCE | Attending: Hematology & Oncology | Primary: Internal Medicine

## 2023-01-07 DIAGNOSIS — K436 Other and unspecified ventral hernia with obstruction, without gangrene: Secondary | ICD-10-CM

## 2023-01-07 DIAGNOSIS — K55039 Acute (reversible) ischemia of large intestine, extent unspecified: Secondary | ICD-10-CM

## 2023-01-07 DIAGNOSIS — E119 Type 2 diabetes mellitus without complications: Secondary | ICD-10-CM

## 2023-01-07 DIAGNOSIS — K56609 Unspecified intestinal obstruction, unspecified as to partial versus complete obstruction: Secondary | ICD-10-CM

## 2023-01-07 DIAGNOSIS — D6862 Lupus anticoagulant syndrome: Secondary | ICD-10-CM

## 2023-01-07 DIAGNOSIS — K559 Vascular disorder of intestine, unspecified: Secondary | ICD-10-CM

## 2023-01-07 DIAGNOSIS — F32A Depression: Secondary | ICD-10-CM

## 2023-01-07 DIAGNOSIS — E7800 Pure hypercholesterolemia, unspecified: Secondary | ICD-10-CM

## 2023-01-07 DIAGNOSIS — L719 Rosacea, unspecified: Secondary | ICD-10-CM

## 2023-01-07 DIAGNOSIS — Z7901 Long term (current) use of anticoagulants: Secondary | ICD-10-CM

## 2023-01-07 DIAGNOSIS — R42 Dizziness and giddiness: Secondary | ICD-10-CM

## 2023-01-07 LAB — PROTIME AND INR
BKR INR: 1.86 — ABNORMAL HIGH (ref 0.87–1.12)
BKR PROTHROMBIN TIME: 19.4 seconds — ABNORMAL HIGH (ref 9.4–11.9)

## 2023-01-07 NOTE — Progress Notes
 Office Visit - Consultation  Referring Physician: Dr Charlett Nose for initial consult: + lupus anticoagulant, hx of anticoagulationSubjective History of Present Illness/Interval History  Patient's last INR was 1.2, which is below goal range of 2.0-3.0.  Patient was admitted for small bowel obstruction and was sent home bridging with Lovenox.  She was instructed to  continue Lovenox and take warfarin 7.5 mg 9/10 and 9/11  as indicated in the maintenance plan (5mg  daily)  Next INR assessment due  01/07/23 .  Medications: Medications reviewed and reconciledCurrent Outpatient Medications:   acetaminophen, Take 1 tablet (500 mg total) by mouth every 4 (four) hours as needed.  biotin, Take 1 tablet (1,000 mcg total) by mouth daily.  cetirizine, Take 1 tablet (10 mg total) by mouth daily.  cholecalciferol (vitamin D3), Take 1 tablet (5,000 Units total) by mouth every other day.  diphenhydramine HCl (BENADRYL ALLERGY ORAL), Take 1 tablet by mouth as needed.  emollient, Apply topically 2 (two) times daily.  enoxaparin, Inject 1 mL (100 mg total) under the skin daily.  gabapentin, Take 1 capsule (300 mg total) by mouth nightly.  glipiZIDE, Take 1 tablet (10 mg total) by mouth daily before breakfast.  insulin glargine, Inject 10 Units under the skin nightly.  Jardiance, Take 1 tablet (25 mg total) by mouth daily.  losartan, Take 1 tablet (25 mg total) by mouth daily.  meclizine, Take 1 tablet (12.5 mg total) by mouth daily as needed for Dizziness  melatonin, Take 10 mg by mouth nightly as needed.   multivitamin, Take 1 tablet by mouth daily.  olopatadine, Place 1 drop into both eyes daily.  ondansetron, Take 1 tablet (4 mg total) by mouth every 8 (eight) hours as needed for nausea.  OneTouch Delica Plus Lancet,   OneTouch Ultra Test,   OneTouch Ultra2 Meter, USE TO CHECK GLUCOSE THREE TIMES DAILY AS NEEDED FOR 30 DAYS  vitamin k, Take 1 tablet (100 mcg total) by mouth daily.  polyethylene glycol, Take 1 packet (17 g total) by mouth daily. Mix in 8 ounces of water, juice, soda, coffee or tea prior to taking.  pravastatin, Take 1.5 tablets (60 mg total) by mouth nightly.  QUEtiapine, Take 1 tablet (50 mg total) by mouth nightly.  senna, Take 2 tablets by mouth nightly.[name]  SITagliptin phosphate, Take 1 tablet (100 mg total) by mouth daily.  triamcinolone, Apply topically 2 (two) times daily.  venlafaxine XR, Take 1 capsule (150 mg total) by mouth daily.  warfarin, daily. Take by mouth 7.5 mg (5 mg x 1.5) every Tue, Thu; 5 mg (5 mg x 1) all other daysAllergies:Morphine; Biaxin  [clarithromycin]; Latex, natural rubber; Mitosol [mitomycin]; and OxycodoneImmunization History Administered Date(s) Administered  Influenza, high dose, quad, 0.7 mL, preservative free 01/17/2019  Influenza, high-dose, split virus, trivalent,(33Yr+),injectable, preservative free 01/02/2017, 03/17/2018  Influenza, injectable, quadrivalent, preservative free 02/23/2014  Influenza, trivalent, injectable, contains preservative 01/17/2021  Pneumococcal polysaccharide PPSV23 02/23/2014, 12/27/2017  Tdap 11/26/2018 Past Medical History Past Surgical History She has a past medical history of Acute ischemic colitis (HC Code) (HC CODE) (HC Code), Depression, Diabetes mellitus (HC Code), Hypercholesteremia, Lupus anticoagulant disorder (HC Code), Rosacea, Ventral hernia with bowel obstruction, and Vertigo.  She has a past surgical history that includes Knee ligament reconstruction (Right, 2004); Laparoscopic Nissen fundoplication; Umbilical hernia repair; Ventral hernia repair; Tubal ligation; THYROID BIOPSY; Colostomy (05/27/13); Sinus surgery; Abdominal adhesion surgery (2016); Blepharoplasty (Bilateral, 05/2016); Colostomy closure; and Hysteroscopy w/ polypectomy (06/18/2020). Social History Family History She reports that she has never  smoked. She has never used smokeless tobacco. She reports that she does not currently use alcohol after a past usage of about 1.0 standard drink of alcohol per week. She reports that she does not use drugs.  Cancer-related family history includes Bladder cancer in her mother; Breast cancer in her maternal cousin; Colon cancer in her father, maternal cousin, and paternal aunt. Herfamily history includes Bladder cancer in her mother; Breast cancer in her maternal cousin; Colon cancer in her father, maternal cousin, and paternal aunt; Diabetes in her father, paternal uncle, and sister.  Objective Physical Examination: Vitals:  01/07/23 1133 BP: (!) 157/79 Pulse: (!) 97 Resp: 16 Temp: 98 ?F (36.7 ?C) TempSrc: Temporal SpO2: 98% Weight: 71.2 kg BMI Readings from Last 3 Encounters: 01/07/23 32.81 kg/m? 01/01/23 33.17 kg/m? 11/04/22 33.13 kg/m?  General: No Acute Distress, ambulates with canePulmonary: CTAB; Normal effort; No wheezes/rhonchi/rales Cardiac: normal S1, normal S2, RRR, no M/R/G Abdomen: Soft, non-tender, no HSM, well healed surgical scars, bruisingPsychiatric: Appropriate, Alert and Oriented x3 Extremities: No EdemaLabs, Imaging, Misc:  Reviewed in [facility] Results Component Value Date  WBC 8.1 01/05/2023  HGB 11.8 01/05/2023  HCT 37.00 01/05/2023  MCV 81.9 01/05/2023  PLT 342 01/05/2023  ANCANC 5.64 01/05/2023 Lab Results Component Value Date  NA 139 01/05/2023  K 4.4 01/05/2023  CL 104 01/05/2023  CO2 28 01/05/2023  BUN 17 01/05/2023  CREATININE 0.70 01/05/2023  GLU 127 (H) 01/05/2023  ALBUMIN 4.2 01/01/2023 Lab Results Component Value Date  ALT 18 01/01/2023  AST  01/01/2023    Comment:    Sample Hemolyzed.   ALKPHOS 148 (H) 01/01/2023  BILITOT 0.3 01/01/2023 No orders of the defined types were placed in this encounter.Assessment  Impression and PlanSofia [name] is a 72 y.o.-year-old femaleTransfer of careHx of + lupus anticoagulant, on warfarin, hx of ischemic colitis. Has recurrent bowel obstructionsWas recently hospitalizedCurrently on LMWH still as last INR was lowCheck INR todayToday: All questions answered in detail.  Maria Ridges, MD

## 2023-01-07 NOTE — Progress Notes
 Post Discharge Outreach: Transition of Care Note Care Navigation spoke with: PatientDischarging Hospital[facility] Admission Date: 9/6/2024Hospital Discharge Date: 01/05/2023    Diagnosis: SBODischarge location: HomeRisk of Unplanned Readmission: 22.11%HOSPITALIZATION:Reason patient admitted: ABDOMINAL PAINDiagnosis at discharge: SBOCURRENT STATE:Since discharge patient reports feeling: BetterNew symptoms reported include: SPOKE TO Taysia AND SHE REPORTS FEELING WELL.  SHE LIVES WITH SPOUSE AND IS INDEPENDENT WITH ALL CARE.  SHE DENIES FEVER, CHILLS, VOMITING.  SHE IS HAVING MILD NAUSEA BUT IS ABLE TO TAKE PO.  SHE HAD SOME DIARRHEA THIS MORNING.  SHE CONTINUES WITH THE LOVENOX AS ORDERED.  SHE IS TAKING COUMADIN 7.5 MG PO DAILY PER COUMADIN CLINIC.  SHE HAS AN INR SCHEDULED FOR LATER TODAY.  SHE IS AWARE OF HEMATOLOGY APPT TODAY AND IS SEEING PCP LATER TODAY AS WELL.  NO QUESTIONS OR CONCERNS.  Patient cared for by: INDEPENDENT  REVIEW OF AFTER VISIT SUMMARY DOCUMENT:Patient specific questions regarding discharge: NONEMEDICATION CHANGES:Validated NEW medications to take: YesValidated Changed medications to take: YesValidated Stopped medications to NOT take: N/AIssues obtaining prescriptions: NoAdditional medication related notes: START taking: polyethylene glycol (MIRALAX)  CHANGE how you take: enoxaparin (LOVENOX) - Another medication with the same name was removed. Continue taking this medication, and follow the directions you see here. warfarin (COUMADIN) - Another medication with the same name was removed. Continue taking this medication, and follow the directions you see here. FOLLOW-UP APPOINTMENTS and TRANSPORTATION:Patient aware of scheduled appointments: YesAwareness and assistance with appointments needing to be scheduled: N/ATransportation concerns for follow-up appointment: NoDME and HOME HEALTH SERVICES:Durable medical equipment received: N/AContact has been made with home care agency: N/APlan established for follow up labs/tests: YesOther follow up items: NONEADDITIONAL PATIENT NEEDS:Identified Social Determinants of Health opportunities: NONE  Additional patient needs addressed: NONEAdditional issues/barriers identified: NONE

## 2023-01-08 ENCOUNTER — Encounter: Admit: 2023-01-08 | Payer: PRIVATE HEALTH INSURANCE | Attending: Pharmacotherapy | Primary: Internal Medicine

## 2023-01-08 DIAGNOSIS — D6862 Lupus anticoagulant syndrome: Secondary | ICD-10-CM

## 2023-01-08 DIAGNOSIS — Z5181 Encounter for therapeutic drug level monitoring: Secondary | ICD-10-CM

## 2023-01-08 NOTE — Progress Notes
 Pharmacist Anticoagulation Clinic Telephone Visit Follow Maria [name] (03/01/1951) is on chronic anticoagulation for the diagnosis of  Lupus anticoagulant with hypercoagulable state (HC Code)  (primary encounter diagnosis) Ischemic colitis (HC Code) with an INR goal of 2.0-3.0.  The patient was contacted regarding lab drawn INR results.  Patient's INR findings and prescribed dose from last visit:  01/05/2023   3:34 PM 01/07/2023   2:41 PM 01/07/2023   5:03 PM Anticoagulation Monitoring Assoc. INR Date 01/05/2023 01/07/2023  Associated INR 1.20 1.86  Instructions 9/10: 7.5 mg; 9/11: 7.5 mg; Otherwise 5 mg every day 9/12: 7.5 mg; Otherwise 5 mg every day 9/12: 7.5 mg; Otherwise 5 mg every day Sunday Dose - 5 mg 5 mg Monday Dose - 5 mg 5 mg Tuesday Dose 7.5 mg (9/10) 5 mg 5 mg Wednesday Dose 7.5 mg (9/11) 5 mg 5 mg Thursday Dose - 7.5 mg (9/12) 7.5 mg (9/12) Friday Dose - 5 mg 5 mg Saturday Dose - 5 mg 5 mg Last 7 day dose total 35 mg 40 mg 40 mg Next INR Date 01/07/2023 01/14/2023  The following findings were reported from today's call (no findings if none selected):[]  Missed doses []  Extra doses []  Change in medications []  Change in vitamin k rich food intake (green vegetables, herbals, etc) []  Change in alcohol use []  Change in overall health [x]  Recent hospitalization / emergency department visit []  Upcoming procedure (including dental procedures) []  Other Comments:       Patient was admitted for SBO The following side effects were reported from today's call (no side effects if none selected):[]  Bleeding []  New bruising []  Warfarin-emergency department visit or hospital admission []  Other Comments:   No side effects Assessment/Plan:Patient's INR is 1.9, which is below goal range of 2.0-3.0.  Patient was admitted for SBO and was discharged without surgical intervention. Dr. Daphine Deutscher would like the patient to stop Lovenox injections as she is almost at goal.  She was instructed to continue current weekly dose and stop Lovenox injections  as indicated in the maintenance plan below.  Next INR assessment due 1 week.  Anticoagulation Summary  As of 01/07/2023  INR goal:  2.0-3.0 TTR:  48.0 % (8.3 y) INR used for dosing:  1.86 (01/07/2023) Warfarin maintenance plan:  5 mg (5 mg x 1) every day Weekly warfarin total:  35 mg Plan last modified:  Markus Daft, RPH (12/30/2022) Next INR check:  01/14/2023 Priority:  High Priority Target end date:  Indefinite  Indications  Lupus anticoagulant with hypercoagulable state (HC Code) [D68.62]Ischemic colitis (HC Code) [K55.9]   Anticoagulation Episode Summary   INR check location:    Preferred lab:    Send INR reminders to:  [facility] INR POOL  Comments:    Anticoagulation Care Providers   Provider Role Specialty Phone number  [name], MD Responsible Medical Oncology [phone]  [name], [place] Responsible Medical Oncology [phone]  All patient's medications have been reviewed and updated as needed.  Electronically Signed by [name], RPH, January 08, 2023 [facility]  [address] [place], [place] [address]  Phone: [phone]  Fax: [phone]

## 2023-01-15 ENCOUNTER — Encounter: Admit: 2023-01-15 | Payer: PRIVATE HEALTH INSURANCE | Attending: Pharmacotherapy | Primary: Internal Medicine

## 2023-01-15 DIAGNOSIS — D6862 Lupus anticoagulant syndrome: Secondary | ICD-10-CM

## 2023-01-15 DIAGNOSIS — Z5181 Encounter for therapeutic drug level monitoring: Secondary | ICD-10-CM

## 2023-01-22 ENCOUNTER — Encounter: Admit: 2023-01-22 | Payer: PRIVATE HEALTH INSURANCE | Attending: Pharmacotherapy | Primary: Internal Medicine

## 2023-01-22 DIAGNOSIS — D6862 Lupus anticoagulant syndrome: Secondary | ICD-10-CM

## 2023-01-22 DIAGNOSIS — Z5181 Encounter for therapeutic drug level monitoring: Secondary | ICD-10-CM

## 2023-01-25 ENCOUNTER — Encounter: Admit: 2023-01-25 | Payer: PRIVATE HEALTH INSURANCE | Primary: Internal Medicine

## 2023-01-29 ENCOUNTER — Encounter: Admit: 2023-01-29 | Payer: PRIVATE HEALTH INSURANCE | Attending: Pharmacotherapy | Primary: Internal Medicine

## 2023-01-29 DIAGNOSIS — Z5181 Encounter for therapeutic drug level monitoring: Secondary | ICD-10-CM

## 2023-01-29 DIAGNOSIS — D6862 Lupus anticoagulant syndrome: Secondary | ICD-10-CM

## 2023-02-02 ENCOUNTER — Inpatient Hospital Stay: Admit: 2023-02-02 | Discharge: 2023-02-02 | Payer: PRIVATE HEALTH INSURANCE | Primary: Internal Medicine

## 2023-02-02 ENCOUNTER — Ambulatory Visit: Admit: 2023-02-02 | Payer: PRIVATE HEALTH INSURANCE | Attending: Hematology & Oncology | Primary: Internal Medicine

## 2023-02-02 ENCOUNTER — Telehealth: Admit: 2023-02-02 | Payer: PRIVATE HEALTH INSURANCE | Primary: Internal Medicine

## 2023-02-02 DIAGNOSIS — D6862 Lupus anticoagulant syndrome: Secondary | ICD-10-CM

## 2023-02-02 DIAGNOSIS — Z7901 Long term (current) use of anticoagulants: Secondary | ICD-10-CM

## 2023-02-02 DIAGNOSIS — Z5181 Encounter for therapeutic drug level monitoring: Secondary | ICD-10-CM

## 2023-02-02 LAB — PROTIME AND INR
BKR INR: 2.71 — ABNORMAL HIGH (ref 0.86–1.13)
BKR PROTHROMBIN TIME: 27.9 s — ABNORMAL HIGH (ref 9.6–12.3)

## 2023-02-02 NOTE — Telephone Encounter
 Called patient to remind her to draw INR and she stated she did it today but could not talk because her sugar is low. I will call her as soon as her result comes back.[name], RPHClinical Pharmacist, Anticoagulation ClinicSmilow Campbell [facility] and [facility]

## 2023-02-03 ENCOUNTER — Ambulatory Visit: Admit: 2023-02-03 | Payer: PRIVATE HEALTH INSURANCE | Primary: Internal Medicine

## 2023-02-03 ENCOUNTER — Encounter: Admit: 2023-02-03 | Payer: PRIVATE HEALTH INSURANCE | Primary: Internal Medicine

## 2023-02-03 DIAGNOSIS — E7800 Pure hypercholesterolemia, unspecified: Secondary | ICD-10-CM

## 2023-02-03 DIAGNOSIS — K55039 Acute (reversible) ischemia of large intestine, extent unspecified: Secondary | ICD-10-CM

## 2023-02-03 DIAGNOSIS — E119 Type 2 diabetes mellitus without complications: Secondary | ICD-10-CM

## 2023-02-03 DIAGNOSIS — L719 Rosacea, unspecified: Secondary | ICD-10-CM

## 2023-02-03 DIAGNOSIS — D6862 Lupus anticoagulant syndrome: Secondary | ICD-10-CM

## 2023-02-03 DIAGNOSIS — K559 Vascular disorder of intestine, unspecified: Secondary | ICD-10-CM

## 2023-02-03 DIAGNOSIS — K436 Other and unspecified ventral hernia with obstruction, without gangrene: Secondary | ICD-10-CM

## 2023-02-03 DIAGNOSIS — R42 Dizziness and giddiness: Secondary | ICD-10-CM

## 2023-02-03 DIAGNOSIS — F32A Depression: Secondary | ICD-10-CM

## 2023-02-03 NOTE — Progress Notes
 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  The family ( husband Heather Roberts)  was contacted regarding lab drawn INR results.  Patient's INR findings and prescribed dose from last visit:  01/07/2023   2:41 PM 01/07/2023   5:03 PM 02/03/2023  10:17 AM Anticoagulation Monitoring Assoc. INR Date 01/07/2023  02/02/2023 Associated INR 1.86  2.71 Instructions 9/12: 7.5 mg; Otherwise 5 mg every day 9/12: 7.5 mg; Otherwise 5 mg every day 5 mg every day Sunday Dose 5 mg 5 mg 5 mg Monday Dose 5 mg 5 mg 5 mg Tuesday Dose 5 mg 5 mg 5 mg Wednesday Dose 5 mg 5 mg 5 mg Thursday Dose 7.5 mg (9/12) 7.5 mg (9/12) 5 mg Friday Dose 5 mg 5 mg 5 mg Saturday Dose 5 mg 5 mg 5 mg Last 7 day dose total 40 mg 40 mg 35 mg Next INR Date 01/14/2023 01/14/2023 03/02/2023 Findings from today's call:Unable to assess, left voice message. Side effects reported:Unable to assess, left voice message.  Assessment/Plan:Patient's INR is 2.71, which is within goal range of 2.0-3.0.  Patient's chart was reviewed and voice message was left instructing patient to call the clinic if there have been any changes to prescription or over the counter medications or diet, if experiencing a current illness, if there are any planned upcoming invasive/dental procedures or any other changes that could affect warfarin therapy.  She was instructed to continue current weekly dose as indicated in the maintenance plan below.  Next INR assessment due 4 weeks.  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  Electronically Signed by [name], RPH, February 03, 2023 [facility] Anticoagulation Clinic  [address] [place], [place] [address]  Phone: [phone]  Fax: [phone]

## 2023-02-05 ENCOUNTER — Encounter: Admit: 2023-02-05 | Payer: PRIVATE HEALTH INSURANCE | Attending: Pharmacotherapy | Primary: Internal Medicine

## 2023-02-05 DIAGNOSIS — Z5181 Encounter for therapeutic drug level monitoring: Secondary | ICD-10-CM

## 2023-02-05 DIAGNOSIS — D6862 Lupus anticoagulant syndrome: Secondary | ICD-10-CM

## 2023-02-10 ENCOUNTER — Inpatient Hospital Stay: Admit: 2023-02-10 | Discharge: 2023-02-10 | Payer: PRIVATE HEALTH INSURANCE | Primary: Internal Medicine

## 2023-02-10 DIAGNOSIS — Z5181 Encounter for therapeutic drug level monitoring: Secondary | ICD-10-CM

## 2023-02-10 DIAGNOSIS — Z7901 Long term (current) use of anticoagulants: Secondary | ICD-10-CM

## 2023-02-10 LAB — PROTIME AND INR
BKR INR: 2.79 — ABNORMAL HIGH (ref 0.86–1.13)
BKR PROTHROMBIN TIME: 28.7 s — ABNORMAL HIGH (ref 9.6–12.3)

## 2023-02-11 ENCOUNTER — Encounter: Admit: 2023-02-11 | Payer: PRIVATE HEALTH INSURANCE | Primary: Internal Medicine

## 2023-02-11 ENCOUNTER — Ambulatory Visit: Admit: 2023-02-11 | Payer: PRIVATE HEALTH INSURANCE | Primary: Internal Medicine

## 2023-02-11 DIAGNOSIS — K559 Vascular disorder of intestine, unspecified: Secondary | ICD-10-CM

## 2023-02-11 DIAGNOSIS — E119 Type 2 diabetes mellitus without complications: Secondary | ICD-10-CM

## 2023-02-11 DIAGNOSIS — E7800 Pure hypercholesterolemia, unspecified: Secondary | ICD-10-CM

## 2023-02-11 DIAGNOSIS — D6862 Lupus anticoagulant syndrome: Secondary | ICD-10-CM

## 2023-02-11 DIAGNOSIS — K55039 Acute (reversible) ischemia of large intestine, extent unspecified: Secondary | ICD-10-CM

## 2023-02-11 DIAGNOSIS — F32A Depression: Secondary | ICD-10-CM

## 2023-02-11 DIAGNOSIS — L719 Rosacea, unspecified: Secondary | ICD-10-CM

## 2023-02-11 DIAGNOSIS — R42 Dizziness and giddiness: Secondary | ICD-10-CM

## 2023-02-11 DIAGNOSIS — K436 Other and unspecified ventral hernia with obstruction, without gangrene: Secondary | ICD-10-CM

## 2023-02-11 NOTE — Progress Notes
 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  The patient was contacted regarding lab drawn INR results.  Patient's INR findings and prescribed dose from last visit:  01/07/2023   5:03 PM 02/03/2023  10:17 AM 02/11/2023  10:01 AM Anticoagulation Monitoring Assoc. INR Date  02/02/2023 02/10/2023 Associated INR  2.71 2.79 Instructions 9/12: 7.5 mg; Otherwise 5 mg every day 5 mg every day 5 mg every day Sunday Dose 5 mg 5 mg 5 mg Monday Dose 5 mg 5 mg 5 mg Tuesday Dose 5 mg 5 mg 5 mg Wednesday Dose 5 mg 5 mg 5 mg Thursday Dose 7.5 mg (9/12) 5 mg 5 mg Friday Dose 5 mg 5 mg 5 mg Saturday Dose 5 mg 5 mg 5 mg Last 7 day dose total 40 mg 35 mg 35 mg Next INR Date 01/14/2023 03/02/2023 03/10/2023 The following findings were reported from today's call (no findings if none selected):[]  Missed doses []  Extra doses []  Change in medications []  Change in vitamin k rich food intake (green vegetables, herbals, etc) []  Change in alcohol use []  Change in overall health []  Recent hospitalization / emergency department visit []  Upcoming procedure (including dental procedures) []  Other Comments:       No findings The following side effects were reported from today's call (no side effects if none selected):[]  Bleeding []  New bruising []  Warfarin-emergency department visit or hospital admission []  Other Comments:   No side effects Assessment/Plan:Patient's INR is 2.79, which is within goal range of 2.0-3.0.  She was instructed to continue current weekly dose as indicated in the maintenance plan below.  Next INR assessment due 4 weeks.  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  Electronically Signed by [name], RPH, February 11, 2023 [facility] Anticoagulation Clinic  [address] [place], [place] [address]  Phone: [phone]  Fax: [phone]

## 2023-02-12 ENCOUNTER — Encounter: Admit: 2023-02-12 | Payer: PRIVATE HEALTH INSURANCE | Attending: Pharmacotherapy | Primary: Internal Medicine

## 2023-02-12 DIAGNOSIS — Z5181 Encounter for therapeutic drug level monitoring: Secondary | ICD-10-CM

## 2023-02-12 DIAGNOSIS — D6862 Lupus anticoagulant syndrome: Secondary | ICD-10-CM

## 2023-02-19 ENCOUNTER — Encounter: Admit: 2023-02-19 | Payer: PRIVATE HEALTH INSURANCE | Attending: Pharmacotherapy | Primary: Internal Medicine

## 2023-02-19 DIAGNOSIS — Z5181 Encounter for therapeutic drug level monitoring: Secondary | ICD-10-CM

## 2023-02-19 DIAGNOSIS — D6862 Lupus anticoagulant syndrome: Secondary | ICD-10-CM

## 2023-02-26 ENCOUNTER — Encounter: Admit: 2023-02-26 | Payer: PRIVATE HEALTH INSURANCE | Attending: Pharmacotherapy | Primary: Internal Medicine

## 2023-02-26 DIAGNOSIS — Z5181 Encounter for therapeutic drug level monitoring: Secondary | ICD-10-CM

## 2023-02-26 DIAGNOSIS — D6862 Lupus anticoagulant syndrome: Secondary | ICD-10-CM

## 2023-03-05 ENCOUNTER — Encounter: Admit: 2023-03-05 | Payer: PRIVATE HEALTH INSURANCE | Attending: Pharmacotherapy | Primary: Internal Medicine

## 2023-03-05 DIAGNOSIS — D6862 Lupus anticoagulant syndrome: Secondary | ICD-10-CM

## 2023-03-05 DIAGNOSIS — Z5181 Encounter for therapeutic drug level monitoring: Secondary | ICD-10-CM

## 2023-03-08 ENCOUNTER — Encounter: Admit: 2023-03-08 | Payer: PRIVATE HEALTH INSURANCE | Attending: Gastroenterology | Primary: Internal Medicine

## 2023-03-11 ENCOUNTER — Inpatient Hospital Stay: Admit: 2023-03-11 | Discharge: 2023-03-11 | Payer: PRIVATE HEALTH INSURANCE | Primary: Internal Medicine

## 2023-03-11 ENCOUNTER — Ambulatory Visit: Admit: 2023-03-11 | Payer: PRIVATE HEALTH INSURANCE | Attending: Hematology & Oncology | Primary: Internal Medicine

## 2023-03-11 DIAGNOSIS — Z5181 Encounter for therapeutic drug level monitoring: Secondary | ICD-10-CM

## 2023-03-11 DIAGNOSIS — D6862 Lupus anticoagulant syndrome: Secondary | ICD-10-CM

## 2023-03-11 DIAGNOSIS — Z7901 Long term (current) use of anticoagulants: Secondary | ICD-10-CM

## 2023-03-11 LAB — PROTIME AND INR
BKR INR: 1.62 — ABNORMAL HIGH (ref 0.86–1.13)
BKR PROTHROMBIN TIME: 17.3 s — ABNORMAL HIGH (ref 9.6–12.3)

## 2023-03-12 ENCOUNTER — Encounter: Admit: 2023-03-12 | Payer: PRIVATE HEALTH INSURANCE | Attending: Pharmacotherapy | Primary: Internal Medicine

## 2023-03-12 ENCOUNTER — Ambulatory Visit: Admit: 2023-03-12 | Payer: PRIVATE HEALTH INSURANCE | Primary: Internal Medicine

## 2023-03-12 DIAGNOSIS — D6862 Lupus anticoagulant syndrome: Secondary | ICD-10-CM

## 2023-03-12 DIAGNOSIS — K559 Vascular disorder of intestine, unspecified: Secondary | ICD-10-CM

## 2023-03-12 DIAGNOSIS — Z5181 Encounter for therapeutic drug level monitoring: Secondary | ICD-10-CM

## 2023-03-12 NOTE — Progress Notes
 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  The patient was contacted regarding lab drawn INR results.  Patient's INR findings and prescribed dose from last visit:  02/03/2023  10:17 AM 02/11/2023  10:01 AM 03/12/2023   3:09 PM Anticoagulation Monitoring Assoc. INR Date 02/02/2023 02/10/2023 03/11/2023 Associated INR 2.71 2.79 1.62 Instructions 5 mg every day 5 mg every day 7.5 mg every Fri; 5 mg all other days Sunday Dose 5 mg 5 mg 5 mg Monday Dose 5 mg 5 mg 5 mg Tuesday Dose 5 mg 5 mg 5 mg Wednesday Dose 5 mg 5 mg 5 mg Thursday Dose 5 mg 5 mg 5 mg Friday Dose 5 mg 5 mg 7.5 mg Saturday Dose 5 mg 5 mg 5 mg Last 7 day dose total 35 mg 35 mg 35 mg Next INR Date 03/02/2023 03/10/2023 03/23/2023 The following findings were reported from today's call (no findings if none selected):[]  Missed doses []  Extra doses [x]  Change in medications []  Change in vitamin k rich food intake (green vegetables, herbals, etc) []  Change in alcohol use []  Change in overall health []  Recent hospitalization / emergency department visit []  Upcoming procedure (including dental procedures) [x]  Other Comments:       Patient has stopped taking Tylenol and has been doing PT The following side effects were reported from today's call (no side effects if none selected):[]  Bleeding []  New bruising []  Warfarin-emergency department visit or hospital admission []  Other Comments:   No side effects Assessment/Plan:Patient's INR is 1.62, which is below goal range of 2.0-3.0.  Patient has stopped taking Tylenol and has been doing PT.  She was instructed to increase weekly dose by 7% as indicated in the maintenance plan below.  Next INR assessment due  03/23/23 .  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  Electronically Signed by [name], RPH, March 12, 2023 [facility] Anticoagulation Clinic  [address] [place], [place] [address]  Phone: [phone]  Fax: [phone]

## 2023-03-19 ENCOUNTER — Encounter: Admit: 2023-03-19 | Payer: PRIVATE HEALTH INSURANCE | Attending: Pharmacotherapy | Primary: Internal Medicine

## 2023-03-19 DIAGNOSIS — D6862 Lupus anticoagulant syndrome: Secondary | ICD-10-CM

## 2023-03-19 DIAGNOSIS — Z5181 Encounter for therapeutic drug level monitoring: Secondary | ICD-10-CM

## 2023-03-22 ENCOUNTER — Encounter: Admit: 2023-03-22 | Payer: PRIVATE HEALTH INSURANCE | Attending: Hematology & Oncology | Primary: Internal Medicine

## 2023-03-22 DIAGNOSIS — D6862 Lupus anticoagulant syndrome: Secondary | ICD-10-CM

## 2023-03-22 MED ORDER — WARFARIN 5 MG TABLET
5 | ORAL_TABLET | Freq: Every day | ORAL | 6 refills | Status: AC
Start: 2023-03-22 — End: ?

## 2023-03-22 NOTE — Telephone Encounter
 Patient called in and stated she needs a refill on warfarin (COUMADIN) 5 mg tablet - she said she did reach out to the pharmacy but they told her they have an order stating she can no longer fill this prescription and to call doctor. She said she was last in in September and she has a 6 month follow up in March and she regularly follows up with the warfarin clinic . She uses : [id number] - [place], [place] - [address] - she said she needs prescription sent as soon as possible.

## 2023-03-23 ENCOUNTER — Inpatient Hospital Stay: Admit: 2023-03-23 | Discharge: 2023-03-23 | Payer: PRIVATE HEALTH INSURANCE | Primary: Internal Medicine

## 2023-03-23 ENCOUNTER — Ambulatory Visit: Admit: 2023-03-23 | Payer: PRIVATE HEALTH INSURANCE | Attending: Hematology & Oncology | Primary: Internal Medicine

## 2023-03-23 DIAGNOSIS — Z5181 Encounter for therapeutic drug level monitoring: Secondary | ICD-10-CM

## 2023-03-23 DIAGNOSIS — Z7901 Long term (current) use of anticoagulants: Secondary | ICD-10-CM

## 2023-03-23 DIAGNOSIS — D6862 Lupus anticoagulant syndrome: Secondary | ICD-10-CM

## 2023-03-23 LAB — PROTIME AND INR
BKR INR: 1.42 — ABNORMAL HIGH (ref 0.86–1.13)
BKR PROTHROMBIN TIME: 15.3 s — ABNORMAL HIGH (ref 9.6–12.3)

## 2023-03-23 NOTE — Telephone Encounter
 TC to pt to confirm that she was aware her prescription had been signed and sent to the pharmacy. She had received a text.

## 2023-03-24 ENCOUNTER — Ambulatory Visit: Admit: 2023-03-24 | Payer: PRIVATE HEALTH INSURANCE | Primary: Internal Medicine

## 2023-03-24 DIAGNOSIS — K559 Vascular disorder of intestine, unspecified: Secondary | ICD-10-CM

## 2023-03-24 DIAGNOSIS — D6862 Lupus anticoagulant syndrome: Secondary | ICD-10-CM

## 2023-03-24 NOTE — Progress Notes
 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  The family ( husband Maria [name]) was contacted regarding lab drawn INR results.  Patient's INR findings and prescribed dose from last visit:  02/11/2023  10:01 AM 03/12/2023   3:09 PM 03/24/2023   9:05 AM Anticoagulation Monitoring Assoc. INR Date 02/10/2023 03/11/2023 03/23/2023 Associated INR 2.79 1.62 1.42 Instructions 5 mg every day 7.5 mg every Fri; 5 mg all other days 7.5 mg every Mon, Wed, Fri; 5 mg all other days Sunday Dose 5 mg 5 mg 5 mg Monday Dose 5 mg 5 mg 7.5 mg Tuesday Dose 5 mg 5 mg 5 mg Wednesday Dose 5 mg 5 mg 7.5 mg Thursday Dose 5 mg 5 mg 5 mg Friday Dose 5 mg 7.5 mg 7.5 mg Saturday Dose 5 mg 5 mg 5 mg Last 7 day dose total 35 mg 35 mg 37.5 mg Next INR Date 03/10/2023 03/23/2023  The following findings were reported from today's call (no findings if none selected):[]  Missed doses []  Extra doses [x]  Change in medications [x]  Change in vitamin k rich food intake (green vegetables, herbals, etc) []  Change in alcohol use []  Change in overall health []  Recent hospitalization / emergency department visit []  Upcoming procedure (including dental procedures) []  Other Comments:       Patient stopped taking Tylenol around the clock and increased her spinach intake The following side effects were reported from today's call (no side effects if none selected):[]  Bleeding []  New bruising []  Warfarin-emergency department visit or hospital admission []  Other Comments:   No side effects Assessment/Plan:Patient's INR is 1.42, which is below goal range of 2.0-3.0.  Patient stopped taking Tylenol around the clock and increased her spinach intake therefore Dr. Daphine Deutscher would like dose escalation alone.  She was instructed to increase weekly dose by 13% and avoid spinach until goal INR is reached  as indicated in the maintenance plan below.  Next INR assessment due 1 week. 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  Electronically Signed by [name], RPH, March 24, 2023 [facility] Anticoagulation Clinic  [address] [place], [place] [address]  Phone: [phone]  Fax: [phone]

## 2023-03-26 ENCOUNTER — Encounter: Admit: 2023-03-26 | Payer: PRIVATE HEALTH INSURANCE | Attending: Pharmacotherapy | Primary: Internal Medicine

## 2023-03-26 DIAGNOSIS — D6862 Lupus anticoagulant syndrome: Secondary | ICD-10-CM

## 2023-03-26 DIAGNOSIS — Z5181 Encounter for therapeutic drug level monitoring: Secondary | ICD-10-CM

## 2023-04-01 ENCOUNTER — Inpatient Hospital Stay: Admit: 2023-04-01 | Discharge: 2023-04-01 | Payer: PRIVATE HEALTH INSURANCE | Primary: Internal Medicine

## 2023-04-01 DIAGNOSIS — Z5181 Encounter for therapeutic drug level monitoring: Principal | ICD-10-CM

## 2023-04-01 DIAGNOSIS — Z7901 Long term (current) use of anticoagulants: Secondary | ICD-10-CM

## 2023-04-01 LAB — PROTIME AND INR
BKR INR: 4.4 — ABNORMAL HIGH (ref 0.86–1.13)
BKR PROTHROMBIN TIME: 43.9 s — ABNORMAL HIGH (ref 9.6–12.3)

## 2023-04-02 ENCOUNTER — Ambulatory Visit: Admit: 2023-04-02 | Payer: PRIVATE HEALTH INSURANCE | Primary: Internal Medicine

## 2023-04-02 ENCOUNTER — Encounter: Admit: 2023-04-02 | Payer: PRIVATE HEALTH INSURANCE | Primary: Internal Medicine

## 2023-04-02 ENCOUNTER — Encounter: Admit: 2023-04-02 | Payer: PRIVATE HEALTH INSURANCE | Attending: Pharmacotherapy | Primary: Internal Medicine

## 2023-04-02 DIAGNOSIS — L719 Rosacea, unspecified: Secondary | ICD-10-CM

## 2023-04-02 DIAGNOSIS — Z5181 Encounter for therapeutic drug level monitoring: Secondary | ICD-10-CM

## 2023-04-02 DIAGNOSIS — K55039 Acute (reversible) ischemia of large intestine, extent unspecified: Secondary | ICD-10-CM

## 2023-04-02 DIAGNOSIS — E119 Type 2 diabetes mellitus without complications: Secondary | ICD-10-CM

## 2023-04-02 DIAGNOSIS — R42 Dizziness and giddiness: Secondary | ICD-10-CM

## 2023-04-02 DIAGNOSIS — D6862 Lupus anticoagulant syndrome: Secondary | ICD-10-CM

## 2023-04-02 DIAGNOSIS — K436 Other and unspecified ventral hernia with obstruction, without gangrene: Secondary | ICD-10-CM

## 2023-04-02 DIAGNOSIS — E7800 Pure hypercholesterolemia, unspecified: Secondary | ICD-10-CM

## 2023-04-02 DIAGNOSIS — K559 Vascular disorder of intestine, unspecified: Secondary | ICD-10-CM

## 2023-04-02 DIAGNOSIS — F32A Depression: Secondary | ICD-10-CM

## 2023-04-02 NOTE — Progress Notes
 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  The patient was contacted regarding lab drawn INR results.  Patient's INR findings and prescribed dose from last visit:  03/12/2023   3:09 PM 03/24/2023   9:05 AM 04/02/2023   9:47 AM Anticoagulation Monitoring Assoc. INR Date 03/11/2023 03/23/2023 04/01/2023 Associated INR 1.62 1.42 4.40 Instructions 7.5 mg every Fri; 5 mg all other days 7.5 mg every Mon, Wed, Fri; 5 mg all other days 12/6: Hold; Otherwise 5 mg every day Sunday Dose 5 mg 5 mg 5 mg Monday Dose 5 mg 7.5 mg 5 mg Tuesday Dose 5 mg - 5 mg Wednesday Dose 5 mg 7.5 mg 5 mg Thursday Dose 5 mg 5 mg - Friday Dose 7.5 mg 7.5 mg Hold (12/6) Saturday Dose 5 mg 5 mg 5 mg Last 7 day dose total 35 mg 37.5 mg 37.5 mg Next INR Date 03/23/2023 03/30/2023 04/08/2023 The following findings were reported from today's call (no findings if none selected):[x]  Missed doses []  Extra doses [x]  Change in medications []  Change in vitamin k rich food intake (green vegetables, herbals, etc) []  Change in alcohol use []  Change in overall health []  Recent hospitalization / emergency department visit []  Upcoming procedure (including dental procedures) []  Other Comments:       Patient took warfarin 7.5 mg on Friday only with 5 mg all other days and she stopped oral Vitamin K The following side effects were reported from today's call (no side effects if none selected):[]  Bleeding []  New bruising []  Warfarin-emergency department visit or hospital admission []  Other Comments:   No side effects Assessment/Plan:Patient's INR is 4.4, which is above goal range of 2.0-3.0. Patient reports no signs or symptoms of bruising. Patient took warfarin 7.5 mg on Friday only with 5 mg all other days and she stopped oral Vitamin K.  She was instructed to decrease weekly dose by 10%, hold 1 dose(s), and eat spinach today  as indicated in the maintenance plan below.  Next INR assessment due 1 week.  Patient will not resume Vitamin KAnticoagulation Summary  As of 04/02/2023  INR goal:  2.0-3.0 TTR:  48.4% (8.6 y) INR used for dosing:  4.40 (04/01/2023) Warfarin maintenance plan:  5 mg (5 mg x 1) every day Weekly warfarin total:  35 mg Plan last modified:  [name], [facility] 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  Electronically Signed by [name], [facility], April 02, 2023 [facility] Anticoagulation Clinic  [address] [place], [place] [address]  Phone: [phone]  Fax: [phone]

## 2023-04-08 ENCOUNTER — Encounter: Admit: 2023-04-08 | Payer: PRIVATE HEALTH INSURANCE | Attending: Gastroenterology | Primary: Internal Medicine

## 2023-04-09 ENCOUNTER — Encounter: Admit: 2023-04-09 | Payer: PRIVATE HEALTH INSURANCE | Primary: Internal Medicine

## 2023-04-09 ENCOUNTER — Inpatient Hospital Stay: Admit: 2023-04-09 | Discharge: 2023-04-09 | Payer: PRIVATE HEALTH INSURANCE | Primary: Internal Medicine

## 2023-04-09 ENCOUNTER — Ambulatory Visit: Admit: 2023-04-09 | Payer: PRIVATE HEALTH INSURANCE | Primary: Internal Medicine

## 2023-04-09 ENCOUNTER — Encounter: Admit: 2023-04-09 | Payer: PRIVATE HEALTH INSURANCE | Attending: Pharmacotherapy | Primary: Internal Medicine

## 2023-04-09 DIAGNOSIS — K55039 Acute (reversible) ischemia of large intestine, extent unspecified: Secondary | ICD-10-CM

## 2023-04-09 DIAGNOSIS — K559 Vascular disorder of intestine, unspecified: Secondary | ICD-10-CM

## 2023-04-09 DIAGNOSIS — R42 Dizziness and giddiness: Secondary | ICD-10-CM

## 2023-04-09 DIAGNOSIS — D6862 Lupus anticoagulant syndrome: Secondary | ICD-10-CM

## 2023-04-09 DIAGNOSIS — E119 Type 2 diabetes mellitus without complications: Secondary | ICD-10-CM

## 2023-04-09 DIAGNOSIS — F32A Depression: Secondary | ICD-10-CM

## 2023-04-09 DIAGNOSIS — Z7901 Long term (current) use of anticoagulants: Secondary | ICD-10-CM

## 2023-04-09 DIAGNOSIS — Z5181 Encounter for therapeutic drug level monitoring: Principal | ICD-10-CM

## 2023-04-09 DIAGNOSIS — L719 Rosacea, unspecified: Secondary | ICD-10-CM

## 2023-04-09 DIAGNOSIS — K436 Other and unspecified ventral hernia with obstruction, without gangrene: Secondary | ICD-10-CM

## 2023-04-09 DIAGNOSIS — E7800 Pure hypercholesterolemia, unspecified: Secondary | ICD-10-CM

## 2023-04-09 LAB — PROTIME AND INR
BKR INR: 2.57 — ABNORMAL HIGH (ref 0.86–1.13)
BKR PROTHROMBIN TIME: 26.6 s — ABNORMAL HIGH (ref 9.6–12.3)

## 2023-04-09 NOTE — Progress Notes
 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  The patient was contacted regarding lab drawn INR results.  Patient's INR findings and prescribed dose from last visit:  03/24/2023   9:05 AM 04/02/2023   9:47 AM 04/09/2023   4:31 PM Anticoagulation Monitoring Assoc. INR Date 03/23/2023 04/01/2023 04/09/2023 Associated INR 1.42 4.40 2.57 Instructions 7.5 mg every Mon, Wed, Fri; 5 mg all other days 12/6: Hold; Otherwise 5 mg every day 5 mg every day Sunday Dose 5 mg 5 mg 5 mg Monday Dose 7.5 mg 5 mg 5 mg Tuesday Dose - 5 mg 5 mg Wednesday Dose 7.5 mg 5 mg 5 mg Thursday Dose 5 mg - 5 mg Friday Dose 7.5 mg Hold (12/6) 5 mg Saturday Dose 5 mg 5 mg 5 mg Last 7 day dose total 37.5 mg 37.5 mg 30 mg Next INR Date 03/30/2023 04/08/2023 04/23/2023 The following findings were reported from today's call (no findings if none selected):[]  Missed doses []  Extra doses []  Change in medications []  Change in vitamin k rich food intake (green vegetables, herbals, etc) []  Change in alcohol use []  Change in overall health []  Recent hospitalization / emergency department visit []  Upcoming procedure (including dental procedures) []  Other Comments:       No findings The following side effects were reported from today's call (no side effects if none selected):[]  Bleeding []  New bruising []  Warfarin-emergency department visit or hospital admission []  Other Comments:   No side effects Assessment/Plan:Patient's INR is 2.57, which is within goal range of 2.0-3.0.  She was instructed to continue current weekly dose as indicated in the maintenance plan below.  Next INR assessment due 2 weeks.  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  Electronically Signed by [name], RPH, April 09, 2023 [facility] Anticoagulation Clinic  [address] [place], [place] [address]  Phone: [phone]  Fax: [phone]

## 2023-04-19 ENCOUNTER — Encounter: Admit: 2023-04-19 | Payer: PRIVATE HEALTH INSURANCE | Primary: Internal Medicine

## 2023-04-23 ENCOUNTER — Ambulatory Visit: Admit: 2023-04-23 | Payer: PRIVATE HEALTH INSURANCE | Attending: Hematology & Oncology | Primary: Internal Medicine

## 2023-04-23 ENCOUNTER — Inpatient Hospital Stay: Admit: 2023-04-23 | Discharge: 2023-04-23 | Payer: PRIVATE HEALTH INSURANCE | Primary: Internal Medicine

## 2023-04-23 ENCOUNTER — Encounter: Admit: 2023-04-23 | Payer: PRIVATE HEALTH INSURANCE | Attending: Pharmacotherapy | Primary: Internal Medicine

## 2023-04-23 DIAGNOSIS — Z5181 Encounter for therapeutic drug level monitoring: Secondary | ICD-10-CM

## 2023-04-23 DIAGNOSIS — D6862 Lupus anticoagulant syndrome: Secondary | ICD-10-CM

## 2023-04-23 DIAGNOSIS — Z7901 Long term (current) use of anticoagulants: Secondary | ICD-10-CM

## 2023-04-23 LAB — PROTIME AND INR
BKR INR: 2.25 — ABNORMAL HIGH (ref 0.86–1.13)
BKR PROTHROMBIN TIME: 23.5 s — ABNORMAL HIGH (ref 9.6–12.3)

## 2023-04-26 ENCOUNTER — Ambulatory Visit: Admit: 2023-04-26 | Payer: PRIVATE HEALTH INSURANCE | Primary: Internal Medicine

## 2023-04-26 ENCOUNTER — Encounter: Admit: 2023-04-26 | Payer: PRIVATE HEALTH INSURANCE | Primary: Internal Medicine

## 2023-04-26 DIAGNOSIS — D6862 Lupus anticoagulant syndrome: Secondary | ICD-10-CM

## 2023-04-26 DIAGNOSIS — K55039 Acute (reversible) ischemia of large intestine, extent unspecified: Secondary | ICD-10-CM

## 2023-04-26 DIAGNOSIS — L719 Rosacea, unspecified: Secondary | ICD-10-CM

## 2023-04-26 DIAGNOSIS — R42 Dizziness and giddiness: Secondary | ICD-10-CM

## 2023-04-26 DIAGNOSIS — E7800 Pure hypercholesterolemia, unspecified: Secondary | ICD-10-CM

## 2023-04-26 DIAGNOSIS — E119 Type 2 diabetes mellitus without complications: Secondary | ICD-10-CM

## 2023-04-26 DIAGNOSIS — F32A Depression: Secondary | ICD-10-CM

## 2023-04-26 DIAGNOSIS — K559 Vascular disorder of intestine, unspecified: Secondary | ICD-10-CM

## 2023-04-26 DIAGNOSIS — K436 Other and unspecified ventral hernia with obstruction, without gangrene: Secondary | ICD-10-CM

## 2023-04-26 NOTE — Progress Notes
 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  The patient was contacted regarding lab drawn INR results.  Patient's INR findings and prescribed dose from last visit:  04/02/2023   9:47 AM 04/09/2023   4:31 PM 04/26/2023   9:51 AM Anticoagulation Monitoring Assoc. INR Date 04/01/2023 04/09/2023 04/23/2023 Associated INR 4.40 2.57 2.25 Instructions 12/6: Hold; Otherwise 5 mg every day 5 mg every day 5 mg every day Sunday Dose 5 mg 5 mg 5 mg Monday Dose 5 mg 5 mg 5 mg Tuesday Dose 5 mg 5 mg 5 mg Wednesday Dose 5 mg 5 mg 5 mg Thursday Dose - 5 mg 5 mg Friday Dose Hold (12/6) 5 mg 5 mg Saturday Dose 5 mg 5 mg 5 mg Last 7 day dose total 37.5 mg 30 mg 35 mg Next INR Date 04/08/2023 04/23/2023 05/21/2023 The following findings were reported from today's call (no findings if none selected):[]  Missed doses []  Extra doses []  Change in medications []  Change in vitamin k rich food intake (green vegetables, herbals, etc) []  Change in alcohol use []  Change in overall health []  Recent hospitalization / emergency department visit []  Upcoming procedure (including dental procedures) []  Other Comments:       No findings The following side effects were reported from today's call (no side effects if none selected):[]  Bleeding []  New bruising []  Warfarin-emergency department visit or hospital admission []  Other Comments:   No side effects Assessment/Plan:Patient's INR is 2.25, which is within goal range of 2.0-3.0.  She was instructed to continue current weekly dose as indicated in the maintenance plan below.  Next INR assessment due 4 weeks.  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  Electronically Signed by [name], RPH, April 26, 2023 [facility] Anticoagulation Clinic  [address] [place], [place] [address]  Phone: [phone]  Fax: [phone]

## 2023-04-30 ENCOUNTER — Encounter: Admit: 2023-04-30 | Payer: PRIVATE HEALTH INSURANCE | Attending: Pharmacotherapy | Primary: Internal Medicine

## 2023-04-30 DIAGNOSIS — D6862 Lupus anticoagulant syndrome: Secondary | ICD-10-CM

## 2023-04-30 DIAGNOSIS — Z5181 Encounter for therapeutic drug level monitoring: Secondary | ICD-10-CM

## 2023-05-07 ENCOUNTER — Encounter: Admit: 2023-05-07 | Payer: PRIVATE HEALTH INSURANCE | Attending: Pharmacotherapy | Primary: Internal Medicine

## 2023-05-07 DIAGNOSIS — Z5181 Encounter for therapeutic drug level monitoring: Secondary | ICD-10-CM

## 2023-05-07 DIAGNOSIS — D6862 Lupus anticoagulant syndrome: Secondary | ICD-10-CM

## 2023-05-13 IMAGING — DX DG ABD PORTABLE 1V
1 series · 1 of 1 positions shown · non-contrast
Comparison: Abdominal radiographs obtained earlier the same day

CLINICAL DATA: NG tube placement

EXAM:
PORTABLE ABDOMEN - 1 VIEW

[abdomen supine]
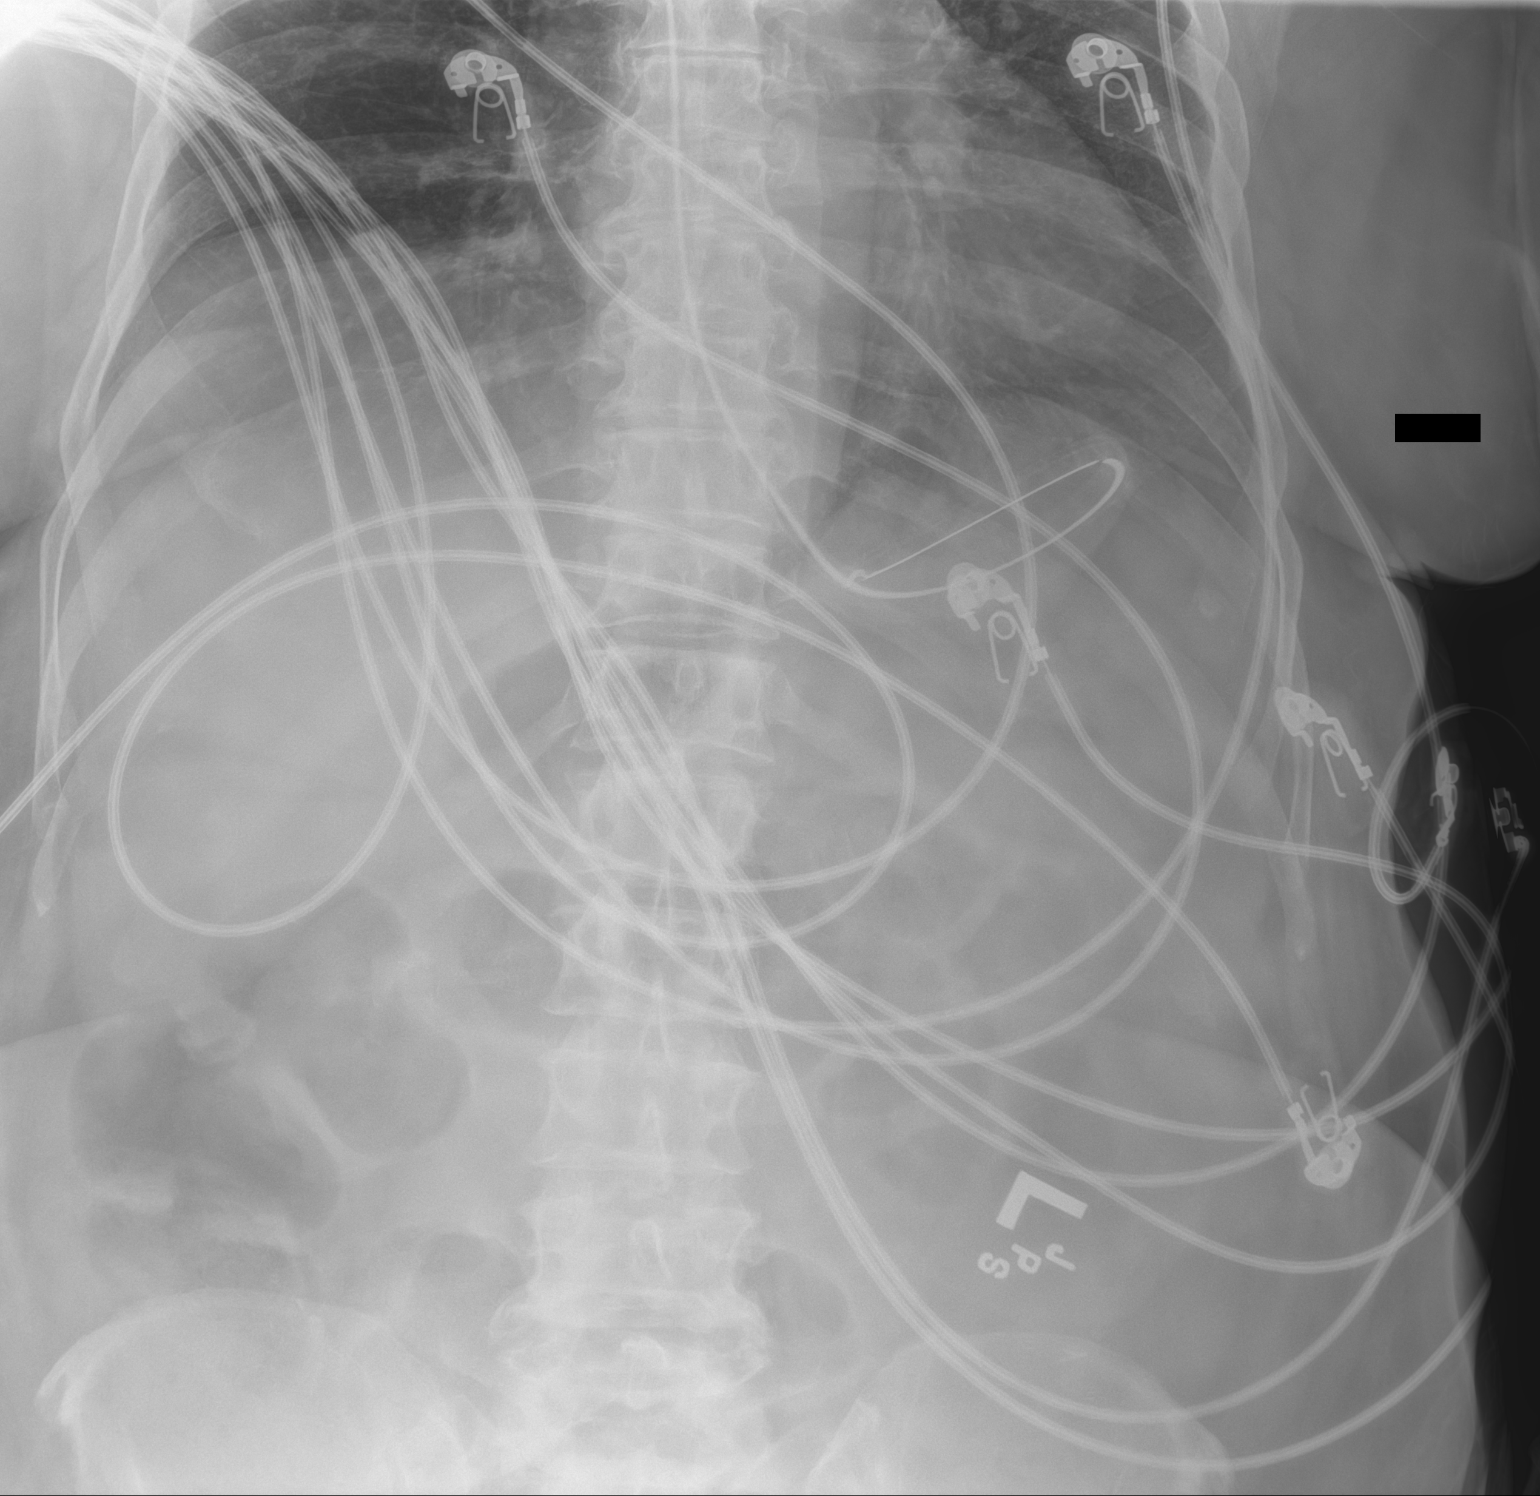

[1 of 1 positions shown; findings below may reference images not displayed]

FINDINGS: The enteric catheter is coiled in the stomach. The tip and side hole
project over the stomach.

There is a nonobstructive bowel gas pattern. There is no gross
organomegaly or abnormal soft tissue calcification. The lung bases
are clear. The bones are unremarkable.
IMPRESSION: NG tube tip and side hole project over the stomach.

## 2023-05-14 ENCOUNTER — Encounter: Admit: 2023-05-14 | Payer: PRIVATE HEALTH INSURANCE | Attending: Pharmacotherapy | Primary: Internal Medicine

## 2023-05-14 DIAGNOSIS — Z5181 Encounter for therapeutic drug level monitoring: Secondary | ICD-10-CM

## 2023-05-14 DIAGNOSIS — D6862 Lupus anticoagulant syndrome: Secondary | ICD-10-CM

## 2023-05-18 ENCOUNTER — Encounter: Admit: 2023-05-18 | Payer: PRIVATE HEALTH INSURANCE | Attending: Hematology & Oncology | Primary: Internal Medicine

## 2023-05-21 ENCOUNTER — Encounter: Admit: 2023-05-21 | Payer: PRIVATE HEALTH INSURANCE | Primary: Internal Medicine

## 2023-05-21 DIAGNOSIS — D6862 Lupus anticoagulant syndrome: Secondary | ICD-10-CM

## 2023-05-21 DIAGNOSIS — Z5181 Encounter for therapeutic drug level monitoring: Secondary | ICD-10-CM

## 2023-05-28 ENCOUNTER — Encounter: Admit: 2023-05-28 | Payer: PRIVATE HEALTH INSURANCE | Primary: Internal Medicine

## 2023-05-28 DIAGNOSIS — Z5181 Encounter for therapeutic drug level monitoring: Secondary | ICD-10-CM

## 2023-05-28 DIAGNOSIS — D6862 Lupus anticoagulant syndrome: Secondary | ICD-10-CM

## 2023-05-31 ENCOUNTER — Inpatient Hospital Stay: Admit: 2023-05-31 | Discharge: 2023-05-31 | Payer: PRIVATE HEALTH INSURANCE | Primary: Internal Medicine

## 2023-05-31 DIAGNOSIS — Z7901 Long term (current) use of anticoagulants: Secondary | ICD-10-CM

## 2023-05-31 DIAGNOSIS — Z5181 Encounter for therapeutic drug level monitoring: Secondary | ICD-10-CM

## 2023-05-31 DIAGNOSIS — D6862 Lupus anticoagulant syndrome: Secondary | ICD-10-CM

## 2023-05-31 LAB — PROTIME AND INR
BKR INR: 3.28 — ABNORMAL HIGH (ref 0.86–1.13)
BKR PROTHROMBIN TIME: 33.4 s — ABNORMAL HIGH (ref 9.6–12.3)

## 2023-06-01 ENCOUNTER — Encounter: Admit: 2023-06-01 | Payer: PRIVATE HEALTH INSURANCE | Primary: Internal Medicine

## 2023-06-01 ENCOUNTER — Ambulatory Visit: Admit: 2023-06-01 | Payer: PRIVATE HEALTH INSURANCE | Primary: Internal Medicine

## 2023-06-01 DIAGNOSIS — D6862 Lupus anticoagulant syndrome: Secondary | ICD-10-CM

## 2023-06-01 DIAGNOSIS — K55039 Acute (reversible) ischemia of large intestine, extent unspecified: Secondary | ICD-10-CM

## 2023-06-01 DIAGNOSIS — L719 Rosacea, unspecified: Secondary | ICD-10-CM

## 2023-06-01 DIAGNOSIS — F32A Depression: Secondary | ICD-10-CM

## 2023-06-01 DIAGNOSIS — K559 Vascular disorder of intestine, unspecified: Secondary | ICD-10-CM

## 2023-06-01 DIAGNOSIS — E119 Type 2 diabetes mellitus without complications: Secondary | ICD-10-CM

## 2023-06-01 DIAGNOSIS — K436 Other and unspecified ventral hernia with obstruction, without gangrene: Secondary | ICD-10-CM

## 2023-06-01 DIAGNOSIS — R42 Dizziness and giddiness: Secondary | ICD-10-CM

## 2023-06-01 DIAGNOSIS — E7800 Pure hypercholesterolemia, unspecified: Secondary | ICD-10-CM

## 2023-06-01 NOTE — Progress Notes
 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  The patient was contacted regarding lab drawn INR results.  Patient's INR findings and prescribed dose from last visit:  04/09/2023   4:31 PM 04/26/2023   9:51 AM 06/01/2023  10:31 AM Anticoagulation Monitoring Assoc. INR Date 04/09/2023 04/23/2023 05/31/2023 Associated INR 2.57 2.25 3.28 Instructions 5 mg every day 5 mg every day 5 mg every day Sunday Dose 5 mg 5 mg 5 mg Monday Dose 5 mg 5 mg 5 mg Tuesday Dose 5 mg 5 mg 5 mg Wednesday Dose 5 mg 5 mg 5 mg Thursday Dose 5 mg 5 mg 5 mg Friday Dose 5 mg 5 mg 5 mg Saturday Dose 5 mg 5 mg 5 mg Last 7 day dose total 30 mg 35 mg 35 mg Next INR Date 04/23/2023 05/21/2023 06/14/2023 The following findings were reported from today's call (no findings if none selected):[]  Missed doses []  Extra doses [x]  Change in medications []  Change in vitamin k rich food intake (green vegetables, herbals, etc) []  Change in alcohol use [x]  Change in overall health []  Recent hospitalization / emergency department visit []  Upcoming procedure (including dental procedures) []  Other Comments:       Patient finished antibiotics on 1/30 for sinus infection The following side effects were reported from today's call (no side effects if none selected):[]  Bleeding []  New bruising []  Warfarin-emergency department visit or hospital admission []  Other Comments:   No side effects Assessment/Plan:Patient's INR is 3.28, which is above goal range of 2.0-3.0.  Patient finished antibiotics on 1/30 for sinus infection, she does not remember which antibiotics it was.  She was instructed to continue current weekly dose as indicated in the maintenance plan below.  Next INR assessment due 2 weeks.  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  Electronically Signed by [name], RPH, June 01, 2023 [facility] Anticoagulation Clinic  [address] [place], [place] [address]  Phone: [phone]  Fax: [phone]

## 2023-06-11 ENCOUNTER — Encounter: Admit: 2023-06-11 | Payer: PRIVATE HEALTH INSURANCE | Primary: Internal Medicine

## 2023-06-11 DIAGNOSIS — Z5181 Encounter for therapeutic drug level monitoring: Secondary | ICD-10-CM

## 2023-06-11 DIAGNOSIS — D6862 Lupus anticoagulant syndrome: Secondary | ICD-10-CM

## 2023-06-18 ENCOUNTER — Encounter: Admit: 2023-06-18 | Payer: PRIVATE HEALTH INSURANCE | Primary: Internal Medicine

## 2023-06-18 DIAGNOSIS — Z5181 Encounter for therapeutic drug level monitoring: Secondary | ICD-10-CM

## 2023-06-18 DIAGNOSIS — D6862 Lupus anticoagulant syndrome: Secondary | ICD-10-CM

## 2023-06-22 ENCOUNTER — Telehealth: Admit: 2023-06-22 | Payer: PRIVATE HEALTH INSURANCE | Attending: Ophthalmology | Primary: Internal Medicine

## 2023-06-22 NOTE — Telephone Encounter
 Returned tc- C/o of dry eyes, gritty fbs, mild/mod heaviness to eyelids, itching- Onset ongoing past few days-Denied visual changes, pain, redness, photophobia flashes/floater, decreased vision, curtain of darkness/veil, shadow vision-Currently using Pataday-Offered no recent surgery or injury-Pt advised to START lid scrubs and compresses-Education provided as per care adviceBlepharitis Education/Care Advice:An inflammation of the eyelids. This occurs due to infection at the base of eyelashes or when the tiny oil glands at the base of the eyelashes become clogged.Common symptoms of blepharitis ZOX:WRUEAVW like there?s something in your eyeAppearance of extra skin or eyelid hanging lowBurning or stinging eyesWatery eyesItchy eyesSensitivity to lightRed and swollen eyes or eyelidsTears that are foamy or have small bubbles in themDry eyesCrusty eyelids or eyelashes when you wake up Began the following Regimen:Artificial Tears(Refresh or Systane) instill 1 drop into affected eye 4 times a day-For eye itchiness, pain, burning or inflammation apply cool compresses 2-3 times a day for 5 to 10 minutes-For dry eyes, blurred vision, redness or tearing apply warm compresses 2-3 times a day for 5 to 10 minutes-EyeLid Hygiene: with a clean face cloth, damp with warm water, place a small amount of ([employer]) baby shampoo on wash cloth and suds, with your eyes closed wash your upper/lower eyelids/lashes, then rinse with cool water and pat dry. Do this every morning upon awakening and before bed-  If symptoms are no better after 4-6 weeks of treatment, I recommend an evaluation in the office. Progress Gordy Councilman, MD at 10/30/2022  9:15 AMStatus: Signed Allergic conjunctivitis ou -  on Pataday, discussed using pataday consistently and artificial tears PRN. Don't use Visine. 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 I discussed the case with the resident and agree with the findings and plan as documented in the resident's note.  I personally examined the patient and formulated the plan. I personally discussed with patient and answered all questions.Diabetes mellitus no retinopath

## 2023-06-22 NOTE — Telephone Encounter
 Copied from CRM #[id number]. Topic: General Message - [facility]>> Jun 22, 2023  1:29 PM Karl Pock wrote:[facility] MESSAGETime of call:   1:29 PMCaller:   [place], SOFIACaller's relationship to patient:  Self Specialist you are calling for:  Dr. Betsy Coder Reason for call:   The patient says her left eye feel heavy, she is experiencing blurry vision and it feels like something is in it.  She is using her drops and it is not helping If not feeling well, what are symptoms:  blurry vision, feeling like something is in her left eye and a heavy feeling and excessive tearing as well    If having symptoms, how long have the symptoms been present:  3 weeks - getting worse    Does caller request to speak to someone urgently?  yes   Best telephone number for callback:   [phone] Best time to return call:   any  Permission to leave message:  yes   [facility]

## 2023-06-25 ENCOUNTER — Encounter: Admit: 2023-06-25 | Payer: PRIVATE HEALTH INSURANCE | Primary: Internal Medicine

## 2023-06-25 DIAGNOSIS — D6862 Lupus anticoagulant syndrome: Secondary | ICD-10-CM

## 2023-06-25 DIAGNOSIS — Z5181 Encounter for therapeutic drug level monitoring: Secondary | ICD-10-CM

## 2023-06-28 ENCOUNTER — Telehealth: Admit: 2023-06-28 | Payer: PRIVATE HEALTH INSURANCE | Primary: Internal Medicine

## 2023-06-28 NOTE — Telephone Encounter
 INR REMINDER MISSED CALL	I have left a voicemail for [name] on 06/28/2023 at 10:04 AM with a reminder that patient is due for an INR lab test based on the referral for anticoagulation monitoring. This is the first week of INR reminder calls. I have notified the pharmacist as appropriate.[name], RPHAmbulatory Care AssociateSmilow [facility] Center3/3/202510:04 AM

## 2023-07-02 ENCOUNTER — Encounter: Admit: 2023-07-02 | Payer: PRIVATE HEALTH INSURANCE | Primary: Internal Medicine

## 2023-07-02 DIAGNOSIS — Z5181 Encounter for therapeutic drug level monitoring: Secondary | ICD-10-CM

## 2023-07-02 DIAGNOSIS — D6862 Lupus anticoagulant syndrome: Secondary | ICD-10-CM

## 2023-07-08 ENCOUNTER — Ambulatory Visit: Admit: 2023-07-08 | Payer: PRIVATE HEALTH INSURANCE | Attending: Hematology & Oncology | Primary: Internal Medicine

## 2023-07-08 ENCOUNTER — Encounter: Admit: 2023-07-08 | Payer: PRIVATE HEALTH INSURANCE | Primary: Internal Medicine

## 2023-07-08 ENCOUNTER — Ambulatory Visit: Admit: 2023-07-08 | Payer: PRIVATE HEALTH INSURANCE | Primary: Internal Medicine

## 2023-07-08 ENCOUNTER — Encounter: Admit: 2023-07-08 | Payer: PRIVATE HEALTH INSURANCE | Attending: Hematology & Oncology | Primary: Internal Medicine

## 2023-07-08 ENCOUNTER — Inpatient Hospital Stay: Admit: 2023-07-08 | Discharge: 2023-07-08 | Payer: PRIVATE HEALTH INSURANCE | Primary: Internal Medicine

## 2023-07-08 VITALS — BP 143/78 | HR 87 | Temp 98.00000°F | Resp 18 | Wt 160.4 lb

## 2023-07-08 DIAGNOSIS — E119 Type 2 diabetes mellitus without complications: Secondary | ICD-10-CM

## 2023-07-08 DIAGNOSIS — Z5181 Encounter for therapeutic drug level monitoring: Secondary | ICD-10-CM

## 2023-07-08 DIAGNOSIS — D6862 Lupus anticoagulant syndrome: Secondary | ICD-10-CM

## 2023-07-08 DIAGNOSIS — K436 Other and unspecified ventral hernia with obstruction, without gangrene: Secondary | ICD-10-CM

## 2023-07-08 DIAGNOSIS — L719 Rosacea, unspecified: Secondary | ICD-10-CM

## 2023-07-08 DIAGNOSIS — Z86718 Personal history of other venous thrombosis and embolism: Secondary | ICD-10-CM

## 2023-07-08 DIAGNOSIS — Z7901 Long term (current) use of anticoagulants: Secondary | ICD-10-CM

## 2023-07-08 DIAGNOSIS — K55039 Acute (reversible) ischemia of large intestine, extent unspecified: Secondary | ICD-10-CM

## 2023-07-08 DIAGNOSIS — K559 Vascular disorder of intestine, unspecified: Secondary | ICD-10-CM

## 2023-07-08 DIAGNOSIS — F32A Depression: Secondary | ICD-10-CM

## 2023-07-08 DIAGNOSIS — E7800 Pure hypercholesterolemia, unspecified: Secondary | ICD-10-CM

## 2023-07-08 DIAGNOSIS — R42 Dizziness and giddiness: Secondary | ICD-10-CM

## 2023-07-08 LAB — CBC WITH AUTO DIFFERENTIAL
BKR WAM ABSOLUTE IMMATURE GRANULOCYTES.: 0.03 x 1000/ÂµL (ref 0.00–0.30)
BKR WAM ABSOLUTE LYMPHOCYTE COUNT.: 1.44 x 1000/ÂµL (ref 0.60–3.70)
BKR WAM ABSOLUTE NRBC (2 DEC): 0 x 1000/ÂµL (ref 0.00–1.00)
BKR WAM ANC (ABSOLUTE NEUTROPHIL COUNT): 8.63 x 1000/ÂµL — ABNORMAL HIGH (ref 2.00–7.60)
BKR WAM BASOPHIL ABSOLUTE COUNT.: 0.05 x 1000/ÂµL (ref 0.00–1.00)
BKR WAM BASOPHILS: 0.5 % (ref 0.0–1.4)
BKR WAM EOSINOPHIL ABSOLUTE COUNT.: 0.17 x 1000/ÂµL (ref 0.00–1.00)
BKR WAM EOSINOPHILS: 1.6 % (ref 0.0–5.0)
BKR WAM HEMATOCRIT (2 DEC): 40.9 % (ref 35.00–45.00)
BKR WAM HEMOGLOBIN: 12.8 g/dL (ref 11.7–15.5)
BKR WAM IMMATURE GRANULOCYTES: 0.3 % (ref 0.0–1.0)
BKR WAM LYMPHOCYTES: 13.3 % — ABNORMAL LOW (ref 17.0–50.0)
BKR WAM MCH (PG): 25.5 pg — ABNORMAL LOW (ref 27.0–33.0)
BKR WAM MCHC: 31.3 g/dL (ref 31.0–36.0)
BKR WAM MCV: 81.6 fL (ref 80.0–100.0)
BKR WAM MONOCYTE ABSOLUTE COUNT.: 0.52 x 1000/ÂµL (ref 0.00–1.00)
BKR WAM MONOCYTES: 4.8 % (ref 4.0–12.0)
BKR WAM MPV: 11.4 fL (ref 8.0–12.0)
BKR WAM NEUTROPHILS: 79.5 % — ABNORMAL HIGH (ref 39.0–72.0)
BKR WAM NUCLEATED RED BLOOD CELLS: 0 % (ref 0.0–1.0)
BKR WAM PLATELETS: 358 x1000/ÂµL (ref 150–420)
BKR WAM RDW-CV: 16.2 % — ABNORMAL HIGH (ref 11.0–15.0)
BKR WAM RED BLOOD CELL COUNT.: 5.01 M/ÂµL (ref 4.00–6.00)
BKR WAM WHITE BLOOD CELL COUNT: 10.8 x1000/ÂµL (ref 4.0–11.0)

## 2023-07-08 LAB — COMPREHENSIVE METABOLIC PANEL
BKR A/G RATIO: 1.1 (ref 1.0–2.2)
BKR ALANINE AMINOTRANSFERASE (ALT): 15 U/L (ref 10–35)
BKR ALBUMIN: 4 g/dL (ref 3.6–5.1)
BKR ALKALINE PHOSPHATASE: 128 U/L — ABNORMAL HIGH (ref 9–122)
BKR ANION GAP: 9 (ref 7–17)
BKR ASPARTATE AMINOTRANSFERASE (AST): 16 U/L (ref 10–35)
BKR AST/ALT RATIO: 1.1
BKR BILIRUBIN TOTAL: 0.4 mg/dL (ref <=1.2)
BKR BLOOD UREA NITROGEN: 18 mg/dL (ref 8–23)
BKR BUN / CREAT RATIO: 23.1 — ABNORMAL HIGH (ref 8.0–23.0)
BKR CALCIUM: 9.2 mg/dL (ref 8.8–10.2)
BKR CHLORIDE: 100 mmol/L (ref 98–107)
BKR CO2: 28 mmol/L (ref 20–30)
BKR CREATININE DELTA: 0.08
BKR CREATININE: 0.78 mg/dL (ref 0.40–1.30)
BKR EGFR, CREATININE (CKD-EPI 2021): >60 mL/min/{1.73_m2} (ref >=60)
BKR GLOBULIN: 3.6 g/dL (ref 2.0–3.9)
BKR GLUCOSE: 215 mg/dL — ABNORMAL HIGH (ref 70–100)
BKR POTASSIUM: 4.5 mmol/L (ref 3.3–5.3)
BKR PROTEIN TOTAL: 7.6 g/dL (ref 5.9–8.3)
BKR SODIUM: 137 mmol/L (ref 136–144)

## 2023-07-08 LAB — PROTIME AND INR
BKR INR: 2.67 — ABNORMAL HIGH (ref 0.89–1.14)
BKR PROTHROMBIN TIME: 26.6 s — ABNORMAL HIGH (ref 9.4–11.9)

## 2023-07-08 NOTE — Progress Notes
 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  The patient was contacted regarding lab drawn INR results.  Patient's INR findings and prescribed dose from last visit:  04/26/2023   9:51 AM 06/01/2023  10:31 AM 07/08/2023  12:34 PM Anticoagulation Monitoring Assoc. INR Date 04/23/2023 05/31/2023 07/08/2023 Associated INR 2.25 3.28 2.67 Instructions 5 mg every day 5 mg every day 5 mg every day Sunday Dose 5 mg 5 mg 5 mg Monday Dose 5 mg 5 mg 5 mg Tuesday Dose 5 mg 5 mg 5 mg Wednesday Dose 5 mg 5 mg 5 mg Thursday Dose 5 mg 5 mg 5 mg Friday Dose 5 mg 5 mg 5 mg Saturday Dose 5 mg 5 mg 5 mg Last 7 day dose total 35 mg 35 mg 35 mg Next INR Date 05/21/2023 06/14/2023 08/05/2023 The following findings were reported from today's call (no findings if none selected):[]  Missed doses []  Extra doses []  Change in medications []  Change in vitamin k rich food intake (green vegetables, herbals, etc) []  Change in alcohol use []  Change in overall health []  Recent hospitalization / emergency department visit []  Upcoming procedure (including dental procedures) []  Other Comments:       No findings The following side effects were reported from today's call (no side effects if none selected):[]  Bleeding []  New bruising []  Warfarin-emergency department visit or hospital admission []  Other Comments:   No side effects Assessment/Plan:Patient's INR is 2.67, which is within goal range of 2.0-3.0.  She was instructed to continue current weekly dose as indicated in the maintenance plan below.  Next INR assessment due 4 weeks.  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  Electronically Signed by [name], RPH, July 08, 2023 [facility] Anticoagulation Clinic  [address] [place], [place] [address]  Phone: [phone]  Fax: [phone]

## 2023-07-08 NOTE — Progress Notes
 Office Visit - Consultation  Referring Physician: Dr Charlett Nose for initial consult: + lupus anticoagulant, hx of anticoagulationSubjective History of Present Illness/Interval History  Compliant with warfarinDenies abdominal pain, bleeding, cp, sob, leg painMedications: Medications reviewed and reconciledCurrent Outpatient Medications:   acetaminophen, Take 1 tablet (500 mg total) by mouth every 4 (four) hours as needed.  biotin, Take 1 tablet (1,000 mcg total) by mouth daily.  FreeStyle Libre 3 Reader, Use as directed.  FreeStyle Libre 3 Sensor, Use as directed.  cetirizine, Take 1 tablet (10 mg total) by mouth daily. (Patient not taking: Reported on 06/30/2023)  cholecalciferol (vitamin D3), Take 1 tablet (5,000 Units total) by mouth every other day.  diphenhydramine HCl (BENADRYL ALLERGY ORAL), Take 1 tablet by mouth as needed.  emollient, Apply topically 2 (two) times daily. (Patient not taking: Reported on 06/30/2023)  enoxaparin, Inject 1 mL (100 mg total) under the skin daily. (Patient not taking: Reported on 06/30/2023)  gabapentin, Take 1 capsule (300 mg total) by mouth nightly.  glipiZIDE, TAKE 1 TABLET BY MOUTH ONCE DAILY BEFORE BREAKFAST  insulin glargine, Inject 10 Units under the skin nightly.  insulin lispro, Inject 2 units with smaller carb meals and 4 units with larger carb meals  insulin pen needle, Use with insulin pen up to 4 times per day.  Jardiance, Take 1 tablet (25 mg total) by mouth daily.  losartan, Take 1 tablet (25 mg total) by mouth daily.  meclizine, Take 1 tablet (12.5 mg total) by mouth daily as needed for Dizziness  melatonin, Take 10 mg by mouth nightly as needed.   multivitamin, Take 1 tablet by mouth daily.  olopatadine, Place 1 drop into both eyes daily.  ondansetron, Take 1 tablet (4 mg total) by mouth every 8 (eight) hours as needed for nausea.  OneTouch Delica Plus Lancet,   OneTouch Ultra Test,   [employer], Use to check blood sugar 1 times per day (Type 2 Diabetes: E11.9).  vitamin k, Take 1 tablet (100 mcg total) by mouth daily.  polyethylene glycol, Take 1 packet (17 g total) by mouth daily. Mix in 8 ounces of water, juice, soda, coffee or tea prior to taking. (Patient not taking: Reported on 06/30/2023)  pravastatin, Take 1.5 tablets (60 mg total) by mouth nightly.  QUEtiapine, Take 1 tablet (50 mg total) by mouth nightly.  senna, Take 2 tablets by mouth nightly.[name]  SITagliptin phosphate, Take 1 tablet (100 mg total) by mouth daily.  triamcinolone, Apply topically 2 (two) times daily. (Patient not taking: Reported on 06/30/2023)  venlafaxine XR, Take 1 capsule (150 mg total) by mouth daily.  warfarin, Take 1 tablet (5 mg total) by mouth daily. Take by mouth 7.5 mg (5 mg x 1.5) every Fri; 5 mg (5 mg x 1) all other daysAllergies:Morphine; Biaxin  [clarithromycin]; Latex, natural rubber; Mitosol [mitomycin]; and OxycodoneImmunization History Administered Date(s) Administered  Influenza, high dose, quad, 0.7 mL, preservative free 01/17/2019  Influenza, high-dose, split virus, trivalent,(73Yr+),injectable, preservative free 01/02/2017, 03/17/2018  Influenza, injectable, quadrivalent, preservative free 02/23/2014  Influenza, trivalent, injectable, contains preservative 01/17/2021  Pneumococcal polysaccharide PPSV23 02/23/2014, 12/27/2017  Tdap 11/26/2018 Past Medical History Past Surgical History She has a past medical history of Acute ischemic colitis (HC Code) (HC CODE) (HC Code), Depression, Diabetes mellitus (HC Code), Hypercholesteremia, Lupus anticoagulant disorder (HC Code), Rosacea, Ventral hernia with bowel obstruction, and Vertigo.  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 Social History Family History She reports that she has never smoked. She has never used smokeless tobacco. She reports that she does not currently use alcohol after a past usage of about 1.0 standard drink of alcohol per week. She reports that she does not use drugs.  Cancer-related family history includes Bladder cancer in her mother; Breast cancer in her maternal cousin; Colon cancer in her father, maternal cousin, and paternal aunt. Herfamily history includes Bladder cancer in her mother; Breast cancer in her maternal cousin; Colon cancer in her father, maternal cousin, and paternal aunt; Diabetes in her father, paternal uncle, and sister.  Objective Physical Examination: Vitals:  07/08/23 1104 BP: (!) 143/78 Pulse: 87 Resp: 18 Temp: 98 ?F (36.7 ?C) TempSrc: Temporal SpO2: 99% Weight: 72.8 kg BMI Readings from Last 3 Encounters: 07/08/23 33.52 kg/m? 06/30/23 34.49 kg/m? 01/07/23 32.81 kg/m?  General: No Acute Distress, ambulates with canePulmonary: CTAB; Normal effort; No wheezes/rhonchi/rales Cardiac: normal S1, normal S2, RRR, no M/R/G Abdomen: Soft, non-tender, no HSM, well healed surgical scars, bruisingPsychiatric: Appropriate, Alert and Oriented x3 Extremities: No EdemaLabs, Imaging, Misc:  [facility] Results Component Value Date  WBC 10.8 07/08/2023  HGB 12.8 07/08/2023  HCT 40.90 07/08/2023  MCV 81.6 07/08/2023  PLT 358 07/08/2023  ANCANC 8.63 (H) 07/08/2023 Lab Results Component Value Date  NA 137 07/08/2023  K 4.5 07/08/2023  CL 100 07/08/2023  CO2 28 07/08/2023  BUN 18 07/08/2023  CREATININE 0.78 07/08/2023  GLU 215 (H) 07/08/2023  ALBUMIN 4.0 07/08/2023 Lab Results Component Value Date  ALT 15 07/08/2023  AST 16 07/08/2023  ALKPHOS 128 (H) 07/08/2023  BILITOT 0.4 07/08/2023 Orders Placed This Encounter Procedures  CBC auto differential  Comprehensive metabolic panel  CBC and differential  CMP Assessment  Impression and PlanSofia [name] is a 73 y.o.-year-old femaleTransfer of careHx of + lupus anticoagulant, on warfarin, hx of ischemic colitis. Has recurrent bowel obstructionsNo bleedingSometimes sobCheck INR today - 2.67 . At goal. Today: All questions answered in detail.  Cherylynn Ridges, MD

## 2023-07-09 ENCOUNTER — Encounter: Admit: 2023-07-09 | Payer: PRIVATE HEALTH INSURANCE | Primary: Internal Medicine

## 2023-07-09 DIAGNOSIS — D6862 Lupus anticoagulant syndrome: Secondary | ICD-10-CM

## 2023-07-09 DIAGNOSIS — Z5181 Encounter for therapeutic drug level monitoring: Secondary | ICD-10-CM

## 2023-07-16 ENCOUNTER — Encounter: Admit: 2023-07-16 | Payer: PRIVATE HEALTH INSURANCE | Primary: Internal Medicine

## 2023-07-16 DIAGNOSIS — Z5181 Encounter for therapeutic drug level monitoring: Secondary | ICD-10-CM

## 2023-07-16 DIAGNOSIS — D6862 Lupus anticoagulant syndrome: Secondary | ICD-10-CM

## 2023-07-23 ENCOUNTER — Encounter: Admit: 2023-07-23 | Payer: PRIVATE HEALTH INSURANCE | Primary: Internal Medicine

## 2023-07-23 DIAGNOSIS — D6862 Lupus anticoagulant syndrome: Secondary | ICD-10-CM

## 2023-07-23 DIAGNOSIS — Z5181 Encounter for therapeutic drug level monitoring: Secondary | ICD-10-CM

## 2023-07-30 ENCOUNTER — Encounter: Admit: 2023-07-30 | Payer: PRIVATE HEALTH INSURANCE | Primary: Internal Medicine

## 2023-07-30 DIAGNOSIS — D6862 Lupus anticoagulant syndrome: Secondary | ICD-10-CM

## 2023-07-30 DIAGNOSIS — Z5181 Encounter for therapeutic drug level monitoring: Secondary | ICD-10-CM

## 2023-08-06 ENCOUNTER — Encounter: Admit: 2023-08-06 | Payer: PRIVATE HEALTH INSURANCE | Primary: Internal Medicine

## 2023-08-06 DIAGNOSIS — Z5181 Encounter for therapeutic drug level monitoring: Secondary | ICD-10-CM

## 2023-08-06 DIAGNOSIS — D6862 Lupus anticoagulant syndrome: Secondary | ICD-10-CM

## 2023-08-13 ENCOUNTER — Encounter: Admit: 2023-08-13 | Payer: PRIVATE HEALTH INSURANCE | Primary: Internal Medicine

## 2023-08-13 DIAGNOSIS — D6862 Lupus anticoagulant syndrome: Secondary | ICD-10-CM

## 2023-08-13 DIAGNOSIS — Z5181 Encounter for therapeutic drug level monitoring: Secondary | ICD-10-CM

## 2023-08-18 ENCOUNTER — Encounter: Admit: 2023-08-18 | Payer: PRIVATE HEALTH INSURANCE | Attending: Oncology | Primary: Internal Medicine

## 2023-08-18 DIAGNOSIS — D6862 Lupus anticoagulant syndrome: Secondary | ICD-10-CM

## 2023-08-20 ENCOUNTER — Encounter: Admit: 2023-08-20 | Payer: PRIVATE HEALTH INSURANCE | Primary: Internal Medicine

## 2023-08-20 DIAGNOSIS — Z5181 Encounter for therapeutic drug level monitoring: Secondary | ICD-10-CM

## 2023-08-20 DIAGNOSIS — D6862 Lupus anticoagulant syndrome: Secondary | ICD-10-CM

## 2023-08-23 ENCOUNTER — Encounter: Admit: 2023-08-23 | Payer: PRIVATE HEALTH INSURANCE | Primary: Internal Medicine

## 2023-08-27 ENCOUNTER — Encounter: Admit: 2023-08-27 | Payer: PRIVATE HEALTH INSURANCE | Primary: Internal Medicine

## 2023-08-27 DIAGNOSIS — D6862 Lupus anticoagulant syndrome: Secondary | ICD-10-CM

## 2023-08-27 DIAGNOSIS — Z5181 Encounter for therapeutic drug level monitoring: Secondary | ICD-10-CM

## 2023-09-01 ENCOUNTER — Inpatient Hospital Stay: Admit: 2023-09-01 | Discharge: 2023-09-01 | Payer: Medicare (Managed Care) | Primary: Internal Medicine

## 2023-09-01 ENCOUNTER — Ambulatory Visit: Admit: 2023-09-01 | Payer: Medicare (Managed Care) | Attending: Hematology & Oncology | Primary: Internal Medicine

## 2023-09-01 DIAGNOSIS — Z5181 Encounter for therapeutic drug level monitoring: Secondary | ICD-10-CM

## 2023-09-01 DIAGNOSIS — Z7901 Long term (current) use of anticoagulants: Secondary | ICD-10-CM

## 2023-09-01 DIAGNOSIS — D6862 Lupus anticoagulant syndrome: Secondary | ICD-10-CM

## 2023-09-01 LAB — PROTIME AND INR
BKR INR: 3.34 — ABNORMAL HIGH (ref 0.86–1.13)
BKR PROTHROMBIN TIME: 34 s — ABNORMAL HIGH (ref 9.6–12.3)

## 2023-09-02 ENCOUNTER — Ambulatory Visit: Admit: 2023-09-02 | Payer: PRIVATE HEALTH INSURANCE | Primary: Internal Medicine

## 2023-09-02 DIAGNOSIS — D6862 Lupus anticoagulant syndrome: Principal | ICD-10-CM

## 2023-09-02 DIAGNOSIS — K559 Vascular disorder of intestine, unspecified: Secondary | ICD-10-CM

## 2023-09-02 NOTE — Progress Notes
 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  The patient was contacted regarding lab drawn INR results.  Patient's INR findings and prescribed dose from last visit:  06/01/2023  10:31 AM 07/08/2023  12:34 PM 09/02/2023   9:27 AM Anticoagulation Monitoring Assoc. INR Date 05/31/2023 07/08/2023 09/01/2023 Associated INR 3.28 2.67 3.34 Instructions 5 mg every day 5 mg every day 5 mg every day Sunday Dose 5 mg 5 mg 5 mg Monday Dose 5 mg 5 mg 5 mg Tuesday Dose 5 mg 5 mg 5 mg Wednesday Dose 5 mg 5 mg 5 mg Thursday Dose 5 mg 5 mg 5 mg Friday Dose 5 mg 5 mg 5 mg Saturday Dose 5 mg 5 mg 5 mg Last 7 day dose total 35 mg 35 mg 35 mg Next INR Date 06/14/2023 08/05/2023 09/22/2023 The following findings were reported from today's call (no findings if none selected):[]  Missed doses []  Extra doses [x]  Change in medications []  Change in vitamin k rich food intake (green vegetables, herbals, etc) []  Change in alcohol use []  Change in overall health []  Recent hospitalization / emergency department visit []  Upcoming procedure (including dental procedures) []  Other Comments:       Patient has been taking Tylenol  The following side effects were reported from today's call (no side effects if none selected):[]  Bleeding []  New bruising []  Warfarin-emergency department visit or hospital admission []  Other Comments:   No side effects Assessment/Plan:Patient's INR is 3.3, which is above goal range of 2.0-3.0.  Patient has been taking Tylenol .  She was instructed to continue current weekly dose as indicated in the maintenance plan below.  Next INR assessment due  09/22/23 .  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  Electronically Signed by Humberto Magnus, RPH, Sep 02, 2023 [facility] Anticoagulation Clinic  [address] [place], [place] [address]  Phone: [phone]  Fax: [phone]

## 2023-09-03 ENCOUNTER — Encounter: Admit: 2023-09-03 | Payer: PRIVATE HEALTH INSURANCE | Attending: Hematology & Oncology | Primary: Internal Medicine

## 2023-09-03 ENCOUNTER — Encounter: Admit: 2023-09-03 | Payer: PRIVATE HEALTH INSURANCE | Primary: Internal Medicine

## 2023-09-03 DIAGNOSIS — Z5181 Encounter for therapeutic drug level monitoring: Secondary | ICD-10-CM

## 2023-09-03 DIAGNOSIS — Z86718 Personal history of other venous thrombosis and embolism: Secondary | ICD-10-CM

## 2023-09-03 DIAGNOSIS — D6862 Lupus anticoagulant syndrome: Secondary | ICD-10-CM

## 2023-09-08 ENCOUNTER — Encounter
Admit: 2023-09-08 | Payer: PRIVATE HEALTH INSURANCE | Attending: Student in an Organized Health Care Education/Training Program | Primary: Internal Medicine

## 2023-09-10 ENCOUNTER — Encounter: Admit: 2023-09-10 | Payer: PRIVATE HEALTH INSURANCE | Primary: Internal Medicine

## 2023-09-10 DIAGNOSIS — D6862 Lupus anticoagulant syndrome: Secondary | ICD-10-CM

## 2023-09-10 DIAGNOSIS — Z5181 Encounter for therapeutic drug level monitoring: Secondary | ICD-10-CM

## 2023-09-17 ENCOUNTER — Encounter: Admit: 2023-09-17 | Payer: PRIVATE HEALTH INSURANCE | Primary: Internal Medicine

## 2023-09-17 DIAGNOSIS — Z5181 Encounter for therapeutic drug level monitoring: Secondary | ICD-10-CM

## 2023-09-17 DIAGNOSIS — D6862 Lupus anticoagulant syndrome: Secondary | ICD-10-CM

## 2023-09-22 ENCOUNTER — Encounter: Admit: 2023-09-22 | Payer: PRIVATE HEALTH INSURANCE | Attending: Oncology | Primary: Internal Medicine

## 2023-09-22 DIAGNOSIS — D6862 Lupus anticoagulant syndrome: Secondary | ICD-10-CM

## 2023-09-24 ENCOUNTER — Encounter: Admit: 2023-09-24 | Payer: PRIVATE HEALTH INSURANCE | Primary: Internal Medicine

## 2023-09-24 DIAGNOSIS — D6862 Lupus anticoagulant syndrome: Secondary | ICD-10-CM

## 2023-09-24 DIAGNOSIS — Z5181 Encounter for therapeutic drug level monitoring: Secondary | ICD-10-CM

## 2023-10-01 ENCOUNTER — Inpatient Hospital Stay: Admit: 2023-10-01 | Discharge: 2023-10-01 | Payer: Medicare (Managed Care) | Primary: Internal Medicine

## 2023-10-01 ENCOUNTER — Encounter: Admit: 2023-10-01 | Payer: PRIVATE HEALTH INSURANCE | Primary: Internal Medicine

## 2023-10-01 DIAGNOSIS — Z5181 Encounter for therapeutic drug level monitoring: Secondary | ICD-10-CM

## 2023-10-01 DIAGNOSIS — D6862 Lupus anticoagulant syndrome: Secondary | ICD-10-CM

## 2023-10-01 DIAGNOSIS — Z7901 Long term (current) use of anticoagulants: Secondary | ICD-10-CM

## 2023-10-01 LAB — PROTIME AND INR
BKR INR: 1.12 (ref 0.86–1.13)
BKR PROTHROMBIN TIME: 12.2 s (ref 9.6–12.3)

## 2023-10-04 ENCOUNTER — Encounter: Admit: 2023-10-04 | Payer: PRIVATE HEALTH INSURANCE | Primary: Internal Medicine

## 2023-10-04 ENCOUNTER — Ambulatory Visit: Admit: 2023-10-04 | Payer: PRIVATE HEALTH INSURANCE | Primary: Internal Medicine

## 2023-10-04 ENCOUNTER — Telehealth: Admit: 2023-10-04 | Payer: PRIVATE HEALTH INSURANCE | Primary: Internal Medicine

## 2023-10-04 DIAGNOSIS — K559 Vascular disorder of intestine, unspecified: Secondary | ICD-10-CM

## 2023-10-04 DIAGNOSIS — D6862 Lupus anticoagulant syndrome: Secondary | ICD-10-CM

## 2023-10-04 NOTE — Telephone Encounter
 Tried calling patients son who is emergency contact but his number was changed.Keyna Blizard, RPHClinical Pharmacist, Anticoagulation ClinicSmilow [facility]

## 2023-10-04 NOTE — Progress Notes
 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  The patient was contacted regarding lab drawn INR results.  Patient's INR findings and prescribed dose from last visit:  07/08/2023  12:34 PM 09/02/2023   9:27 AM 10/04/2023  10:22 AM Anticoagulation Monitoring Assoc. INR Date 07/08/2023 09/01/2023 10/01/2023 Associated INR 2.67 3.34 1.12 Instructions 5 mg every day 5 mg every day 6/11: 7.5 mg; Otherwise 7.5 mg every Mon, Tue; 5 mg all other days Sunday Dose 5 mg 5 mg - Monday Dose 5 mg 5 mg 7.5 mg Tuesday Dose 5 mg 5 mg 7.5 mg Wednesday Dose 5 mg 5 mg 7.5 mg (6/11) Thursday Dose 5 mg 5 mg - Friday Dose 5 mg 5 mg - Saturday Dose 5 mg 5 mg - Last 7 day dose total 35 mg 35 mg 35 mg Next INR Date 08/05/2023 09/22/2023 10/07/2023 The following findings were reported from today's call (no findings if none selected):[]  Missed doses []  Extra doses []  Change in medications []  Change in vitamin k rich food intake (green vegetables, herbals, etc) []  Change in alcohol use []  Change in overall health []  Recent hospitalization / emergency department visit []  Upcoming procedure (including dental procedures) []  Other Comments:       No findings The following side effects were reported from today's call (no side effects if none selected):[]  Bleeding []  New bruising []  Warfarin-emergency department visit or hospital admission []  Other Comments:   No side effects Assessment/Plan:Patient's INR is 1.12, which is below goal range of 2.0-3.0.  Patient confirmed she is taking warfarin 5 mg daily but has had recent difficulty with her diabetes and has been traveling/eating different since her mother in law just passed away. Dr. Gaylyn Keas would like dose escalation without Lovenox  bridgeShe was instructed to take warfarin 15 mg daily as indicated in the maintenance plan below.  Next INR assessment due 10/07/23. This information was routed to the covering provider for review.Patients emergency contact [facility] phone number changed to [phone]. 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  Electronically Signed by Humberto Magnus, RPH, October 04, 2023 [facility] Anticoagulation Clinic  [address] [place], [place] [address]  Phone: [phone]  Fax: [phone]

## 2023-10-07 ENCOUNTER — Inpatient Hospital Stay: Admit: 2023-10-07 | Discharge: 2023-10-07 | Payer: Medicare (Managed Care) | Primary: Internal Medicine

## 2023-10-07 DIAGNOSIS — D6862 Lupus anticoagulant syndrome: Principal | ICD-10-CM

## 2023-10-07 DIAGNOSIS — Z5181 Encounter for therapeutic drug level monitoring: Secondary | ICD-10-CM

## 2023-10-07 DIAGNOSIS — Z7901 Long term (current) use of anticoagulants: Secondary | ICD-10-CM

## 2023-10-07 LAB — PROTIME AND INR
BKR INR: 1.04 (ref 0.86–1.13)
BKR PROTHROMBIN TIME: 11.4 s (ref 9.6–12.3)

## 2023-10-08 ENCOUNTER — Ambulatory Visit: Admit: 2023-10-08 | Payer: PRIVATE HEALTH INSURANCE | Attending: Pharmacotherapy | Primary: Internal Medicine

## 2023-10-08 DIAGNOSIS — K559 Vascular disorder of intestine, unspecified: Secondary | ICD-10-CM

## 2023-10-08 DIAGNOSIS — D6862 Lupus anticoagulant syndrome: Secondary | ICD-10-CM

## 2023-10-08 NOTE — Progress Notes
 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  The patient and family was contacted regarding lab drawn INR results.  Patient's INR findings and prescribed dose from last visit:  09/02/2023   9:27 AM 10/04/2023  10:22 AM 10/08/2023   7:57 AM [facility] INR Date 09/01/2023 10/01/2023 10/07/2023 Associated INR 3.34 1.12 1.04 Instructions 5 mg every day 6/11: 7.5 mg; Otherwise 7.5 mg every Mon, Tue; 5 mg all other days 6/13: 7.5 mg; Otherwise 7.5 mg every Mon, Tue; 5 mg all other days Sunday Dose 5 mg - 5 mg Monday Dose 5 mg 7.5 mg - Tuesday Dose 5 mg 7.5 mg - Wednesday Dose 5 mg 7.5 mg (6/11) - Thursday Dose 5 mg - - Friday Dose 5 mg - 7.5 mg (6/13) Saturday Dose 5 mg - 5 mg Last 7 day dose total 35 mg 35 mg 42.5 mg Next INR Date 09/22/2023 10/07/2023 10/11/2023 The following findings were reported from today's call (no findings if none selected):[]  Missed doses []  Extra doses []  Change in medications [x]  Change in vitamin k rich food intake (green vegetables, herbals, etc) []  Change in alcohol use []  Change in overall health []  Recent hospitalization / emergency department visit []  Upcoming procedure (including dental procedures) [x]  Other Comments:       Patient says she has been eating more salads and vegetables. Patient was encouraged to maintain a consistent diet and she is aware of how vitamin K content effects INR.  The following side effects were reported from today's call (no side effects if none selected):[]  Bleeding []  New bruising []  Warfarin-emergency department visit or hospital admission []  Other Comments:   No findings Assessment/Plan:Patient's INR is 1.04, which is below goal range of 2.0-3.0.  Patient says she took 7.5mg  warfarin on Thursday, 10/07/23 and has been taking warfarin 7.5mg  since Monday, 10/04/23.  She was instructed to take 7.5mg  warfarin tonight (10/08/23), then continue with warfarin 7.5mg  Monday and Tuesday and 5mg  all other days of the week as indicated in the maintenance plan below.  Next INR assessment due Monday, 10/11/23.  Provider messaged with this plan. Anticoagulation Summary  As of 10/08/2023  INR goal:  2.0-3.0 TTR:  48.8% (9.1 y) INR used for dosing:  1.04 (10/07/2023) Warfarin maintenance plan:  7.5 mg (5 mg x 1.5) every Mon, Tue; 5 mg (5 mg x 1) all other days Weekly warfarin total:  40 mg Plan last modified:  Humberto Magnus, [facility] (10/04/2023) Next INR check:  10/11/2023 Priority:  High Priority Target end date:  Indefinite  Indications  Lupus anticoagulant with hypercoagulable state (HC Code) (HC CODE) [D68.62]Ischemic colitis (HC Code) (HC CODE) [K55.9]   Anticoagulation Episode Summary   INR check location:  --  Preferred lab:  --  Send INR reminders to:  [facility] [place] [facility] INR POOL  Comments:  --  Anticoagulation Care Providers   Provider Role Specialty Phone number  Adin Honour, MD Responsible Hematology and Oncology [phone]  Lina Render, NP Responsible Oncology [phone]  All patient's medications have been reviewed and updated as needed.  Electronically Signed by Clarene Crochet, RPH, October 08, 2023 [facility]ticoagulation [facility]  [address] [place], [place] [address]  Phone: [phone]  Fax: [phone]

## 2023-10-11 ENCOUNTER — Encounter: Admit: 2023-10-11 | Payer: PRIVATE HEALTH INSURANCE | Attending: Gastroenterology | Primary: Internal Medicine

## 2023-10-11 ENCOUNTER — Encounter: Admit: 2023-10-11 | Payer: PRIVATE HEALTH INSURANCE | Primary: Internal Medicine

## 2023-10-12 ENCOUNTER — Inpatient Hospital Stay: Admit: 2023-10-12 | Discharge: 2023-10-12 | Payer: Medicare (Managed Care) | Primary: Internal Medicine

## 2023-10-12 DIAGNOSIS — D6862 Lupus anticoagulant syndrome: Secondary | ICD-10-CM

## 2023-10-12 DIAGNOSIS — E1165 Type 2 diabetes mellitus with hyperglycemia: Secondary | ICD-10-CM

## 2023-10-12 DIAGNOSIS — Z794 Long term (current) use of insulin: Secondary | ICD-10-CM

## 2023-10-12 DIAGNOSIS — Z5181 Encounter for therapeutic drug level monitoring: Secondary | ICD-10-CM

## 2023-10-12 DIAGNOSIS — Z7901 Long term (current) use of anticoagulants: Secondary | ICD-10-CM

## 2023-10-12 LAB — ALBUMIN/CREATININE PANEL, URINE, RANDOM
BKR ALBUMIN, URINE, RANDOM: <12 mg/L
BKR CREATININE, URINE, RANDOM: 113 mg/dL

## 2023-10-12 LAB — COMPREHENSIVE METABOLIC PANEL
BKR A/G RATIO: 1.4 (ref 1.0–2.2)
BKR ALANINE AMINOTRANSFERASE (ALT): 15 U/L (ref 10–35)
BKR ALBUMIN: 4 g/dL (ref 3.6–5.1)
BKR ALKALINE PHOSPHATASE: 103 U/L (ref 9–122)
BKR ANION GAP: 10 (ref 7–17)
BKR ASPARTATE AMINOTRANSFERASE (AST): 16 U/L (ref 10–35)
BKR AST/ALT RATIO: 1.1
BKR BILIRUBIN TOTAL: 0.3 mg/dL (ref <=1.2)
BKR BLOOD UREA NITROGEN: 17 mg/dL (ref 8–23)
BKR BUN / CREAT RATIO: 22.1 (ref 8.0–23.0)
BKR CALCIUM: 9.2 mg/dL (ref 8.8–10.2)
BKR CHLORIDE: 103 mmol/L (ref 98–107)
BKR CO2: 27 mmol/L (ref 20–30)
BKR CREATININE DELTA: -0.01
BKR CREATININE: 0.77 mg/dL (ref 0.40–1.30)
BKR EGFR, CREATININE (CKD-EPI 2021): >60 mL/min/{1.73_m2} (ref >=60)
BKR GLOBULIN: 2.8 g/dL (ref 2.0–3.9)
BKR GLUCOSE: 122 mg/dL — ABNORMAL HIGH (ref 70–100)
BKR POTASSIUM: 5 mmol/L (ref 3.3–5.3)
BKR PROTEIN TOTAL: 6.8 g/dL (ref 5.9–8.3)
BKR SODIUM: 140 mmol/L (ref 136–144)

## 2023-10-12 LAB — TSH W/REFLEX TO FT4     (BH GH LMW Q YH): BKR THYROID STIMULATING HORMONE: 0.861 u[IU]/mL

## 2023-10-12 LAB — PROTIME AND INR
BKR INR: 1.5 — ABNORMAL HIGH (ref 0.86–1.13)
BKR PROTHROMBIN TIME: 16.1 s — ABNORMAL HIGH (ref 9.6–12.3)

## 2023-10-12 LAB — LIPID PANEL
BKR CHOLESTEROL/HDL RATIO: 4 (ref 0.0–5.0)
BKR CHOLESTEROL: 158 mg/dL
BKR HDL CHOLESTEROL: 40 mg/dL (ref >=40)
BKR LDL CHOLESTEROL SAMPSON CALCULATED: 100 mg/dL — ABNORMAL HIGH
BKR TRIGLYCERIDES: 97 mg/dL

## 2023-10-13 ENCOUNTER — Ambulatory Visit: Admit: 2023-10-13 | Payer: PRIVATE HEALTH INSURANCE | Primary: Internal Medicine

## 2023-10-13 DIAGNOSIS — K559 Vascular disorder of intestine, unspecified: Secondary | ICD-10-CM

## 2023-10-13 DIAGNOSIS — D6862 Lupus anticoagulant syndrome: Secondary | ICD-10-CM

## 2023-10-13 NOTE — Progress Notes
 Pharmacist Anticoagulation Clinic Telephone Visit Follow Maria [name] (January 23, 1951) is on chronic anticoagulation for the diagnosis of  Lupus anticoagulant with hypercoagulable state (HC Code) (HC CODE)  (primary encounter diagnosis) Ischemic colitis (HC Code) (HC CODE) with an INR goal of 2.0-3.0.  The patient was contacted regarding lab drawn INR results.  Patient's INR findings and prescribed dose from last visit:  10/04/2023  10:22 AM 10/08/2023   7:57 AM 10/13/2023   9:10 AM Anticoagulation Monitoring Assoc[facility] INR Date 10/01/2023 10/07/2023 10/12/2023 Associated INR 1.12 1.04 1.50 Instructions 6/11: 7.5 mg; Otherwise 7.5 mg every Mon, Tue; 5 mg all other days 6/13: 7.5 mg; Otherwise 7.5 mg every Mon, Tue; 5 mg all other days 5 mg every Sun, Tue, Thu; 7.5 mg all other days Sunday Dose - 5 mg 5 mg Monday Dose 7.5 mg - 7.5 mg Tuesday Dose 7.5 mg - - Wednesday Dose 7.5 mg (6/11) - 7.5 mg Thursday Dose - - 5 mg Friday Dose - 7.5 mg (6/13) 7.5 mg Saturday Dose - 5 mg 7.5 mg Last 7 day dose total 35 mg 45 mg 47.5 mg Next INR Date 10/07/2023 10/11/2023 10/19/2023 The following findings were reported from today's call (no findings if none selected):[]  Missed doses []  Extra doses []  Change in medications []  Change in vitamin k rich food intake (green vegetables, herbals, etc) []  Change in alcohol use []  Change in overall health []  Recent hospitalization / emergency department visit []  Upcoming procedure (including dental procedures) []  Other Comments:       No findings The following side effects were reported from today's call (no side effects if none selected):[]  Bleeding []  New bruising []  Warfarin-emergency department visit or hospital admission []  Other Comments:   No side effects Assessment/Plan:Patient's INR is 1.5, which is below goal range of 2.0-3.0.  Patient confirmed she had been taking 7.5 mg warfarin Monday, Tuesday and 5 mg all other days warranting further dose increase.  She was instructed to increase weekly dose by 12% as indicated in the maintenance plan below.  Next INR assessment due 1 week. This information was routed to the covering provider for review.Anticoagulation Summary  As of 10/13/2023  INR goal:  2.0-3.0 TTR:  48.7% (9.1 y) INR used for dosing:  1.50 (10/12/2023) Warfarin maintenance plan:  5 mg (5 mg x 1) every Sun, Tue, Thu; 7.5 mg (5 mg x 1.5) all other days Weekly warfarin total:  45 mg Plan last modified:  Humberto Magnus, [facility] (10/13/2023) Next INR check:  10/19/2023 Priority:  High Priority Target end date:  Indefinite  Indications  Lupus anticoagulant with hypercoagulable state (HC Code) (HC CODE) [D68.62]Ischemic colitis (HC Code) (HC CODE) [K55.9]   Anticoagulation Episode Summary   INR check location:  --  Preferred lab:  --  Send INR reminders to:  [facility] [place] [facility] INR POOL  Comments:  --  Anticoagulation Care Providers   Provider Role Specialty Phone number  Adin Honour, MD Responsible Hematology and Oncology [phone]  Lina Render, NP Responsible Oncology [phone]  All patient's medications have been reviewed and updated as needed.  Electronically Signed by Humberto Magnus, RPH, October 13, 2023 [facility] Antico[facility]  [address] [place], [place] [address]  Phone: [phone]  Fax: [phone]

## 2023-10-14 LAB — HEMOGLOBIN A1C
BKR ESTIMATED AVERAGE GLUCOSE: 192 mg/dL
BKR HEMOGLOBIN A1C: 8.3 % — ABNORMAL HIGH (ref 4.0–5.6)

## 2023-10-15 ENCOUNTER — Encounter: Admit: 2023-10-15 | Payer: PRIVATE HEALTH INSURANCE | Primary: Internal Medicine

## 2023-10-15 DIAGNOSIS — D6862 Lupus anticoagulant syndrome: Secondary | ICD-10-CM

## 2023-10-15 DIAGNOSIS — Z5181 Encounter for therapeutic drug level monitoring: Secondary | ICD-10-CM

## 2023-10-20 ENCOUNTER — Encounter: Admit: 2023-10-20 | Payer: PRIVATE HEALTH INSURANCE | Attending: Oncology | Primary: Internal Medicine

## 2023-10-20 DIAGNOSIS — D6862 Lupus anticoagulant syndrome: Secondary | ICD-10-CM

## 2023-10-21 ENCOUNTER — Inpatient Hospital Stay: Admit: 2023-10-21 | Discharge: 2023-10-21 | Payer: Medicare (Managed Care) | Primary: Internal Medicine

## 2023-10-21 ENCOUNTER — Encounter: Admit: 2023-10-21 | Payer: PRIVATE HEALTH INSURANCE | Attending: Internal Medicine | Primary: Internal Medicine

## 2023-10-21 ENCOUNTER — Ambulatory Visit: Admit: 2023-10-21 | Payer: Medicare (Managed Care) | Attending: Internal Medicine | Primary: Internal Medicine

## 2023-10-21 DIAGNOSIS — Z794 Long term (current) use of insulin: Secondary | ICD-10-CM

## 2023-10-21 DIAGNOSIS — Z5181 Encounter for therapeutic drug level monitoring: Secondary | ICD-10-CM

## 2023-10-21 DIAGNOSIS — E1142 Type 2 diabetes mellitus with diabetic polyneuropathy: Secondary | ICD-10-CM

## 2023-10-21 DIAGNOSIS — I10 Essential (primary) hypertension: Secondary | ICD-10-CM

## 2023-10-21 DIAGNOSIS — D6862 Lupus anticoagulant syndrome: Secondary | ICD-10-CM

## 2023-10-21 DIAGNOSIS — E7800 Pure hypercholesterolemia, unspecified: Secondary | ICD-10-CM

## 2023-10-21 DIAGNOSIS — Z7901 Long term (current) use of anticoagulants: Secondary | ICD-10-CM

## 2023-10-21 LAB — LIPID PANEL
BKR CHOLESTEROL/HDL RATIO: 4.1 (ref 0.0–5.0)
BKR CHOLESTEROL: 135 mg/dL
BKR HDL CHOLESTEROL: 33 mg/dL — ABNORMAL LOW (ref >=40)
BKR LDL CHOLESTEROL SAMPSON CALCULATED: 82 mg/dL
BKR TRIGLYCERIDES: 107 mg/dL

## 2023-10-21 LAB — PROTIME AND INR
BKR INR: 1.87 — ABNORMAL HIGH (ref 0.86–1.13)
BKR PROTHROMBIN TIME: 19.7 s — ABNORMAL HIGH (ref 9.6–12.3)

## 2023-10-22 ENCOUNTER — Encounter: Admit: 2023-10-22 | Payer: PRIVATE HEALTH INSURANCE | Primary: Internal Medicine

## 2023-10-22 ENCOUNTER — Ambulatory Visit: Admit: 2023-10-22 | Payer: PRIVATE HEALTH INSURANCE | Primary: Internal Medicine

## 2023-10-22 DIAGNOSIS — R42 Dizziness and giddiness: Secondary | ICD-10-CM

## 2023-10-22 DIAGNOSIS — D6862 Lupus anticoagulant syndrome: Principal | ICD-10-CM

## 2023-10-22 DIAGNOSIS — K436 Other and unspecified ventral hernia with obstruction, without gangrene: Secondary | ICD-10-CM

## 2023-10-22 DIAGNOSIS — Z5181 Encounter for therapeutic drug level monitoring: Secondary | ICD-10-CM

## 2023-10-22 DIAGNOSIS — I10 Essential (primary) hypertension: Secondary | ICD-10-CM

## 2023-10-22 DIAGNOSIS — K559 Vascular disorder of intestine, unspecified: Secondary | ICD-10-CM

## 2023-10-22 DIAGNOSIS — F32A Depression: Principal | ICD-10-CM

## 2023-10-22 DIAGNOSIS — L719 Rosacea, unspecified: Secondary | ICD-10-CM

## 2023-10-22 DIAGNOSIS — Z86718 Personal history of other venous thrombosis and embolism: Secondary | ICD-10-CM

## 2023-10-22 DIAGNOSIS — E7800 Pure hypercholesterolemia, unspecified: Secondary | ICD-10-CM

## 2023-10-22 DIAGNOSIS — E119 Type 2 diabetes mellitus without complications: Secondary | ICD-10-CM

## 2023-10-22 DIAGNOSIS — K55039 Acute (reversible) ischemia of large intestine, extent unspecified: Secondary | ICD-10-CM

## 2023-10-22 MED ORDER — WARFARIN 5 MG TABLET
5 | ORAL_TABLET | 3 refills | 57.00 days | Status: AC
Start: 2023-10-22 — End: ?

## 2023-10-22 NOTE — Progress Notes
 Pharmacist Anticoagulation Clinic Telephone Visit Follow Maria [name] (05-08-1950) is on chronic anticoagulation for the diagnosis of  Lupus anticoagulant with hypercoagulable state (HC Code) (HC CODE)  (primary encounter diagnosis) Ischemic colitis (HC Code) (HC CODE) with an INR goal of 2.0-3.0.  The patient was contacted regarding lab drawn INR results.  Patient's INR findings and prescribed dose from last visit:  10/08/2023   7:57 AM 10/13/2023   9:10 AM 10/22/2023  10:20 AM Anticoagulation Monitoring Assoc. INR Date 10/07/2023 10/12/2023 10/21/2023 Associated INR 1.04 1.50 1.87 Instructions 6/13: 7.5 mg; Otherwise 7.5 mg every Mon, Tue; 5 mg all other days 5 mg every Sun, Tue, Thu; 7.5 mg all other days 5 mg every Tue, Thu; 7.5 mg all other days Sunday Dose 5 mg 5 mg 7.5 mg Monday Dose - 7.5 mg 7.5 mg Tuesday Dose - - 5 mg Wednesday Dose - 7.5 mg 7.5 mg Thursday Dose - 5 mg 5 mg Friday Dose 7.5 mg (6/13) 7.5 mg 7.5 mg Saturday Dose 5 mg 7.5 mg 7.5 mg Last 7 day dose total 45 mg 47.5 mg 45 mg Next INR Date 10/11/2023 10/19/2023 11/04/2023 The following findings were reported from today's call (no findings if none selected):[]  Missed doses []  Extra doses []  Change in medications []  Change in vitamin k rich food intake (green vegetables, herbals, etc) []  Change in alcohol use []  Change in overall health []  Recent hospitalization / emergency department visit []  Upcoming procedure (including dental procedures) []  Other Comments:       No findings The following side effects were reported from today's call (no side effects if none selected):[]  Bleeding []  New bruising []  Warfarin-emergency department visit or hospital admission []  Other Comments:   No side effects Assessment/Plan:Patient's INR is 1.87, which is below goal range of 2.0-3.0.  Patients previous INR was below goal warranting small dose increase.  She was instructed to increase weekly dose by 6% as indicated in the maintenance plan below.  Next INR assessment due 2 weeks.  Anticoagulation Summary  As of 10/22/2023  INR goal:  2.0-3.0 TTR:  48.6% (9.1 y) INR used for dosing:  1.87 (10/21/2023) Warfarin maintenance plan:  5 mg (5 mg x 1) every Tue, Thu; 7.5 mg (5 mg x 1.5) all other days Weekly warfarin total:  47.5 mg Plan last modified:  Lucienne Coy, [facility] (10/22/2023) Next INR check:  11/04/2023 Priority:  High Priority Target end date:  Indefinite  Indications  Lupus anticoagulant with hypercoagulable state (HC Code) (HC CODE) [D68.62]Ischemic colitis (HC Code) (HC CODE) [K55.9]   Anticoagulation Episode Summary   INR check location:  --  Preferred lab:  --  Send INR reminders to:  [facility] [place] [facility] INR POOL  Comments:  --  Anticoagulation Care Providers   Provider Role Specialty Phone number  Gladis Larraine Hummer, MD Responsible Hematology and Oncology [phone]  Joesph Landry Maxwell, NP Responsible Oncology [phone]  All patient's medications have been reviewed and updated as needed.  Electronically Signed by Coy Lucienne, RPH, October 22, 2023 [facility] Anticoagulation [facility]  [address] [place], [place] [address]  Phone: [phone]  Fax: [phone]

## 2023-10-29 ENCOUNTER — Encounter: Admit: 2023-10-29 | Payer: PRIVATE HEALTH INSURANCE | Primary: Internal Medicine

## 2023-10-29 DIAGNOSIS — Z86718 Personal history of other venous thrombosis and embolism: Secondary | ICD-10-CM

## 2023-10-29 DIAGNOSIS — D6862 Lupus anticoagulant syndrome: Principal | ICD-10-CM

## 2023-10-29 DIAGNOSIS — Z5181 Encounter for therapeutic drug level monitoring: Secondary | ICD-10-CM

## 2023-11-02 ENCOUNTER — Encounter: Admit: 2023-11-02 | Payer: PRIVATE HEALTH INSURANCE | Attending: Ophthalmology | Primary: Internal Medicine

## 2023-11-05 ENCOUNTER — Encounter: Admit: 2023-11-05 | Payer: PRIVATE HEALTH INSURANCE | Primary: Internal Medicine

## 2023-11-05 DIAGNOSIS — D6862 Lupus anticoagulant syndrome: Principal | ICD-10-CM

## 2023-11-05 DIAGNOSIS — Z86718 Personal history of other venous thrombosis and embolism: Secondary | ICD-10-CM

## 2023-11-05 DIAGNOSIS — Z5181 Encounter for therapeutic drug level monitoring: Secondary | ICD-10-CM

## 2023-11-12 ENCOUNTER — Encounter: Admit: 2023-11-12 | Payer: PRIVATE HEALTH INSURANCE | Primary: Internal Medicine

## 2023-11-12 DIAGNOSIS — Z5181 Encounter for therapeutic drug level monitoring: Secondary | ICD-10-CM

## 2023-11-12 DIAGNOSIS — Z86718 Personal history of other venous thrombosis and embolism: Secondary | ICD-10-CM

## 2023-11-12 DIAGNOSIS — D6862 Lupus anticoagulant syndrome: Principal | ICD-10-CM

## 2023-11-15 ENCOUNTER — Telehealth: Admit: 2023-11-15 | Payer: PRIVATE HEALTH INSURANCE | Primary: Internal Medicine

## 2023-11-15 NOTE — Telephone Encounter
 Spoke with Maria [name] to remind her that her INR was due on 7/10. First reminder and patient states she will try to get to lab today.

## 2023-11-17 ENCOUNTER — Encounter: Admit: 2023-11-17 | Payer: PRIVATE HEALTH INSURANCE | Primary: Internal Medicine

## 2023-11-17 ENCOUNTER — Inpatient Hospital Stay: Admit: 2023-11-17 | Discharge: 2023-11-17 | Payer: Medicare (Managed Care) | Primary: Internal Medicine

## 2023-11-17 ENCOUNTER — Ambulatory Visit: Admit: 2023-11-17 | Payer: PRIVATE HEALTH INSURANCE | Primary: Internal Medicine

## 2023-11-17 DIAGNOSIS — Z5181 Encounter for therapeutic drug level monitoring: Secondary | ICD-10-CM

## 2023-11-17 DIAGNOSIS — D6862 Lupus anticoagulant syndrome: Principal | ICD-10-CM

## 2023-11-17 DIAGNOSIS — E7800 Pure hypercholesterolemia, unspecified: Secondary | ICD-10-CM

## 2023-11-17 DIAGNOSIS — Z7901 Long term (current) use of anticoagulants: Secondary | ICD-10-CM

## 2023-11-17 DIAGNOSIS — R42 Dizziness and giddiness: Secondary | ICD-10-CM

## 2023-11-17 DIAGNOSIS — K559 Vascular disorder of intestine, unspecified: Secondary | ICD-10-CM

## 2023-11-17 DIAGNOSIS — F32A Depression: Principal | ICD-10-CM

## 2023-11-17 DIAGNOSIS — L719 Rosacea, unspecified: Secondary | ICD-10-CM

## 2023-11-17 DIAGNOSIS — K55039 Acute (reversible) ischemia of large intestine, extent unspecified: Secondary | ICD-10-CM

## 2023-11-17 DIAGNOSIS — K436 Other and unspecified ventral hernia with obstruction, without gangrene: Secondary | ICD-10-CM

## 2023-11-17 DIAGNOSIS — I10 Essential (primary) hypertension: Secondary | ICD-10-CM

## 2023-11-17 DIAGNOSIS — E119 Type 2 diabetes mellitus without complications: Secondary | ICD-10-CM

## 2023-11-17 DIAGNOSIS — Z86718 Personal history of other venous thrombosis and embolism: Secondary | ICD-10-CM

## 2023-11-17 LAB — PROTIME AND INR
BKR INR: 2.2 — ABNORMAL HIGH (ref 0.86–1.13)
BKR PROTHROMBIN TIME: 23 s — ABNORMAL HIGH (ref 9.6–12.3)

## 2023-11-17 NOTE — Progress Notes
 Pharmacist Anticoagulation Clinic Telephone Visit Follow Maria [name] (February 01, 1951) is on chronic anticoagulation for the diagnosis of  Lupus anticoagulant with hypercoagulable state (HC Code) (HC CODE)  (primary encounter diagnosis) Ischemic colitis (HC Code) (HC CODE) with an INR goal of 2.0-3.0.  The patient was contacted regarding lab drawn INR results.  Patient's INR findings and prescribed dose from last visit:  10/13/2023   9:10 AM 10/22/2023  10:20 AM 11/17/2023   4:19 PM Anticoagulation Monitoring Assoc. INR Date 10/12/2023 10/21/2023 11/17/2023 Associated INR 1.50 1.87 2.20 Instructions 5 mg every Sun, Tue, Thu; 7.5 mg all other days 5 mg every Tue, Thu; 7.5 mg all other days 5 mg every Tue, Thu; 7.5 mg all other days Sunday Dose 5 mg 7.5 mg 7.5 mg Monday Dose 7.5 mg 7.5 mg 7.5 mg Tuesday Dose - 5 mg 5 mg Wednesday Dose 7.5 mg 7.5 mg 7.5 mg Thursday Dose 5 mg 5 mg 5 mg Friday Dose 7.5 mg 7.5 mg 7.5 mg Saturday Dose 7.5 mg 7.5 mg 7.5 mg Last 7 day dose total 47.5 mg 45 mg 47.5 mg Next INR Date 10/19/2023 11/04/2023 12/08/2023 The following findings were reported from today's call (no findings if none selected):[]  Missed doses []  Extra doses []  Change in medications []  Change in vitamin k rich food intake (green vegetables, herbals, etc) []  Change in alcohol use []  Change in overall health []  Recent hospitalization / emergency department visit []  Upcoming procedure (including dental procedures) []  Other Comments:       No findings The following side effects were reported from today's call (no side effects if none selected):[]  Bleeding []  New bruising []  Warfarin-emergency department visit or hospital admission []  Other Comments:   No side effects Assessment/Plan:Patient's INR is 2.2, which is within goal range of 2.0-3.0.  She was instructed to continue current weekly dose as indicated in the maintenance plan below.  Next INR assessment due 12/08/23.  Anticoagulation Summary  As of 11/17/2023  INR goal:  2.0-3.0 TTR:  48.7% (9.2 y) INR used for dosing:  2.20 (11/17/2023) Warfarin maintenance plan:  5 mg (5 mg x 1) every Tue, Thu; 7.5 mg (5 mg x 1.5) all other days Weekly warfarin total:  47.5 mg Plan last modified:  Lucienne Coy, [facility] (10/22/2023) Next INR check:  12/08/2023 Priority:  High Priority Target end date:  Indefinite  Indications  Lupus anticoagulant with hypercoagulable state (HC Code) (HC CODE) [D68.62]Ischemic colitis (HC Code) (HC CODE) [K55.9]   Anticoagulation Episode Summary   INR check location:  --  Preferred lab:  --  Send INR reminders to:  [facility] [place] [facility] INR POOL  Comments:  --  Anticoagulation Care Providers   Provider Role Specialty Phone number  Gladis Larraine Hummer, MD Responsible Hematology and Oncology [phone]  Joesph Landry Maxwell, NP Responsible Oncology [phone]  All patient's medications have been reviewed and updated as needed.  Electronically Signed by Coy Lucienne, RPH, November 17, 2023 [facility] Anticoagulation Clinic  [address] [place], [place] [address]  Phone: [phone]  Fax: [phone]

## 2023-11-24 ENCOUNTER — Encounter
Admit: 2023-11-24 | Payer: PRIVATE HEALTH INSURANCE | Attending: Student in an Organized Health Care Education/Training Program | Primary: Internal Medicine

## 2023-11-26 ENCOUNTER — Encounter: Admit: 2023-11-26 | Payer: PRIVATE HEALTH INSURANCE | Primary: Internal Medicine

## 2023-11-26 DIAGNOSIS — Z86718 Personal history of other venous thrombosis and embolism: Secondary | ICD-10-CM

## 2023-11-26 DIAGNOSIS — D6862 Lupus anticoagulant syndrome: Principal | ICD-10-CM

## 2023-11-26 DIAGNOSIS — Z5181 Encounter for therapeutic drug level monitoring: Secondary | ICD-10-CM

## 2023-12-02 ENCOUNTER — Ambulatory Visit: Admit: 2023-12-02 | Payer: PRIVATE HEALTH INSURANCE | Attending: Emergency Medicine | Primary: Internal Medicine

## 2023-12-02 VITALS — HR 97 | Temp 97.00000°F | Ht <= 58 in

## 2023-12-02 DIAGNOSIS — L299 Pruritus, unspecified: Secondary | ICD-10-CM

## 2023-12-02 DIAGNOSIS — R448 Other symptoms and signs involving general sensations and perceptions: Secondary | ICD-10-CM

## 2023-12-02 DIAGNOSIS — H9311 Tinnitus, right ear: Principal | ICD-10-CM

## 2023-12-02 NOTE — Progress Notes
 It is my pleasure to see your patient Maria [name] whose office note is provided below.HPI: Maria [name] is referred by Dr Spero for consultation regarding evaluation of facial pressure and tinnitus.History of Present IllnessSofia [name] is a 73 year old female who presents for follow-up of ongoing sinus and ear symptoms.She previously saw Dr. 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 The stones were successfully removed during and she has not had any recurrent episodes since the treatment. She has a history of sinus issues, including a significant fungal infection approximately ten years ago, which required surgery on her right maxillary sinus. She continues to experience symptoms such as a feeling of congestion and pressure in the forehead. No changes in hearing, sense of smell, or taste, and no runny nose unless sneezing. No difficulty breathing through her nose. She has been using a nasal rinse device to manage her sinus symptoms, which she last used the night before the visit.Her right ear is particularly bothersome, with symptoms of itching, buzzing, and ringing, especially at night. She describes the right ear as feeling narrow compared to the left, which she perceives as more open. She uses Q-tips to alleviate the itchiness, which she describes as severe, and notes that the right ear is often moist. The patient?s complete system review, past medical, family, and social history have been reviewed and are as noted in the electronic record and below.Past Medical History: has a past medical history of Acute ischemic colitis (HC Code) (HC CODE) (2015), Depression, Diabetes mellitus (HC Code), Diabetes mellitus, type II (HC Code), Hypercholesteremia, Hypertension, Lupus anticoagulant disorder (HC Code) (HC CODE), Rosacea, Ventral hernia with bowel obstruction, and Vertigo.Past Surgical History: has a past surgical history that includes Knee ligament reconstruction (Right, 2004); Laparoscopic Nissen fundoplication; Umbilical hernia repair; Ventral hernia repair; Tubal ligation; THYROID BIOPSY; 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 She has never used smokeless tobacco. She reports that she does not currently use alcohol after a past usage of about 1.0 standard drink of alcohol per week. She reports that she does not use drugs.Family History:Family History Problem Relation Age of Onset  Bladder cancer Mother   Colon cancer Father   Diabetes Father   Diabetes Sister   Colon cancer Paternal Aunt   Colon cancer Maternal Cousin       died of colona cancer at age 81  Breast cancer Maternal Cousin       2 cousins with breast cancer  Diabetes Paternal Uncle   Medication:Encounter Medications[1] Allergies:Allergies[2] Physical ExamConstitutional:  The patient was well-developed and well-nourished and in no apparent distress.  The patient communicated by phonation with a clear voice.  Head and Face:  There were no cutaneous lesions of the head or face. No TMJ tendernessEyes:  Extraocular movements were intact with no nystagmus.   Ears:  The auricles were normal bilaterally.  The external auditory canals were clear and the tympanic membranes were flat without any evidence of retraction or effusion. Nose:  The external nose appears normal.  Anterior rhinoscopy demonstrated a midline nasal septum with normal turbinates.  Oral Cavity/Oropharynx:  The oral cavity showed no mucosal abnormalities.  The teeth and gums were unremarkable.  The floor of mouth and oral tongue were soft.  The tongue was fully mobile and protruded midline.  There were no mucosal abnormalities of the oropharynx.  Tonsils 1+.Neurologic:  Patient is alert  and oriented x 3. Neck/Lymphatic:  There were no palpable lymph nodes or masses in the lateral neck or central compartment.  There were no thyroid masses palpated.  There were no palpable parotid or submandibular salivary gland masses. Respiratory: The patient is breathing comfortably.  There is no stridor or increased work of breathing.Procedure:Rigid Nasal Endoscopy, Diagnostic (CPT Code 68768) 08/07/25The risks, benefits, and indications of the procedure was explained to the pt and the pt provided verbal consent. Topical nasal spray: Lidocaine  with epinephrine After applying topical anesthetic into patient's nasal passages, the endoscope was passed into bilateral nasal passages to perform an intranasal exam and to visualize the nasopharynxNasal cavities were examined bilaterally. On the Right: The nasal cavity was clear. The floor is clear, the eustachian tube is patent, the nasopharynx is clear, the sphenoethmoid recess is clear, the middle meatus is clear. Maxillary antrostomy present with some scar tissue to the middle turbinate. The turbinates appear healthy. The superior meatus is clear. On the Left: The nasal cavity was clear. The floor is clear, the eustachian tube is patent, the nasopharynx is clear, the sphenoethmoid recess is clear, the middle meatus is clear. The turbinates appear healthy. The superior meatus is clear.		Diagnosis:1. Tinnitus, right  Ambulatory referral to Audiology  2. Facial pressure  [place] Sinus wo IV Contrast  3. Ear itch      Assessment/Plan:Maria [name] is a 73 y.o. female who presents today for evaluation of ear itching, tinnitus, and facial pressure. Patient with right sided tinnitus, worse at night, without subjective hearing loss. Exam today with clear TM without effusion or retraction. Patient also with a history of fungal sinusitis, now with persistent facial pressure and intermittent post nasal drip. Nasal endoscopy without evidence of masses or polyps, no drainage or pus noted in nasal cavity, nasopharynx, or sphenoethmoid recess.  Obtain audiogram to assess for hearing changes given history of tinnitus.Obtain [place] sinus to assess for sinus disease given facial pressure and subjective post nasal drip.Begin nasal saline irrigations with neil med sinus rinse to help thin mucus.Use mineral oil 2-3 times weekly to help moisturize skin of EAC.Follow up in 3 months with audio.The patient seemed to be in agreement and will contact me for persistent or progressive symptoms or return in follow-up.A copy of my note has been sent to the referring provider.An electronic communication to the primary provider was sent and confirmed. Electronically Signed by Alan CHRISTELLA Drone, PA [1] Outpatient Encounter Medications as of 12/02/2023 Medication Sig Dispense Refill  acetaminophen  (TYLENOL ) 500 mg tablet Take 1 tablet (500 mg total) by mouth every 4 (four) hours as needed. 30 tablet 11  BD NANO 2ND GEN PEN NEEDLE 32 gauge x 5/32 USE WITH INSULIN  PEN UP TO 4 TIMES PER DAY 100 each 0  biotin 1 mg tablet Take 1 tablet (1,000 mcg total) by mouth daily.    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 Contour 1 each 0  blood sugar diagnostic (CONTOUR TEST STRIPS) test strips Use to check blood sugar 3 times per day (Type 2 Diabetes: E11.9 on insulin ). Accu-chek 300 each 11  blood-glucose sensor (FREESTYLE LIBRE 3 PLUS SENSOR) device Apply a new sensor every 15 days E11.9 on insulin  6 each 3  cholecalciferol, vitamin D3, 125 mcg (5,000 unit) tablet Take 1 tablet (5,000 Units total) by mouth every other day.    diphenhydramine  HCl (BENADRYL  ALLERGY  ORAL) Take 1 tablet by mouth as needed.    emollient (  BIAFINE) cream Apply topically 2 (two) times daily. (Patient not taking: Reported on 06/30/2023) 454 g 0 FREESTYLE LIBRE 3 READER Misc Use as directed. 1 each 0  gabapentin  (NEURONTIN ) 300 mg capsule Take 1 capsule (300 mg total) by mouth nightly.    insulin  glargine (LANTUS ) 100 unit/mL vial Inject 10 Units under the skin nightly.    insulin  lispro (HUMALOG  KWIKPEN INSULIN ) 100 unit/mL pen Inject 2 units with smaller carb meals and 4 units with larger carb meals 15 mL 2  JARDIANCE  25 mg tablet Take 1 tablet (25 mg total) by mouth daily.    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 Accu-chek 300 each 11  losartan  (COZAAR ) 25 mg tablet Take 1 tablet (25 mg total) by mouth daily.    meclizine (ANTIVERT) 12.5 mg tablet Take 1 tablet (12.5 mg total) by mouth daily as needed for Dizziness 30 tablet 1  melatonin 10 mg Tab Take 10 mg by mouth nightly as needed.     multivitamin tablet Take 1 tablet by mouth daily.    olopatadine  (PATADAY ) 0.2 % ophthalmic solution Place 1 drop into both eyes daily. 2.5 mL 6  ondansetron  (ZOFRAN ) 4 MG tablet Take 1 tablet (4 mg total) by mouth every 8 (eight) hours as needed for nausea.    phytonadione, vit K1, (VITAMIN K) 100 mcg tablet Take 1 tablet (100 mcg total) by mouth daily.    pravastatin  (PRAVACHOL ) 40 MG tablet Take 1.5 tablets (60 mg total) by mouth nightly.    QUEtiapine  (SEROQUEL ) 50 mg Immediate Release tablet Take 1 tablet (50 mg total) by mouth nightly.    senna (SENOKOT) 8.6 mg tablet Take 2 tablets by mouth nightly.. 30 tablet 11  SITagliptin phosphate (JANUVIA) 100 mg tablet Take 1 tablet (100 mg total) by mouth daily. 90 tablet 3  venlafaxine  (EFFEXOR  XR) 150 mg XR 24 hr extended release capsule Take 1 capsule (150 mg total) by mouth daily.    warfarin (COUMADIN ) 5 mg tablet Take 5 mg on Tuesday, Thursday and 7.5 mg all other days of the week or as directed by the warfarin clinic. 100 tablet 2 No facility-administered encounter medications on file as of 12/02/2023. [2] AllergiesAllergen Reactions  Morphine  Hives and Itching   Pt tolerated morphine  during admission 08/08/16-08/09/16.  Pt premedicated with diphenhydramine   Biaxin  [Clarithromycin]   Latex, Natural Rubber Rash  Mitosol [Mitomycin] Rash  Oxycodone  Rash

## 2023-12-03 ENCOUNTER — Encounter: Admit: 2023-12-03 | Payer: PRIVATE HEALTH INSURANCE | Primary: Internal Medicine

## 2023-12-03 DIAGNOSIS — Z5181 Encounter for therapeutic drug level monitoring: Secondary | ICD-10-CM

## 2023-12-03 DIAGNOSIS — Z86718 Personal history of other venous thrombosis and embolism: Secondary | ICD-10-CM

## 2023-12-03 DIAGNOSIS — D6862 Lupus anticoagulant syndrome: Principal | ICD-10-CM

## 2023-12-10 ENCOUNTER — Encounter: Admit: 2023-12-10 | Payer: PRIVATE HEALTH INSURANCE | Primary: Internal Medicine

## 2023-12-10 DIAGNOSIS — D6862 Lupus anticoagulant syndrome: Principal | ICD-10-CM

## 2023-12-10 DIAGNOSIS — Z86718 Personal history of other venous thrombosis and embolism: Secondary | ICD-10-CM

## 2023-12-10 DIAGNOSIS — Z5181 Encounter for therapeutic drug level monitoring: Secondary | ICD-10-CM

## 2023-12-13 ENCOUNTER — Telehealth: Admit: 2023-12-13 | Payer: PRIVATE HEALTH INSURANCE | Primary: Internal Medicine

## 2023-12-13 NOTE — Telephone Encounter
 This afternoon, I contacted Maria [name] to remind her that she is due for her INR.  I spoke directly to the patient and she stated that she forgot and would go in the next couple days.Tully GORMAN Running, CPHT8/18/20251:07 PMSmilow [facility]

## 2023-12-17 ENCOUNTER — Encounter: Admit: 2023-12-17 | Payer: PRIVATE HEALTH INSURANCE | Primary: Internal Medicine

## 2023-12-17 DIAGNOSIS — Z86718 Personal history of other venous thrombosis and embolism: Secondary | ICD-10-CM

## 2023-12-17 DIAGNOSIS — D6862 Lupus anticoagulant syndrome: Principal | ICD-10-CM

## 2023-12-17 DIAGNOSIS — Z5181 Encounter for therapeutic drug level monitoring: Secondary | ICD-10-CM

## 2023-12-20 ENCOUNTER — Inpatient Hospital Stay: Admit: 2023-12-20 | Discharge: 2023-12-20 | Payer: Medicare (Managed Care) | Primary: Internal Medicine

## 2023-12-20 DIAGNOSIS — D6862 Lupus anticoagulant syndrome: Principal | ICD-10-CM

## 2023-12-20 DIAGNOSIS — Z794 Long term (current) use of insulin: Secondary | ICD-10-CM

## 2023-12-20 DIAGNOSIS — E1165 Type 2 diabetes mellitus with hyperglycemia: Secondary | ICD-10-CM

## 2023-12-20 DIAGNOSIS — Z7901 Long term (current) use of anticoagulants: Secondary | ICD-10-CM

## 2023-12-20 DIAGNOSIS — Z5181 Encounter for therapeutic drug level monitoring: Secondary | ICD-10-CM

## 2023-12-20 LAB — COMPREHENSIVE METABOLIC PANEL
BKR A/G RATIO: 1.3 (ref 1.0–2.2)
BKR ALANINE AMINOTRANSFERASE (ALT): 20 U/L (ref 10–35)
BKR ALBUMIN: 3.9 g/dL (ref 3.6–5.1)
BKR ALKALINE PHOSPHATASE: 132 U/L — ABNORMAL HIGH (ref 9–122)
BKR ANION GAP: 8 (ref 7–17)
BKR ASPARTATE AMINOTRANSFERASE (AST): 25 U/L (ref 10–35)
BKR AST/ALT RATIO: 1.3
BKR BILIRUBIN TOTAL: 0.3 mg/dL (ref <=1.2)
BKR BLOOD UREA NITROGEN: 19 mg/dL (ref 8–23)
BKR BUN / CREAT RATIO: 21.6 (ref 8.0–23.0)
BKR CALCIUM: 9.4 mg/dL (ref 8.8–10.2)
BKR CHLORIDE: 101 mmol/L (ref 98–107)
BKR CO2: 29 mmol/L (ref 20–30)
BKR CREATININE DELTA: 0.11
BKR CREATININE: 0.88 mg/dL (ref 0.40–1.30)
BKR EGFR, CREATININE (CKD-EPI 2021): >60 mL/min/1.73m2 (ref >=60)
BKR GLOBULIN: 3.1 g/dL (ref 2.0–3.9)
BKR GLUCOSE: 119 mg/dL — ABNORMAL HIGH (ref 70–100)
BKR POTASSIUM: 4.8 mmol/L (ref 3.3–5.3)
BKR PROTEIN TOTAL: 7 g/dL (ref 5.9–8.3)
BKR SODIUM: 138 mmol/L (ref 136–144)

## 2023-12-20 LAB — PROTIME AND INR
BKR INR: 3.06 — ABNORMAL HIGH (ref 0.86–1.13)
BKR PROTHROMBIN TIME: 31.3 s — ABNORMAL HIGH (ref 9.6–12.3)

## 2023-12-21 ENCOUNTER — Encounter: Admit: 2023-12-21 | Payer: PRIVATE HEALTH INSURANCE | Primary: Internal Medicine

## 2023-12-21 ENCOUNTER — Ambulatory Visit: Admit: 2023-12-21 | Payer: PRIVATE HEALTH INSURANCE | Primary: Internal Medicine

## 2023-12-21 DIAGNOSIS — L719 Rosacea, unspecified: Secondary | ICD-10-CM

## 2023-12-21 DIAGNOSIS — E119 Type 2 diabetes mellitus without complications: Secondary | ICD-10-CM

## 2023-12-21 DIAGNOSIS — D6862 Lupus anticoagulant syndrome: Secondary | ICD-10-CM

## 2023-12-21 DIAGNOSIS — R42 Dizziness and giddiness: Secondary | ICD-10-CM

## 2023-12-21 DIAGNOSIS — K436 Other and unspecified ventral hernia with obstruction, without gangrene: Secondary | ICD-10-CM

## 2023-12-21 DIAGNOSIS — F32A Depression: Principal | ICD-10-CM

## 2023-12-21 DIAGNOSIS — I10 Essential (primary) hypertension: Secondary | ICD-10-CM

## 2023-12-21 DIAGNOSIS — K559 Vascular disorder of intestine, unspecified: Secondary | ICD-10-CM

## 2023-12-21 DIAGNOSIS — E7800 Pure hypercholesterolemia, unspecified: Secondary | ICD-10-CM

## 2023-12-21 DIAGNOSIS — K55039 Acute (reversible) ischemia of large intestine, extent unspecified: Secondary | ICD-10-CM

## 2023-12-21 LAB — HEMOGLOBIN A1C
BKR ESTIMATED AVERAGE GLUCOSE: 180 mg/dL
BKR HEMOGLOBIN A1C: 7.9 % — ABNORMAL HIGH (ref 4.0–5.6)

## 2023-12-21 NOTE — Progress Notes
 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  The patient was contacted regarding lab drawn INR results.  Patient's INR findings and prescribed dose from last visit:  10/22/2023  10:20 AM 11/17/2023   4:19 PM 12/21/2023   8:51 AM [facility] INR Date 10/21/2023 11/17/2023 12/20/2023 Associated INR 1.87 2.20 3.06 Instructions 5 mg every Tue, Thu; 7.5 mg all other days 5 mg every Tue, Thu; 7.5 mg all other days 5 mg every Tue, Thu; 7.5 mg all other days Sunday Dose 7.5 mg 7.5 mg 7.5 mg Monday Dose 7.5 mg 7.5 mg 7.5 mg Tuesday Dose 5 mg 5 mg 5 mg Wednesday Dose 7.5 mg 7.5 mg 7.5 mg Thursday Dose 5 mg 5 mg 5 mg Friday Dose 7.5 mg 7.5 mg 7.5 mg Saturday Dose 7.5 mg 7.5 mg 7.5 mg Last 7 day dose total 45 mg 47.5 mg 47.5 mg Next INR Date 11/04/2023 12/08/2023 01/06/2024 The following findings were reported from today's call (no findings if none selected):[]  Missed doses []  Extra doses [x]  Change in medications []  Change in vitamin k rich food intake (green vegetables, herbals, etc) []  Change in alcohol use [x]  Change in overall health []  Recent hospitalization / emergency department visit []  Upcoming procedure (including dental procedures) []  Other Comments:       Patient is taking Tylenol  for headaches The following side effects were reported from today's call (no side effects if none selected):[]  Bleeding []  New bruising []  Warfarin-emergency department visit or hospital admission []  Other Comments:   No side effects Assessment/Plan:Patient's INR is 3.09, which is above goal range of 2.0-3.0.  Patient is taking Tylenol  for headaches and is scheduled for a cat scan because of tinnitus and facial pressure. Patient has a history of fungal sinus infections.  She was instructed to continue current weekly dose as indicated in the maintenance plan below.  Next INR assessment due 01/06/24.  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  Electronically Signed by Coy Lucienne, RPH, December 21, 2023 [facility] Anticoagulation Clinic  [address] [place], [place] [address]  Phone: [phone]  Fax: [phone]

## 2023-12-24 ENCOUNTER — Encounter: Admit: 2023-12-24 | Payer: PRIVATE HEALTH INSURANCE | Primary: Internal Medicine

## 2023-12-24 DIAGNOSIS — Z5181 Encounter for therapeutic drug level monitoring: Secondary | ICD-10-CM

## 2023-12-24 DIAGNOSIS — D6862 Lupus anticoagulant syndrome: Principal | ICD-10-CM

## 2023-12-24 DIAGNOSIS — Z86718 Personal history of other venous thrombosis and embolism: Secondary | ICD-10-CM

## 2023-12-29 ENCOUNTER — Encounter: Admit: 2023-12-29 | Payer: PRIVATE HEALTH INSURANCE | Primary: Internal Medicine

## 2023-12-29 DIAGNOSIS — E7800 Pure hypercholesterolemia, unspecified: Secondary | ICD-10-CM

## 2023-12-29 DIAGNOSIS — E119 Type 2 diabetes mellitus without complications: Secondary | ICD-10-CM

## 2023-12-29 DIAGNOSIS — D6862 Lupus anticoagulant syndrome: Secondary | ICD-10-CM

## 2023-12-29 DIAGNOSIS — K436 Other and unspecified ventral hernia with obstruction, without gangrene: Secondary | ICD-10-CM

## 2023-12-29 DIAGNOSIS — K55039 Acute (reversible) ischemia of large intestine, extent unspecified: Secondary | ICD-10-CM

## 2023-12-29 DIAGNOSIS — F32A Depression: Principal | ICD-10-CM

## 2023-12-29 DIAGNOSIS — R42 Dizziness and giddiness: Secondary | ICD-10-CM

## 2023-12-29 DIAGNOSIS — I10 Essential (primary) hypertension: Secondary | ICD-10-CM

## 2023-12-29 DIAGNOSIS — L719 Rosacea, unspecified: Secondary | ICD-10-CM

## 2023-12-31 ENCOUNTER — Encounter: Admit: 2023-12-31 | Payer: PRIVATE HEALTH INSURANCE | Primary: Internal Medicine

## 2023-12-31 DIAGNOSIS — D6862 Lupus anticoagulant syndrome: Principal | ICD-10-CM

## 2023-12-31 DIAGNOSIS — Z5181 Encounter for therapeutic drug level monitoring: Secondary | ICD-10-CM

## 2023-12-31 DIAGNOSIS — Z86718 Personal history of other venous thrombosis and embolism: Secondary | ICD-10-CM

## 2024-01-04 ENCOUNTER — Encounter: Admit: 2024-01-04 | Payer: PRIVATE HEALTH INSURANCE | Attending: Hematology & Oncology | Primary: Internal Medicine

## 2024-01-04 DIAGNOSIS — D6862 Lupus anticoagulant syndrome: Principal | ICD-10-CM

## 2024-01-04 DIAGNOSIS — Z7901 Long term (current) use of anticoagulants: Secondary | ICD-10-CM

## 2024-01-06 ENCOUNTER — Ambulatory Visit: Admit: 2024-01-06 | Payer: PRIVATE HEALTH INSURANCE | Primary: Internal Medicine

## 2024-01-06 ENCOUNTER — Ambulatory Visit: Admit: 2024-01-06 | Payer: Medicare (Managed Care) | Attending: Hematology & Oncology | Primary: Internal Medicine

## 2024-01-06 ENCOUNTER — Telehealth: Admit: 2024-01-06 | Payer: PRIVATE HEALTH INSURANCE | Attending: Hematology & Oncology | Primary: Internal Medicine

## 2024-01-06 ENCOUNTER — Ambulatory Visit: Admit: 2024-01-06 | Payer: PRIVATE HEALTH INSURANCE | Attending: Hematology & Oncology | Primary: Internal Medicine

## 2024-01-06 NOTE — Telephone Encounter
 Lvm advising pt that today's apt has been change to a phone visit.

## 2024-01-06 NOTE — Telephone Encounter
 Hi,Patient is unable to make today's appointment, as she is not feeling well. She has not gone for labs, but due to Dr. Hillard availability, does she still want to see her for a telehealth? Please advise - informed patient we will call her back.

## 2024-01-06 NOTE — Telephone Encounter
 Lvm that visit has been changed to a telehealth.

## 2024-01-07 ENCOUNTER — Encounter: Admit: 2024-01-07 | Payer: PRIVATE HEALTH INSURANCE | Primary: Internal Medicine

## 2024-01-07 DIAGNOSIS — Z5181 Encounter for therapeutic drug level monitoring: Secondary | ICD-10-CM

## 2024-01-07 DIAGNOSIS — Z86718 Personal history of other venous thrombosis and embolism: Secondary | ICD-10-CM

## 2024-01-07 DIAGNOSIS — D6862 Lupus anticoagulant syndrome: Principal | ICD-10-CM

## 2024-01-11 ENCOUNTER — Telehealth: Admit: 2024-01-11 | Payer: PRIVATE HEALTH INSURANCE | Primary: Internal Medicine

## 2024-01-11 NOTE — Telephone Encounter
 This morning I attempted to contact patient, Maria [name] to remind her that she is due for an INR.  The patient had a lab appt on 9/11, but cancelled due to illness.  I left a voice message asking the patient to reschedule the labwork at her earliest convenience.  And that I hope she is feeling better.  Her last documented INR was on 8/25.  This is the first phone call reminder.Tully GORMAN Running, CPHT9/16/202510:06 AMSmilow [facility]

## 2024-01-12 ENCOUNTER — Inpatient Hospital Stay: Admit: 2024-01-12 | Discharge: 2024-01-12 | Payer: Medicare (Managed Care) | Primary: Internal Medicine

## 2024-01-12 ENCOUNTER — Ambulatory Visit: Admit: 2024-01-12 | Payer: PRIVATE HEALTH INSURANCE | Primary: Internal Medicine

## 2024-01-12 ENCOUNTER — Encounter: Admit: 2024-01-12 | Payer: PRIVATE HEALTH INSURANCE | Primary: Internal Medicine

## 2024-01-12 DIAGNOSIS — E7800 Pure hypercholesterolemia, unspecified: Secondary | ICD-10-CM

## 2024-01-12 DIAGNOSIS — K55039 Acute (reversible) ischemia of large intestine, extent unspecified: Secondary | ICD-10-CM

## 2024-01-12 DIAGNOSIS — D6862 Lupus anticoagulant syndrome: Principal | ICD-10-CM

## 2024-01-12 DIAGNOSIS — R42 Dizziness and giddiness: Secondary | ICD-10-CM

## 2024-01-12 DIAGNOSIS — L719 Rosacea, unspecified: Secondary | ICD-10-CM

## 2024-01-12 DIAGNOSIS — F32A Depression: Principal | ICD-10-CM

## 2024-01-12 DIAGNOSIS — Z5181 Encounter for therapeutic drug level monitoring: Secondary | ICD-10-CM

## 2024-01-12 DIAGNOSIS — E119 Type 2 diabetes mellitus without complications: Secondary | ICD-10-CM

## 2024-01-12 DIAGNOSIS — Z7901 Long term (current) use of anticoagulants: Secondary | ICD-10-CM

## 2024-01-12 DIAGNOSIS — I10 Essential (primary) hypertension: Secondary | ICD-10-CM

## 2024-01-12 DIAGNOSIS — K436 Other and unspecified ventral hernia with obstruction, without gangrene: Secondary | ICD-10-CM

## 2024-01-12 DIAGNOSIS — K559 Vascular disorder of intestine, unspecified: Secondary | ICD-10-CM

## 2024-01-12 LAB — PROTIME AND INR
BKR INR: 1.61 — ABNORMAL HIGH (ref 0.90–1.10)
BKR PROTHROMBIN TIME: 16.9 s — ABNORMAL HIGH (ref 9.0–12.0)

## 2024-01-12 NOTE — Progress Notes
 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  The patient was contacted regarding lab drawn INR results.  Patient's INR findings and prescribed dose from last visit:  11/17/2023   4:19 PM 12/21/2023   8:51 AM 01/12/2024   3:02 PM Antico[facility] Monitoring Assoc[facility] INR Date 11/17/2023 12/20/2023 01/12/2024 Associated INR 2.20 3.06 1.61 Instructions 5 mg every Tue, Thu; 7.5 mg all other days 5 mg every Tue, Thu; 7.5 mg all other days 7.5 mg every day Sunday Dose 7.5 mg 7.5 mg 7.5 mg Monday Dose 7.5 mg 7.5 mg 7.5 mg Tuesday Dose 5 mg 5 mg 7.5 mg Wednesday Dose 7.5 mg 7.5 mg 7.5 mg Thursday Dose 5 mg 5 mg 7.5 mg Friday Dose 7.5 mg 7.5 mg 7.5 mg Saturday Dose 7.5 mg 7.5 mg 7.5 mg Last 7 day dose total 47.5 mg 47.5 mg 47.5 mg Next INR Date 12/08/2023 01/06/2024 01/19/2024 The following findings were reported from today's call (no findings if none selected):[]  Missed doses []  Extra doses []  Change in medications []  Change in vitamin k rich food intake (green vegetables, herbals, etc) []  Change in alcohol use []  Change in overall health []  Recent hospitalization / emergency department visit []  Upcoming procedure (including dental procedures) [x]  Other Comments:       Patient was on vacation and may have missed a dose The following side effects were reported from today's call (no side effects if none selected):[]  Bleeding []  New bruising []  Warfarin-emergency department visit or hospital admission []  Other Comments:   No side effects Assessment/Plan:Patient's INR is 1.6, which is below goal range of 2.0-3.0.  Patient was on vacation and may have missed a dose.  She was instructed to increase weekly dose by 10% as indicated in the maintenance plan below.  Next INR assessment due 1 week.  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  Electronically Signed by Coy Lucienne, RPH, January 12, 2024 [facility] Anticoagulation Clinic  [address] [place], [place] [address]  Phone: [phone]  Fax: [phone]

## 2024-01-13 ENCOUNTER — Ambulatory Visit: Admit: 2024-01-13 | Payer: PRIVATE HEALTH INSURANCE | Primary: Internal Medicine

## 2024-01-14 ENCOUNTER — Encounter: Admit: 2024-01-14 | Payer: PRIVATE HEALTH INSURANCE | Primary: Internal Medicine

## 2024-01-14 DIAGNOSIS — Z5181 Encounter for therapeutic drug level monitoring: Secondary | ICD-10-CM

## 2024-01-14 DIAGNOSIS — Z86718 Personal history of other venous thrombosis and embolism: Secondary | ICD-10-CM

## 2024-01-14 DIAGNOSIS — D6862 Lupus anticoagulant syndrome: Principal | ICD-10-CM

## 2024-01-20 ENCOUNTER — Telehealth: Admit: 2024-01-20 | Payer: PRIVATE HEALTH INSURANCE | Primary: Internal Medicine

## 2024-01-20 NOTE — Telephone Encounter
 This morning I contacted patient, Maria [name] to remind her that she is due for her INR.  I spoke directly to the patient and she stated that she is going tomorrow afternoon for bloodwork.  Last documented INR is from 9/17.  This is the first reminder phone call.Tully GORMAN Running, CPHT9/25/202510:38 AMSmilow [facility]

## 2024-01-21 ENCOUNTER — Encounter: Admit: 2024-01-21 | Payer: PRIVATE HEALTH INSURANCE | Primary: Internal Medicine

## 2024-01-21 DIAGNOSIS — D6862 Lupus anticoagulant syndrome: Principal | ICD-10-CM

## 2024-01-21 DIAGNOSIS — Z86718 Personal history of other venous thrombosis and embolism: Secondary | ICD-10-CM

## 2024-01-21 DIAGNOSIS — Z5181 Encounter for therapeutic drug level monitoring: Secondary | ICD-10-CM

## 2024-01-25 ENCOUNTER — Inpatient Hospital Stay: Admit: 2024-01-25 | Discharge: 2024-01-25 | Payer: Medicare (Managed Care) | Primary: Internal Medicine

## 2024-01-25 ENCOUNTER — Encounter: Admit: 2024-01-25 | Payer: Medicare (Managed Care) | Primary: Internal Medicine

## 2024-01-25 ENCOUNTER — Ambulatory Visit: Admit: 2024-01-25 | Payer: PRIVATE HEALTH INSURANCE | Attending: Ophthalmology | Primary: Internal Medicine

## 2024-01-25 ENCOUNTER — Ambulatory Visit: Admit: 2024-01-25 | Payer: Medicare (Managed Care) | Attending: Hematology & Oncology | Primary: Internal Medicine

## 2024-01-25 DIAGNOSIS — E119 Type 2 diabetes mellitus without complications: Secondary | ICD-10-CM

## 2024-01-25 DIAGNOSIS — Z7901 Long term (current) use of anticoagulants: Secondary | ICD-10-CM

## 2024-01-25 DIAGNOSIS — H35361 Drusen (degenerative) of macula, right eye: Secondary | ICD-10-CM

## 2024-01-25 DIAGNOSIS — Z5181 Encounter for therapeutic drug level monitoring: Secondary | ICD-10-CM

## 2024-01-25 DIAGNOSIS — D6862 Lupus anticoagulant syndrome: Secondary | ICD-10-CM

## 2024-01-25 DIAGNOSIS — H25813 Combined forms of age-related cataract, bilateral: Principal | ICD-10-CM

## 2024-01-25 DIAGNOSIS — Z86718 Personal history of other venous thrombosis and embolism: Secondary | ICD-10-CM

## 2024-01-25 DIAGNOSIS — H04123 Dry eye syndrome of bilateral lacrimal glands: Secondary | ICD-10-CM

## 2024-01-25 LAB — CBC WITH AUTO DIFFERENTIAL
BKR WAM ABSOLUTE IMMATURE GRANULOCYTES.: 0.03 x 1000/ÂµL (ref 0.00–0.30)
BKR WAM ABSOLUTE LYMPHOCYTE COUNT.: 1.87 x 1000/ÂµL (ref 0.60–3.70)
BKR WAM ABSOLUTE NRBC: 0 x 1000/ÂµL (ref 0.00–1.00)
BKR WAM ANC (ABSOLUTE NEUTROPHIL COUNT): 7.29 x 1000/ÂµL (ref 2.00–7.60)
BKR WAM BASOPHIL ABSOLUTE COUNT.: 0.04 x 1000/ÂµL (ref 0.00–1.00)
BKR WAM BASOPHILS: 0.4 % (ref 0.0–1.4)
BKR WAM EOSINOPHIL ABSOLUTE COUNT.: 0.2 x 1000/ÂµL (ref 0.00–1.00)
BKR WAM EOSINOPHILS: 2 % (ref 0.0–5.0)
BKR WAM HEMATOCRIT: 42.7 % (ref 35.00–45.00)
BKR WAM HEMOGLOBIN: 12.8 g/dL (ref 11.7–15.5)
BKR WAM IMMATURE GRANULOCYTES: 0.3 % (ref 0.0–1.0)
BKR WAM LYMPHOCYTES: 18.7 % (ref 17.0–50.0)
BKR WAM MCH: 25 pg — ABNORMAL LOW (ref 27.0–33.0)
BKR WAM MCHC: 30 g/dL — ABNORMAL LOW (ref 31.0–36.0)
BKR WAM MCV: 83.2 fL (ref 80.0–100.0)
BKR WAM MONOCYTE ABSOLUTE COUNT.: 0.57 x 1000/ÂµL (ref 0.00–1.00)
BKR WAM MONOCYTES: 5.7 % (ref 4.0–12.0)
BKR WAM MPV: 11.5 fL (ref 8.0–12.0)
BKR WAM NEUTROPHILS: 72.9 % — ABNORMAL HIGH (ref 39.0–72.0)
BKR WAM NUCLEATED RED BLOOD CELLS: 0 % (ref 0.0–1.0)
BKR WAM PLATELETS: 413 x1000/ÂµL (ref 150–420)
BKR WAM RDW-CV: 16.6 % — ABNORMAL HIGH (ref 11.0–15.0)
BKR WAM RED BLOOD CELL COUNT.: 5.13 M/ÂµL (ref 4.00–6.00)
BKR WAM WHITE BLOOD CELL COUNT: 10 x1000/ÂµL (ref 4.0–11.0)

## 2024-01-25 LAB — PROTIME AND INR
BKR INR: 3.05 — ABNORMAL HIGH (ref 0.90–1.10)
BKR PROTHROMBIN TIME: 30.6 s — ABNORMAL HIGH (ref 9.0–12.0)

## 2024-01-25 NOTE — Patient Instructions
 BOTH EYESArtificial tears 3 times dailyPataday drops once dailyOcusoft lid scrubs

## 2024-01-25 NOTE — Progress Notes
 [facility] Lens Order Maria [name] [birth year] [id number] Surgeon: Dr. Harlene Eaton Selection RIGHT EYE J+J DIB00 + 21.50 D, quantity 2Lens Selection LEFT EYE J+J DIB00 + 20.0 and 20.5 D, quantity 2

## 2024-01-25 NOTE — Progress Notes
 [facility] Preoperative Medical Necessity for Cataract SurgeryPatient: Maria [name]  MR Number: [id number]  Date of Birth: 1950-12-16 Date of Visit: 01/25/2024  Visual Acuity: Visual Acuity   Visual Acuity (Snellen - Linear)     Right Left  Dist cc 20/40 20/30  Correction: Glasses Vision check with phoropter    Laterality: BOTH EYESLimitation of activities of daily living (ADLs)Decreased ability to carry out activities of daily living including (but not limited to):[]   Reading or[x]   Watching Television or[x]   Driving or[]   Meeting Occupational or Vocational Expectations;In addition, one of the following criteria must be met: []   The patient has a best corrected visual acuity of 20/40 or worse at distant or near;[x]   Additional testing shows one of the following:Consensual light testing decreases visual activity by two lines, orGlare testing decreases visual acuity by two lines[x]   The patient is no longer able to function adequately with the current visual function; and []   Other eye disease(s) including macular degeneration and/or diabetic retinopathy have been ruled out as the primary cause of decreased visual function; and[x]   Improvement in visual function can be expected as a result of cataract extraction; and[x]   The	patient has been educated about the risks and benefits of cataract surgery and the alternative(s) to surgery (e.g., avoidance of glare, optimal eyeglass prescription, etc.); and	[x]   The patient has undergone an appropriate preoperative ophthalmologic evaluation that generally includes a comprehensive ophthalmologic exam and ophthalmic biometry.For patients with a best-corrected visual acuity of 20/40 or better, cataract extraction will be considered if all other criteria have been met and there is substantial documentation of the medical necessity of the procedure for that patient. If the decision to perform cataract extraction in both eyes is made prior to the first cataract extraction, the documentation must support the medical necessity for each procedure to be performed. Bilateral cataract extraction performed on both eyes, on the same date of service is termed immediate sequential bilateral cataract surgery (ISBCS). ISBCS as an approach to bilateral cataract extraction may afford certain clinical benefits but carries with it the possibility of bilateral visual loss. The decision to perform ISBCS should be an individual decision, made jointly by the patient and physician.Electronically signed by 01/25/2024 2:28 PM Dynisha Due, Harlene, MD  Medicare coverage for cataract extraction with intraocular lens implant is based on services that are reasonable and medically necessary for the treatment of beneficiaries who have a cataract, and who meet all of the following criteria:The medical record must document the rationale for ISBCS and that the patient has been apprised of the risks and benefits of both this approach and the available alternatives.

## 2024-01-25 NOTE — Progress Notes
 [facility] Surgery Booking Yeraldi Fidler 22-Jul-1950 [id number] [phone] Language: Dorn: Harlene Pinal, MDUrgency: Next AvailablePreferred Location: [facility]Eye[facility] then LEFT EYE Procedure and CPT codes: Cataract extraction with intraocular lens implant (66984)Anesthesia: Local/MACEstimated Surgery Time Requested: 30 minutesAdditional Equipment Requested: BSS+

## 2024-01-25 NOTE — Progress Notes
 Visually significant cataract, both eyes - glare to 20/50 ou with difficulty night drivingNo guttae, no pseudo exfoliation, dilates to 6.5 mm OD and 6.0 mm OSR/B/A discussed with patient including but not limited to bleeding, infection, retinal detachment, corneal edema, retinal edema, need for further surgery, loss of vision, loss of eye. Discussed multifocal lens vs monovision, patient elects for monofocal distance understands she will need reading glasses.Not a candidate for Toric LensDiscussed femtosecond Laser, not interestedWill require BSS+Plan ceiol right eye first left eye 2 weeks later will repeat intraocular lens master after right eye is doneMoving to [place]  in March 2026Small perifoveal macular drusen right eyeDry eyesDiscussed importance of optimizing ocular surface prior to cataract surgeryAllergic conjunctivitis ou -  on Pataday , discussed using pataday  consistently and artificial tears PRN. Don't use Visine. 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 I discussed the case with the resident and agree with the findings and plan as documented in the resident's note.  I personally examined the patient and formulated the plan. I personally discussed with patient and answered all questions.Plan ceiol right eye first left eye 2 weeks laterJessica Nihal Doan, MDYale Eye Center9/30/2025 at 2:25 PM

## 2024-01-26 ENCOUNTER — Ambulatory Visit: Admit: 2024-01-26 | Payer: PRIVATE HEALTH INSURANCE | Primary: Internal Medicine

## 2024-01-26 ENCOUNTER — Encounter: Admit: 2024-01-26 | Payer: PRIVATE HEALTH INSURANCE | Primary: Internal Medicine

## 2024-01-26 DIAGNOSIS — E7800 Pure hypercholesterolemia, unspecified: Secondary | ICD-10-CM

## 2024-01-26 DIAGNOSIS — D6862 Lupus anticoagulant syndrome: Principal | ICD-10-CM

## 2024-01-26 DIAGNOSIS — E119 Type 2 diabetes mellitus without complications: Secondary | ICD-10-CM

## 2024-01-26 DIAGNOSIS — K559 Vascular disorder of intestine, unspecified: Secondary | ICD-10-CM

## 2024-01-26 DIAGNOSIS — K55039 Acute (reversible) ischemia of large intestine, extent unspecified: Secondary | ICD-10-CM

## 2024-01-26 DIAGNOSIS — K436 Other and unspecified ventral hernia with obstruction, without gangrene: Secondary | ICD-10-CM

## 2024-01-26 DIAGNOSIS — I10 Essential (primary) hypertension: Secondary | ICD-10-CM

## 2024-01-26 DIAGNOSIS — F32A Depression: Principal | ICD-10-CM

## 2024-01-26 DIAGNOSIS — L719 Rosacea, unspecified: Secondary | ICD-10-CM

## 2024-01-26 DIAGNOSIS — R42 Dizziness and giddiness: Secondary | ICD-10-CM

## 2024-01-26 NOTE — Progress Notes
 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  The patient was contacted regarding lab drawn INR results.  Patient's INR findings and prescribed dose from last visit:  12/21/2023   8:51 AM 01/12/2024   3:02 PM 01/26/2024   9:42 AM Anticoagulation Monitoring Assoc. INR Date 12/20/2023 01/12/2024 01/25/2024 Associated INR 3.06 1.61 3.05 Instructions 5 mg every Tue, Thu; 7.5 mg all other days 7.5 mg every day 7.5 mg every day Sunday Dose 7.5 mg 7.5 mg 7.5 mg Monday Dose 7.5 mg 7.5 mg 7.5 mg Tuesday Dose 5 mg 7.5 mg 7.5 mg Wednesday Dose 7.5 mg 7.5 mg 7.5 mg Thursday Dose 5 mg 7.5 mg 7.5 mg Friday Dose 7.5 mg 7.5 mg 7.5 mg Saturday Dose 7.5 mg 7.5 mg 7.5 mg Last 7 day dose total 47.5 mg 47.5 mg 52.5 mg Next INR Date 01/06/2024 01/19/2024 02/15/2024 The following findings were reported from today's call (no findings if none selected):[]  Missed doses []  Extra doses []  Change in medications []  Change in vitamin k rich food intake (green vegetables, herbals, etc) []  Change in alcohol use []  Change in overall health []  Recent hospitalization / emergency department visit []  Upcoming procedure (including dental procedures) []  Other Comments:       No findings The following side effects were reported from today's call (no side effects if none selected):[]  Bleeding []  New bruising []  Warfarin-emergency department visit or hospital admission []  Other Comments:   No side effects Assessment/Plan:Patient's INR is 3, which is within goal range of 2.0-3.0.  She was instructed to continue current weekly dose as indicated in the maintenance plan below.  Next INR assessment due 02/15/24.  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  Electronically Signed by Coy Lucienne, RPH, January 26, 2024 [facility] Anticoagulation Clinic  [address] [place], [place] [address]  Phone: [phone]  Fax: [phone]

## 2024-01-27 ENCOUNTER — Ambulatory Visit: Admit: 2024-01-27 | Payer: PRIVATE HEALTH INSURANCE | Primary: Internal Medicine

## 2024-01-27 ENCOUNTER — Inpatient Hospital Stay: Admit: 2024-01-27 | Payer: PRIVATE HEALTH INSURANCE | Primary: Internal Medicine

## 2024-01-27 ENCOUNTER — Encounter: Admit: 2024-01-27 | Payer: PRIVATE HEALTH INSURANCE | Primary: Internal Medicine

## 2024-01-27 VITALS — BP 151/79 | HR 91 | Temp 98.00000°F | Ht <= 58 in | Wt 168.0 lb

## 2024-01-27 DIAGNOSIS — D6862 Lupus anticoagulant syndrome: Secondary | ICD-10-CM

## 2024-01-27 DIAGNOSIS — I10 Essential (primary) hypertension: Secondary | ICD-10-CM

## 2024-01-27 DIAGNOSIS — K55039 Acute (reversible) ischemia of large intestine, extent unspecified: Secondary | ICD-10-CM

## 2024-01-27 DIAGNOSIS — K436 Other and unspecified ventral hernia with obstruction, without gangrene: Secondary | ICD-10-CM

## 2024-01-27 DIAGNOSIS — F32A Depression: Principal | ICD-10-CM

## 2024-01-27 DIAGNOSIS — Z1211 Encounter for screening for malignant neoplasm of colon: Secondary | ICD-10-CM

## 2024-01-27 DIAGNOSIS — E119 Type 2 diabetes mellitus without complications: Secondary | ICD-10-CM

## 2024-01-27 DIAGNOSIS — L719 Rosacea, unspecified: Secondary | ICD-10-CM

## 2024-01-27 DIAGNOSIS — E7800 Pure hypercholesterolemia, unspecified: Secondary | ICD-10-CM

## 2024-01-27 DIAGNOSIS — R42 Dizziness and giddiness: Secondary | ICD-10-CM

## 2024-01-27 DIAGNOSIS — Z800 Family history of malignant neoplasm of digestive organs: Principal | ICD-10-CM

## 2024-01-27 MED ORDER — SODIUM,POTASSIUM,MAG SULFATES 17.5 GRAM-3.13 GRAM-1.6 GRAM ORAL SOLN
17.5-3.13-1.6 | ORAL | 1 refills | 30.00000 days | Status: SS
Start: 2024-01-27 — End: ?

## 2024-01-27 NOTE — Patient Instructions
 COLONOSCOPY INSTRUCTIONS - READ THROUGH THE WEEK OF PROCEDUREIt is very important to follow these instructions for the week before to colonoscopy.IF RULES ARE NOT FOLLOWED PROPERLY: your colonoscopy can be cancelled before or during procedure (after prepping), and you will be asked to repeat colonoscopy within 1 yearInstructions for you specifically, in addition to all instructions belowI recommend picking up your bowel prep as soon as possible so it is in stock. The product will not expire soon, so it is safe to pick up now and have at home until colonoscopy.Medications:- Hold warfarin for 5 days leading up to procedure, lovenox  bridge as directed by hematology- Hold Jardiance  and Januvia the night before and morning of procedure- Decrease lantus  by 20% the night before colonoscopy, hold lispro while not eating anythingColonoscopy Instructions7 Days before Colonoscopy:Arrange for transportation. Someone will need to drive you to and from the procedure, public transportation is NOT permitted. You CANNOT drive the day of your colonoscopy because you receive anesthesia. Stop using fiber supplements (metamucil, benefiber, citrucel, etc.), iron  pills, fish oil, and vitamin E Avoid eating nuts, seeds, and corn starting now until after your colonoscopy. This includes seeds in fruits/vegetables. These are hard to digest and can get stuck in the colon.3 Days before Colonoscopy:Stop taking NSAIDs such as Ibuprofen , Aleve, Naproxen, Advil1 Day before Colonoscopy:CLEAR liquids only - do not need to be colorless but CAN NOT be red or purpleChicken, beef, or vegetable broth Consomm? Sodas Juice popsicles (no red color)Black coffee with no cream or milk, sugar ok Tea with no cream or milk Fruit juices with no pulp Clear Jell-O Sports drinks Clear, hard candy Lemonade and juices with no pulpStart first dose of prep at 6pm - you will have bowel movements for 2-4 hoursRefer to pink sheet for information specific to your prepYou can still drink clear liquids throughout the prep process, but no more within 4 hours of procedureDay of Colonoscopy: Let your ride know you will be in the building for 2.5 - 3 hoursUpon waking, you may continue the clear liquid diet until 4 hours before your procedure. No solid foods, and nothing by mouth is allowed AT ALL within 4 hours of your procedure time. If you have food or liquid within 4 hours, you are not safe for anesthesia and your appointment can be cancelled.If applicable, take your blood pressure and heart medications with a small sip of water  at least 30 minutes prior to drinking the second dose of prep.Your next dose of prep is 6 hours prior to your appointment: Drink the remaining half of your laxative, and finish within the hour. You need to finish this entire dose, even if your stool is clear. This flushes out any stool stuck to your colon so we can see everythingStop drinking all fluids 4 hours before your appointment time.Common symptoms you may experience that should decrease over the course of the bowel preparation: bloating, nausea, abdominal cramps, and/or chillsTIPS for DRINKING COLONOSCOPY PREP (IF LIQUID)To improve the taste of the bowel preparation, try: Chilling it before drinking Adding a citrus-flavored powdered drink packet if one was provided by the pharmacyLicking a lemon or lime wedge dipped in sugar or hard candy between sips Using a strawCan eat lemon italian ice before drinking prep (to numb the tongue) and after (to make the flavor go away)Instead of diluting concentrate with water , you can dilute with lemonade, WHITE grape juice, or WHITE cranberry juice

## 2024-01-27 NOTE — Progress Notes
 [facility]ologyMyron Brand, MD Maria Hedger, MD Maria Rase, MD  Maria Fries PA-C Maria Hector, MD Maria [place], MD Maria Carbo, MD  Maria Nettle APRN  Maria Mau, MD Maria Bathe, MD  Maria Counts, MD  Maria Bring APRN  Maria Perry, MD Maria Jointer, MD  Maria Bolognese, MD  Maria Rummer APRN Maria Hong-Curtis, MD Maria Jourdain, MD  Maria Infante, MD  Maria Dolores APRN Maria Remington, MD Maria Pinal, MD    Maria Just, MD Maria Slade, MD    Patient referred ab:Maria [name] Complaint: Maria [name] is here prior to screening colonoscopy.History of Present Illness:Maria [name] is a 73 y.o. 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 Her last colonoscopy was on 12/21/2018 and was notable for internal hemorrhoids and healthy appearing anastomosis. Maria [name] reports feeling well GI wise, besides complaints of upper abdominal bloating.  Bloating is described in areas where she previously had colostomy hernia repair.  No specific foods seem to trigger bloating and symptoms occur frequently.  She had a bowel resection in 2015 with a temporary colostomy bag was taken down 10 months later.  Currently she has bowel movements 1-2 times per day that are normal in consistency and do not require straining.  She denies hematochezia, melena, and unintentional weight loss.She has a family history of colon cancer in her father, sister, brother, paternal aunt, and cousin, all diagnosed at various ages.  She underwent genetic testing in 2020 and workup came back negative for known detectable mutations. She denies personal history of colon polyps, IBD and GI cancer. She denies the following symptoms: abdominal pain, n/v/d/c, change in bowel habits, brbpr, melena, fevers, chills, unexplained weight loss.She  has a past surgical history that includes Knee ligament reconstruction (Right, 2004); Laparoscopic Nissen fundoplication; Umbilical hernia repair; Ventral hernia repair; Tubal ligation; THYROID BIOPSY; Colostomy (05/27/13); Sinus surgery; Abdominal adhesion surgery (2016); Blepharoplasty (Bilateral, 05/2016); Colostomy closure; and Hysteroscopy w/ polypectomy (06/18/2020).Additional concerns:Antiplatelet or anticoagulant use:  yes - warfarinOpiate use:  noPersonal history of getting radiation to the abdomen or pelvic area to treat a prior cancer?  noSignificant cardiac comorbidities (MI in the last 6 months, drug-eluting stent < 6 months, severe AS, EF<35%, pHTN)?  noUse a defibrillator?  noOxygen dependence > 2L/min? noStroke in the last 6 months?  noIf history of seizures, seizure-free for >2 weeks?  not applicableHistory of problem with anesthesia?  noKnown Family History:Colorectal Cancer: yes - father age 39, sister age 75, brother age 19, paternal aunt age 60, paternal cousin age 39Gastric Cancer, Liver Cancer, or other GI malignancies: noIBD: yes - sister with Crohn's diseaseCeliac disease: noFamily History Problem Relation Age of Onset  Bladder cancer Mother   Colon cancer Father 25  Diabetes Father   Diabetes Sister   Colon cancer Sister 71  Colon cancer Brother 88  Colon cancer Paternal Aunt 36  Diabetes Paternal Uncle   Breast cancer Maternal Cousin       2 cousins with breast cancer  Colon cancer Paternal Cousin 61 Previous GI Procedures include the following: EGD: 09/25/2010 for dyspepsia, s/p H pylori treatment- gastric erythema, minimal chronic gastritis, no H pylori, no GIMEGD 11/26/2002 s/p fundoplication: H pylori gastritis, no IMLast Colonoscopy:  12/21/2018 with Dr. Ozell Pfeiffer- internal hemorrhoids, end-to-side ileocolonic anastomosis with healthy-appearing mucosa Colonoscopy 09/25/2010: Int/ext hemorrhoids, no polyps Colonoscopy 06/23/2006:  3 dim polyps of colonic mucosaVentral hernia repair 12/02/2015: Ventral hernia with extensive intra-abdominal adhesions Colostomy takedown 02/21/2014: inflammatory polyps at anastomosis Recall: Colonoscopy 08/26/2025Past Medical History:Past Medical History[1]Past Surgical History:Past Surgical History[2]Current Medications:Medications Ordered Prior to Encounter[3]Allergies:Allergies[4]Social History:Social History Tobacco Use  Smoking status: Never  Smokeless tobacco: Never Substance Use Topics  Alcohol use: Not Currently   Alcohol/week: 1.0 standard drink of alcohol   Types: 1 Glasses of wine per week   Comment: 1-2/year General Review of Systems:10 point review of systems negative except as noted above in HPI.Physical Exam:Vital Signs BP (!) 151/79 (Site: r a, Position: Sitting)  - Pulse (!) 91  - Temp 98 ?F (36.7 ?C)  - Ht 4' 10 (1.473 m)  - Wt 76.2 kg  - SpO2 99%  - BMI 35.11 kg/m? 	BMI Body mass index is 35.11 kg/m?SABRAWeight: Wt Readings from Last 3 Encounters: 01/27/24 76.2 kg 11/04/23 74.8 kg 10/12/23 76.1 kg General appearance - alert, well appearing, and in no distressMental status - alert, oriented to person, place, and timeChest - clear to auscultation, no wheezes, rales or rhonchi, symmetric air entryHeart - Rhythm regular.  Normal S1 and S2.  No murmurs, gallops, or rubs.Abdomen - soft, nontender, nondistended, no masses or organomegalyNeurological - alert, oriented, normal speech, no focal findings or movement disorder notedIMAGING:No results found.No results found. LABS:Lab Results Component Value Date  WBC 10.0 01/25/2024  HGB 12.8 01/25/2024  HCT 42.70 01/25/2024  MCV 83.2 01/25/2024  PLT 413 01/25/2024 Lab Results Component Value Date  NA 138 12/20/2023  K 4.8 12/20/2023  CL 101 12/20/2023  CO2 29 12/20/2023  CREATININE 0.88 12/20/2023  GLU 119 (H) 12/20/2023  EGFRCREBLD >60 12/20/2023 Lab Results Component Value Date  BILITOT 0.3 12/20/2023  BILIDIR <0.2 01/01/2023  AST 25 12/20/2023  ALT 20 12/20/2023  PROT 7.0 12/20/2023  ALKPHOS 132 (H) 12/20/2023  ALBUMIN 3.9 12/20/2023 Lab Results Component Value Date  INR 3.05 (H) 01/25/2024 Lab Results Component Value Date  TSH 0.861 10/12/2023 Assessment and Plan:Aisha Drennon is a 73 y.o. 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  Plan as otherwise below:1. Colon cancer screening and Family history of colon cancer - Her last colonoscopy was on 12/21/2018 and was notable for internal hemorrhoids and healthy appearing anastomosis. Emphasized the importance of thorough bowel preparation to ensure no polyps are missed. Discussed dietary restrictions and clear liquid diet prior to the procedure. Inadequate preparation may necessitate an earlier repeat colonoscopy.- Colonoscopy ordered with Suprep at [facility], [place] - [address]- Hold warfarin for 5 days leading up to procedure, lovenox  bridge as directed by hematology- Hold Jardiance  and Januvia the night before and morning of procedure- Decrease lantus  by 20% the night before colonoscopy, hold lispro while NPO- Discussed prep instructions in detail, written instructions provided2. Bloating - sensation of bloating in the upper abdomen where she has had previous hernia repair and colostomy.  She does have a sensitivity to lactose I provided her with a a FODMAP diet sheet to identify trigger foods that may be worsening her symptoms.The patient was counseled on what a colonoscopy entails, risks vs benefits (risks include perforation, possible need for surgery, aspiration, sedation, bleeding, missed lesions and infection), how it is performed, associated preparation, and need for escort on the day of the procedure.  All questions were answered. The patient is amenable to proceed with colonoscopy.Thank you very much for involving me in her care.  Please do not hesitate to contact me with any questions.  All the benjaman Maria [name], APRNDigestive DiseaseYale School of Medicine(Please note that portions of this note may have been completed with a voice recognition program.  Efforts were made to edit the dictation, but occasionally words maybe mis-transcribed.)Electronically Signed by Maria Nettle, APRN, January 26, 2024 [1] Past Medical History:Diagnosis Date  Acute ischemic colitis (HC Code) 2015  Depression   Diabetes mellitus (HC CODE)   Diabetes mellitus, type II (HC CODE)   Hypercholesteremia   Hypertension   Lupus anticoagulant disorder (HC Code)   Rosacea   Ventral hernia with bowel obstruction   Vertigo  [2] Past Surgical History:Procedure Laterality Date  ABDOMINAL ADHESION SURGERY  2016  BLEPHAROPLASTY Bilateral 05/2016  COLOSTOMY  05/27/13  COLOSTOMY CLOSURE    HYSTEROSCOPY W/ POLYPECTOMY  06/18/2020  KNEE LIGAMENT RECONSTRUCTION Right 2004  LAPAROSCOPIC NISSEN FUNDOPLICATION    2000 ?  SINUS SURGERY    THYROID BIOPSY    TUBAL LIGATION    UMBILICAL HERNIA REPAIR    VENTRAL HERNIA REPAIR   [3] Current Outpatient Medications on File Prior to Visit Medication Sig Dispense Refill  acetaminophen  (TYLENOL ) 500 mg tablet Take 1 tablet (500 mg total) by mouth every 4 (four) hours as needed. 30 tablet 11  biotin 1 mg tablet Take 1 tablet (1,000 mcg total) by mouth daily.    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 Contour 1 each 0  blood sugar diagnostic (CONTOUR TEST STRIPS) test strips Use to check blood sugar 3 times per day (Type 2 Diabetes: E11.9 on insulin ). Accu-chek 300 each 11  blood-glucose sensor (FREESTYLE LIBRE 3 PLUS SENSOR) device Apply a new sensor every 15 days E11.9 on insulin  6 each 3  cholecalciferol, vitamin D3, 125 mcg (5,000 unit) tablet Take 1 tablet (5,000 Units total) by mouth every other day.    diphenhydramine  HCl (BENADRYL  ALLERGY  ORAL) Take 1 tablet by mouth as needed.    emollient (BIAFINE) cream Apply topically 2 (two) times daily. (Patient not taking: Reported on 06/30/2023) 454 g 0  FREESTYLE LIBRE 3 READER Misc Use as directed. 1 each 0  gabapentin  (NEURONTIN ) 300 mg capsule Take 1 capsule (300 mg total) by mouth nightly.    insulin  glargine (LANTUS ) 100 unit/mL vial Inject 10 Units under the skin nightly.    insulin  lispro (HUMALOG  KWIKPEN INSULIN ) 100 unit/mL pen Inject 2 units with smaller carb meals and 4 units with larger carb meals 15 mL 2  JARDIANCE  25 mg tablet Take 1 tablet (25 mg total) by mouth daily.    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 Accu-chek 300 each 11  losartan  (COZAAR ) 25 mg tablet Take 1 tablet (25 mg total) by mouth daily.    meclizine (ANTIVERT) 12.5 mg tablet Take 1 tablet (12.5 mg total) by mouth daily as needed for Dizziness 30 tablet 1  melatonin 10 mg Tab Take 10 mg by mouth nightly as needed.     multivitamin tablet Take 1 tablet by mouth daily.    NANO 2ND GEN PEN NEEDLE 32 gauge x 5/32 USE WITH INSULIN  PEN UP TO 4 TIMES PER DAY 100 each 0  olopatadine  (PATADAY ) 0.2 % ophthalmic solution Place 1 drop into both eyes  daily. 2.5 mL 6  ondansetron  (ZOFRAN ) 4 MG tablet Take 1 tablet (4 mg total) by mouth every 8 (eight) hours as needed for nausea.    phytonadione, vit K1, (VITAMIN K) 100 mcg tablet Take 1 tablet (100 mcg total) by mouth daily.    pravastatin  (PRAVACHOL ) 40 MG tablet Take 1.5 tablets (60 mg total) by mouth nightly.    QUEtiapine  (SEROQUEL ) 50 mg Immediate Release tablet Take 1 tablet (50 mg total) by mouth nightly.    senna (SENOKOT) 8.6 mg tablet Take 2 tablets by mouth nightly.. 30 tablet 11  SITagliptin phosphate (JANUVIA) 100 mg tablet Take 1 tablet (100 mg total) by mouth daily. 90 tablet 3  venlafaxine  (EFFEXOR  XR) 150 mg XR 24 hr extended release capsule Take 1 capsule (150 mg total) by mouth daily.    warfarin (COUMADIN ) 5 mg tablet Take 5 mg on Tuesday, Thursday and 7.5 mg all other days of the week or as directed by the warfarin clinic. 100 tablet 2 No current facility-administered medications on file prior to visit. [4] AllergiesAllergen Reactions  Morphine  Hives and Itching   Pt tolerated morphine  during admission 08/08/16-08/09/16.  Pt premedicated with diphenhydramine   Biaxin  [Clarithromycin]   Latex, Natural Rubber Rash  Mitosol [Mitomycin] Rash  Oxycodone  Rash

## 2024-01-28 ENCOUNTER — Encounter: Admit: 2024-01-28 | Payer: PRIVATE HEALTH INSURANCE | Primary: Internal Medicine

## 2024-01-28 DIAGNOSIS — Z5181 Encounter for therapeutic drug level monitoring: Secondary | ICD-10-CM

## 2024-01-28 DIAGNOSIS — Z86718 Personal history of other venous thrombosis and embolism: Secondary | ICD-10-CM

## 2024-01-28 DIAGNOSIS — D6862 Lupus anticoagulant syndrome: Principal | ICD-10-CM

## 2024-01-28 NOTE — Progress Notes
 Hematology clearance obtained on 01/28/2024.  Okay to hold warfarin for five days prior to colonoscopy.Electronically Signed by Rosina Nettle, APRN, January 28, 2024

## 2024-01-28 NOTE — Progress Notes
 Received a fax from [facility]. They are requesting that the patient hold coumadin  for 5 days prior to her colonoscopy. Procedure is scheduled for 02/11/2024. Dr. Gladis has okay'd the holding of coumadin  for 5 days without needing to bridge. Faxed complete form to [phone]

## 2024-02-01 ENCOUNTER — Inpatient Hospital Stay: Admit: 2024-02-01 | Payer: PRIVATE HEALTH INSURANCE | Primary: Internal Medicine

## 2024-02-01 ENCOUNTER — Telehealth: Admit: 2024-02-01 | Payer: PRIVATE HEALTH INSURANCE | Attending: Ophthalmology | Primary: Internal Medicine

## 2024-02-01 NOTE — Telephone Encounter
 TC to patient to schedule surgeries with Dr. Meredeth before [place] of [place] at the end of March 2026. Patient requested surgery instructions be mailed to home (address confirmed) and patient will call me within two weeks if not received.

## 2024-02-03 ENCOUNTER — Ambulatory Visit: Admit: 2024-02-03 | Payer: PRIVATE HEALTH INSURANCE | Primary: Internal Medicine

## 2024-02-03 ENCOUNTER — Inpatient Hospital Stay
Admission: EM | Admit: 2024-02-03 | Discharge: 2024-02-06 | Payer: Medicare (Managed Care) | Attending: Trauma Surgery | Admitting: Surgical Critical Care

## 2024-02-03 ENCOUNTER — Encounter: Admit: 2024-02-03 | Payer: PRIVATE HEALTH INSURANCE | Primary: Internal Medicine

## 2024-02-03 ENCOUNTER — Emergency Department: Admit: 2024-02-03 | Payer: Medicare (Managed Care) | Primary: Internal Medicine

## 2024-02-03 ENCOUNTER — Telehealth: Admit: 2024-02-03 | Payer: PRIVATE HEALTH INSURANCE | Primary: Internal Medicine

## 2024-02-03 ENCOUNTER — Inpatient Hospital Stay: Admit: 2024-02-03 | Payer: Medicare (Managed Care)

## 2024-02-03 DIAGNOSIS — F32A Depression: Principal | ICD-10-CM

## 2024-02-03 DIAGNOSIS — K56609 Unspecified intestinal obstruction, unspecified as to partial versus complete obstruction: Principal | ICD-10-CM

## 2024-02-03 DIAGNOSIS — K559 Vascular disorder of intestine, unspecified: Secondary | ICD-10-CM

## 2024-02-03 DIAGNOSIS — I10 Essential (primary) hypertension: Secondary | ICD-10-CM

## 2024-02-03 DIAGNOSIS — R42 Dizziness and giddiness: Secondary | ICD-10-CM

## 2024-02-03 DIAGNOSIS — D6862 Lupus anticoagulant syndrome: Principal | ICD-10-CM

## 2024-02-03 DIAGNOSIS — E7800 Pure hypercholesterolemia, unspecified: Secondary | ICD-10-CM

## 2024-02-03 DIAGNOSIS — K436 Other and unspecified ventral hernia with obstruction, without gangrene: Secondary | ICD-10-CM

## 2024-02-03 DIAGNOSIS — L719 Rosacea, unspecified: Secondary | ICD-10-CM

## 2024-02-03 DIAGNOSIS — E119 Type 2 diabetes mellitus without complications: Secondary | ICD-10-CM

## 2024-02-03 DIAGNOSIS — K55039 Acute (reversible) ischemia of large intestine, extent unspecified: Secondary | ICD-10-CM

## 2024-02-03 LAB — CBC WITH AUTO DIFFERENTIAL
BKR WAM ABSOLUTE IMMATURE GRANULOCYTES.: 0.09 x 1000/ÂµL (ref 0.00–0.30)
BKR WAM ABSOLUTE LYMPHOCYTE COUNT.: 1.41 x 1000/ÂµL (ref 0.60–3.70)
BKR WAM ABSOLUTE NRBC: 0 x 1000/ÂµL (ref 0.00–1.00)
BKR WAM ANC (ABSOLUTE NEUTROPHIL COUNT): 15.38 x 1000/ÂµL — ABNORMAL HIGH (ref 2.00–7.60)
BKR WAM BASOPHIL ABSOLUTE COUNT.: 0.06 x 1000/ÂµL (ref 0.00–1.00)
BKR WAM BASOPHILS: 0.3 % (ref 0.0–1.4)
BKR WAM EOSINOPHIL ABSOLUTE COUNT.: 0.14 x 1000/ÂµL (ref 0.00–1.00)
BKR WAM EOSINOPHILS: 0.8 % (ref 0.0–5.0)
BKR WAM HEMATOCRIT: 42.4 % (ref 35.00–45.00)
BKR WAM HEMOGLOBIN: 13.2 g/dL (ref 11.7–15.5)
BKR WAM IMMATURE GRANULOCYTES: 0.5 % (ref 0.0–1.0)
BKR WAM LYMPHOCYTES: 7.9 % — ABNORMAL LOW (ref 17.0–50.0)
BKR WAM MCH: 25.7 pg — ABNORMAL LOW (ref 27.0–33.0)
BKR WAM MCHC: 31.1 g/dL (ref 31.0–36.0)
BKR WAM MCV: 82.5 fL (ref 80.0–100.0)
BKR WAM MONOCYTE ABSOLUTE COUNT.: 0.83 x 1000/ÂµL (ref 0.00–1.00)
BKR WAM MONOCYTES: 4.6 % (ref 4.0–12.0)
BKR WAM MPV: 10.6 fL (ref 8.0–12.0)
BKR WAM NEUTROPHILS: 85.9 % — ABNORMAL HIGH (ref 39.0–72.0)
BKR WAM NUCLEATED RED BLOOD CELLS: 0 % (ref 0.0–1.0)
BKR WAM PLATELETS: 367 x1000/ÂµL (ref 150–420)
BKR WAM RDW-CV: 16.9 % — ABNORMAL HIGH (ref 11.0–15.0)
BKR WAM RED BLOOD CELL COUNT.: 5.14 M/ÂµL (ref 4.00–6.00)
BKR WAM WHITE BLOOD CELL COUNT: 17.9 x1000/ÂµL — ABNORMAL HIGH (ref 4.0–11.0)

## 2024-02-03 LAB — BASIC METABOLIC PANEL
BKR ANION GAP: 9 (ref 7–17)
BKR BLOOD UREA NITROGEN: 14 mg/dL (ref 8–23)
BKR BUN / CREAT RATIO: 17.5 (ref 8.0–23.0)
BKR CALCIUM: 9.6 mg/dL (ref 8.8–10.2)
BKR CHLORIDE: 96 mmol/L — ABNORMAL LOW (ref 98–107)
BKR CO2: 28 mmol/L (ref 20–30)
BKR CREATININE DELTA: -0.2
BKR CREATININE: 0.8 mg/dL (ref 0.40–1.30)
BKR EGFR, CREATININE (CKD-EPI 2021): >60 mL/min/1.73m2 (ref >=60)
BKR GLUCOSE: 165 mg/dL — ABNORMAL HIGH (ref 70–100)
BKR POTASSIUM: 4.9 mmol/L (ref 3.3–5.3)
BKR SODIUM: 133 mmol/L — ABNORMAL LOW (ref 136–144)

## 2024-02-03 LAB — LACTIC ACID, PLASMA (REFLEX 2H REPEAT): BKR LACTATE: 1.3 mmol/L (ref 0.4–2.0)

## 2024-02-03 LAB — HEPATIC FUNCTION PANEL
BKR A/G RATIO: 1 (ref 1.0–2.2)
BKR ALANINE AMINOTRANSFERASE (ALT): 19 U/L (ref 10–35)
BKR ALBUMIN: 4.1 g/dL (ref 3.6–5.1)
BKR ALKALINE PHOSPHATASE: 120 U/L (ref 9–122)
BKR ASPARTATE AMINOTRANSFERASE (AST): 28 U/L (ref 10–35)
BKR AST/ALT RATIO: 1.5
BKR BILIRUBIN TOTAL: 0.6 mg/dL (ref <=1.2)
BKR GLOBULIN: 4.1 g/dL — ABNORMAL HIGH (ref 2.0–3.9)
BKR PROTEIN TOTAL: 8.2 g/dL (ref 5.9–8.3)

## 2024-02-03 LAB — PROTIME AND INR
BKR INR: 1.69 — ABNORMAL HIGH (ref 0.90–1.10)
BKR PROTHROMBIN TIME: 17.3 s — ABNORMAL HIGH (ref 9.0–12.0)

## 2024-02-03 LAB — LIPASE: BKR LIPASE: 27 U/L (ref 11–55)

## 2024-02-03 MED ORDER — HYDROMORPHONE 1 MG/ML INJECTION SYRINGE
1 | Freq: Once | SUBCUTANEOUS | Status: DC
Start: 2024-02-03 — End: 2024-02-03

## 2024-02-03 MED ORDER — ENOXAPARIN 40 MG/0.4 ML SUBCUTANEOUS SYRINGE
40 | SUBCUTANEOUS | Status: DC
Start: 2024-02-03 — End: 2024-02-04
  Administered 2024-02-04: 09:00:00 40 via SUBCUTANEOUS

## 2024-02-03 MED ORDER — FAMOTIDINE 4 MG/ML IN 0.9% SODIUM CHLORIDE (ADULT)
INTRAVENOUS | Status: DC
Start: 2024-02-03 — End: 2024-02-05
  Administered 2024-02-04 – 2024-02-05 (×2): 5.000 mL via INTRAVENOUS

## 2024-02-03 MED ORDER — LACTATED RINGERS IV BOLUS NEW BAG (DROPS CHARGE)
Freq: Once | INTRAVENOUS | Status: CP
Start: 2024-02-03 — End: ?
  Administered 2024-02-03: 22:00:00 500.000 mL/h via INTRAVENOUS

## 2024-02-03 MED ORDER — LACTATED RINGERS INTRAVENOUS SOLUTION
INTRAVENOUS | Status: DC
Start: 2024-02-03 — End: 2024-02-05
  Administered 2024-02-03 – 2024-02-05 (×3): 1000.000 mL/h via INTRAVENOUS

## 2024-02-03 MED ORDER — SODIUM CHLORIDE 0.9 % (FLUSH) INJECTION SYRINGE
0.9 | Freq: Three times a day (TID) | INTRAVENOUS | Status: DC
Start: 2024-02-03 — End: 2024-02-06
  Administered 2024-02-03: 21:00:00 0.9 mL via INTRAVENOUS

## 2024-02-03 MED ORDER — HYDROMORPHONE 1 MG/ML INJECTION SYRINGE
1 | INTRAVENOUS | Status: DC | PRN
Start: 2024-02-03 — End: 2024-02-06
  Administered 2024-02-04 – 2024-02-05 (×3): 1 mL via INTRAVENOUS

## 2024-02-03 MED ORDER — ONDANSETRON HCL (PF) 4 MG/2 ML INJECTION SOLUTION
4 | Freq: Once | INTRAVENOUS | Status: CP
Start: 2024-02-03 — End: ?
  Administered 2024-02-03: 16:00:00 4 mL via INTRAVENOUS

## 2024-02-03 MED ORDER — HYDROMORPHONE 1 MG/ML INJECTION SYRINGE
1 | Freq: Once | INTRAVENOUS | Status: CP
Start: 2024-02-03 — End: ?
  Administered 2024-02-03: 16:00:00 1 mL via INTRAVENOUS

## 2024-02-03 MED ORDER — HYDROMORPHONE 1 MG/ML INJECTION SYRINGE
1 | INTRAVENOUS | Status: CP | PRN
Start: 2024-02-03 — End: ?
  Administered 2024-02-03 (×2): 1 mL via INTRAVENOUS

## 2024-02-03 MED ORDER — SODIUM CHLORIDE 0.9 % LARGE VOLUME SYRINGE FOR AUTOINJECTOR
Freq: Once | INTRAVENOUS | Status: CP | PRN
Start: 2024-02-03 — End: ?
  Administered 2024-02-03: 20:00:00 via INTRAVENOUS

## 2024-02-03 MED ORDER — HYDROMORPHONE 1 MG/ML INJECTION SYRINGE
1 | INTRAVENOUS | Status: DC | PRN
Start: 2024-02-03 — End: 2024-02-06
  Administered 2024-02-04 – 2024-02-05 (×3): 1 mL via INTRAVENOUS

## 2024-02-03 MED ORDER — SODIUM CHLORIDE 0.9 % IV BOLUS NEW BAG (DROPS CHARGE)
0.9 | Freq: Once | INTRAVENOUS | Status: CP
Start: 2024-02-03 — End: ?
  Administered 2024-02-03: 16:00:00 0.9 mL/h via INTRAVENOUS

## 2024-02-03 MED ORDER — HYDROMORPHONE 1 MG/ML INJECTION SYRINGE
1 | SUBCUTANEOUS | Status: DC | PRN
Start: 2024-02-03 — End: 2024-02-03

## 2024-02-03 MED ORDER — ACETAMINOPHEN 325 MG TABLET
325 | Freq: Four times a day (QID) | ORAL | Status: DC
Start: 2024-02-03 — End: 2024-02-06
  Administered 2024-02-05 (×2): 325 mg via ORAL

## 2024-02-03 MED ORDER — IOHEXOL 350 MG IODINE/ML INTRAVENOUS SOLUTION
350 | Freq: Once | INTRAVENOUS | Status: CP | PRN
Start: 2024-02-03 — End: ?
  Administered 2024-02-03: 20:00:00 350 mL via INTRAVENOUS

## 2024-02-03 MED ORDER — SODIUM CHLORIDE 0.9 % (FLUSH) INJECTION SYRINGE
0.9 | INTRAVENOUS | Status: DC | PRN
Start: 2024-02-03 — End: 2024-02-06

## 2024-02-03 MED ORDER — ONDANSETRON HCL (PF) 4 MG/2 ML INJECTION SOLUTION
4 | Freq: Three times a day (TID) | INTRAVENOUS | Status: DC | PRN
Start: 2024-02-03 — End: 2024-02-05
  Administered 2024-02-03: 23:00:00 4 mL via INTRAVENOUS

## 2024-02-03 NOTE — Utilization Review (ED)
 Utilization Review from XsolisPatient Data:  Patient Name: Maria [name] Age: 73 y.o. DOB: March 22, 1951	 MRN: [id number]	 Indicated Status: Inpatient Review Comments: 73 YO - bowel Obstruction,  Computed Tomography of Abdomen & Pelvis Blood Count, Metabolic Panel,  Surgery Consulted.  NG Tube Insertion, Connected to LWCS.   Plan evolving per assessments and diagnostics.

## 2024-02-03 NOTE — Treatment Summary
 Serial Abdominal ExamTime: 10:30Interval Events:Feeling less nauseous after NGTPain improved with medication NGT with 700cc bilious outputNGT likely post-pyloric per XR; withdrew 10cm with additional 200cc fluid output and subjective improvement in nauseaLactate 1.3Physical exam:GEN: NAD, nontoxic appearingABD: - R hemiabdomen: soft, nontender, nondistended- L hemiabdomen: soft with voluntary guarding, tender but markedly less so than prior, nondistendedPlan:- Continue serial abdominal exams- NGT to LCWS. Flush q4h or more frequently as required- Vitals Isidro DOROTHA Bame, MDGeneral Surgery PGY-210/12/2023

## 2024-02-03 NOTE — ED Notes
 4:34 PM patient moved in room C2 for evaluation of 10/10 abdominal pain and vomiting- self reports strong GI history, patient a/o x4 no respiratory distress, patient + vomiting water - requested for patient to refrain from eating or drinking at this time.515pm Patient with eyes closed- normal chest rise- self reports pain improved since medicine intervention701pm patient to imaging - given additional dose of prn medication for abdominal pain- pending results and disposition from ed provider0725pm report to  pm rn- patient remains at imaging

## 2024-02-03 NOTE — Telephone Encounter
 The patient is calling to speak with Maria [name].  Please advise.Maria [name], Maria [name] [phone]

## 2024-02-03 NOTE — ED Notes
 7:20PM verbal report given from previous nurse, assumed care of pt at this time. Pt presented with acute abdominal pain and vomiting. Pt has hx of colitis and SBO. Full work up in progress, [facility] scan shows sbo, surgery consulted and updated POC. A&Ox4, ambulating independently OOB, Past Medical History[1] 8:02 PM General surgery @bedside  8:09 administered dilaudid  for 9/10 pain8:35 assisted surgery @ bedside with NG tube insertion to R-nostril, connected to low continuous suction, immediate residual present in suction canister after insertion, dark green drainage.  Lactic drawn and sent.9:39 PM LR bolus infusingNurse-Provider [facility] AdmissionsPrimary ED Provider: MD IscoeIs the Patient and Family Aware of Status: [x]  YesPlan of Care Discussed: [x]  Yes	Plan of Care Goals:   pain management and admission to surguryConcern for Sepsis?     []  Yes	[x]  NoIf Yes, Interventions Initiated to Address Sepsis: []  Blood Cultures and Initial Lactate[x]  Fluids[]  Antibiotics[x]  Repeat Lactate   Floor Handoff Telemetry: 	[]  Yes		[x]  NoCode Status:   [x]  Full		[]  DNR		[]  DNI		Other (specify):Safety Precautions: []  Fall Risk  []  Sitter   []  Restraints	[]  Suicidal	[x]  None	Other (specify):Mentation/Orientation:	 A&O (Self, person, place, time) x   4       	 Disoriented to:                    	 Special Accommodations: []  Hearing impaired   []  Blind  []  Nonverbal  []  Cognitive impairmentOxygenation Upon Admission: [x]  RA	[]  NC	[]  Venti  []  Simple Mask []  Other	Baseline O2 Status? []  Yes	[]  NoAmbulation: [x]  Independent	[]  Cane   []  Walker	[]  Wheelchair	[]  Bedbound		[]  Hemiplegic	[]  Paraplegic	[]  QuadraplegicEliminiation: [x]  Independent	[]  Commode	[]  Bedpan/Urinal  []  Straight Cath []  Foley cath			[]  Urostomy	[]  Colostomy	Other (specify):Diarrhea/Loose stool : []  1x within 24h  []  2x within 24h  []  3x within 24h  []  None C.Diff Order: 	[]  Ordered- needs to be collected             []  Collected-sent to lab             []  Resulted - Negative C.Diff             []  Resulted - Positive C.Diff[]  Not Ordered   [x]  N/ASkin Alteration: []  Pressure Injury []  Wound []  None [x]  Skin not assessedDiet: []  Regular/No order placed	[x]  NPO		Other (specify):IV Access: [x]  PIV   []  PICC    []  Port    [] Central line    []  A-line    Other (specify)IVF/GTT Running Upon ED Departure? []  No	    [x]  Yes (specify): LROutstanding Meds/Treatments/Tests:Meds Not Given: (as of time of note) 9:43 PM  Current Facility-Administered Medications Medication  [START ON 02/04/2024] acetaminophen   [START ON 02/04/2024]  enoxaparin  prophylaxis dosing  [START ON 02/04/2024] famotidine  (PEPCID ) IV (All ages)  lactated ringers  (new bag)  sodium chloride  		                                                  lactated ringers  100 mL/hr (02/03/24 2141) 		Valuables Noted:  []  Dentures   []  Glasses  []  Hearing Aids []  None   Jackolyn Belling, RN	  [1] Past Medical History:Diagnosis Date  Acute ischemic colitis (HC Code) 2015  Depression   Diabetes mellitus (HC CODE)   Diabetes mellitus, type II (HC CODE)   Hypercholesteremia   Hypertension   Lupus anticoagulant disorder (HC Code)   Rosacea   Ventral hernia with bowel obstruction   Vertigo

## 2024-02-03 NOTE — Progress Notes
 Patient verbalized understanding that she is to hold warfarin for 5 days starting on Sunday 10/12 for colonoscopy on Friday 10/17. Adter the procedure if approved by her gastroenterologist patient will restart warfarin 7.5 mg daily with INR retest on 10/21. Dr. Gladis does not require bridging with Lovenox  for the procedure.Maria [name], RPHClinical Pharmacist, Anticoagulation ClinicSmilow [facility] and [facility]

## 2024-02-03 NOTE — Telephone Encounter
 Returned patients call to clarify that her last dose of warfarin before colonoscopy will be 10/11.Maria [name], RPHClinical Pharmacist, Anticoagulation ClinicSmilow [facility] and [facility]

## 2024-02-04 ENCOUNTER — Inpatient Hospital Stay: Admit: 2024-02-04 | Payer: Medicare (Managed Care)

## 2024-02-04 ENCOUNTER — Encounter: Admit: 2024-02-04 | Payer: PRIVATE HEALTH INSURANCE | Primary: Internal Medicine

## 2024-02-04 ENCOUNTER — Telehealth: Admit: 2024-02-04 | Payer: PRIVATE HEALTH INSURANCE | Attending: Gastroenterology | Primary: Internal Medicine

## 2024-02-04 DIAGNOSIS — Z5181 Encounter for therapeutic drug level monitoring: Secondary | ICD-10-CM

## 2024-02-04 DIAGNOSIS — Z86718 Personal history of other venous thrombosis and embolism: Secondary | ICD-10-CM

## 2024-02-04 DIAGNOSIS — K56609 Unspecified intestinal obstruction, unspecified as to partial versus complete obstruction: Principal | ICD-10-CM

## 2024-02-04 DIAGNOSIS — D6862 Lupus anticoagulant syndrome: Principal | ICD-10-CM

## 2024-02-04 LAB — CBC WITH AUTO DIFFERENTIAL
BKR WAM ABSOLUTE IMMATURE GRANULOCYTES.: 0.05 x 1000/ÂµL (ref 0.00–0.30)
BKR WAM ABSOLUTE LYMPHOCYTE COUNT.: 0.69 x 1000/ÂµL (ref 0.60–3.70)
BKR WAM ABSOLUTE NRBC: 0 x 1000/ÂµL (ref 0.00–1.00)
BKR WAM ANC (ABSOLUTE NEUTROPHIL COUNT): 16.74 x 1000/ÂµL — ABNORMAL HIGH (ref 2.00–7.60)
BKR WAM BASOPHIL ABSOLUTE COUNT.: 0.06 x 1000/ÂµL (ref 0.00–1.00)
BKR WAM BASOPHILS: 0.3 % (ref 0.0–1.4)
BKR WAM EOSINOPHIL ABSOLUTE COUNT.: 0.02 x 1000/ÂµL (ref 0.00–1.00)
BKR WAM EOSINOPHILS: 0.1 % (ref 0.0–5.0)
BKR WAM HEMATOCRIT: 43.1 % (ref 35.00–45.00)
BKR WAM HEMOGLOBIN: 13.1 g/dL (ref 11.7–15.5)
BKR WAM IMMATURE GRANULOCYTES: 0.3 % (ref 0.0–1.0)
BKR WAM LYMPHOCYTES: 3.8 % — ABNORMAL LOW (ref 17.0–50.0)
BKR WAM MCH: 25.1 pg — ABNORMAL LOW (ref 27.0–33.0)
BKR WAM MCHC: 30.4 g/dL — ABNORMAL LOW (ref 31.0–36.0)
BKR WAM MCV: 82.6 fL (ref 80.0–100.0)
BKR WAM MONOCYTE ABSOLUTE COUNT.: 0.67 x 1000/ÂµL (ref 0.00–1.00)
BKR WAM MONOCYTES: 3.7 % — ABNORMAL LOW (ref 4.0–12.0)
BKR WAM MPV: 10.3 fL (ref 8.0–12.0)
BKR WAM NEUTROPHILS: 91.8 % — ABNORMAL HIGH (ref 39.0–72.0)
BKR WAM NUCLEATED RED BLOOD CELLS: 0 % (ref 0.0–1.0)
BKR WAM PLATELETS: 356 x1000/ÂµL (ref 150–420)
BKR WAM RDW-CV: 17 % — ABNORMAL HIGH (ref 11.0–15.0)
BKR WAM RED BLOOD CELL COUNT.: 5.22 M/ÂµL (ref 4.00–6.00)
BKR WAM WHITE BLOOD CELL COUNT: 18.2 x1000/ÂµL — ABNORMAL HIGH (ref 4.0–11.0)

## 2024-02-04 LAB — BASIC METABOLIC PANEL
BKR ANION GAP: 13 (ref 7–17)
BKR BLOOD UREA NITROGEN: 13 mg/dL (ref 8–23)
BKR BUN / CREAT RATIO: 18.6 (ref 8.0–23.0)
BKR CALCIUM: 8.6 mg/dL — ABNORMAL LOW (ref 8.8–10.2)
BKR CHLORIDE: 98 mmol/L (ref 98–107)
BKR CO2: 23 mmol/L (ref 20–30)
BKR CREATININE DELTA: -0.3
BKR CREATININE: 0.7 mg/dL (ref 0.40–1.30)
BKR EGFR, CREATININE (CKD-EPI 2021): >60 mL/min/1.73m2 (ref >=60)
BKR GLUCOSE: 178 mg/dL — ABNORMAL HIGH (ref 70–100)
BKR POTASSIUM: 4.3 mmol/L (ref 3.3–5.3)
BKR SODIUM: 134 mmol/L — ABNORMAL LOW (ref 136–144)

## 2024-02-04 LAB — PARTIAL THROMBOPLASTIN TIME     (BH GH LMW Q YH)
BKR PARTIAL THROMBOPLASTIN TIME: 37.3 s — ABNORMAL HIGH (ref 23.0–31.0)
BKR PARTIAL THROMBOPLASTIN TIME: 59.7 s — ABNORMAL HIGH (ref 23.0–31.0)

## 2024-02-04 LAB — PHOSPHORUS     (BH GH L LMW YH): BKR PHOSPHORUS: 4 mg/dL (ref 2.2–4.5)

## 2024-02-04 LAB — PROTIME AND INR
BKR INR: 1.39 — ABNORMAL HIGH (ref 0.90–1.10)
BKR PROTHROMBIN TIME: 14.4 s — ABNORMAL HIGH (ref 9.0–12.0)

## 2024-02-04 LAB — MAGNESIUM: BKR MAGNESIUM: 1.9 mg/dL (ref 1.7–2.4)

## 2024-02-04 MED ORDER — INSULIN U-100 REGULAR HUMAN 100 UNIT/ML (CORRECTION SCALE)
100 | Freq: Four times a day (QID) | SUBCUTANEOUS | Status: DC
Start: 2024-02-04 — End: 2024-02-05

## 2024-02-04 MED ORDER — PHENOL 1.4 % MUCOSAL AEROSOL SPRAY
1.4 | OROMUCOSAL | Status: DC | PRN
Start: 2024-02-04 — End: 2024-02-06

## 2024-02-04 MED ORDER — HEPARIN 25,000 UNIT/250 ML D5W (HIGH RISK OF BLEEDING-ALL SITES)
25000 | INTRAVENOUS | Status: DC
Start: 2024-02-04 — End: 2024-02-06
  Administered 2024-02-04 – 2024-02-05 (×2): 25000 mL/h via INTRAVENOUS

## 2024-02-04 MED ORDER — INSULIN U-100 REGULAR HUMAN 100 UNIT/ML (CORRECTION SCALE)
100 | Freq: Four times a day (QID) | SUBCUTANEOUS | Status: DC
Start: 2024-02-04 — End: 2024-02-04

## 2024-02-04 MED ORDER — IOHEXOL 350 MG IODINE/ML ORAL
350 | Freq: Once | NASOGASTRIC | Status: CP
Start: 2024-02-04 — End: ?
  Administered 2024-02-04: 09:00:00 350 mL via NASOGASTRIC

## 2024-02-04 MED ORDER — DIPHENHYDRAMINE 50 MG/ML INJECTION (WRAPPED E-RX)
50 | INTRAVENOUS | Status: DC | PRN
Start: 2024-02-04 — End: 2024-02-06
  Administered 2024-02-04 (×2): 50 mL via INTRAVENOUS

## 2024-02-04 MED ORDER — DEXTROSE 5% IV BOLUS NEW BAG (DROPS CHARGE)
5 | Freq: Once | INTRAVENOUS | Status: DC
Start: 2024-02-04 — End: 2024-02-06

## 2024-02-04 MED ORDER — DEXTROSE 10 % IN WATER (D10W) INTRAVENOUS SOLUTION
10 | INTRAVENOUS | Status: DC
Start: 2024-02-04 — End: 2024-02-04

## 2024-02-04 MED ORDER — WARFARIN 5 MG TABLET
5 | Freq: Every day | ORAL | 1.00 refills | 61.00000 days | Status: AC
Start: 2024-02-04 — End: ?

## 2024-02-04 MED ORDER — SODIUM CHLORIDE 0.45 % IV BOLUS NEW BAG (DROPS CHARGE)
0.45 | Freq: Once | INTRAVENOUS | Status: CP
Start: 2024-02-04 — End: ?
  Administered 2024-02-04: 10:00:00 0.45 mL/h via INTRAVENOUS

## 2024-02-04 NOTE — Progress Notes
 SURGERY PROGRESS NOTETrauma & Emergency General SurgeryAttending Provider: Lenon Flatten B, *Interim History: -NAEO-NGT 1000-Serial abdominal exams q4h WNL with progressive improvement-WBC 18.2 (17.9)-lactate 1.3Review of Allergies/Meds/Hx: I have reviewed the patient's: allergies, current medicationsData: Vitals:Temp:  [97.1 ?F (36.2 ?C)-98.5 ?F (36.9 ?C)] 98.5 ?F (36.9 ?C)Pulse:  [69-108] 107Resp:  [16-22] 16BP: (138-175)/(62-89) 138/62SpO2:  [95 %-99 %] 99 %Device (Oxygen Therapy): room air I/O's:I/O last 3 completed shifts:In: 590 [I.V.:500; Other:90]Out: 1000 [Other:1000]Exam: GEN: uncomfortable, but nontoxicCV: RRRESP: NL WOB ON RAABD: - R abdomen: soft, nontender, nondistended. When palpated, has pain in L abdomen- L abdomen: guarding, very tender to palpation, nondistended- No rebound, no pain after shaking the bed- Small inferior incisional herniaEXT: WWPObjective: Labs:CBCLab Results Component Value Date  WBC 18.2 (H) 02/04/2024  HGB 13.1 02/04/2024  HCT 43.10 02/04/2024  PLT 356 02/04/2024 LYTESLab Results Component Value Date  NA 134 (L) 02/04/2024  K 4.3 02/04/2024  CL 98 02/04/2024  CO2 23 02/04/2024  CREATININE 0.70 02/04/2024  BUN 13 02/04/2024  GLU 146 (H) 02/04/2024  CALCIUM  8.6 (L) 02/04/2024  MG 1.9 02/04/2024  PHOS 4.0 02/04/2024 LFTsLab Results Component Value Date  BILITOT 0.6 02/03/2024  BILIDIR  02/03/2024    Comment:    Sample Hemolyzed.   AST 28 02/03/2024  ALT 19 02/03/2024  ALKPHOS 120 02/03/2024  AMYLASE 41 08/22/2014  LIPASE 27 02/03/2024 PT/INR/PTTLab Results Component Value Date  INR 1.69 (H) 02/03/2024  PTT 60.4 (H) 01/01/2023 Assessment: Patient is a 73 y.o. 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 Presenting 10/9 with high grade SBO. Continue serial ab exams.Plan: - Contrast study- Pain control- Bowel regimen- Diet: NPO- Monitor UOP and I/Os- Home meds have been reviewed- DVT prophylaxis: lovenox  40mg - IS hourly while awake and encourage ambulation Signed:Samantha Jozi Malachi, DDSOral & Maxillofacial Surgery, PGY-2For questions: EPIC Secure ChatAttending Addendum:

## 2024-02-04 NOTE — Plan of Care
 Plan of Care Overview/ Patient StatusAdmission Note Nursing Hareem Surowiec is a 73 y.o. female admitted with a chief complaint of small bowel obstruction. [facility] EDPatient is   alert & oriented x4 Vitals:  02/03/24 2330 02/03/24 2358 02/04/24 0028 02/04/24 0117 BP: 138/89 (!) 175/88  (!) 169/79 Pulse: 69 (!) 104  (!) 99 Resp: 18 18  16  Temp: 97.5 ?F (36.4 ?C) 97.5 ?F (36.4 ?C)  98.1 ?F (36.7 ?C) TempSrc: Oral   Oral SpO2: 95% 96%  96% Weight:   74.8 kg  Height:   4' 10 (1.473 m)  Oxygen therapy Oxygen TherapySpO2: 96 %Device (Oxygen Therapy): room airI have reviewed the patient's current medication orders..I have reviewed patient valuables Belongings charted in last 7 days: Patient Valuables   Patient Valuables Flowsheet                    PATIENT VALUABLE(S)       Purse Disposition At bedside/locker/closet 02/04/24 0041  Clothing Disposition At bedside/locker/closet 02/04/24 0041   Other disposition At bedside/locker/closet 02/04/24 0041     Comments:[place] is A&Ox4. VSS on RA. No c/o SOB, CP, calf tenderness, N&V. IS use encouraged Q1hr while awake. Diet: NPO, q6h blood sugars, CGM to LUE. IVF running according to [facility]. Voiding spontaneously up to toilet, SBA w/o device. LBM: 10/9. Skin: healed incisions to abdomen, otherwise skin is CDI.  NGT to LCWS, taped to center of nostrils. Neuros: +CMS/+radial/+PP, c/o numbness/tingling @ baseline to hands. Strength 5/5/ throughout. +dorsi/plantar flexion.  Pain managed with dilaudid  IVP. DVT prophylaxis: SCDs, Lovenox .  Safety maintained, call bell within reach, see flowsheets for details, WCTM. BP (!) 169/79  - Pulse (!) 99  - Temp 98.1 ?F (36.7 ?C) (Oral)  - Resp 16  - Ht 4' 10 (1.473 m)  - Wt 74.8 kg  - SpO2 96%  - BMI 34.49 kg/m? Pamula Perna, RN

## 2024-02-04 NOTE — Other
 PHARMACY-ASSISTED MEDICATION REPORTPharmacist review of the best possible medication history obtained by the pharmacy medication history technician has been performed.  I have updated the home medication list and identified the following information that may be relevant to this admission.NOTES/RECOMMENDATIONSResume gabapentin , glargine, losartan , pravastatin , venlafaxine , sitagliptin as appropriate.Heparin  gtt ordered in place of warfarin.       Prior to Admission Medications Medication Name Sig Taking? Patient Reported   acetaminophen  (TYLENOL ) 500 mg tabletLast dose:  --  Take 1 tablet (500 mg total) by mouth every 4 (four) hours as needed. Yes     biotin 1 mg tabletLast dose:  -- Last Medication Note: >> SERRANO DIAZ, AMARYLIS   Dju Jul 16, 2018  6:52 PM Entered by Coni Coe, Amarylis, CPHT Sat Jul 16, 2018 8147 Take 1 tablet (1,000 mcg total) by mouth daily. 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 Accu-chek       blood-glucose sensor (FREESTYLE LIBRE 3 PLUS SENSOR) deviceLast dose:  --  Apply a new sensor every 15 days E11.9 on insulin        cholecalciferol, vitamin D3, 125 mcg (5,000 unit) tabletLast dose:  -- Last Medication Note: >> JOHNSTON, JACKIE   Sat Nov 21, 2016 10:17 AM Entered by Ethel American, PharmD Sat Nov 21, 2016 1017 Take 1 tablet (5,000 Units total) by mouth every other day. Yes Yes   diphenhydramine  HCl (BENADRYL  ALLERGY  ORAL)Last dose:  -- Last Medication Note: >> CON ROSINA [place] Mar 05, 2021  1:51 PM Entered by CON ROSINA, CPHT Wed Mar 05, 2021 1351 Take 1 tablet by mouth as needed. Yes Yes   Discontinued: 02/04/2024  1:41 PMLast dose: Not TakingLast Medication Note: >> CLODIA-JACINTHE, MARIA   Fri Feb 04, 2024 12:12 PMMedHX Tech(Maria Clodia-Jacinthe, CPHT): FLAG FOR REMOVAL -  Prescription was already completed as directed by patient's providerEntered by Cleatis Ip, CPHT Fri Feb 04, 2024 1212       FREESTYLE LIBRE 3 READER MiscLast dose:  --  Use as directed.       gabapentin  (NEURONTIN ) 300 mg capsuleLast dose:  --  Take 1 capsule (300 mg total) by mouth nightly. Yes Yes   insulin  glargine (LANTUS ) 100 unit/mL vialLast dose:  --  Inject 10 Units under the skin nightly. Yes Yes   insulin  lispro (HUMALOG  KWIKPEN INSULIN ) 100 unit/mL penLast dose:  -- Last Medication Note: >> CLODIA-JACINTHE, MARIA   Fri Feb 04, 2024 12:17 PMMedHX Tech(Maria Clodia-Jacinthe, CPHT): patient reports 4 units with small meals and 8 units with large meals but reports usually missing doses due to low blood sugar.Entered by Cleatis Ip, CPHT Fri Feb 04, 2024 1217 Inject 2 units with smaller carb meals and 4 units with larger carb meals Yes     JARDIANCE  25 mg tabletLast dose:  --  Take 1 tablet (25 mg total) by mouth daily. 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 Accu-chek       losartan  (COZAAR ) 25 mg tabletLast dose:  --  Take 1 tablet (25 mg total) by mouth daily. Yes Yes   meclizine (ANTIVERT)  12.5 mg tabletLast dose:  -- Last Medication Note: >> CON ROSINA [place] Mar 05, 2021  1:48 PM Entered by CON ROSINA, CPHT Wed Mar 05, 2021 1348 Take 1 tablet (12.5 mg total) by mouth daily as needed for Dizziness Yes     melatonin 10 mg TabLast dose:  -- Last Medication Note: >> CON ROSINA [place] Mar 05, 2021  1:51 PM Entered by CON ROSINA, CPHT Wed Mar 05, 2021 1351 Take 10 mg by mouth nightly as needed.  Yes Yes   multivitamin tabletLast dose:  -- Last Medication Note: >> CON ROSINA [place] Mar 05, 2021  1:51 PM Entered by CON ROSINA, CPHT Wed Mar 05, 2021 1351 Take 1 tablet by mouth daily. Yes Yes   NANO 2ND GEN PEN NEEDLE 32 gauge x 5/32Last dose:  --  USE WITH INSULIN  PEN UP TO 4 TIMES PER DAY       olopatadine  (PATADAY ) 0.2 % ophthalmic solutionLast dose:  --  Place 1 drop into both eyes daily. Yes     ondansetron  (ZOFRAN ) 4 MG tabletLast dose:  -- Last Medication Note: >> CON ROSINA [place] Mar 05, 2021  1:48 PM Entered by CON ROSINA, CPHT Wed Mar 05, 2021 1348 Take 1 tablet (4 mg total) by mouth every 8 (eight) hours as needed for nausea. Yes Yes   phytonadione, vit K1, (VITAMIN K) 100 mcg tabletLast dose:  --  Take 1 tablet (100 mcg total) by mouth daily. Yes Yes   pravastatin  (PRAVACHOL ) 80 mg tabletLast dose:  -- Last Medication Note: >> CONI HAYES FRANCIS [place] Sep 10, 2022  8:01 PM Entered by CONI HAYES, Amarylis, CPHT Thu Sep 10, 2022 2001 Take 1 tablet (80 mg total) by mouth nightly. Yes Yes   Discontinued: 02/04/2024  1:41 PMLast dose: Not TakingLast Medication Note: >> CLODIA-JACINTHE, MARIA   Fri Feb 04, 2024 12:15 PMMedHX Tech(Maria Clodia-Jacinthe, CPHT): FLAG FOR REMOVAL -  patient reports self-discontinued medication due to increased health palpitation after taking - reports MD approved discontinuation Entered by Cleatis Ip, CPHT Fri Feb 04, 2024 1215   Yes   senna (SENOKOT) 8.6 mg tabletLast dose:  -- Last Medication Note: >> CONI HAYES FRANCIS [place] Sep 10, 2022  8:03 PM Entered by CONI HAYES FRANCIS, CPHT Thu Sep 10, 2022 2003 Take 2 tablets by mouth nightly.. Yes     SITagliptin phosphate (JANUVIA) 100 mg tabletLast dose:  --  Take 1 tablet (100 mg total) by mouth daily. Yes     sodium,potassium,mag sulfates (SUPREP) 17.5-3.13-1.6 gram bowel prep kitLast dose:  --  Take by mouth See Admin Instructions. Dilute with water  to volume of 16 oz. beforehand;drink 1 bottle evening before & morning of procedure.       venlafaxine  (EFFEXOR  XR) 150 mg XR 24 hr extended release capsuleLast dose:  -- Last Medication Note: >> FINNEMORE, CAITLIN   Tue Apr 09, 2020  6:19 PM Entered by Karle Mora, CPHT Tue Apr 09, 2020 1819 Take 1 capsule (150 mg total) by mouth daily. Yes Yes   Discontinued: 02/04/2024  1:41 PMLast dose: Not TakingLast Medication Note: >> CLODIA-JACINTHE, MARIA   Fri Feb 04, 2024 12:16 PMMedHX Tech(Maria Clodia-Jacinthe, CPHT): FLAG FOR REMOVAL -  patient's LIP had changed the dose/direction of the medication.  Please remove original order using the Delete/Clean Up and utilize a new entry  [confirmed by: patient and anti-coag visit ]Entered by Cleatis Ip, CPHT Fri Feb 04, 2024  1216       warfarin (COUMADIN ) 5 mg tabletLast dose:  --  Take 1.5 tablets (7.5 mg total) by mouth Daily @1800 . Yes Yes   Prior to admission medications last reviewed by Cleatis Ip, CPHT on Fri Feb 04, 2024 1217 Thank you,June Rode, PharmD10/10/20251:42 PMPhone/Epic Secure Chat

## 2024-02-04 NOTE — Progress Notes
 Serial abdominal exam note- NGT withdrawn 10 cmAppearance- Nondistended- No hernias appreciated- No erythema or skin changes in any quadrant Palpation- Soft- moderate tenderness to palpation in LUQ and mild tenderness to palpation in LLQ- No rebound- No guarding, voluntary or involuntary- Non-peritonitic- No masses palpated Percussion- Dull in all Torrie Apley, MDPGY1

## 2024-02-04 NOTE — Plan of Care
 [facility]-SrcSpiritual Care NoteAssessment:  Religion:CatholicI visited Maria [name] per her request. When I arrived she was resting. She requested a visit later today. I offered prayer and she accepted. Maria [name] expressed appreciation for the visit.Intervention:  Referral Source: PatientResponding Chaplain: Chaplain InternVisit and Intervention Type: Consult Spiritual care interventions provided: Cultural, Religious or Spiritual Resources Interventions: : PrayerRelational or Interpersonal Interventions: : Spiritual Support/Presence Outcome: With the help of the chaplain, patient/loved one(s): Relational or Interpersonal Outcomes:: Felt More at [employer], Established a Relationship with the Chaplain Plan: Follow-Up Visit Needed: [facility] chaplain please follow up on Friday.Will HarperChaplain InternOn-Call ChaplainYSC [phone] Chaplains are available 24/7 for emotional, spiritual, religious, and existential support for all patients, their loved ones, and staff - for those who are religious and those who are not.When to Call a Chaplain:N: New diagnosis E: Emotional / spiritual distress E: Existential distress D: Decision making / goals of care meetingsS: Support for staff and patients' loved ones   C: Compromised copingA: Anxiety / stress / grief / loneliness R: Religious / cultural / ritual needsE: End-of-life care / death/dying2025/10/24 6:02 AM

## 2024-02-04 NOTE — Progress Notes
 Serial abdominal exam noteAppearance- Nondistended- No hernias appreciated- No erythema or skin changes in any quadrant Palpation- Soft- mild tenderness to palpation in LUQ and mild tenderness to palpation in LLQ- No rebound- No guarding, voluntary or involuntary- Non-peritonitic- No masses palpated Percussion- Dull in all Maria [name], MDPGY1

## 2024-02-04 NOTE — Plan of Care
 TRANSFER OUT NOTEASSESSMENTNeuro: Cognitive/Neuro/Behavioral WDL: WDLCardiac: Cardiac WDL: WDLTelemetry: 	[]  Yes		[x]  NoRespiratory:Respiratory WDL: WDLGastrointestinal: GI WDL: .WDL except;GI symptomsGenitourinary: Genitourinary WDL: WDLElimination:   [x]  Independent	[]  Commode	[]  Bedpan/Urinal  []  Straight Cath		   []  Foley cath 	[]   Urostomy	[]  Colostomy	Other (specify):Skin Alteration: []  Pressure Injury []  Wound [x]  None Ambulation:     [x]  Independent []  Cane   []  Walker	[]  Wheelchair       	               []  Bedbound     []  Hemiplegic []  Paraplegic []  Quadraplegic Dietary Orders (From admission, onward)     Start     Ordered  02/03/24 2058  Diet NPO  DIET EFFECTIVE NOW      Question:  NPO:  Answer:  Nothing enteral or oral  02/03/24 2100     Medication Administration Special Directions:  via ngtIV Access: [x]  PIV   []  PICC    []  Port    [] Central line    []  A-line    Other (specify)Outstanding Meds/Treatments/Tests: XRAYSafety Precautions: [x]  None	[]  Sitter   []  Restraints	[]  Suicidal                      	  []  Fall Risk	 Other (specify):TRANSFER INFORMATION Patient Belongings:I have reviewed patient valuables Belongings charted in last 7 days:Patient Valuables   Patient Valuables Flowsheet                    PATIENT VALUABLE(S)       Purse Disposition At bedside/locker/closet 02/04/24 0041  Clothing Disposition At bedside/locker/closet 02/04/24 0041   Other disposition At bedside/locker/closet 02/04/24 0041     Is someone taking belongings home?   []  No     []  Yes  Who? (specify) _________Transferred to V3S (unit) for appropriate .Equipment/Device: noneEscorted ab:Umjwdenmu staffVitals:  02/04/24 0533 02/04/24 0537 02/04/24 0926 02/04/24 1210 BP: 138/62 138/62 (!) 169/64 (!) 148/64 Pulse: (!) 107 (!) 107 (!) 117 (!) 97 Resp: 16 16 18 16  Temp: 98.5 ?F (36.9 ?C) 98.5 ?F (36.9 ?C) 98.6 ?F (37 ?C) 97.8 ?F (36.6 ?C) TempSrc: Oral  Oral Oral SpO2: 99% 99% 94% 97% Weight:     Height:      Verbal report given to [employer]  (name) Report considered of patient's Situation, Background, Assessment, and RecommendationOpportunity for questions and clarification was provided

## 2024-02-04 NOTE — Telephone Encounter
 Called pt and let her know that due to hospitalization for SBO we will have to cancel her colonoscopy and reevaluate once she has been discharged. She agrees with plan. She is not sure when she will be discharged therefore she will call us  to schedule with Rosina once she is home.

## 2024-02-04 NOTE — Plan of Care
 Plan of Care Overview/ Patient StatusReason for Social Work Consult: Suspicion of Abuse/Neglect thoughts of suicide Suspicion of Abuse / Neglect: Physical Injury  Patient expressed to me thoughts of suicide in the recent past and feels down/depressed most days. She stated that she has previous tried harming herself 20+ years ago. She is upset over her diagnoses, and repeatedly being in the hospital, which has caused her to feel this way. She stated to me she wants to speak with someone in the hospital about her feelings, but also help with connecting with someone outside of the hospital. SW met with pt at bedside and introduced herself and her role. Pt expressed understanding. Pt reported that she lives with her husband and her cat. Pt reports that she has a very close and supportive family. Pt reports that she had a prior SI attempt 40+ years ago were she went to the store; bought a beer; and attempted to OD on her antidepressants; pt reports that she wrote a SI note prior to the attempt. Pt reports that her children found her and put her in the shower until she came to. Pt reports that there have been no other attempts. Pt reports that she regrets her actions looking back on it now. Pt reported her reasons for living include her family, religious consequences, and her plans for the future which includes moving to [facility] in March of 2026. Pt reports that she enjoys gardening, reading, and sewing and that she tries to keep herself busy as possible.Pt reports that she was been diagnosed with depression long ago and that she has been on Effexor  100mg  for 20+ years but that she feels that it doesn't work. Pt reports that she has also been diagnosed with anxiety and PTSD. SW asked pt if she could put in additional referral for outpatient therapeutic services which pt agreed to. Referral sent to [facility] for pt to follow up at d/c. SW spent 45 minutes with pt at bedside and has included contact information for [facility] for pt to f/u at d/c. Thank you for the consult. Bernarda Sharps, LMSWCase Management Department, [facility]

## 2024-02-04 NOTE — Progress Notes
 [facility]-SrcSpiritual Care NoteAssessment:  Religion:CatholicI followed up with Maria [name], to give her the Randi Keeler Diario devotional. I met her husband, Natividad, who was with Maria [name] at her bedside. Maria [name] asked for assistance requesting a throat numbing medication, because she is in pain. I asked her RN for assistance with this. Maria [name] thanked me for the visit, and I offered education about how to request a follow-up visit from the Spiritual Care Department if needed. Intervention:  Referral Source: (P) Chaplain InitiatedResponding Chaplain: (P) Chaplain Intern, Unit ChaplainConsulted With: NurseVisit and Intervention Type: (P) Follow-up Spiritual care interventions provided: Cultural, Religious or Spiritual Resources Interventions: : (P) Religious/Spiritual MaterialsRelational or Interpersonal Interventions: : (P) Active Listening and or Reflection, Empathy, Support for Family/Loved One, Companionship, Spiritual Support/Presence, HospitalityMeaning-Making Interventions: : Life Review and/or Story Listening, Re-framing, Facilitation of Spiritual Reflection/Meaning-Making Outcome: With the help of the chaplain, patient/loved one(s): Cultural, Religious or Spiritual Resources Outcomes: : (P) Felt More Respected Culturally or Personally, Used Proofreader for [employer] or to [employer], Received Desired Rituals and/or PracticesRelational or Interpersonal Outcomes:: (P) Established a Relationship with the Chaplain, Felt Greater Comfort and/or Support, Felt More at [employer], Felt a Greater Sense of Belonging, Identified Relational ResourcesMeaning-Making Outcomes:: Made Progress Toward Spiritual/Emotional Growth, Clarified Their Beliefs/Life Story Plan:  No further spiritual care interventions needed at this time. I provided education surrounding the availability of spiritual care moving forward and how she can request a follow-up visit if desired. Kiyana Vazguez-LeeChaplain InternOn-Call ChaplainYSC [phone] Chaplains are available 24/7 for emotional, spiritual, religious, and existential support for all patients, their loved ones, and staff - for those who are religious and those who are not.When to Call a Chaplain:N: New diagnosis E: Emotional / spiritual distress E: Existential distress D: Decision making / goals of care meetingsS: Support for staff and patients' loved ones   C: Compromised copingA: Anxiety / stress / grief / loneliness R: Religious / cultural / ritual needsE: End-of-life care / death/dying2025/11/04 4:05 PM

## 2024-02-04 NOTE — Plan of Care
 Plan of Care Overview/ Patient StatusPatient is A+Ox4. Hypertensive & tachycardic otherwise VSS on RA. No complaints of chest pain, dyspnea, or calf tenderness. N/T to bilateral hands & baseline. +CMS, +PP. +Radial Pulses. IS encouraged 10x per hour while awake. Healed incisions to abdomen OTA. CGM to LUE CDI. 18 F NGT to R nostril taped. Inserted to 70cm. T&P Q2 hours. OOB independently. Pt NPO, Fluids per MAR. LBM 10/9. Voids spon in BR. Pain controlled with tylenol  & IVP Dilaudid . LVX & SCDs for DVT Prophylaxis. Call light in reach. Safety maintained. See flowsheets for further detail. Adden 9084: Oral contrast given via NGT. Xray called & informed.Adden 1000: 1L NS bolus ordered. Hep gtt ordered d/t increased INR. Care ongoing.Adden 1126: Hep gtt started. PTT due at 1720. Adden 1600: Pt being transferred to V3S. Report given to [facility]. RN called XRAY to confirm timing of test & they stated that it will done around 1715.

## 2024-02-04 NOTE — Plan of Care
 Plan of Care Overview/ Patient StatusProblem: Adult Inpatient Plan of CareGoal: Readiness for Transition of CareOutcome: Interventions implemented as appropriate Case Management Screening  Flowsheet Row Most Recent Value Case Management Screening: Chart review completed. If YES to any question below then proceed to CM Eval/Plan  Is there a change in their cognitive function No Do you anticipate that the patient will have any discharge needs requiring CM/SW intervention? Yes Has there been an unscheduled readmission within the past 30 days and/or >four (4) admissions within 12 months? No Were there services prior to admission ( Examples: Acute [facility],  Assisted Living, HD, Homecare, Extended Care Facility, Methadone, SNF, Outpatient Infusion Center) No Negative/Positive Screen Positive Screening: Complete CM/SW Evaluation and Plan Case Manager/Social Work Attestation  I have reviewed the medical record and completed the above screen. CM/SW staff will follow patient's progress and discuss the plan of care with the Treatment Team. Yes Case Management/Social Work Evaluation  Flowsheet Row Most Recent Value Case Management/Social Work Evaluation and Plan  Arrived from prior to admission home/apartment/condo Lives with Pets, Spouse Services Prior to Admission None Patient Requires Transition of Care Intervention Due To Change in physical function Prior to Hospitalization: Assistance Needed/DME being used None Documented Insurance Accurate Yes Any financial concerns related to anticipated discharge needs No Patient's home address verified Yes Patient's PCP of record verified Yes Last Date Seen by PCP 0-3 months Source of Clinical History  Patient's clinical history has been reviewed and source of Information is: Medical Record, Patient Discharge Planning Coordination Recommendations  Discharge Planning Coordination Recommendations Needs not determined at this time Case Manager/Social Worker reviewed plan of care/ continuum of care need's with  Patient, Discharge Planning Rounds Housing / Transportation/ Environment  What is your living situation today? I have a steady place to live Think about the place you live. Do you have problems with any of the following? None In the past 12 months has the electric, gas, oil, or water  company threatened to shut off services in your home? No Within the past 12 months, you worried that your food would run out before you got the money to buy more. Never true Within the past 12 months, the food you bought just didn't last and you didn't have money to get more. Never true In the past 12 months, has lack of transportation kept you from medical appointments or from getting medications? no In the past 12 months, has lack of transportation kept you from meetings, work, or from getting things needed for daily living? No  Abuse Screen  Is there anyone in your life that is hurting or threatening you in anyway? no Read to patient We know that many of our patients and caregivers are exposed to violence in the home so we have started letting everyone know that [place]  has a safe, confidential and free 24/7 hotline called [place] [facility] Connect yes education provided Would it be ok if we add this resource to your DC paperwork? no Abuse Screen (yes response referral indicated)  Able to respond to abuse questions Yes Is there anyone in your life that is hurting or threatening you in anyway? no Physical Indicators of Abuse No evidence of physical abuse Read to patient We know that many of our patients and caregivers are exposed to violence in the home so we have started letting everyone know that [place]  has a safe, confidential and free 24/7 hotline called [place] [facility] Connect yes education provided Would it be ok if we add this resource  to your DC paperwork? no 73 y.o. 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 Presenting 10/9 with high grade SBO. Met with pt and discussed role of CM and dc needs. Pt lives with spouse and her cat in a 3 level home with a few STE.  Is independent at baseline.   Uses no dme and drives.    Sates she can stay on first floor.    Pt currently not medically ready for dc and do not anticipate a w/e dc.   CM dept to follow for dc needs.  Pt has no pref for srvs if needed.  Husband to transport home.Suzen KYM Barns     RN BSN ACM-RNCase Management Department [facility]/SRC

## 2024-02-04 NOTE — ED Provider Notes
 Chief Complaint Patient presents with  Abdominal Pain   11am abdominal pain all around upper abdomen, patient moaning in triage, hx of same pain due to a bowel obstruction, +nausea, no vomiting, no diarrhea  HPI/PE:73 yo woman with history of DM, ischemic colitis s/p bowel resection, colostomy with reversal, SBOs, presents with abdominal pain. She felt well until 11am, when she developed diffuse abdominal pain, bloating, nausea. This feels like prior SBOs. No diarrhea pr fever. + flatus earlier today. Exam. Uncomfortable appearing in C1 hall. No respiratory distress. Abdomen diffusely tender with guarding, soft. DDx. SBO. Pancreatitis. Viral syndrome. Analgesia, IVF, labs, [place]. Care transferred to Dr. Refugia pending results and dispo. Toribio Coho acute or life threatening problem was considered during this evaluation  A decision regarding hospitalization was made during this visit    Physical ExamED Triage Vitals [02/03/24 1535]BP: (!) 147/82Pulse: 76Pulse from  O2 sat: n/aResp: (!) 22Temp: 97.1 ?F (36.2 ?C)Temp src: TemporalSpO2: 99 % BP 138/62  - Pulse (!) 107  - Temp 98.5 ?F (36.9 ?C)  - Resp 16  - Ht 4' 10 (1.473 m)  - Wt 74.8 kg  - SpO2 99%  - BMI 34.49 kg/m? Physical Exam ProceduresAttestation/Critical CareComments as of 02/04/24 0710 Thu Feb 03, 2024 1609 __________________________Patient signed out to me by Dr. Colene, PMH including SLE, anticoagulant syndrome, prior ischemic bowel s/p resection, here w/ abd pain, emesis, still passing flatus, pending CTAP, labs, r/o SBO. Oneil Refugia, MD, MHSEmergency Medicine Attending [MI] [phone] Received message from radiology, [place], which I reviewed, shows recurrent SBO.  Patient is well-appearing, awake, alert, no longer with nausea.  Still with some pain, but feels in his controlled with medications.  Surgery paged, awaiting callback. [MI] 1952 Discussed with surgery resident; he will come evaluate [MI] 2048 Seen by surgery consult; admitted to floor under Dr. Dane [MI]  Comments User Index[MI] Refugia Oneil, MD   Clinical Impressions as of 02/04/24 0710 SBO (small bowel obstruction) (HC Code)  [facility] CODE)  ED DispositionAdmit  Refugia Oneil, MD10/09/25 2049 Colene Toribio, MD10/10/25 0710

## 2024-02-04 NOTE — Consults
 [place] [facility]	 [facility]	Emergency General Surgery Consult NotePatient Data:  Patient Name: Maria [name] Age: 73 y.o. DOB: 28-May-1950	 MRN: [id number]	  Consult Information: Consultation requested by: Lawerence for consultation: SBOHPI: [name] is a 73 y.o. 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 Other PMH includes DM and HLD. She presents with abdominal pain that began around noon today. The pain is diffuse and associated with bloating and nausea. To her, this feels like her prior SBO. Last BM this AM and last flatus earlier today.At the time of this consult, no eCART was calculated. Vitals WNL; afebrile, HR 70s, SBP 130s, on RA. CBC demonstrates WBC 17.9 with 86% neutrophils. BMP demonstrates Na 133 and Cl 96 with Cr 0.8. LFTs WNL w Tbili 0.6 and AST/ALT 28/19. Lipase 27. POC lactate reassuring at 0.9. [place] abdomen pelvis demonstrates high grade SBO in L hemiabdomen with mesenteric congestion, no free air or ischemia. Last dose of warfarin 10/8 evening. Review of Allergies/Medical History/Medications: PMH PSH Past Medical History[1] Past Surgical History[2] Social History Family History Social History[3] Family History Problem Relation Age of Onset  Bladder cancer Mother   Colon cancer Father 64  Diabetes Father   Diabetes Sister   Colon cancer Sister 43  Crohn's disease Sister 61  Colon cancer Brother 2  Colon cancer Paternal Aunt 58  Diabetes Paternal Uncle   Breast cancer Maternal Cousin 2 cousins with breast cancer  Colon cancer Paternal Cousin 66  Meds Allergies (Not in a hospital admission) Allergies[4] Physical Exam: Vitals:Vitals:  02/03/24 1535 02/03/24 1837 BP: (!) 147/82 138/88 Pulse: 76 70 Resp: (!) 22 19 Temp: 97.1 ?F (36.2 ?C) 98.1 ?F (36.7 ?C) TempSrc: Temporal Oral SpO2: 99% 99% Physical Exam:GEN: uncomfortable, but nontoxicCV: RRRESP: NL WOB ON RAABD: - R abdomen: soft, nontender, nondistended. When palpated, has pain in L abdomen- L abdomen: guarding, very tender to palpation, nondistended- No rebound, no pain after shaking the bed- Small inferior incisional herniaEXT: WWPLabs and Imaging CBCRecent Labs Lab 10/09/251622 10/09/251623 WBC 17.9*  --  HGB 13.2  --  HCT 42.40 46.0 PLT 367  --  LYTESRecent Labs Lab 10/09/251622 10/09/251623 NA 133*  --  K 4.9 4.4 CL 96*  --  CO2 28  --  BUN 14  --  CREATININE 0.80 1.0 GLU 165* 166* CALCIUM  9.6  --  LFTsRecent Labs Lab 10/09/251622 ALT 19 AST 28 ALKPHOS 120 BILITOT 0.6 LIPASE 27 PT/INR/PTTRecent Labs Lab 10/09/251622 INR 1.69* Diagnostics: No results found.Impression: Maria [name] is a 73 y.o. 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 Full operative history above. She now presents with high grade SBO.Her vitals are reassuring, as is her lactate of 0.9. However, her leukocytosis to 18 is concerning, and she is exquisitely tender to palpation over the left hemiabdomen. We will plan to attempt nonoperative management given clinical stability. NGT has been placed by EGS. Admit to EGS floor and perform serial abdominal exams. Recommendations: - Admit to EGS under Dr. Dane, OK for floor- NGT to LCWS, NPO, IVF- Serial abdominal  exams q4hDiscussed with Dr. Denney (chief resident) and Dr. Dane (attending).All plans are provisional and subject to change. Final plan pending attending note, edit, or attestation.Signed,Giankarlo Leamer J. Hopelynn Gartland, MDGeneral Surgery PGY-210/12/2023 [1] Past Medical History:Diagnosis Date  Acute ischemic colitis (HC Code) 2015  Depression   Diabetes mellitus (HC CODE)   Diabetes mellitus, type II (HC CODE)   Hypercholesteremia   Hypertension   Lupus anticoagulant disorder (HC Code)   Rosacea   Ventral hernia with bowel obstruction   Vertigo  [2] Past Surgical History:Procedure Laterality Date  ABDOMINAL ADHESION SURGERY  2016  BLEPHAROPLASTY Bilateral 05/2016  COLOSTOMY  05/27/13  COLOSTOMY CLOSURE    HYSTEROSCOPY W/ POLYPECTOMY  06/18/2020  KNEE LIGAMENT RECONSTRUCTION Right 2004  LAPAROSCOPIC NISSEN FUNDOPLICATION    2000 ?  SINUS SURGERY    THYROID BIOPSY    TUBAL LIGATION    UMBILICAL HERNIA REPAIR    VENTRAL HERNIA REPAIR   [3] Social HistoryTobacco Use  Smoking status: Never  Smokeless tobacco: Never Vaping Use  Vaping status: Never Used Substance Use Topics  Alcohol use: Not Currently   Alcohol/week: 1.0 standard drink of alcohol   Types: 1 Glasses of wine per week   Comment: 1-2/year  Drug use: No [4] AllergiesAllergen Reactions  Morphine  Hives and Itching   Pt tolerated morphine  during admission 08/08/16-08/09/16.  Pt premedicated with diphenhydramine   Biaxin  [Clarithromycin]   Latex, Natural Rubber Rash  Mitosol [Mitomycin] Rash  Oxycodone  Rash

## 2024-02-04 NOTE — Plan of Care
 Problem: Adult Inpatient Plan of CareGoal: Plan of Care ReviewOutcome: Interventions implemented as appropriate Plan of Care Overview/ Patient StatusPatient received from 24  with stable vital signs. NG patent with minimal drainage, patient states she is passing large amounts of flatus. Patient teaching reviewed regarding activity level, need for strict intake and output. Heparin  drip continues. NPO maintained, IV therapy continues. Reinforcement of teaching provided and questions encouraged. Ronal Guardian, RNPatient reported her blood sugar was dropping by her CGM, patient reported it as 70. Dr. Kathlee aware and D10 ordered, when attempting to give it patient stated her blood sugar via CGM was 98 and fiunger stick was 82. Dr. Kathlee aware that patient preferred to wait on the D10. Ronal Guardian, RN

## 2024-02-04 NOTE — Telephone Encounter
 Copied from CRM #[id number]. Topic: General Message - [facility] CARE>> Feb 04, 2024 11:15 AM Wadie Liew R wrote:Corvera, Solangel called stating that she is at [facility] and received a recording regarding her upcoming procedure with Dr. [place] 02/11/2024 and that she has to pick up prep. Pt unsure where the prescription was sent but is requesting prep to be sent to [facility] in [place]. Please advise, Best call back number [phone]

## 2024-02-04 NOTE — Progress Notes
 [place] [facility]SrcSpiritual Care NoteAssessment:  Religion:CatholicI visited [place] per overnight chaplain's referral. I introduced myself and my role, and she welcomed me very warmly. Prestyn engaged in life review, sharing about her beloved garden, husband, and cat at home. She explored memories of her childhood and [place] some of these memories bring her grounding/comforting feelings. I offered empathetic listening and spiritual/emotional support. Aeisha reflected on her faith, which is deeply important to her, she feels Jesus's presence always. I offered a prayer to Jesus for her continued strength, and that she be held in love always. Juleah thanked me for the visit, asking if I could come by later as it was time for her to watch daily mass in [place]. Intervention:  Referral Source: (P) Other ChaplainResponding Chaplain: (P) Chaplain Intern, Unit ChaplainConsulted With: (P) NurseVisit and Intervention Type: (P) Follow-up, Spiritual Visit Spiritual care interventions provided: Cultural, Religious or Spiritual Resources Interventions: : (P) Religious/Spiritual Materials, Prayer, Worship or Radiation protection practitioner or Interpersonal Interventions: : (P) Active Listening and or Reflection, Empathy, Grief Support, Help Communicating with the Health Care Team, Spiritual Support/Presence, CompanionshipMeaning-Making Interventions: : (P) Life Review and/or Story Listening, Re-framing, Facilitation of Spiritual Reflection/Meaning-Making Outcome: With the help of the chaplain, patient/loved one(s): Cultural, Religious or Spiritual Resources Outcomes: : (P) Felt More Respected Culturally or Personally, Used Proofreader for Resiliency or to [employer], Received Desired Rituals and/or PracticesRelational or Interpersonal Outcomes:: (P) Established a Relationship with the Chaplain, Felt Greater Comfort and/or Support, Expressed Emotions for Catharsis, Felt a Greater Sense of Belonging, Identified Relational Resources, Felt More at PeaceMeaning-Making Outcomes:: (P) Made Progress Toward Spiritual/Emotional Growth, Clarified Their Beliefs/Life Story Plan:  Will continue to follow, with spiritual care team, as needed. I provided education surrounding the availability of spiritual care moving forward and how she can request a follow-up visit if desired. Leyanna Bittman-LeeChaplain InternOn-Call ChaplainYSC [phone] Chaplains are available 24/7 for emotional, spiritual, religious, and existential support for all patients, their loved ones, and staff - for those who are religious and those who are not.When to Call a Chaplain:N: New diagnosis E: Emotional / spiritual distress E: Existential distress D: Decision making / goals of care meetingsS: Support for staff and patients' loved ones   C: Compromised copingA: Anxiety / stress / grief / loneliness R: Religious / cultural / ritual needsE: End-of-life care / death/dying10-26-2025 10:36 AM

## 2024-02-05 ENCOUNTER — Inpatient Hospital Stay: Admit: 2024-02-05 | Payer: Medicare (Managed Care)

## 2024-02-05 DIAGNOSIS — K56609 Unspecified intestinal obstruction, unspecified as to partial versus complete obstruction: Principal | ICD-10-CM

## 2024-02-05 LAB — CBC WITH AUTO DIFFERENTIAL
BKR WAM ABSOLUTE IMMATURE GRANULOCYTES.: 0.04 x 1000/ÂµL (ref 0.00–0.30)
BKR WAM ABSOLUTE LYMPHOCYTE COUNT.: 2.21 x 1000/ÂµL (ref 0.60–3.70)
BKR WAM ABSOLUTE NRBC: 0 x 1000/ÂµL (ref 0.00–1.00)
BKR WAM ANC (ABSOLUTE NEUTROPHIL COUNT): 9.76 x 1000/ÂµL — ABNORMAL HIGH (ref 2.00–7.60)
BKR WAM BASOPHIL ABSOLUTE COUNT.: 0.02 x 1000/ÂµL (ref 0.00–1.00)
BKR WAM BASOPHILS: 0.2 % (ref 0.0–1.4)
BKR WAM EOSINOPHIL ABSOLUTE COUNT.: 0.23 x 1000/ÂµL (ref 0.00–1.00)
BKR WAM EOSINOPHILS: 1.8 % (ref 0.0–5.0)
BKR WAM HEMATOCRIT: 36.7 % (ref 35.00–45.00)
BKR WAM HEMOGLOBIN: 11.3 g/dL — ABNORMAL LOW (ref 11.7–15.5)
BKR WAM IMMATURE GRANULOCYTES: 0.3 % (ref 0.0–1.0)
BKR WAM LYMPHOCYTES: 17 % (ref 17.0–50.0)
BKR WAM MCH: 25.2 pg — ABNORMAL LOW (ref 27.0–33.0)
BKR WAM MCHC: 30.8 g/dL — ABNORMAL LOW (ref 31.0–36.0)
BKR WAM MCV: 81.9 fL (ref 80.0–100.0)
BKR WAM MONOCYTE ABSOLUTE COUNT.: 0.77 x 1000/ÂµL (ref 0.00–1.00)
BKR WAM MONOCYTES: 5.9 % (ref 4.0–12.0)
BKR WAM MPV: 10.4 fL (ref 8.0–12.0)
BKR WAM NEUTROPHILS: 74.8 % — ABNORMAL HIGH (ref 39.0–72.0)
BKR WAM NUCLEATED RED BLOOD CELLS: 0 % (ref 0.0–1.0)
BKR WAM PLATELETS: 303 x1000/ÂµL (ref 150–420)
BKR WAM RDW-CV: 16.8 % — ABNORMAL HIGH (ref 11.0–15.0)
BKR WAM RED BLOOD CELL COUNT.: 4.48 M/ÂµL (ref 4.00–6.00)
BKR WAM WHITE BLOOD CELL COUNT: 13 x1000/ÂµL — ABNORMAL HIGH (ref 4.0–11.0)

## 2024-02-05 LAB — BASIC METABOLIC PANEL
BKR ANION GAP: 14 (ref 7–17)
BKR BLOOD UREA NITROGEN: 13 mg/dL (ref 8–23)
BKR BUN / CREAT RATIO: 16.3 (ref 8.0–23.0)
BKR CALCIUM: 8.9 mg/dL (ref 8.8–10.2)
BKR CHLORIDE: 98 mmol/L (ref 98–107)
BKR CO2: 24 mmol/L (ref 20–30)
BKR CREATININE DELTA: 0.1
BKR CREATININE: 0.8 mg/dL (ref 0.40–1.30)
BKR EGFR, CREATININE (CKD-EPI 2021): >60 mL/min/1.73m2 (ref >=60)
BKR GLUCOSE: 108 mg/dL — ABNORMAL HIGH (ref 70–100)
BKR POTASSIUM: 4 mmol/L (ref 3.3–5.3)
BKR SODIUM: 136 mmol/L (ref 136–144)

## 2024-02-05 LAB — PROTIME AND INR
BKR INR: 1.3 — ABNORMAL HIGH (ref 0.90–1.10)
BKR PROTHROMBIN TIME: 13.5 s — ABNORMAL HIGH (ref 9.0–12.0)

## 2024-02-05 LAB — PARTIAL THROMBOPLASTIN TIME     (BH GH LMW Q YH): BKR PARTIAL THROMBOPLASTIN TIME: 74 s — ABNORMAL HIGH (ref 23.0–31.0)

## 2024-02-05 LAB — MAGNESIUM: BKR MAGNESIUM: 1.7 mg/dL (ref 1.7–2.4)

## 2024-02-05 LAB — PHOSPHORUS     (BH GH L LMW YH): BKR PHOSPHORUS: 2.1 mg/dL — ABNORMAL LOW (ref 2.2–4.5)

## 2024-02-05 MED ORDER — ONDANSETRON 4 MG DISINTEGRATING TABLET
4 | Freq: Three times a day (TID) | Status: DC | PRN
Start: 2024-02-05 — End: 2024-02-06

## 2024-02-05 MED ORDER — FAMOTIDINE 20 MG TABLET
20 | Freq: Every day | ORAL | Status: DC
Start: 2024-02-05 — End: 2024-02-06
  Administered 2024-02-06: 08:00:00 20 mg via ORAL

## 2024-02-05 MED ORDER — INSULIN U-100 REGULAR HUMAN 100 UNIT/ML (CORRECTION SCALE)
100 | Freq: Before meals | SUBCUTANEOUS | Status: DC
Start: 2024-02-05 — End: 2024-02-06
  Administered 2024-02-06: 12:00:00 100 [IU]/mL via SUBCUTANEOUS

## 2024-02-05 NOTE — Plan of Care
 Plan of Care Overview/ Patient StatusSOCIAL WORK ASSESSMENTPatient Name: Maria RiveraMedical Record Number: [id number] Date of Birth: 11-13-1952Medical Social Work Assessment Adult  Loss adjuster, chartered Most Recent Value Rendered Accommodations (Leave blank if none rendered or patient/family supplied their own hearing devices/glasses)  Other language interpreter used (non-ASL)? No Admission Information  Document Type Clinical Assessment - Able to Assess (For Inpatient/ED Only) Prior psychosocial assessment has been documented within this hospitalization No (For Inpatient/ED Only) Prior psychosocial assessment has been documented within 30 days of this hospitalization No Reason for Current Social Work Involvement Support/Coping Source of Information Patient, Medical Team Medical Team Comment RN Record Reviewed Yes Level of Care Inpatient What medium(s) of communication were used with patient/family/caregiver? Face-to-Face / In-Person Assessment has been completed within 30 days of this encounter  (For Inpatient/ED Only) Prior psychosocial assessment has been documented within 30 days of this hospitalization No Relationships  Marital Status Married Lives With Spouse Family circumstances supportive familyand friends. Lives with husband of 55 years. She is oldest of 50 children Formal Supports (current community resources and providers) has supportive PCP Abuse Screen (yes response referral indicated)  Able to respond to abuse questions Yes Is there anyone in your life that is hurting or threatening you in anyway? no Physical Indicators of Abuse No evidence of physical abuse Read to patient We know that many of our patients and caregivers are exposed to violence in the home so we have started letting everyone know that [place]  has a safe, confidential and free 24/7 hotline called [place] Safe Connect no education not provided Would it be ok if we add this resource to your DC paperwork? no Language needed None, Patient Speaks English Literacy Read/write independently Social Determinants of Health  Financial Concerns None What is your living situation today? I have a steady place to live Think about the place you live. Do you have problems with any of the following? None Think about the place you live. Do you experience any of the following:  None Housing Instability: Regarding your current living situation, are you or have you been: None In the past 12 months has the electric, gas, oil, or water  company threatened to shut off services in your home? Yes Within the past 12 months, you worried that your food would run out before you got the money to buy more. Never true Within the past 12 months, the food you bought just didn't last and you didn't have money to get more. Never true In the past 12 months, has lack of transportation kept you from medical appointments or from getting medications? No In the past 12 months, has lack of transportation kept you from meetings, work, or from getting things needed for daily living? No Mental Status  Mental Status Able to Assess Appearance appears stated age Attitude/Demeanor/Rapport appropriate to circumstances, cooperative, pleasant Affect (typically observed) calm, pleasant, accepting Orientation no deficits recognized Insight Good Health Insight/Judgment no deficits recognized Reaction to Event/Health Status Accepting, Motivated, Hopeful Suicide Risk Assessment  Reason for Screening Utilizing SAFE-T and C-SSRS (Check all that apply) Social Work Consult/Assessment C-SSRS Able to Assess Screening for suicidal ideation within Past Month Have you wished you were dead or wished you could go to sleep and not wake up? no Have you actually had any thoughts of killing yourself? yes  [yes when overwhelmed but states will never at on it. Has much support] Have you been thinking about how you might kill yourself? no Have you had these thoughts and had some intention of acting  on them? no Have you started to work out or worked out the details of how to kill yourself? no Have you ever done anything, started to do anything, or prepared to do anything to end your life? no Preparation Comment pt had a prior attempt 40+ years ago were she went to the store,  bought a beer,  and attempted to OD on her antidepressants,  pt reports that her children found her and put her in the shower until she came to. Pt reports that there have been no other attempts. [place] Suicide Risk Level low risk Specific Questions about Thoughts, Plans, Suicidal Intent (SAFE-T) Negative responses above do not indicate a need for SAFE-T assessment Risk Assessment  Access to Firearms? No Concern for patient utilizing another lethal method of self-harm, suicide, or harm to others No Risk Assessment Able to Assess Risk to Self Able to Assess Risk to Self - Self-Injurious Behavior None identified Attitudes regarding Self-Injury None disclosed Imminent Risk for Self-Injury in Community Low Imminent Risk for Self-Injury in Facility Low Risk to Others Able to Assess Risk to Others Patient states no current harm to others Attitude regarding Aggression / Violence None Disclosed, Patient states no current urges to harm others Imminent Risk for Violence in Community Low Imminent Risk for Violence in Facility Low Current and Past Psychiatric Diagnoses Able to Assess Mood Disorder No Anxiety Disorder Recurrent/Current Psychotic Disorder No Substance Use Disorder No Post-Traumatic Stress Disorder (PTSD) No Attention Deficit/Hyperactivity Disorder (ADHD) No Traumatic Brain Injury (TBI) No Cluster B Personality Disorders or Traits (i.e. Borderline, Antisocial, Histrionic & Narcissistic) No Conduct Problems (Antisocial Behavior, Aggression, Impulsivity) No Other anxiety /depression Suicide Attempt 1 Prior Attempt  [over 40 years ago] Presenting Symptoms Anxiety and/or Panic  [anxiety over her medical issues] Family History None reported Precipitants/Stressors Chronic physical pain/Other acute medical, Stressful Life Events Change in Mental Health or Substance Use Disorder Treatment Not applicable General Risk Factors Depression, Prior suicide attempt General Protective Factors history of adaptive functioning, cooperative with interview, positive reality testing, able to express feelings, able to define needs, receptive to change, able to engage others, positive coping skills, fear of death or the actual act of killing self, expresses motivation to not act on destructive behaviors, future oriented, identifies reasons for living, spiritual and/or moral attitudes against suicide, cultural, positive social supports committed and able to help, responsibility to pets, responsibility to others This patient was screened using the [place] Suicide Severity Rating Scale (CSSRS)  Yes I conducted a suicide risk assessment including a suicide inquiry and assessment of risk and protective factors, as recommended by the standard Suicide Assessment Five-Step Evaluation and Triage (SAFE-T) for Mental Health Professionals. No, the C-SSRS did not produce a positive screen Cause for concern None Based on my assessment, the level of risk for this patient to suicide in an inpatient or emergency setting is:  LOW and will be safe on standard observations Based on my assessment, the level of risk for this patient to suicide in the community is:  LOW Recommended Next Steps Remain in/Return to Community Remain in/Return to [employer] Patient reports, Patient explains, Protective factors (see above), The benefit to the patient of remaining in the community is Patient reports No current ideation of suicide, No intent, No plan, No access to means of self-harm, No access to weapons Patient explains Suicide harms those who love/care about us , Identification/affirmation of reasons for living, Able to set aside suicidal thoughts/impulses The benefit to the patient of remaining in the community  is Engagement with family/friends, Maintaining place in society Substance Use  Active substance use No Coping  Reaction to Event/Health Status Accepting, Motivated, Hopeful Medically Ready for Discharge  Is this patient medically ready for discharge? No Expected Discharge Date 02/11/24 Discharge Next Day? No Discharge Plan  Expected Discharge Date 02/11/24 Formulation: Recommendation(s) and Intervention(s) (including for discharge to occur)  Psychosocial issues requiring intervention coping/ stress Psychosocial interventions 45 minutes spent with Ms. Lallier and her husband joined mid assessment. She gave permission to speak with his presence. I introduced myself and the role of social work. I actively listened and provided support.Social work consult was due to comment to nursing aobut suicidal ideation upon admission.   Ms. Al spoke to social worker yeterday and referals were sent for counseling support. Ms.Ribera has some anxiety around her diagnosis and medical concerns. Ms. Rohman states while she has had passing suicidal thoughts when her medical issues flair she states she would not act upon them. She states she has too much family and friends that she loves and that love and support her. We discussed her concern that her anxiety medication that she has been taking for 20 years is no longer workng. She will make an appointment with her PCP to discuss chagning to different medication. Her husband was in support of this and will assist. We discussed other stress reducing strategies that she would like to begin such as exercising more and joining local activity group. She denies any safety concerns or any substance use. I made her aware of social work availability should need arise. Collaborations rn Handoff Required? No Social Work will take lead to arrange post-acute care services and continue work with the care team as patient progresses towards discharge No Signature: Arlean Rinks, LCSW Contact Information: [phone]

## 2024-02-05 NOTE — Plan of Care
 Plan of Care Overview/ Patient StatusResumed care at 1700-1900. Patient reported one loose stool this shift.Education provided for low fiber diet. Slow and frequent meal. Patient tolerated well. Denied nausea. Ambulate independently. Tylenol  was given for mild abdominal pain with good effect. Husband at bedside for support.

## 2024-02-05 NOTE — Progress Notes
 SURGERY PROGRESS NOTETrauma & Emergency General SurgeryAttending Provider: Lenon Flatten B, *Interim History: -NAEO-Passing flatus-AXR : contrast in colon-NGT 625 (1000)-WBC 13 (18.2)Review of Allergies/Meds/Hx: I have reviewed the patient's: allergies, current medicationsData: Vitals:Temp:  [97.5 ?F (36.4 ?C)-98.9 ?F (37.2 ?C)] 98.9 ?F (37.2 ?C)Pulse:  [69-117] 94Resp:  [16-19] 17BP: (138-175)/(62-89) 147/75SpO2:  [94 %-99 %] 95 %Device (Oxygen Therapy): room air I/O's:I/O last 3 completed shifts:In: 590 [I.V.:500; Other:90]Out: 1000 [Other:1000]Exam: GEN: NADCV: RRRESP: NL WOB ON RAABD: soft, minimally distended and non tenderEXT: WWPObjective: Labs:CBCLab Results Component Value Date  WBC 18.2 (H) 02/04/2024  HGB 13.1 02/04/2024  HCT 43.10 02/04/2024  PLT 356 02/04/2024 LYTESLab Results Component Value Date  NA 134 (L) 02/04/2024  K 4.3 02/04/2024  CL 98 02/04/2024  CO2 23 02/04/2024  CREATININE 0.70 02/04/2024  BUN 13 02/04/2024  GLU 82 02/04/2024  CALCIUM  8.6 (L) 02/04/2024  MG 1.9 02/04/2024  PHOS 4.0 02/04/2024 LFTsLab Results Component Value Date  BILITOT 0.6 02/03/2024  BILIDIR  02/03/2024    Comment:    Sample Hemolyzed.   AST 28 02/03/2024  ALT 19 02/03/2024  ALKPHOS 120 02/03/2024  AMYLASE 41 08/22/2014  LIPASE 27 02/03/2024 PT/INR/PTTLab Results Component Value Date  INR 1.39 (H) 02/04/2024  PTT 59.7 (H) 02/04/2024 Assessment: Patient is a 73 y.o. 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 Presenting 10/9 with high grade SBO. She is currently passing flatus and AXR showed contrast in the colon. Plan: - Remove NGT- Clear liquid diet, HLIV if tolerates clears- Pain control- Therapeutic heparin  gtt given lupus anticoagulant and thrombosis history- Monitor UOP and I/Os- Home meds have been reviewed- Ambulation- Dispo pending clinical courseSigned:Samantha Eyen, DDSOral & Maxillofacial Surgery, PGY-2Sheethal Sasidhara PanickerGeneral Surgery, PGY-2Attending Addendum:

## 2024-02-05 NOTE — Other
 February 05, 2024 YOUR PERSONALIZED LIST OF SERVICES & PROGRAMSUtility Bill Payment AssistanceTOWN OF - ENERGY [address] [place], [place] [address](Distance: 0.3 miles)Website: http://www.hamden.com Language: Pensions consultant - Fuel [address] [place], [place] [address](Distance: 0.3 miles)Phone: [phone] Website: https://www.hamden.com/Directory.aspx Language: [place], Spanish Hours: Mon 8:30 AM - 4:30 PM Tue 8:30 AM - 4:30 PM Wed 8:30 AM - 4:30 PM Thu 8:30 AM - 4:30 PM Fri 8:30 AM - 4:30 PM Fee: [phone] [place]  - ELIBRARY TOPICAL PAPERS - ENERGY ASSISTANCE - UTILITIESWebsite: http://uwc.VariantTest.co.uk Language: [place] [facility] [phone]). Housing Resources ([employer]) 211. Domestic Violence 920 392 4796. Pregnancy1-952-112-4790. Substance Abuse800-4324882717. Suicide 988. Child 562-204-9517. . [name] JAY: These resources have been generated via the [employer]  Platform. [employer]  does not endorse any service providers mentioned in this resource list. [employer]  does not guarantee that the services mentioned in this resource list will be available to you or will improve your health or wellness.UTC

## 2024-02-05 NOTE — Plan of Care
 Problem: Adult Inpatient Plan of CareGoal: Plan of Care ReviewOutcome: Interventions implemented as appropriate Plan of Care Overview/ Patient StatusPatient is alert and oriented x4. VSS on room air. NPO. Ngtube to LCWS. Heparin  gtt as ordered 12units/kg/hr. PTT therapeutic. IVF as ordered. Patient OOB independent in room. Patient voiding in bathroom. SCDs. Pain managed with PRN dilaudid ; premedicated with 0.25mg  of benadryl . POC ongoing. Safety maintained, call bell in reach.BP (!) 165/79  - Pulse (!) 93  - Temp 98.8 ?F (37.1 ?C) (Oral)  - Resp 17  - Ht 4' 10 (1.473 m)  - Wt 74.8 kg  - SpO2 98%  - BMI 34.49 kg/m? Electronically Signed by Vina Schillings, RN, February 05, 2024

## 2024-02-05 NOTE — Plan of Care
 Problem: Adult Inpatient Plan of CareGoal: Plan of Care ReviewOutcome: Interventions implemented as appropriateFlowsheets (Taken 02/05/2024 1527)Plan of Care Reviewed With: patient spouse Plan of Care Overview/ Patient StatusPatient teaching provided regarding activity level, medication regime and anticoagulation therapy. Heparin  drip therapeutic, due for recheck in the AM. Patient had NG pulled this AM, advanced to clear liquids and well tolerated. Patient had a large BM this afternoon. Patient OOB ambulating well tolerated. Reinforcement of treatment plan provided and questions encouraged. Ronal Guardian, RN

## 2024-02-06 DIAGNOSIS — Z881 Allergy status to other antibiotic agents status: Secondary | ICD-10-CM

## 2024-02-06 DIAGNOSIS — I10 Essential (primary) hypertension: Secondary | ICD-10-CM

## 2024-02-06 DIAGNOSIS — Z86718 Personal history of other venous thrombosis and embolism: Secondary | ICD-10-CM

## 2024-02-06 DIAGNOSIS — D6862 Lupus anticoagulant syndrome: Secondary | ICD-10-CM

## 2024-02-06 DIAGNOSIS — Z888 Allergy status to other drugs, medicaments and biological substances status: Secondary | ICD-10-CM

## 2024-02-06 DIAGNOSIS — E7800 Pure hypercholesterolemia, unspecified: Secondary | ICD-10-CM

## 2024-02-06 DIAGNOSIS — Z886 Allergy status to analgesic agent status: Secondary | ICD-10-CM

## 2024-02-06 DIAGNOSIS — Z7901 Long term (current) use of anticoagulants: Secondary | ICD-10-CM

## 2024-02-06 DIAGNOSIS — E871 Hypo-osmolality and hyponatremia: Secondary | ICD-10-CM

## 2024-02-06 DIAGNOSIS — Z79899 Other long term (current) drug therapy: Secondary | ICD-10-CM

## 2024-02-06 DIAGNOSIS — Z794 Long term (current) use of insulin: Secondary | ICD-10-CM

## 2024-02-06 DIAGNOSIS — Z91040 Latex allergy status: Secondary | ICD-10-CM

## 2024-02-06 DIAGNOSIS — Z91048 Other nonmedicinal substance allergy status: Secondary | ICD-10-CM

## 2024-02-06 DIAGNOSIS — Z7984 Long term (current) use of oral hypoglycemic drugs: Secondary | ICD-10-CM

## 2024-02-06 DIAGNOSIS — E1165 Type 2 diabetes mellitus with hyperglycemia: Secondary | ICD-10-CM

## 2024-02-06 DIAGNOSIS — Z9049 Acquired absence of other specified parts of digestive tract: Secondary | ICD-10-CM

## 2024-02-06 DIAGNOSIS — K56609 Unspecified intestinal obstruction, unspecified as to partial versus complete obstruction: Secondary | ICD-10-CM

## 2024-02-06 LAB — CBC WITH AUTO DIFFERENTIAL
BKR WAM ABSOLUTE IMMATURE GRANULOCYTES.: 0.04 x 1000/ÂµL (ref 0.00–0.30)
BKR WAM ABSOLUTE LYMPHOCYTE COUNT.: 1.32 x 1000/ÂµL (ref 0.60–3.70)
BKR WAM ABSOLUTE NRBC: 0 x 1000/ÂµL (ref 0.00–1.00)
BKR WAM ANC (ABSOLUTE NEUTROPHIL COUNT): 10.67 x 1000/ÂµL — ABNORMAL HIGH (ref 2.00–7.60)
BKR WAM BASOPHIL ABSOLUTE COUNT.: 0.04 x 1000/ÂµL (ref 0.00–1.00)
BKR WAM BASOPHILS: 0.3 % (ref 0.0–1.4)
BKR WAM EOSINOPHIL ABSOLUTE COUNT.: 0.2 x 1000/ÂµL (ref 0.00–1.00)
BKR WAM EOSINOPHILS: 1.5 % (ref 0.0–5.0)
BKR WAM HEMATOCRIT: 38 % (ref 35.00–45.00)
BKR WAM HEMOGLOBIN: 11.7 g/dL (ref 11.7–15.5)
BKR WAM IMMATURE GRANULOCYTES: 0.3 % (ref 0.0–1.0)
BKR WAM LYMPHOCYTES: 10.1 % — ABNORMAL LOW (ref 17.0–50.0)
BKR WAM MCH: 25.2 pg — ABNORMAL LOW (ref 27.0–33.0)
BKR WAM MCHC: 30.8 g/dL — ABNORMAL LOW (ref 31.0–36.0)
BKR WAM MCV: 81.7 fL (ref 80.0–100.0)
BKR WAM MONOCYTE ABSOLUTE COUNT.: 0.8 x 1000/ÂµL (ref 0.00–1.00)
BKR WAM MONOCYTES: 6.1 % (ref 4.0–12.0)
BKR WAM MPV: 10.2 fL (ref 8.0–12.0)
BKR WAM NEUTROPHILS: 81.7 % — ABNORMAL HIGH (ref 39.0–72.0)
BKR WAM NUCLEATED RED BLOOD CELLS: 0 % (ref 0.0–1.0)
BKR WAM PLATELETS: 286 x1000/ÂµL (ref 150–420)
BKR WAM RDW-CV: 16.7 % — ABNORMAL HIGH (ref 11.0–15.0)
BKR WAM RED BLOOD CELL COUNT.: 4.65 M/ÂµL (ref 4.00–6.00)
BKR WAM WHITE BLOOD CELL COUNT: 13.1 x1000/ÂµL — ABNORMAL HIGH (ref 4.0–11.0)

## 2024-02-06 LAB — BASIC METABOLIC PANEL
BKR ANION GAP: 15 (ref 7–17)
BKR BLOOD UREA NITROGEN: 11 mg/dL (ref 8–23)
BKR BUN / CREAT RATIO: 18.3 (ref 8.0–23.0)
BKR CALCIUM: 9.1 mg/dL (ref 8.8–10.2)
BKR CHLORIDE: 99 mmol/L (ref 98–107)
BKR CO2: 24 mmol/L (ref 20–30)
BKR CREATININE DELTA: -0.2
BKR CREATININE: 0.6 mg/dL (ref 0.40–1.30)
BKR EGFR, CREATININE (CKD-EPI 2021): >60 mL/min/1.73m2 (ref >=60)
BKR GLUCOSE: 152 mg/dL — ABNORMAL HIGH (ref 70–100)
BKR POTASSIUM: 3.8 mmol/L (ref 3.3–5.3)
BKR SODIUM: 138 mmol/L (ref 136–144)

## 2024-02-06 LAB — PROTIME AND INR
BKR INR: 1.2 — ABNORMAL HIGH (ref 0.90–1.10)
BKR PROTHROMBIN TIME: 12.6 s — ABNORMAL HIGH (ref 9.0–12.0)

## 2024-02-06 LAB — PHOSPHORUS     (BH GH L LMW YH): BKR PHOSPHORUS: 2.3 mg/dL (ref 2.2–4.5)

## 2024-02-06 LAB — PARTIAL THROMBOPLASTIN TIME     (BH GH LMW Q YH): BKR PARTIAL THROMBOPLASTIN TIME: 72.2 s — ABNORMAL HIGH (ref 23.0–31.0)

## 2024-02-06 LAB — MAGNESIUM: BKR MAGNESIUM: 1.8 mg/dL (ref 1.7–2.4)

## 2024-02-06 MED ORDER — WARFARIN 7.5 MG TABLET
7.5 | Freq: Every day | ORAL | Status: DC
Start: 2024-02-06 — End: 2024-02-06
  Administered 2024-02-06: 11:00:00 7.5 mg via ORAL

## 2024-02-06 MED ORDER — WARFARIN THERAPY PLACEHOLDER
ORAL | Status: DC
Start: 2024-02-06 — End: 2024-02-06

## 2024-02-06 NOTE — Plan of Care
 Problem: Adult Inpatient Plan of CareGoal: Plan of Care ReviewOutcome: Interventions implemented as appropriate Plan of Care Overview/ Patient StatusPatient is alert and oriented x4. VSS on room air. Tolerating low fibr diet. Heparin  gtt as ordered 12units/kg/hr. PTT therapeutic. IVF as ordered. Patient OOB independent in room. Patient voiding in bathroom. SCDs. Pain managed with PRN dilaudid ; premedicated with 0.25mg  of benadryl . POC ongoing. Patient sleeping between care with visible chest rise and fall. Safety maintained, call bell in reach.BP (!) 159/79  - Pulse 85  - Temp 97.9 ?F (36.6 ?C) (Oral)  - Resp 17  - Ht 4' 10 (1.473 m)  - Wt 74.8 kg  - SpO2 98%  - BMI 34.49 kg/m? Electronically Signed by Vina Schillings, RN, February 06, 2024

## 2024-02-06 NOTE — Discharge Summary
 [facility] Discharge SummaryPatient Data:  Patient Name: Traniece Boffa Admit date: 02/03/2024 Age: 73 y.o. Discharge date: 02/06/24 DOB: 1951-01-17	 Discharge Attending Physician: Vicci Lizzie BROCKS, MD  MRN: [id number]	 Discharged Condition: good PCP: Festus Oliva BIRCH, MD  Disposition: Home  Principal Diagnosis: High grade SBO in L hemiabdomen with mesenteric congestion, no free air or ischemia Comorbidities Comorbidities present on admission:   Secondary diagnoses occurring during hospitalization:HyponatremiaHypophosphatemiaDiabetes with hyperglycemia  Post Discharge Follow Up Items: Issues to be Addressed Post Discharge:-Coumadin  with prescribing provider or clinicMedication changes on discharge (Full medication list at conclusion of this summary) :Pending Labs and Tests: Follow-up Information:[facility] [id number]Hamden [place]  [phone] follow up regarding outpatient theraputic services. Thank you. Future Appointments Date Time Provider Department Center 02/15/2024  2:40 PM Glo Herring, APRN Endo None 03/09/2024  1:00 PM Carolee Root, AuD COMM [facility] ENT - Audiol 03/09/2024  2:00 PM Lucien Alan Botts, PA ENT [facility] ENT - OS 05/19/2024  8:30 AM Meredeth, Harlene, MD [facility] EYE OPH COMP 06/09/2024  8:45 AM Meredeth, Harlene, MD [facility] EYE OPH COMP 07/07/2024 10:15 AM [facility] [place] [facility] LAB 07/07/2024 10:30 AM Joesph Landry Maxwell, NP [facility] CAD 07/11/2024  2:30 PM Meredeth Harlene, MD [facility] EYE OPH COMP 01/05/2025 11:00 AM [facility] [place] [facility] LAB 01/05/2025 11:20 AM Gladis Larraine Hummer, MD [facility] Hospital Course: 73 y.o. 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 Other PMH includes DM and HLD.Patient presented with diffuse abdominal pain on 10/9.  [place] abdomen pelvis demonstrated high grade SBO in L hemiabdomen with mesenteric congestion, no free air or ischemia. NGT placed. She was admitted to EGS for nonoperative management. Serial abdominal exams were performed.Her chronological hospital stay is as below:10/10: Pt with flatus, abdomen soft and non-distended. SBFT performed. Therapeutic heparin  gtt initiated. 10/11: Pt with flatus, AXR showing contrast in colon. NGT was removed10/12: Pt deemed stable for discharge. Coumadin  restarted, discussed with pharmacy. Pt instructed to f/u with their usual Coumadin  clinic on [address].Inpatient Consultants and summary of recommendations:Pertinent Procedures or Surgeries: Data: Pertinent lab findings:Microbiology:Imaging: Diet:  Diet Low FiberMobility: Highest Level of mobility - ACTUAL: Mobility Level 8, Walk 250+ feet AM PAC 24  Physical Exam Discharge vital signs: Vitals:  02/06/24 0749 BP: (!) 162/79 Pulse: (!) 99 Resp: 17 Temp: 97.7 ?F (36.5 ?C) Cognitive Status at Discharge: BaselinePhysical ExamGEN: NADCV: RRRESP: NL WOB ON RAABD: soft, minimally distended and non tenderEXT: WWPHistory  Allergies Allergies     Noted Reactions  Morphine  07/10/2016   Hives, Itching  Pt tolerated morphine  during admission 08/08/16-08/09/16.  Pt premedicated with diphenhydramine   Biaxin  [clarithromycin] 08/01/2013     Latex, Natural Rubber 08/15/2014   Rash  Mitosol [mitomycin] 01/01/2023   Rash  Oxycodone  10/30/2022   Rash    PMH PSH Past Medical History[1] Past Surgical History[2] Social History Family History Social History Tobacco Use  Smoking status: Never  Smokeless tobacco: Never Substance Use Topics  Alcohol use: Not Currently   Alcohol/week: 1.0 standard drink of alcohol   Types: 1 Glasses of wine per week   Comment: 1-2/year  Family History Problem  Relation Age of Onset  Bladder cancer Mother   Colon cancer Father 36  Diabetes Father   Diabetes Sister   Colon cancer Sister 28  Crohn's disease Sister 67  Colon cancer Brother 14  Colon cancer Paternal Aunt 59  Diabetes Paternal Uncle   Breast cancer Maternal Cousin       2 cousins with breast cancer  Colon cancer Paternal Cousin 39    Discharge Medications  Discharge:  Medication List  ASK your doctor about these medications    Dose Start Date Stop Date Comments Indications of Use acetaminophen  500 mg tabletCommonly known as: TYLENOLTake 1 tablet (500 mg total) by mouth every 4 (four) hours as needed. 500 mg     BENADRYL  ALLERGY  ORALTake 1 tablet by mouth as needed. 1 tablet     biotin 1 mg tabletTake 1 tablet (1,000 mcg total) by mouth daily. 1 tablet     BLOOD GLUCOSE METER deviceUse to check blood sugar 3 times per day (Type 2 Diabetes: E11.9 on insulin ). Contour      cholecalciferol (vitamin D3) 125 mcg (5,000 unit) tabletTake 1 tablet (5,000 Units total) by mouth every other day. 1 tablet     Contour Test Strips test stripsGeneric drug: blood sugar diagnosticUse to check blood sugar 3 times per day (Type 2 Diabetes: E11.9 on insulin ). Accu-chek      Fingerstix LancetsGeneric drug: lancetsUse to check blood sugar 3 times per day (Type 2 Diabetes: E11.9 on insulin ). Accu-chek      FreeStyle Libre 3 Plus Sensor deviceGeneric drug: blood-glucose sensorApply a new sensor every 15 days E11.9 on insulin       FreeStyle Libre 3 Reader MiscGeneric drug: blood-glucose,receiver,contUse as directed.     E11.9 on insulin  gabapentin  300 mg capsuleCommonly known as: NEURONTINTake 1 capsule (300 mg total) by mouth nightly. 300 mg     insulin  glargine 100 unit/mL vialCommonly known as: LantusInject 10 Units under the skin nightly. 10 Units     insulin  lispro 100 unit/mL penCommonly known as: HumaLOG  KwikPen InsulinInject 2 units with smaller carb meals and 4 units with larger carb meals      Jardiance  25 mg tabletGeneric drug: empagliflozinTake 1 tablet (25 mg total) by mouth daily. 25 mg     losartan  25 mg tabletCommonly known as: COZAARTake 1 tablet (25 mg total) by mouth daily. 25 mg     meclizine 12.5 mg tabletCommonly known as: ANTIVERTTake 1 tablet (12.5 mg total) by mouth daily as needed for Dizziness 12.5 mg     melatonin 10 mg TabTake 10 mg by mouth nightly as needed. 10 mg     multivitamin tabletTake 1 tablet by mouth daily. 1 tablet     Nano 2nd Gen Pen Needle 32 gauge x 5/32Generic drug: insulin  pen needleUSE WITH INSULIN  PEN UP TO 4 TIMES PER DAY    Needs to make f/u appt with Emma in 3 months  olopatadine  0.2 % ophthalmic solutionCommonly known as: PatadayPlace 1 drop into both eyes daily. 1 drop     ondansetron  4 mg tabletCommonly known as: ZOFRANTake 1 tablet (4 mg total) by mouth every 8 (eight) hours as needed for nausea. 4 mg     pravastatin  80 mg tabletCommonly known as: PRAVACHOLTake 1 tablet (80 mg total) by mouth nightly. 80 mg     senna 8.6 mg tabletCommonly known as: SENOKOTTake 2 tablets by mouth nightly.. 2 tablet     SITagliptin phosphate 100 mg tabletCommonly known as: JANUVIATake 1 tablet (100 mg total) by  mouth daily. 100 mg     sodium,potassium,mag sulfates 17.5-3.13-1.6 gram bowel prep kitCommonly known as: SUPREPTake by mouth See Admin Instructions. Dilute with water  to volume of 16 oz. beforehand;drink 1 bottle evening before & morning of procedure.      venlafaxine  XR 150 mg 24 hr capsuleCommonly known as: EFFEXOR  XRTake 1 capsule (150 mg total) by mouth daily. 150 mg     vitamin k 100 mcg tabletTake 1 tablet (100 mcg total) by mouth daily. 100 mcg     warfarin 5 mg tabletCommonly known as: COUMADINTake as directed. If you are unsure how to take this medication, talk to your nurse or doctor.Original instructions: Take 1.5 tablets (7.5 mg total) by mouth Daily @1800 .Ask about: Which instructions should I use? 7.5 mg        35 minutes spent on the discharge of this patientElectronically Signed:Raylen Ken L Arsh Feutz, DDS 02/06/2024 10:28 AMBest Contact Information: [phone] [1] Past Medical History:Diagnosis Date  Acute ischemic colitis (HC Code) 2015  Depression   Diabetes mellitus (HC CODE)   Diabetes mellitus, type II (HC CODE)   Hypercholesteremia   Hypertension   Lupus anticoagulant disorder (HC Code)   Rosacea   Ventral hernia with bowel obstruction   Vertigo  [2] Past Surgical History:Procedure Laterality Date  ABDOMINAL ADHESION SURGERY  2016  BLEPHAROPLASTY Bilateral 05/2016  COLOSTOMY  05/27/13  COLOSTOMY CLOSURE    HYSTEROSCOPY W/ POLYPECTOMY  06/18/2020  KNEE LIGAMENT RECONSTRUCTION Right 2004  LAPAROSCOPIC NISSEN FUNDOPLICATION    2000 ?  SINUS SURGERY    THYROID BIOPSY    TUBAL LIGATION    UMBILICAL HERNIA REPAIR    VENTRAL HERNIA REPAIR

## 2024-02-06 NOTE — Discharge Instructions
 Discharge InstructionsThe hospital and staff members extend their best wishes to you as you are discharged. Because we are most concerned with your health, we suggest you carefully read the following instructions.Diet:   - Low fiber, increase as toleratedMedications:- Take 650mg  tylenol  every 6hrs as needed for pain. Do not exceed 4000mg  tylenol  in 24hrsActivity Instructions - Do not drive/drink if taking narcotics pain medications. - No strenuous exercise, bending, pushing, heavy lifting > 10 lbs until advised by MD. - You may climb stairsSpecial Instructions: - Call MD or go to the ED if fever greater than 101.4, shaking chills, increase abdominal distension, intractable nausea/vomiting for more than 15 minutes, chest pain, shortness of breath, or pain not controlled with oral pain medications.- Please take over the counter stool softener (MiraLax , Senokot-S, ...) if taking narcotics. Stop if diarrhea.

## 2024-02-06 NOTE — Progress Notes
 SURGERY PROGRESS NOTETrauma & Emergency General SurgeryAttending Provider: Vicci Lizzie BROCKS, MDInterim History: -NAEO-Passing flatus-BM-Tolerating low fiber diet-AXR : contrast in colon-NGT 400 (625)-WBC 13.1 (13)Review of Allergies/Meds/Hx: I have reviewed the patient's: allergies, current medicationsData: Vitals:Temp:  [97.6 ?F (36.4 ?C)-98.1 ?F (36.7 ?C)] 98.1 ?F (36.7 ?C)Pulse:  [82-90] 90Resp:  [16-19] 16BP: (132-159)/(54-79) 136/74SpO2:  [95 %-100 %] 99 %Device (Oxygen Therapy): room air I/O's:I/O last 3 completed shifts:In: 575.5 [P.O.:500; I.V.:70.5; IV Piggyback:5]Out: 1600 [Urine:1200; Other:400]Exam: GEN: NADCV: RRRESP: NL WOB ON RAABD: soft, minimally distended and non tenderEXT: WWPObjective: Labs:CBCLab Results Component Value Date  WBC 13.1 (H) 02/06/2024  HGB 11.7 02/06/2024  HCT 38.00 02/06/2024  PLT 286 02/06/2024 LYTESLab Results Component Value Date  NA 138 02/06/2024  K 3.8 02/06/2024  CL 99 02/06/2024  CO2 24 02/06/2024  CREATININE 0.60 02/06/2024  BUN 11 02/06/2024  GLU 152 (H) 02/06/2024  CALCIUM  9.1 02/06/2024  MG 1.8 02/06/2024  PHOS 2.3 02/06/2024 LFTsLab Results Component Value Date  BILITOT 0.6 02/03/2024  BILIDIR  02/03/2024    Comment:    Sample Hemolyzed.   AST 28 02/03/2024  ALT 19 02/03/2024  ALKPHOS 120 02/03/2024  AMYLASE 41 08/22/2014  LIPASE 27 02/03/2024 PT/INR/PTTLab Results Component Value Date  INR 1.20 (H) 02/06/2024  PTT 72.2 (H) 02/06/2024 Assessment: Patient is a 73 y.o. 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 Presenting 10/9 with high grade SBO. She is currently passing flatus and AXR showed contrast in the colon. Plan: - D/c today- Restart Coumadin , d/c heparin . Pt should f/u with their usual Coumadin  clinic on [address] (discussed with patient and pharmacist)- Pain control- Monitor UOP and I/Os- Home meds have been reviewed- AmbulationSigned:Samantha Kathlee, DDSOral & Maxillofacial Surgery, PGY-2Attending Addendum:

## 2024-02-06 NOTE — Plan of Care
 Maria [name] was discharged via [employer] accompanied by husband.  The patient verbalized understanding of discharge instructionsand recommended follow up care as per the after visit summary.  Written discharge instructions provided. Denies any further questions. Vital signs    Vitals:  02/05/24 2316 02/06/24 0403 02/06/24 0749 02/06/24 1141 BP: (!) 159/79 136/74 (!) 162/79 (!) 176/75 Pulse: 85 90 (!) 99 (!) 103 Resp: 17 16 17 19  Temp: 97.9 ?F (36.6 ?C) 98.1 ?F (36.7 ?C) 97.7 ?F (36.5 ?C) 98.2 ?F (36.8 ?C) TempSrc: Oral Oral Oral Oral SpO2: 98% 99% 100% 100% Weight:     Height:     Patient confirmed all belongings returned. Belongings charted in last 7 days: Patient Valuables   Patient Valuables Flowsheet                    PATIENT VALUABLE(S)       Purse Disposition At bedside/locker/closet 02/04/24 0041  Clothing Disposition At bedside/locker/closet 02/04/24 0041   Other disposition At bedside/locker/closet 02/04/24 0041     Electronically Signed by Sula DELENA Libby Chandra, RN, February 06, 2024

## 2024-02-06 NOTE — Plan of Care
 Plan of Care Overview/ Patient StatusProblem: Adult Inpatient Plan of CareGoal: Readiness for Transition of CareOutcome: Outcome(s) achieved While on-call, patient medically cleared for discharge. Patient is aware of discharge POC and in agreement. Patient plans to return home, she declined HCS and DME needs. She will wait for her husband after work to transport for discharge.Case Management Plan  Flowsheet Row Most Recent Value Discharge Planning  Patient/Patient Representative goals/treatment preferences for discharge are:  return home to baseline Patient/Patient Representative was presented with a list of facilities, agencies and/or dme providers and Referral(s) placed for: None Mode of Transportation  Private car  (add comment for special considerations) Patient accompanied by husband Discharge Readiness  PASRR completed and approved N/A Authorization number obtained, if required N/A Is there a 3 day INPATIENT Qualifying stay for Medicare Patients? N/A Medicare IM- signed, dated, timed and scanned, if required N/A DME Authorized/Delivered N/A No needs identified/ follow up with PCP/MD/Outpatient Provider Yes Post acute care services secured W10 complete N/A PRI Completed and Accepted ([place]  Patients Only) N/A Is the destination address correct on the W10 N/A Is MVP Pathway complete? N/A Last Bowel Movement 02/05/24 Finalized Plan  Expected Discharge Date 02/06/24 Discharge Disposition Home or Self Care  Earnie Pan, [facility] ManagerYNHH Case Management Department

## 2024-02-07 ENCOUNTER — Encounter: Admit: 2024-02-07 | Payer: PRIVATE HEALTH INSURANCE | Primary: Internal Medicine

## 2024-02-07 NOTE — Progress Notes
 [facility] Post Discharge Outreach: Transition of Care NoteRisk of Unplanned Readmission: 11.81%PERTINENT INFORMATION:-  Has a PCP appt scheduled and a colonoscopy scheduled  -  TOC eligible through 10/26/2025[facility] Post Discharge Outreach spoke with: PatientDischarging Hospital: [facility] Discharge Date: 02/06/2024    Discharge location: HomeHOSPITALIZATION:Abdominal painSBOHigh grade SBO in L hemiabdomen with mesenteric congestion, no free air or ischemia NGT placed, non operative managementCURRENT STATE:Since discharge patient reports feeling: BetterFeeling batter than yesterday, very mild painPatient cared for by: IndependentSpouse   MEDICATION CHANGES:Validated NEW medications to take: N/AValidated Changed medications to take: N/AValidated Stopped medications to NOT take: N/AIssues obtaining prescriptions: N/A FOLLOW-UP APPOINTMENTS and TRANSPORTATION:Patient aware of scheduled appointments: YesAwareness and assistance with appointments needing to be scheduled: NoTransportation concerns for follow-up appointment: NoDME and HOME HEALTH SERVICES:Durable medical equipment received: N/AContact has been made with home care agency: N/APlan established for follow up labs/tests: Yes

## 2024-02-10 ENCOUNTER — Ambulatory Visit: Admit: 2024-02-10 | Payer: Medicare (Managed Care) | Attending: Hematology & Oncology | Primary: Internal Medicine

## 2024-02-10 ENCOUNTER — Encounter: Admit: 2024-02-10 | Payer: PRIVATE HEALTH INSURANCE | Primary: Internal Medicine

## 2024-02-10 ENCOUNTER — Inpatient Hospital Stay: Admit: 2024-02-10 | Discharge: 2024-02-10 | Payer: Medicare (Managed Care) | Primary: Internal Medicine

## 2024-02-10 ENCOUNTER — Ambulatory Visit: Admit: 2024-02-10 | Payer: PRIVATE HEALTH INSURANCE | Primary: Internal Medicine

## 2024-02-10 DIAGNOSIS — D6862 Lupus anticoagulant syndrome: Principal | ICD-10-CM

## 2024-02-10 DIAGNOSIS — E7800 Pure hypercholesterolemia, unspecified: Secondary | ICD-10-CM

## 2024-02-10 DIAGNOSIS — K55039 Acute (reversible) ischemia of large intestine, extent unspecified: Secondary | ICD-10-CM

## 2024-02-10 DIAGNOSIS — I10 Essential (primary) hypertension: Secondary | ICD-10-CM

## 2024-02-10 DIAGNOSIS — K436 Other and unspecified ventral hernia with obstruction, without gangrene: Secondary | ICD-10-CM

## 2024-02-10 DIAGNOSIS — Z7901 Long term (current) use of anticoagulants: Secondary | ICD-10-CM

## 2024-02-10 DIAGNOSIS — Z86718 Personal history of other venous thrombosis and embolism: Secondary | ICD-10-CM

## 2024-02-10 DIAGNOSIS — Z5181 Encounter for therapeutic drug level monitoring: Secondary | ICD-10-CM

## 2024-02-10 DIAGNOSIS — R42 Dizziness and giddiness: Secondary | ICD-10-CM

## 2024-02-10 DIAGNOSIS — E119 Type 2 diabetes mellitus without complications: Secondary | ICD-10-CM

## 2024-02-10 DIAGNOSIS — K559 Vascular disorder of intestine, unspecified: Secondary | ICD-10-CM

## 2024-02-10 DIAGNOSIS — L719 Rosacea, unspecified: Secondary | ICD-10-CM

## 2024-02-10 DIAGNOSIS — F32A Depression: Principal | ICD-10-CM

## 2024-02-10 LAB — PROTIME AND INR
BKR INR: 2.74 — ABNORMAL HIGH (ref 0.90–1.10)
BKR PROTHROMBIN TIME: 27.7 s — ABNORMAL HIGH (ref 9.0–12.0)

## 2024-02-10 NOTE — Progress Notes
 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  The patient was contacted regarding lab drawn INR results.  Patient's INR findings and prescribed dose from last visit:  01/26/2024   9:42 AM 02/03/2024  10:12 AM 02/10/2024   2:44 PM Anticoagulation Monitoring Assoc. INR Date 01/25/2024  02/10/2024 Associated INR 3.05 -- 2.74 Instructions 7.5 mg every day 10/12: Hold; 10/13: Hold; 10/14: Hold; 10/15: Hold; 10/16: Hold; Otherwise 7.5 mg every day 10/16: Hold; Otherwise 7.5 mg every day Sunday Dose 7.5 mg Hold (10/12); Otherwise 7.5 mg 7.5 mg Monday Dose 7.5 mg Hold (10/13); Otherwise 7.5 mg 7.5 mg Tuesday Dose 7.5 mg Hold (10/14) 7.5 mg Wednesday Dose 7.5 mg Hold (10/15) 7.5 mg Thursday Dose 7.5 mg Hold (10/16); Otherwise 7.5 mg Hold (10/16); Otherwise 7.5 mg Friday Dose 7.5 mg 7.5 mg 7.5 mg Saturday Dose 7.5 mg 7.5 mg 7.5 mg Last 7 day dose total 52.5 mg 52.5 mg 22.5 mg Next INR Date 02/15/2024 02/15/2024 02/24/2024 The following findings were reported from today's call (no findings if none selected):[]  Missed doses []  Extra doses []  Change in medications []  Change in vitamin k rich food intake (green vegetables, herbals, etc) []  Change in alcohol use []  Change in overall health [x]  Recent hospitalization / emergency department visit []  Upcoming procedure (including dental procedures) []  Other Comments:       Patient was admitted for SBO The following side effects were reported from today's call (no side effects if none selected):[]  Bleeding []  New bruising []  Warfarin-emergency department visit or hospital admission []  Other Comments:   No side effects Assessment/Plan:Patient's INR is 2.74, which is within goal range of 2.0-3.0.  Patient was admitted for SBO and her colonoscopy was postponed. There are no appointments until December but patient will let the warfarin clinic know when it is scheduled.  She was instructed to continue current weekly dose as indicated in the maintenance plan below.  Next INR assessment due 02/24/24.  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  Electronically Signed by Coy Lucienne, RPH, February 10, 2024 [facility] Anticoagulation Clinic  [address] [place], [place] [address]  Phone: [phone]  Fax: [phone]

## 2024-02-11 ENCOUNTER — Encounter: Admit: 2024-02-11 | Payer: PRIVATE HEALTH INSURANCE | Primary: Internal Medicine

## 2024-02-11 DIAGNOSIS — D6862 Lupus anticoagulant syndrome: Principal | ICD-10-CM

## 2024-02-11 DIAGNOSIS — Z5181 Encounter for therapeutic drug level monitoring: Secondary | ICD-10-CM

## 2024-02-11 DIAGNOSIS — Z86718 Personal history of other venous thrombosis and embolism: Secondary | ICD-10-CM

## 2024-02-18 ENCOUNTER — Encounter: Admit: 2024-02-18 | Payer: PRIVATE HEALTH INSURANCE | Primary: Internal Medicine

## 2024-02-18 DIAGNOSIS — D6862 Lupus anticoagulant syndrome: Principal | ICD-10-CM

## 2024-02-18 DIAGNOSIS — Z86718 Personal history of other venous thrombosis and embolism: Secondary | ICD-10-CM

## 2024-02-18 DIAGNOSIS — Z5181 Encounter for therapeutic drug level monitoring: Secondary | ICD-10-CM

## 2024-02-25 ENCOUNTER — Encounter: Admit: 2024-02-25 | Payer: PRIVATE HEALTH INSURANCE | Primary: Internal Medicine

## 2024-02-25 DIAGNOSIS — D6862 Lupus anticoagulant syndrome: Principal | ICD-10-CM

## 2024-02-25 DIAGNOSIS — Z5181 Encounter for therapeutic drug level monitoring: Secondary | ICD-10-CM

## 2024-02-25 DIAGNOSIS — Z86718 Personal history of other venous thrombosis and embolism: Secondary | ICD-10-CM

## 2024-02-29 ENCOUNTER — Telehealth: Admit: 2024-02-29 | Payer: PRIVATE HEALTH INSURANCE | Primary: Internal Medicine

## 2024-02-29 NOTE — Telephone Encounter [36]
 This morning I contacted patient, Maria [name] to remind her that she is due for her INR.  I spoke directly to the patient and she stated that she plans to go this afternoon or tomorrow for the blood work. Last documented INR is from 10/16.  This is the first reminder phone call. Tully GORMAN Running, CPHT11/4/202511:28 AMSmilow [facility]

## 2024-03-03 ENCOUNTER — Ambulatory Visit: Admit: 2024-03-03 | Payer: PRIVATE HEALTH INSURANCE | Attending: Pharmacotherapy | Primary: Internal Medicine

## 2024-03-03 ENCOUNTER — Inpatient Hospital Stay: Admit: 2024-03-03 | Discharge: 2024-03-03 | Payer: Medicare (Managed Care) | Primary: Internal Medicine

## 2024-03-03 ENCOUNTER — Encounter: Admit: 2024-03-03 | Payer: PRIVATE HEALTH INSURANCE | Primary: Internal Medicine

## 2024-03-03 DIAGNOSIS — Z7901 Long term (current) use of anticoagulants: Secondary | ICD-10-CM

## 2024-03-03 DIAGNOSIS — Z5181 Encounter for therapeutic drug level monitoring: Secondary | ICD-10-CM

## 2024-03-03 DIAGNOSIS — D6862 Lupus anticoagulant syndrome: Principal | ICD-10-CM

## 2024-03-03 DIAGNOSIS — Z794 Long term (current) use of insulin: Secondary | ICD-10-CM

## 2024-03-03 DIAGNOSIS — Z86718 Personal history of other venous thrombosis and embolism: Secondary | ICD-10-CM

## 2024-03-03 DIAGNOSIS — E1165 Type 2 diabetes mellitus with hyperglycemia: Principal | ICD-10-CM

## 2024-03-03 DIAGNOSIS — K559 Vascular disorder of intestine, unspecified: Secondary | ICD-10-CM

## 2024-03-03 LAB — COMPREHENSIVE METABOLIC PANEL
BKR A/G RATIO: 1.4 (ref 1.0–2.2)
BKR ALANINE AMINOTRANSFERASE (ALT): 20 U/L (ref 10–35)
BKR ALBUMIN: 4.2 g/dL (ref 3.6–5.1)
BKR ALKALINE PHOSPHATASE: 125 U/L — ABNORMAL HIGH (ref 9–122)
BKR ANION GAP: 11 (ref 7–17)
BKR ASPARTATE AMINOTRANSFERASE (AST): 20 U/L (ref 10–35)
BKR AST/ALT RATIO: 1
BKR BILIRUBIN TOTAL: 0.7 mg/dL (ref <=1.2)
BKR BLOOD UREA NITROGEN: 15 mg/dL (ref 8–23)
BKR BUN / CREAT RATIO: 17.6 (ref 8.0–23.0)
BKR CALCIUM: 9.5 mg/dL (ref 8.8–10.2)
BKR CHLORIDE: 100 mmol/L (ref 98–107)
BKR CO2: 27 mmol/L (ref 20–30)
BKR CREATININE DELTA: 0.25
BKR CREATININE: 0.85 mg/dL (ref 0.40–1.30)
BKR EGFR, CREATININE (CKD-EPI 2021): >60 mL/min/1.73m2 (ref >=60)
BKR GLOBULIN: 2.9 g/dL (ref 2.0–3.9)
BKR GLUCOSE: 149 mg/dL — ABNORMAL HIGH (ref 70–100)
BKR POTASSIUM: 4.8 mmol/L (ref 3.3–5.3)
BKR PROTEIN TOTAL: 7.1 g/dL (ref 5.9–8.3)
BKR SODIUM: 138 mmol/L (ref 136–144)

## 2024-03-03 LAB — LIPID PANEL
BKR CHOLESTEROL/HDL RATIO: 4.1 (ref 0.0–5.0)
BKR CHOLESTEROL: 180 mg/dL
BKR HDL CHOLESTEROL: 44 mg/dL (ref >=40)
BKR LDL CHOLESTEROL SAMPSON CALCULATED: 115 mg/dL — ABNORMAL HIGH
BKR TRIGLYCERIDES: 117 mg/dL

## 2024-03-03 LAB — PROTIME AND INR
BKR INR: 1.7 — ABNORMAL HIGH (ref 0.90–1.10)
BKR PROTHROMBIN TIME: 17.7 s — ABNORMAL HIGH (ref 9.0–12.0)

## 2024-03-03 LAB — ALBUMIN/CREATININE PANEL, URINE, RANDOM
BKR ALBUMIN, URINE, RANDOM: <12 mg/L
BKR CREATININE, URINE, RANDOM: 112 mg/dL

## 2024-03-03 NOTE — Progress Notes [1]
 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  The patient was contacted regarding lab drawn INR results.  Patient's INR findings and prescribed dose from last visit:  02/03/2024  10:12 AM 02/10/2024   2:44 PM 03/03/2024   4:36 PM Anticoagulation Monitoring Assoc. INR Date  02/10/2024 03/03/2024 Associated INR -- 2.74 1.70 Instructions 10/12: Hold; 10/13: Hold; 10/14: Hold; 10/15: Hold; 10/16: Hold; Otherwise 7.5 mg every day 10/16: Hold; Otherwise 7.5 mg every day 11/7: 10 mg; 11/11: 10 mg; Otherwise 10 mg every Tue, Fri; 7.5 mg all other days Sunday Dose Hold (10/12); Otherwise 7.5 mg 7.5 mg 7.5 mg Monday Dose Hold (10/13); Otherwise 7.5 mg 7.5 mg 7.5 mg Tuesday Dose Hold (10/14) 7.5 mg 10 mg (11/11) Wednesday Dose Hold (10/15) 7.5 mg 7.5 mg Thursday Dose Hold (10/16); Otherwise 7.5 mg Hold (10/16); Otherwise 7.5 mg 7.5 mg Friday Dose 7.5 mg 7.5 mg 10 mg (11/7) Saturday Dose 7.5 mg 7.5 mg 7.5 mg Last 7 day dose total 52.5 mg 22.5 mg 52.5 mg Next INR Date 02/15/2024 02/24/2024 03/10/2024 The following findings were reported from today's call (no findings if none selected):[]  Missed doses []  Extra doses []  Change in medications []  Change in vitamin k rich food intake (green vegetables, herbals, etc) []  Change in alcohol use []  Change in overall health []  Recent hospitalization / emergency department visit []  Upcoming procedure (including dental procedures) []  Other Comments:       No findings.  Patient is not sure if she missed a dose of warfarin this past week. The following side effects were reported from today's call (no side effects if none selected):[]  Bleeding []  New bruising []  Warfarin-emergency department visit or hospital admission []  Other Comments:   No side effects Assessment/Plan:Patient's INR is 1.7, which is below goal range of 2.0-3.0.  She was instructed to increase weekly warfarin dose about 10% to 10mg  on Friday and Tuesday and 7.5mg  on all other days of the week as indicated in the maintenance plan below.  Next INR assessment due 1 week.  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  Electronically Signed by [name], RPH, March 03, 2024 [facility] Anticoagulation Clinic  [address] [place], [place] [address]  Phone: [phone]  Fax: [phone]

## 2024-03-04 LAB — HEMOGLOBIN A1C
BKR ESTIMATED AVERAGE GLUCOSE: 180 mg/dL
BKR HEMOGLOBIN A1C: 7.9 % — ABNORMAL HIGH (ref 4.0–5.6)

## 2024-03-08 ENCOUNTER — Inpatient Hospital Stay: Admit: 2024-03-08 | Payer: Medicare (Managed Care)

## 2024-03-08 ENCOUNTER — Inpatient Hospital Stay
Admission: EM | Admit: 2024-03-08 | Discharge: 2024-03-12 | Payer: Medicare (Managed Care) | Attending: Surgical Critical Care | Admitting: Surgical Critical Care

## 2024-03-08 ENCOUNTER — Emergency Department: Admit: 2024-03-08 | Payer: Medicare (Managed Care) | Primary: Internal Medicine

## 2024-03-08 DIAGNOSIS — K56609 Unspecified intestinal obstruction, unspecified as to partial versus complete obstruction: Principal | ICD-10-CM

## 2024-03-08 LAB — CBC WITH AUTO DIFFERENTIAL
BKR WAM ABSOLUTE IMMATURE GRANULOCYTES.: 0.05 x 1000/ÂµL (ref 0.00–0.30)
BKR WAM ABSOLUTE LYMPHOCYTE COUNT.: 2.1 x 1000/ÂµL (ref 0.60–3.70)
BKR WAM ABSOLUTE NRBC: 0 x 1000/ÂµL (ref 0.00–1.00)
BKR WAM ANC (ABSOLUTE NEUTROPHIL COUNT): 12.17 x 1000/ÂµL — ABNORMAL HIGH (ref 2.00–7.60)
BKR WAM BASOPHIL ABSOLUTE COUNT.: 0.07 x 1000/ÂµL (ref 0.00–1.00)
BKR WAM BASOPHILS: 0.5 % (ref 0.0–1.4)
BKR WAM EOSINOPHIL ABSOLUTE COUNT.: 0.14 x 1000/ÂµL (ref 0.00–1.00)
BKR WAM EOSINOPHILS: 0.9 % (ref 0.0–5.0)
BKR WAM HEMATOCRIT: 42.4 % (ref 35.00–45.00)
BKR WAM HEMOGLOBIN: 13.6 g/dL (ref 11.7–15.5)
BKR WAM IMMATURE GRANULOCYTES: 0.3 % (ref 0.0–1.0)
BKR WAM LYMPHOCYTES: 13.7 % — ABNORMAL LOW (ref 17.0–50.0)
BKR WAM MCH: 26.2 pg — ABNORMAL LOW (ref 27.0–33.0)
BKR WAM MCHC: 32.1 g/dL (ref 31.0–36.0)
BKR WAM MCV: 81.5 fL (ref 80.0–100.0)
BKR WAM MONOCYTE ABSOLUTE COUNT.: 0.8 x 1000/ÂµL (ref 0.00–1.00)
BKR WAM MONOCYTES: 5.2 % (ref 4.0–12.0)
BKR WAM MPV: 11.1 fL (ref 8.0–12.0)
BKR WAM NEUTROPHILS: 79.4 % — ABNORMAL HIGH (ref 39.0–72.0)
BKR WAM NUCLEATED RED BLOOD CELLS: 0 % (ref 0.0–1.0)
BKR WAM PLATELETS: 390 x1000/ÂµL (ref 150–420)
BKR WAM RDW-CV: 16.8 % — ABNORMAL HIGH (ref 11.0–15.0)
BKR WAM RED BLOOD CELL COUNT.: 5.2 M/ÂµL (ref 4.00–6.00)
BKR WAM WHITE BLOOD CELL COUNT: 15.3 x1000/ÂµL — ABNORMAL HIGH (ref 4.0–11.0)

## 2024-03-08 LAB — HEPATIC FUNCTION PANEL
BKR A/G RATIO: 1 (ref 1.0–2.2)
BKR ALANINE AMINOTRANSFERASE (ALT): 24 U/L (ref 10–35)
BKR ALBUMIN: 4.2 g/dL (ref 3.6–5.1)
BKR ALKALINE PHOSPHATASE: 159 U/L — ABNORMAL HIGH (ref 9–122)
BKR ASPARTATE AMINOTRANSFERASE (AST): 28 U/L (ref 10–35)
BKR AST/ALT RATIO: 1.2
BKR BILIRUBIN DIRECT: <0.1 mg/dL (ref <=0.2)
BKR BILIRUBIN TOTAL: 0.3 mg/dL (ref <=1.2)
BKR GLOBULIN: 4.1 g/dL — ABNORMAL HIGH (ref 2.0–3.9)
BKR PROTEIN TOTAL: 8.3 g/dL (ref 5.9–8.3)

## 2024-03-08 LAB — BASIC METABOLIC PANEL
BKR ANION GAP: 15 (ref 7–17)
BKR BLOOD UREA NITROGEN: 16 mg/dL (ref 8–23)
BKR BUN / CREAT RATIO: 20 (ref 8.0–23.0)
BKR CALCIUM: 10.2 mg/dL (ref 8.8–10.2)
BKR CHLORIDE: 98 mmol/L (ref 98–107)
BKR CO2: 23 mmol/L (ref 20–30)
BKR CREATININE DELTA: -0.1
BKR CREATININE: 0.8 mg/dL (ref 0.40–1.30)
BKR EGFR, CREATININE (CKD-EPI 2021): >60 mL/min/1.73m2 (ref >=60)
BKR GLUCOSE: 185 mg/dL — ABNORMAL HIGH (ref 70–100)
BKR POTASSIUM: 4.5 mmol/L (ref 3.3–5.3)
BKR SODIUM: 136 mmol/L (ref 136–144)

## 2024-03-08 LAB — LIPASE: BKR LIPASE: 25 U/L (ref 11–55)

## 2024-03-08 MED ORDER — HYDROMORPHONE 1 MG/ML INJECTION SYRINGE
1 | Freq: Once | INTRAVENOUS | Status: DC
Start: 2024-03-08 — End: 2024-03-09

## 2024-03-08 MED ORDER — DEXTROSE 10 % IV BOLUS FOR D10 & D50 ORDERABLE FOR HYPOGLYCEMIA (DROPS CHARGE)
INTRAVENOUS | Status: DC | PRN
Start: 2024-03-08 — End: 2024-03-13

## 2024-03-08 MED ORDER — LIDOCAINE 2 % MUCOSAL JELLY IN APPLICATOR
2 | Freq: Once | URETHRAL | Status: DC
Start: 2024-03-08 — End: 2024-03-09

## 2024-03-08 MED ORDER — DEXTROSE 5 % AND 0.45 % SODIUM CHLORIDE INTRAVENOUS SOLUTION
INTRAVENOUS | Status: DC
Start: 2024-03-08 — End: 2024-03-12
  Administered 2024-03-09 – 2024-03-12 (×7): 1000.000 mL/h via INTRAVENOUS

## 2024-03-08 MED ORDER — FRUIT JUICE
ORAL | Status: DC | PRN
Start: 2024-03-08 — End: 2024-03-13

## 2024-03-08 MED ORDER — DEXTROSE 15 GRAM/60 ML ORAL LIQUID
15 | ORAL | Status: DC | PRN
Start: 2024-03-08 — End: 2024-03-13

## 2024-03-08 MED ORDER — INSULIN U-100 REGULAR HUMAN 100 UNIT/ML (CORRECTION SCALE)
100 | Freq: Four times a day (QID) | SUBCUTANEOUS | Status: DC
Start: 2024-03-08 — End: 2024-03-13
  Administered 2024-03-09 – 2024-03-12 (×14): 100 [IU]/mL via SUBCUTANEOUS

## 2024-03-08 MED ORDER — IOHEXOL 350 MG IODINE/ML INTRAVENOUS SOLUTION
350 | Freq: Once | INTRAVENOUS | Status: CP | PRN
Start: 2024-03-08 — End: ?
  Administered 2024-03-08: 22:00:00 350 mL via INTRAVENOUS

## 2024-03-08 MED ORDER — HYDROMORPHONE 1 MG/ML INJECTION SYRINGE
1 | Freq: Once | INTRAVENOUS | Status: CP
Start: 2024-03-08 — End: ?
  Administered 2024-03-08: 22:00:00 1 mL via INTRAVENOUS

## 2024-03-08 MED ORDER — HEPARIN 25,000 UNIT/250 ML D5W (HIGH RISK OF BLEEDING-ALL SITES)
25000 | INTRAVENOUS | Status: DC
Start: 2024-03-08 — End: 2024-03-13
  Administered 2024-03-09: 01:00:00 25000 mL/h via INTRAVENOUS

## 2024-03-08 MED ORDER — GLUCAGON 1 MG/ML IN STERILE WATER
Freq: Once | INTRAMUSCULAR | Status: DC | PRN
Start: 2024-03-08 — End: 2024-03-13

## 2024-03-08 MED ORDER — ONDANSETRON HCL (PF) 4 MG/2 ML INJECTION SOLUTION
4 | Freq: Once | INTRAVENOUS | Status: CP
Start: 2024-03-08 — End: ?
  Administered 2024-03-08: 21:00:00 4 mL via INTRAVENOUS

## 2024-03-08 MED ORDER — SODIUM CHLORIDE 0.9 % LARGE VOLUME SYRINGE FOR AUTOINJECTOR
Freq: Once | INTRAVENOUS | Status: CP | PRN
Start: 2024-03-08 — End: ?
  Administered 2024-03-08: 22:00:00 via INTRAVENOUS

## 2024-03-08 MED ORDER — ONDANSETRON HCL (PF) 4 MG/2 ML INJECTION SOLUTION
4 | Freq: Four times a day (QID) | INTRAVENOUS | Status: DC | PRN
Start: 2024-03-08 — End: 2024-03-13
  Administered 2024-03-09 – 2024-03-10 (×4): 4 mL via INTRAVENOUS

## 2024-03-08 MED ORDER — NF/RESTRICTED OR PATIENTS OWN MEDICATION REQUEST
Freq: Once | Status: DC
Start: 2024-03-08 — End: 2024-03-09

## 2024-03-08 MED ORDER — LIDOCAINE 2 % MUCOSAL JELLY IN APPLICATOR
2 | Status: DC
Start: 2024-03-08 — End: 2024-03-13

## 2024-03-08 MED ORDER — MELATONIN 3 MG TABLET
3 | Freq: Every evening | ORAL | Status: DC | PRN
Start: 2024-03-08 — End: 2024-03-13
  Administered 2024-03-11: 20:00:00 3 mg via ORAL

## 2024-03-08 MED ORDER — HYDROMORPHONE 1 MG/ML INJECTION SYRINGE
1 | Freq: Once | INTRAVENOUS | 1 refills | Status: CP
Start: 2024-03-08 — End: ?
  Administered 2024-03-08: 23:00:00 1 mL via INTRAVENOUS

## 2024-03-08 MED ORDER — PHENOL 1.4 % MUCOSAL AEROSOL SPRAY
1.4 | OROMUCOSAL | Status: DC | PRN
Start: 2024-03-08 — End: 2024-03-13

## 2024-03-08 MED ORDER — SODIUM CHLORIDE 0.9 % (FLUSH) INJECTION SYRINGE
0.9 | Freq: Three times a day (TID) | INTRAVENOUS | Status: DC
Start: 2024-03-08 — End: 2024-03-13
  Administered 2024-03-09: 07:00:00 0.9 mL via INTRAVENOUS

## 2024-03-08 MED ORDER — SODIUM CHLORIDE 0.9 % (FLUSH) INJECTION SYRINGE
0.9 | INTRAVENOUS | Status: DC | PRN
Start: 2024-03-08 — End: 2024-03-13

## 2024-03-08 NOTE — ED Notes [6]
 11:39 PM   Floor Handoff Telemetry: 	[x]  Yes		[]  NoCode Status:   [x]  Full		[]  DNR		[]  DNI		Other (specify):Safety Precautions: [x]  Fall Risk  []  Sitter   []  Restraints	[]  Suicidal	[]  None	Other (specify):Mentation/Orientation:	 A&O (Self, person, place, time) x     4     	 Disoriented to:                    	 Special Accommodations: []  Hearing impaired   []  Blind  []  Nonverbal  []  Cognitive impairmentOxygenation Upon Admission: [x]  RA	[]  NC	[]  Venti  []  Simple Mask []  Other	Baseline O2 Status? []  Yes	[]  NoAmbulation: [x]  Independent	[]  Cane   []  Walker	[]  Wheelchair	[]  Bedbound		[]  Hemiplegic	[]  Paraplegic	[]  QuadraplegicEliminiation: [x]  Independent	[]  Commode	[]  Bedpan/Urinal  []  Straight Cath []  Foley cath			[]  Urostomy	[]  Colostomy	Other (specify):Diarrhea/Loose stool : []  1x within 24h  []  2x within 24h  []  3x within 24h  [x]  None 	C.Diff Order: 	[]  Ordered- needs to be collected             []  Collected-sent to lab             []  Resulted - Negative C.Diff             []  Resulted - Positive C.Diff[]  Not Ordered   [x]  N/ASkin Alteration: []  Pressure Injury []  Wound []  None [x]  Skin not assessedDiet: []  Regular/No order placed	[x]  NPO		Other (specify):IV Access: [x]  PIV   []  PICC    []  Port    [] Central line    []  A-line    Other (specify)IVF/GTT Running Upon ED Departure? [x]  No	    []  Yes (specify):Outstanding Meds/Treatments/Tests:Meds Not Given: (as of time of note) 11:39 PM  Current Facility-Administered Medications Medication  lidocaine  uro-jet  lidocaine  uro-jet 		                                                 		Valuables Noted:  []  Dentures   []  Glasses  []  Hearing Aids []  None   Almarie ONEIDA Bolt, RN

## 2024-03-08 NOTE — ED Notes [6]
 11:39 PM Patient Care Admission Plan Review ToolPrimary ED Provider:Is the Patient and Family Aware of Status: [x]  YesPlan of Care Discussed: [x]  Yes	Plan of Care Goals:Concern for Sepsis?     []  Yes	[x]  NoIf Yes, Interventions Initiated to Address Sepsis: []  Blood Cultures and Initial Lactate[]  Fluids[]  Antibiotics[]  Repeat LactateImaging Pending: [x]  Yes    []  NoIf Yes, What Imaging Pending:Is the Patient Diabetic? [x]  Yes    []  NoIf Yes, When was the Last Glucose and Result? 183Lab Results Component Value Date/Time  GLU 183 (H) 03/08/2024 09:22 PM  GLU 185 (H) 03/08/2024 09:17 PM  GLU 236 (H) 10/24/2014 11:19 AM Is the Patient on an Insulin  Drip? []  Yes    [x]  NoConcern for DKA? []  Yes    [x]  NoIf Yes, Discussed with Provider:

## 2024-03-08 NOTE — Utilization Review [[id number]]
 Utilization Review from XsolisPatient Data:  Patient Name: Maria [name] Age: 73 y.o. DOB: 1950/09/21	 MRN: [id number]	 Indicated Status: Inpatient Review Comments: Patient admitted to surgical service for management of small bowel obstruction on [place] imaging, plan for NG tube placement.

## 2024-03-09 ENCOUNTER — Encounter: Admit: 2024-03-09 | Payer: PRIVATE HEALTH INSURANCE | Attending: Emergency Medicine | Primary: Internal Medicine

## 2024-03-09 ENCOUNTER — Inpatient Hospital Stay: Admit: 2024-03-09 | Payer: Medicare (Managed Care)

## 2024-03-09 ENCOUNTER — Encounter: Admit: 2024-03-09 | Payer: PRIVATE HEALTH INSURANCE | Attending: Audiologist | Primary: Internal Medicine

## 2024-03-09 DIAGNOSIS — K56609 Unspecified intestinal obstruction, unspecified as to partial versus complete obstruction: Principal | ICD-10-CM

## 2024-03-09 LAB — CBC WITH AUTO DIFFERENTIAL
BKR WAM ABSOLUTE IMMATURE GRANULOCYTES.: 0.05 x 1000/ÂµL (ref 0.00–0.30)
BKR WAM ABSOLUTE LYMPHOCYTE COUNT.: 1.25 x 1000/ÂµL (ref 0.60–3.70)
BKR WAM ABSOLUTE NRBC: 0 x 1000/ÂµL (ref 0.00–1.00)
BKR WAM ANC (ABSOLUTE NEUTROPHIL COUNT): 15.37 x 1000/ÂµL — ABNORMAL HIGH (ref 2.00–7.60)
BKR WAM BASOPHIL ABSOLUTE COUNT.: 0.05 x 1000/ÂµL (ref 0.00–1.00)
BKR WAM BASOPHILS: 0.3 % (ref 0.0–1.4)
BKR WAM EOSINOPHIL ABSOLUTE COUNT.: 0.04 x 1000/ÂµL (ref 0.00–1.00)
BKR WAM EOSINOPHILS: 0.2 % (ref 0.0–5.0)
BKR WAM HEMATOCRIT: 49 % — ABNORMAL HIGH (ref 35.00–45.00)
BKR WAM HEMOGLOBIN: 14.7 g/dL (ref 11.7–15.5)
BKR WAM IMMATURE GRANULOCYTES: 0.3 % (ref 0.0–1.0)
BKR WAM LYMPHOCYTES: 7.2 % — ABNORMAL LOW (ref 17.0–50.0)
BKR WAM MCH: 24.6 pg — ABNORMAL LOW (ref 27.0–33.0)
BKR WAM MCHC: 30 g/dL — ABNORMAL LOW (ref 31.0–36.0)
BKR WAM MCV: 82.1 fL (ref 80.0–100.0)
BKR WAM MONOCYTE ABSOLUTE COUNT.: 0.69 x 1000/ÂµL (ref 0.00–1.00)
BKR WAM MONOCYTES: 4 % (ref 4.0–12.0)
BKR WAM MPV: 11.2 fL (ref 8.0–12.0)
BKR WAM NEUTROPHILS: 88 % — ABNORMAL HIGH (ref 39.0–72.0)
BKR WAM NUCLEATED RED BLOOD CELLS: 0 % (ref 0.0–1.0)
BKR WAM PLATELETS: 356 x1000/ÂµL (ref 150–420)
BKR WAM RDW-CV: 17.1 % — ABNORMAL HIGH (ref 11.0–15.0)
BKR WAM RED BLOOD CELL COUNT.: 5.97 M/ÂµL (ref 4.00–6.00)
BKR WAM WHITE BLOOD CELL COUNT: 17.5 x1000/ÂµL — ABNORMAL HIGH (ref 4.0–11.0)

## 2024-03-09 LAB — MAGNESIUM: BKR MAGNESIUM: 2.1 mg/dL (ref 1.7–2.4)

## 2024-03-09 LAB — PROTIME AND INR
BKR INR: 6.6 — CR (ref 0.90–1.10)
BKR PROTHROMBIN TIME: 61.8 s — ABNORMAL HIGH (ref 9.0–12.0)

## 2024-03-09 LAB — PT/INR AND PTT (BH GH L LMW YH)
BKR INR: 5.61 — CR (ref 0.90–1.10)
BKR PARTIAL THROMBOPLASTIN TIME: 65.3 s — ABNORMAL HIGH (ref 23.0–31.0)
BKR PROTHROMBIN TIME: 53.1 s — ABNORMAL HIGH (ref 9.0–12.0)

## 2024-03-09 LAB — PHOSPHORUS     (BH GH L LMW YH): BKR PHOSPHORUS: 4.1 mg/dL (ref 2.2–4.5)

## 2024-03-09 LAB — BASIC METABOLIC PANEL
BKR ANION GAP: 15 (ref 7–17)
BKR BLOOD UREA NITROGEN: 16 mg/dL (ref 8–23)
BKR BUN / CREAT RATIO: 20 (ref 8.0–23.0)
BKR CALCIUM: 10.2 mg/dL (ref 8.8–10.2)
BKR CHLORIDE: 98 mmol/L (ref 98–107)
BKR CO2: 23 mmol/L (ref 20–30)
BKR CREATININE DELTA: -0.1
BKR CREATININE: 0.8 mg/dL (ref 0.40–1.30)
BKR EGFR, CREATININE (CKD-EPI 2021): >60 mL/min/1.73m2 (ref >=60)
BKR GLUCOSE: 233 mg/dL — ABNORMAL HIGH (ref 70–100)
BKR POTASSIUM: 4.5 mmol/L (ref 3.3–5.3)
BKR SODIUM: 136 mmol/L (ref 136–144)

## 2024-03-09 LAB — PARTIAL THROMBOPLASTIN TIME     (BH GH LMW Q YH)
BKR PARTIAL THROMBOPLASTIN TIME: 59.6 s — ABNORMAL HIGH (ref 23.0–31.0)
BKR PARTIAL THROMBOPLASTIN TIME: 115.2 s — ABNORMAL HIGH (ref 23.0–31.0)
BKR PARTIAL THROMBOPLASTIN TIME: 131.3 s — CR (ref 23.0–31.0)

## 2024-03-09 LAB — LACTIC ACID, PLASMA: BKR LACTATE: 0.9 mmol/L (ref 0.4–2.0)

## 2024-03-09 MED ORDER — ROSUVASTATIN 10 MG TABLET
10 | Freq: Every day | ORAL | Status: DC
Start: 2024-03-09 — End: 2024-03-13
  Administered 2024-03-12: 09:00:00 10 mg via ORAL

## 2024-03-09 MED ORDER — ACETAMINOPHEN 1,000 MG/100 ML (10 MG/ML) INTRAVENOUS SOLUTION
10 | Freq: Four times a day (QID) | INTRAVENOUS | Status: CP
Start: 2024-03-09 — End: ?
  Administered 2024-03-09 – 2024-03-10 (×4): 10 mL/h via INTRAVENOUS

## 2024-03-09 MED ORDER — DIPHENHYDRAMINE 50 MG/ML INJECTION (WRAPPED E-RX)
50 | Freq: Once | INTRAVENOUS | Status: CP
Start: 2024-03-09 — End: ?
  Administered 2024-03-09: 17:00:00 50 mL via INTRAVENOUS

## 2024-03-09 MED ORDER — HYDROMORPHONE 1 MG/ML INJECTION SYRINGE
1 | INTRAVENOUS | Status: DC | PRN
Start: 2024-03-09 — End: 2024-03-10
  Administered 2024-03-09 (×4): 1 mL via INTRAVENOUS

## 2024-03-09 MED ORDER — ACETAMINOPHEN 1,000 MG/100 ML (10 MG/ML) INTRAVENOUS SOLUTION
10 | Freq: Four times a day (QID) | INTRAVENOUS | Status: CP
Start: 2024-03-09 — End: ?
  Administered 2024-03-09 (×3): 10 mL/h via INTRAVENOUS

## 2024-03-09 MED ORDER — DIPHENHYDRAMINE 50 MG/ML INJECTION (WRAPPED E-RX)
50 | Freq: Once | INTRAVENOUS | Status: CP
Start: 2024-03-09 — End: ?
  Administered 2024-03-09: 10:00:00 50 mL via INTRAVENOUS

## 2024-03-09 MED ORDER — WARFARIN 7.5 MG TABLET
7.5 | ORAL | Status: DC
Start: 2024-03-09 — End: 2024-03-13

## 2024-03-09 MED ORDER — HYDROMORPHONE 0.5 MG/0.5 ML INJECTION SYRINGE
0.5 | INTRAVENOUS | Status: DC | PRN
Start: 2024-03-09 — End: 2024-03-10

## 2024-03-09 MED ORDER — WARFARIN 10 MG TABLET
10 | ORAL | Status: DC
Start: 2024-03-09 — End: 2024-03-13

## 2024-03-09 MED ORDER — GABAPENTIN 300 MG CAPSULE
300 | Freq: Every evening | ORAL | Status: DC
Start: 2024-03-09 — End: 2024-03-13
  Administered 2024-03-11: 20:00:00 300 mg via ORAL

## 2024-03-09 MED ORDER — WARFARIN THERAPY PLACEHOLDER
ORAL | Status: DC
Start: 2024-03-09 — End: 2024-03-13

## 2024-03-09 MED ORDER — IOHEXOL 350 MG IODINE/ML ORAL
350 | Freq: Once | NASOGASTRIC | Status: CP
Start: 2024-03-09 — End: ?
  Administered 2024-03-09: 15:00:00 350 mL via NASOGASTRIC

## 2024-03-09 MED ORDER — HYDROMORPHONE 1 MG/ML INJECTION SYRINGE
1 | Status: CP
Start: 2024-03-09 — End: ?
  Administered 2024-03-09: 16:00:00 1 mg/mL via INTRAVENOUS

## 2024-03-09 MED ORDER — VENLAFAXINE ER 75 MG CAPSULE,EXTENDED RELEASE 24 HR
75 | Freq: Every day | ORAL | Status: DC
Start: 2024-03-09 — End: 2024-03-13

## 2024-03-09 MED ORDER — LOSARTAN 25 MG TABLET
25 | Freq: Every day | ORAL | Status: DC
Start: 2024-03-09 — End: 2024-03-11

## 2024-03-09 NOTE — Plan of Care [1000001]
 Plan of Care Overview/ Patient StatusAlert and oriented. Using call bell for needs. NGT to LCWS. Clamped for 2 hours after giving contrast. Then unclamped per Clotilda Maxon PA. Did c/o pain during that time. Dilaudid  given with good effect. Tylenol  ATC continues. Remains NPO. 1:30 PTT 133.1 heparin  drip stopped for an hour restarted at 6u/kg/hr. Next ptt due at 8:30p. IVFs continue. Benadryl  given for pruritus. Oob to commode. Voiding. All meds/POC explained. 1800 addendum: patient c/o of abd pain and nausea. NGT flushed/patent. Per Dr. Hilarie ok to give dilaudid  early. Also made aware of nausea, zofran  given.  1930. Heparin  drip put on hold per Dr Hilarie d/t elevated 0430 INR result. Due for ptt/inr at 1830, will decide if heparin  drip to be resumed depending on results.

## 2024-03-09 NOTE — Pharmacy Note - Medication Reconciliation [[id number]]
 PHARMACY-ASSISTED MEDICATION REPORTPharmacy Student review of the best possible medication history obtained by the pharmacy medication history technician has been performed.  I have updated the home medication list and identified the following information that may be relevant to this admission.NOTES/RECOMMENDATIONS Can consider adding Jardiance  and Januvia if clinically appropriate.       Prior to Admission Medications Medication Name Sig Taking? Patient Reported   Discontinued: 03/09/2024  3:29 AMLast dose:  --        biotin 1 mg tabletLast dose:  -- Last Medication Note: >> SERRANO DIAZ, AMARYLIS   Dju Jul 16, 2018  6:52 PM Entered by Coni Coe, Amarylis, CPHT Sat Jul 16, 2018 8147 Take 1 tablet (1,000 mcg total) by mouth daily. 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 Accu-chek       blood-glucose sensor (FREESTYLE LIBRE 3 PLUS SENSOR) deviceLast dose:  --  Apply a new sensor every 15 days E11.9 on insulin        cholecalciferol, vitamin D3, 125 mcg (5,000 unit) tabletLast dose:  -- Last Medication Note: >> JOHNSTON, JACKIE   Sat Nov 21, 2016 10:17 AM Entered by Ethel American, PharmD Sat Nov 21, 2016 1017 Take 1 tablet (5,000 Units total) by mouth every other day. Yes Yes   diphenhydrAMINE  (BENADRYL ) 25 mg tabletLast dose:  -- Last Medication Note: >> CON ROSINA Heidelberg Mar 05, 2021  1:51 PM Entered by Con Rosina, CPHT Wed Mar 05, 2021 1351 Take 1 tablet (25 mg total) by mouth as needed. Yes Yes   FREESTYLE LIBRE 3 READER MiscLast dose:  --  Use as directed.       gabapentin  (NEURONTIN ) 300 mg capsuleLast dose:  --  Take 1 capsule (300 mg total) by mouth nightly. Yes Yes   insulin  glargine (SEMGLEE ,LANTUS ,BASAGLAR ) 100 unit/mL (3 mL) penLast dose:  --  Inject 10 Units under the skin nightly. Yes Yes   insulin  lispro (HUMALOG  KWIKPEN INSULIN ) 100 unit/mL penLast dose:  -- Last Medication Note: >> FERNAND, ARIFUL   Thu Mar 09, 2024 10:32 AMMedHxTech ([place]):  pt reports currently taking 4 units with smaller carb meals & 8 units will large carb mealsEntered by Fernand Blanks, CPHT Thu Mar 09, 2024 1032 Inject 2 units with smaller carb meals and 4 units with larger carb meals Yes     insulin  pen needle (NANO 2ND GEN PEN NEEDLE) 32 gauge x 5/32Last dose:  --  USE 1 WITH INSULIN  PEN UP TO 4 TIMES DAILY. NEED FOLLOW-UP APPOINTMENT.       JARDIANCE  25 mg tabletLast dose:  --  Take 1 tablet (25 mg total) by mouth daily. 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 Accu-chek       losartan  (COZAAR ) 25 mg tabletLast dose:  --  Take 1 tablet (25 mg total) by mouth daily. Yes Yes   meclizine (ANTIVERT) 12.5 mg tabletLast dose:  -- Last Medication Note: >> CON ROSINA Heidelberg Mar 05, 2021  1:48 PM Entered by Con Rosina, CPHT Wed Mar 05, 2021 1348 Take 1 tablet (12.5 mg total)  by mouth daily as needed for Dizziness Yes     melatonin 10 mg TabLast dose:  -- Last Medication Note: >> CON ROSINA Heidelberg Mar 05, 2021  1:51 PM Entered by Con Rosina, CPHT Wed Mar 05, 2021 1351 Take 10 mg by mouth nightly as needed.  Yes Yes   multivitamin tabletLast dose:  -- Last Medication Note: >> CON ROSINA Heidelberg Mar 05, 2021  1:51 PM Entered by Con Rosina, CPHT Wed Mar 05, 2021 1351 Take 1 tablet by mouth daily. Yes Yes   olopatadine  (PATADAY ) 0.2 % ophthalmic solutionLast dose: Not TakingLast Medication Note: >> FERNAND LARRAINE Schaumann Mar 09, 2024 10:33 AMMedHxTech ([place]): Flag for DELETION,  patient reports completed therapy. Entered by Fernand Larraine, CPHT Thu Mar 09, 2024 1033 Place 1 drop into both eyes daily.Patient not taking: Reported on 03/09/2024       ondansetron  (ZOFRAN ) 4 MG tabletLast dose:  -- Last Medication Note: >> CON ROSINA Heidelberg Mar 05, 2021  1:48 PM Entered by Con Rosina, CPHT Wed Mar 05, 2021 1348 Take 1 tablet (4 mg total) by mouth every 8 (eight) hours as needed for nausea. Yes Yes   phytonadione, vit K1, (VITAMIN K) 100 mcg tabletLast dose:  --  Take 1 tablet (100 mcg total) by mouth daily. Yes Yes   pravastatin  (PRAVACHOL ) 80 mg tabletLast dose:  -- Last Medication Note: >> CONI HAYES FRANCIS Schaumann Sep 10, 2022  8:01 PM Entered by Coni Hayes, Amarylis, CPHT Thu Sep 10, 2022 2001 Take 1 tablet (80 mg total) by mouth nightly. Yes Yes   senna (SENOKOT) 8.6 mg tabletLast dose:  -- Last Medication Note: >> CONI HAYES FRANCIS Schaumann Sep 10, 2022  8:03 PM Entered by Coni Hayes, Amarylis, CPHT Thu Sep 10, 2022 2003 Take 2 tablets by mouth nightly.. Yes     SITagliptin phosphate (JANUVIA) 100 mg tabletLast dose:  --  Take 1 tablet (100 mg total) by mouth daily. Yes     sodium,potassium,mag sulfates (SUPREP) 17.5-3.13-1.6 gram bowel prep kitLast dose: Not TakingLast Medication Note: >> FERNAND LARRAINE Schaumann Mar 09, 2024 10:33 AMMedHxTech ([place]): Flag for DELETION,  patient reports completed therapy. Entered by Fernand Larraine, CPHT Thu Mar 09, 2024 1033 Take by mouth See Admin Instructions. Dilute with water  to volume of 16 oz. beforehand;drink 1 bottle evening before & morning of procedure.Patient not taking: Reported on 03/09/2024       venlafaxine  (EFFEXOR  XR) 150 mg XR 24 hr extended release capsuleLast dose:  -- Last Medication Note: >> FINNEMORE, CAITLIN   Tue Apr 09, 2020  6:19 PM Entered by Karle Mora, CPHT Tue Apr 09, 2020 1819 Take 1 capsule (150 mg total) by mouth daily. Yes Yes   warfarin (COUMADIN ) 5 mg tabletLast dose:  -- Last Medication Note: >> FERNAND LARRAINE Schaumann Mar 09, 2024 10:34 AMMedHxTech ([place]):  pt reports taking 10 mg on Tuesdays & Fridays and 7.5 mg all other daysEntered by Fernand Larraine, CPHT Thu Mar 09, 2024 1034 Take 1.5 tablets (7.5 mg total) by mouth Daily @1800 . Yes Yes   Prior to admission medications last reviewed by Fernand Larraine, CPHT on Thu Mar 09, 2024 1035 Thank you,Tariah Transue Huntley11/13/20251:04 PMPhone/Epic Secure Chat

## 2024-03-09 NOTE — Progress Notes [1]
 Abdomen soft, not significantly tender, but remains slightly distended.Emery Pilot, MDCardiothoracic Surgery Resident11/13/2025 1:20 AM

## 2024-03-09 NOTE — Progress Notes [1]
 SURGERY PROGRESS NOTETrauma & Emergency General SurgeryAttending Provider: Joshua Norman Mann, MDInterim History: -Admitted 11/12 for SBO-Episode of nausea/wretching ON, no vomiting, improved serial abd examsReview of Allergies/Meds/Hx: I have reviewed the patient's: allergies, current medicationsData: Vitals:Temp:  [97.5 ?F (36.4 ?C)-98.9 ?F (37.2 ?C)] 98.3 ?F (36.8 ?C)Pulse:  [92-151] 92Resp:  [13-24] 16BP: (126-202)/(64-103) 126/64SpO2:  [95 %-98 %] 97 %Device: room air I/O's:I/O last 3 completed shifts:In: 297.1 [I.V.:212.1; IV Piggyback:85]Out: 250 [Urine:250]Exam: Gen:  NADCV:  RRRResp:  non-labored breathing on RAAbd:  soft, distended, tympanic, improved TTP in the LUQ. No guarding, rebound, or peritonitis Ext:  WWPObjective: Labs:CBCLab Results Component Value Date  WBC 17.5 (H) 03/09/2024  HGB 14.7 03/09/2024  HCT 49.00 (H) 03/09/2024  PLT 356 03/09/2024 LYTESLab Results Component Value Date  NA 136 03/09/2024  K 4.5 03/09/2024  CL 98 03/09/2024  CO2 23 03/09/2024  CREATININE 0.80 03/09/2024  BUN 16 03/09/2024  GLU 271 (H) 03/09/2024  CALCIUM  10.2 03/09/2024  MG 2.1 03/09/2024  PHOS 4.1 03/09/2024 LFTsLab Results Component Value Date  BILITOT 0.3 03/08/2024  BILIDIR <0.1 03/08/2024  AST 28 03/08/2024  ALT 24 03/08/2024  ALKPHOS 159 (H) 03/08/2024  AMYLASE 41 08/22/2014  LIPASE 25 03/08/2024 PT/INR/PTTLab Results Component Value Date  INR 6.60 (HH) 03/09/2024  PTT 115.2 (H) 03/09/2024 Assessment: Patient is 73 y.o. 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, [facility]wicz) ISO lupus anticoagulant (on warfarin), colostomy takedown (01/2014), ventral hernia c/b SBO (ex lap, LOA and ventral hernia repair w mesh 07/2014, Barcewicz), repeat SBO (ex lap, LOA, enterotomy repair 11/2015, Barcewicz), ventral hernia repair w mesh Sherrie 01/2020), and multiple admissions for SBOs last being 02/03/24-02/06/2024 presenting w/ SBO currently being managed non-operatively.Plan: - Reposition NGT- SBFT this afternoon- Pain control- Bowel regimen- Diet: NPO- Monitor UOP and I/Os- Home meds have been reviewed- DVT prophylaxis: heparin  gtt- IS hourly while awake and encourage ambulationSigned:Samantha Kathlee, DDSOral & Maxillofacial Surgery, PGY-2For questions: EPIC Secure ChatAttending Addendum:

## 2024-03-09 NOTE — Plan of Care [1000001]
 Problem: Adult Inpatient Plan of CareGoal: Plan of Care Review11/13/2025 0257 by Jerrye Motto, RNOutcome: Interventions implemented as appropriate11/13/2025 0257 by Jerrye Motto, RNOutcome: Interventions implemented as appropriate Plan of Care Overview/ Patient StatusAdmission Note Nursing Maria [name] is a 73 y.o. female admitted with a chief complaint of SBO. Patient arrived from ED Patient is  AxO x4, calm and engaging. Receiving Heparin  12 units/kg/hr PTT therapeutic at 1230 am, new PTT ordered for 6 am. Receiving D5 1/2 @ 5ml/hr. Meds and POC explained and patient verbalizes understanding. All safety precautions active, call bell in reach. BP 175/96, HR 100, Dee Re, MD contacted at 0151, no new orders. Arrived with continuous glucose monitor, contract signed, states husband will bring new sensor in the morning as it needs to be changed. Denies n/v. NGT to low continuous wall suction w/ adequate output. NPO, Q6 hour glucose monitoring. Endorses generalized itchiness from dry skin, provider contacted. Voiding adequately. OOB independently, states I only use a cane sometimes. 0151: Provider, Dee Re, MD contacted for BP 175/96, HR 100, endorses continued pain 7/10 after IV tylenol . Received no new orders. [id number] Contacted Provider, Dee Re, MD for 9/10 pain, received no new orders.0450: Bp 181/103, endorsing nausea w/ emesis, provider Dee Re, MD called, with no answer0511: BP 202/88, NGT flushed by Alm, RN, and provider, received 1mg  of dilaudid  0517: Bp 162/73, NGT displaced, taped to nostril 70 at the nares. Xray ordered.  0533: PTT 115.2, holding heparin  for 1 hr per FJM.9366: Heparin  restarted Electronically Signed by Motto Jerrye, RN, March 09, 2024 2:58 AM - 7pm-7:30am  Vitals:  03/08/24 2036 03/09/24 0031 03/09/24 0146 03/09/24 0230 BP: (!) 187/78 (!) 175/84 (!) 175/96  Pulse: (!) 104 (!) 115 (!) 100  Resp: 20 18 (!) 13  Temp: 97.8 ?F (36.6 ?C) 97.5 ?F (36.4 ?C) 97.6 ?F (36.4 ?C)  TempSrc: Oral Oral   SpO2: 98% 96% 98%  Weight: 76 kg   76 kg Height:    4' 10 (1.473 m) Oxygen therapy Oxygen TherapySpO2: 98 %Device: room airI have reviewed the patient's current medication orders.     Current Facility-Administered Medications Medication Dose Route Frequency Provider Last Rate Last Admin  acetaminophen  (OFIRMEV ) 10 mg/mL IVPB 1,000 mg  1,000 mg Intravenous Q6H Dee Re, MD 400 mL/hr at 03/09/24 0102 1,000 mg at 03/09/24 0102  insulin  regular human (HumuLIN R , NovoLIN R) Correction Scale 1-18 Units  1-18 Units Subcutaneous Q6H Sapir, Netanel, MD   2 Units at 03/09/24 0129  lidocaine  uro-jet (XYLOCAINE ) 2 % jelly 11 mL  11 mL INTRA-URETHRAL Once Carrol Charleston, MD      lidocaine  uro-jet (XYLOCAINE ) 2 % jelly 11 mL  11 mL INTRA-URETHRAL Once Carrol Charleston, MD      sodium chloride  0.9 % flush 3 mL  3 mL IV Push Q8H Sapir, Netanel, MD       D5 1/2 NS 75 mL/hr (03/09/24 0123)  heparin  25,000 units/250 ml infusion - High Risk of Bleeding 12 Units/kg/hr (03/09/24 0121) dextrose  (GLUCOSE) oral liquid 15 g **OR** fruit juice, dextrose  (GLUCOSE) oral liquid 30 g **OR** fruit juice, dextrose  injection, dextrose  injection, glucagon , melatonin, ondansetron  (PF), phenoL, sodium chlorideCurrent Facility-Administered Medications Medication Dose Route Frequency Last Rate  acetaminophen   1,000 mg Intravenous Q6H 1,000 mg (03/09/24 0102)  D5 1/2 NS  75 mL/hr Intravenous Continuous 75 mL/hr (03/09/24 0123)  dextrose  (GLUCOSE) oral liquid 15 g  15 g Oral Q15 MIN PRN    Or  fruit juice  120 mL Oral Q15  MIN PRN    dextrose  (GLUCOSE) oral liquid 30 g  30 g Oral Q15 MIN PRN    Or  fruit juice  240 mL Oral Q15 MIN PRN    dextrose  injection  12.5 g Intravenous Q15 MIN PRN    dextrose  injection  25 g Intravenous Q15 MIN PRN    glucagon   1 mg Intramuscular Once PRN    heparin  25,000 units/250 ml infusion - High Risk of Bleeding  7-24 Units/kg/hr Intravenous Continuous 12 Units/kg/hr (03/09/24 0121)  insulin  regular human  1-18 Units Subcutaneous Q6H    lidocaine  uro-jet  11 mL INTRA-URETHRAL Once    lidocaine  uro-jet  11 mL INTRA-URETHRAL Once    melatonin  6 mg Oral Nightly PRN    ondansetron  (PF)  4 mg IV Push Q6H PRN    phenoL  2 spray Mouth/Throat Q4H PRN    sodium chloride   3 mL IV Push Q8H    sodium chloride   3 mL IV Push PRN for Line Care   .I have reviewed patient valuables Belongings charted in last 7 days: Patient Valuables   Patient Valuables Flowsheet                    PATIENT VALUABLE(S)       Cell phone disposition At bedside/locker/closet 03/09/24 0244  Vision Disposition At bedside/locker/closet 03/09/24 0244   Purse Disposition At bedside/locker/closet 03/09/24 0244     Comments:see aboveSee flowsheets, patient education and plan of care for additional information.

## 2024-03-09 NOTE — Progress Notes [1]
 Messaged by nurse regarding patient vomitting and SBP of 189 and tachycardia of 119. Upon arrival patient was in significant pain, no actual emesis but some wretching. Abd was soft, distension appeared improved from previous exam, remained with tenderness more pronounced in left side. No evidence of rebound or guarding on exam. NG tube was flushed and draining appropriately. CXR was ordered to confirm placement after it was slightly dislodged. 1mg  of IV dilaudid  was administered with significant improvement in pain control. Per discussion with Dr. Cardell patient was similarly hypertensive and tachycardic and in pain with Left sided tenderness when seen in the ED. Given lack of evidence of peritonitis on exam, decision made to not rescan and continue to monitor. Emery Pilot, MDCardiothoracic Surgery Resident11/13/2025 5:33 AM

## 2024-03-09 NOTE — Plan of Care [1000001]
 Problem: Adult Inpatient Plan of CareGoal: Readiness for Transition of CareOutcome: Initial problem identification Plan of Care Overview/ Patient StatusPer chart - Patient is 73 y.o. 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 CM met patient at bedside, introduced self and role in care management. Patient has readmitted, MVP pathway completed. Patient lives with husband in a three level house. Patient's bedroom is on the second, stairs with railing, patient ambulates independently, uses cane only on as needed base, no other DMEs, no HCS. No DC needs anticipated. Husband transports. Case Management/Social Work Geophysicist/field Seismologist Most Recent Value Case Management/Social Work Screening: Chart review completed. If YES to any question below then proceed to Eval/Plan  Is there a change in their cognitive function No Do you anticipate that the patient will have any discharge needs requiring CM/SW intervention? No Has there been an unscheduled readmission within the past 30 days and/or >four (4) admissions within 12 months? No Were there services prior to admission ( Examples: Acute [facility],  Assisted Living, HD, Homecare, Extended Care Facility, Methadone, SNF, Outpatient Infusion Center) No Concern for current abuse/neglect/interpersonal violence/sexual assault  No Connected to state agency? No Guardianship or conservatorship No Negative/Positive Screen Negative Screening: Case Management department will follow patient's progress and discuss plan of care with treatment team. Case Manager/Social Work Attestation  I have reviewed the medical record and completed the above screen. CM/SW staff will follow patient's progress and discuss the plan of care with the Treatment Team. Yes Housing / Transportation/ Environment  What is your living situation today? I have a steady place to live Think about the place you live. Do you have problems with any of the following? None In the past 12 months has the electric, gas, oil, or water  company threatened to shut off services in your home? No Within the past 12 months, you worried that your food would run out before you got the money to buy more. Never true Within the past 12 months, the food you bought just didn't last and you didn't have money to get more. Never true In the past 12 months, has lack of transportation kept you from medical appointments or from getting medications? no In the past 12 months, has lack of transportation kept you from meetings, work, or from getting things needed for daily living? No  Abuse Screen  Is there anyone in your life that is hurting or threatening you in anyway? no Read to patient We know that many of our patients and caregivers are exposed to violence in the home so we have started letting everyone know that [place]  has a safe, confidential and free 24/7 hotline called [place] Safe Connect yes education provided Would it be ok if we add this resource to your DC paperwork? no Abuse Screen (yes response referral indicated)  Able to respond to abuse questions Yes Is there anyone in your life that is hurting or threatening you in anyway? no Physical Indicators of Abuse No evidence of physical abuse Read to patient We know that many of our patients and caregivers are exposed to violence in the home so we have started  letting everyone know that [place]  has a safe, confidential and free 24/7 hotline called [place] Safe Connect yes education provided Would it be ok if we add this resource to your DC paperwork? no Erin Humble RN BSNCare ManagerYale National Jewish Health HospitalMHB [phone]

## 2024-03-10 ENCOUNTER — Inpatient Hospital Stay: Admit: 2024-03-10 | Payer: Medicare (Managed Care)

## 2024-03-10 DIAGNOSIS — K56609 Unspecified intestinal obstruction, unspecified as to partial versus complete obstruction: Secondary | ICD-10-CM

## 2024-03-10 LAB — PT/INR AND PTT (BH GH L LMW YH)
BKR INR: 5.77 — CR (ref 0.90–1.10)
BKR PARTIAL THROMBOPLASTIN TIME: 69.2 s — ABNORMAL HIGH (ref 23.0–31.0)
BKR PROTHROMBIN TIME: 54.5 s — ABNORMAL HIGH (ref 9.0–12.0)

## 2024-03-10 LAB — CBC WITH AUTO DIFFERENTIAL
BKR WAM ABSOLUTE IMMATURE GRANULOCYTES.: 0.03 x 1000/ÂµL (ref 0.00–0.30)
BKR WAM ABSOLUTE LYMPHOCYTE COUNT.: 1.23 x 1000/ÂµL (ref 0.60–3.70)
BKR WAM ABSOLUTE NRBC: 0 x 1000/ÂµL (ref 0.00–1.00)
BKR WAM ANC (ABSOLUTE NEUTROPHIL COUNT): 10.66 x 1000/ÂµL — ABNORMAL HIGH (ref 2.00–7.60)
BKR WAM BASOPHIL ABSOLUTE COUNT.: 0.03 x 1000/ÂµL (ref 0.00–1.00)
BKR WAM BASOPHILS: 0.2 % (ref 0.0–1.4)
BKR WAM EOSINOPHIL ABSOLUTE COUNT.: 0.03 x 1000/ÂµL (ref 0.00–1.00)
BKR WAM EOSINOPHILS: 0.2 % (ref 0.0–5.0)
BKR WAM HEMATOCRIT: 43.1 % (ref 35.00–45.00)
BKR WAM HEMOGLOBIN: 13.5 g/dL (ref 11.7–15.5)
BKR WAM IMMATURE GRANULOCYTES: 0.2 % (ref 0.0–1.0)
BKR WAM LYMPHOCYTES: 9.7 % — ABNORMAL LOW (ref 17.0–50.0)
BKR WAM MCH: 25.8 pg — ABNORMAL LOW (ref 27.0–33.0)
BKR WAM MCHC: 31.3 g/dL (ref 31.0–36.0)
BKR WAM MCV: 82.3 fL (ref 80.0–100.0)
BKR WAM MONOCYTE ABSOLUTE COUNT.: 0.74 x 1000/ÂµL (ref 0.00–1.00)
BKR WAM MONOCYTES: 5.8 % (ref 4.0–12.0)
BKR WAM MPV: 11.3 fL (ref 8.0–12.0)
BKR WAM NEUTROPHILS: 83.9 % — ABNORMAL HIGH (ref 39.0–72.0)
BKR WAM NUCLEATED RED BLOOD CELLS: 0 % (ref 0.0–1.0)
BKR WAM PLATELETS: 331 x1000/ÂµL (ref 150–420)
BKR WAM RDW-CV: 16.6 % — ABNORMAL HIGH (ref 11.0–15.0)
BKR WAM RED BLOOD CELL COUNT.: 5.24 M/ÂµL (ref 4.00–6.00)
BKR WAM WHITE BLOOD CELL COUNT: 12.7 x1000/ÂµL — ABNORMAL HIGH (ref 4.0–11.0)

## 2024-03-10 LAB — BASIC METABOLIC PANEL
BKR ANION GAP: 14 (ref 7–17)
BKR BLOOD UREA NITROGEN: 14 mg/dL (ref 8–23)
BKR BUN / CREAT RATIO: 20 (ref 8.0–23.0)
BKR CALCIUM: 9.2 mg/dL (ref 8.8–10.2)
BKR CHLORIDE: 97 mmol/L — ABNORMAL LOW (ref 98–107)
BKR CO2: 22 mmol/L (ref 20–30)
BKR CREATININE DELTA: -0.1
BKR CREATININE: 0.7 mg/dL (ref 0.40–1.30)
BKR EGFR, CREATININE (CKD-EPI 2021): >60 mL/min/1.73m2 (ref >=60)
BKR GLUCOSE: 189 mg/dL — ABNORMAL HIGH (ref 70–100)
BKR POTASSIUM: 4.2 mmol/L (ref 3.3–5.3)
BKR SODIUM: 133 mmol/L — ABNORMAL LOW (ref 136–144)

## 2024-03-10 LAB — PHOSPHORUS     (BH GH L LMW YH): BKR PHOSPHORUS: 3.2 mg/dL (ref 2.2–4.5)

## 2024-03-10 LAB — MAGNESIUM: BKR MAGNESIUM: 2 mg/dL (ref 1.7–2.4)

## 2024-03-10 MED ORDER — HYDROMORPHONE 0.5 MG/0.5 ML INJECTION SYRINGE
0.5 | INTRAVENOUS | Status: AC | PRN
Start: 2024-03-10 — End: ?

## 2024-03-10 MED ORDER — HYDROMORPHONE 1 MG/ML INJECTION SYRINGE
1 | INTRAVENOUS | Status: CP | PRN
Start: 2024-03-10 — End: ?
  Administered 2024-03-10 (×4): 1 mL via INTRAVENOUS

## 2024-03-10 MED ORDER — PROCHLORPERAZINE EDISYLATE 10 MG/2 ML (5 MG/ML) INJECTION SOLUTION
10 | Freq: Four times a day (QID) | INTRAVENOUS | Status: DC | PRN
Start: 2024-03-10 — End: 2024-03-13
  Administered 2024-03-10: 05:00:00 10 mL via INTRAVENOUS

## 2024-03-10 MED ORDER — PROCHLORPERAZINE EDISYLATE 10 MG/2 ML (5 MG/ML) INJECTION SOLUTION
10 | Freq: Four times a day (QID) | INTRAMUSCULAR | Status: DC | PRN
Start: 2024-03-10 — End: 2024-03-10

## 2024-03-10 MED ORDER — PANTOPRAZOLE IV PUSH 40 MG VIAL & NS (ADULTS)
Freq: Every day | INTRAVENOUS | Status: DC
Start: 2024-03-10 — End: 2024-03-12
  Administered 2024-03-10 – 2024-03-11 (×2): 10.000 mL via INTRAVENOUS

## 2024-03-10 NOTE — Plan of Care [1000001]
 Problem: Adult Inpatient Plan of CareGoal: Plan of Care ReviewOutcome: Interventions implemented as appropriate Plan of Care Overview/ Patient StatusAlert and oriented x 4. Scheduled Tylenol  and Dilaudid  IVP given for abdominal pain with moderate relief. All  meds and POC explained, verbalize understanding. All safety precautions maintained and call bell within reach. Abdomen soft and tender. Hypoactive BS x 4. NPO and IVF infusing as ordered. NGT to [facility] draining moderate amount of thin dark brown fluid. NGT flushed frequently. Zofran  and Compazine PRN for nausea with relief. Voiding adequately. Repositioning self in bed. OOB to commode with assist x 1. Addendum 6:15am: Critical INR 5.77. Dr. Hilarie notified. No new orders given.

## 2024-03-10 NOTE — Progress Notes [1]
 SURGERY PROGRESS NOTETrauma & Emergency General SurgeryAttending Provider: Joshua Norman Mann, MDInterim History: -Admitted 11/12 for SBO- SBFT yesterday-hep gtt held overnight for INR 5.61Review of Allergies/Meds/Hx: I have reviewed the patient's: allergies, current medicationsData: Vitals:Temp:  [97.6 ?F (36.4 ?C)-98.3 ?F (36.8 ?C)] 98.1 ?F (36.7 ?C)Pulse:  [87-122] 103Resp:  [16-20] 20BP: (121-161)/(57-80) 121/57SpO2:  [93 %-99 %] 95 %Device: room air I/O's:I/O last 3 completed shifts:In: 45 [Other:45]Out: 700 [Other:700]Exam: Gen:  NADCV:  RRRResp:  non-labored breathing on RAAbd:  softer, distension improving, improved TTP in the LUQ. No guarding, rebound, or peritonitis Ext:  WWPObjective: Labs:CBCLab Results Component Value Date  WBC 12.7 (H) 03/10/2024  HGB 13.5 03/10/2024  HCT 43.10 03/10/2024  PLT 331 03/10/2024 LYTESLab Results Component Value Date  NA 133 (L) 03/10/2024  K 4.2 03/10/2024  CL 97 (L) 03/10/2024  CO2 22 03/10/2024  CREATININE 0.70 03/10/2024  BUN 14 03/10/2024  GLU 189 (H) 03/10/2024  CALCIUM  9.2 03/10/2024  MG 2.0 03/10/2024  PHOS 3.2 03/10/2024 LFTsLab Results Component Value Date  BILITOT 0.3 03/08/2024  BILIDIR <0.1 03/08/2024  AST 28 03/08/2024  ALT 24 03/08/2024  ALKPHOS 159 (H) 03/08/2024  AMYLASE 41 08/22/2014  LIPASE 25 03/08/2024 PT/INR/PTTLab Results Component Value Date  INR 5.77 (HH) 03/10/2024  PTT 69.2 (H) 03/10/2024 Assessment: Patient is 73 y.o. 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, Barcewicz) ISO lupus anticoagulant (on warfarin), colostomy takedown (01/2014), ventral hernia c/b SBO (ex lap, LOA and ventral hernia repair w mesh 07/2014, Barcewicz), repeat SBO (ex lap, LOA, enterotomy repair 11/2015, Barcewicz), ventral hernia repair w mesh Sherrie 01/2020), and multiple admissions for SBOs last being 02/03/24-02/06/2024 presenting w/ SBO currently being managed non-operatively.Plan: - SBFT xray for 2pm this afternoon- Pain control- Bowel regimen- Diet: NPO- Monitor UOP and I/Os- Home meds have been reviewed- DVT prophylaxis: heparin  gtt- IS hourly while awake and encourage ambulationSigned:Emery Pilot, MDCardiothoracic Surgery Resident11/14/2025 7:49 AMFor questions: EPIC Secure ChatAttending Addendum:

## 2024-03-10 NOTE — Plan of Care [1000001]
 Problem: Adult Inpatient Plan of CareGoal: Plan of Care ReviewOutcome: Interventions implemented as appropriateFlowsheets (Taken 03/10/2024 1405)Plan of Care Reviewed With: patient spouse Plan of Care Overview/ Patient StatusPatient teaching provided regarding activity level, medication regime and NPO status. Patients NG requiring frequent irrigation to maintain patency, draining thick drainage. Patient OOB ambulating in hall way, increase activity encouraged. Adequate urine output , IV fluids maintained. Patient with intermittent abdominal pain managed with IV dilaudid . Patient voicing frustration with repeated hospitalizations, husband at bedside and treatment plan reviewed. Ronal Guardian, RN

## 2024-03-11 ENCOUNTER — Inpatient Hospital Stay: Admit: 2024-03-11 | Payer: Medicare (Managed Care)

## 2024-03-11 LAB — BASIC METABOLIC PANEL
BKR ANION GAP: 12 (ref 7–17)
BKR BLOOD UREA NITROGEN: 13 mg/dL (ref 8–23)
BKR BUN / CREAT RATIO: 18.6 (ref 8.0–23.0)
BKR CALCIUM: 8.8 mg/dL (ref 8.8–10.2)
BKR CHLORIDE: 101 mmol/L (ref 98–107)
BKR CO2: 25 mmol/L (ref 20–30)
BKR CREATININE DELTA: 0
BKR CREATININE: 0.7 mg/dL (ref 0.40–1.30)
BKR EGFR, CREATININE (CKD-EPI 2021): >60 mL/min/1.73m2 (ref >=60)
BKR GLUCOSE: 135 mg/dL — ABNORMAL HIGH (ref 70–100)
BKR POTASSIUM: 3.9 mmol/L (ref 3.3–5.3)
BKR SODIUM: 138 mmol/L (ref 136–144)

## 2024-03-11 LAB — CBC WITH AUTO DIFFERENTIAL
BKR WAM ABSOLUTE IMMATURE GRANULOCYTES.: 0.02 x 1000/ÂµL (ref 0.00–0.30)
BKR WAM ABSOLUTE LYMPHOCYTE COUNT.: 1.7 x 1000/ÂµL (ref 0.60–3.70)
BKR WAM ABSOLUTE NRBC: 0 x 1000/ÂµL (ref 0.00–1.00)
BKR WAM ANC (ABSOLUTE NEUTROPHIL COUNT): 6.81 x 1000/ÂµL (ref 2.00–7.60)
BKR WAM BASOPHIL ABSOLUTE COUNT.: 0.02 x 1000/ÂµL (ref 0.00–1.00)
BKR WAM BASOPHILS: 0.2 % (ref 0.0–1.4)
BKR WAM EOSINOPHIL ABSOLUTE COUNT.: 0.11 x 1000/ÂµL (ref 0.00–1.00)
BKR WAM EOSINOPHILS: 1.2 % (ref 0.0–5.0)
BKR WAM HEMATOCRIT: 38.6 % (ref 35.00–45.00)
BKR WAM HEMOGLOBIN: 12.1 g/dL (ref 11.7–15.5)
BKR WAM IMMATURE GRANULOCYTES: 0.2 % (ref 0.0–1.0)
BKR WAM LYMPHOCYTES: 17.8 % (ref 17.0–50.0)
BKR WAM MCH: 26 pg — ABNORMAL LOW (ref 27.0–33.0)
BKR WAM MCHC: 31.3 g/dL (ref 31.0–36.0)
BKR WAM MCV: 82.8 fL (ref 80.0–100.0)
BKR WAM MONOCYTE ABSOLUTE COUNT.: 0.89 x 1000/ÂµL (ref 0.00–1.00)
BKR WAM MONOCYTES: 9.3 % (ref 4.0–12.0)
BKR WAM MPV: 10.8 fL (ref 8.0–12.0)
BKR WAM NEUTROPHILS: 71.3 % (ref 39.0–72.0)
BKR WAM NUCLEATED RED BLOOD CELLS: 0 % (ref 0.0–1.0)
BKR WAM PLATELETS: 320 x1000/ÂµL (ref 150–420)
BKR WAM RDW-CV: 16.6 % — ABNORMAL HIGH (ref 11.0–15.0)
BKR WAM RED BLOOD CELL COUNT.: 4.66 M/ÂµL (ref 4.00–6.00)
BKR WAM WHITE BLOOD CELL COUNT: 9.6 x1000/ÂµL (ref 4.0–11.0)

## 2024-03-11 LAB — PROTIME AND INR
BKR INR: 4.51 — ABNORMAL HIGH (ref 0.90–1.10)
BKR PROTHROMBIN TIME: 43.3 s — ABNORMAL HIGH (ref 9.0–12.0)

## 2024-03-11 LAB — PHOSPHORUS     (BH GH L LMW YH): BKR PHOSPHORUS: 2.1 mg/dL — ABNORMAL LOW (ref 2.2–4.5)

## 2024-03-11 LAB — MAGNESIUM: BKR MAGNESIUM: 2 mg/dL (ref 1.7–2.4)

## 2024-03-11 MED ORDER — LOSARTAN 25 MG TABLET
25 | Freq: Every day | ORAL | Status: DC
Start: 2024-03-11 — End: 2024-03-13
  Administered 2024-03-11 – 2024-03-12 (×2): 25 mg via ORAL

## 2024-03-11 MED ORDER — HYDROMORPHONE 1 MG/ML INJECTION SYRINGE
1 | INTRAVENOUS | 1 refills | Status: CP | PRN
Start: 2024-03-11 — End: ?
  Administered 2024-03-11 (×2): 1 mL via INTRAVENOUS

## 2024-03-11 MED ORDER — HYDROMORPHONE 0.5 MG/0.5 ML INJECTION SYRINGE
0.5 | INTRAVENOUS | 1 refills | Status: AC | PRN
Start: 2024-03-11 — End: ?

## 2024-03-11 NOTE — Plan of Care [1000001]
 Problem: Adult Inpatient Plan of CareGoal: Plan of Care ReviewOutcome: Interventions implemented as appropriate Plan of Care Overview/ Patient StatusPt is alert and oriented x 4. On RA, denies SOB. Pain controlled w/ prn Dilaudid . NGT LWCS in place, flushed , puts out dark brown output. Had large size loose BM. Zofran  given for N/V + result. Abdomen distended. All meds  given per [facility]. Call bell within reach. Safety maintained.

## 2024-03-11 NOTE — Plan of Care [1000001]
 Problem: Adult Inpatient Plan of CareGoal: Plan of Care ReviewOutcome: Interventions implemented as appropriateFlowsheets (Taken 03/10/2024 1405)Plan of Care Reviewed With: patient spouse Plan of Care Overview/ Patient StatusPatient teaching provided regarding activity level, medication regime and diet advancement. Patient OOB ambulating around nurses station, increase activity encouraged, well tolerated. Patients NG tube removed this AM, taking small amounts of clear liquids without GI distress. Patient with adequate urine output. Reinforcement of teaching provided and questions encouraged. Ronal Guardian, RN

## 2024-03-11 NOTE — Progress Notes [1]
 General Surgery Progress NoteInterim Events:- reports BM x 1 this AmS:- denies nausea / emesis- pain well controlled on medicationsPhysical Exam:Temp:  [97.5 ?F (36.4 ?C)-98.7 ?F (37.1 ?C)] 97.9 ?F (36.6 ?C)Pulse:  [85-114] 90Resp:  [14-20] 14BP: (117-186)/(69-90) 183/78SpO2:  [94 %-99 %] 99 %I/O last 3 completed shifts:In: 3942 [I.V.:3293.3; Other:60; IV Piggyback:588.6]Out: 1150 [Urine:400; Other:750]General: awake,lying comfortably in bedNeuro: talkative, moving all 4 extremities spontaneously Pulm: no wheezing/stridor, no abnormal breath sounds, breathing comfortably on RAHEENT: NGT in place to LCWSCardio: RRRABD: soft, appropriate pain upon palpation in all 4 quadrants; no distension; ex lap incisions well healedExt: warm, well perfusedCBCLab Results Component Value Date  WBC 9.6 03/11/2024  HGB 12.1 03/11/2024  HCT 38.60 03/11/2024  PLT 320 03/11/2024 LYTESLab Results Component Value Date  NA 138 03/11/2024  K 3.9 03/11/2024  CL 101 03/11/2024  CO2 25 03/11/2024  CREATININE 0.70 03/11/2024  BUN 13 03/11/2024  GLU 135 (H) 03/11/2024  CALCIUM  8.8 03/11/2024  MG 2.0 03/11/2024  PHOS 2.1 (L) 03/11/2024 LFTsLab Results Component Value Date  BILITOT 0.3 03/08/2024  BILIDIR <0.1 03/08/2024  AST 28 03/08/2024  ALT 24 03/08/2024  ALKPHOS 159 (H) 03/08/2024  AMYLASE 41 08/22/2014  LIPASE 25 03/08/2024 PT/INR/PTTLab Results Component Value Date  INR 5.77 (HH) 03/10/2024  PTT 69.2 (H) 03/10/2024 Microbiology:Lab Results Component Value Date  LABBLOO  06/06/2013   No growth after 5 days of incubation.All Blood Culture Bottles with growth will be reported  LABBLOO  05/29/2013   No growth after 5 days of incubation.All Blood Culture Bottles with growth will be reported  LABBLOO  05/29/2013   No growth after 5 days of incubation.All Blood Culture Bottles with growth will be reported  LABURIN 10,000-49,000 CFU/mL Lactobacillus species (A) 03/05/2021  LABURIN  06/16/2016   Less than 10,000 CFU/mL. Clinical significance is unlikely for organism(s) present in quantities of less than 10,000 CFU/mL.  LABURIN Less than 10,000 cfu/mL 06/04/2013  LABURIN No Growth 05/29/2013  LABURIN No Growth 05/27/2013  LOWERRESPIRA 2+ Normal Flora 05/29/2013 A/P:- follow up AM KUB- possible dc NGT / initation of CLD sips this afternoonEmily June Auriella Wieand, MS, MDGeneral Surgery PGY511/15/2025

## 2024-03-12 ENCOUNTER — Encounter
Admit: 2024-03-12 | Payer: PRIVATE HEALTH INSURANCE | Attending: Vascular and Interventional Radiology | Primary: Internal Medicine

## 2024-03-12 DIAGNOSIS — E785 Hyperlipidemia, unspecified: Secondary | ICD-10-CM

## 2024-03-12 DIAGNOSIS — E119 Type 2 diabetes mellitus without complications: Secondary | ICD-10-CM

## 2024-03-12 DIAGNOSIS — I10 Essential (primary) hypertension: Secondary | ICD-10-CM

## 2024-03-12 DIAGNOSIS — K56609 Unspecified intestinal obstruction, unspecified as to partial versus complete obstruction: Principal | ICD-10-CM

## 2024-03-12 DIAGNOSIS — D6862 Lupus anticoagulant syndrome: Secondary | ICD-10-CM

## 2024-03-12 DIAGNOSIS — Z7984 Long term (current) use of oral hypoglycemic drugs: Secondary | ICD-10-CM

## 2024-03-12 DIAGNOSIS — Z794 Long term (current) use of insulin: Secondary | ICD-10-CM

## 2024-03-12 DIAGNOSIS — Z79899 Other long term (current) drug therapy: Secondary | ICD-10-CM

## 2024-03-12 LAB — BASIC METABOLIC PANEL
BKR ANION GAP: 10 (ref 7–17)
BKR BLOOD UREA NITROGEN: 9 mg/dL (ref 8–23)
BKR BUN / CREAT RATIO: 12.9 (ref 8.0–23.0)
BKR CALCIUM: 9 mg/dL (ref 8.8–10.2)
BKR CHLORIDE: 102 mmol/L (ref 98–107)
BKR CO2: 24 mmol/L (ref 20–30)
BKR CREATININE DELTA: 0
BKR CREATININE: 0.7 mg/dL (ref 0.40–1.30)
BKR EGFR, CREATININE (CKD-EPI 2021): >60 mL/min/1.73m2 (ref >=60)
BKR GLUCOSE: 180 mg/dL — ABNORMAL HIGH (ref 70–100)
BKR POTASSIUM: 3.8 mmol/L (ref 3.3–5.3)
BKR SODIUM: 136 mmol/L (ref 136–144)

## 2024-03-12 LAB — CBC WITH AUTO DIFFERENTIAL
BKR WAM ABSOLUTE IMMATURE GRANULOCYTES.: 0.03 x 1000/ÂµL (ref 0.00–0.30)
BKR WAM ABSOLUTE LYMPHOCYTE COUNT.: 1.04 x 1000/ÂµL (ref 0.60–3.70)
BKR WAM ABSOLUTE NRBC: 0 x 1000/ÂµL (ref 0.00–1.00)
BKR WAM ANC (ABSOLUTE NEUTROPHIL COUNT): 6.43 x 1000/ÂµL (ref 2.00–7.60)
BKR WAM BASOPHIL ABSOLUTE COUNT.: 0.03 x 1000/ÂµL (ref 0.00–1.00)
BKR WAM BASOPHILS: 0.4 % (ref 0.0–1.4)
BKR WAM EOSINOPHIL ABSOLUTE COUNT.: 0.12 x 1000/ÂµL (ref 0.00–1.00)
BKR WAM EOSINOPHILS: 1.4 % (ref 0.0–5.0)
BKR WAM HEMATOCRIT: 39.4 % (ref 35.00–45.00)
BKR WAM HEMOGLOBIN: 12.4 g/dL (ref 11.7–15.5)
BKR WAM IMMATURE GRANULOCYTES: 0.4 % (ref 0.0–1.0)
BKR WAM LYMPHOCYTES: 12.5 % — ABNORMAL LOW (ref 17.0–50.0)
BKR WAM MCH: 25.8 pg — ABNORMAL LOW (ref 27.0–33.0)
BKR WAM MCHC: 31.5 g/dL (ref 31.0–36.0)
BKR WAM MCV: 81.9 fL (ref 80.0–100.0)
BKR WAM MONOCYTE ABSOLUTE COUNT.: 0.65 x 1000/ÂµL (ref 0.00–1.00)
BKR WAM MONOCYTES: 7.8 % (ref 4.0–12.0)
BKR WAM MPV: 11 fL (ref 8.0–12.0)
BKR WAM NEUTROPHILS: 77.5 % — ABNORMAL HIGH (ref 39.0–72.0)
BKR WAM NUCLEATED RED BLOOD CELLS: 0 % (ref 0.0–1.0)
BKR WAM PLATELETS: 333 x1000/ÂµL (ref 150–420)
BKR WAM RDW-CV: 16.1 % — ABNORMAL HIGH (ref 11.0–15.0)
BKR WAM RED BLOOD CELL COUNT.: 4.81 M/ÂµL (ref 4.00–6.00)
BKR WAM WHITE BLOOD CELL COUNT: 8.3 x1000/ÂµL (ref 4.0–11.0)

## 2024-03-12 LAB — PHOSPHORUS     (BH GH L LMW YH): BKR PHOSPHORUS: 2.1 mg/dL — ABNORMAL LOW (ref 2.2–4.5)

## 2024-03-12 LAB — PROTIME AND INR
BKR INR: 3.14 — ABNORMAL HIGH (ref 0.90–1.10)
BKR PROTHROMBIN TIME: 30.9 s — ABNORMAL HIGH (ref 9.0–12.0)

## 2024-03-12 LAB — MAGNESIUM: BKR MAGNESIUM: 2.1 mg/dL (ref 1.7–2.4)

## 2024-03-12 MED ORDER — PANTOPRAZOLE 40 MG TABLET,DELAYED RELEASE
40 | Freq: Every day | ORAL | Status: DC
Start: 2024-03-12 — End: 2024-03-13
  Administered 2024-03-12: 09:00:00 40 mg via ORAL

## 2024-03-12 MED ORDER — POLYETHYLENE GLYCOL 3350 17 GRAM ORAL POWDER PACKET
17 | Freq: Every day | ORAL | 3 refills | 14.00000 days | Status: AC
Start: 2024-03-12 — End: ?

## 2024-03-12 MED ORDER — ACETAMINOPHEN 500 MG TABLET
500 | ORAL_TABLET | Freq: Four times a day (QID) | ORAL | 1 refills | 4.00000 days | Status: AC | PRN
Start: 2024-03-12 — End: ?

## 2024-03-12 NOTE — Plan of Care [1000001]
 1900-0700Plan of Care Overview/ Patient StatusPatient received alert and oriented x 4. No complaints of pain or discomfort. Nausea  decreased. Was able to tolerate broth and oral fluids with no vomiting. OOB ad lib. Continues on IVF @ 87mL/hr. Finger stick monitored. Insulin  coverage administered as per order. Requested melatonin for sleep. Same given with good relief. Safety precautions in place. B/p continues to be elevated. Asymptomatic. MD Duah made aware. Daily losartan  dose given early.

## 2024-03-12 NOTE — Plan of Care [1000001]
 Problem: Adult Inpatient Plan of CareGoal: Plan of Care ReviewOutcome: Outcome(s) achieved Plan of Care Overview/ Patient StatusPatient cleared for discharge after tolerating breakfast and lunch. Patient with positive flatus and had a BM today. Patient ambulating throughout the day. Patient provided with written and verbal DC instructions. Ronal Guardian, RN

## 2024-03-12 NOTE — Plan of Care [1000001]
 Plan of Care Overview/ Patient StatusProblem: Adult Inpatient Plan of CareGoal: Plan of Care ReviewOutcome: Outcome(s) achievedCase Management Plan  Flowsheet Row Most Recent Value Discharge Planning  Patient/Patient Representative goals/treatment preferences for discharge are:  home Patient/Patient Representative was presented with a list of facilities, agencies and/or dme providers and Referral(s) placed for: None Mode of Transportation  Private car  (add comment for special considerations) Patient accompanied by spouse Discharge Readiness  PASRR completed and approved N/A Authorization number obtained, if required N/A Is there a 3 day INPATIENT Qualifying stay for Medicare Patients? N/A Medicare IM- signed, dated, timed and scanned, if required Yes DME Authorized/Delivered N/A No needs identified/ follow up with PCP/MD/Outpatient Provider Yes Post acute care services secured W10 complete N/A PRI Completed and Accepted ([place]  Patients Only) N/A Is the destination address correct on the W10 N/A Is MVP Pathway complete? Yes Last Bowel Movement 03/11/24 Finalized Plan  Expected Discharge Date 03/12/24 Discharge Disposition Home or Self Care Anticipate discharge today 03/12/2024 to home with HCS. Patient is in agreement with the POC. Spouse to transport.  IM obtained.Jonette Forest RN [facility] Case ManagerYale [facility]cy [facility]

## 2024-03-12 NOTE — Progress Notes [1]
 General Surgery Progress NoteInterim Events:- NGT dc'ed, advanced to CLDS:- tolerating CLD w/o nausea / emesis- no report of abd pain this AM, controlled on medicationsPhysical Exam:Temp:  [97.5 ?F (36.4 ?C)-98.4 ?F (36.9 ?C)] 98.3 ?F (36.8 ?C)Pulse:  [82-99] 96Resp:  [15-18] 16BP: (104-188)/(69-106) 104/76SpO2:  [98 %-100 %] 100 %I/O last 3 completed shifts:In: 1976.1 [P.O.:480; I.V.:1496.1]Out: 400 [Urine:400]General: awake,lying comfortably in bedNeuro: talkative, moving all 4 extremities spontaneously Pulm: no wheezing/stridor, no abnormal breath sounds, breathing comfortably on RACardio: RRRABD: soft, appropriate pain upon palpation in all 4 quadrants; no distension; ex lap incision well healed (prior hospitlization)Ext: warm, well perfusedSkin: no rashes notedCBCLab Results Component Value Date  WBC 8.3 03/12/2024  HGB 12.4 03/12/2024  HCT 39.40 03/12/2024  PLT 333 03/12/2024 LYTESLab Results Component Value Date  NA 136 03/12/2024  K 3.8 03/12/2024  CL 102 03/12/2024  CO2 24 03/12/2024  CREATININE 0.70 03/12/2024  BUN 9 03/12/2024  GLU 144 (H) 03/12/2024  CALCIUM  9.0 03/12/2024  MG 2.1 03/12/2024  PHOS 2.1 (L) 03/12/2024 LFTsLab Results Component Value Date  BILITOT 0.3 03/08/2024  BILIDIR <0.1 03/08/2024  AST 28 03/08/2024  ALT 24 03/08/2024  ALKPHOS 159 (H) 03/08/2024  AMYLASE 41 08/22/2014  LIPASE 25 03/08/2024 PT/INR/PTTLab Results Component Value Date  INR 3.14 (H) 03/12/2024  PTT 69.2 (H) 03/10/2024 Microbiology:Lab Results Component Value Date  LABBLOO  06/06/2013   No growth after 5 days of incubation.All Blood Culture Bottles with growth will be reported  LABBLOO  05/29/2013   No growth after 5 days of incubation.All Blood Culture Bottles with growth will be reported  LABBLOO  05/29/2013   No growth after 5 days of incubation.All Blood Culture Bottles with growth will be reported  LABURIN 10,000-49,000 CFU/mL Lactobacillus species (A) 03/05/2021  LABURIN  06/16/2016   Less than 10,000 CFU/mL. Clinical significance is unlikely for organism(s) present in quantities of less than 10,000 CFU/mL.  LABURIN Less than 10,000 cfu/mL 06/04/2013  LABURIN No Growth 05/29/2013  LABURIN No Growth 05/27/2013  LOWERRESPIRA 2+ Normal Flora 05/29/2013 A/P:- advance to regular diet this AM- possible dc today vs Monday 11/17Emily June [place], [place], MDGeneral Surgery PGY511/16/2025

## 2024-03-12 NOTE — Discharge Summary [5]
 [facility]	Med/Surg Discharge SummaryPatient Data:  Patient Name: Maria [name] Admit date: 03/08/2024 Age: 73 y.o. Discharge date: 03/12/2024 DOB: 12/10/1950	 Discharge Attending Physician: Joshua Norman Mann, MD  MRN: [id number]	 PCP: Festus Oliva BIRCH, MD Principal Diagnosis: SBO (small bowel obstruction) (HC Code)  [facility] CODE)Other Diagnosis:  Hospital Course: Maria [name] is a 75 YOF IDDM2, HTN, HLD, +Lupus Anticoagulant (on Warfari), multiple prior abdominal surgeries including hernia repairs and ex lap with L hemicolectomy for ischemic colitis c/b multiple SBOs and recent prior admission for SBO 10/9-10/12 who presented to [facility] ED on 11/12 with nausea, vomiting and unrelenting abdominal pain. In the ED she was found to be afebrile and tachycardic to 104, she was also hypertensive to 187/78. She had a WBC of 15.2 with a left shift without any additional lab abnormalites. A [place] abdomen and pelvis confirmed a high grade SBO with a transition point near the midline. She was then admitted to EGS for further management. It was decided to manage her presentation, non-operatively. She was started on fluids and heparin . She was made NPO and an NGT was placed. On 11/13 she had another bout of emesis and nausea and HTN to 202/88 she received dilaudid  and her NGT was flushed, during the course of that morning her NGT was displaced and appropriately advanced and replaced. On 11/14 A small bowel follow through was performed which showed lack of passage of contrast into the colon. Her heparin  was also held due to supratherapeutic INR. On 11/15 she had a bowel movement and her NGT was removed. She was started on clears which she tolerated appropriately. She was ultimately advanced to a regular diet which she tolerated appropriately.On 11/16 due to resolution of SBO symptoms, nausea and vomiting and return of bowel function she was deemed safe to return home. She will follow up with the EGS team in two weeks. Cognitive Status at Discharge: Alert and oriented x 3ISSUES TO BE ADDRESSED POST DISCHARGE: Follow up with Dr. Norman Mann Joshua - Emergency General Surgeon for SBO follow up in two weeks (please call the number listed at the bottom of your discharge summary ASAP to schedule an appointment)Discharged Condition: goodDisposition: Home Allergies Allergies[1] Discharge vitals: Blood pressure (!) 141/77, pulse (!) 104, temperature 97.7 ?F (36.5 ?C), temperature source Oral, resp. rate 18, height 4' 10 (1.473 m), weight 76 kg, SpO2 100%, not currently breastfeeding.Discharge Physical Exam and Pertinent Findings of Physical Exam  General: awake,lying comfortably in bedNeuro: talkative, moving all 4 extremities spontaneously Pulm: no wheezing/stridor, no abnormal breath sounds, breathing comfortably on RACardio: RRRABD: soft, appropriate pain upon palpation in all 4 quadrants; no distension; ex lap incision well healed (prior hospitlization)Ext: warm, well perfusedSkin: no rashes Pending Labs and Tests: Pending Lab Results   Order Current Status  UA reflex to culture Collected (03/08/24 2117)  Urinalysis with culture reflex     (BH LMW YH) Collected (03/08/24 2117)  Urinalysis with culture reflex (urinary symptoms) Collected (03/08/24 2117)  Urinalysis-macroscopic w/reflex microscopic Collected (03/08/24 2117)  Discharge Medications: Current Discharge Medication List  START taking these medications  Details acetaminophen  (TYLENOL ) 500 mg tablet Take 2 tablets (1,000 mg total) by mouth every 6 (six) hours as needed for pain.Qty: 120 tablet, Refills: 0Start date: 03/12/2024, End date: 03/27/2024  polyethylene glycol (MIRALAX ) 17 gram packet Take 1 packet (17 g total) by mouth daily. Mix in 8 ounces of water , juice, soda, coffee or tea prior to taking.Qty: 14 each, Refills: 2Start date: 03/12/2024   CONTINUE these  medications which have NOT CHANGED  Details biotin 1 mg tablet Take 1 tablet (1,000 mcg total) by mouth daily.  cholecalciferol, vitamin D3, 125 mcg (5,000 unit) tablet Take 1 tablet (5,000 Units total) by mouth every other day.  diphenhydrAMINE  (BENADRYL ) 25 mg tablet Take 1 tablet (25 mg total) by mouth as needed.  gabapentin  (NEURONTIN ) 300 mg capsule Take 1 capsule (300 mg total) by mouth nightly.  insulin  glargine (SEMGLEE ,LANTUS ,BASAGLAR ) 100 unit/mL (3 mL) pen Inject 10 Units under the skin nightly.  insulin  lispro (HUMALOG  KWIKPEN INSULIN ) 100 unit/mL pen Inject 2 units with smaller carb meals and 4 units with larger carb mealsQty: 15 mL, Refills: 2  JARDIANCE  25 mg tablet Take 1 tablet (25 mg total) by mouth daily.  losartan  (COZAAR ) 25 mg tablet Take 1 tablet (25 mg total) by mouth daily.  meclizine (ANTIVERT) 12.5 mg tablet Take 1 tablet (12.5 mg total) by mouth daily as needed for DizzinessQty: 30 tablet, Refills: 1  Associated Diagnoses: Vertigo  melatonin 10 mg Tab Take 10 mg by mouth nightly as needed.   multivitamin tablet Take 1 tablet by mouth daily.  ondansetron  (ZOFRAN ) 4 MG tablet Take 1 tablet (4 mg total) by mouth every 8 (eight) hours as needed for nausea.  phytonadione, vit K1, (VITAMIN K) 100 mcg tablet Take 1 tablet (100 mcg total) by mouth daily.  pravastatin  (PRAVACHOL ) 80 mg tablet Take 1 tablet (80 mg total) by mouth nightly.  senna (SENOKOT) 8.6 mg tablet Take 2 tablets by mouth nightly.Mylo: 30 tablet, Refills: 11  Associated Diagnoses: SBO (small bowel obstruction) (HC Code)  (HC CODE)  SITagliptin phosphate (JANUVIA) 100 mg tablet Take 1 tablet (100 mg total) by mouth daily.Qty: 90 tablet, Refills: 3  venlafaxine  (EFFEXOR  XR) 150 mg XR 24 hr extended release capsule Take 1 capsule (150 mg total) by mouth daily.  warfarin (COUMADIN ) 5 mg tablet Take 1.5 tablets (7.5 mg total) by mouth Daily @1800 .  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 ContourQty: 1 each, Refills: 0  blood sugar diagnostic (CONTOUR TEST STRIPS) test strips Use to check blood sugar 3 times per day (Type 2 Diabetes: E11.9 on insulin ). Accu-chekQty: 300 each, Refills: 11  blood-glucose sensor (FREESTYLE LIBRE 3 PLUS SENSOR) device Apply a new sensor every 15 days E11.9 on insulinQty: 6 each, Refills: 3  FREESTYLE LIBRE 3 READER Misc Use as directed.Qty: 1 each, Refills: 0  insulin  pen needle (NANO 2ND GEN PEN NEEDLE) 32 gauge x 5/32 USE 1 WITH INSULIN  PEN UP TO 4 TIMES DAILY. NEED FOLLOW-UP APPOINTMENT.Qty: 100 each, Refills: 3  Associated Diagnoses: Type 2 diabetes mellitus without complication, without long-term current use of insulin   (HC CODE)  lancets (FINGERSTIX LANCETS) Use to check blood sugar 3 times per day (Type 2 Diabetes: E11.9 on insulin ). Accu-chekQty: 300 each, Refills: 11   Yes the patient is being discharged on Warfarin. These are the most recent dosings and INRs  Recent Warfarin Dosing  More data exists   Latest Ref Rng & Units 03/09/2024 03/10/2024 03/11/2024 03/12/2024 Recent Warfarin Dosing INR 0.90 - 1.10 5.61  6.60  5.77  4.51  3.14   Details    Multiple values from one day are sorted in reverse-chronological order   Pertinent lab findings and test results: XR Abdomen APResult Date: 11/14/2025Contrast opacifying the small bowel similar to x-ray abdomen from earlier today, contrast is not seen in the large bowel. Unchanged bowel distention. Gastric  tube overlying the region of the body of the stomach. [facility] Radiology Notify System Classification: Routine. Report initiated by:  Beulah LULLA Homme, MD Reported and signed by: Ballard Bars, MD  [facility] Radiology and Biomedical Imaging XR Abdomen APResult Date: 03/10/2024 Compared to prior study yesterday, nasogastric tube tip and side port remain in the stomach. Oral contrast continues to opacify the stomach and multiple, similarly dilated loops of bowel. Oral contrast remains similarly distributed throughout small bowel loops, with no evidence of passage into large bowel. Free gas cannot be excluded on a supine only radiograph. If there is concern for free gas, recommend upright frontal view or left lateral decubitus view abdomen radiograph. [facility] Radiology Notify Critical Result system classification: Routine. Reported and signed by: Ballard Bars, MD  [facility] Radiology and Biomedical Imaging XR Abdomen AP w oral contrast (Eval for SBO)Result Date: 11/14/2025Enteric tube coursing through the esophagus into the distal body of stomach. Oral contrast opacifying the stomach and multiple dilated loops of bowel. The contrast passage is appreciated up to the level of mid to distal small bowel. No contrast in the colon. Fecal matter is seen in the right hemicolon. Excreted contrast in the urinary bladder. Limited evaluation for free air on supine radiographs. Reported and signed by: Genell Bowler, MD  [facility] Radiology and Biomedical Imaging XR Chest PA or APResult Date: 03/09/2024 No change since prior study.  Reported and signed by: Deward Llamas, MD  [facility] Radiology and Biomedical Imaging XR Chest PA or AP (Portable)Result Date: 03/09/2024 No radiographic evidence for airspace opacity or acute radiographic change. Reported and signed by: Deward Llamas, MD  [facility] Radiology and Biomedical Imaging XR Chest PA or APResult Date: 03/09/2024 1. Enteric tube is appropriately located below left hemidiaphragm. [facility] Radiology Notify System Classification: Routine. Reported and signed by: Fairy Comp, MD  [facility] Radiology and Biomedical Imaging [place] Abdomen Pelvis with IV Contrast without oral (BMI>25)Result Date: 11/12/2025Acute small bowel obstruction as described. [facility] Radiology Notify System Classification: Urgent result. Report initiated by:  Carliss Dallas Merles, MD Reported and signed by: Virgene Deters, MD  [facility] Radiology and Biomedical Imaging  Pertinent Procedures NGT placement  Follow-up Information:Jones, Norman Mann, MD[address] [facility] [place] [phone] call Dr. Norman Molt' office ASAP to schedule an appointment in two weeks for a follow up for your small bowel obstruction. PMH PSH Past Medical History[2] Past Surgical History[3] Social History Family History Social History Tobacco Use  Smoking status: Never  Smokeless tobacco: Never Substance Use Topics  Alcohol use: Not Currently   Alcohol/week: 1.0 standard drink of alcohol   Types: 1 Glasses of wine per week   Comment: 1-2/year  Family History Problem Relation Age of Onset  Bladder cancer Mother   Colon cancer Father 13  Diabetes Father   Diabetes Sister   Colon cancer Sister 25  Crohn's disease Sister 35  Colon cancer Brother 62  Colon cancer Paternal Aunt 68  Diabetes Paternal Uncle   Breast cancer Maternal Cousin       2 cousins with breast cancer  Colon cancer Paternal Cousin 81  Renda A Duah11/16/2025 3:11 PMElectronically Signed:Dinari Stgermaine DELENA Pagoda, MD 03/12/2024 3:11 PMBest Contact Information: Epic Secure Chat Addendum : NONE [1] AllergiesAllergen Reactions  Morphine  Hives and Itching   Pt tolerated morphine  during admission 08/08/16-08/09/16.  Pt premedicated with diphenhydramine   Biaxin  [Clarithromycin]   Latex, Natural Rubber Rash  Mitosol [Mitomycin] Rash  Oxycodone  Rash [2] Past Medical History:Diagnosis Date  Acute ischemic colitis (HC Code) 2015  Depression  Diabetes mellitus (HC CODE)   Diabetes mellitus, type II (HC CODE)   Hypercholesteremia   Hypertension   Lupus anticoagulant disorder (HC Code)   Rosacea   Ventral hernia with bowel obstruction   Vertigo  [3] Past Surgical History:Procedure Laterality Date  ABDOMINAL ADHESION SURGERY  2016  BLEPHAROPLASTY Bilateral 05/2016  COLOSTOMY  05/27/13  COLOSTOMY CLOSURE    HYSTEROSCOPY W/ POLYPECTOMY  06/18/2020  KNEE LIGAMENT RECONSTRUCTION Right 2004  LAPAROSCOPIC NISSEN FUNDOPLICATION    2000 ?  SINUS SURGERY    THYROID BIOPSY    TUBAL LIGATION    UMBILICAL HERNIA REPAIR    VENTRAL HERNIA REPAIR

## 2024-03-13 ENCOUNTER — Ambulatory Visit: Admit: 2024-03-13 | Payer: PRIVATE HEALTH INSURANCE | Primary: Internal Medicine

## 2024-03-13 ENCOUNTER — Encounter: Admit: 2024-03-13 | Payer: PRIVATE HEALTH INSURANCE | Primary: Internal Medicine

## 2024-03-13 DIAGNOSIS — K559 Vascular disorder of intestine, unspecified: Secondary | ICD-10-CM

## 2024-03-13 DIAGNOSIS — D6862 Lupus anticoagulant syndrome: Principal | ICD-10-CM

## 2024-03-13 NOTE — Progress Notes [1]
 Pharmacist Anticoagulation Clinic Telephone Visit Follow Maria [name] (1951/02/03) is on chronic anticoagulation for the diagnosis of  Lupus anticoagulant with hypercoagulable state (HC Code)  (primary encounter diagnosis) Ischemic colitis (HC Code) with an INR goal of 2.0-3.0.  The patient was contacted regarding lab drawn INR results.  Patient's INR findings and prescribed dose from last visit:  02/10/2024   2:44 PM 03/03/2024   4:36 PM 03/13/2024  11:52 AM Anticoagulation Monitoring Assoc. INR Date 02/10/2024 03/03/2024 03/12/2024 Associated INR 2.74 1.70 3.14 Instructions 10/16: Hold; Otherwise 7.5 mg every day 11/7: 10 mg; 11/11: 10 mg; Otherwise 10 mg every Tue, Fri; 7.5 mg all other days 11/18: 7.5 mg; Otherwise 10 mg every Tue, Fri; 7.5 mg all other days Sunday Dose 7.5 mg 7.5 mg 7.5 mg Monday Dose 7.5 mg 7.5 mg 7.5 mg Tuesday Dose 7.5 mg 10 mg (11/11) 7.5 mg (11/18) Wednesday Dose 7.5 mg 7.5 mg 7.5 mg Thursday Dose Hold (10/16); Otherwise 7.5 mg 7.5 mg 7.5 mg Friday Dose 7.5 mg 10 mg (11/7) 10 mg Saturday Dose 7.5 mg 7.5 mg 7.5 mg Last 7 day dose total 22.5 mg 52.5 mg 57.5 mg Next INR Date 02/24/2024 03/10/2024  The following findings were reported from today's call (no findings if none selected):[]  Missed doses []  Extra doses []  Change in medications []  Change in vitamin k rich food intake (green vegetables, herbals, etc) []  Change in alcohol use []  Change in overall health [x]  Recent hospitalization / emergency department visit []  Upcoming procedure (including dental procedures) []  Other Comments:       Patient was admitted for SBO The following side effects were reported from today's call (no side effects if none selected):[]  Bleeding []  New bruising []  Warfarin-emergency department visit or hospital admission []  Other Comments:   No side effects Assessment/Plan:Patient's INR is 3.15, which is above goal range of 2.0-3.0.  Patient was admitted for SBO and discharged. She was instructed to take warfarin 7.5 mg daily as indicated in the maintenance plan below.  Next INR assessment due 03/16/24. This information was routed to the covering provider for review.Anticoagulation Summary  As of 03/13/2024  INR goal:  2.0-3.0 TTR:  49.5% (9.5 y) INR used for dosing:  3.14 (03/12/2024) Warfarin maintenance plan:  10 mg (5 mg x 2) every Tue, Fri; 7.5 mg (5 mg x 1.5) all other days Weekly warfarin total:  57.5 mg Plan last modified:  [name], RPH (03/03/2024) Next INR check:  -- Priority:  High Priority Target end date:  Indefinite  Indications  Lupus anticoagulant with hypercoagulable state (HC Code) [D68.62]Ischemic colitis (HC Code) [K55.9]   Anticoagulation Episode Summary   INR check location:  --  Preferred lab:  --  Send INR reminders to:  [facility] INR POOL  Comments:  --  Anticoagulation Care Providers   Provider Role Specialty Phone number  Gladis Larraine Hummer, MD Responsible Hematology and Oncology [phone]  Joesph Hun Maxwell, NP Responsible Oncology [phone]  All patient's medications have been reviewed and updated as needed.  Electronically Signed by Elveria Lango, RPH, March 13, 2024 [facility]  [address] [place], [place] [address]  Phone: [phone]  Fax: [phone]

## 2024-03-14 ENCOUNTER — Encounter: Admit: 2024-03-14 | Payer: PRIVATE HEALTH INSURANCE | Primary: Internal Medicine

## 2024-03-14 NOTE — Progress Notes [1]
 [facility] Post Discharge Outreach: Transition of Care NoteRisk of Unplanned Readmission: 16.61%PERTINENT INFORMATION:   -  TOC eligible through 11/30/2025[facility] spoke with: PatientDischarging Hospital: [facility] Discharge Date:      Discharge location: HomeHOSPITALIZATION:-presented to [facility] ED on 11/12 with nausea, vomiting and unrelenting abdominal pain. -[place] abdomen and pelvis confirmed a high grade SBOCURRENT STATE:Since discharge patient reports feeling: BetterSofia said that she is doing better. Denies any abdominal pain, or any GI symptoms. She is tolerating foods and liquids. She denies any constipation. She is taking Miralax  as ordered and she is moving her bowels. Phylicia received her discharge AVS. Medication changes and follow ups reviewed. Patient praised the staff for the care provided. She said that there was one issue. She addressed her concerns with a production designer, theatre/television/film.    REVIEW OF AFTER VISIT SUMMARY DOCUMENT:Patient specific questions regarding discharge: No MEDICATION CHANGES:Validated NEW medications to take: YesValidated Changed medications to take: N/AValidated Stopped medications to NOT take: N/AIssues obtaining prescriptions: N/A FOLLOW-UP APPOINTMENTS and TRANSPORTATION:Patient aware of scheduled appointments: YesAwareness and assistance with appointments needing to be scheduled: NoTransportation concerns for follow-up appointment: NoDME and HOME HEALTH SERVICES:Durable medical equipment received: N/AContact has been made with home care agency: N/APlan established for follow up labs/tests: N/A

## 2024-03-15 NOTE — Consults [2]
 Emergency General Surgery Consult Note [facility] Day: 1 Consult attending: Dr. Griffith from: Carrol Charleston, MD  HPI  Maria [name] is a 73 y.o. 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, Barcewicz) ISO lupus anticoagulant (on warfarin), colostomy takedown (01/2014), ventral hernia c/b SBO (ex lap, LOA and ventral hernia repair w mesh 07/2014, Barcewicz), repeat SBO (ex lap, LOA, enterotomy repair 11/2015, Barcewicz), ventral hernia repair w mesh Sherrie 01/2020), and multiple admissions for SBOs last being 02/03/24-02/06/2024 presenting w/ abdominal pain, Nausea, and vomiting.The pt reports her pain started around noon and has been worsening since. She reports she has had 2 NBNB this afternoon but has not been passing flatus since. She reports persistent nausea throughout the day and several episodes of emesis. She denies fevers or chills. In the ED she has been AF and AF and tachycardic to 104 and hypertensive to 187/78. Her labs are notable for WBC 15.2 w/ left shift and electrolytes wnl. She underwent a CTAP which revealed a high-grade SBO w/ a transition point near the midling EGS was consulted for further management.  ROS  Negative except for HPI    Past Medical History Past Surgical History Past Medical History[1] Past Surgical History[2] Social History Family History Tobacco Use  Smoking status: Never  Smokeless tobacco: Never Vaping Use  Vaping status: Never Used Substance Use Topics  Alcohol use: Not Currently   Alcohol/week: 1.0 standard drink of alcohol   Types: 1 Glasses of wine per week   Comment: 1-2/year  Drug use: No  Family History Problem Relation Age of Onset  Bladder cancer Mother   Colon cancer Father 63  Diabetes Father   Diabetes Sister   Colon cancer Sister 38  Crohn's disease Sister 81  Colon cancer Brother 13  Colon cancer Paternal Aunt 46  Diabetes Paternal Uncle   Breast cancer Maternal Cousin       2 cousins with breast cancer  Colon cancer Paternal Cousin 58  Medications Allergies (Not in a hospital admission) Allergies[3]  OBJECTIVE  Vitals:Temp:  [97.8 ?F (36.6 ?C)] 97.8 ?F (36.6 ?C)Pulse:  [104] 104Resp:  [20] 20BP: (187)/(78) 187/78SpO2:  [98 %] 98 %Device: room airRisk Score: 86 (03/08/2024 10:10 PM) I&Os:No intake/output data recorded.Physical Exam:Gen:  NADCV:  RRRResp:  non-labored breathing on RAAbd:  soft, distended, tympanic, TTP in the LUQ. No guarding, rebound, or peritonitis Ext:  WWPLaboratory:CBCLab Results Component Value Date  WBC 15.3 (H) 03/08/2024  HGB 13.6 03/08/2024  HCT 47.0 03/08/2024  PLT 390 03/08/2024 ElectrolytesLab Results Component Value Date  NA 138 03/03/2024  K 5.0 03/08/2024  CL 100 03/03/2024  CO2 27 03/03/2024  CREATININE 0.9 03/08/2024  BUN 15 03/03/2024  GLU 183 (H) 03/08/2024  CALCIUM  9.5 03/03/2024  MG 1.8 02/06/2024  PHOS 2.3 02/06/2024 LFTsLab Results Component Value Date  BILITOT 0.7 03/03/2024  BILIDIR  02/03/2024    Comment:    Sample Hemolyzed.   AST 20 03/03/2024  ALT 20 03/03/2024  ALKPHOS 125 (H) 03/03/2024  AMYLASE 41 08/22/2014  LIPASE 27 02/03/2024 CoagLab Results Component Value Date  INR 1.70 (H) 03/03/2024  PTT 72.2 (H) 02/06/2024 Imaging: [place] Abdomen Pelvis with IV Contrast without oral (BMI>25)Result Date: 11/12/2025Acute small bowel obstruction as described. [facility] Radiology Notify System Classification: Urgent result. Report initiated by:  Carliss Dallas Merles, MD Reported and signed by: Virgene Deters, MD  [facility] Radiology and Biomedical Imaging   ASSESSMENT AND PLAN  Maria [name] is a 73 y.o. 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 We will plan to proceed with a trial of non-operative management. - admit to EGS- NPO/IVF- Please obtain lactate - serial ABD exams - NGT to LCWS      - will obtain CXR to confirm placement For questions, please page: [facility] EGS Discussed with attending Dr. Joshua.All notes preliminary until final attending attestation.Signed:Nickalus Thornsberry Cardell, MDGeneral Surgery Resident11/12/25 Available via Epic Secure Chat===ATTENDING ADDENDUM:   [1] Past Medical History:Diagnosis Date  Acute ischemic colitis (HC Code) 2015  Depression   Diabetes mellitus (HC CODE)   Diabetes mellitus, type II (HC CODE)   Hypercholesteremia   Hypertension   Lupus anticoagulant disorder (HC Code)   Rosacea   Ventral hernia with bowel obstruction   Vertigo  [2] Past Surgical History:Procedure Laterality Date  ABDOMINAL ADHESION SURGERY  2016  BLEPHAROPLASTY Bilateral 05/2016  COLOSTOMY  05/27/13  COLOSTOMY CLOSURE    HYSTEROSCOPY W/ POLYPECTOMY  06/18/2020  KNEE LIGAMENT RECONSTRUCTION Right 2004  LAPAROSCOPIC NISSEN FUNDOPLICATION    2000 ?  SINUS SURGERY    THYROID BIOPSY    TUBAL LIGATION    UMBILICAL HERNIA REPAIR    VENTRAL HERNIA REPAIR   [3] AllergiesAllergen Reactions  Morphine  Hives and Itching   Pt tolerated morphine  during admission 08/08/16-08/09/16.  Pt premedicated with diphenhydramine   Biaxin  [Clarithromycin]   Latex, Natural Rubber Rash  Mitosol [Mitomycin] Rash  Oxycodone  Rash

## 2024-03-16 ENCOUNTER — Telehealth: Admit: 2024-03-16 | Payer: PRIVATE HEALTH INSURANCE | Attending: Surgical Critical Care | Primary: Internal Medicine

## 2024-03-16 NOTE — ED Provider Notes [19]
 Chief Complaint Patient presents with  Abdominal Pain   To ED for eval of mid abd pain that started around noon today, one hour ago started to vomit. Reports recently tx for bowel obs.  -------------------------------------------------------------------------------------------------Emergency Medicine Resident Note History of Present Illness:Maria [name] is a 73 y.o. 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  Patient reports nonradiating severe mid abdominal pain that began suddenly around noon.  Associated nonbloody, nonbilious emesis.  Reports 2 bowel movements today, normal, nonbloody/non tarry.  She states she is passing very little gas.  Denies urinary symptoms, chest pain, shortness of breath, fever, and chills.Vital Signs: Vitals:  03/12/24 1201 BP: (!) 141/77 Pulse: (!) 104 Resp: 18 Temp: 97.7 ?F (36.5 ?C) Physical Exam:General: Patient is alert and in significant distress.HEENT: Head NC/AT.Lung: Normal respiratory effort, lungs CTAB without wheezes, rales, or rhonchi. Speaking in full sentences.Cardiac: RRR, normal S1/S2, no murmurs, rubs, or gallops. Well-perfused extremities.Abdomen: Soft, diffuse tenderness with guarding. Musculoskeletal: Normal muscle tone, normal ROM, sensation intact, brisk capillary refill, 2+ distal pulses equal in all extremities. No lower extremity swelling or calf tenderness.Neurological: Alert and oriented to person, place, time and situation, normal gait.Skin: Intact without rashes, lesions, or erythema. Warm and dry.  Assessment & Plan: This is a 73 y.o. female with history of ischemic colitis and multiple abdominal surgeries complicated by recurrent SBOs who presents with severe abdominal pain and vomiting with pain out of proportion to exam.  Highest concern for ischemic colitis.  [place] Abdomen/Pelvis ordered, radiology contacted to expedite scan.  Also considered SBO versus aortic dissection versus cholecystitis versus pancreatitis versus appendicitis.  In addition to scan, plan for symptomatic treatment, basic labs, LFTs, and UA. The patient was presented to and cared for under the guidance of Dr. Carrol, the attending physician.  Please see Comments section below for detailed ED course, consults, and disposition.  Marolyn Failing, PA-CEmergency Medicine PA Resident 11/20/20259:07 PM------------------------------------------------------------------------------------------------- MDM  Physical ExamED Triage Vitals [03/08/24 2036]BP: (!) 187/78Pulse: (!) 104Pulse from  O2 sat: n/aResp: 20Temp: 97.8 ?F (36.6 ?C)Temp src: OralSpO2: 98 % BP (!) 141/77  - Pulse (!) 104  - Temp 97.7 ?F (36.5 ?C) (Oral)  - Resp 18  - Ht 4' 10 (1.473 m)  - Wt 76 kg  - SpO2 100%  - BMI 35.02 kg/m? Physical Exam ProceduresAttestation/Critical CareComments as of 03/16/24 1732 Wed Mar 08, 2024 2141 WBC(!): 15.3 [AL] 2211 [place] Abdomen Pelvis with IV Contrast without oral (BMI>25)High-grade bowel obstruction with loops of small bowel with transition point near the midline, just deep to the anterior abdominal wall [AL] 2230 NG tube placed, EGS consulted [AL]  Comments User Index[AL] Failing Kast, PA   Clinical Impressions as of 03/16/24 1732 SBO (small bowel obstruction) (HC Code)  (HC CODE)  ED DispositionAdmit ED Attestation: PA/APRNFace to face evaluation was performed by me in collaboration with the Advanced Practice Provider to assess for significant health threats. I provided a substantive portion of the care of this patient.  I personally performed medically appropriate history and physical exam and the MDM:  This is a 73 year old female past history ventral hernia bowel obstruction, lupus, ischemic colitis, now presents the emergency department with mid abdominal pain severe and unremitting which started roughly 1 hour prior to arrival in the  emergency department was associated with vomiting.On my exam:  Patient writhing on stretcher in pain, tachycardic to 104 hypertensive 190/ 80, heart is regular lungs are clear abdomen with diffuse tenderness to palpation and guarding/rebound.  No CVA tenderness.My differential includes:  Ischemic bowel is high on list, alternatively bowel obstruction aortic dissection.  [place] scan abdomen pelvis ordered.10:07 PMCT scan shows high-grade small-bowel obstruction.  Will place NG tube.  Bed request submitted to Norman Molt.  Nursing team instructed to hold patient in the ED until surgery resident evaluates patient in emergency department.Additional acute and/or chronic problems addressed:Matisse Salais Altheria Shadoan, MD11/12/25 2137 Carrol [place], MD11/12/25 2208 Charl Kast, PA11/13/25 0019 Carrol [place], MD11/20/25 1732

## 2024-03-17 ENCOUNTER — Encounter: Admit: 2024-03-17 | Payer: PRIVATE HEALTH INSURANCE | Primary: Internal Medicine

## 2024-03-17 DIAGNOSIS — Z86718 Personal history of other venous thrombosis and embolism: Secondary | ICD-10-CM

## 2024-03-17 DIAGNOSIS — Z5181 Encounter for therapeutic drug level monitoring: Secondary | ICD-10-CM

## 2024-03-17 DIAGNOSIS — D6862 Lupus anticoagulant syndrome: Principal | ICD-10-CM

## 2024-03-21 ENCOUNTER — Telehealth: Admit: 2024-03-21 | Payer: PRIVATE HEALTH INSURANCE | Primary: Internal Medicine

## 2024-03-21 NOTE — Telephone Encounter [36]
 This morning I attempted to contact patient, Maria [name] to remind her that she is due for her INR.  I left a voice message for the patient, asking her to please schedule an appointment for the blood work at her earliest convenience.  Last documented INR is from 11/16.  This is the first reminder phone call.Tully GORMAN Running, CPHT11/25/202510:34 AMSmilow [facility]

## 2024-03-24 ENCOUNTER — Encounter: Admit: 2024-03-24 | Payer: PRIVATE HEALTH INSURANCE | Primary: Internal Medicine

## 2024-03-24 DIAGNOSIS — Z86718 Personal history of other venous thrombosis and embolism: Secondary | ICD-10-CM

## 2024-03-24 DIAGNOSIS — D6862 Lupus anticoagulant syndrome: Principal | ICD-10-CM

## 2024-03-24 DIAGNOSIS — Z5181 Encounter for therapeutic drug level monitoring: Secondary | ICD-10-CM

## 2024-03-27 ENCOUNTER — Ambulatory Visit: Admit: 2024-03-27 | Payer: PRIVATE HEALTH INSURANCE | Primary: Internal Medicine

## 2024-03-27 ENCOUNTER — Inpatient Hospital Stay: Admit: 2024-03-27 | Discharge: 2024-03-27 | Payer: Medicare (Managed Care) | Primary: Internal Medicine

## 2024-03-27 DIAGNOSIS — Z7901 Long term (current) use of anticoagulants: Secondary | ICD-10-CM

## 2024-03-27 DIAGNOSIS — D6862 Lupus anticoagulant syndrome: Principal | ICD-10-CM

## 2024-03-27 DIAGNOSIS — Z5181 Encounter for therapeutic drug level monitoring: Secondary | ICD-10-CM

## 2024-03-27 DIAGNOSIS — K559 Vascular disorder of intestine, unspecified: Secondary | ICD-10-CM

## 2024-03-27 LAB — PROTIME AND INR
BKR INR: 1.27 — ABNORMAL HIGH (ref 0.90–1.10)
BKR PROTHROMBIN TIME: 13.5 s — ABNORMAL HIGH (ref 9.0–12.0)

## 2024-03-27 NOTE — Progress Notes [1]
 Pharmacist Anticoagulation Clinic Telephone Visit Follow Maria [name] ([birth year]) is on chronic anticoagulation for the diagnosis of  Lupus anticoagulant with hypercoagulable state (HC Code)  (primary encounter diagnosis) Ischemic colitis (HC Code) with an INR goal of 2.0-3.0.  The patient was contacted regarding lab drawn INR results.  Patient's INR findings and prescribed dose from last visit:  03/03/2024   4:36 PM 03/13/2024  11:52 AM 03/27/2024   1:57 PM [facility] Monitoring [facility]oc[facility] INR Date 03/03/2024 03/12/2024 03/27/2024 Associated INR 1.70 3.14 1.27 Instructions 11/7: 10 mg; 11/11: 10 mg; Otherwise 10 mg every Tue, Fri; 7.5 mg all other days 11/18: 7.5 mg; Otherwise 10 mg every Tue, Fri; 7.5 mg all other days 12/1: 10 mg; 12/3: 10 mg; 12/4: 10 mg; Otherwise 10 mg every Tue, Fri; 7.5 mg all other days Sunday Dose 7.5 mg - 7.5 mg Monday Dose 7.5 mg 7.5 mg 10 mg (12/1) Tuesday Dose 10 mg (11/11) 7.5 mg (11/18) 10 mg Wednesday Dose 7.5 mg 7.5 mg 10 mg (12/3) Thursday Dose 7.5 mg - 10 mg (12/4) Friday Dose 10 mg (11/7) - 10 mg Saturday Dose 7.5 mg - 7.5 mg Last 7 day dose total 52.5 mg 57.5 mg 47.5 mg Next INR Date 03/10/2024 03/16/2024 04/03/2024 The following findings were reported from today's call (no findings if none selected):[x]  Missed doses []  Extra doses [x]  Change in medications []  Change in vitamin k rich food intake (green vegetables, herbals, etc) []  Change in alcohol use [x]  Change in overall health []  Recent hospitalization / emergency department visit []  Upcoming procedure (including dental procedures) []  Other Comments:       Patient has flu-like symptoms and is taking Robitussin DM. Patient missed Thursdays dose The following side effects were reported from today's call (no side effects if none selected):[]  Bleeding []  New bruising []  Warfarin-emergency department visit or hospital admission []  Other Comments:   No side effects Assessment/Plan:Patient's INR is 1.27, which is below goal range of 2.0-3.0.  Patient has flu-like symptoms and is taking Robitussin DM. Patient missed Thursdays dose therefore she will have 13% increase for this week. Dr. Gwen would like dose escalation alone.  She was instructed to increase weekly dose by 13% as indicated in the maintenance plan below.  Next INR assessment due 1 week. This information was routed to the covering provider for review.Anticoagulation Summary  As of 03/27/2024  INR goal:  2.0-3.0 TTR:  49.5% (9.5 y) INR used for dosing:  1.27 (03/27/2024) Warfarin maintenance plan:  10 mg (5 mg x 2) every Tue, Fri; 7.5 mg (5 mg x 1.5) all other days Weekly warfarin total:  57.5 mg Plan last modified:  [name], [facility] (03/03/2024) Next INR check:  04/03/2024 Priority:  High Priority Target end date:  Indefinite  Indications  Lupus anticoagulant with hypercoagulable state (HC Code) [D68.62]Ischemic colitis (HC Code) [K55.9]   Anticoagulation Episode Summary   INR check location:  --  Preferred lab:  --  Send INR reminders to:  [facility] INR POOL  Comments:  --  Anticoagulation Care Providers   Provider Role Specialty Phone number  Gladis Larraine Hummer, MD Responsible Hematology and Oncology [phone]  Joesph Hun Maxwell, NP Responsible Oncology [phone]  All patient's medications have been reviewed and updated as needed.  Electronically Signed by Elveria Lango, RPH, March 27, 2024 [facility]  [address] [place], [place] [address]  Phone: [phone]  Fax: [phone]

## 2024-03-28 ENCOUNTER — Telehealth: Admit: 2024-03-28 | Payer: PRIVATE HEALTH INSURANCE | Attending: Oncology | Primary: Internal Medicine

## 2024-03-28 NOTE — Telephone Encounter [36]
 Called patient to Rescheduled 3/13 appointment with [facility]. Patient stated [place] will [place] on [place]. She wanted to know if she can be seen sooner ( Offered her 04/21/24 but she said she just saw Dr.Martin in September. ) then 3/1 -possibly some time in February before she leaves. Please advise

## 2024-03-31 ENCOUNTER — Encounter: Admit: 2024-03-31 | Payer: PRIVATE HEALTH INSURANCE | Primary: Internal Medicine

## 2024-03-31 DIAGNOSIS — Z5181 Encounter for therapeutic drug level monitoring: Secondary | ICD-10-CM

## 2024-03-31 DIAGNOSIS — D6862 Lupus anticoagulant syndrome: Principal | ICD-10-CM

## 2024-03-31 DIAGNOSIS — Z86718 Personal history of other venous thrombosis and embolism: Secondary | ICD-10-CM

## 2024-04-03 ENCOUNTER — Ambulatory Visit: Admit: 2024-04-03 | Payer: PRIVATE HEALTH INSURANCE | Attending: Surgical Critical Care | Primary: Internal Medicine

## 2024-04-04 ENCOUNTER — Telehealth: Admit: 2024-04-04 | Payer: PRIVATE HEALTH INSURANCE | Primary: Internal Medicine

## 2024-04-04 ENCOUNTER — Inpatient Hospital Stay: Admit: 2024-04-04 | Discharge: 2024-04-04 | Payer: Medicare (Managed Care) | Primary: Internal Medicine

## 2024-04-04 ENCOUNTER — Ambulatory Visit: Admit: 2024-04-04 | Payer: Medicare (Managed Care) | Attending: Hematology & Oncology | Primary: Internal Medicine

## 2024-04-04 DIAGNOSIS — Z7901 Long term (current) use of anticoagulants: Secondary | ICD-10-CM

## 2024-04-04 DIAGNOSIS — D6862 Lupus anticoagulant syndrome: Principal | ICD-10-CM

## 2024-04-04 DIAGNOSIS — Z5181 Encounter for therapeutic drug level monitoring: Secondary | ICD-10-CM

## 2024-04-04 DIAGNOSIS — Z86718 Personal history of other venous thrombosis and embolism: Secondary | ICD-10-CM

## 2024-04-04 LAB — PROTIME AND INR
BKR INR: 7.84 — CR (ref 0.90–1.10)
BKR PROTHROMBIN TIME: 74 s — ABNORMAL HIGH (ref 9.0–12.0)

## 2024-04-04 NOTE — Progress Notes [1]
 Pt of Dr. Cisco history of + lupus anticoagulant on warfarin with recent admission 11/13 for SBO managed nonoperatively in setting of prior abdominal surgeries with ischemic colitis in the past. She now has a lab with an INR of 7.8 today. I spoke with patient and she has no symptoms of pain, headaches, bleeding, hematochezia, bruising. She is asymptomatic. I spoke with hematologist on call Dr. Hiram who stated if she does not have symptoms we can notify warfarin clinic to contact her tomorrow but she should come in immediately if having symptoms tonight. I spoke to the patient who understands. I will message warfarin clinic who can order urgent INR in the morning. She will hold warfarin this evening and come in if having new symptoms. I will message warfarin clinic this evening as wellDiscussed with Dr. Hiram

## 2024-04-05 ENCOUNTER — Ambulatory Visit: Admit: 2024-04-05 | Payer: PRIVATE HEALTH INSURANCE | Primary: Internal Medicine

## 2024-04-05 DIAGNOSIS — K559 Vascular disorder of intestine, unspecified: Secondary | ICD-10-CM

## 2024-04-05 DIAGNOSIS — D6862 Lupus anticoagulant syndrome: Principal | ICD-10-CM

## 2024-04-05 NOTE — Progress Notes [1]
 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  The patient was contacted regarding lab drawn INR results.  Patient's INR findings and prescribed dose from last visit:  03/13/2024  11:52 AM 03/27/2024   1:57 PM 04/05/2024   9:00 AM [facility] Monitoring [facility]oc[facility] INR Date 03/12/2024 03/27/2024 04/04/2024 Associated INR 3.14 1.27 7.84 Instructions 11/18: 7.5 mg; Otherwise 10 mg every Tue, Fri; 7.5 mg all other days 12/1: 10 mg; 12/3: 10 mg; 12/4: 10 mg; Otherwise 10 mg every Tue, Fri; 7.5 mg all other days 12/10: Hold; Otherwise 10 mg every Tue, Fri; 7.5 mg all other days Sunday Dose - 7.5 mg - Monday Dose 7.5 mg 10 mg (12/1) - Tuesday Dose 7.5 mg (11/18) 10 mg - Wednesday Dose 7.5 mg 10 mg (12/3) Hold (12/10) Thursday Dose - 10 mg (12/4) - Friday Dose - 10 mg - Saturday Dose - 7.5 mg - Last 7 day dose total 57.5 mg 47.5 mg 52.5 mg Next INR Date 03/16/2024 04/03/2024 04/06/2024 The following findings were reported from today's call (no findings if none selected):[]  Missed doses []  Extra doses []  Change in medications []  Change in vitamin k rich food intake (green vegetables, herbals, etc) []  Change in alcohol use []  Change in overall health []  Recent hospitalization / emergency department visit []  Upcoming procedure (including dental procedures) []  Other Comments:       No findings The following side effects were reported from today's call (no side effects if none selected):[]  Bleeding []  New bruising []  Warfarin-emergency department visit or hospital admission []  Other Comments:   No side effects Assessment/Plan:Patient's INR is 7.84, which is above goal range of 2.0-3.0.  Patient reports no signs or symptoms of bleeding and will report to the emergency room if they experience any. She was instructed to hold 2 dose(s) as indicated in the maintenance plan below.  Next INR assessment due 04/06/24. This information was routed to the covering provider for review.Anticoagulation Summary  As of 04/05/2024  INR goal:  2.0-3.0 TTR:  49.4% (9.5 y) INR used for dosing:  7.84 (04/04/2024) Warfarin maintenance plan:  10 mg (5 mg x 2) every Tue, Fri; 7.5 mg (5 mg x 1.5) all other days Weekly warfarin total:  57.5 mg Plan last modified:  [name], [facility] (03/03/2024) Next INR check:  04/06/2024 Priority:  High Priority Target end date:  Indefinite  Indications  Lupus anticoagulant with hypercoagulable state (HC Code) [D68.62]Ischemic colitis (HC Code) [K55.9]   Anticoagulation Episode Summary   INR check location:  --  Preferred lab:  --  Send INR reminders to:  [facility] INR POOL  Comments:  --  Anticoagulation Care Providers   Provider Role Specialty Phone number  Gladis Larraine Hummer, MD Responsible Hematology and Oncology [phone]  Joesph Hun Maxwell, NP Responsible Oncology [phone]  All patient's medications have been reviewed and updated as needed.  Electronically Signed by Elveria Lango, RPH, April 05, 2024 [facility]  [address] [place], [place] [address]  Phone: [phone]  Fax: [phone]

## 2024-04-06 ENCOUNTER — Encounter: Admit: 2024-04-06 | Payer: PRIVATE HEALTH INSURANCE | Attending: Internal Medicine | Primary: Internal Medicine

## 2024-04-06 ENCOUNTER — Inpatient Hospital Stay: Admit: 2024-04-06 | Discharge: 2024-04-06 | Payer: Medicare (Managed Care) | Primary: Internal Medicine

## 2024-04-06 DIAGNOSIS — Z5181 Encounter for therapeutic drug level monitoring: Secondary | ICD-10-CM

## 2024-04-06 DIAGNOSIS — E1142 Type 2 diabetes mellitus with diabetic polyneuropathy: Secondary | ICD-10-CM

## 2024-04-06 DIAGNOSIS — I10 Essential (primary) hypertension: Secondary | ICD-10-CM

## 2024-04-06 DIAGNOSIS — D6852 Prothrombin gene mutation: Secondary | ICD-10-CM

## 2024-04-06 DIAGNOSIS — Z794 Long term (current) use of insulin: Secondary | ICD-10-CM

## 2024-04-06 DIAGNOSIS — Z7901 Long term (current) use of anticoagulants: Secondary | ICD-10-CM

## 2024-04-06 DIAGNOSIS — Z86718 Personal history of other venous thrombosis and embolism: Secondary | ICD-10-CM

## 2024-04-06 DIAGNOSIS — E7800 Pure hypercholesterolemia, unspecified: Secondary | ICD-10-CM

## 2024-04-06 DIAGNOSIS — D6862 Lupus anticoagulant syndrome: Principal | ICD-10-CM

## 2024-04-06 LAB — CBC WITH AUTO DIFFERENTIAL
BKR WAM ABSOLUTE IMMATURE GRANULOCYTES.: 0.03 x 1000/ÂµL (ref 0.00–0.30)
BKR WAM ABSOLUTE LYMPHOCYTE COUNT.: 1.58 x 1000/ÂµL (ref 0.60–3.70)
BKR WAM ABSOLUTE NRBC: 0 x 1000/ÂµL (ref 0.00–1.00)
BKR WAM ANC (ABSOLUTE NEUTROPHIL COUNT): 7.48 x 1000/ÂµL (ref 2.00–7.60)
BKR WAM BASOPHIL ABSOLUTE COUNT.: 0.07 x 1000/ÂµL (ref 0.00–1.00)
BKR WAM BASOPHILS: 0.7 % (ref 0.0–1.4)
BKR WAM EOSINOPHIL ABSOLUTE COUNT.: 0.23 x 1000/ÂµL (ref 0.00–1.00)
BKR WAM EOSINOPHILS: 2.3 % (ref 0.0–5.0)
BKR WAM HEMATOCRIT: 40.6 % (ref 35.00–45.00)
BKR WAM HEMOGLOBIN: 12.8 g/dL (ref 11.7–15.5)
BKR WAM IMMATURE GRANULOCYTES: 0.3 % (ref 0.0–1.0)
BKR WAM LYMPHOCYTES: 16 % — ABNORMAL LOW (ref 17.0–50.0)
BKR WAM MCH: 26.5 pg — ABNORMAL LOW (ref 27.0–33.0)
BKR WAM MCHC: 31.5 g/dL (ref 31.0–36.0)
BKR WAM MCV: 84.1 fL (ref 80.0–100.0)
BKR WAM MONOCYTE ABSOLUTE COUNT.: 0.49 x 1000/ÂµL (ref 0.00–1.00)
BKR WAM MONOCYTES: 5 % (ref 4.0–12.0)
BKR WAM MPV: 10.9 fL (ref 8.0–12.0)
BKR WAM NEUTROPHILS: 75.7 % — ABNORMAL HIGH (ref 39.0–72.0)
BKR WAM NUCLEATED RED BLOOD CELLS: 0 % (ref 0.0–1.0)
BKR WAM PLATELETS: 408 x1000/ÂµL (ref 150–420)
BKR WAM RDW-CV: 16.3 % — ABNORMAL HIGH (ref 11.0–15.0)
BKR WAM RED BLOOD CELL COUNT.: 4.83 M/ÂµL (ref 4.00–6.00)
BKR WAM WHITE BLOOD CELL COUNT: 9.9 x1000/ÂµL (ref 4.0–11.0)

## 2024-04-06 LAB — COMPREHENSIVE METABOLIC PANEL
BKR A/G RATIO: 1.6 (ref 1.0–2.2)
BKR ALANINE AMINOTRANSFERASE (ALT): 21 U/L (ref 10–35)
BKR ALBUMIN: 4.2 g/dL (ref 3.6–5.1)
BKR ALKALINE PHOSPHATASE: 126 U/L — ABNORMAL HIGH (ref 9–122)
BKR ANION GAP: 11 (ref 7–17)
BKR ASPARTATE AMINOTRANSFERASE (AST): 21 U/L (ref 10–35)
BKR AST/ALT RATIO: 1
BKR BILIRUBIN TOTAL: 0.4 mg/dL (ref <=1.2)
BKR BLOOD UREA NITROGEN: 16 mg/dL (ref 8–23)
BKR BUN / CREAT RATIO: 19.3 (ref 8.0–23.0)
BKR CALCIUM: 9.6 mg/dL (ref 8.8–10.2)
BKR CHLORIDE: 100 mmol/L (ref 98–107)
BKR CO2: 26 mmol/L (ref 20–30)
BKR CREATININE DELTA: 0.13
BKR CREATININE: 0.83 mg/dL (ref 0.40–1.30)
BKR EGFR, CREATININE (CKD-EPI 2021): >60 mL/min/1.73m2 (ref >=60)
BKR GLOBULIN: 2.7 g/dL (ref 2.0–3.9)
BKR GLUCOSE: 236 mg/dL — ABNORMAL HIGH (ref 70–100)
BKR POTASSIUM: 5.2 mmol/L (ref 3.3–5.3)
BKR PROTEIN TOTAL: 6.9 g/dL (ref 5.9–8.3)
BKR SODIUM: 137 mmol/L (ref 136–144)

## 2024-04-06 LAB — PROTIME AND INR
BKR INR: 2.74 — ABNORMAL HIGH (ref 0.90–1.10)
BKR PROTHROMBIN TIME: 27.7 s — ABNORMAL HIGH (ref 9.0–12.0)

## 2024-04-06 LAB — URINALYSIS-MACROSCOPIC W/REFLEX MICROSCOPIC
BKR BILIRUBIN, UA: NEGATIVE
BKR BLOOD, UA: NEGATIVE
BKR KETONES, UA: NEGATIVE
BKR LEUKOCYTE ESTERASE, UA: NEGATIVE
BKR NITRITE, UA: NEGATIVE
BKR PH, UA: 7.5 (ref 5.5–7.5)
BKR PROTEIN, UA: NEGATIVE
BKR SPECIFIC GRAVITY, UA: 1.022 (ref 1.005–1.030)
BKR UROBILINOGEN, UA: <2 mg/dL (ref <=2.0)

## 2024-04-06 LAB — LIPID PANEL
BKR CHOLESTEROL/HDL RATIO: 4.1 (ref 0.0–5.0)
BKR CHOLESTEROL: 171 mg/dL
BKR HDL CHOLESTEROL: 42 mg/dL (ref >=40)
BKR LDL CHOLESTEROL SAMPSON CALCULATED: 94 mg/dL
BKR TRIGLYCERIDES: 206 mg/dL — ABNORMAL HIGH

## 2024-04-06 LAB — TSH W/REFLEX TO FT4     (BH GH LMW Q YH): BKR THYROID STIMULATING HORMONE: 0.687 u[IU]/mL

## 2024-04-07 ENCOUNTER — Ambulatory Visit: Admit: 2024-04-07 | Payer: PRIVATE HEALTH INSURANCE | Primary: Internal Medicine

## 2024-04-07 DIAGNOSIS — D6862 Lupus anticoagulant syndrome: Principal | ICD-10-CM

## 2024-04-07 DIAGNOSIS — K559 Vascular disorder of intestine, unspecified: Secondary | ICD-10-CM

## 2024-04-07 LAB — URINE CULTURE

## 2024-04-07 NOTE — Progress Notes [1]
 Pharmacist Anticoagulation Clinic Telephone Visit Follow Maria [name] (18-Jun-1950) is on chronic anticoagulation for the diagnosis of  Lupus anticoagulant with hypercoagulable state (HC Code)  (primary encounter diagnosis) Ischemic colitis (HC Code) with an INR goal of 2.0-3.0.  The patient was contacted regarding lab drawn INR results.  Patient's INR findings and prescribed dose from last visit:  03/27/2024   1:57 PM 04/05/2024   9:00 AM 04/07/2024   9:25 AM Anticoagulation Monitoring Assoc. INR Date 03/27/2024 04/04/2024 04/06/2024 Associated INR 1.27 7.84 2.74 Instructions 12/1: 10 mg; 12/3: 10 mg; 12/4: 10 mg; Otherwise 10 mg every Tue, Fri; 7.5 mg all other days 12/10: Hold; Otherwise 10 mg every Tue, Fri; 7.5 mg all other days 7.5 mg every day Sunday Dose 7.5 mg - 7.5 mg Monday Dose 10 mg (12/1) - 7.5 mg Tuesday Dose 10 mg - 7.5 mg Wednesday Dose 10 mg (12/3) Hold (12/10) 7.5 mg Thursday Dose 10 mg (12/4) - - Friday Dose 10 mg - 7.5 mg Saturday Dose 7.5 mg - 7.5 mg Last 7 day dose total 47.5 mg 52.5 mg 32.5 mg Next INR Date 04/03/2024 04/06/2024 04/13/2024 The following findings were reported from today's call (no findings if none selected):[x]  Missed doses []  Extra doses []  Change in medications []  Change in vitamin k rich food intake (green vegetables, herbals, etc) []  Change in alcohol use []  Change in overall health []  Recent hospitalization / emergency department visit [x]  Upcoming procedure (including dental procedures) []  Other Comments:       Patient missed last nights dose and is having eye surgery in January The following side effects were reported from today's call (no side effects if none selected):[]  Bleeding []  New bruising []  Warfarin-emergency department visit or hospital admission []  Other Comments:   No side effects Assessment/Plan:Patient's INR is 2.74, which is within goal range of 2.0-3.0.  Patient missed last nights dose and is having eye surgery in January. Patients INR was 7.8 on 12/9 therefore dose reduction was applied .  She was instructed to decrease weekly dose by 8% as indicated in the maintenance plan below.  Next INR assessment due 1 week.  Anticoagulation Summary  As of 04/07/2024  INR goal:  2.0-3.0 TTR:  49.4% (9.5 y) INR used for dosing:  2.74 (04/06/2024) Warfarin maintenance plan:  7.5 mg (5 mg x 1.5) every day Weekly warfarin total:  52.5 mg Plan last modified:  Maria [name], [facility] (04/07/2024) Next INR check:  04/13/2024 Priority:  High Priority Target end date:  Indefinite  Indications  Lupus anticoagulant with hypercoagulable state (HC Code) [D68.62]Ischemic colitis (HC Code) [K55.9]   Anticoagulation Episode Summary   INR check location:  --  Preferred lab:  --  Send INR reminders to:  [facility] [place] INR POOL  Comments:  --  Anticoagulation Care Providers   Provider Role Specialty Phone number  Maria Larraine Hummer, MD Responsible Hematology and Oncology [phone]  Maria Landry Maxwell, NP Responsible Oncology [phone]  All patient's medications have been reviewed and updated as needed.  Electronically Signed by Maria [name], RPH, April 07, 2024 [facility]  [address] [place], [place] [address]  Phone: [phone]  Fax: [phone]

## 2024-04-11 ENCOUNTER — Encounter: Admit: 2024-04-11 | Payer: PRIVATE HEALTH INSURANCE | Primary: Internal Medicine

## 2024-04-11 ENCOUNTER — Encounter: Admit: 2024-04-11 | Payer: PRIVATE HEALTH INSURANCE | Attending: Diagnostic Radiology | Primary: Internal Medicine

## 2024-04-11 ENCOUNTER — Ambulatory Visit: Admit: 2024-04-11 | Payer: PRIVATE HEALTH INSURANCE | Primary: Internal Medicine

## 2024-04-11 VITALS — BP 159/82 | HR 101 | Temp 98.00000°F | Ht <= 58 in | Wt 162.0 lb

## 2024-04-11 DIAGNOSIS — K56609 Unspecified intestinal obstruction, unspecified as to partial versus complete obstruction: Principal | ICD-10-CM

## 2024-04-11 DIAGNOSIS — R42 Dizziness and giddiness: Secondary | ICD-10-CM

## 2024-04-11 DIAGNOSIS — K436 Other and unspecified ventral hernia with obstruction, without gangrene: Secondary | ICD-10-CM

## 2024-04-11 DIAGNOSIS — K55039 Acute (reversible) ischemia of large intestine, extent unspecified: Secondary | ICD-10-CM

## 2024-04-11 DIAGNOSIS — E119 Type 2 diabetes mellitus without complications: Secondary | ICD-10-CM

## 2024-04-11 DIAGNOSIS — L719 Rosacea, unspecified: Secondary | ICD-10-CM

## 2024-04-11 DIAGNOSIS — F32A Depression: Principal | ICD-10-CM

## 2024-04-11 DIAGNOSIS — E7800 Pure hypercholesterolemia, unspecified: Secondary | ICD-10-CM

## 2024-04-11 DIAGNOSIS — D6862 Lupus anticoagulant syndrome: Secondary | ICD-10-CM

## 2024-04-11 DIAGNOSIS — I10 Essential (primary) hypertension: Secondary | ICD-10-CM

## 2024-04-11 NOTE — Progress Notes [1]
 [facility]ron Brand, MD Marinus Hedger, MD Renita Rase, MD  Harlene Fries PA-C Donna Hector, MD Eric Jordan, MD Rory Carbo, MD  Rosina Nettle APRN  Rodgers Mau, MD Lavetta Bathe, MD  Marinda Counts, MD  Mliss Bring APRN  Alvaro Perry, MD Morene Jointer, MD  Darice Bolognese, MD  Rockey Rummer APRN Joann Hong-Curtis, MD Venetia Jourdain, MD  Alm Infante, MD  Antonio Dolores APRN Rosaline Remington, MD [name], MD    [name], MD [name], MD    History of Present Illness:Maria [name] is a 73 y.o. 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  I saw her 01/27/2024 for pre colonoscopy appointment, she was admitted to the hospital 02/03/2024 with small-bowel obstruction - colonoscopy was cancelled. She is returning for follow up to rediscuss colonoscopy plan.  Her last colonoscopy was on 12/21/2018 and was notable for internal hemorrhoids and healthy appearing anastomosis.  She presented to the office today with her husband Maria [name].Last Visit 02/03/2024:Maria [name] reports feeling well GI wise, besides complaints of upper abdominal bloating.  Bloating is described in areas where she previously had colostomy hernia repair.  No specific foods seem to trigger bloating and symptoms occur frequently.  She had a bowel resection in 2015 with a temporary colostomy bag was taken down 10 months later.  Currently she has bowel movements 1-2 times per day that are normal in consistency and do not require straining.  She denies hematochezia, melena, and unintentional weight loss. She has a family history of colon cancer in her father, sister, brother, paternal aunt, and cousin, all diagnosed at various ages.  She underwent genetic testing in 2020 and workup came back negative for known detectable mutations. PLAN:Colonoscopy for screening and family history of cancer.  Cardiac clearance obtained, needs to hold warfarin for five days.  Hold Jardiance  and Januvia the night before and morning of colonoscopy, decrease Lantus  by 20% the night before procedure, hold lispro while NPO.Bloating in the area she had previous hernia repair and colostomy, lactose sensitivity.  Provided with FODMAP diet sheet to identify other trigger foods.Today 04/11/2024:Since that visit, she was admitted to the hospital 02/03/2024 with small-bowel obstruction.  [name] demonstrated high-grade small-bowel obstruction in left hemiabdomen with mesenteric congestion, no free air or ischemia.  She was admitted for nonoperative management, NG-tube placed.  She was discharged 10/12.  She was admitted again 03/08/2024 with a high-grade small-bowel obstruction with a transition point near the midline.  She was managed again nonoperatively and was discharged 11/16.  She was told to follow up with Dr. Joshua from General surgery two weeks after discharge, she is scheduled 04/13/2024.She has a family history significant for colon cancer which is why she is on 5 year recalls. Her previous colonoscopies have all been reassuring and genetic testing has also been normal, providing some reassurance despite her family history.She is currently planning on moving to [place]  in March 2026.She  has a past surgical history that includes Knee ligament reconstruction (Right, 2004); Laparoscopic Nissen fundoplication; Umbilical hernia repair; Ventral hernia repair; Tubal ligation; THYROID BIOPSY; Colostomy (05/27/13); Sinus surgery; Abdominal adhesion surgery (2016); Blepharoplasty (Bilateral, 05/2016); Colostomy closure; and Hysteroscopy w/ polypectomy (06/18/2020).Known Family History:Colorectal Cancer: yes - father age 73, sister age 56, brother age 51, paternal aunt age 62, paternal cousin age 39Gastric Cancer, Liver Cancer, or other GI malignancies: noIBD: yes -  sister with Crohn's diseaseCeliac disease: noFamily History Problem Relation Age of Onset  Bladder cancer Mother   Colon cancer Father 72  Diabetes Father   Diabetes Sister   Colon cancer Sister 4  Crohn's disease Sister 2  Colon cancer Brother 36  Colon cancer Paternal Aunt 64  Diabetes Paternal Uncle   Breast cancer Maternal Cousin       2 cousins with breast cancer  Colon cancer Paternal Cousin 62 Previous GI Procedures include the following: EGD: 09/25/2010 for dyspepsia, s/p H pylori treatment- gastric erythema, minimal chronic gastritis, no H pylori, no GIM EGD 11/26/2002 s/p fundoplication: H pylori gastritis, no IMLast Colonoscopy:  12/21/2018 with Dr. Ozell Pfeiffer- internal hemorrhoids, end-to-side ileocolonic anastomosis with healthy-appearing mucosa Colonoscopy 09/25/2010: Int/ext hemorrhoids, no polyps Colonoscopy 06/23/2006: 3 dim polyps of colonic mucosa  Ventral hernia repair 12/02/2015: Ventral hernia with extensive intra-abdominal adhesions Colostomy takedown 02/21/2014: inflammatory polyps at anastomosis  Recall: Colonoscopy 08/26/2025Past Medical History:Past Medical History[1]Past Surgical History:Past Surgical History[2]Current Medications:Current Medications[3]Allergies:Allergies[4]Social History:Social History Tobacco Use  Smoking status: Never  Smokeless tobacco: Never Substance Use Topics  Alcohol use: Not Currently   Alcohol/week: 1.0 standard drink of alcohol   Types: 1 Glasses of wine per week   Comment: 1-2/year General Review of Systems:10 point review of systems negative except as noted above in HPI.Physical Exam:Vital Signs BP (!) 159/82  - Pulse (!) 101  - Temp 98 ?F (36.7 ?C)  - Ht 4' 10 (1.473 m)  - Wt 73.5 kg  - SpO2 98%  - BMI 33.86 kg/m? 	BMI Body mass index is 33.86 kg/m?SABRAWeight: Wt Readings from Last 3 Encounters: 04/11/24 73.5 kg 04/04/24 74.1 kg 03/09/24 76 kg General appearance - alert, well appearing, and in no distressMental status - alert, oriented to person, place, and timeChest - clear to auscultation, no wheezes, rales or rhonchi, symmetric air entryHeart - Rhythm regular.  Normal S1 and S2.  No murmurs, gallops, or rubs.Abdomen - soft, nontender, nondistended, no masses or organomegalyNeurological - alert, oriented, normal speech, no focal findings or movement disorder notedLABS:Lab Results Component Value Date  WBC 9.9 04/06/2024  HGB 12.8 04/06/2024  HCT 40.60 04/06/2024  MCV 84.1 04/06/2024  MCH 26.5 (L) 04/06/2024  PLT 408 04/06/2024  RDW 16.4 (H) 10/24/2014 Lab Results Component Value Date  NA 137 04/06/2024  K 5.2 04/06/2024  CL 100 04/06/2024  CO2 26 04/06/2024  CREATININE 0.83 04/06/2024  GLU 236 (H) 04/06/2024  EGFRCREBLD >60 04/06/2024 Lab Results Component Value Date  BILITOT 0.4 04/06/2024  BILIDIR <0.1 03/08/2024  AST 21 04/06/2024  ALT 21 04/06/2024  PROT 6.9 04/06/2024  ALKPHOS 126 (H) 04/06/2024  ALBUMIN 4.2 04/06/2024 Lab Results Component Value Date  INR 2.74 (H) 04/06/2024 Lab Results Component Value Date  IRON  32 10/23/2014  TIBC 284 (L) 10/23/2014  LABIRON 11 (L) 10/23/2014  FERRITIN 23 10/23/2014  VITAMINB12 511 08/14/2016 Lab Results Component Value Date  TSH 0.687 04/06/2024 No results found for: CRP, CALPROSTLLab Results Component Value Date  LIPASE 25 03/08/2024  AMYLASE 41 08/22/2014 No results found for: FECALFATSEM, GIARDAB, GIARDIA, OVAPARA, LABNORO, CDIFFTOX Most recent imaging:XR Abdomen APResult Date: 11/14/2025Contrast opacifying the small bowel similar to x-ray abdomen from earlier today, contrast is not seen in the large bowel. Unchanged bowel distention. Gastric tube overlying the region of the body of the stomach. [facility] Radiology Notify System Classification: Routine. Report initiated by:  Beulah LULLA Homme, MD Reported and signed by: Ballard Bars, MD  [facility] Radiology and Biomedical Imaging XR Abdomen APResult Date:  03/10/2024 Compared to prior study yesterday, nasogastric tube tip and side port remain in the stomach. Oral contrast continues to opacify the stomach and multiple, similarly dilated loops of bowel. Oral contrast remains similarly distributed throughout small bowel loops, with no evidence of passage into large bowel. Free gas cannot be excluded on a supine only radiograph. If there is concern for free gas, recommend upright frontal view or left lateral decubitus view abdomen radiograph. [facility] Radiology Notify Critical Result system classification: Routine. Reported and signed by: Ballard Bars, MD  [facility] Radiology and Biomedical Imaging XR Abdomen AP w oral contrast (Eval for SBO)Result Date: 11/14/2025Enteric tube coursing through the esophagus into the distal body of stomach. Oral contrast opacifying the stomach and multiple dilated loops of bowel. The contrast passage is appreciated up to the level of mid to distal small bowel. No contrast in the colon. Fecal matter is seen in the right hemicolon. Excreted contrast in the urinary bladder. Limited evaluation for free air on supine radiographs. Reported and signed by: Genell Bowler, MD  [facility] Radiology and Biomedical Imaging XR Chest PA or APResult Date: 03/09/2024 No change since prior study.  Reported and signed by: Deward Llamas, MD  [facility] Radiology and Biomedical Imaging XR Chest PA or AP (Portable)Result Date: 03/09/2024 No radiographic evidence for airspace opacity or acute radiographic change. Reported and signed by: Deward Llamas, MD  [facility] Radiology and Biomedical Imaging XR Chest PA or APResult Date: 03/09/2024 1. Enteric tube is appropriately located below left hemidiaphragm. [facility] Radiology Notify System Classification: Routine. Reported and signed by: Fairy Comp, MD  [facility] Radiology and Biomedical Imaging [place] Abdomen Pelvis with IV Contrast without oral (BMI>25)Result Date: 11/12/2025Acute small bowel obstruction as described. [facility] Radiology Notify System Classification: Urgent result. Report initiated by:  Carliss Dallas Merles, MD Reported and signed by: Virgene Deters, MD  [facility] Radiology and Biomedical Imaging XR Abdomen APResult Date: 10/11/2025Enteric contrast has advanced slightly since prior exam, now reaching the hepatic flexure. There is a paucity of bowel gas. Enteric tube is partially visualized. Surgical clips project over the left lower quadrant. [facility] Radiology Notify System Classification: Routine. Report initiated by:  Daneil Bring, MD Reported and signed by: Gary Israel, MD  [facility] Radiology and Biomedical Imaging XR Chest PA or AP (Portable)Result Date: 02/04/2024 Enteric tube tip projects over the distal stomach. [facility] Radiology Notify System Classification: Routine. Report initiated by:  Alm Fisherman, MD Reported and signed by: Selinda Carrel, MD  [facility] Radiology and Biomedical Imaging XR Chest PA or AP (Portable)Result Date: 02/03/2024 NG tube courses through the stomach and out of view. [facility] Radiology Notify System Classification: Routine. Reported and signed by: Charlena Million, MD  [facility] Radiology and Biomedical Imaging XR Chest PA or AP (Portable)Result Date: 02/03/2024 Enteric tube tip probably postpyloric. [facility] Radiology Notify System Classification: Routine. Reported and signed by: Fonda Lewandowsky, MD  [facility] Radiology and Biomedical Imaging [place] Abdomen Pelvis with IV Contrast without oral (BMI>25)Result Date: 10/9/2025High-grade small bowel obstruction in the left hemiabdomen with transition point where the bowel passes adjacent to the left anterior abdominal wall, likely due to adhesions. Findings were discussed by the radiology resident Dr. Carliss Merles with Dr. Oneil Fiscal  on 02/03/2024 7:31 PM. [facility] Radiology Notify System Classification: Routine. Report initiated by:  Carliss Dallas Merles, MD Reported and signed by: Karie Skene, MD  [facility] Radiology and Biomedical Imaging No results found. Assessment:Likisha Arauz is a 73 y.o. 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  I saw her 01/27/2024 for pre colonoscopy appointment, she was admitted to the hospital 02/03/2024 with small-bowel obstruction - colonoscopy was cancelled. She is returning for follow up to rediscuss colonoscopy plan.  Her last colonoscopy was on 12/21/2018 and was notable for internal hemorrhoids and healthy appearing anastomosis.  She presented to the office today with her husband [place].The patient's symptoms and differential diagnosis were discussed as well as the diagnostic algorithm and treatment in detail.  After discussion, the patient opted to proceed with the plan outlined below:Plan:1. Recurrent small bowel obstructions - two recent hospitalizations since October for small-bowel obstruction.  Both times managed nonoperatively with NG tube for decompression and subsequent discharge home.  She is a follow up appointment scheduled with General surgery in two days.  She is following up with me to discuss colonoscopy screening as her previous colonoscopy had to be canceled due to bowel obstruction.  I do have concerns about bowel prep and colonoscopy with these recurrent obstructions and advised to defer at this time. MRI enterography proposed for better imaging of the small intestine.- Ordered MRI enterography to assess the small intestine.- Scheduled with Dr. Norman Molt for surgical consultation.- Deferred colonoscopy until after surgical evaluation and potential intervention.- Scheduled follow-up appointment in early February to discuss MRI results and surgical consultation outcomes.2. Colon cancer screening and Family history of colon cancer - Her last colonoscopy was on 12/21/2018 and was notable for internal hemorrhoids and healthy appearing anastomosis.  She has a significant family history of colon cancer which is why she has colonoscopies every five years.  She has always had reassuring colonoscopies and negative genetic testing herself.  I advised that she defer colonoscopy at this time due to recurrent small-bowel obstructions until we know if there will be surgical intervention.  She plans to move to [place]  in March, I am unsure that colonoscopy will happen before then.  She is recommended to establish care with a GI in [place]  after moving.- Deferred colonoscopy until after surgical evaluation and potential intervention.Follow up: RTC in 2 months.Thank you very much for involving me in her care.  Please do not hesitate to contact me with any questions.  All the benjaman Rosina Nettle, APRNDigestive DiseaseYale School of Medicine(Please note that portions of this note may have been completed with a voice recognition program.  Efforts were made to edit the dictation, but occasionally words maybe mis-transcribed.)Electronically Signed by Rosina Nettle, APRN, April 11, 2024 [1] Past Medical History:Diagnosis Date  Acute ischemic colitis (HC Code) 2015  Depression   Diabetes mellitus (HC CODE)   Diabetes mellitus, type II (HC CODE)   Hypercholesteremia   Hypertension   Lupus anticoagulant disorder (HC Code)   Rosacea   Ventral hernia with bowel obstruction   Vertigo  [2] Past Surgical History:Procedure Laterality Date  ABDOMINAL ADHESION SURGERY  2016  BLEPHAROPLASTY Bilateral 05/2016  COLOSTOMY 05/27/13  COLOSTOMY CLOSURE    HYSTEROSCOPY W/ POLYPECTOMY  06/18/2020  KNEE LIGAMENT RECONSTRUCTION Right 2004  LAPAROSCOPIC NISSEN FUNDOPLICATION    2000 ?  SINUS SURGERY    THYROID BIOPSY    TUBAL LIGATION    UMBILICAL HERNIA REPAIR    VENTRAL HERNIA REPAIR   [3] Current Outpatient Medications:   biotin 1 mg tablet, Take 1 tablet (1,000 mcg total) by mouth daily., Disp: , Rfl:   BLOOD GLUCOSE METER device, Use to check blood sugar 3 times  per day (Type 2 Diabetes: E11.9 on insulin ). Contour, Disp: 1 each, Rfl: 0  blood sugar diagnostic (CONTOUR TEST STRIPS) test strips, Use to check blood sugar 3 times per day (Type 2 Diabetes: E11.9 on insulin ). Accu-chek, Disp: 300 each, Rfl: 11  blood-glucose sensor (FREESTYLE LIBRE 3 PLUS SENSOR) device, Apply a Maria sensor every 15 days E11.9 on insulin , Disp: 6 each, Rfl: 3  cholecalciferol, vitamin D3, 125 mcg (5,000 unit) tablet, Take 1 tablet (5,000 Units total) by mouth every other day., Disp: , Rfl:   diphenhydrAMINE  (BENADRYL ) 25 mg tablet, Take 1 tablet (25 mg total) by mouth as needed., Disp: , Rfl:   FREESTYLE LIBRE 3 READER Misc, Use as directed., Disp: 1 each, Rfl: 0  gabapentin  (NEURONTIN ) 300 mg capsule, Take 1 capsule (300 mg total) by mouth nightly., Disp: , Rfl:   insulin  glargine (SEMGLEE ,LANTUS ,BASAGLAR ) 100 unit/mL (3 mL) pen, Inject 10 Units under the skin nightly., Disp: , Rfl:   insulin  lispro (HUMALOG  KWIKPEN INSULIN ) 100 unit/mL pen, Inject 2 units with smaller carb meals and 4 units with larger carb meals, Disp: 15 mL, Rfl: 2  insulin  pen needle (NANO 2ND GEN PEN NEEDLE) 32 gauge x 5/32, USE 1 WITH INSULIN  PEN UP TO 4 TIMES DAILY. NEED FOLLOW-UP APPOINTMENT., Disp: 100 each, Rfl: 3  JARDIANCE  25 mg tablet, Take 1 tablet (25 mg total) by mouth daily., Disp: , Rfl:   lancets (FINGERSTIX LANCETS), Use to check blood sugar 3 times per day (Type 2 Diabetes: E11.9 on insulin ). Accu-chek, Disp: 300 each, Rfl: 11  losartan  (COZAAR ) 25 mg tablet, Take 1 tablet (25 mg total) by mouth daily., Disp: , Rfl:   meclizine (ANTIVERT) 12.5 mg tablet, Take 1 tablet (12.5 mg total) by mouth daily as needed for Dizziness, Disp: 30 tablet, Rfl: 1  melatonin 10 mg Tab, Take 10 mg by mouth nightly as needed. , Disp: , Rfl:   multivitamin tablet, Take 1 tablet by mouth daily., Disp: , Rfl:   ondansetron  (ZOFRAN ) 4 MG tablet, Take 1 tablet (4 mg total) by mouth every 8 (eight) hours as needed for nausea., Disp: , Rfl:   phytonadione, vit K1, (VITAMIN K) 100 mcg tablet, Take 1 tablet (100 mcg total) by mouth daily., Disp: , Rfl:   polyethylene glycol (MIRALAX ) 17 gram packet, Take 1 packet (17 g total) by mouth daily. Mix in 8 ounces of water , juice, soda, coffee or tea prior to taking., Disp: 14 each, Rfl: 2  pravastatin  (PRAVACHOL ) 80 mg tablet, Take 1 tablet (80 mg total) by mouth nightly., Disp: , Rfl:   senna (SENOKOT) 8.6 mg tablet, Take 2 tablets by mouth nightly.., Disp: 30 tablet, Rfl: 11  SITagliptin phosphate (JANUVIA) 100 mg tablet, Take 1 tablet (100 mg total) by mouth daily., Disp: 90 tablet, Rfl: 3  venlafaxine  (EFFEXOR  XR) 150 mg XR 24 hr extended release capsule, Take 1 capsule (150 mg total) by mouth daily., Disp: , Rfl:   warfarin (COUMADIN ) 5 mg tablet, Take 1.5 tablets (7.5 mg total) by mouth Daily @1800 ., Disp: , Rfl: [4] AllergiesAllergen Reactions  Morphine  Hives and Itching   Pt tolerated morphine  during admission 08/08/16-08/09/16.  Pt premedicated with diphenhydramine   Biaxin  [Clarithromycin]   Latex, Natural Rubber Rash  Mitosol [Mitomycin] Rash  Oxycodone  Rash

## 2024-04-11 NOTE — Patient Instructions [37]
 Schedule MR enterography [phone]

## 2024-04-13 ENCOUNTER — Ambulatory Visit: Admit: 2024-04-13 | Payer: PRIVATE HEALTH INSURANCE | Attending: Surgical Critical Care | Primary: Internal Medicine

## 2024-04-13 VITALS — BP 144/79 | HR 78 | Temp 97.10000°F | Resp 18

## 2024-04-13 DIAGNOSIS — K565 Intestinal adhesions [bands], unspecified as to partial versus complete obstruction: Secondary | ICD-10-CM

## 2024-04-13 NOTE — Progress Notes [1]
 GENERAL SURGERY CLINIC VISITDate: 12/18/2025NameZoraida Havrilla RiveraMRN: [id number] Date of Birth: 12-Dec-1952PCP: Festus Kitchens [facility] Complaint: Recurrent SBOHistory of Presenting Illness:Outpatient f/u after recent admission for recurrent SBO. She is doing well now. Tolerating a regular diet, having bowel function, and no abdominal pain. She is anxious about having another SBO. She saw GI recently who ordered an MRE.Past Medical History:Past Medical History[1]Past Surgical History:Past Surgical History[2]Family History:Family History Problem Relation Age of Onset  Bladder cancer Mother   Colon cancer Father 71  Diabetes Father   Diabetes Sister   Colon cancer Sister 30  Crohn's disease Sister 71  Colon cancer Brother 41  Colon cancer Paternal Aunt 26  Diabetes Paternal Uncle   Breast cancer Maternal Cousin       2 cousins with breast cancer  Colon cancer Paternal Cousin 42 Social History:Social History Socioeconomic History  Marital status: Married   Spouse name: Not on file  Number of children: Not on file  Years of education: Not on file  Highest education level: Not on file Occupational History  Not on file Tobacco Use  Smoking status: Never  Smokeless tobacco: Never Vaping Use  Vaping status: Never Used Substance and Sexual Activity  Alcohol use: Not Currently   Alcohol/week: 1.0 standard drink of alcohol   Types: 1 Glasses of wine per week   Comment: 1-2/year  Drug use: No  Sexual activity: Not Currently   Partners: Male Other Topics Concern  Not on file Social History Narrative  Not on file Social Drivers of Health Financial Resource Strain: Not on file Food Insecurity: No Food Insecurity (03/09/2024)  Hunger Vital Sign   Worried About Running Out of Food in the Last Year: Never true   Ran Out of Food in the Last Year: Never true Transportation Needs: No Transportation Needs (03/09/2024)  PRAPARE - Designer, Jewellery (Medical): No   Lack of Transportation (Non-Medical): No Physical Activity: Not on file Stress: Not on file Social Connections: Not on file Intimate Partner Violence: Not on file Housing Stability: Low Risk (03/09/2024)  Housing Stability   Housing Stability: I have a steady place to live   Housing Stability: Not on file Medications:Current Medications[3]Allergies:Allergies[4]Review of Systems: Constitutional: Negative Eyes: Negative Ears, Nose, Mouth, Throat: Negative Cardiovascular: Negative Respiratory: Negative Gastrointestinal: Negative Genitourinary: Negative Musculoskeletal: Negative Skin/Integumentary: Negative Neurologic: Negative Psychiatric: Negative Endocrine: Negative Hematologic/Lymphatic: Negative Allergic/Immunologic: Negative Physical Exam:  Objective BP (!) 144/79  - Pulse 78  - Temp 97.1 ?F (36.2 ?C) (Temporal)  - Resp 18  - SpO2 100% General: Well appearing, no distressHEENT: EOMI, sclera anictericCV:  RRR, no peripheral edemaResp: Symmetric expansion on room air, no increased work of breathingAbdomen: Soft, ND, NTGenitourinary: Deferred Skin: No jaundice or rash. Skin is warm and dry. Neurologic: Aox4, no focal deficit.Musculoskeletal: No deformity noted. Hematologic/Lymph/Immunologic: No adenopathy noted. No petechiae. Assessment:Maria [name] is a 73 y.o. 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 While she has had two episodes in the last 3 months, prior to this they were not excessively frequent. We discussed at length that surgery for SBO is generally reserved for signs of ischemia, volvulus/internal hernia, failure to resolve, OR a specific, recurrent lead point (which has not yet been demonstrated.) I also explained that surgery in absence of these indications may fail to improve her symptoms and could pose the risk of both bowel injury and additional adhesion formation. This aligns with what prior surgical consultants have told her as well. That said, should the MRE show a single specific lead  point, elective LOA +/- resection may offer some benefit. She voices understanding all of the above.Plan:- No current plan for surgical intervention- MRE is reasonable given close recurrences recently- Will f/u with GI after MRE- Can f/u with me PRN depending on results of MRE and/or recurrence of symptomsTyler Inge Molt, MD , FACSTrauma/General Surgery/SICU Attending [1] Past Medical History:Diagnosis Date  Acute ischemic colitis (HC Code) 2015  Depression   Diabetes mellitus (HC CODE)   Diabetes mellitus, type II (HC CODE)   Hypercholesteremia   Hypertension   Lupus anticoagulant disorder (HC Code)   Rosacea   Ventral hernia with bowel obstruction   Vertigo  [2] Past Surgical History:Procedure Laterality Date  ABDOMINAL ADHESION SURGERY  2016  BLEPHAROPLASTY Bilateral 05/2016  COLOSTOMY  05/27/13  COLOSTOMY CLOSURE    HYSTEROSCOPY W/ POLYPECTOMY  06/18/2020  KNEE LIGAMENT RECONSTRUCTION Right 2004  LAPAROSCOPIC NISSEN FUNDOPLICATION    2000 ?  SINUS SURGERY    THYROID BIOPSY    TUBAL LIGATION    UMBILICAL HERNIA REPAIR    VENTRAL HERNIA REPAIR   [3] Current Outpatient Medications:   biotin 1 mg tablet, Take 1 tablet (1,000 mcg total) by mouth daily., Disp: , Rfl:   BLOOD GLUCOSE METER device, Use to check blood sugar 3 times per day (Type 2 Diabetes: E11.9 on insulin ). Contour, Disp: 1 each, Rfl: 0  blood sugar diagnostic (CONTOUR TEST STRIPS) test strips, Use to check blood sugar 3 times per day (Type 2 Diabetes: E11.9 on insulin ). Accu-chek, Disp: 300 each, Rfl: 11  blood-glucose sensor (FREESTYLE LIBRE 3 PLUS SENSOR) device, Apply a new sensor every 15 days E11.9 on insulin , Disp: 6 each, Rfl: 3  cholecalciferol, vitamin D3, 125 mcg (5,000 unit) tablet, Take 1 tablet (5,000 Units total) by mouth every other day., Disp: , Rfl:   diphenhydrAMINE  (BENADRYL ) 25 mg tablet, Take 1 tablet (25 mg total) by mouth as needed., Disp: , Rfl:   FREESTYLE LIBRE 3 READER Misc, Use as directed., Disp: 1 each, Rfl: 0  gabapentin  (NEURONTIN ) 300 mg capsule, Take 1 capsule (300 mg total) by mouth nightly., Disp: , Rfl:   insulin  glargine (SEMGLEE ,LANTUS ,BASAGLAR ) 100 unit/mL (3 mL) pen, Inject 10 Units under the skin nightly., Disp: , Rfl:   insulin  lispro (HUMALOG  KWIKPEN INSULIN ) 100 unit/mL pen, Inject 2 units with smaller carb meals and 4 units with larger carb meals, Disp: 15 mL, Rfl: 2  insulin  pen needle (NANO 2ND GEN PEN NEEDLE) 32 gauge x 5/32, USE 1 WITH INSULIN  PEN UP TO 4 TIMES DAILY. NEED FOLLOW-UP APPOINTMENT., Disp: 100 each, Rfl: 3  JARDIANCE  25 mg tablet, Take 1 tablet (25 mg total) by mouth daily., Disp: , Rfl:   lancets (FINGERSTIX LANCETS), Use to check blood sugar 3 times per day (Type 2 Diabetes: E11.9 on insulin ). Accu-chek, Disp: 300 each, Rfl: 11  losartan  (COZAAR ) 25 mg tablet, Take 1 tablet (25 mg total) by mouth daily., Disp: , Rfl:   meclizine (ANTIVERT) 12.5 mg tablet, Take 1 tablet (12.5 mg total) by mouth daily as needed for Dizziness, Disp: 30 tablet, Rfl: 1  melatonin 10 mg Tab, Take 10 mg by mouth nightly as needed. , Disp: , Rfl:   multivitamin tablet, Take 1 tablet by mouth daily., Disp: , Rfl:   ondansetron  (ZOFRAN ) 4 MG tablet, Take 1 tablet (4 mg total) by mouth every 8 (eight) hours as needed for nausea., Disp: , Rfl:   phytonadione, vit K1, (VITAMIN K) 100 mcg tablet, Take 1 tablet (100 mcg  total) by mouth daily., Disp: , Rfl:   polyethylene glycol (MIRALAX ) 17 gram packet, Take 1 packet (17 g total) by mouth daily. Mix in 8 ounces of water , juice, soda, coffee or tea prior to taking., Disp: 14 each, Rfl: 2  pravastatin  (PRAVACHOL ) 80 mg tablet, Take 1 tablet (80 mg total) by mouth nightly., Disp: , Rfl:   senna (SENOKOT) 8.6 mg tablet, Take 2 tablets by mouth nightly.., Disp: 30 tablet, Rfl: 11  SITagliptin phosphate (JANUVIA) 100 mg tablet, Take 1 tablet (100 mg total) by mouth daily., Disp: 90 tablet, Rfl: 3  venlafaxine  (EFFEXOR  XR) 150 mg XR 24 hr extended release capsule, Take 1 capsule (150 mg total) by mouth daily., Disp: , Rfl:   warfarin (COUMADIN ) 5 mg tablet, Take 1.5 tablets (7.5 mg total) by mouth Daily @1800 ., Disp: , Rfl: [4] AllergiesAllergen Reactions  Morphine  Hives and Itching   Pt tolerated morphine  during admission 08/08/16-08/09/16.  Pt premedicated with diphenhydramine   Biaxin  [Clarithromycin]   Latex, Natural Rubber Rash  Mitosol [Mitomycin] Rash  Oxycodone  Rash

## 2024-04-14 ENCOUNTER — Encounter: Admit: 2024-04-14 | Payer: PRIVATE HEALTH INSURANCE | Primary: Internal Medicine

## 2024-04-14 DIAGNOSIS — Z86718 Personal history of other venous thrombosis and embolism: Secondary | ICD-10-CM

## 2024-04-14 DIAGNOSIS — Z5181 Encounter for therapeutic drug level monitoring: Principal | ICD-10-CM

## 2024-04-15 ENCOUNTER — Ambulatory Visit: Admit: 2024-04-15 | Payer: Medicare (Managed Care) | Attending: Hematology & Oncology | Primary: Internal Medicine

## 2024-04-15 ENCOUNTER — Telehealth: Admit: 2024-04-15 | Payer: PRIVATE HEALTH INSURANCE | Primary: Internal Medicine

## 2024-04-15 ENCOUNTER — Encounter: Admit: 2024-04-15 | Payer: PRIVATE HEALTH INSURANCE | Attending: Anesthesiology | Primary: Internal Medicine

## 2024-04-15 ENCOUNTER — Inpatient Hospital Stay: Admit: 2024-04-15 | Discharge: 2024-04-15 | Payer: Medicare (Managed Care) | Primary: Internal Medicine

## 2024-04-15 DIAGNOSIS — Z86718 Personal history of other venous thrombosis and embolism: Secondary | ICD-10-CM

## 2024-04-15 DIAGNOSIS — Z7901 Long term (current) use of anticoagulants: Secondary | ICD-10-CM

## 2024-04-15 DIAGNOSIS — Z5181 Encounter for therapeutic drug level monitoring: Secondary | ICD-10-CM

## 2024-04-15 DIAGNOSIS — D6862 Lupus anticoagulant syndrome: Principal | ICD-10-CM

## 2024-04-15 LAB — PROTIME AND INR
BKR INR: 5.62 — CR (ref 0.90–1.10)
BKR PROTHROMBIN TIME: 54.2 s — ABNORMAL HIGH (ref 9.0–12.0)

## 2024-04-15 NOTE — Telephone Encounter [36]
 I placed a call to Maria [name] as the lab called to notify me of INR 5.6 on routine warfarin monitoring labsMs. 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 She has no evidence of bleeding, abdominal/flank pain. She has not changed her diet recently and shares that she continues to take 100mcg Vit K (which she has chronically taken).I asked her to hold tonight's dose with a planned recheck tomorrow and daily until she is in the therapeutic range. She shared that she is leaving tonight to [place]  but confirms that she has a nearby [facility] lab that she has used in the past for testing. She will be going to [place], [place]. Once she within therapeutic range, I suspect that a decrease in her dose by 15% (possibly warfarin 5 mg once daily Monday, Wednesday and Friday. 7.5mg  on Tuesday, Thursday, Saturday and Sunday) will helpI have placed orders to [facility] for the next two days and will follow up.Signed:Kaikoa Magro Jones-[facility]niY[facility] Hematology Fellow PGY-4

## 2024-04-16 ENCOUNTER — Telehealth: Admit: 2024-04-16 | Payer: PRIVATE HEALTH INSURANCE | Primary: Internal Medicine

## 2024-04-16 NOTE — Telephone Encounter [36]
 Called to check in on [name] since her PT-INR had not yet resulted. She shared that she decided to stay one more night in [place]  and the labs near her are closed today. She is about to head out for her trip and cannot defer it any longer to go to a lab/medical center for testing. She remains without bleeding, new rashes, abdominal/flank pain, or any symptoms whatsoever. I recommended that she hold the warfarin again tonight but to ensure that she get testing immediately on arrival to [facility]. We will attempt [facility] as previously planned but she knows to present to an UC or ER as needed. She will also call the anticoagulation team tomorrow morning. Signed:Ceaira Ernster Jones-WonniY[facility] Hematology Fellow PGY-4

## 2024-04-17 ENCOUNTER — Ambulatory Visit: Admit: 2024-04-17 | Payer: PRIVATE HEALTH INSURANCE | Primary: Internal Medicine

## 2024-04-17 ENCOUNTER — Telehealth: Admit: 2024-04-17 | Payer: PRIVATE HEALTH INSURANCE | Primary: Internal Medicine

## 2024-04-17 DIAGNOSIS — D6862 Lupus anticoagulant syndrome: Principal | ICD-10-CM

## 2024-04-17 DIAGNOSIS — K559 Vascular disorder of intestine, unspecified: Secondary | ICD-10-CM

## 2024-04-17 NOTE — Telephone Encounter [36]
 Pt called in regarding her emergent call regarding her lab work this Saturday (04/15/24). Pt looking for guidance about Warfarin Rx. This clinical research associate spoke with RN who states Pt needs to speak to [employer]. Elveria made aware and states she will call the Pt now. Pt appreciates the update.Thank you!-Blakelee Allington B

## 2024-04-17 NOTE — Progress Notes [1]
 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  The patient was contacted regarding lab drawn INR results.  Patient's INR findings and prescribed dose from last visit:  04/05/2024   9:00 AM 04/07/2024   9:25 AM 04/17/2024   9:02 AM Anticoagulation Monitoring Assoc. INR Date 04/04/2024 04/06/2024 04/15/2024 Associated INR 7.84 2.74 5.62 Instructions 12/10: Hold; Otherwise 10 mg every Tue, Fri; 7.5 mg all other days 7.5 mg every day 7.5 mg every day Sunday Dose - 7.5 mg 7.5 mg Monday Dose - 7.5 mg 7.5 mg Tuesday Dose - 7.5 mg 7.5 mg Wednesday Dose Hold (12/10) 7.5 mg 7.5 mg Thursday Dose - - 7.5 mg Friday Dose - 7.5 mg 7.5 mg Saturday Dose - 7.5 mg 7.5 mg Last 7 day dose total 52.5 mg 32.5 mg 37.5 mg Next INR Date 04/06/2024 04/13/2024  The following findings were reported from today's call (no findings if none selected):[]  Missed doses []  Extra doses []  Change in medications []  Change in vitamin k rich food intake (green vegetables, herbals, etc) []  Change in alcohol use []  Change in overall health []  Recent hospitalization / emergency department visit []  Upcoming procedure (including dental procedures) []  Other Comments:       No findings The following side effects were reported from today's call (no side effects if none selected):[]  Bleeding []  New bruising []  Warfarin-emergency department visit or hospital admission []  Other Comments:   No side effects Assessment/Plan:Patient's INR is 5.62, which is above goal range of 2.0-3.0.  Patient states there have been no changes in medication, diet or health that would impact INR. Patient reports no signs or symptoms of bleeding and will report to the emergency room if they experience any. APRN Joesph would like the patient to take half of tonights dose ( warfarin 3.75 mg) as patient is traveling to [place]  and cannot get INR done until tomorrow..  She was instructed to hold 2 dose(s) and take warfarin 3.75 mg tonight  as indicated in the maintenance plan below.  Next INR assessment due 12/23. 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  Electronically Signed by Coy Lucienne, RPH, April 17, 2024 [facility]  [address] [place], [place] [address]  Phone: [phone]  Fax: [phone]

## 2024-04-19 ENCOUNTER — Encounter: Admit: 2024-04-19 | Payer: PRIVATE HEALTH INSURANCE | Primary: Internal Medicine

## 2024-04-19 ENCOUNTER — Ambulatory Visit: Admit: 2024-04-19 | Payer: PRIVATE HEALTH INSURANCE | Primary: Internal Medicine

## 2024-04-19 DIAGNOSIS — K559 Vascular disorder of intestine, unspecified: Secondary | ICD-10-CM

## 2024-04-19 DIAGNOSIS — D6862 Lupus anticoagulant syndrome: Principal | ICD-10-CM

## 2024-04-19 LAB — PROTIME AND INR
INR: 1.7 — ABNORMAL HIGH
PT: 17.2 s — ABNORMAL HIGH (ref 9.0–11.5)

## 2024-04-19 NOTE — Telephone Encounter [36]
 Patient is calling back to speak to you because she forgot to write down how she should be taking her medication

## 2024-04-19 NOTE — Progress Notes [1]
 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  The patient was contacted regarding lab drawn INR results.  Patient's INR findings and prescribed dose from last visit:  04/17/2024   9:02 AM 04/18/2024  11:08 AM 04/19/2024   8:36 AM Anticoagulation Monitoring INR  1.7   Assoc. INR Date 04/15/2024  04/18/2024 Associated INR 5.62  1.7 Instructions 12/22: 3.75 mg; Otherwise 7.5 mg every day  12/25: 7.5 mg; Otherwise 5 mg every Mon, Thu, Sat; 7.5 mg all other days Sunday Dose -  7.5 mg Monday Dose 3.75 mg (12/22)  - Tuesday Dose -  - Wednesday Dose -  7.5 mg Thursday Dose -  7.5 mg (12/25) Friday Dose -  7.5 mg Saturday Dose -  5 mg Last 7 day dose total 37.5 mg  26.25 mg Next INR Date 04/18/2024  04/24/2024 The following findings were reported from today's call (no findings if none selected):[x]  Missed doses []  Extra doses []  Change in medications []  Change in vitamin k rich food intake (green vegetables, herbals, etc) []  Change in alcohol use []  Change in overall health []  Recent hospitalization / emergency department visit []  Upcoming procedure (including dental procedures) []  Other Comments:       Skipped dose last night The following side effects were reported from today's call (no side effects if none selected):[]  Bleeding []  New bruising []  Warfarin-emergency department visit or hospital admission []  Other Comments:   No side effects Assessment/Plan:Patient's INR is 1.7, which is below goal range of 2.0-3.0.  INR likely even lower as patient states she did not know what to take last night so she skipped her dose.  Confirmed that she otherwise took as documented.  Will decrease maintenance dose by 14.3% per protocol, but will have her take one boosted dose of 7.5 mg instead of 5 mg since she skipped last night as well.  She is traveling and will be able to return to the lab on Monday next week.     She was instructed to take 5 mg on Saturday and Monday and 7.5 mg the remainder of the week this week as indicated in the maintenance plan below.  Next INR assessment due Monday. 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  Electronically Signed by Millman Arcelia, PharmD, April 19, 2024 [facility]  [address] [place], [place] [address]  Phone: [phone]  Fax: [phone]

## 2024-04-25 ENCOUNTER — Ambulatory Visit: Admit: 2024-04-25 | Payer: PRIVATE HEALTH INSURANCE | Primary: Internal Medicine

## 2024-04-25 DIAGNOSIS — D6862 Lupus anticoagulant syndrome: Principal | ICD-10-CM

## 2024-04-25 DIAGNOSIS — K559 Vascular disorder of intestine, unspecified: Secondary | ICD-10-CM

## 2024-04-25 LAB — PROTIME AND INR
INR: 1.3 — ABNORMAL HIGH
PT: 13.2 s — ABNORMAL HIGH (ref 9.0–11.5)

## 2024-04-25 MED ORDER — ENOXAPARIN 100 MG/ML SUBCUTANEOUS SYRINGE
100 | Freq: Every day | SUBCUTANEOUS | 1 refills | 15.00000 days | Status: AC
Start: 2024-04-25 — End: ?

## 2024-04-25 NOTE — Progress Notes [1]
 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  The patient was contacted regarding lab drawn INR results.  Patient's INR findings and prescribed dose from last visit:  04/19/2024   8:36 AM 04/24/2024   1:43 PM 04/25/2024   8:32 AM Anticoagulation Monitoring INR  1.3   Assoc. INR Date 04/18/2024  04/24/2024 Associated INR 1.7  1.3 Instructions 12/25: 7.5 mg; Otherwise 5 mg every Mon, Thu, Sat; 7.5 mg all other days  5 mg every Mon; 7.5 mg all other days Sunday Dose 7.5 mg  7.5 mg Monday Dose -  5 mg Tuesday Dose -  7.5 mg Wednesday Dose 7.5 mg  7.5 mg Thursday Dose 7.5 mg (12/25)  7.5 mg Friday Dose 7.5 mg  7.5 mg Saturday Dose 5 mg  7.5 mg Last 7 day dose total 26.25 mg  40 mg Next INR Date 04/24/2024   The following findings were reported from today's call (no findings if none selected):[]  Missed doses []  Extra doses []  Change in medications []  Change in vitamin k rich food intake (green vegetables, herbals, etc) []  Change in alcohol use []  Change in overall health []  Recent hospitalization / emergency department visit []  Upcoming procedure (including dental procedures) []  Other Comments:       No findings The following side effects were reported from today's call (no side effects if none selected):[]  Bleeding []  New bruising []  Warfarin-emergency department visit or hospital admission []  Other Comments:   No side effects Assessment/Plan:Patient's INR is 1.3, which is below goal range of 2.0-3.0.  Patient states she has not had any medication or dietary changes but has been taking vitamin K 100 mcg. Landry Search would like the patient to bridge with Lovenox  for sub therapeutic INR.  She was instructed to increase weekly dose by 11% and start Lovenox  100 mg sc dailyand stop Vitamin K as indicated in the maintenance plan below.  Next INR assessment due 04/28/24. 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  Electronically Signed by Coy Lucienne, RPH, April 25, 2024 [facility] Anticoagulation [facility]  [address] [place], [place] [address]  Phone: [phone]  Fax: [phone]

## 2024-04-28 ENCOUNTER — Telehealth: Admit: 2024-04-28 | Payer: PRIVATE HEALTH INSURANCE | Primary: Internal Medicine

## 2024-04-28 ENCOUNTER — Encounter: Admit: 2024-04-28 | Payer: PRIVATE HEALTH INSURANCE | Primary: Internal Medicine

## 2024-04-28 ENCOUNTER — Inpatient Hospital Stay: Admit: 2024-04-28 | Discharge: 2024-04-28 | Payer: PRIVATE HEALTH INSURANCE | Primary: Internal Medicine

## 2024-04-28 DIAGNOSIS — K56609 Unspecified intestinal obstruction, unspecified as to partial versus complete obstruction: Principal | ICD-10-CM

## 2024-04-28 MED ORDER — GLUCAGON 1 MG/ML IN STERILE WATER
Freq: Once | INTRAVENOUS | Status: DC
Start: 2024-04-28 — End: 2024-05-03

## 2024-04-28 MED ORDER — GLUCAGON HCL 1 MG/ML SOLUTION FOR INJECTION
1 | Status: CP
Start: 2024-04-28 — End: ?

## 2024-04-28 MED ORDER — WATER FOR INJECTION, STERILE INJECTION SOLUTION
Status: CP
Start: 2024-04-28 — End: ?

## 2024-04-28 MED ORDER — GADOTERATE MEGLUMINE 0.5 MMOL/ML (376.9 MG/ML) INTRAVENOUS SOLUTION
0.5 | Freq: Once | INTRAVENOUS | Status: CP | PRN
Start: 2024-04-28 — End: ?
  Administered 2024-04-28: 15:00:00 0.5 mL via INTRAVENOUS

## 2024-04-28 NOTE — Telephone Encounter [36]
 No results received.  LVM advising to continue with same dosing of 7.5 mg through the weekend + Lovenox  bridge.  Requested that she get her INR checked on Monday.

## 2024-05-01 ENCOUNTER — Ambulatory Visit: Admit: 2024-05-01 | Payer: PRIVATE HEALTH INSURANCE | Primary: Internal Medicine

## 2024-05-01 ENCOUNTER — Inpatient Hospital Stay: Admit: 2024-05-01 | Discharge: 2024-05-01 | Payer: Medicare (Managed Care) | Primary: Internal Medicine

## 2024-05-01 DIAGNOSIS — Z7901 Long term (current) use of anticoagulants: Secondary | ICD-10-CM

## 2024-05-01 DIAGNOSIS — D6862 Lupus anticoagulant syndrome: Principal | ICD-10-CM

## 2024-05-01 DIAGNOSIS — Z86718 Personal history of other venous thrombosis and embolism: Secondary | ICD-10-CM

## 2024-05-01 DIAGNOSIS — Z5181 Encounter for therapeutic drug level monitoring: Secondary | ICD-10-CM

## 2024-05-01 DIAGNOSIS — K559 Vascular disorder of intestine, unspecified: Secondary | ICD-10-CM

## 2024-05-01 LAB — PROTIME AND INR
BKR INR: 1.91 — ABNORMAL HIGH (ref 0.90–1.10)
BKR PROTHROMBIN TIME: 19.8 s — ABNORMAL HIGH (ref 9.0–12.0)

## 2024-05-01 NOTE — Progress Notes [1]
 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  The patient was contacted regarding lab drawn INR results.  Patient's INR findings and prescribed dose from last visit:  04/24/2024   1:43 PM 04/25/2024   8:32 AM 05/01/2024   4:07 PM Anticoagulation Monitoring INR 1.3    Assoc. INR Date  04/24/2024 05/01/2024 Associated INR  1.3 1.91 Instructions  5 mg every Mon; 7.5 mg all other days 7.5 mg every day Sunday Dose  - - Monday Dose  - 7.5 mg Tuesday Dose  7.5 mg 7.5 mg Wednesday Dose  7.5 mg 7.5 mg Thursday Dose  7.5 mg 7.5 mg Friday Dose  - - Saturday Dose  - - Last 7 day dose total  40 mg 50 mg Next INR Date  04/28/2024 05/05/2024 The following findings were reported from today's call (no findings if none selected):[]  Missed doses []  Extra doses []  Change in medications []  Change in vitamin k rich food intake (green vegetables, herbals, etc) []  Change in alcohol use []  Change in overall health []  Recent hospitalization / emergency department visit []  Upcoming procedure (including dental procedures) []  Other Comments:       No findings The following side effects were reported from today's call (no side effects if none selected):[]  Bleeding []  New bruising []  Warfarin-emergency department visit or hospital admission []  Other Comments:   No side effects Assessment/Plan:Patient's INR is 1.9, which is below goal range of 2.0-3.0.  Patient stated she never started Lovenox  bridge. Prescription was sent and instructions were provided by both Elveria Lango and Vernell Riles in the warfarin clinic .  She was instructed to increase weekly dose by 5% as indicated in the maintenance plan below.  Next INR assessment due 05/05/24. 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  Electronically Signed by Elveria Lango, RPH, May 01, 2024 [facility] Anticoagulation Clinic  [address] [place], [place] [address]  Phone: [phone]  Fax: [phone]

## 2024-05-04 ENCOUNTER — Telehealth: Admit: 2024-05-04 | Payer: PRIVATE HEALTH INSURANCE | Attending: Hematology & Oncology | Primary: Internal Medicine

## 2024-05-04 NOTE — Telephone Encounter [36]
 Called patient to reschedule September visit with Dr. Gladis due to provider ooo. Patient will be moving to [place]  and will not be in the state in Sept.

## 2024-05-05 ENCOUNTER — Ambulatory Visit: Admit: 2024-05-05 | Payer: PRIVATE HEALTH INSURANCE | Attending: Oncology | Primary: Internal Medicine

## 2024-05-05 ENCOUNTER — Ambulatory Visit: Admit: 2024-05-05 | Payer: PRIVATE HEALTH INSURANCE | Primary: Internal Medicine

## 2024-05-05 ENCOUNTER — Encounter: Admit: 2024-05-05 | Payer: PRIVATE HEALTH INSURANCE | Attending: Oncology | Primary: Internal Medicine

## 2024-05-05 ENCOUNTER — Inpatient Hospital Stay: Admit: 2024-05-05 | Discharge: 2024-05-05 | Payer: PRIVATE HEALTH INSURANCE | Primary: Internal Medicine

## 2024-05-05 VITALS — BP 153/77 | HR 85 | Temp 97.30000°F | Resp 16 | Wt 163.0 lb

## 2024-05-05 DIAGNOSIS — Z5181 Encounter for therapeutic drug level monitoring: Secondary | ICD-10-CM

## 2024-05-05 DIAGNOSIS — D6862 Lupus anticoagulant syndrome: Principal | ICD-10-CM

## 2024-05-05 DIAGNOSIS — L719 Rosacea, unspecified: Secondary | ICD-10-CM

## 2024-05-05 DIAGNOSIS — K55039 Acute (reversible) ischemia of large intestine, extent unspecified: Secondary | ICD-10-CM

## 2024-05-05 DIAGNOSIS — K436 Other and unspecified ventral hernia with obstruction, without gangrene: Secondary | ICD-10-CM

## 2024-05-05 DIAGNOSIS — Z86718 Personal history of other venous thrombosis and embolism: Secondary | ICD-10-CM

## 2024-05-05 DIAGNOSIS — K559 Vascular disorder of intestine, unspecified: Principal | ICD-10-CM

## 2024-05-05 DIAGNOSIS — Z7901 Long term (current) use of anticoagulants: Secondary | ICD-10-CM

## 2024-05-05 DIAGNOSIS — E7800 Pure hypercholesterolemia, unspecified: Secondary | ICD-10-CM

## 2024-05-05 DIAGNOSIS — E119 Type 2 diabetes mellitus without complications: Secondary | ICD-10-CM

## 2024-05-05 DIAGNOSIS — F32A Depression: Principal | ICD-10-CM

## 2024-05-05 DIAGNOSIS — R42 Dizziness and giddiness: Secondary | ICD-10-CM

## 2024-05-05 DIAGNOSIS — I10 Essential (primary) hypertension: Secondary | ICD-10-CM

## 2024-05-05 LAB — CBC WITH AUTO DIFFERENTIAL
BKR WAM ABSOLUTE IMMATURE GRANULOCYTES.: 0.04 x 1000/ÂµL (ref 0.00–0.30)
BKR WAM ABSOLUTE LYMPHOCYTE COUNT.: 1.58 x 1000/ÂµL (ref 0.60–3.70)
BKR WAM ABSOLUTE NRBC: 0 x 1000/ÂµL (ref 0.00–1.00)
BKR WAM ANC (ABSOLUTE NEUTROPHIL COUNT): 7.14 x 1000/ÂµL (ref 2.00–7.60)
BKR WAM BASOPHIL ABSOLUTE COUNT.: 0.06 x 1000/ÂµL (ref 0.00–1.00)
BKR WAM BASOPHILS: 0.6 % (ref 0.0–1.4)
BKR WAM EOSINOPHIL ABSOLUTE COUNT.: 0.18 x 1000/ÂµL (ref 0.00–1.00)
BKR WAM EOSINOPHILS: 1.9 % (ref 0.0–5.0)
BKR WAM HEMATOCRIT: 42.6 % (ref 35.00–45.00)
BKR WAM HEMOGLOBIN: 13.1 g/dL (ref 11.7–15.5)
BKR WAM IMMATURE GRANULOCYTES: 0.4 % (ref 0.0–1.0)
BKR WAM LYMPHOCYTES: 16.7 % — ABNORMAL LOW (ref 17.0–50.0)
BKR WAM MCH: 25.6 pg — ABNORMAL LOW (ref 27.0–33.0)
BKR WAM MCHC: 30.8 g/dL — ABNORMAL LOW (ref 31.0–36.0)
BKR WAM MCV: 83.2 fL (ref 80.0–100.0)
BKR WAM MONOCYTE ABSOLUTE COUNT.: 0.47 x 1000/ÂµL (ref 0.00–1.00)
BKR WAM MONOCYTES: 5 % (ref 4.0–12.0)
BKR WAM MPV: 11.2 fL (ref 8.0–12.0)
BKR WAM NEUTROPHILS: 75.4 % — ABNORMAL HIGH (ref 39.0–72.0)
BKR WAM NUCLEATED RED BLOOD CELLS: 0 % (ref 0.0–1.0)
BKR WAM PLATELETS: 408 x1000/ÂµL (ref 150–420)
BKR WAM RDW-CV: 15.9 % — ABNORMAL HIGH (ref 11.0–15.0)
BKR WAM RED BLOOD CELL COUNT.: 5.12 M/ÂµL (ref 4.00–6.00)
BKR WAM WHITE BLOOD CELL COUNT: 9.5 x1000/ÂµL (ref 4.0–11.0)

## 2024-05-05 LAB — COMPREHENSIVE METABOLIC PANEL
BKR A/G RATIO: 1.1 (ref 1.0–2.2)
BKR ALANINE AMINOTRANSFERASE (ALT): 16 U/L (ref 10–35)
BKR ALBUMIN: 4 g/dL (ref 3.6–5.1)
BKR ALKALINE PHOSPHATASE: 123 U/L — ABNORMAL HIGH (ref 9–122)
BKR ANION GAP: 7 (ref 7–17)
BKR ASPARTATE AMINOTRANSFERASE (AST): 18 U/L (ref 10–35)
BKR AST/ALT RATIO: 1.1
BKR BILIRUBIN TOTAL: 0.4 mg/dL (ref <=1.2)
BKR BLOOD UREA NITROGEN: 14 mg/dL (ref 8–23)
BKR BUN / CREAT RATIO: 16.9 (ref 8.0–23.0)
BKR CALCIUM: 9.6 mg/dL (ref 8.8–10.2)
BKR CHLORIDE: 99 mmol/L (ref 98–107)
BKR CO2: 29 mmol/L (ref 20–30)
BKR CREATININE DELTA: 0
BKR CREATININE: 0.83 mg/dL (ref 0.40–1.30)
BKR EGFR, CREATININE (CKD-EPI 2021): >60 mL/min/1.73m2 (ref >=60)
BKR GLOBULIN: 3.7 g/dL (ref 2.0–3.9)
BKR GLUCOSE: 220 mg/dL — ABNORMAL HIGH (ref 70–100)
BKR POTASSIUM: 4.2 mmol/L (ref 3.3–5.3)
BKR PROTEIN TOTAL: 7.7 g/dL (ref 5.9–8.3)
BKR SODIUM: 135 mmol/L — ABNORMAL LOW (ref 136–144)

## 2024-05-05 LAB — IRON AND TIBC
BKR IRON SATURATION: 16 % (ref 15–50)
BKR IRON: 48 ug/dL (ref 37–145)
BKR TOTAL IRON BINDING CAPACITY: 302 ug/dL (ref 250–450)

## 2024-05-05 LAB — PROTIME AND INR
BKR INR: 3.17 — ABNORMAL HIGH (ref 0.90–1.10)
BKR PROTHROMBIN TIME: 31.5 s — ABNORMAL HIGH (ref 9.0–12.0)

## 2024-05-05 LAB — FERRITIN: BKR FERRITIN: 36 ng/mL (ref 15–150)

## 2024-05-05 NOTE — Progress Notes [1]
 PATIENT: Maria [name] DOB: 1952/08/23MRN: [id number] SEEN BY PROVIDER:Vonte Rossin LITTIE Search, [place] HEMATOLOGIST/ONCOLOGIST: Dr Gladis, Larraine Hummer, MDREFERRING PROVIDER:Dr No ref. 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 HPI INTERIM HISTORY:01/30/2024The patient returns for six-month follow-up.She remains at her baseline state of health.  She denies exacerbations of colitis.  She has mild body aches related to winter temperatures.  She is just back from a pleasant trip visiting family in [place] .  She would like to eventually relocated to [place] .  Recently started a new medication for hypertension.  She continues on Coumadin  with fairly stable dosing.  Denies abnormal bleeding.  She has occasional easy bruising.  Denies any swelling, cough, chest pain or shortness of breath.Labs reviewed:  WBC 10.7, RBC 4.86, hemoglobin 13.2, hematocrit 41.6, MCV 85.6, platelets 292, INR 1.5.01/09/2026The patient returns for routine follow-up.  She was recently found to be sub therapeutic on warfarin and was recommended bridging with Lovenox .  She states she had difficulty following these instructions and though she obtain the prescription did not take Lovenox .  She continues on warfarin 7.5 mg daily.  INR is therapeutic today.  Denies abnormal bruising or bleeding.  Denies any cough, chest pain, shortness of breath or leg swelling.  She notes that she will be moving to [place]  in March and subsequently to [place] .  She has not yet set up medical care in [place] .  Cataract extraction is planned in February.Labs reviewed:  WBC 9.5, hemoglobin 13.1, hematocrit 42.6, platelets 408, INR 3.1.REVIEW OF SYSTEMS:Review of Systems Constitutional: Negative.  HENT: Negative.   Eyes: Negative.  Respiratory: Negative.   Cardiovascular: Negative.  Gastrointestinal: Negative.  Endocrine: Negative.  Genitourinary: Negative.  Musculoskeletal:  Positive for arthralgias. Skin: Negative.  Allergic/Immunologic: Negative.  Neurological: Negative.  Hematological: Negative.  Psychiatric/Behavioral: Negative.   The remainder of the 12 point review of systems is otherwise negative.ALLERGIES:Morphine ; Biaxin  [clarithromycin]; Latex, natural rubber; Mitosol [mitomycin]; and OxycodoneCURRENT MEDICATIONS:Current Medications Medication Sig  biotin 1 mg tablet Take 1 tablet (1,000 mcg total) by mouth daily.  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 Accu-chek  blood-glucose sensor (FREESTYLE LIBRE 3 PLUS SENSOR) device Apply a new sensor every 15 days E11.9 on insulin   cholecalciferol, vitamin D3, 125 mcg (5,000 unit) tablet Take 1 tablet (5,000 Units total) by mouth every other day.  diphenhydrAMINE  (BENADRYL ) 25 mg tablet Take 1 tablet (25 mg total) by mouth as needed. enoxaparin  (LOVENOX ) 100 mg/mL syringe Inject 1 mL (100 mg total) under the skin daily.  FREESTYLE LIBRE 3 READER Misc Use as directed.  gabapentin  (NEURONTIN ) 300 mg capsule Take 1 capsule (300 mg total) by mouth nightly.  insulin  glargine (SEMGLEE ,LANTUS ,BASAGLAR ) 100 unit/mL (3 mL) pen  Inject 10 Units under the skin nightly.  insulin  lispro (HUMALOG  KWIKPEN INSULIN ) 100 unit/mL pen Inject 2 units with smaller carb meals and 4 units with larger carb meals  insulin  pen needle (NANO 2ND GEN PEN NEEDLE) 32 gauge x 5/32 USE 1 WITH INSULIN  PEN UP TO 4 TIMES DAILY. NEED FOLLOW-UP APPOINTMENT.  JARDIANCE  25 mg tablet Take 1 tablet (25 mg total) by mouth daily.  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 Accu-chek  losartan  (COZAAR ) 25 mg tablet Take 1 tablet (25 mg total) by mouth daily.  meclizine (ANTIVERT) 12.5 mg tablet Take 1 tablet (12.5 mg total) by mouth daily as needed for Dizziness  melatonin 10 mg Tab Take 10 mg by mouth nightly as needed.   multivitamin tablet Take 1 tablet by mouth daily.  ondansetron  (ZOFRAN ) 4 MG tablet Take 1 tablet (4 mg total) by mouth every 8 (eight) hours as needed for nausea.  phytonadione, vit K1, (VITAMIN K) 100 mcg tablet Take 1 tablet (100 mcg total) by mouth daily.  polyethylene glycol (MIRALAX ) 17 gram packet Take 1 packet (17 g total) by mouth daily. Mix in 8 ounces of water , juice, soda, coffee or tea prior to taking.  pravastatin  (PRAVACHOL ) 80 mg tablet Take 1 tablet (80 mg total) by mouth nightly.  senna (SENOKOT) 8.6 mg tablet Take 2 tablets by mouth nightly.[name]  SITagliptin phosphate (JANUVIA) 100 mg tablet Take 1 tablet (100 mg total) by mouth daily.  venlafaxine  (EFFEXOR  XR) 150 mg XR 24 hr extended release capsule Take 1 capsule (150 mg total) by mouth daily.  warfarin (COUMADIN ) 5 mg tablet Take 1.5 tablets (7.5 mg total) by mouth Daily @1800 . 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); Sinus surgery; Abdominal adhesion surgery (2016); Blepharoplasty (Bilateral, 05/2016); Colostomy closure; and Hysteroscopy w/ polypectomy (06/18/2020).FAMILY HISTORY:family history includes Bladder cancer in her mother; Breast cancer in her maternal cousin; Colon cancer (age of onset: 49) in her paternal cousin; Colon cancer (age of onset: 82) in her father and sister; Colon cancer (age of onset: 18) in her brother; Colon cancer (age of onset: 37) in her paternal aunt; Crohn's disease (age of onset: 70) in her sister; Diabetes in her father, paternal uncle, and sister.SOCIAL HISTORY: reports that she has never smoked. She has never used smokeless tobacco. She reports that she does not currently use alcohol after a past usage of about 1.0 standard drink of alcohol per week. She reports that she does not use drugs.PHYSICAL EXAM:BP (!) 153/77  - Pulse 85  - Temp 97.3 ?F (36.3 ?C) (Temporal)  - Resp 16  - Wt 73.9 kg  - SpO2 98%  - BMI 34.07 kg/m? PAIN RATING:Pain Score:   0 - No pain Physical ExamConstitutional:     Appearance: Normal appearance. HENT:    Head: Normocephalic and atraumatic.    Nose: Nose normal.    Mouth/Throat:    Mouth: Mucous membranes are moist.    Pharynx: Oropharynx is clear. Eyes:    Conjunctiva/sclera: Conjunctivae normal. Cardiovascular:    Rate and Rhythm: Normal rate. Pulmonary:  Effort: Pulmonary effort is normal. Abdominal:    Palpations: Abdomen is soft. Musculoskeletal:       General: Normal range of motion.    Cervical back: Normal range of motion. Skin:   General: Skin is warm and dry. Neurological:    General: No focal deficit present.    Mental Status: She is alert and oriented to person, place, and time. Psychiatric:       Mood and Affect: Mood normal.       Behavior: Behavior normal.       Thought Content: Thought content normal. LABS:Results for orders placed or performed in visit on 05/05/24 Protime and INR  Collection Time: 05/05/24  9:38 AM Result Value Ref Range  Prothrombin Time 31.5 (H) 9.0 - 12.0 seconds  INR 3.17 (H) 0.90 - 1.10 CBC auto differential  Collection Time: 05/05/24  9:38 AM Result Value Ref Range  WBC 9.5 4.0 - 11.0 x1000/?L  RBC 5.12 4.00 - 6.00 M/?L  Hemoglobin 13.1 11.7 - 15.5 g/dL  Hematocrit 57.39 64.99 - 45.00 %  MCV 83.2 80.0 - 100.0 fL  MCH 25.6 (L) 27.0 - 33.0 pg  MCHC 30.8 (L) 31.0 - 36.0 g/dL  RDW-CV 84.0 (H) 88.9 - 15.0 %  Platelets 408 150 - 420 x1000/?L  MPV 11.2 8.0 - 12.0 fL  Neutrophils 75.4 (H) 39.0 - 72.0 %  Lymphocytes 16.7 (L) 17.0 - 50.0 %  Monocytes 5.0 4.0 - 12.0 %  Eosinophils 1.9 0.0 - 5.0 %  Basophil 0.6 0.0 - 1.4 %  Immature Granulocytes 0.4 0.0 - 1.0 %  nRBC 0.0 0.0 - 1.0 %  Absolute Lymphocyte Count 1.58 0.60 - 3.70 x 1000/?L  Monocyte Absolute Count 0.47 0.00 - 1.00 x 1000/?L  Eosinophil Absolute Count 0.18 0.00 - 1.00 x 1000/?L  Basophil Absolute Count 0.06 0.00 - 1.00 x 1000/?L  Absolute Immature Granulocyte Count 0.04 0.00 - 0.30 x 1000/?L  Absolute nRBC 0.00 0.00 - 1.00 x 1000/?L  ANC (Abs Neutrophil Count) 7.14 2.00 - 7.60 x 1000/?L Comprehensive metabolic panel  Collection Time: 05/05/24  9:38 AM Result Value Ref Range  Sodium 135 (L) 136 - 144 mmol/L  Potassium 4.2 3.3 - 5.3 mmol/L  Chloride 99 98 - 107 mmol/L  CO2 29 20 - 30 mmol/L  Anion Gap 7 7 - 17  Glucose 220 (H) 70 - 100 mg/dL  BUN 14 8 - 23 mg/dL  Creatinine 9.16 9.59 - 1.30 mg/dL  Calcium  9.6 8.8 - 10.2 mg/dL  BUN/Creatinine Ratio 83.0 8.0 - 23.0  Total Protein 7.7 5.9 - 8.3 g/dL  Albumin 4.0 3.6 - 5.1 g/dL  Total Bilirubin 0.4 <=8.7 mg/dL  Alkaline Phosphatase 876 (H) 9 - 122 U/L  Alanine Aminotransferase (ALT) 16 10 - 35 U/L  Aspartate Aminotransferase (AST) 18 10 - 35 U/L  Globulin 3.7 2.0 - 3.9 g/dL  A/G Ratio 1.1 1.0 - 2.2  AST/ALT Ratio 1.1 Reference Range Not Established  eGFR (Creatinine) >60 >=60 mL/min/1.42m2  Creatinine Delta 0 See Comment Iron  and TIBC  Collection Time: 05/05/24  9:38 AM Result Value Ref Range  Iron  48 37 - 145 ug/dL  Iron  Saturation 16 15 - 50 %  TIBC 302 250 - 450 ug/dL Ferritin  Collection Time: 05/05/24  9:38 AM Result Value Ref Range  Ferritin 36 15 - 150 ng/mL *Note: Due to a large number of results and/or encounters for the requested time period, some results have not been displayed. A complete set of results can be  found in Results Review. DIAGNOSTICS:No results found.ASSESSMENT:73 y.o. female with history of lupus anticoagulant antibody, maintained on long-term anticoagulation with Coumadin .  She also has a history of ischemic colitis.Labs reviewed:  WBC 9.5, hemoglobin 13.1, hematocrit 42.6, platelets 408, INR 3.1.PLAN:- Continue long-term anticoagulation with Coumadin  as monitored by the Coumadin  Clinic.-  Recommend patient urgently reach out to establish medical care as she plans to relocate to [place]  in 2 months.-    Given relocation plans, no follow-up will be scheduled in our clinic although we would be happy to see her in the future if she remains in [place] .ORDERS PLACED DURING THIS ENCOUNTER:Orders Placed This Encounter Procedures  Iron , TIBC and ferritin panel ([facility] LMW Q Y[facility])  Protime and INR  CBC auto differential  Comprehensive metabolic panel  Iron  and TIBC  Ferritin  CBC and differential  CMP  No Follow-up Needed with Me On the day of this patient's encounter, a total of 30 minutes was personally spent by me.  20 minutes of the visit were dedicated to counseling and coordination of care.  The patient reported understanding of the teaching presented and all questions were answered.  10 minutes of non face-to-face time were spent in chart review.SIGNED:Sianna Garofano LITTIE Search MS, ANP-BC, AOCNSmilow Classical and Malignant HematologyYale [facility] HealthPhone [phone] [facility]  [phone]

## 2024-05-05 NOTE — Progress Notes [1]
 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  The patient was contacted regarding lab drawn INR results.  Patient's INR findings and prescribed dose from last visit:  04/25/2024   8:32 AM 05/01/2024   4:07 PM 05/05/2024  10:14 AM Anticoagulation Monitoring Assoc. INR Date 04/24/2024 05/01/2024 05/05/2024 Associated INR 1.3 1.91 3.17 Instructions 5 mg every Mon; 7.5 mg all other days 7.5 mg every day 5 mg every Fri; 7.5 mg all other days Sunday Dose - - 7.5 mg Monday Dose - 7.5 mg 7.5 mg Tuesday Dose 7.5 mg 7.5 mg 7.5 mg Wednesday Dose 7.5 mg 7.5 mg 7.5 mg Thursday Dose 7.5 mg 7.5 mg 7.5 mg Friday Dose - - 5 mg Saturday Dose - - 7.5 mg Last 7 day dose total 40 mg 50 mg 52.5 mg Next INR Date 04/28/2024 05/05/2024 05/23/2024 The following findings were reported from today's call (no findings if none selected):[]  Missed doses []  Extra doses []  Change in medications []  Change in vitamin k rich food intake (green vegetables, herbals, etc) []  Change in alcohol use []  Change in overall health []  Recent hospitalization / emergency department visit [x]  Upcoming procedure (including dental procedures) []  Other Comments:       Patient is having cataract surgery 05/19/24 The following side effects were reported from today's call (no side effects if none selected):[]  Bleeding []  New bruising []  Warfarin-emergency department visit or hospital admission []  Other Comments:   No side effects Assessment/Plan:Patient's INR is 3.1, which is above goal range of 2.0-3.0.  Patient has had multiple dose increase to reach goal warranting small dose reduction.  She was instructed to decrease weekly dose by 5% as indicated in the maintenance plan below.  Next INR assessment due 2 weeks.  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  Electronically Signed by Coy Lucienne, RPH, May 05, 2024 [facility] Anticoagulation Clinic  [address] [place], [place] [address]  Phone: [phone]  Fax: [phone]

## 2024-05-08 ENCOUNTER — Encounter: Admit: 2024-05-08 | Payer: PRIVATE HEALTH INSURANCE | Attending: Ophthalmology | Primary: Internal Medicine

## 2024-05-12 ENCOUNTER — Telehealth: Admit: 2024-05-12 | Payer: PRIVATE HEALTH INSURANCE | Attending: Ophthalmology | Primary: Internal Medicine

## 2024-05-14 ENCOUNTER — Encounter: Admit: 2024-05-14 | Payer: PRIVATE HEALTH INSURANCE | Attending: Ophthalmology | Primary: Internal Medicine

## 2024-05-14 MED ORDER — PREDNISOLONE ACETATE 1 % EYE DROPS,SUSPENSION
1 | OPHTHALMIC | 2 refills | Status: AC
Start: 2024-05-14 — End: ?

## 2024-05-14 MED ORDER — KETOROLAC 0.5 % EYE DROPS
0.5 | Freq: Four times a day (QID) | OPHTHALMIC | 1 refills | Status: AC
Start: 2024-05-14 — End: ?

## 2024-05-14 MED ORDER — MOXIFLOXACIN 0.5 % EYE DROPS
0.5 | Freq: Four times a day (QID) | OPHTHALMIC | 1 refills | Status: AC
Start: 2024-05-14 — End: ?

## 2024-05-16 MED ORDER — CEFUROXIME 1 MG/0.1 ML OPHTHALMIC INJECTION (MIXTURE)
Freq: Once | INTRACAMERAL | Status: DC
Start: 2024-05-16 — End: 2024-05-18

## 2024-05-16 NOTE — Telephone Encounter [36]
 Copied from CRM #[id number]. Topic: General Message - [facility]>> May 12, 2024 12:51 PM Davied HERO wrote:[facility] MESSAGETime of call:   12:51 PMCaller:   Martenson, SOFIACaller's relationship to patient:  self  Calling from (pharmacy, hospital, agency, etc.):  Specialist you are calling for:  Dr. Meredeth, oph Reason for call:   pt stated that she is having surgery with dr chow and is taking blood thinners (warfarin) pt would like instruction before surgery date pls advise. If not feeling well, what are symptoms:  n/a   If having symptoms, how long have the symptoms been present:  n/a   Does caller request to speak to someone urgently?  no   Best telephone number for callback: [phone] this a private/backline number:  NoBest time to return call:   anytime Permission to leave message:  yes   [facility] Referral Specialist

## 2024-05-17 ENCOUNTER — Encounter: Admit: 2024-05-17 | Payer: PRIVATE HEALTH INSURANCE | Attending: Ophthalmology | Primary: Internal Medicine

## 2024-05-17 ENCOUNTER — Encounter: Admit: 2024-05-17 | Payer: PRIVATE HEALTH INSURANCE | Attending: Hematology & Oncology | Primary: Internal Medicine

## 2024-05-17 DIAGNOSIS — K436 Other and unspecified ventral hernia with obstruction, without gangrene: Secondary | ICD-10-CM

## 2024-05-17 DIAGNOSIS — K55039 Acute (reversible) ischemia of large intestine, extent unspecified: Secondary | ICD-10-CM

## 2024-05-17 DIAGNOSIS — D6862 Lupus anticoagulant syndrome: Secondary | ICD-10-CM

## 2024-05-17 DIAGNOSIS — E7800 Pure hypercholesterolemia, unspecified: Secondary | ICD-10-CM

## 2024-05-17 DIAGNOSIS — F32A Depression: Principal | ICD-10-CM

## 2024-05-17 DIAGNOSIS — L719 Rosacea, unspecified: Secondary | ICD-10-CM

## 2024-05-17 DIAGNOSIS — R42 Dizziness and giddiness: Secondary | ICD-10-CM

## 2024-05-17 DIAGNOSIS — E119 Type 2 diabetes mellitus without complications: Secondary | ICD-10-CM

## 2024-05-17 DIAGNOSIS — Z5181 Encounter for therapeutic drug level monitoring: Principal | ICD-10-CM

## 2024-05-17 DIAGNOSIS — I10 Essential (primary) hypertension: Secondary | ICD-10-CM

## 2024-05-17 MED ORDER — WARFARIN 5 MG TABLET
5 | ORAL_TABLET | 4 refills | Status: AC
Start: 2024-05-17 — End: ?

## 2024-05-17 NOTE — Discharge Instructions [18]
 POST-OPERATIVE CATARACT SURGERY INSTRUCTIONSYale Eye CenterEYE DROPS - START TODAY (3 more times each today) Close your eye and wait 5 minutes between each drop.Prednisolone  (white or pink top) (shake well) one drop in Right eye 			4 TIMES A DAY FOR ONE WEEK.Then: 	3 TIMES A DAY FOR ONE WEEK	2 TIMES A DAY FOR ONE WEEK	1 TIME A DAY FOR ONE WEEKMoxifloxacin (tan) - one drop in Right eye 4 times a day until bottle is finished.Ketorolac  (gray) - one drop in the Right eye 4 times a day until bottle is finished.Tylenol - every 4 to 6 hours as needed for discomfort.________________________________________________________________Wash hands before placing drops in eye. Close your eye and wait 5 minutes between each drop.Do not touch or rub eye.Do not lean over with your head below your waist for 2 weeks.Use the shield when you are sleeping for the first week.Do not wear make-up for the next week.No lifting or straining or high-impact sports for one week.No swimming for four weeks after surgery. Avoid tap water /soap around the eye for one week.Mild aching and scratching is normal.Call for significant pain in or around the eye, nausea, vomiting, decreased vision, increased eye redness or swelling, injury to the eye, flashes/floaters/black spots in your vision.Your appointment time for tomorrow at [facility], [address] [place] [id number] Appointments     Provider Department Center  05/19/2024 8:30 AM Meredeth Raisin, MD [facility] [address] [facility] COMP  06/05/2024 11:30 AM Alma Rosina Jansky, APRN [facility] 7B BCD - DD  06/06/2024 3:40 PM Glo Herring, APRN [facility]   06/09/2024 8:45 AM Meredeth, Raisin, MD [facility] at [address] [facility] COMP  07/11/2024 2:30 PM Meredeth, Raisin, MD [facility] at [address] [facility] COMP  [facility] [phone], [phone] SURGICAL CENTER [phone]

## 2024-05-18 ENCOUNTER — Ambulatory Visit: Admit: 2024-05-18 | Payer: PRIVATE HEALTH INSURANCE | Attending: Anesthesiology | Primary: Internal Medicine

## 2024-05-18 ENCOUNTER — Encounter: Admit: 2024-05-18 | Payer: PRIVATE HEALTH INSURANCE | Attending: Ophthalmology | Primary: Internal Medicine

## 2024-05-18 ENCOUNTER — Inpatient Hospital Stay
Admit: 2024-05-18 | Discharge: 2024-05-18 | Payer: PRIVATE HEALTH INSURANCE | Attending: Ophthalmology | Primary: Internal Medicine

## 2024-05-18 DIAGNOSIS — E669 Obesity, unspecified: Secondary | ICD-10-CM

## 2024-05-18 DIAGNOSIS — E1136 Type 2 diabetes mellitus with diabetic cataract: Secondary | ICD-10-CM

## 2024-05-18 DIAGNOSIS — D6862 Lupus anticoagulant syndrome: Secondary | ICD-10-CM

## 2024-05-18 DIAGNOSIS — H25811 Combined forms of age-related cataract, right eye: Principal | ICD-10-CM

## 2024-05-18 DIAGNOSIS — Z885 Allergy status to narcotic agent status: Secondary | ICD-10-CM

## 2024-05-18 DIAGNOSIS — E7800 Pure hypercholesterolemia, unspecified: Secondary | ICD-10-CM

## 2024-05-18 DIAGNOSIS — Z7984 Long term (current) use of oral hypoglycemic drugs: Secondary | ICD-10-CM

## 2024-05-18 DIAGNOSIS — E119 Type 2 diabetes mellitus without complications: Secondary | ICD-10-CM

## 2024-05-18 DIAGNOSIS — Z91040 Latex allergy status: Secondary | ICD-10-CM

## 2024-05-18 DIAGNOSIS — K55039 Acute (reversible) ischemia of large intestine, extent unspecified: Secondary | ICD-10-CM

## 2024-05-18 DIAGNOSIS — Z7901 Long term (current) use of anticoagulants: Secondary | ICD-10-CM

## 2024-05-18 DIAGNOSIS — Z933 Colostomy status: Secondary | ICD-10-CM

## 2024-05-18 DIAGNOSIS — F32A Depression: Principal | ICD-10-CM

## 2024-05-18 DIAGNOSIS — Z888 Allergy status to other drugs, medicaments and biological substances status: Secondary | ICD-10-CM

## 2024-05-18 DIAGNOSIS — I10 Essential (primary) hypertension: Secondary | ICD-10-CM

## 2024-05-18 DIAGNOSIS — Z9851 Tubal ligation status: Secondary | ICD-10-CM

## 2024-05-18 DIAGNOSIS — Z6834 Body mass index (BMI) 34.0-34.9, adult: Secondary | ICD-10-CM

## 2024-05-18 DIAGNOSIS — Z79899 Other long term (current) drug therapy: Secondary | ICD-10-CM

## 2024-05-18 DIAGNOSIS — Z881 Allergy status to other antibiotic agents status: Secondary | ICD-10-CM

## 2024-05-18 DIAGNOSIS — R42 Dizziness and giddiness: Secondary | ICD-10-CM

## 2024-05-18 DIAGNOSIS — K436 Other and unspecified ventral hernia with obstruction, without gangrene: Secondary | ICD-10-CM

## 2024-05-18 DIAGNOSIS — Z794 Long term (current) use of insulin: Secondary | ICD-10-CM

## 2024-05-18 DIAGNOSIS — L719 Rosacea, unspecified: Secondary | ICD-10-CM

## 2024-05-18 MED ORDER — MIDAZOLAM 1 MG/ML INJECTION SOLUTION
1 | INTRAVENOUS | Status: DC | PRN
Start: 2024-05-18 — End: 2024-05-18
  Administered 2024-05-18: 11:00:00 1 mg/mL via INTRAVENOUS

## 2024-05-18 MED ORDER — PREDNISOLONE ACETATE 1 % EYE DROPS,SUSPENSION
1 | Status: CP
Start: 2024-05-18 — End: ?

## 2024-05-18 MED ORDER — LIDOCAINE (PF) 10 MG/ML (1 %) INJECTION SYR/SOL (WRAPPED E-RX)
10 | Status: CP
Start: 2024-05-18 — End: ?

## 2024-05-18 MED ORDER — FENTANYL (PF) 50 MCG/ML (WRAPPED ERX) INJECTION
50 | INTRAVENOUS | Status: DC | PRN
Start: 2024-05-18 — End: 2024-05-18
  Administered 2024-05-18 (×2): 50 ug/mL via INTRAVENOUS

## 2024-05-18 MED ORDER — KETOROLAC 0.5 % EYE DROPS
0.5 | OPHTHALMIC | Status: CP
Start: 2024-05-18 — End: ?
  Administered 2024-05-18 (×3): 0.5 mL via OPHTHALMIC

## 2024-05-18 MED ORDER — PREDNISOLONE ACETATE 1 % EYE DROPS,SUSPENSION
1 | Status: DC | PRN
Start: 2024-05-18 — End: 2024-05-18
  Administered 2024-05-18: 11:00:00 1 % via TOPICAL

## 2024-05-18 MED ORDER — CHLORHEXIDINE GLUCONATE 0.12 % MOUTHWASH
0.12 | Freq: Once | OROMUCOSAL | Status: DC
Start: 2024-05-18 — End: 2024-05-18

## 2024-05-18 MED ORDER — CYCLOPENTOLATE 1 % EYE DROPS
1 | OPHTHALMIC | Status: CP
Start: 2024-05-18 — End: ?
  Administered 2024-05-18 (×3): 1 mL via OPHTHALMIC

## 2024-05-18 MED ORDER — MOXIFLOXACIN 0.5 % EYE DROPS
0.5 | Status: DC | PRN
Start: 2024-05-18 — End: 2024-05-18
  Administered 2024-05-18: 11:00:00 0.5 % via OPHTHALMIC

## 2024-05-18 MED ORDER — KETOROLAC 0.5 % EYE DROPS
0.5 | Status: CP
Start: 2024-05-18 — End: ?

## 2024-05-18 MED ORDER — BALANCED SALT SOLUTION COMBINATION NO.2 INTRAOCULAR IRRIGATION
Status: DC | PRN
Start: 2024-05-18 — End: 2024-05-18
  Administered 2024-05-18 (×2): via OPHTHALMIC

## 2024-05-18 MED ORDER — KETOROLAC 0.5 % EYE DROPS
0.5 | Status: DC | PRN
Start: 2024-05-18 — End: 2024-05-18
  Administered 2024-05-18: 11:00:00 0.5 % via OPHTHALMIC

## 2024-05-18 MED ORDER — CEFUROXIME 1 MG/0.1 ML SODIUM CHLORIDE 0.9% INTRA-OP OPHTHALMIC INJECTION (SIMPLE)
1 | Status: DC | PRN
Start: 2024-05-18 — End: 2024-05-18
  Administered 2024-05-18: 11:00:00 1 mg/0. mL via INTRACAMERAL

## 2024-05-18 MED ORDER — SODIUM CHLORIDE 0.9 % (FLUSH) INJECTION SYRINGE
0.9 | Freq: Three times a day (TID) | INTRAVENOUS | Status: DC
Start: 2024-05-18 — End: 2024-05-18

## 2024-05-18 MED ORDER — SODIUM CHLORIDE 0.9 % (FLUSH) INJECTION SYRINGE
0.9 | INTRAVENOUS | Status: DC | PRN
Start: 2024-05-18 — End: 2024-05-18
  Administered 2024-05-18 (×2): 0.9 mL via INTRAVENOUS

## 2024-05-18 MED ORDER — MIDAZOLAM 1 MG/ML INJECTION SOLUTION
1 | Status: CP
Start: 2024-05-18 — End: ?

## 2024-05-18 MED ORDER — TETRACAINE 0.5 % EYE DROPS
0.5 | Status: DC | PRN
Start: 2024-05-18 — End: 2024-05-18
  Administered 2024-05-18: 11:00:00 0.5 % via TOPICAL

## 2024-05-18 MED ORDER — PHENYLEPHRINE 2.5 % EYE DROPS
2.5 | OPHTHALMIC | Status: CP
Start: 2024-05-18 — End: ?
  Administered 2024-05-18 (×3): 2.5 mL via OPHTHALMIC

## 2024-05-18 MED ORDER — CHONDROITIN-SOD HYALURON 3 %-4 %(0.5 ML)1 %(0.55 ML)INTRAOCULAR SYRING
3 | Status: DC | PRN
Start: 2024-05-18 — End: 2024-05-18
  Administered 2024-05-18: 11:00:00 3 %-4 %(0.5 mL) 1 % (0.55 mL) via INTRAOCULAR

## 2024-05-18 MED ORDER — FENTANYL (PF) 50 MCG/ML (WRAPPED ERX) INJECTION
50 | Status: CP
Start: 2024-05-18 — End: ?

## 2024-05-18 MED ORDER — ONDANSETRON HCL (PF) 4 MG/2 ML INJECTION SOLUTION
4 | INTRAVENOUS | Status: DC | PRN
Start: 2024-05-18 — End: 2024-05-18
  Administered 2024-05-18: 11:00:00 4 via INTRAVENOUS

## 2024-05-18 MED ORDER — PROPARACAINE 0.5 % EYE DROPS
0.5 | Freq: Once | OPHTHALMIC | Status: CP
Start: 2024-05-18 — End: ?
  Administered 2024-05-18: 09:00:00 0.5 mL via OPHTHALMIC

## 2024-05-18 MED ORDER — SODIUM CHLORIDE 0.9 % (FLUSH) INJECTION SYRINGE
0.9 | INTRAVENOUS | Status: DC | PRN
Start: 2024-05-18 — End: 2024-05-18

## 2024-05-18 MED ORDER — ONDANSETRON HCL (PF) 4 MG/2 ML INJECTION SOLUTION
4 | INTRAVENOUS | Status: DC | PRN
Start: 2024-05-18 — End: 2024-05-18

## 2024-05-18 MED ORDER — TROPICAMIDE 1 % EYE DROPS
1 | OPHTHALMIC | Status: CP
Start: 2024-05-18 — End: ?
  Administered 2024-05-18 (×3): 1 mL via OPHTHALMIC

## 2024-05-18 MED ORDER — LIDOCAINE HCL 10 MG/ML (1 %) INJECTION SOLUTION
10 | Status: DC | PRN
Start: 2024-05-18 — End: 2024-05-18
  Administered 2024-05-18: 11:00:00 10 mg/mL (1 %)

## 2024-05-18 MED ORDER — IBUPROFEN 600 MG TABLET
600 | Freq: Once | ORAL | Status: DC | PRN
Start: 2024-05-18 — End: 2024-05-18

## 2024-05-18 MED ORDER — ACETAMINOPHEN 500 MG TABLET
500 | Freq: Once | ORAL | Status: DC | PRN
Start: 2024-05-18 — End: 2024-05-18

## 2024-05-18 MED ORDER — MOXIFLOXACIN 0.5 % EYE DROPS
0.5 | Freq: Once | OPHTHALMIC | Status: CP
Start: 2024-05-18 — End: ?
  Administered 2024-05-18: 09:00:00 0.5 mL via OPHTHALMIC

## 2024-05-18 NOTE — Anesthesia Procedure/Diagnosis Confirmation Note [[id number]]
 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

## 2024-05-18 NOTE — Anesthesia Preprocedure Evaluation [24]
 This is a 74 y.o. female scheduled for RIGHT EYE CATARACT REMOVAL W/INSERTION, LENS (Right).Review of Systems/ Medical History Patient summary, nursing notes, pre-procedure vitals, height, weight and NPO status reviewed.No previous anesthesia concernsAnesthesia Evaluation:   No history of anesthetic complications  Estimated body mass index.05/18/24 : 34.40 kg/m? Last patient weight recorded. 05/18/24 : 74.7 kg Last patient height recorded. 05/18/24 : 4' 10 (1.473 m) CC/HPI: Acute ischemic colitis Lupus anticoagulant disorderVertigoPast Surgical History:  COLOSTOMY CLOSURECardiovascular: Negative   Patient has a history of: hypercholesterolemia and hypertension.   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 She is using insulin .Behavioral/Social/Psychiatric & Syndromes: Negative Patient has depression.Physical ExamCardiovascular:      Rhythm: regularHeart Sounds: S1 present and S2 present.Reason: Comments: Pulmonary:    Patient's breath sounds clear to auscultationReason: Comments: Airway:  Mallampati: IITM distance: >3 FBNeck ROM: fullReason: Comments: Dental:  Documentation is limited to gross visual examination or patient report and does not reflect any prior dental records or associated imaging. Omissions in documentation do not reflect absence of pathology. unremarkable  Anesthesia PlanASA 3 The primary anesthesia plan is  MAC. Perioperative Code Status confirmed: It is my understanding that the patient is currently designated as 'Full Code' and will remain so throughout the perioperative period.Anesthesia informed consent obtained.  Consent obtained from: patientType of Anesthesia informed consent obtained:  E-consentThe post operative pain plan is IV analgesics.Plan discussed with CRNA.Anesthesiologist's Pre Op NoteI personally evaluated and examined the patient prior to the intra-operative phase of care on the day of the procedure.[name]

## 2024-05-18 NOTE — Anesthesia Postprocedure Evaluation [25]
 Anesthesia Post-op NotePatient: Maria RiveraProcedure(s):  Procedure(s) (LRB):RIGHT EYE CATARACT REMOVAL W/INSERTION, LENS (Right) Last Vitals:  I have reviewed the post-operative vital signs as noted in the Epic chart.POSTOP EVALUATION:      Patient Recovery Location:  PACU     Vital Signs Status:  Stable     Patient Participation:  Patient participated     Mental Status:  Awake     Respiratory Status:  Acceptable     Airway Patency:  Patent     Cardiovascular/Hydration Status:  Stable     Pain Management:  Satisfactory to patient     Nausea/Vomiting Status:  Satisfactory to patientThere were no known notable events for this encounter.

## 2024-05-18 NOTE — PACU Transfer of Care [100004]
 Post Anesthesia Transfer of Care NotePatient: Maria RiveraProcedure(s) Performed: Procedure(s) (LRB):RIGHT EYE CATARACT REMOVAL W/INSERTION, LENS (Right)Last Vitals: I have reviewed the post-operative vital signs during the handoff as noted in the Epic chart.POSTOP HANDOFF :      Patient Location:  PACU     Level of Consciousness:  Awake     VS stable since last recorded intra-op set? Yes       Oxygen source: room airPatient co-morbidities, intra-operative course, intake & output and antibiotics as per Anesthesia record were discussed with the RN.

## 2024-05-18 NOTE — Operative Note [1000004]
 DATE OF PROCEDURE/SURGERY:    1/22/2026Operation:Cataract extraction with intraocular lens implantation, Right eyeOPERATOR:Payslee Bateson, M.D.ASSISTANT: Resident: Deatrice Harness, MDANESTHESIOLOGIST:Hernandez, Francis LABOR, MDANESTHESIA:MAC/local (intracameral preserved lidocaine  and topical proparacaine)PREOP DIAGNOSIS:Visually significant cataract Right eyePOSTOP DIAGNOSIS:Visually significant cataract Right eyeESTIMATED BLOOD LOSS :None.  LENS IMPLANT:  DIB00 21.5D, serial number [id number] INDICATIONS:Maria [name] presented with a visually significant cataract in the Right eye with glare to 20/50 causing difficulty with night driving. Therapeutic options were discussed with the patient preoperatively, including a discussion of risks, benefits, and alternatives to surgery.  Informed consent was obtained.  An optical or A-scan biometry was used to take the lens measurements, and a dilated fundus exam was performed within 6 months.COMPLICATIONS:None.PROCEDURE:  The patient was brought to the operating room and placed on the operating table in the supine position.  Topical tetracaine was instilled.  After adequate anesthesia, the patient was prepped and draped in the usual sterile ophthalmic fashion.  A lid speculum was inserted and the microscope was positioned.  A Sideport blade was used to create a paracentesis site and preservative-free lidocaine  1% was instilled into the anterior chamber, followed by viscoelastic.  A clear corneal incision was created using a 2.57mm keratome blade.  A continuous curvilinear capsulorrhexis was then performed.  Hydrodissection and hydrodelineation were performed until the nucleus was noted to rotate freely. Phacoemulsification was performed to remove the nuclear material.  Cortical material was removed with the irrigation-aspiration unit.  Viscoelastic was instilled to open the capsular bag.  A posterior chamber intraocular lens, model above, was inserted and positioned.  Irrigation-aspiration was used to remove all viscoelastic.  Intracameral cefuroxime  (1mg /0.86mL) was injected. Wounds were checked for leakage and confirmed to be secure.  The lid speculum was removed and a shield was placed over the eye after topical antibiotic and steroid drops were administered.  The patient was returned to the recovery room in stable condition, having tolerated the procedure well.  The patient will follow up tomorrow morning or sooner as needed for any subjective deterioration.I was present for the entire surgery and performed the critical portions of the procedure.

## 2024-05-18 NOTE — Brief Op Note [1000000]
 [facility] HealthPatient Name: Maria [name]        [id number] Patient DOB: April 13, 1951     Surgery Date: 1/22/2026Surgeons and Role:   * Colonel Krauser, Harlene, MD - PrimaryAssistant(s):Resident: Deatrice Harness, MDStaff:  Circulator: Rangolam, Calcyan, RNRelief Circulator: Mazzacane, Micah, RNScrub Person: Loreli, ChristinePre-Op Diagnosis: * Cataract od * Procedure(s) and Anesthesia Type:   * RIGHT EYE CATARACT REMOVAL W/INSERTION, LENS - MONITORED ANESTHESIA CAREOperative Findings (enter relevant operative findings; do not refer to an operative report that is not yet transcribed): Cataract odSigns of infection present at the time of surgery at the operative site: None Blood and Blood Products: none                 Drains:  noneImplants: Implant Name Type Inv. Item Serial No. Manufacturer Lot No. LRB No. Used Action LENS TECNIS SIMPLICITY LENS TECNIS EYHANCE U 21.5D - [id number] Implant LENS TECNIS SIMPLICITY LENS TECNIS EYHANCE U 21.5D [id number] [employer] [id number] Right 1 Implanted  Specimens: * No specimens in log * Clinical Staging: NoneEBL: None      Post Operative Diagnosis: * Cataract* [name], MD1/22/202611:07 AM

## 2024-05-18 NOTE — Surgically focused H&P [[id number]]
 Maria [name] is a 74 y.o. female with Visually-significant cataract of the Right EYE. I evaluated Maria [name] in the pre operative area.Past Medical History:	Past Medical History[1]Past Surgical History[2]Family History:Family History Problem Relation Age of Onset  Bladder cancer Mother   Colon cancer Father 47  Diabetes Father   Diabetes Sister   Colon cancer Sister 41  Crohn's disease Sister 61  Colon cancer Brother 39  Colon cancer Paternal Aunt 35  Diabetes Paternal Uncle   Breast cancer Maternal Cousin       2 cousins with breast cancer  Colon cancer Paternal Cousin 51 Current/Pertinent Medications: Medications Taking[3]Allergies:Allergies[4]Review of System: Otherwise negativeVitals:  05/18/24 0909 BP: (!) 151/63 Pulse: 85 Resp: 16 Temp: 97.4 ?F (36.3 ?C) Heart Exam: normal, regular rate and rhythm, and no murmurs, clicks, rubs or gallopsLung Exam: clear to auscultation, no rales, rhonchi or wheezingAssessment/Plan: There are no updates or interval changes in the medical condition. Maria [name] states there are no interval changes since the medical exam. All questions answered in detail to Maria [name]'s satisfaction. She states wants to proceed with the following as planned: Procedure(s) and Anesthesia Type:   * RIGHT EYE CATARACT REMOVAL W/INSERTION, LENS - MONITORED ANESTHESIA CAREisually significant cataract, both eyes - glare to 20/50 ou with difficulty night drivingNo guttae, no pseudo exfoliation, dilates to 6.5 mm OD and 6.0 mm OSR/B/A discussed with patient including but not limited to bleeding, infection, retinal detachment, corneal edema, retinal edema, need for further surgery, loss of vision, loss of eye. Discussed multifocal lens vs monovision, patient elects for monofocal distance understands she will need reading glasses.Not a candidate for Toric LensDiscussed femtosecond Laser, not interestedWill require BSS+ Plan ceiol right eye first left eye 2 weeks later will repeat intraocular lens master after right eye is doneMoving to [place]  in March [place]26  Small perifoveal macular drusen right eye  Dry eyesDiscussed importance of optimizing ocular surface prior to cataract surgery Allergic conjunctivitis ou -  on Pataday , discussed using pataday  consistently and artificial tears PRN. Don't use Visine. 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 - no evidence of retinopathy on [facility] 01/25/2024 Glycemic control emphasized [1] Past Medical History:Diagnosis Date  Acute ischemic colitis (HC Code) 2015  Depression   Diabetes mellitus (HC CODE)   Diabetes mellitus, type II (HC CODE)   Hypercholesteremia   Hypertension   Lupus anticoagulant disorder (HC Code)   Rosacea   Ventral hernia with bowel obstruction   Vertigo  [2] Past Surgical History:Procedure Laterality Date  ABDOMINAL ADHESION SURGERY  2016  BLEPHAROPLASTY Bilateral 05/2016  COLONOSCOPY    COLOSTOMY  05/27/2013  COLOSTOMY CLOSURE    HYSTEROSCOPY W/ POLYPECTOMY  06/18/2020  KNEE LIGAMENT RECONSTRUCTION Right 2004  LAPAROSCOPIC NISSEN FUNDOPLICATION    2000 ?  SINUS SURGERY    THYROID BIOPSY    TUBAL LIGATION    UMBILICAL HERNIA REPAIR    VENTRAL HERNIA REPAIR   [3] Outpatient Medications Marked as Taking for the 05/18/24 encounter [facility] Encounter) Medication Sig Dispense Refill  biotin 1 mg tablet Take 1 tablet (1,000 mcg total) by mouth daily.    cholecalciferol, vitamin D3, 125 mcg (5,000 unit) tablet Take 1 tablet (5,000 Units total) by mouth every other day.    diphenhydrAMINE  (BENADRYL ) 25 mg tablet Take 1 tablet (25 mg total) by mouth as needed.    gabapentin  (NEURONTIN ) 300 mg capsule Take 1 capsule (300 mg total) by mouth nightly.    insulin  glargine (SEMGLEE ,LANTUS ,BASAGLAR ) 100 unit/mL (3  mL) pen Inject 10 Units under the skin nightly.    insulin  lispro (HUMALOG  KWIKPEN INSULIN ) 100 unit/mL pen Inject 2 units with smaller carb meals and 4 units with larger carb meals 15 mL 2  JARDIANCE  25 mg tablet Take 1 tablet (25 mg total) by mouth daily.    losartan  (COZAAR ) 25 mg tablet Take 1 tablet (25 mg total) by mouth daily.    meclizine (ANTIVERT) 12.5 mg tablet Take 1 tablet (12.5 mg total) by mouth daily as needed for Dizziness 30 tablet 1  melatonin 10 mg Tab Take 10 mg by mouth nightly as needed.     multivitamin tablet Take 1 tablet by mouth daily.    ondansetron  (ZOFRAN ) 4 MG tablet Take 1 tablet (4 mg total) by mouth every 8 (eight) hours as needed for nausea.    polyethylene glycol (MIRALAX ) 17 gram packet Take 1 packet (17 g total) by mouth daily. Mix in 8 ounces of water , juice, soda, coffee or tea prior to taking. 14 each 2  pravastatin  (PRAVACHOL ) 80 mg tablet Take 1 tablet (80 mg total) by mouth nightly.    senna (SENOKOT) 8.6 mg tablet Take 2 tablets by mouth nightly.. 30 tablet 11  SITagliptin phosphate (JANUVIA) 100 mg tablet Take 1 tablet (100 mg total) by mouth daily. 90 tablet 3  venlafaxine  (EFFEXOR  XR) 150 mg XR 24 hr extended release capsule Take 1 capsule (150 mg total) by mouth daily.    warfarin (COUMADIN ) 5 mg tablet Take 7.5mg  (one and one-half of 5mg  tablets) daily except 5mg  (one of 5mg  tablet) on Fridays or as directed by the anticoagulation clinic. 150 tablet 3  [DISCONTINUED] warfarin (COUMADIN ) 5 mg tablet Take 1.5 tablets (7.5 mg total) by mouth Daily @1800 .   [4] AllergiesAllergen Reactions  Morphine  Hives and Itching   Pt tolerated morphine  during admission 08/08/16-08/09/16.  Pt premedicated with diphenhydramine   Biaxin  [Clarithromycin]   Latex, Natural Rubber Rash  Mitosol [Mitomycin] Rash  Oxycodone  Rash

## 2024-05-19 ENCOUNTER — Ambulatory Visit: Admit: 2024-05-19 | Payer: PRIVATE HEALTH INSURANCE | Attending: Ophthalmology | Primary: Internal Medicine

## 2024-05-19 ENCOUNTER — Inpatient Hospital Stay: Admit: 2024-05-19 | Discharge: 2024-05-19 | Payer: Medicare (Managed Care) | Primary: Internal Medicine

## 2024-05-19 ENCOUNTER — Encounter: Admit: 2024-05-19 | Payer: PRIVATE HEALTH INSURANCE | Attending: Ophthalmology | Primary: Internal Medicine

## 2024-05-19 ENCOUNTER — Ambulatory Visit: Admit: 2024-05-19 | Payer: Medicare (Managed Care) | Primary: Internal Medicine

## 2024-05-19 ENCOUNTER — Ambulatory Visit: Admit: 2024-05-19 | Payer: Medicare (Managed Care) | Attending: Orthopedic Surgery | Primary: Internal Medicine

## 2024-05-19 DIAGNOSIS — H25812 Combined forms of age-related cataract, left eye: Principal | ICD-10-CM

## 2024-05-19 DIAGNOSIS — F32A Depression: Principal | ICD-10-CM

## 2024-05-19 DIAGNOSIS — M7551 Bursitis of right shoulder: Principal | ICD-10-CM

## 2024-05-19 DIAGNOSIS — E7800 Pure hypercholesterolemia, unspecified: Secondary | ICD-10-CM

## 2024-05-19 DIAGNOSIS — M25511 Pain in right shoulder: Secondary | ICD-10-CM

## 2024-05-19 DIAGNOSIS — L719 Rosacea, unspecified: Secondary | ICD-10-CM

## 2024-05-19 DIAGNOSIS — I10 Essential (primary) hypertension: Secondary | ICD-10-CM

## 2024-05-19 DIAGNOSIS — K436 Other and unspecified ventral hernia with obstruction, without gangrene: Secondary | ICD-10-CM

## 2024-05-19 DIAGNOSIS — E119 Type 2 diabetes mellitus without complications: Secondary | ICD-10-CM

## 2024-05-19 DIAGNOSIS — R42 Dizziness and giddiness: Secondary | ICD-10-CM

## 2024-05-19 DIAGNOSIS — Z961 Presence of intraocular lens: Secondary | ICD-10-CM

## 2024-05-19 DIAGNOSIS — D6862 Lupus anticoagulant syndrome: Secondary | ICD-10-CM

## 2024-05-19 DIAGNOSIS — K55039 Acute (reversible) ischemia of large intestine, extent unspecified: Secondary | ICD-10-CM

## 2024-05-19 MED ORDER — TRIAMCINOLONE ACETONIDE 40 MG/ML SUSPENSION FOR INJECTION
40 | Freq: Once | INTRA_ARTICULAR | Status: CP | PRN
Start: 2024-05-19 — End: ?
  Administered 2024-05-19: 13:00:00 40 mg/mL via INTRA_ARTICULAR

## 2024-05-19 MED ORDER — TRIAMCINOLONE ACETONIDE 40 MG/ML SUSPENSION FOR INJECTION
40 | Status: DC
Start: 2024-05-19 — End: 2024-05-19

## 2024-05-19 NOTE — Progress Notes [1]
 Patient name: Maria RiveraMRN: [id number] Date of birth: May 28, 1952Date of visit: 1/23/2026Provider: [facility]: Right shoulder pain for about 2 weeks.HPI: 74 y.o. right hand dominant female presents for evaluation of atraumatic right shoulder pain that began approximately 2 weeks ago.  The patient states that around the time her pain began, she was moving furniture in her home, but denies specific injury.The patient's pain is intermittent.  It worsens with reaching in front of her body and reaching overhead.  She has tried ibuprofen  for pain with minimal improvement.  She limits use of this medication as it causes significant GI upset.  The patient denies swelling.  She has baseline numbness/tingling to the right hand due to neuropathy.Past Medical History:  has a past medical history of Acute ischemic colitis (HC Code) (2015), Depression, Diabetes mellitus (HC CODE), Diabetes mellitus, type II (HC CODE), Hypercholesteremia, Hypertension, Lupus anticoagulant disorder (HC Code), Rosacea, Ventral hernia with bowel obstruction, and Vertigo.Past Surgical History:  has a past surgical history that includes Knee ligament reconstruction (Right, 2004); Laparoscopic Nissen fundoplication; Umbilical hernia repair; Ventral hernia repair; Tubal ligation; THYROID BIOPSY; Colostomy (05/27/2013); Sinus surgery; Abdominal adhesion surgery (2016); Blepharoplasty (Bilateral, 05/2016); Colostomy closure; Hysteroscopy w/ polypectomy (06/18/2020); Colonoscopy; and Cataract Removal/IOL Implant (Right, 05/18/2024).Family History: family history includes Bladder cancer in her mother; Breast cancer in her maternal cousin; Colon cancer (age of onset: 37) in her paternal cousin; Colon cancer (age of onset: 27) in her father and sister; Colon cancer (age of onset: 18) in her brother; Colon cancer (age of onset: 75) in her paternal aunt; Crohn's disease (age of onset: 82) in her sister; Diabetes in her father, paternal uncle, and sister.Social History was reviewed and is in EPIC chart.Allergies: Morphine ; Biaxin  [clarithromycin]; Latex, natural rubber; Mitosol [mitomycin]; and OxycodoneMedications: has a current medication list which includes the following prescription(s): ketorolac , moxifloxacin , prednisolone  acetate, biotin, blood glucose meter, contour test strips, freestyle libre 3 plus sensor, cholecalciferol (vitamin d3), diphenhydramine , enoxaparin , freestyle libre 3 reader, gabapentin , insulin  glargine, insulin  lispro, nano 2nd gen pen needle, jardiance , fingerstix lancets, losartan , meclizine, melatonin, multivitamin, ondansetron , vitamin k, polyethylene glycol, pravastatin , senna, sitagliptin phosphate, venlafaxine  xr, and warfarin.Review of Systems: I have reviewed the review of systems and it is listed in the clinical support section of the record.Occupation: RetiredPhysical Exam: Vital signs: There were no vitals taken for this visit.Constitutional: The patient is alert, interactive, well-nourished and developed, and in no acute distress.Neurologic: Alert and oriented x4 with a non-antalgic gait.Psychologic: Demonstrates appropriate interactions, mood, and affect.Head: The patient?s head is proportional in size to their body and their face is symmetric.Neck: Midline positioning and full AROM without pain.Skin: The extremities are without evidence of rash, erythema, or trophic changes.Lymph: No generalized lymphedema to the extremities. Cardiovascular: Palpable radial pulses. The extremities are warm and well perfused.Right shoulder:	Scars: NoMuscular atrophy: No	Tenderness to palpation: No 	Range of motion: 		AROM: 160/45/L5 (with pain)		[name]: positive	Cross body adduction: positive	Jobe: negative for pain with 5/5 muscle strength	Resisted external rotation: negative for pain with 5/5 muscle strength	Resisted internal rotation: negative for pain with 5/5 muscle strength	Contralateral shoulder shows no muscular atrophy, full active and passive ROM, and no rotator cuff weakness.Imaging: X-ray imaging from today of the right shoulder demonstrates no evidence of fracture or dislocation.  There is evidence of mild degenerative changes to the acromioclavicular joint.Assessment: 74 y.o. female with suspected subacromial bursitis of the right shoulder.Treatment Plan: Clinical assessment and imaging was reviewed with the patient.Based on above assessment the patient's treatment options include a subacromial CSI to try and decrease  inflammation and improve pain symptoms.After discussing the treatment options with the patient, they have decided to proceed with a subacromial CSI to the right shoulder.All of the patient's questions have been answered and they are in agreement with this treatment plan.The patient is to follow-up with [facility] or Dr. Porter in 6 weeks.Large Joint Injection: R subacromial bursa on 05/19/2024 1:20 PMIndications: painDetails: 22 G needle, posterior approachMedications: 40 mg triamcinolone  acetonide 40 mg/mLProcedure Note: Cortisone injection - Right subacromial spaceAfter additional review of the risks, benefits, and rationale of a cortisone injection, the patient desired to proceed with the injection.The risks discussed include, but are not limited to, infection, bleeding, hematoma formation, neuroma, neuritis, skin dimpling, hyper or hypopigmentation of the skin, and steroid flare.After sterile application of a chlorhexidine  preparation to the posterolateral aspect of the shoulder and the use of ethyl chloride for skin anesthesia, 4 mL of 1% lidocaine  and 1 mL of Kenalog  (40 mg) was injected into the subacromial space of the shoulder. A dressing was applied to the injection site.The patient tolerated the injection well and activity modification for the first week after the injection was reviewed with the patient.Procedure, treatment alternatives, risks and benefits explained, specific risks discussed. Consent was given by the patient. Delon Kast, MSN, APRN, FNP-BCDepartment of Orthopaedics & Rehabilitation - Sports MedicineYale Bj's of Medicine

## 2024-05-19 NOTE — Progress Notes [1]
 [facility] Lens Order Yoshi Vicencio [birth year] [id number] *Cataract*Surgeon: Harlene Pinal, MDLens Selection LEFT EYE J+J DIB00 + 20.5 D, quantity 2

## 2024-05-19 NOTE — Progress Notes [1]
 Status post ceiol right eye 05/18/24 - doing wellVisually significant cataract, left eyee - glare to 20/50 ou with difficulty night drivingNo guttae, no pseudo exfoliation, dilates to 6.5 mm OD and 6.0 mm OSR/B/A discussed with patient including but not limited to bleeding, infection, retinal detachment, corneal edema, retinal edema, need for further surgery, loss of vision, loss of eye. Discussed multifocal lens vs monovision, patient elects for monofocal distance understands she will need reading glasses.Not a candidate for Toric LensDiscussed femtosecond Laser, not interestedWill require BSS+Plan ceiol right eye first left eye 2 weeks later will repeat intraocular lens master after right eye is doneMoving [place]  in March 2026Small perifoveal macular drusen right eyeDry eyesDiscussed importance of optimizing ocular surface prior to cataract surgeryAllergic conjunctivitis ou -  on Pataday , discussed using pataday  consistently and artificial tears PRN. Don't use Visine. 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

## 2024-05-23 ENCOUNTER — Telehealth: Admit: 2024-05-23 | Payer: PRIVATE HEALTH INSURANCE | Attending: Orthopedic Surgery | Primary: Internal Medicine

## 2024-05-23 NOTE — Telephone Encounter [36]
 Copied from CRM #[id number]. Topic: Nursing Triage Note - [facility]>> May 23, 2024  1:34 PM Heather BROCKS wrote:[facility]:  NURSE TRIAGE MESSAGETime of call:   1:34 PMCaller:   [name], SOFIAReceived TC from patient. Seen 05/19/24 for R shoulder pain and received injection. Today reports continued pain in R upper arm and R shoulder. Also with bruising to front of shoulder. On coumadin . No numbness/tingling. Taking tylenol  for pain, no relief. Does not have follow up until March. Phone number confirmed in chart. [facility] Nurse Advisor

## 2024-05-23 NOTE — Telephone Encounter [36]
 Spoke to Maria [name] - she reports some bruising in the right shoulder anteriorly, not quite where the injection was. She does report that she is on warfarin and bruises easily at baseline. She described it as a small area of purple with no warmth, redness, or swelling in the area. She does still report pain - advised her that the steroid can take a week sometimes to help with pain and inflammation. Advised her to ice for 20 minutes a few times per day and let us  know if anything changes in terms of appearance or if pain does not improve by next week. She verbalized understanding.

## 2024-05-26 ENCOUNTER — Telehealth: Admit: 2024-05-26 | Payer: PRIVATE HEALTH INSURANCE | Primary: Internal Medicine

## 2024-05-26 NOTE — Telephone Encounter [36]
 This afternoon I contacted patient, Maria [name] to remind her that she is due for her INR.  I spoke directly with the patient and she stated that with the recent and snow and very cold temperatures, she hasn't left her house in a week.  She stated that she had just been talking to her husband about needing a current INR.  Patient states that she will try to get to the lab early next week, hoping that the outside temps increase.  Last documented INR is from 1/9.  This is the first reminder phone call.Tully GORMAN Running, CPHT1/30/202612:45 PMSmilow Doctors [facility] [place]

## 2024-05-30 ENCOUNTER — Ambulatory Visit: Admit: 2024-05-30 | Payer: PRIVATE HEALTH INSURANCE | Primary: Internal Medicine

## 2024-05-30 ENCOUNTER — Telehealth: Admit: 2024-05-30 | Payer: PRIVATE HEALTH INSURANCE | Attending: Hematology & Oncology | Primary: Internal Medicine

## 2024-05-30 ENCOUNTER — Inpatient Hospital Stay: Admit: 2024-05-30 | Discharge: 2024-05-31 | Payer: Medicare (Managed Care) | Primary: Internal Medicine

## 2024-05-30 DIAGNOSIS — D6862 Lupus anticoagulant syndrome: Principal | ICD-10-CM

## 2024-05-30 DIAGNOSIS — Z5181 Encounter for therapeutic drug level monitoring: Principal | ICD-10-CM

## 2024-05-30 DIAGNOSIS — Z7901 Long term (current) use of anticoagulants: Secondary | ICD-10-CM

## 2024-05-30 DIAGNOSIS — Z86718 Personal history of other venous thrombosis and embolism: Secondary | ICD-10-CM

## 2024-05-30 DIAGNOSIS — K559 Vascular disorder of intestine, unspecified: Secondary | ICD-10-CM

## 2024-05-30 LAB — PROTIME AND INR
BKR INR: 6.4 — CR (ref 0.90–1.10)
BKR PROTHROMBIN TIME: 61.2 s — ABNORMAL HIGH (ref 9.0–12.0)

## 2024-05-30 NOTE — Progress Notes [1]
 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  The patient was contacted regarding lab drawn INR results.  Patient's INR findings and prescribed dose from last visit:  05/01/2024   4:07 PM 05/05/2024  10:14 AM 05/30/2024   4:17 PM [facility] INR Date 05/01/2024 05/05/2024 05/30/2024 Associated INR 1.91 3.17 6.40 Instructions 7.5 mg every day 5 mg every Fri; 7.5 mg all other days 2/3: Hold; 2/4: Hold; Otherwise 5 mg every Fri; 7.5 mg all other days Sunday Dose - 7.5 mg - Monday Dose 7.5 mg 7.5 mg - Tuesday Dose 7.5 mg 7.5 mg Hold (2/3) Wednesday Dose 7.5 mg 7.5 mg Hold (2/4) Thursday Dose 7.5 mg 7.5 mg - Friday Dose - 5 mg - Saturday Dose - 7.5 mg - Last 7 day dose total 50 mg 52.5 mg 50 mg Next INR Date 05/05/2024 05/23/2024 06/01/2024 The following findings were reported from today's call (no findings if none selected):[]  Missed doses []  Extra doses [x]  Change in medications []  Change in vitamin k rich food intake (green vegetables, herbals, etc) []  Change in alcohol use []  Change in overall health []  Recent hospitalization / emergency department visit []  Upcoming procedure (including dental procedures) []  Other Comments:       Patient has been taking Advil  for shoulder pain The following side effects were reported from today's call (no side effects if none selected):[]  Bleeding [x]  New bruising []  Warfarin-emergency department visit or hospital admission []  Other Comments:   Patient reports bruising  Assessment/Plan:Patient's INR is 6.4, which is above goal range of 2.0-3.0.  Patient has been taking Advil  for shoulder pain, patient verbalized understanding that this the reason her INR has been elevated over the past month and will not take Advil  anymore. She will call orthopaedics for a pain killer other than Tylenol  as it does not provide relief. Besides bruising at injection sites the patient reports no other signs or symptoms of bleeding and will go to the ER if she experiences any .  She was instructed to hold 2 dose(s) as indicated in the maintenance plan below.  Next INR assessment due 06/01/24. This information was routed to the covering provider for review.Anticoagulation Summary  As of 05/30/2024  INR goal:  2.0-3.0 TTR:  48.8% (9.7 y) INR used for dosing:  6.40 (05/30/2024) Warfarin maintenance plan:  5 mg (5 mg x 1) every Fri; 7.5 mg (5 mg x 1.5) all other days Weekly warfarin total:  50 mg Plan last modified:  Lucienne Coy, 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  Electronically Signed by Coy Lucienne, RPH, May 30, 2024 [facility]  [address] [place], [place] [address]  Phone: [phone]  Fax: [phone]

## 2024-05-30 NOTE — Telephone Encounter [36]
 I did take a call from [facility] lab for a critical INR of 6.40. It is noted that the INR team has already noted this result and is addressing with pt.

## 2024-06-01 ENCOUNTER — Ambulatory Visit: Admit: 2024-06-01 | Payer: PRIVATE HEALTH INSURANCE | Primary: Internal Medicine

## 2024-06-01 ENCOUNTER — Inpatient Hospital Stay: Admit: 2024-06-01 | Payer: Medicare (Managed Care) | Primary: Internal Medicine

## 2024-06-01 DIAGNOSIS — K559 Vascular disorder of intestine, unspecified: Secondary | ICD-10-CM

## 2024-06-01 DIAGNOSIS — Z86718 Personal history of other venous thrombosis and embolism: Secondary | ICD-10-CM

## 2024-06-01 DIAGNOSIS — D6862 Lupus anticoagulant syndrome: Principal | ICD-10-CM

## 2024-06-01 DIAGNOSIS — Z5181 Encounter for therapeutic drug level monitoring: Principal | ICD-10-CM

## 2024-06-01 LAB — PROTIME AND INR
BKR INR: 2.57 — ABNORMAL HIGH (ref 0.90–1.10)
BKR PROTHROMBIN TIME: 26.1 s — ABNORMAL HIGH (ref 9.0–12.0)

## 2024-06-01 NOTE — Progress Notes [1]
 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  The patient was contacted regarding lab drawn INR results.  Patient's INR findings and prescribed dose from last visit:  05/05/2024  10:14 AM 05/30/2024   4:17 PM 06/01/2024   3:39 PM [facility] INR Date 05/05/2024 05/30/2024 06/01/2024 Associated INR 3.17 6.40 2.57 Instructions 5 mg every Fri; 7.5 mg all other days 2/3: Hold; 2/4: Hold; Otherwise 5 mg every Fri; 7.5 mg all other days 5 mg every Fri; 7.5 mg all other days Sunday Dose 7.5 mg - 7.5 mg Monday Dose 7.5 mg - 7.5 mg Tuesday Dose 7.5 mg Hold (2/3) 7.5 mg Wednesday Dose 7.5 mg Hold (2/4) 7.5 mg Thursday Dose 7.5 mg - 7.5 mg Friday Dose 5 mg - 5 mg Saturday Dose 7.5 mg - 7.5 mg Last 7 day dose total 52.5 mg 50 mg 35 mg Next INR Date 05/23/2024 06/01/2024 06/08/2024 The following findings were reported from today's call (no findings if none selected):[]  Missed doses []  Extra doses []  Change in medications []  Change in vitamin k rich food intake (green vegetables, herbals, etc) []  Change in alcohol use []  Change in overall health []  Recent hospitalization / emergency department visit [x]  Upcoming procedure (including dental procedures) []  Other Comments:       Patient having cataract surgery 2/12 The following side effects were reported from today's call (no side effects if none selected):[]  Bleeding []  New bruising []  Warfarin-emergency department visit or hospital admission []  Other Comments:   No side effects Assessment/Plan:Patient's INR is 2.6, which is within goal range of 2.0-3.0.  Patient verbalized understanding that she can no longer take Advil .  She was instructed to continue current weekly dose as indicated in the maintenance plan below.  Next INR assessment due 1 week.  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  Electronically Signed by Coy Lucienne, RPH, June 01, 2024 [facility] Anticoagulation [facility]  [address] [place], [place] [address]  Phone: [phone]  Fax: [phone]

## 2024-06-05 ENCOUNTER — Encounter: Admit: 2024-06-05 | Payer: PRIVATE HEALTH INSURANCE | Primary: Internal Medicine

## 2024-06-09 ENCOUNTER — Encounter: Admit: 2024-06-09 | Payer: PRIVATE HEALTH INSURANCE | Attending: Ophthalmology | Primary: Internal Medicine

## 2024-06-29 ENCOUNTER — Encounter: Admit: 2024-06-29 | Payer: PRIVATE HEALTH INSURANCE | Attending: Orthopedic Surgery | Primary: Internal Medicine

## 2024-07-07 ENCOUNTER — Ambulatory Visit: Admit: 2024-07-07 | Payer: PRIVATE HEALTH INSURANCE | Primary: Internal Medicine

## 2024-07-07 ENCOUNTER — Ambulatory Visit: Admit: 2024-07-07 | Payer: PRIVATE HEALTH INSURANCE | Attending: Oncology | Primary: Internal Medicine

## 2024-07-11 ENCOUNTER — Encounter: Admit: 2024-07-11 | Payer: PRIVATE HEALTH INSURANCE | Attending: Ophthalmology | Primary: Internal Medicine
